# Patient Record
Sex: Female | Born: 1950 | Race: White | Hispanic: No | Marital: Married | Smoking: Never smoker
Health system: Southern US, Community
[De-identification: ages and names within clinical notes are randomized; demographics above are authoritative.]

## PROBLEM LIST (undated history)

## (undated) DIAGNOSIS — F419 Anxiety disorder, unspecified: Secondary | ICD-10-CM

## (undated) DIAGNOSIS — R112 Nausea with vomiting, unspecified: Secondary | ICD-10-CM

## (undated) DIAGNOSIS — Z9889 Other specified postprocedural states: Secondary | ICD-10-CM

## (undated) DIAGNOSIS — H269 Unspecified cataract: Secondary | ICD-10-CM

## (undated) DIAGNOSIS — N979 Female infertility, unspecified: Secondary | ICD-10-CM

## (undated) DIAGNOSIS — M170 Bilateral primary osteoarthritis of knee: Secondary | ICD-10-CM

## (undated) DIAGNOSIS — T7840XA Allergy, unspecified, initial encounter: Secondary | ICD-10-CM

## (undated) DIAGNOSIS — N950 Postmenopausal bleeding: Secondary | ICD-10-CM

## (undated) HISTORY — DX: Unspecified cataract: H26.9

## (undated) HISTORY — DX: Anxiety disorder, unspecified: F41.9

## (undated) HISTORY — DX: Female infertility, unspecified: N97.9

## (undated) HISTORY — DX: Nausea with vomiting, unspecified: R11.2

## (undated) HISTORY — DX: Nausea with vomiting, unspecified: Z98.890

## (undated) HISTORY — PX: ENDOMETRIAL BIOPSY: SHX622

## (undated) HISTORY — DX: Bilateral primary osteoarthritis of knee: M17.0

## (undated) HISTORY — DX: Postmenopausal bleeding: N95.0

## (undated) HISTORY — DX: Allergy, unspecified, initial encounter: T78.40XA

---

## 1996-09-05 HISTORY — PX: FOOT SURGERY: SHX648

## 1999-08-06 HISTORY — PX: REFRACTIVE SURGERY: SHX103

## 1999-10-06 ENCOUNTER — Other Ambulatory Visit: Admission: RE | Admit: 1999-10-06 | Discharge: 1999-10-06 | Payer: Self-pay | Admitting: *Deleted

## 2001-02-09 ENCOUNTER — Other Ambulatory Visit: Admission: RE | Admit: 2001-02-09 | Discharge: 2001-02-09 | Payer: Self-pay | Admitting: Family Medicine

## 2003-01-21 ENCOUNTER — Other Ambulatory Visit: Admission: RE | Admit: 2003-01-21 | Discharge: 2003-01-21 | Payer: Self-pay | Admitting: Family Medicine

## 2004-03-24 ENCOUNTER — Other Ambulatory Visit: Admission: RE | Admit: 2004-03-24 | Discharge: 2004-03-24 | Payer: Self-pay | Admitting: Obstetrics and Gynecology

## 2004-06-24 ENCOUNTER — Ambulatory Visit: Payer: Self-pay

## 2004-09-05 HISTORY — PX: FACIAL COSMETIC SURGERY: SHX629

## 2004-11-12 ENCOUNTER — Ambulatory Visit: Payer: Self-pay | Admitting: Family Medicine

## 2004-11-18 ENCOUNTER — Ambulatory Visit: Payer: Self-pay | Admitting: Otolaryngology

## 2005-03-15 ENCOUNTER — Ambulatory Visit: Payer: Self-pay | Admitting: Family Medicine

## 2005-04-05 ENCOUNTER — Ambulatory Visit: Payer: Self-pay | Admitting: Family Medicine

## 2005-07-29 ENCOUNTER — Ambulatory Visit: Payer: Self-pay | Admitting: Family Medicine

## 2006-05-01 ENCOUNTER — Ambulatory Visit: Payer: Self-pay | Admitting: Family Medicine

## 2006-05-10 ENCOUNTER — Encounter: Payer: Self-pay | Admitting: Family Medicine

## 2006-05-10 ENCOUNTER — Ambulatory Visit: Payer: Self-pay | Admitting: Family Medicine

## 2006-05-10 ENCOUNTER — Other Ambulatory Visit: Admission: RE | Admit: 2006-05-10 | Discharge: 2006-05-10 | Payer: Self-pay | Admitting: Family Medicine

## 2006-05-10 LAB — CONVERTED CEMR LAB: Pap Smear: NORMAL

## 2006-07-18 ENCOUNTER — Ambulatory Visit: Payer: Self-pay | Admitting: Family Medicine

## 2006-08-01 ENCOUNTER — Ambulatory Visit: Payer: Self-pay | Admitting: Family Medicine

## 2006-10-03 LAB — FECAL OCCULT BLOOD, GUAIAC: Fecal Occult Blood: NEGATIVE

## 2006-10-04 ENCOUNTER — Ambulatory Visit: Payer: Self-pay | Admitting: Family Medicine

## 2006-10-12 ENCOUNTER — Ambulatory Visit: Payer: Self-pay | Admitting: Family Medicine

## 2007-04-17 ENCOUNTER — Encounter: Payer: Self-pay | Admitting: Family Medicine

## 2007-04-17 DIAGNOSIS — F411 Generalized anxiety disorder: Secondary | ICD-10-CM | POA: Insufficient documentation

## 2007-04-17 DIAGNOSIS — J309 Allergic rhinitis, unspecified: Secondary | ICD-10-CM | POA: Insufficient documentation

## 2007-04-17 DIAGNOSIS — M7730 Calcaneal spur, unspecified foot: Secondary | ICD-10-CM | POA: Insufficient documentation

## 2007-05-17 ENCOUNTER — Other Ambulatory Visit: Admission: RE | Admit: 2007-05-17 | Discharge: 2007-05-17 | Payer: Self-pay | Admitting: Family Medicine

## 2007-05-17 ENCOUNTER — Encounter: Payer: Self-pay | Admitting: Family Medicine

## 2007-05-17 ENCOUNTER — Ambulatory Visit: Payer: Self-pay | Admitting: Family Medicine

## 2007-05-18 LAB — CONVERTED CEMR LAB
BUN: 10 mg/dL (ref 6–23)
Basophils Relative: 0.5 % (ref 0.0–1.0)
Bilirubin, Direct: 0.1 mg/dL (ref 0.0–0.3)
CO2: 28 meq/L (ref 19–32)
Eosinophils Absolute: 0.2 10*3/uL (ref 0.0–0.6)
Eosinophils Relative: 2.3 % (ref 0.0–5.0)
GFR calc Af Amer: 111 mL/min
Glucose, Bld: 103 mg/dL — ABNORMAL HIGH (ref 70–99)
HDL: 38 mg/dL — ABNORMAL LOW (ref >39.0)
Hemoglobin: 14.1 g/dL (ref 12.0–15.0)
Lymphocytes Relative: 27.3 % (ref 12.0–46.0)
MCV: 92.5 fL (ref 78.0–100.0)
Monocytes Absolute: 0.4 10*3/uL (ref 0.2–0.7)
Monocytes Relative: 6.4 % (ref 3.0–11.0)
Neutro Abs: 4.4 10*3/uL (ref 1.4–7.7)
Potassium: 4.3 meq/L (ref 3.5–5.1)
Total Protein: 6.9 g/dL (ref 6.0–8.3)
VLDL: 21 mg/dL (ref 0–40)

## 2007-05-23 ENCOUNTER — Encounter (INDEPENDENT_AMBULATORY_CARE_PROVIDER_SITE_OTHER): Payer: Self-pay | Admitting: *Deleted

## 2007-05-23 LAB — CONVERTED CEMR LAB: Pap Smear: NORMAL

## 2007-08-03 ENCOUNTER — Ambulatory Visit: Payer: Self-pay | Admitting: Family Medicine

## 2007-08-03 ENCOUNTER — Encounter: Payer: Self-pay | Admitting: Family Medicine

## 2007-08-06 ENCOUNTER — Encounter (INDEPENDENT_AMBULATORY_CARE_PROVIDER_SITE_OTHER): Payer: Self-pay | Admitting: *Deleted

## 2008-03-05 LAB — CONVERTED CEMR LAB: Pap Smear: NORMAL

## 2008-03-17 ENCOUNTER — Telehealth: Payer: Self-pay | Admitting: Family Medicine

## 2008-06-06 ENCOUNTER — Ambulatory Visit: Payer: Self-pay | Admitting: Family Medicine

## 2008-06-06 DIAGNOSIS — R739 Hyperglycemia, unspecified: Secondary | ICD-10-CM

## 2008-06-06 DIAGNOSIS — E669 Obesity, unspecified: Secondary | ICD-10-CM | POA: Insufficient documentation

## 2008-06-06 DIAGNOSIS — Z6836 Body mass index (BMI) 36.0-36.9, adult: Secondary | ICD-10-CM

## 2008-06-06 DIAGNOSIS — L708 Other acne: Secondary | ICD-10-CM | POA: Insufficient documentation

## 2008-06-06 DIAGNOSIS — R7303 Prediabetes: Secondary | ICD-10-CM | POA: Insufficient documentation

## 2008-06-06 DIAGNOSIS — E6609 Other obesity due to excess calories: Secondary | ICD-10-CM | POA: Insufficient documentation

## 2008-06-10 LAB — CONVERTED CEMR LAB
AST: 18 units/L (ref 0–37)
Albumin: 4.1 g/dL (ref 3.5–5.2)
BUN: 12 mg/dL (ref 6–23)
Basophils Relative: 0.7 % (ref 0.0–3.0)
Chloride: 101 meq/L (ref 96–112)
Creatinine, Ser: 0.6 mg/dL (ref 0.4–1.2)
Eosinophils Absolute: 0.1 10*3/uL (ref 0.0–0.7)
Eosinophils Relative: 2 % (ref 0.0–5.0)
GFR calc Af Amer: 132 mL/min
GFR calc non Af Amer: 109 mL/min
HCT: 41.2 % (ref 36.0–46.0)
Hemoglobin: 14.8 g/dL (ref 12.0–15.0)
Hgb A1c MFr Bld: 6 % (ref 4.6–6.0)
MCV: 92 fL (ref 78.0–100.0)
Monocytes Absolute: 0.5 10*3/uL (ref 0.1–1.0)
Neutro Abs: 4.5 10*3/uL (ref 1.4–7.7)
RBC: 4.48 M/uL (ref 3.87–5.11)
WBC: 6.9 10*3/uL (ref 4.5–10.5)

## 2008-08-06 ENCOUNTER — Ambulatory Visit: Payer: Self-pay | Admitting: Family Medicine

## 2008-08-06 ENCOUNTER — Encounter: Payer: Self-pay | Admitting: Family Medicine

## 2008-08-08 ENCOUNTER — Encounter: Payer: Self-pay | Admitting: Family Medicine

## 2008-08-12 ENCOUNTER — Encounter (INDEPENDENT_AMBULATORY_CARE_PROVIDER_SITE_OTHER): Payer: Self-pay | Admitting: *Deleted

## 2008-09-05 HISTORY — PX: COLONOSCOPY: SHX174

## 2008-10-06 ENCOUNTER — Ambulatory Visit: Payer: Self-pay | Admitting: Gastroenterology

## 2008-10-06 ENCOUNTER — Encounter: Payer: Self-pay | Admitting: Family Medicine

## 2009-05-06 ENCOUNTER — Ambulatory Visit: Payer: Self-pay | Admitting: Family Medicine

## 2009-05-07 LAB — CONVERTED CEMR LAB
Albumin: 4.3 g/dL (ref 3.5–5.2)
Basophils Relative: 0.7 % (ref 0.0–3.0)
CO2: 29 meq/L (ref 19–32)
Chloride: 106 meq/L (ref 96–112)
Creatinine, Ser: 0.7 mg/dL (ref 0.4–1.2)
Eosinophils Absolute: 0.3 10*3/uL (ref 0.0–0.7)
HCT: 41.7 % (ref 36.0–46.0)
Hemoglobin: 14.6 g/dL (ref 12.0–15.0)
Hgb A1c MFr Bld: 5.8 % (ref 4.6–6.5)
Lymphs Abs: 1.9 10*3/uL (ref 0.7–4.0)
MCHC: 34.9 g/dL (ref 30.0–36.0)
MCV: 93.3 fL (ref 78.0–100.0)
Monocytes Absolute: 0.4 10*3/uL (ref 0.1–1.0)
Neutro Abs: 4.5 10*3/uL (ref 1.4–7.7)
RBC: 4.47 M/uL (ref 3.87–5.11)
Sodium: 142 meq/L (ref 135–145)
Total CHOL/HDL Ratio: 5
Total Protein: 7.2 g/dL (ref 6.0–8.3)
Triglycerides: 130 mg/dL (ref 0.0–149.0)
Vit D, 25-Hydroxy: 30 ng/mL (ref 30–89)

## 2009-05-18 ENCOUNTER — Ambulatory Visit: Payer: Self-pay | Admitting: Family Medicine

## 2009-05-28 ENCOUNTER — Telehealth: Payer: Self-pay | Admitting: Family Medicine

## 2009-08-10 ENCOUNTER — Ambulatory Visit: Payer: Self-pay | Admitting: Family Medicine

## 2009-08-10 ENCOUNTER — Encounter: Payer: Self-pay | Admitting: Family Medicine

## 2009-08-14 ENCOUNTER — Encounter (INDEPENDENT_AMBULATORY_CARE_PROVIDER_SITE_OTHER): Payer: Self-pay | Admitting: *Deleted

## 2009-09-05 HISTORY — PX: OTHER SURGICAL HISTORY: SHX169

## 2010-06-23 ENCOUNTER — Ambulatory Visit: Payer: Self-pay | Admitting: Ophthalmology

## 2010-07-07 ENCOUNTER — Encounter: Payer: Self-pay | Admitting: Family Medicine

## 2010-07-07 ENCOUNTER — Telehealth (INDEPENDENT_AMBULATORY_CARE_PROVIDER_SITE_OTHER): Payer: Self-pay | Admitting: *Deleted

## 2010-07-07 DIAGNOSIS — R5381 Other malaise: Secondary | ICD-10-CM | POA: Insufficient documentation

## 2010-07-07 DIAGNOSIS — R5383 Other fatigue: Secondary | ICD-10-CM | POA: Insufficient documentation

## 2010-07-07 LAB — CONVERTED CEMR LAB
AST: 19 units/L (ref 0–37)
Albumin: 4.2 g/dL (ref 3.5–5.2)
Alkaline Phosphatase: 75 units/L (ref 39–117)
BUN: 13 mg/dL (ref 6–23)
Basophils Absolute: 0 10*3/uL (ref 0.0–0.1)
CO2: 29 meq/L (ref 19–32)
Calcium: 9.5 mg/dL (ref 8.4–10.5)
Creatinine, Ser: 0.7 mg/dL (ref 0.4–1.2)
Eosinophils Absolute: 0.2 10*3/uL (ref 0.0–0.7)
GFR calc non Af Amer: 88.71 mL/min (ref >60)
Glucose, Bld: 95 mg/dL (ref 70–99)
HDL: 43.1 mg/dL (ref >39.00)
Lymphocytes Relative: 29.3 % (ref 12.0–46.0)
MCHC: 34.1 g/dL (ref 30.0–36.0)
Monocytes Relative: 7.7 % (ref 3.0–12.0)
Neutro Abs: 3.9 10*3/uL (ref 1.4–7.7)
Neutrophils Relative %: 60.1 % (ref 43.0–77.0)
Platelets: 241 10*3/uL (ref 150.0–400.0)
RDW: 12.8 % (ref 11.5–14.6)
Total Bilirubin: 0.7 mg/dL (ref 0.3–1.2)
Triglycerides: 188 mg/dL — ABNORMAL HIGH (ref 0.0–149.0)
VLDL: 37.6 mg/dL (ref 0.0–40.0)
Vitamin B-12: 438 pg/mL (ref 211–911)

## 2010-07-08 LAB — CONVERTED CEMR LAB: Vit D, 25-Hydroxy: 40 ng/mL (ref 30–89)

## 2010-07-14 ENCOUNTER — Ambulatory Visit: Payer: Self-pay | Admitting: Family Medicine

## 2010-07-19 ENCOUNTER — Telehealth: Payer: Self-pay | Admitting: Family Medicine

## 2010-07-20 ENCOUNTER — Encounter: Payer: Self-pay | Admitting: Family Medicine

## 2010-08-20 ENCOUNTER — Ambulatory Visit: Payer: Self-pay | Admitting: Family Medicine

## 2010-08-20 ENCOUNTER — Encounter: Payer: Self-pay | Admitting: Family Medicine

## 2010-08-20 LAB — HM MAMMOGRAPHY: HM Mammogram: NORMAL

## 2010-08-23 ENCOUNTER — Encounter: Payer: Self-pay | Admitting: Family Medicine

## 2010-10-05 NOTE — Progress Notes (Signed)
Summary: insurance wont cover tretinoin  Phone Note From Pharmacy   [facility]  [address] [id number]  [employer] Summary of Call: Per [employer], pt's insurance will not cover tretinoin in pt's over 62 years old.  There is no option for a prior auth.  Do you want to change to something else? Initial call taken by: Lowella Petties CMA, AAMA,  July 19, 2010 12:27 PM  Follow-up for Phone Call        I just signed a paper on my desk -- giving phone number to call for prior auth form--I will put it in IN box  let me know  Follow-up by: Judith Part MD,  July 19, 2010 1:34 PM  Additional Follow-up for Phone Call Additional follow up Details #1::        I called [employer] [phone] and spoke with Olegario Messier. She gave me case # [id number] and will fax prior authorization to Dr Milinda Antis.Lewanda Rife LPN  July 19, 2010 3:15 PM   Prior Berkley Harvey form is on your shelf.          Lowella Petties CMA, AAMA  July 19, 2010 4:48 PM     Additional Follow-up for Phone Call Additional follow up Details #2::    form done and in nurse in box  Follow-up by: Judith Part MD,  July 19, 2010 4:51 PM  Additional Follow-up for Phone Call Additional follow up Details #3:: Details for Additional Follow-up Action Taken: Form faxed.      Lowella Petties CMA, AAMA  July 20, 2010 8:27 AM  Prior Berkley Harvey given for tretinoin, approval letter is on your shelf for signature and scanning.  Lowella Petties CMA, AAMA  July 21, 2010 8:24 AM

## 2010-10-05 NOTE — Medication Information (Signed)
Summary: PA and Approval for Tretinoin Cream  PA and Approval for Tretinoin Cream   Imported By: Maryln Gottron 07/30/2010 09:16:37  _____________________________________________________________________  External Attachment:    Type:   Image     Comment:   External Document

## 2010-10-05 NOTE — Progress Notes (Signed)
----   Converted from flag ---- ---- 07/06/2010 7:38 PM, Judith Part MD wrote: please check wellness and lipid and AIC for v70.0 and hyperglycemia - thanks  ---- 07/06/2010 3:42 PM, Mills Koller wrote: This patient is scheduled for CPX with you, I need lab orders with dx, please. Thanks, Terri  labs on Wednesday ------------------------------

## 2010-10-05 NOTE — Assessment & Plan Note (Signed)
Summary: [name]   Vital Signs:  Patient profile:   61 year old female Height:      64 inches Weight:      223.25 pounds BMI:     38.46 Temp:     98.3 degrees F oral Pulse rate:   88 / minute Pulse rhythm:   regular BP sitting:   110 / 70  (left arm) Cuff size:   large  Vitals Entered By: Lewanda Rife LPN (July 14, 2010 2:30 PM) CC: CPX GYN did pap and breast exam   History of Present Illness: here for wellness exam and to review chronic med problems   has been feeling pretty good except for some fatigue  ? if age related or other  labs are ok  does not exercise     wt is up 6 lb with bmi of 38   hx of neg endom bx pap- was this summer july -- was normal  still on the prometrium  mam 12/10  self exam - no lumps at all  Dr Genice Rouge checked breasts too  is on 50,000 vit D weekly   labs  lipids stable with LDL 114  AIC 6.1 (up from 5.8 B12 nl  vit D is 40 other labs nl   nl dexa 07-- that was normal  ca and D--  takes it most of the time   colonosc 2/10 nl  Td04 does not take flu shots   had shingles in the past (no vaccine) -- within 10 years      Allergies (verified): No Known Drug Allergies  Past History:  Past Medical History: Last updated: 10/11/2008 Allergic rhinitis Anxiety post menup bleeding - neg endo bx   GYN- Dr Genice Rouge  GI- Dr Ricki Rodriguez  Past Surgical History: Last updated: 10/11/2008 Caesarean section x 2 Heel spur removal right foot (1998) Laser surgery to eyes (08/1999) Dexa- normal (02/2001) Face lift (2006) Dexa- normal (07/2006) endo bx - neg for ca cells - gyn 2/10 colonoscopy- int hem- re check 10 years  Family History: Last updated: 06/06/2008 Father: Alz Mother: CVA, HTN. OP Siblings: 3 sisters, 2 brothers- one sister with depression and OA, 1 brother with OA and knee replacement sister with TIAs  brother COPD-- smoker  GM stomach ca sister parkinson's sister dementia, bipolar, cva  Social  History: Last updated: 06/06/2008 Marital Status: married Children: 2 Occupation: retired never smoked  occ alcohol   Risk Factors: Smoking Status: never (04/17/2007)  Review of Systems General:  Complains of fatigue; denies chills, fever, loss of appetite, and malaise. Eyes:  Denies blurring and eye irritation. CV:  Denies chest pain or discomfort, palpitations, shortness of breath with exertion, and swelling of feet. Resp:  Denies cough, shortness of breath, and wheezing. GI:  Denies abdominal pain, change in bowel habits, indigestion, and nausea. GU:  Denies dysuria and urinary frequency. MS:  Denies joint pain, joint redness, joint swelling, and cramps. Derm:  Denies itching, lesion(s), poor wound healing, and rash. Neuro:  Denies numbness and tingling. Psych:  Denies anxiety and depression. Endo:  Denies cold intolerance, excessive thirst, excessive urination, and heat intolerance. Heme:  Denies abnormal bruising and bleeding.  Physical Exam  General:  overwt but well appearing Head:  normocephalic, atraumatic, and no abnormalities observed.   Eyes:  vision grossly intact, pupils equal, pupils round, and pupils reactive to light.  no conjunctival pallor, injection or icterus  Ears:  R ear normal.   Nose:  no nasal discharge.  Mouth:  pharynx pink and moist.   Neck:  supple with full rom and no masses or thyromegally, no JVD or carotid bruit  Chest Wall:  No deformities, masses, or tenderness noted. Lungs:  Normal respiratory effort, chest expands symmetrically. Lungs are clear to auscultation, no crackles or wheezes. Heart:  Normal rate and regular rhythm. S1 and S2 normal without gallop, murmur, click, rub or other extra sounds. Abdomen:  Bowel sounds positive,abdomen soft and non-tender without masses, organomegaly or hernias noted.  no renal bruits  Msk:  No deformity or scoliosis noted of thoracic or lumbar spine.  no acute joint changes, full rom of all joints   Pulses:  R and L carotid,radial,femoral,dorsalis pedis and posterior tibial pulses are full and equal bilaterally Extremities:  No clubbing, cyanosis, edema, or deformity noted with normal full range of motion of all joints.   Neurologic:  sensation intact to light touch, gait normal, and DTRs symmetrical and normal.   Skin:  Intact without suspicious lesions or rashes Cervical Nodes:  No lymphadenopathy noted Inguinal Nodes:  No significant adenopathy Psych:  normal affect, talkative and pleasant    Impression & Recommendations:  Problem # 1:  HEALTH MAINTENANCE EXAM (ICD-V70.0) Assessment Comment Only  reviewed health habits including diet, exercise and skin cancer prevention reviewed health maintenance list and family history  disc fatigue in light of need to exercise   Orders: Prescription Created Electronically [phone])  Problem # 2:  ACNE, MILD (ICD-706.1) Assessment: Unchanged  refilled retin A Her updated medication list for this problem includes:    Retin-a 0.025 % Crea (Tretinoin) .Marland Kitchen... Apply to affected area once daily to dry skin - small amount  Orders: Prescription Created Electronically [phone])  Problem # 3:  HYPERGLYCEMIA (ICD-790.29) Assessment: Deteriorated  this is slt worse with obesity and lack of exercise  disc need for wt loss and low glycemic diet   Labs Reviewed: Creat: 0.7 (07/07/2010)     Orders: Prescription Created Electronically [phone])  Complete Medication List: 1)  Ventolin Hfa 108 (90 Base) Mcg/act Aers (Albuterol sulfate) .... 2 puffs up to every 4 hours as needed wheezing 2)  Zyrtec Allergy 10 Mg Tabs (Cetirizine hcl) .... One by mouth dialy prn 3)  Fluoxetine Hcl 40 Mg Caps (Fluoxetine hcl) .Marland Kitchen.. 1 by mouth once daily 4)  Flonase 50 Mcg/act Susp (Fluticasone propionate) .... Use as directed 2 sprays in each nostril once daily 5)  Prometrium 200 Mg Caps (Progesterone micronized) .... One tab every night for 10 days every 3 months (dr  Genice Rouge) 6)  Retin-a 0.025 % Crea (Tretinoin) .... Apply to affected area once daily to dry skin - small amount  Other Orders: Radiology Referral (Radiology)  Anticoagulation Management Assessment/Plan:             Patient Instructions: 1)  when you are done with your high dose weekly vitamin D-- then take 2000 international units daily of vitamin D3 over the counter 2)  if you are interested in shingles vaccine - call your insurance co first  3)  I will send your records to Dr Genice Rouge  4)  we will schedule mammogram at check out  5)  It is important that you exercise reguarly at least 20 minutes 5 times a week. If you develop chest pain, have severe difficulty breathing, or feel very tired, stop exercising immediately and seek medical attention.  6)  weight loss will help sugar down and reduce risk of diabetes  Prescriptions: RETIN-A 0.025 % CREA (  TRETINOIN) apply to affected area once daily to dry skin - small amount  #1 medium x 2   Entered and Authorized by:   Judith Part MD   Signed by:   Judith Part MD on 07/14/2010   Method used:   [facility]        [phone][facility] (retail)       [address] [place], [place]  [address]       Ph: [phone] or [phone]       Fax: [phone]   RxID:   [id number] FLUOXETINE HCL 40 MG CAPS (FLUOXETINE HCL) 1 by mouth once daily  #90 x 3   Entered and Authorized by:   Judith Part MD   Signed by:   Judith Part MD on 07/14/2010   Method used:   [facility]ronically to        [facility] [phone]* (retail)       [address] [place], [place]  [address]       Ph: [phone] or [phone]       Fax: [phone]   RxID:   [id number]    Orders Added: 1)  Radiology Referral [Radiology] 2)  Est. Patient 40-64 years [id number] 3)  Prescription Created Electronically [phone]    Current Allergies (reviewed today): No known allergies

## 2010-10-07 NOTE — Letter (Signed)
Summary: Results Follow up Letter  [place] [facility]  [address] [place], [place] [address]   Phone: [phone]  Fax: [phone]    08/23/2010 MRN: [id number]    REIANNA BATDORF [address] [place], [place]  [address]    Dear Ms. Pettus,  The following are the results of your recent test(s):  Test         Result    Pap Smear:        Normal _____  Not Normal _____ Comments: ______________________________________________________ Cholesterol: LDL(Bad cholesterol):         Your goal is less than:         HDL (Good cholesterol):       Your goal is more than: Comments:  ______________________________________________________ Mammogram:        Normal ___X__  Not Normal _____ Comments:Repeat in one year.   ___________________________________________________________________ Hemoccult:        Normal _____  Not normal _______ Comments:    _____________________________________________________________________ Other Tests:    We routinely do not discuss normal results over the telephone.  If you desire a copy of the results, or you have any questions about this information we can discuss them at your next office visit.   Sincerely,    Idamae Schuller Nyquan Selbe,MD  MT/ri

## 2011-06-22 ENCOUNTER — Other Ambulatory Visit: Payer: Self-pay | Admitting: *Deleted

## 2011-06-22 MED ORDER — FLUOXETINE HCL 40 MG PO CAPS: 40.0000 mg | ORAL_CAPSULE | Freq: Every day | ORAL | Status: DC

## 2011-06-22 NOTE — Telephone Encounter (Signed)
Could not find her pharmacy to plug in so Px written for call in  -thanks

## 2011-06-22 NOTE — Telephone Encounter (Signed)
Pt is asking for enough refills to last until her appt on 1/22.  [facility] [address]

## 2011-06-22 NOTE — Telephone Encounter (Signed)
Medication [facility] as instructed.Patient notified as instructed by telephone.

## 2011-07-26 ENCOUNTER — Other Ambulatory Visit: Payer: Self-pay

## 2011-07-26 MED ORDER — FLUTICASONE PROPIONATE 50 MCG/ACT NA SUSP: NASAL | Status: DC

## 2011-07-26 NOTE — Telephone Encounter (Signed)
[facility] [address] [facility] refill request Fluticasone prop 50 mcg #16 gm 0. Pt already scheduled appt 07/28/11.

## 2011-09-19 ENCOUNTER — Telehealth (INDEPENDENT_AMBULATORY_CARE_PROVIDER_SITE_OTHER): Payer: BC Managed Care – PPO | Admitting: Family Medicine

## 2011-09-19 DIAGNOSIS — Z0000 Encounter for general adult medical examination without abnormal findings: Secondary | ICD-10-CM

## 2011-09-19 DIAGNOSIS — R7309 Other abnormal glucose: Secondary | ICD-10-CM

## 2011-09-19 NOTE — Telephone Encounter (Signed)
Message copied by Judy Pimple on Mon Sep 19, 2011  8:48 PM ------      Message from: Alvina Chou      Created: Mon Sep 19, 2011  8:32 AM      Regarding: labs for 09-20-11       Patient is scheduled for CPX labs, please order future labs, Thanks , Camelia Eng

## 2011-09-20 ENCOUNTER — Other Ambulatory Visit: Payer: BC Managed Care – PPO

## 2011-09-20 LAB — CBC WITH DIFFERENTIAL/PLATELET
Eosinophils Relative: 2.5 % (ref 0.0–5.0)
HCT: 39.8 % (ref 36.0–46.0)
Hemoglobin: 14 g/dL (ref 12.0–15.0)
Lymphocytes Relative: 22.4 % (ref 12.0–46.0)
Lymphs Abs: 1.8 10*3/uL (ref 0.7–4.0)
Monocytes Relative: 6.6 % (ref 3.0–12.0)
Platelets: 258 10*3/uL (ref 150.0–400.0)
WBC: 8.1 10*3/uL (ref 4.5–10.5)

## 2011-09-20 LAB — COMPREHENSIVE METABOLIC PANEL
Albumin: 4.2 g/dL (ref 3.5–5.2)
CO2: 25 mEq/L (ref 19–32)
Calcium: 9.1 mg/dL (ref 8.4–10.5)
Chloride: 103 mEq/L (ref 96–112)
GFR: 103.32 mL/min (ref >60.00)
Glucose, Bld: 101 mg/dL — ABNORMAL HIGH (ref 70–99)
Sodium: 138 mEq/L (ref 135–145)
Total Bilirubin: 0.6 mg/dL (ref 0.3–1.2)
Total Protein: 7.1 g/dL (ref 6.0–8.3)

## 2011-09-20 LAB — TSH: TSH: 1.26 u[IU]/mL (ref 0.35–5.50)

## 2011-09-20 LAB — LIPID PANEL
LDL Cholesterol: 105 mg/dL — ABNORMAL HIGH (ref 0–99)
Total CHOL/HDL Ratio: 4
Triglycerides: 95 mg/dL (ref 0.0–149.0)

## 2011-09-26 ENCOUNTER — Encounter: Payer: Self-pay | Admitting: Family Medicine

## 2011-09-27 ENCOUNTER — Encounter: Payer: Self-pay | Admitting: Family Medicine

## 2011-09-27 ENCOUNTER — Ambulatory Visit (INDEPENDENT_AMBULATORY_CARE_PROVIDER_SITE_OTHER): Payer: BC Managed Care – PPO | Admitting: Family Medicine

## 2011-09-27 VITALS — BP 138/80 | HR 68 | Temp 98.2°F | Ht 64.0 in | Wt 216.2 lb

## 2011-09-27 DIAGNOSIS — Z1231 Encounter for screening mammogram for malignant neoplasm of breast: Secondary | ICD-10-CM | POA: Insufficient documentation

## 2011-09-27 DIAGNOSIS — Z23 Encounter for immunization: Secondary | ICD-10-CM

## 2011-09-27 DIAGNOSIS — E669 Obesity, unspecified: Secondary | ICD-10-CM

## 2011-09-27 DIAGNOSIS — Z0000 Encounter for general adult medical examination without abnormal findings: Secondary | ICD-10-CM

## 2011-09-27 DIAGNOSIS — R7309 Other abnormal glucose: Secondary | ICD-10-CM

## 2011-09-27 MED ORDER — FLUTICASONE PROPIONATE 50 MCG/ACT NA SUSP: NASAL | Status: DC

## 2011-09-27 MED ORDER — ALBUTEROL SULFATE HFA 108 (90 BASE) MCG/ACT IN AERS: 2.0000 | INHALATION_SPRAY | RESPIRATORY_TRACT | Status: DC | PRN

## 2011-09-27 MED ORDER — TRETINOIN 0.025 % EX CREA: TOPICAL_CREAM | Freq: Every day | CUTANEOUS | Status: DC

## 2011-09-27 MED ORDER — FLUOXETINE HCL 40 MG PO CAPS: 40.0000 mg | ORAL_CAPSULE | Freq: Every day | ORAL | Status: DC

## 2011-09-27 NOTE — Assessment & Plan Note (Signed)
a1c stable at 6.0- suspect wt loss and exercise will lower it further Commended

## 2011-09-27 NOTE — Assessment & Plan Note (Signed)
Doing weight watchers and using gym with trainer 10 lb so far Commended Enc to keep going  Disc health benefits of wt loss

## 2011-09-27 NOTE — Patient Instructions (Signed)
If you are interested in a shingles/zoster vaccine - call your insurance to check on coverage,( you should not get it within 1 month of other vaccines) , then call us for a prescription  for it to take to a pharmacy that gives the shot or call to schedule it here We will schedule mammogram at check out Flu shot today

## 2011-09-27 NOTE — Assessment & Plan Note (Signed)
Reviewed health habits including diet and exercise and skin cancer prevention Also reviewed health mt list, fam hx and immunizations  Flu shot today Enc to consider zostavax also

## 2011-09-27 NOTE — Assessment & Plan Note (Signed)
sched mammo Enc monthly self exams and yearly f/u with her gyn

## 2011-09-27 NOTE — Progress Notes (Signed)
Subjective:    Patient ID: Deborah Carey, female    DOB: 04-21-1951, 61 y.o.   MRN: [id number]  HPI Here for health maintenance exam and to review chronic medical problems   Is feeling ok in general  Has osteoarthritis in her knees  Not close to needing knee replacement  Naproxen prn  Sees orthopedics   Obesity - wt is down 7 lb with bmi of 37-- all total lost 10 lb with wt watchers  Diet  Wt watchers  Exercise- working with trainer at [employer]   bp 138/80- borderline  Sees gyn- last exam pap in July  No more bleeding - is off the prometrium   Mam 12/11-needs to get one  Goes to [facility]  No lumps on exam at home or with gyn   Zoster status-- had shingles -- 8 years ago    Flu shot - did not get  Not allergic  Needs to get one   colonosc 2/10- 10 year follow up   dexa 07- normal   Hyperglycemia is ok 6.0 a1c Is staying away from sweets    Lab Results  Component Value Date   CHOL 172 09/20/2011   HDL 48.10 09/20/2011   LDLCALC 105* 09/20/2011   TRIG 95.0 09/20/2011   CHOLHDL 4 09/20/2011   pretty good  Patient Active Problem List  Diagnoses  . OBESITY  . ANXIETY  . ALLERGIC RHINITIS  . ACNE, MILD  . HEEL SPUR  . FATIGUE  . HYPERGLYCEMIA  . Routine general medical examination at a health care facility  . Other screening mammogram   Past Medical History  Diagnosis Date  . Allergy     allergic rhinitis  . Anxiety   . Post-menopausal bleeding     neg endo bx.   Past Surgical History  Procedure Date  . Cesarean section   . Foot surgery 1998    rt heel spur removal  . Refractive surgery 08/1999  . Facial cosmetic surgery 2006  . Endometrial biopsy     neg for CA cells   History  Substance Use Topics  . Smoking status: Never Smoker   . Smokeless tobacco: Not on file  . Alcohol Use: Yes     occasional alcohol   Family History  Problem Relation Age of Onset  . Stroke Mother   . Hypertension Mother   . Alzheimer's disease Father   . Depression  Sister   . COPD Brother    No Known Allergies No current outpatient prescriptions on file prior to visit.      Review of Systems Review of Systems  Constitutional: Negative for fever, appetite change, fatigue and unexpected weight change.  Eyes: Negative for pain and visual disturbance.  Respiratory: Negative for cough and shortness of breath.   Cardiovascular: Negative for cp or palpitations    Gastrointestinal: Negative for nausea, diarrhea and constipation.  Genitourinary: Negative for urgency and frequency.  Skin: Negative for pallor or rash   Neurological: Negative for weakness, light-headedness, numbness and headaches.  Hematological: Negative for adenopathy. Does not bruise/bleed easily.  Psychiatric/Behavioral: Negative for dysphoric mood. The patient is not nervous/anxious.          Objective:   Physical Exam  Constitutional: She appears well-developed and well-nourished. No distress.       Obese and well appearing   HENT:  Head: Normocephalic and atraumatic.  Right Ear: External ear normal.  Left Ear: External ear normal.  Nose: Nose normal.  Mouth/Throat: Oropharynx is clear  and moist.  Eyes: Conjunctivae and EOM are normal. Pupils are equal, round, and reactive to light. No scleral icterus.  Neck: Normal range of motion. Neck supple. No JVD present. Carotid bruit is not present. No thyromegaly present.  Cardiovascular: Normal rate, regular rhythm, normal heart sounds and intact distal pulses.   Pulmonary/Chest: Effort normal and breath sounds normal. No respiratory distress. She has no wheezes.  Abdominal: Soft. Bowel sounds are normal. She exhibits no distension, no abdominal bruit and no mass. There is no tenderness.  Musculoskeletal: Normal range of motion. She exhibits no edema and no tenderness.  Lymphadenopathy:    She has no cervical adenopathy.  Neurological: She is alert. She has normal reflexes. No cranial nerve deficit. She exhibits normal muscle tone.  Coordination normal.  Skin: Skin is warm and dry. No rash noted. No erythema. No pallor.  Psychiatric: She has a normal mood and affect.          Assessment & Plan:

## 2011-11-01 ENCOUNTER — Ambulatory Visit: Payer: Self-pay | Admitting: Family Medicine

## 2011-11-01 LAB — HM MAMMOGRAPHY: HM Mammogram: NORMAL

## 2011-11-02 ENCOUNTER — Encounter: Payer: Self-pay | Admitting: Family Medicine

## 2011-11-03 ENCOUNTER — Encounter: Payer: Self-pay | Admitting: Family Medicine

## 2012-10-15 ENCOUNTER — Other Ambulatory Visit: Payer: Self-pay | Admitting: *Deleted

## 2012-10-15 MED ORDER — FLUOXETINE HCL 40 MG PO CAPS: 40.0000 mg | ORAL_CAPSULE | Freq: Every day | ORAL | Status: DC

## 2012-10-16 ENCOUNTER — Other Ambulatory Visit: Payer: Self-pay | Admitting: *Deleted

## 2012-10-31 ENCOUNTER — Other Ambulatory Visit (INDEPENDENT_AMBULATORY_CARE_PROVIDER_SITE_OTHER): Payer: BC Managed Care – PPO

## 2012-10-31 ENCOUNTER — Telehealth: Payer: Self-pay | Admitting: Family Medicine

## 2012-10-31 DIAGNOSIS — Z0000 Encounter for general adult medical examination without abnormal findings: Secondary | ICD-10-CM

## 2012-10-31 DIAGNOSIS — R7309 Other abnormal glucose: Secondary | ICD-10-CM

## 2012-10-31 LAB — COMPREHENSIVE METABOLIC PANEL
ALT: 22 U/L (ref 0–35)
Albumin: 4.1 g/dL (ref 3.5–5.2)
CO2: 28 mEq/L (ref 19–32)
GFR: 89.5 mL/min (ref >60.00)
Glucose, Bld: 104 mg/dL — ABNORMAL HIGH (ref 70–99)
Potassium: 4.5 mEq/L (ref 3.5–5.1)
Sodium: 138 mEq/L (ref 135–145)
Total Protein: 7.1 g/dL (ref 6.0–8.3)

## 2012-10-31 LAB — CBC WITH DIFFERENTIAL/PLATELET
Basophils Absolute: 0 10*3/uL (ref 0.0–0.1)
Eosinophils Relative: 2.5 % (ref 0.0–5.0)
HCT: 40.9 % (ref 36.0–46.0)
Lymphocytes Relative: 22.6 % (ref 12.0–46.0)
Monocytes Relative: 6.7 % (ref 3.0–12.0)
Neutrophils Relative %: 67.6 % (ref 43.0–77.0)
Platelets: 258 10*3/uL (ref 150.0–400.0)
WBC: 7.4 10*3/uL (ref 4.5–10.5)

## 2012-10-31 LAB — HEMOGLOBIN A1C: Hgb A1c MFr Bld: 5.8 % (ref 4.6–6.5)

## 2012-10-31 LAB — LIPID PANEL
HDL: 46.8 mg/dL (ref >39.00)
LDL Cholesterol: 116 mg/dL — ABNORMAL HIGH (ref 0–99)
Total CHOL/HDL Ratio: 4
VLDL: 16.8 mg/dL (ref 0.0–40.0)

## 2012-10-31 LAB — TSH: TSH: 1.22 u[IU]/mL (ref 0.35–5.50)

## 2012-10-31 NOTE — Telephone Encounter (Signed)
Lab order

## 2012-11-05 ENCOUNTER — Ambulatory Visit (INDEPENDENT_AMBULATORY_CARE_PROVIDER_SITE_OTHER): Payer: BC Managed Care – PPO | Admitting: Family Medicine

## 2012-11-05 ENCOUNTER — Encounter: Payer: Self-pay | Admitting: Family Medicine

## 2012-11-05 VITALS — BP 124/76 | HR 78 | Temp 98.8°F | Ht 64.25 in | Wt 213.8 lb

## 2012-11-05 DIAGNOSIS — M1710 Unilateral primary osteoarthritis, unspecified knee: Secondary | ICD-10-CM

## 2012-11-05 DIAGNOSIS — M170 Bilateral primary osteoarthritis of knee: Secondary | ICD-10-CM | POA: Insufficient documentation

## 2012-11-05 DIAGNOSIS — Z23 Encounter for immunization: Secondary | ICD-10-CM

## 2012-11-05 DIAGNOSIS — R7309 Other abnormal glucose: Secondary | ICD-10-CM

## 2012-11-05 DIAGNOSIS — Z0000 Encounter for general adult medical examination without abnormal findings: Secondary | ICD-10-CM

## 2012-11-05 MED ORDER — FLUTICASONE PROPIONATE 50 MCG/ACT NA SUSP: 2.0000 | NASAL | Status: DC | PRN

## 2012-11-05 MED ORDER — FLUOXETINE HCL 40 MG PO CAPS: 40.0000 mg | ORAL_CAPSULE | Freq: Every day | ORAL | Status: DC

## 2012-11-05 NOTE — Assessment & Plan Note (Signed)
Reviewed health habits including diet and exercise and skin cancer prevention Also reviewed health mt list, fam hx and immunizations  Rev wellness labs Pt will schedule own mammo Has gyn care in July Tdap today Will call ins about zostavax

## 2012-11-05 NOTE — Assessment & Plan Note (Signed)
a1c is 5.8-continue to watch Disc imp of low sugar diet / wt loss and low impact exercise

## 2012-11-05 NOTE — Assessment & Plan Note (Signed)
Pt seeing orthopedics Given handicapped form for sticker

## 2012-11-05 NOTE — Progress Notes (Signed)
Subjective:    Patient ID: Deborah Carey, female    DOB: 12-20-1950, 62 y.o.   MRN: [id number]  HPI Here for health maintenance exam and to review chronic medical problems    Overall ok except for her knees- has OA  Worse in cold weather  Has not had injections yet - takes nsaid at times    Wt is down 3 lb with bmi of 6 Has tried to loose weight - eating a healthier diet    Zoster status- is interested in one   Td 5/04-is due for her 10 year shot today   mammo 2/13-has not scheduled yet- she goes to [place]  Self exam-no lumps or changes   Pap 7/13 - sees gyn -no problems  colonosc 2/10- is up to date currently  Lipid Lab Results  Component Value Date   CHOL 180 10/31/2012   CHOL 172 09/20/2011   CHOL 195 07/07/2010   Lab Results  Component Value Date   HDL 46.80 10/31/2012   HDL 40.98 09/20/2011   HDL 11.91 07/07/2010   Lab Results  Component Value Date   LDLCALC 116* 10/31/2012   LDLCALC 105* 09/20/2011   LDLCALC 114* 07/07/2010   Lab Results  Component Value Date   TRIG 84.0 10/31/2012   TRIG 95.0 09/20/2011   TRIG 188.0* 07/07/2010   Lab Results  Component Value Date   CHOLHDL 4 10/31/2012   CHOLHDL 4 09/20/2011   CHOLHDL 5 07/07/2010   No results found for this basename: LDLDIRECT   is a fried food eater and likes red meat -but trying to cut out    Patient Active Problem List  Diagnosis  . OBESITY  . ANXIETY  . ALLERGIC RHINITIS  . ACNE, MILD  . HEEL SPUR  . FATIGUE  . HYPERGLYCEMIA  . Routine general medical examination at a health care facility  . Other screening mammogram   Past Medical History  Diagnosis Date  . Allergy     allergic rhinitis  . Anxiety   . Post-menopausal bleeding     neg endo bx.   Past Surgical History  Procedure Laterality Date  . Cesarean section    . Foot surgery  1998    rt heel spur removal  . Refractive surgery  08/1999  . Facial cosmetic surgery  2006  . Endometrial biopsy      neg for CA cells   History   Substance Use Topics  . Smoking status: Never Smoker   . Smokeless tobacco: Not on file  . Alcohol Use: Yes     Comment: occasional alcohol   Family History  Problem Relation Age of Onset  . Stroke Mother   . Hypertension Mother   . Alzheimer's disease Father   . Depression Sister   . COPD Brother    No Known Allergies Current Outpatient Prescriptions on File Prior to Visit  Medication Sig Dispense Refill  . albuterol (PROVENTIL HFA;VENTOLIN HFA) 108 (90 BASE) MCG/ACT inhaler Inhale 2 puffs into the lungs every 4 (four) hours as needed.  1 Inhaler  1  . Cholecalciferol (VITAMIN D PO) Take by mouth as directed.      Marland Kitchen FLUoxetine (PROZAC) 40 MG capsule Take 1 capsule (40 mg total) by mouth daily.  90 capsule  0   No current facility-administered medications on file prior to visit.      Review of Systems Review of Systems  Constitutional: Negative for fever, appetite change, fatigue and unexpected weight change.  Eyes: Negative  for pain and visual disturbance.  Respiratory: Negative for cough and shortness of breath.   Cardiovascular: Negative for cp or palpitations    Gastrointestinal: Negative for nausea, diarrhea and constipation.  Genitourinary: Negative for urgency and frequency.  Skin: Negative for pallor or rash   MSK pos for knee pain from arthritis  Neurological: Negative for weakness, light-headedness, numbness and headaches.  Hematological: Negative for adenopathy. Does not bruise/bleed easily.  Psychiatric/Behavioral: Negative for dysphoric mood. The patient is not nervous/anxious.         Objective:   Physical Exam  Constitutional: She appears well-developed and well-nourished.  obese and well appearing   HENT:  Head: Normocephalic and atraumatic.  Right Ear: External ear normal.  Left Ear: External ear normal.  Nose: Nose normal.  Mouth/Throat: Oropharynx is clear and moist.  Eyes: Conjunctivae and EOM are normal. Pupils are equal, round, and reactive  to light. Right eye exhibits no discharge. Left eye exhibits no discharge. No scleral icterus.  Neck: Normal range of motion. Neck supple. No JVD present. Carotid bruit is not present. No thyromegaly present.  Cardiovascular: Normal rate, regular rhythm, normal heart sounds and intact distal pulses.  Exam reveals no gallop.   Pulmonary/Chest: Effort normal and breath sounds normal. No respiratory distress. She has no wheezes. She exhibits no tenderness.  Abdominal: Soft. Bowel sounds are normal. She exhibits no distension, no abdominal bruit and no mass. There is no tenderness.  Musculoskeletal: She exhibits no edema and no tenderness.  Lymphadenopathy:    She has no cervical adenopathy.  Neurological: She is alert. She has normal reflexes. No cranial nerve deficit. She exhibits normal muscle tone. Coordination normal.  Skin: Skin is warm and dry. No rash noted. No erythema. No pallor.  Psychiatric: She has a normal mood and affect.          Assessment & Plan:

## 2012-11-05 NOTE — Patient Instructions (Addendum)
If you are interested in a shingles/zoster vaccine - call your insurance to check on coverage,( you should not get it within 1 month of other vaccines) , then call us for a prescription  for it to take to a pharmacy that gives the shot , or make a nurse visit to get it here depending on your coverave   Tdap vaccine today (tetanus shot) Think about water exercise Watch sugar in diet and keep working in weight loss

## 2012-11-13 ENCOUNTER — Ambulatory Visit: Payer: Self-pay | Admitting: Family Medicine

## 2012-11-13 IMAGING — MG MM CAD SCREENING MAMMO
1 series · 5 of 5 positions shown · non-contrast
Comparison: none

REASON FOR EXAM: SCR MAMMO NO ORDER
COMMENTS:

PROCEDURE:     MAM - MAM DGTL SCRN MAM NO ORDER W/CAD  - [DATE]  [DATE]
RESULT:     Bilateral digital screening mammogram with CAD dated [DATE]
Comparison study/studies dated:
[DATE], [DATE], [DATE], [DATE]

[R CC · right · 5 of 5 slices shown]
[im 1/5]
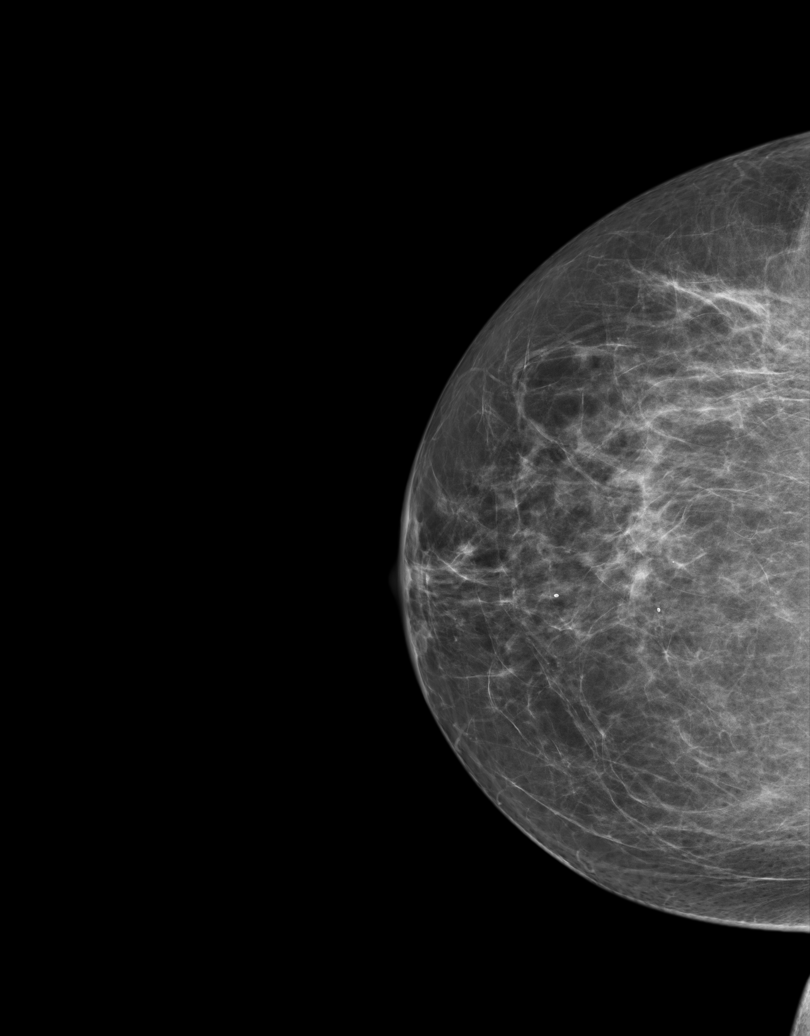
[im 2/5]
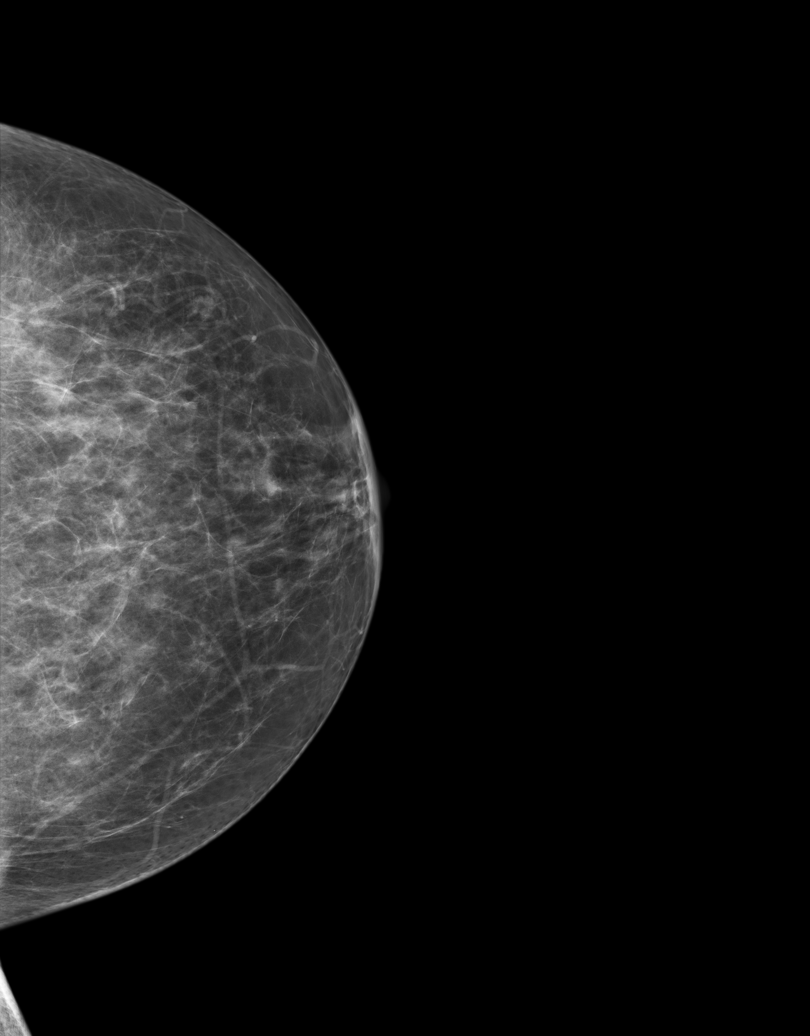
[im 3/5]
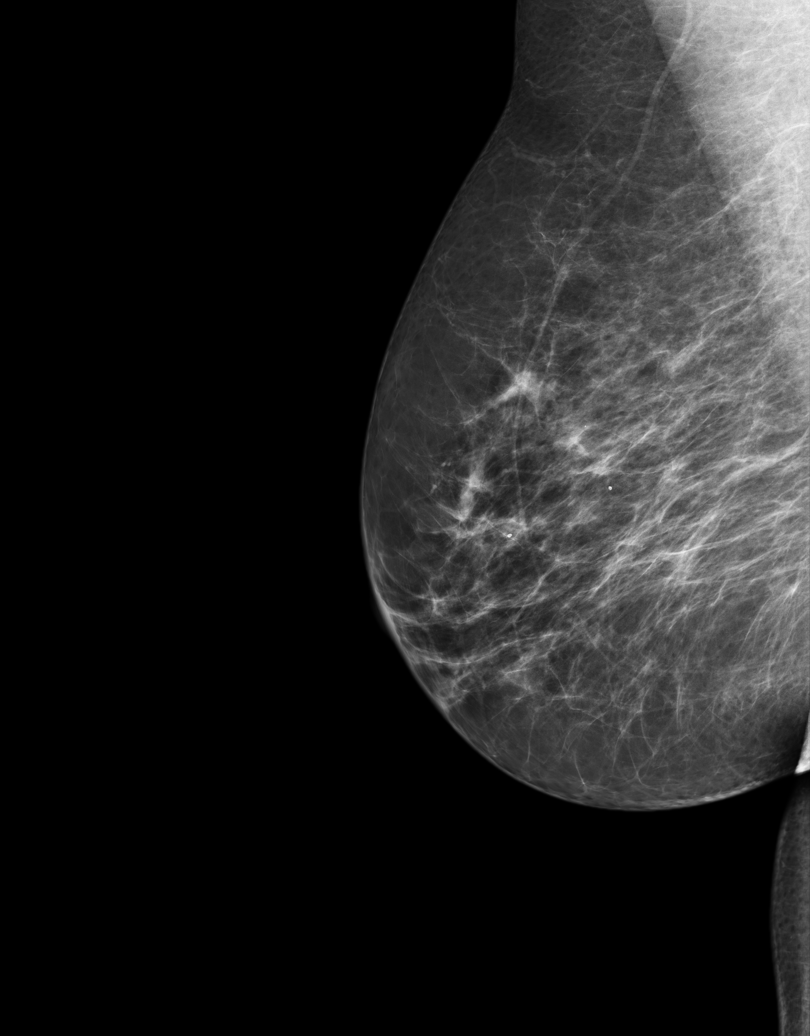
[im 4/5]
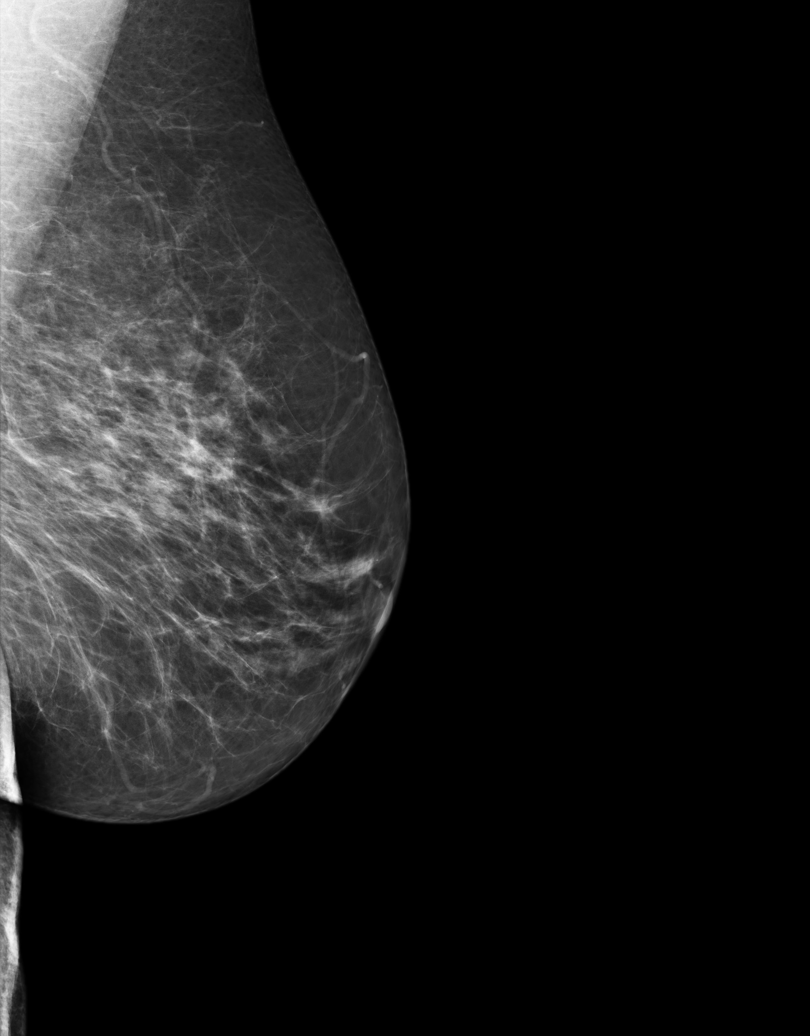
[im 5/5]
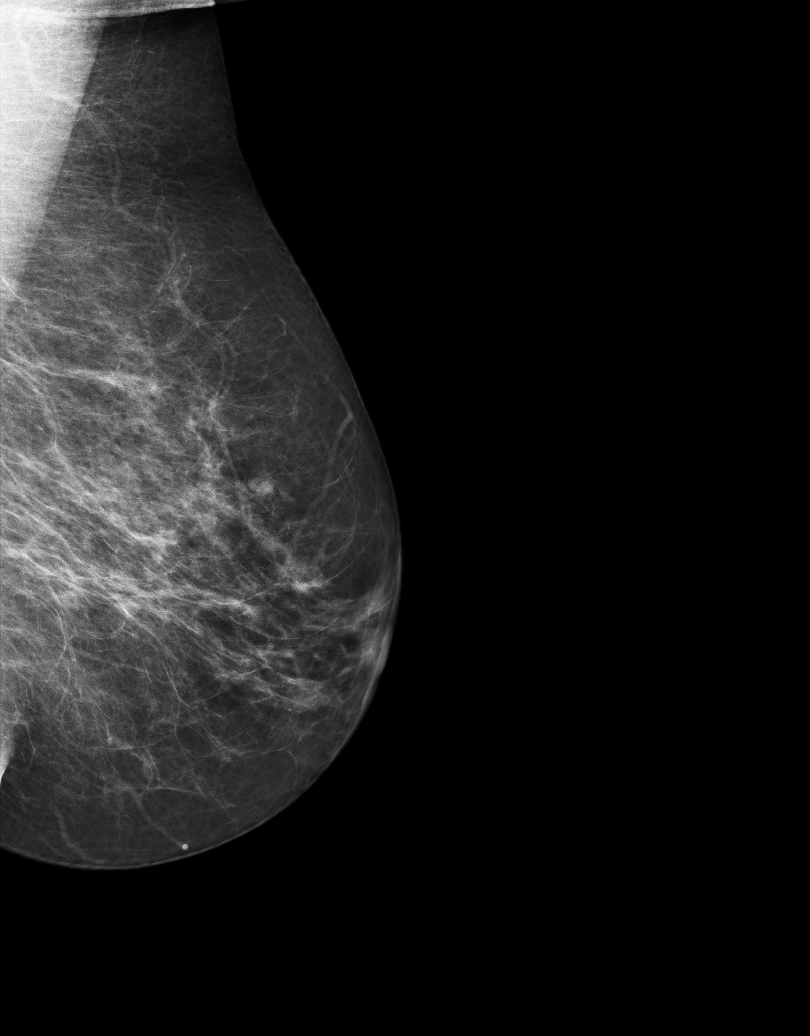

[5 of 5 positions shown; findings below may reference images not displayed]

FINDINGS: BREAST COMPOSITION: The breast composition is SCATTERED FIBROGLANDULAR
TISSUE (glandular tissue is 25-50%)

There is no radiographic evidence of malignant type calcifications ,
architectural distortion, spiculated masses or nodules.
IMPRESSION: BI-RADS: Category 2- Benign Finding

A NEGATIVE MAMMOGRAM REPORT DOES NOT PRECLUDE BIOPSY OR OTHER EVALUATION OF
A CLINICALLY PALPABLE OR OTHERWISE SUSPICIOUS MASS OR LESION. BREAST CANCER
MAY NOT BE DETECTED IN UP TO 10% OF CASES.

## 2012-11-15 ENCOUNTER — Encounter: Payer: Self-pay | Admitting: Family Medicine

## 2012-11-16 ENCOUNTER — Encounter: Payer: Self-pay | Admitting: *Deleted

## 2012-12-24 ENCOUNTER — Telehealth: Payer: Self-pay

## 2012-12-24 NOTE — Telephone Encounter (Signed)
I will write a note - cannot guarantee they will accept it - often health condition has to be much more severe or life threatening to get excused  I will leave note in IN box

## 2012-12-24 NOTE — Telephone Encounter (Signed)
Pt request a letter excusing pt from jury duty. Pt said her arthritis will not allow her to sit for long periods of time without being in a lot of pain.Pt request call back.

## 2012-12-25 NOTE — Telephone Encounter (Signed)
Pt notified letter ready for pick-up but they may not accept it

## 2013-03-18 ENCOUNTER — Encounter: Payer: Self-pay | Admitting: Certified Nurse Midwife

## 2013-03-21 ENCOUNTER — Ambulatory Visit (INDEPENDENT_AMBULATORY_CARE_PROVIDER_SITE_OTHER): Payer: BC Managed Care – PPO | Admitting: Certified Nurse Midwife

## 2013-03-21 ENCOUNTER — Encounter: Payer: Self-pay | Admitting: Certified Nurse Midwife

## 2013-03-21 VITALS — BP 120/82 | HR 60 | Resp 16 | Ht 64.25 in | Wt 223.0 lb

## 2013-03-21 DIAGNOSIS — Z0000 Encounter for general adult medical examination without abnormal findings: Secondary | ICD-10-CM

## 2013-03-21 DIAGNOSIS — Z01419 Encounter for gynecological examination (general) (routine) without abnormal findings: Secondary | ICD-10-CM

## 2013-03-21 LAB — POCT URINALYSIS DIPSTICK
Glucose, UA: NEGATIVE
Nitrite, UA: NEGATIVE
Protein, UA: NEGATIVE
Urobilinogen, UA: NEGATIVE

## 2013-03-21 NOTE — Progress Notes (Signed)
62 y.o. G24P2002 Married Caucasian Fe here for annual exam.  Menopausal no HRT, denies vaginal bleeding. Had recent loss of brother recently to heart attack and sister in hospice with CVD. Aware she has gained weight working on exercise to help with anxiety also.  Treated self with OTC monistat with all symptoms resolved. Using [facility] for vaginal dryness. Sees PCP yearly for medication management. No other health issues today.  Patient's last menstrual period was 09/05/2008.          Sexually active: yes  The current method of family planning is post menopausal status.    Exercising: no  exercise Smoker:  no  Health Maintenance: Pap: 03-19-12 neg HPV HR neg MMG:  3/14 Colonoscopy:  2/11 BMD:   2007 TDaP:  3/14 Labs: poct urine-neg Self breast exam:monthly   reports that she has never smoked. She does not have any smokeless tobacco history on file. She reports that she does not drink alcohol or use illicit drugs.  Past Medical History  Diagnosis Date  . Allergy     allergic rhinitis  . Anxiety   . Post-menopausal bleeding     neg endo bx.  . Infertility, female     Past Surgical History  Procedure Laterality Date  . Foot surgery  1998    rt heel spur removal  . Refractive surgery  08/1999  . Facial cosmetic surgery  2006    face lift  . Endometrial biopsy      neg for CA cells  . Cesarean section      times 2  . Cataract surgery  2011    Current Outpatient Prescriptions  Medication Sig Dispense Refill  . albuterol (PROVENTIL HFA;VENTOLIN HFA) 108 (90 BASE) MCG/ACT inhaler Inhale 2 puffs into the lungs every 4 (four) hours as needed.  1 Inhaler  1  . CALCIUM PO Take by mouth as needed.       . cetirizine (ZYRTEC) 10 MG chewable tablet Chew 10 mg by mouth as needed for allergies.      . Cholecalciferol (VITAMIN D PO) Take by mouth as needed.       Marland Kitchen FLUoxetine (PROZAC) 40 MG capsule Take 1 capsule (40 mg total) by mouth daily.  90 capsule  3  . fluticasone  (FLONASE) 50 MCG/ACT nasal spray Place 2 sprays into the nose as needed.      Marland Kitchen ibuprofen (ADVIL,MOTRIN) 200 MG tablet Take 200 mg by mouth as needed for pain.      . Multiple Vitamins-Minerals (MULTIVITAMIN PO) Take by mouth as needed.       . naproxen sodium (ANAPROX) 220 MG tablet Take 220 mg by mouth as needed.      . Omega-3 Fatty Acids (FISH OIL PO) Take by mouth as needed.        No current facility-administered medications for this visit.    Family History  Problem Relation Age of Onset  . Stroke Mother   . Hypertension Mother   . Osteoporosis Mother   . Alzheimer's disease Father   . Hypertension Father   . Osteoporosis Father   . Depression Sister   . Parkinson's disease Sister   . Hypertension Sister   . Osteoporosis Sister   . Thyroid disease Sister   . COPD Brother     ROS:  Pertinent items are noted in HPI.  Otherwise, a comprehensive ROS was negative.  Exam:   BP 120/82  Pulse 60  Resp 16  Ht 5' 4.25" (1.632  m)  Wt 223 lb (101.152 kg)  BMI 37.98 kg/m2  LMP 09/05/2008 Height: 5' 4.25" (163.2 cm)  Ht Readings from Last 3 Encounters:  03/21/13 5' 4.25" (1.632 m)  11/05/12 5' 4.25" (1.632 m)  09/27/11 5\' 4"  (1.626 m)    General appearance: alert, cooperative and appears stated age Head: Normocephalic, without obvious abnormality, atraumatic Neck: no adenopathy, supple, symmetrical, trachea midline and thyroid normal to inspection and palpation Lungs: clear to auscultation bilaterally Breasts: normal appearance, no masses or tenderness, No nipple retraction or dimpling, No nipple discharge or bleeding, No axillary or supraclavicular adenopathy Heart: regular rate and rhythm Abdomen: soft, non-tender; no masses,  no organomegaly Extremities: extremities normal, atraumatic, no cyanosis or edema Skin: Skin color, texture, turgor normal. No rashes or lesions Lymph nodes: Cervical, supraclavicular, and axillary nodes normal. No abnormal inguinal nodes  palpated Neurologic: Grossly normal   Pelvic: External genitalia:  no lesions              Urethra:  normal appearing urethra with no masses, tenderness or lesions              Bartholin's and Skene's: normal                 Vagina:atrophic appearing vagina with normal color and discharge, no lesions              Cervix: normal, non tender              Pap taken: no Bimanual Exam:  Uterus:  normal size, contour, position, consistency, mobility, non-tender and mid postion              Adnexa: normal adnexa and no mass, fullness, tenderness               Rectovaginal: Confirms               Anus:  normal sphincter tone, no lesions  A:  Well Woman with normal exam  Menopausal no HRT  Anxiety on medication with PCP management  P:   Reviewed health and wellness pertinent to exam  Aware of need for evaluation if vaginal bleeding  Continue follow up with PCp as indicated, encouraged to seek family, friends, and hospice counseling support.  Pap smear as per guidelines   Mammogram yearly pap smear not taken today  counseled on breast self exam, mammography screening, adequate intake of calcium and vitamin D, diet and exercise  return annually or prn  An After Visit Summary was printed and given to the patient.  Reviewed, TL

## 2013-03-21 NOTE — Patient Instructions (Addendum)

## 2013-08-05 ENCOUNTER — Other Ambulatory Visit: Payer: Self-pay | Admitting: *Deleted

## 2013-08-05 NOTE — Telephone Encounter (Signed)
Please refill for 6 mo 

## 2013-08-05 NOTE — Telephone Encounter (Signed)
Received faxed refill request from pharmacy. Last office visit 11/05/12. Is it okay to refill medication?

## 2013-08-06 MED ORDER — ALBUTEROL SULFATE HFA 108 (90 BASE) MCG/ACT IN AERS: 2.0000 | INHALATION_SPRAY | RESPIRATORY_TRACT | Status: DC | PRN

## 2013-08-06 NOTE — Telephone Encounter (Signed)
done

## 2013-09-07 ENCOUNTER — Encounter: Payer: Self-pay | Admitting: Physician Assistant

## 2013-09-07 ENCOUNTER — Ambulatory Visit (INDEPENDENT_AMBULATORY_CARE_PROVIDER_SITE_OTHER): Payer: 59 | Admitting: Physician Assistant

## 2013-09-07 VITALS — BP 128/88 | HR 70 | Temp 98.1°F | Resp 17 | Wt 224.0 lb

## 2013-09-07 DIAGNOSIS — J209 Acute bronchitis, unspecified: Secondary | ICD-10-CM | POA: Insufficient documentation

## 2013-09-07 MED ORDER — AZITHROMYCIN 250 MG PO TABS: ORAL_TABLET | ORAL | Status: DC

## 2013-09-07 MED ORDER — HYDROCOD POLST-CHLORPHEN POLST 10-8 MG/5ML PO LQCR: 5.0000 mL | Freq: Two times a day (BID) | ORAL | Status: DC | PRN

## 2013-09-07 NOTE — Progress Notes (Signed)
Pre visit review using our clinic review tool, if applicable. No additional management support is needed unless otherwise documented below in the visit note. 

## 2013-09-07 NOTE — Progress Notes (Signed)
Patient ID: Deborah Carey, female   DOB: 09/06/1950, 63 y.o.   MRN: [id number]  Patient presents to clinic today complaining of several weeks of cough that is productive of sputum. Patient also endorses fatigue and chest congestion. Denies shortness of breath and wheezing. Denies fever, chills, night sweats or weight loss. Patient does endorse one episode of blood-tinged sputum yesterday after a forceful coughing spell. Denies recurrence.  Patient denies recent travel or sick contact.  Past Medical History  Diagnosis Date  . Allergy     allergic rhinitis  . Anxiety   . Post-menopausal bleeding     neg endo bx.  . Infertility, female   . Osteoarthritis of both knees     Current Outpatient Prescriptions on File Prior to Visit  Medication Sig Dispense Refill  . albuterol (PROVENTIL HFA;VENTOLIN HFA) 108 (90 BASE) MCG/ACT inhaler Inhale 2 puffs into the lungs every 4 (four) hours as needed.  1 Inhaler  5  . CALCIUM PO Take by mouth as needed.       . cetirizine (ZYRTEC) 10 MG chewable tablet Chew 10 mg by mouth as needed for allergies.      . Cholecalciferol (VITAMIN D PO) Take by mouth as needed.       Marland Kitchen FLUoxetine (PROZAC) 40 MG capsule Take 1 capsule (40 mg total) by mouth daily.  90 capsule  3  . fluticasone (FLONASE) 50 MCG/ACT nasal spray Place 2 sprays into the nose as needed.      Marland Kitchen ibuprofen (ADVIL,MOTRIN) 200 MG tablet Take 200 mg by mouth as needed for pain.      . Multiple Vitamins-Minerals (MULTIVITAMIN PO) Take by mouth as needed.       . naproxen sodium (ANAPROX) 220 MG tablet Take 220 mg by mouth as needed.      . Omega-3 Fatty Acids (FISH OIL PO) Take by mouth as needed.        No current facility-administered medications on file prior to visit.    No Known Allergies  Family History  Problem Relation Age of Onset  . Stroke Mother   . Hypertension Mother   . Osteoporosis Mother   . Alzheimer's disease Father   . Hypertension Father   . Osteoporosis Father   .  Depression Sister   . Hypertension Sister   . Osteoporosis Sister   . Thyroid disease Sister   . COPD Brother   . Parkinson's disease Sister   . Osteoporosis Sister     History   Social History  . Marital Status: Married    Spouse Name: N/A    Number of Children: N/A  . Years of Education: N/A   Social History Main Topics  . Smoking status: Never Smoker   . Smokeless tobacco: None  . Alcohol Use: No     Comment: occasional alcohol  . Drug Use: No  . Sexual Activity: Yes    Partners: Male    Birth Control/ Protection: None   Other Topics Concern  . None   Social History Narrative  . None   Review of Systems - See HPI.  All other ROS are negative.  Filed Vitals:   09/07/13 1214  BP: 128/88  Pulse: 70  Temp: 98.1 F (36.7 C)  Resp: 17    Physical Exam  Vitals reviewed. Constitutional: She is oriented to person, place, and time and well-developed, well-nourished, and in no distress.  HENT:  Head: Normocephalic and atraumatic.  Right Ear: External ear normal.  Left Ear:  External ear normal.  Nose: Nose normal.  Mouth/Throat: Oropharynx is clear and moist. No oropharyngeal exudate.  Tympanic membranes within normal limits bilaterally. No tenderness to percussion of sinuses noted on examination.  Eyes: Conjunctivae are normal. Pupils are equal, round, and reactive to light.  Neck: Neck supple.  Cardiovascular: Normal rate, regular rhythm, normal heart sounds and intact distal pulses.   Pulmonary/Chest: Effort normal and breath sounds normal. No respiratory distress. She has no wheezes. She has no rales. She exhibits no tenderness.  Lymphadenopathy:    She has no cervical adenopathy.  Neurological: She is alert and oriented to person, place, and time. No cranial nerve deficit.  Skin: Skin is warm and dry. No rash noted.  Psychiatric: Affect normal.    Assessment/Plan: No problem-specific assessment & plan notes found for this encounter.

## 2013-09-07 NOTE — Patient Instructions (Signed)
Increase fluid intake.  Take antibiotic as prescribed.  Plain Mucinex.  Humidifier in bedroom.  Saline nasal spray.  Continue other medications as prescribed.  Please follow-up with Dr. Alba Cory for repeat examination of thyroid and labs.  If symptoms not improving, please follow-up with PCP for additional workup.

## 2013-09-07 NOTE — Assessment & Plan Note (Signed)
Rx azithromycin. Rx Tussionex for cough. Increase fluid intake. Saline nasal spray. Probiotic. Blood-tinged sputum most likely pseudohemoptysis resulting from dry, irritated upper airways. No increased risk of TB. No increased risk of esophageal varices. Place a humidifier in the bedroom to help with this. Plain Mucinex. Call or return to clinic if symptoms not improving

## 2013-09-13 ENCOUNTER — Other Ambulatory Visit: Payer: Self-pay | Admitting: Physician Assistant

## 2013-09-16 ENCOUNTER — Ambulatory Visit (INDEPENDENT_AMBULATORY_CARE_PROVIDER_SITE_OTHER): Payer: 59 | Admitting: Family Medicine

## 2013-09-16 ENCOUNTER — Encounter: Payer: Self-pay | Admitting: Family Medicine

## 2013-09-16 VITALS — BP 128/74 | HR 83 | Temp 97.9°F | Ht 64.25 in | Wt 220.5 lb

## 2013-09-16 DIAGNOSIS — E049 Nontoxic goiter, unspecified: Secondary | ICD-10-CM

## 2013-09-16 LAB — TSH: TSH: 1.82 u[IU]/mL (ref 0.35–5.50)

## 2013-09-16 LAB — T4, FREE: Free T4: 0.83 ng/dL (ref 0.60–1.60)

## 2013-09-16 NOTE — Assessment & Plan Note (Signed)
Poss R thyroid enlargement  Thyroid US ordered Lab-thyroid panel today

## 2013-09-16 NOTE — Progress Notes (Signed)
Pre-visit discussion using our clinic review tool. No additional management support is needed unless otherwise documented below in the visit note.  

## 2013-09-16 NOTE — Progress Notes (Signed)
Subjective:    Patient ID: Deborah Carey, female    DOB: 03/05/1951, 63 y.o.   MRN: [id number]  HPI Here for f/u of ? Enlarged thyroid   Saw Dr at Sat clinic- for uri  Noted she had thyroid enlargement   She notices some swelling/ fatty area on L bigger than the R over clavicle  She has had no thyroid hx  Sister is hypothyroid- ? Cause   Wt is down a few lb  Has felt keyed up lately - and problems falling asleep  Does not think she is jittery  And for about 6 months - tired  She has noted some trouble swallowing - painful at times   Lab Results  Component Value Date   TSH 1.22 10/31/2012     Patient Active Problem List   Diagnosis Date Noted  . Acute bronchitis 09/07/2013  . Primary osteoarthritis of both knees 11/05/2012  . Other screening mammogram 09/27/2011  . Routine general medical examination at a health care facility 09/19/2011  . OBESITY 06/06/2008  . ACNE, MILD 06/06/2008  . HYPERGLYCEMIA 06/06/2008  . ANXIETY 04/17/2007  . ALLERGIC RHINITIS 04/17/2007  . HEEL SPUR 04/17/2007   Past Medical History  Diagnosis Date  . Allergy     allergic rhinitis  . Anxiety   . Post-menopausal bleeding     neg endo bx.  . Infertility, female   . Osteoarthritis of both knees    Past Surgical History  Procedure Laterality Date  . Foot surgery  1998    rt heel spur removal  . Refractive surgery  08/1999  . Facial cosmetic surgery  2006    face lift  . Endometrial biopsy      neg for CA cells  . Cesarean section      times 2  . Cataract surgery  2011   History  Substance Use Topics  . Smoking status: Never Smoker   . Smokeless tobacco: Not on file  . Alcohol Use: No     Comment: occasional alcohol   Family History  Problem Relation Age of Onset  . Stroke Mother   . Hypertension Mother   . Osteoporosis Mother   . Alzheimer's disease Father   . Hypertension Father   . Osteoporosis Father   . Depression Sister   . Hypertension Sister   . Osteoporosis  Sister   . Thyroid disease Sister   . COPD Brother   . Parkinson's disease Sister   . Osteoporosis Sister    No Known Allergies Current Outpatient Prescriptions on File Prior to Visit  Medication Sig Dispense Refill  . albuterol (PROVENTIL HFA;VENTOLIN HFA) 108 (90 BASE) MCG/ACT inhaler Inhale 2 puffs into the lungs every 4 (four) hours as needed.  1 Inhaler  5  . azithromycin (ZITHROMAX) 250 MG tablet Take 2 tablets on Day 1.  Then take 1 tablet daily.  6 tablet  0  . CALCIUM PO Take by mouth as needed.       . cetirizine (ZYRTEC) 10 MG chewable tablet Chew 10 mg by mouth as needed for allergies.      . chlorpheniramine-HYDROcodone (TUSSIONEX PENNKINETIC ER) 10-8 MG/5ML LQCR Take 5 mLs by mouth every 12 (twelve) hours as needed.  115 mL  0  . Cholecalciferol (VITAMIN D PO) Take by mouth as needed.       Marland Kitchen FLUoxetine (PROZAC) 40 MG capsule Take 1 capsule (40 mg total) by mouth daily.  90 capsule  3  . fluticasone (FLONASE)  50 MCG/ACT nasal spray Place 2 sprays into the nose as needed.      Marland Kitchen ibuprofen (ADVIL,MOTRIN) 200 MG tablet Take 200 mg by mouth as needed for pain.      . Multiple Vitamins-Minerals (MULTIVITAMIN PO) Take by mouth as needed.       . naproxen sodium (ANAPROX) 220 MG tablet Take 220 mg by mouth as needed.      . Omega-3 Fatty Acids (FISH OIL PO) Take by mouth as needed.        No current facility-administered medications on file prior to visit.     Review of Systems Review of Systems  Constitutional: Negative for fever, appetite change, fatigue and unexpected weight change.  Eyes: Negative for pain and visual disturbance.  Respiratory: Negative for cough and shortness of breath.   Cardiovascular: Negative for cp or palpitations    Gastrointestinal: Negative for nausea, diarrhea and constipation. pos for dysphagia  Genitourinary: Negative for urgency and frequency.  Skin: Negative for pallor or rash   Neurological: Negative for weakness, light-headedness, numbness  and headaches. neg for tremor  Hematological: Negative for adenopathy. Does not bruise/bleed easily.  Psychiatric/Behavioral: Negative for dysphoric mood. The patient is not nervous/anxious.         Objective:   Physical Exam  Constitutional: She appears well-developed and well-nourished. No distress.  obese and well appearing    HENT:  Head: Normocephalic and atraumatic.  Mouth/Throat: Oropharynx is clear and moist.  Eyes: Conjunctivae are normal. Pupils are equal, round, and reactive to light. No scleral icterus.  Neck: Normal range of motion. Neck supple. No JVD present. No tracheal deviation present. Thyromegaly present.  Fullness noted over L thyroid area (no bruit or tenderness or discrete nodule palpated)  Cardiovascular: Normal rate and regular rhythm.   Pulmonary/Chest: Effort normal and breath sounds normal.  No crackles   Musculoskeletal: Normal range of motion.  Lymphadenopathy:    She has no cervical adenopathy.  Neurological: She is alert. She has normal reflexes. She displays no tremor.  Skin: Skin is warm and dry. No rash noted. No erythema. No pallor.  Psychiatric: She has a normal mood and affect.          Assessment & Plan:

## 2013-09-16 NOTE — Patient Instructions (Signed)
Labs today for thyroid function Stop up front for a referral for a thyroid ultrasound

## 2013-09-17 LAB — T3 UPTAKE: T3 UPTAKE: 34.7 % (ref 22.5–37.0)

## 2013-09-23 ENCOUNTER — Ambulatory Visit: Payer: Self-pay | Admitting: Family Medicine

## 2013-09-23 IMAGING — US THYROID ULTRASOUND
1 series · 14 of 25 positions shown · non-contrast
Comparison: None.

CLINICAL DATA: Enlarged thyroid

EXAM:
THYROID ULTRASOUND
TECHNIQUE: Ultrasound examination of the thyroid gland and adjacent soft
tissues was performed.

[Series 1: thyroid ultrasound · 0.08mm/px · 14 of 52 slices shown]
[im 1/52]
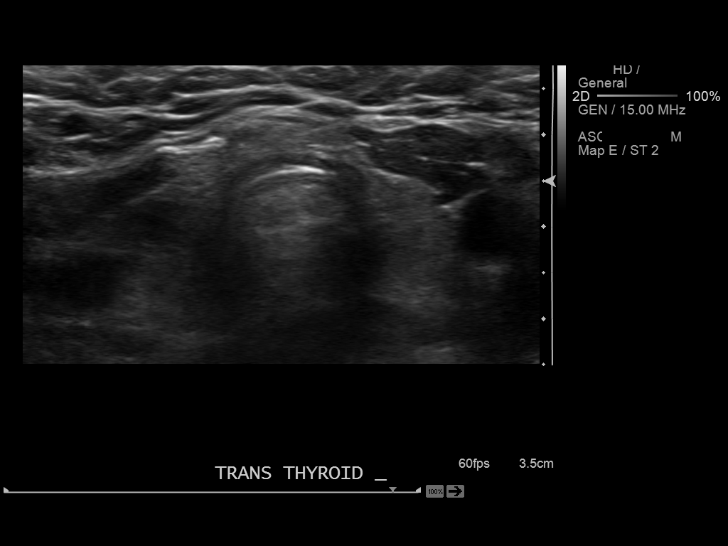
[im 5/52]
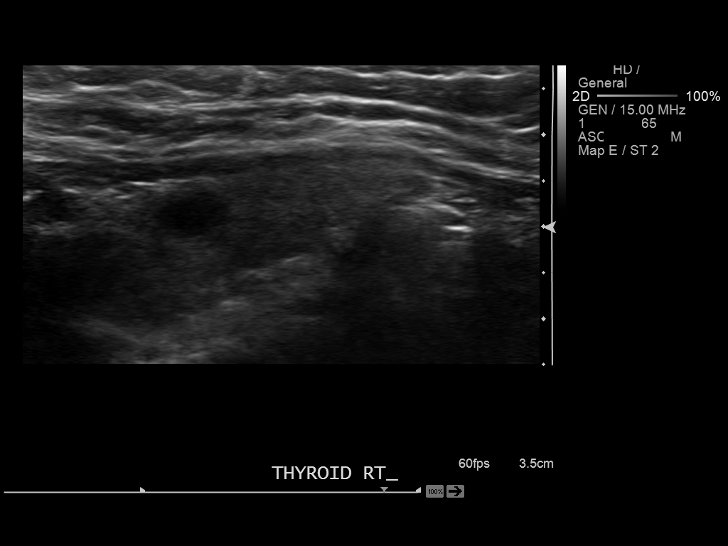
[im 9/52]
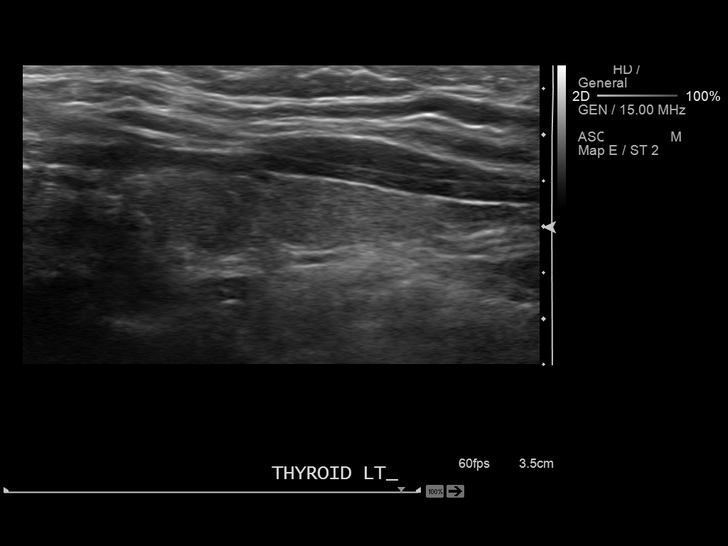
[im 13/52]
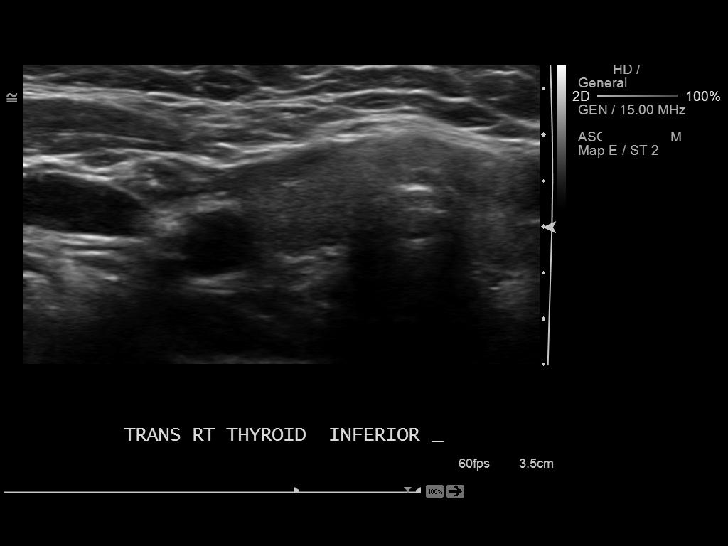
[im 18/52]
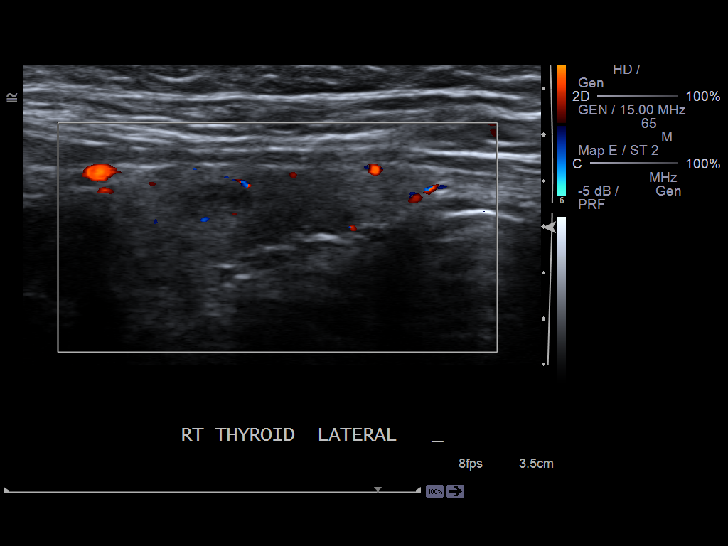
[im 20/52]
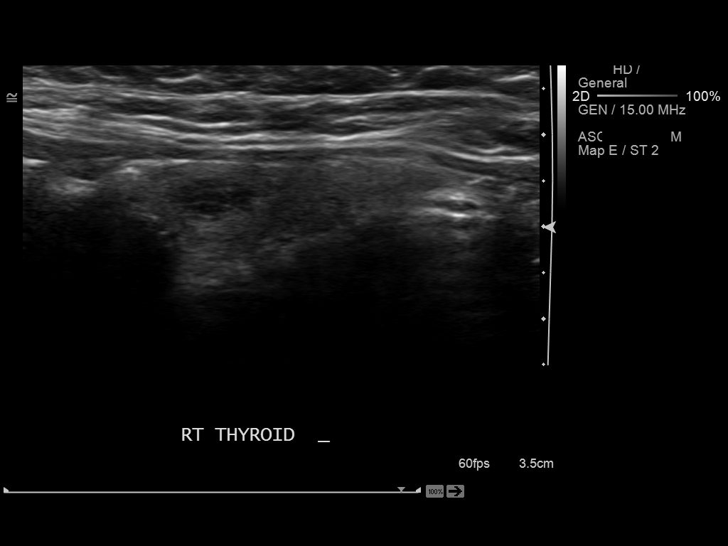
[im 24/52]
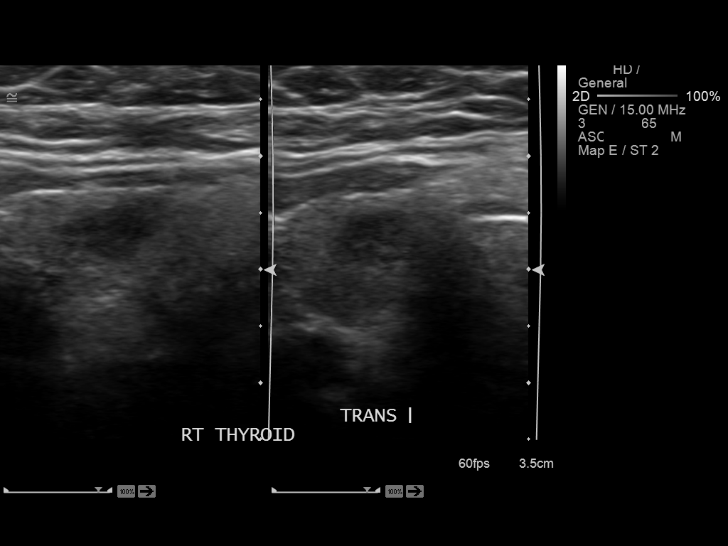
[im 28/52]
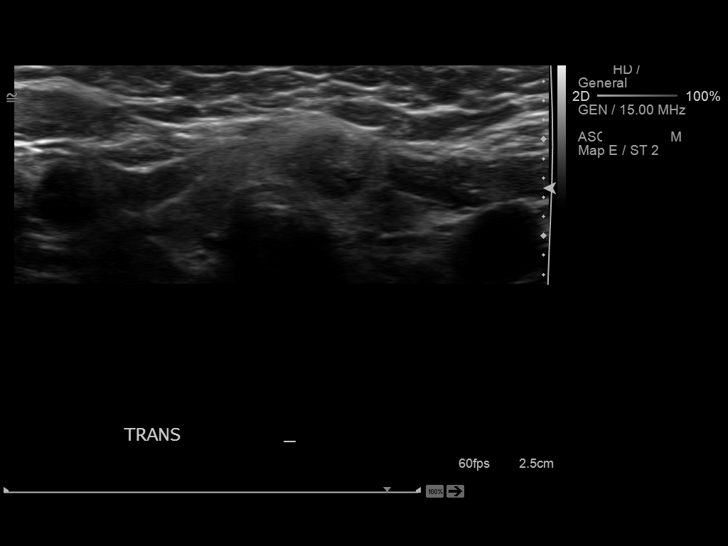
[im 32/52]
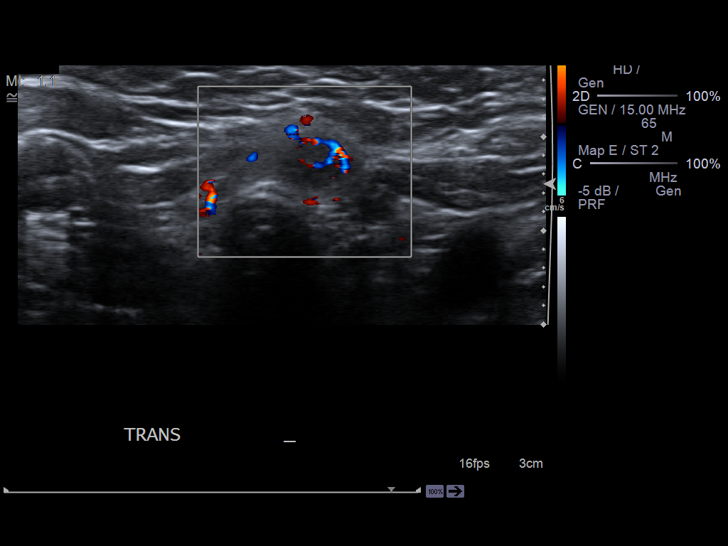
[im 35/52]
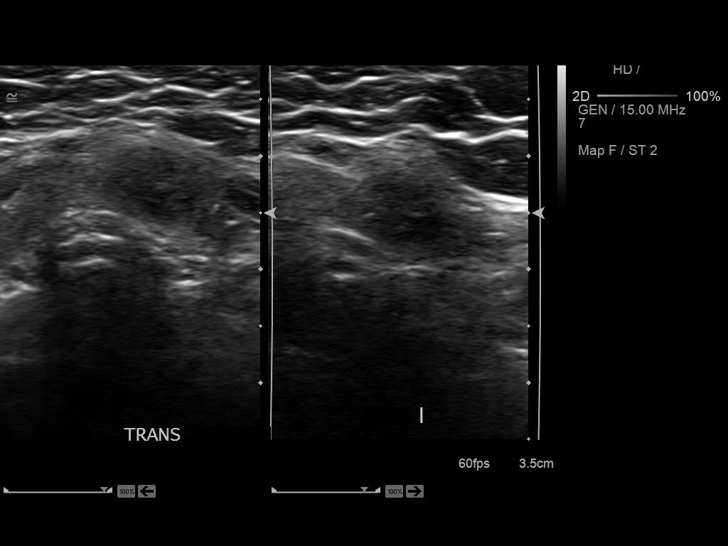
[im 39/52]
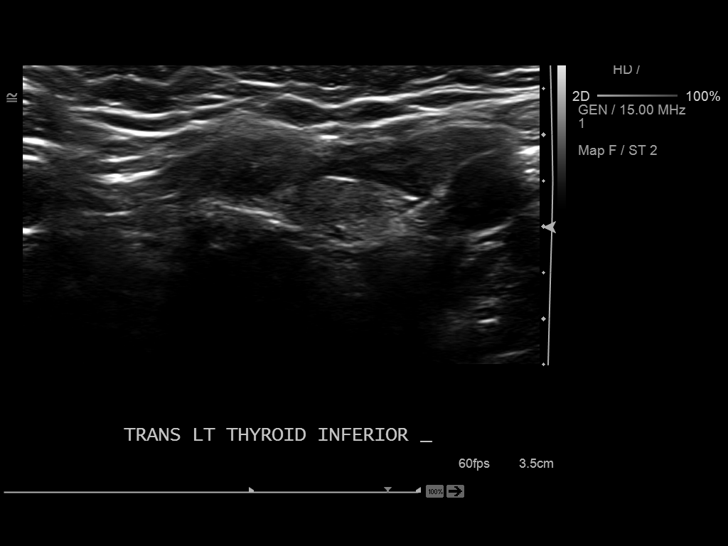
[im 43/52]
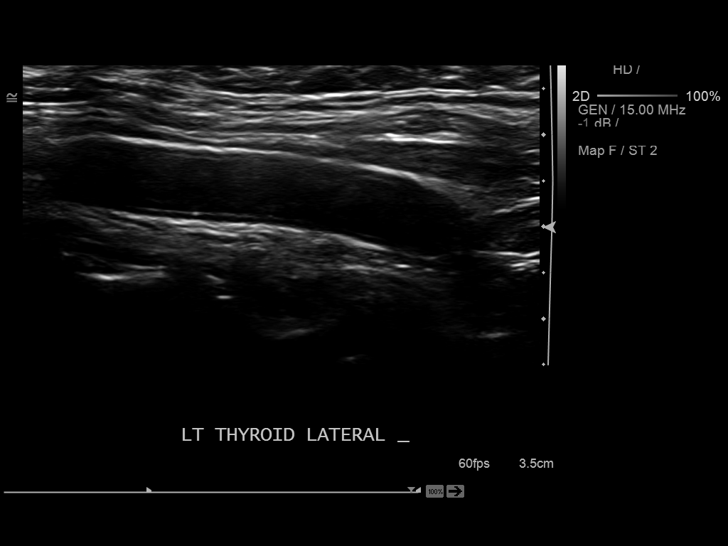
[im 47/52]
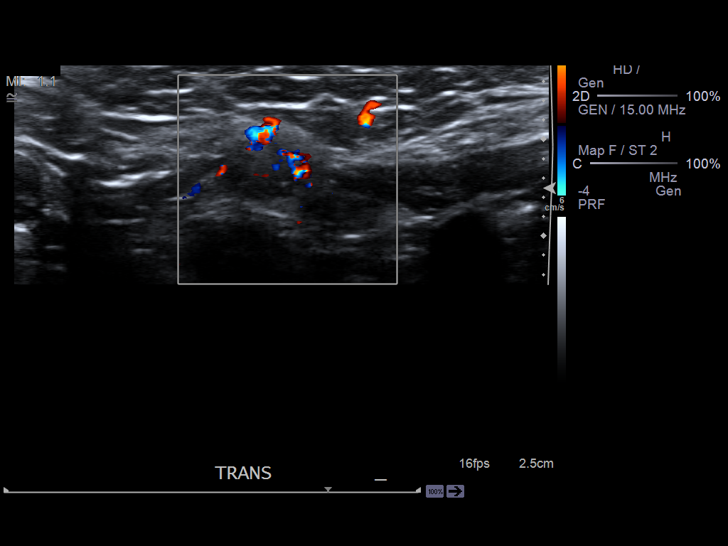
[im 52/52]
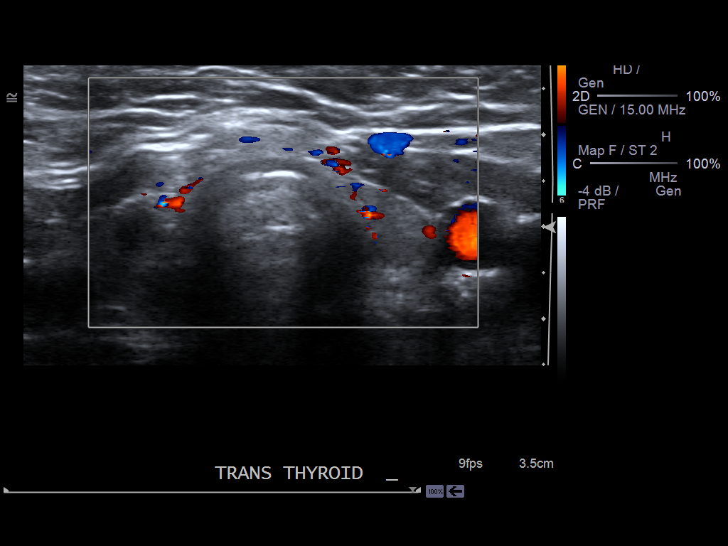

[14 of 25 positions shown; findings below may reference images not displayed]

FINDINGS: Right thyroid lobe

Measurements: 3.5 x 1.3 x 1.2 cm. 7 x 5 x 6 mm spongiform nodule in
the right mid gland.

Left thyroid lobe

Measurements: 3.5 x 1.2 x 1.0 cm..  No nodules visualized.

Isthmus

Thickness: 3 mm.  9 x 10 x 6 mm solid nodule in the left isthmus.

Lymphadenopathy

None visualized.
IMPRESSION: 10 mm solid nodule in the left isthmus.

Findings do not meet current SRU consensus criteria for biopsy.
Follow-up by clinical exam is recommended. If patient has known risk
factors for thyroid carcinoma, consider follow-up ultrasound in 12
months. If patient is clinically hyperthyroid, consider nuclear
medicine thyroid uptake and scan.

Reference: Management of Thyroid Nodules Detected at US: Society of
Radiologists in Ultrasound Consensus Conference Statement. Radiology

## 2013-09-24 ENCOUNTER — Encounter: Payer: Self-pay | Admitting: Family Medicine

## 2013-09-26 ENCOUNTER — Encounter: Payer: Self-pay | Admitting: *Deleted

## 2013-09-26 ENCOUNTER — Encounter: Payer: Self-pay | Admitting: Family Medicine

## 2013-09-26 DIAGNOSIS — E041 Nontoxic single thyroid nodule: Secondary | ICD-10-CM | POA: Insufficient documentation

## 2013-09-30 ENCOUNTER — Telehealth: Payer: Self-pay | Admitting: Family Medicine

## 2013-09-30 NOTE — Telephone Encounter (Signed)
We just mailed her a copy of the report and instructions -- she probably did not get it yet  There was a thyroid nodule that does not meet criteria for biopsy- but we should watch it - would consider another ultrasound in a year  Let us know if she does not get the letter

## 2013-09-30 NOTE — Telephone Encounter (Signed)
Pt called to get [place] results.  Scan is in from 09/23/13 but I do not see result notes for pt.

## 2013-10-01 NOTE — Telephone Encounter (Signed)
Patient advised.

## 2013-10-04 ENCOUNTER — Telehealth: Payer: Self-pay | Admitting: Family Medicine

## 2013-10-04 NOTE — Telephone Encounter (Signed)
Pt schedule appt for Monday 10/07/13 I advise pt if SOB or CP over weekend to go to ER, pt verbalized understanding

## 2013-10-04 NOTE — Telephone Encounter (Signed)
Please have her f/u with me next week  If sob/cp or any symptoms - alert me  thanks

## 2013-10-04 NOTE — Telephone Encounter (Signed)
Pt went today to [facility] preparing for cataract eye surgery on 10/23/13, and nurse found and irrregular heart beat and wanted pt to notify you.

## 2013-10-07 ENCOUNTER — Ambulatory Visit (INDEPENDENT_AMBULATORY_CARE_PROVIDER_SITE_OTHER): Payer: 59 | Admitting: Family Medicine

## 2013-10-07 ENCOUNTER — Encounter: Payer: Self-pay | Admitting: Family Medicine

## 2013-10-07 VITALS — BP 138/78 | HR 74 | Temp 98.1°F | Ht 64.25 in | Wt 224.0 lb

## 2013-10-07 DIAGNOSIS — I499 Cardiac arrhythmia, unspecified: Secondary | ICD-10-CM | POA: Insufficient documentation

## 2013-10-07 NOTE — Assessment & Plan Note (Signed)
Rev history- unsure what was heard/found on her exam at opthy- today reassuring/ nl exam  EKG is NSR with rate in 60s Pt denies any hx of cp/sob or other cardiac symptoms  I feel we can clear her for upcoming cataract surgery inst her to update [place] / follow up if she develops cardiac symptoms at any time

## 2013-10-07 NOTE — Patient Instructions (Signed)
Your heart exam and EKG are normal today  If you develop palpitations/ shortness of breath or chest pain / please let us know No restrictions for your cataract surgery  Stay away from caffeine as much as possible

## 2013-10-07 NOTE — Progress Notes (Signed)
Pre-visit discussion using our clinic review tool. No additional management support is needed unless otherwise documented below in the visit note.  

## 2013-10-07 NOTE — Progress Notes (Signed)
Subjective:    Patient ID: Deborah Carey, female    DOB: 1951-03-04, 63 y.o.   MRN: [id number]  HPI Here for an irregular heart beat noted at visit for her cataract surg   (2/18) Was told she had "a beat and a click)   EKG today NSR with rate of 69   No cp or sob or other symptoms   No hx of rheumatic fever  No hx of heart M   No cardiac history  Not really eating healthy or getting exercise - is thinking about it  Patient Active Problem List   Diagnosis Date Noted  . Left thyroid nodule 09/26/2013  . Enlarged thyroid 09/16/2013  . Acute bronchitis 09/07/2013  . Primary osteoarthritis of both knees 11/05/2012  . Other screening mammogram 09/27/2011  . Routine general medical examination at a health care facility 09/19/2011  . OBESITY 06/06/2008  . ACNE, MILD 06/06/2008  . HYPERGLYCEMIA 06/06/2008  . ANXIETY 04/17/2007  . ALLERGIC RHINITIS 04/17/2007  . HEEL SPUR 04/17/2007   Past Medical History  Diagnosis Date  . Allergy     allergic rhinitis  . Anxiety   . Post-menopausal bleeding     neg endo bx.  . Infertility, female   . Osteoarthritis of both knees    Past Surgical History  Procedure Laterality Date  . Foot surgery  1998    rt heel spur removal  . Refractive surgery  08/1999  . Facial cosmetic surgery  2006    face lift  . Endometrial biopsy      neg for CA cells  . Cesarean section      times 2  . Cataract surgery  2011   History  Substance Use Topics  . Smoking status: Never Smoker   . Smokeless tobacco: Not on file  . Alcohol Use: No     Comment: occasional alcohol   Family History  Problem Relation Age of Onset  . Stroke Mother   . Hypertension Mother   . Osteoporosis Mother   . Alzheimer's disease Father   . Hypertension Father   . Osteoporosis Father   . Depression Sister   . Hypertension Sister   . Osteoporosis Sister   . Thyroid disease Sister   . COPD Brother   . Parkinson's disease Sister   . Osteoporosis Sister    No  Known Allergies Current Outpatient Prescriptions on File Prior to Visit  Medication Sig Dispense Refill  . albuterol (PROVENTIL HFA;VENTOLIN HFA) 108 (90 BASE) MCG/ACT inhaler Inhale 2 puffs into the lungs every 4 (four) hours as needed.  1 Inhaler  5  . CALCIUM PO Take by mouth as needed.       . cetirizine (ZYRTEC) 10 MG chewable tablet Chew 10 mg by mouth as needed for allergies.      Marland Kitchen FLUoxetine (PROZAC) 40 MG capsule Take 1 capsule (40 mg total) by mouth daily.  90 capsule  3  . fluticasone (FLONASE) 50 MCG/ACT nasal spray Place 2 sprays into the nose as needed.      Marland Kitchen ibuprofen (ADVIL,MOTRIN) 200 MG tablet Take 200 mg by mouth as needed for pain.      . Multiple Vitamins-Minerals (MULTIVITAMIN PO) Take by mouth as needed.       . naproxen sodium (ANAPROX) 220 MG tablet Take 220 mg by mouth as needed.      . Omega-3 Fatty Acids (FISH OIL PO) Take by mouth as needed.        No  current facility-administered medications on file prior to visit.    Review of Systems Review of Systems  Constitutional: Negative for fever, appetite change, fatigue and unexpected weight change.  Eyes: Negative for pain and visual disturbance.  Respiratory: Negative for cough and shortness of breath.   Cardiovascular: Negative for cp or palpitations neg for sob/ pedal edema/ cp /pnd or orthopnea    Gastrointestinal: Negative for nausea, diarrhea and constipation.  Genitourinary: Negative for urgency and frequency.  Skin: Negative for pallor or rash   Neurological: Negative for weakness, light-headedness, numbness and headaches.  Hematological: Negative for adenopathy. Does not bruise/bleed easily.  Psychiatric/Behavioral: Negative for dysphoric mood. The patient is not nervous/anxious.         Objective:   Physical Exam  Constitutional: She appears well-developed and well-nourished. No distress.  HENT:  Head: Normocephalic and atraumatic.  Mouth/Throat: Oropharynx is clear and moist.  Eyes:  Conjunctivae and EOM are normal. Pupils are equal, round, and reactive to light. No scleral icterus.  Neck: Normal range of motion. Neck supple. No JVD present. Carotid bruit is not present. No thyromegaly present.  Cardiovascular: Normal rate, regular rhythm, normal heart sounds and intact distal pulses.  Exam reveals no gallop and no friction rub.   No murmur heard. Nl exam sitting/ lying and leaning forward   Pulmonary/Chest: Effort normal and breath sounds normal. No respiratory distress. She has no wheezes. She has no rales.  No crackles   Musculoskeletal: She exhibits no edema and no tenderness.  Lymphadenopathy:    She has no cervical adenopathy.  Neurological: She is alert. She has normal reflexes. She exhibits normal muscle tone. Coordination normal.  Skin: Skin is warm and dry. No rash noted. No erythema. No pallor.  Psychiatric: She has a normal mood and affect.          Assessment & Plan:

## 2013-10-29 ENCOUNTER — Telehealth: Payer: Self-pay | Admitting: Family Medicine

## 2013-10-29 DIAGNOSIS — R7309 Other abnormal glucose: Secondary | ICD-10-CM

## 2013-10-29 DIAGNOSIS — Z0000 Encounter for general adult medical examination without abnormal findings: Secondary | ICD-10-CM | POA: Insufficient documentation

## 2013-10-29 NOTE — Telephone Encounter (Signed)
Message copied by Abner Greenspan on Tue Oct 29, 2013  3:36 PM ------      Message from: Ellamae Sia      Created: Fri Oct 25, 2013 11:58 AM      Regarding: Lab orders for Wednesday, 2.25.15       Patient is scheduled for CPX labs, please order future labs, Thanks , Terri       ------

## 2013-10-30 ENCOUNTER — Other Ambulatory Visit (INDEPENDENT_AMBULATORY_CARE_PROVIDER_SITE_OTHER): Payer: Medicare Other

## 2013-10-30 DIAGNOSIS — E669 Obesity, unspecified: Secondary | ICD-10-CM

## 2013-10-30 DIAGNOSIS — F411 Generalized anxiety disorder: Secondary | ICD-10-CM

## 2013-10-30 DIAGNOSIS — Z0000 Encounter for general adult medical examination without abnormal findings: Secondary | ICD-10-CM

## 2013-10-30 DIAGNOSIS — R7309 Other abnormal glucose: Secondary | ICD-10-CM

## 2013-10-30 LAB — CBC WITH DIFFERENTIAL/PLATELET
BASOS ABS: 0.1 10*3/uL (ref 0.0–0.1)
Basophils Relative: 0.8 % (ref 0.0–3.0)
EOS ABS: 0.2 10*3/uL (ref 0.0–0.7)
Eosinophils Relative: 2.4 % (ref 0.0–5.0)
HCT: 43 % (ref 36.0–46.0)
Hemoglobin: 14.4 g/dL (ref 12.0–15.0)
LYMPHS PCT: 31.8 % (ref 12.0–46.0)
Lymphs Abs: 2.3 10*3/uL (ref 0.7–4.0)
MCHC: 33.5 g/dL (ref 30.0–36.0)
MCV: 94.5 fl (ref 78.0–100.0)
MONO ABS: 0.4 10*3/uL (ref 0.1–1.0)
Monocytes Relative: 6.1 % (ref 3.0–12.0)
NEUTROS PCT: 58.9 % (ref 43.0–77.0)
Neutro Abs: 4.2 10*3/uL (ref 1.4–7.7)
Platelets: 272 10*3/uL (ref 150.0–400.0)
RBC: 4.55 Mil/uL (ref 3.87–5.11)
RDW: 12.5 % (ref 11.5–14.6)
WBC: 7.1 10*3/uL (ref 4.5–10.5)

## 2013-10-30 LAB — HEMOGLOBIN A1C: Hgb A1c MFr Bld: 6.2 % (ref 4.6–6.5)

## 2013-10-30 LAB — LDL CHOLESTEROL, DIRECT: Direct LDL: 141.8 mg/dL

## 2013-10-30 LAB — LIPID PANEL
CHOL/HDL RATIO: 5
CHOLESTEROL: 206 mg/dL — AB (ref 0–200)
HDL: 44.9 mg/dL (ref >39.00)
TRIGLYCERIDES: 140 mg/dL (ref 0.0–149.0)
VLDL: 28 mg/dL (ref 0.0–40.0)

## 2013-10-30 LAB — COMPREHENSIVE METABOLIC PANEL
ALBUMIN: 4.1 g/dL (ref 3.5–5.2)
ALK PHOS: 73 U/L (ref 39–117)
ALT: 16 U/L (ref 0–35)
AST: 18 U/L (ref 0–37)
BUN: 14 mg/dL (ref 6–23)
CALCIUM: 9.4 mg/dL (ref 8.4–10.5)
CHLORIDE: 103 meq/L (ref 96–112)
CO2: 27 mEq/L (ref 19–32)
Creatinine, Ser: 0.6 mg/dL (ref 0.4–1.2)
GFR: 98.94 mL/min (ref >60.00)
Glucose, Bld: 96 mg/dL (ref 70–99)
POTASSIUM: 4.2 meq/L (ref 3.5–5.1)
Sodium: 138 mEq/L (ref 135–145)
Total Bilirubin: 0.7 mg/dL (ref 0.3–1.2)
Total Protein: 7.5 g/dL (ref 6.0–8.3)

## 2013-10-30 LAB — TSH: TSH: 0.87 u[IU]/mL (ref 0.35–5.50)

## 2013-11-06 ENCOUNTER — Encounter: Payer: BC Managed Care – PPO | Admitting: Family Medicine

## 2013-11-08 ENCOUNTER — Ambulatory Visit (INDEPENDENT_AMBULATORY_CARE_PROVIDER_SITE_OTHER): Payer: 59 | Admitting: Family Medicine

## 2013-11-08 ENCOUNTER — Encounter: Payer: Self-pay | Admitting: Family Medicine

## 2013-11-08 VITALS — BP 136/78 | HR 81 | Temp 98.6°F | Ht 64.0 in | Wt 224.0 lb

## 2013-11-08 DIAGNOSIS — J019 Acute sinusitis, unspecified: Secondary | ICD-10-CM

## 2013-11-08 MED ORDER — HYDROCOD POLST-CHLORPHEN POLST 10-8 MG/5ML PO LQCR
5.0000 mL | Freq: Two times a day (BID) | ORAL | Status: DC | PRN
Start: 2013-11-08 — End: 2013-11-29

## 2013-11-08 MED ORDER — AMOXICILLIN-POT CLAVULANATE 875-125 MG PO TABS: 1.0000 | ORAL_TABLET | Freq: Two times a day (BID) | ORAL | Status: DC

## 2013-11-08 NOTE — Progress Notes (Signed)
Subjective:    Patient ID: Deborah Carey, female    DOB: May 12, 1951, 63 y.o.   MRN: [id number]  HPI  She has been sick on and off since the fall  Started Thursday of last week  Head is congested  Teeth hurt  Headache in face under eyes Constant cough  Yesterday- when she coughed up mucous - it was tan -also today  Just feels terrible  Did have chills the first night / head ached but not body  Does not feel like doing anything   Taking vitamin C  advil  allarest -allergy med    Had bronchitis Jan 3 Given cough med - she still uses at night - tussionex (is almost out of that)   She has had at least 4-5 uri since the fall  Had to re schedule her eye surgery   Patient Active Problem List   Diagnosis Date Noted  . Encounter for Medicare annual wellness exam 10/29/2013  . Irregular heartbeat 10/07/2013  . Left thyroid nodule 09/26/2013  . Enlarged thyroid 09/16/2013  . Primary osteoarthritis of both knees 11/05/2012  . Other screening mammogram 09/27/2011  . Routine general medical examination at a health care facility 09/19/2011  . OBESITY 06/06/2008  . HYPERGLYCEMIA 06/06/2008  . ANXIETY 04/17/2007  . ALLERGIC RHINITIS 04/17/2007   Past Medical History  Diagnosis Date  . Allergy     allergic rhinitis  . Anxiety   . Post-menopausal bleeding     neg endo bx.  . Infertility, female   . Osteoarthritis of both knees    Past Surgical History  Procedure Laterality Date  . Foot surgery  1998    rt heel spur removal  . Refractive surgery  08/1999  . Facial cosmetic surgery  2006    face lift  . Endometrial biopsy      neg for CA cells  . Cesarean section      times 2  . Cataract surgery  2011   History  Substance Use Topics  . Smoking status: Never Smoker   . Smokeless tobacco: Not on file  . Alcohol Use: No     Comment: occasional alcohol   Family History  Problem Relation Age of Onset  . Stroke Mother   . Hypertension Mother   . Osteoporosis Mother   .  Alzheimer's disease Father   . Hypertension Father   . Osteoporosis Father   . Depression Sister   . Hypertension Sister   . Osteoporosis Sister   . Thyroid disease Sister   . COPD Brother   . Parkinson's disease Sister   . Osteoporosis Sister    No Known Allergies Current Outpatient Prescriptions on File Prior to Visit  Medication Sig Dispense Refill  . albuterol (PROVENTIL HFA;VENTOLIN HFA) 108 (90 BASE) MCG/ACT inhaler Inhale 2 puffs into the lungs every 4 (four) hours as needed.  1 Inhaler  5  . CALCIUM PO Take by mouth as needed.       . cetirizine (ZYRTEC) 10 MG chewable tablet Chew 10 mg by mouth as needed for allergies.      . fluticasone (FLONASE) 50 MCG/ACT nasal spray Place 2 sprays into the nose as needed.      Marland Kitchen ibuprofen (ADVIL,MOTRIN) 200 MG tablet Take 200 mg by mouth as needed for pain.      . Multiple Vitamins-Minerals (MULTIVITAMIN PO) Take by mouth as needed.       . naproxen sodium (ANAPROX) 220 MG tablet Take 220 mg by  mouth as needed.      . Omega-3 Fatty Acids (FISH OIL PO) Take by mouth as needed.       Marland Kitchen FLUoxetine (PROZAC) 40 MG capsule Take 1 capsule (40 mg total) by mouth daily.  90 capsule  3   No current facility-administered medications on file prior to visit.    Review of Systems Review of Systems  Constitutional: Negative for fever, appetite change, fatigue and unexpected weight change.  Eyes: Negative for pain and visual disturbance.  ENT pos for congestion and sinus pain and ST  Respiratory: Negative for wheeze  and shortness of breath.  pos for cough Cardiovascular: Negative for cp or palpitations    Gastrointestinal: Negative for nausea, diarrhea and constipation.  Genitourinary: Negative for urgency and frequency.  Skin: Negative for pallor or rash   Neurological: Negative for weakness, light-headedness, numbness and headaches.  Hematological: Negative for adenopathy. Does not bruise/bleed easily.  Psychiatric/Behavioral: Negative for  dysphoric mood. The patient is not nervous/anxious.         Objective:   Physical Exam  Constitutional: She appears well-developed and well-nourished. No distress.  obese and well appearing  Is fatigued today  HENT:  Head: Normocephalic and atraumatic.  Right Ear: External ear normal.  Left Ear: External ear normal.  Mouth/Throat: Oropharynx is clear and moist. No oropharyngeal exudate.  Nares are injected and congested  bilat maxillary sinus tenderness  Eyes: Conjunctivae and EOM are normal. Pupils are equal, round, and reactive to light. Right eye exhibits no discharge. Left eye exhibits no discharge.  Neck: Normal range of motion. Neck supple.  Cardiovascular: Normal rate and regular rhythm.   Pulmonary/Chest: Effort normal and breath sounds normal. No respiratory distress. She has no wheezes. She has no rales. She exhibits no tenderness.  Lymphadenopathy:    She has no cervical adenopathy.  Neurological: She is alert.  Skin: Skin is warm and dry. No rash noted.  Psychiatric: She has a normal mood and affect.          Assessment & Plan:

## 2013-11-08 NOTE — Progress Notes (Signed)
Pre visit review using our clinic review tool, if applicable. No additional management support is needed unless otherwise documented below in the visit note. 

## 2013-11-08 NOTE — Patient Instructions (Signed)
Drink lots of fluids and rest  Take the augmentin for sinus infection  mucinex over the counter may help congestion and nasal saline spray  Warm compress on face can help as well as breathing steam  Update if not starting to improve in a week or if worsening   Please re schedule your physical for about 2-3 weeks (30 min appt)

## 2013-11-10 DIAGNOSIS — J019 Acute sinusitis, unspecified: Secondary | ICD-10-CM | POA: Insufficient documentation

## 2013-11-10 NOTE — Assessment & Plan Note (Signed)
After uri Cover with aumgentin  Disc symptomatic care - see instructions on AVS  Update if not starting to improve in a week or if worsening

## 2013-11-25 ENCOUNTER — Encounter: Payer: 59 | Admitting: Family Medicine

## 2013-11-29 ENCOUNTER — Encounter: Payer: Self-pay | Admitting: Family Medicine

## 2013-11-29 ENCOUNTER — Ambulatory Visit (INDEPENDENT_AMBULATORY_CARE_PROVIDER_SITE_OTHER): Payer: Medicare Other | Admitting: Family Medicine

## 2013-11-29 VITALS — BP 130/86 | HR 71 | Temp 98.1°F | Ht 63.5 in | Wt 221.5 lb

## 2013-11-29 DIAGNOSIS — Z23 Encounter for immunization: Secondary | ICD-10-CM

## 2013-11-29 DIAGNOSIS — E2839 Other primary ovarian failure: Secondary | ICD-10-CM | POA: Insufficient documentation

## 2013-11-29 DIAGNOSIS — Z8262 Family history of osteoporosis: Secondary | ICD-10-CM | POA: Insufficient documentation

## 2013-11-29 DIAGNOSIS — E785 Hyperlipidemia, unspecified: Secondary | ICD-10-CM

## 2013-11-29 DIAGNOSIS — Z0000 Encounter for general adult medical examination without abnormal findings: Secondary | ICD-10-CM

## 2013-11-29 DIAGNOSIS — R7309 Other abnormal glucose: Secondary | ICD-10-CM

## 2013-11-29 DIAGNOSIS — E669 Obesity, unspecified: Secondary | ICD-10-CM

## 2013-11-29 MED ORDER — FLUOXETINE HCL 40 MG PO CAPS: 40.0000 mg | ORAL_CAPSULE | Freq: Every day | ORAL | Status: DC

## 2013-11-29 NOTE — Progress Notes (Signed)
Subjective:    Patient ID: Deborah Carey, female    DOB: 1951-04-17, 63 y.o.   MRN: [id number]  HPI I have personally reviewed the Medicare Annual Wellness questionnaire and have noted 1. The patient's medical and social history 2. Their use of alcohol, tobacco or illicit drugs 3. Their current medications and supplements 4. The patient's functional ability including ADL's, fall risks, home safety risks and hearing or visual             impairment. 5. Diet and physical activities 6. Evidence for depression or mood disorders  The patients weight, height, BMI have been recorded in the chart and visual acuity is per eye clinic.  I have made referrals, counseling and provided education to the patient based review of the above and I have provided the pt with a written personalized care plan for preventive services.  Nothing new going on  Taking fair care of herself  Wt is down 2-3 lb with bmi 38 - does not know why   Having cataract surgery on wed   See scanned forms.  Routine anticipatory guidance given to patient.  See health maintenance. Colon cancer screening 2/10 - 10year recall  Breast cancer screening mammogram 3/14 -nl -needs to schedule at [facility]  Self breast exam-no lumps or changes  Flu vaccine- usually does not get flu shots  Tetanus vaccine  3/14  Pneumovax - is interested in a pneumonia vaccine - will get this today Zoster vaccine -has not had   Advance directive- has a  Living will set up  Cognitive function addressed- see scanned forms- and if abnormal then additional documentation follows. No major memory problems   PMH and SH reviewed  Meds, vitals, and allergies reviewed.   ROS: See HPI.  Otherwise negative.      dexa 11/07 nl  fam hx of OP Would like to re check this   Pap nl 08 Goes every July to the gyn - with a breast exam  No more bx and no bleeding   Hyperglycemia  Lab Results  Component Value Date   HGBA1C 6.2 10/30/2013   this was 5.8 Knows  she needs to loose weight  Has not done any exercise this year -was sick for a while  [facility] - [facility]   Lab Results  Component Value Date   CHOL 206* 10/30/2013   CHOL 180 10/31/2012   CHOL 172 09/20/2011   Lab Results  Component Value Date   HDL 44.90 10/30/2013   HDL 46.80 10/31/2012   HDL 48.10 09/20/2011   Lab Results  Component Value Date   LDLCALC 116* 10/31/2012   LDLCALC 105* 09/20/2011   LDLCALC 114* 07/07/2010   Lab Results  Component Value Date   TRIG 140.0 10/30/2013   TRIG 84.0 10/31/2012   TRIG 95.0 09/20/2011   Lab Results  Component Value Date   CHOLHDL 5 10/30/2013   CHOLHDL 4 10/31/2012   CHOLHDL 4 09/20/2011   Lab Results  Component Value Date   LDLDIRECT 141.8 10/30/2013   up just a bit from last year  She does eat fatty foods   Patient Active Problem List   Diagnosis Date Noted  . Family history of osteoporosis 11/29/2013  . Estrogen deficiency 11/29/2013  . Hyperlipidemia 11/29/2013  . Acute sinusitis 11/10/2013  . Encounter for Medicare annual wellness exam 10/29/2013  . Irregular heartbeat 10/07/2013  . Left thyroid nodule 09/26/2013  .  Enlarged thyroid 09/16/2013  . Primary osteoarthritis of both knees 11/05/2012  . Other screening mammogram 09/27/2011  . Routine general medical examination at a health care facility 09/19/2011  . OBESITY 06/06/2008  . HYPERGLYCEMIA 06/06/2008  . ANXIETY 04/17/2007  . ALLERGIC RHINITIS 04/17/2007   Past Medical History  Diagnosis Date  . Allergy     allergic rhinitis  . Anxiety   . Post-menopausal bleeding     neg endo bx.  . Infertility, female   . Osteoarthritis of both knees    Past Surgical History  Procedure Laterality Date  . Foot surgery  1998    rt heel spur removal  . Refractive surgery  08/1999  . Facial cosmetic surgery  2006    face lift  . Endometrial biopsy      neg for CA cells  . Cesarean section      times 2  .  Cataract surgery  2011   History  Substance Use Topics  . Smoking status: Never Smoker   . Smokeless tobacco: Not on file  . Alcohol Use: No     Comment: occasional alcohol   Family History  Problem Relation Age of Onset  . Stroke Mother   . Hypertension Mother   . Osteoporosis Mother   . Alzheimer's disease Father   . Hypertension Father   . Osteoporosis Father   . Depression Sister   . Hypertension Sister   . Osteoporosis Sister   . Thyroid disease Sister   . COPD Brother   . Parkinson's disease Sister   . Osteoporosis Sister    No Known Allergies Current Outpatient Prescriptions on File Prior to Visit  Medication Sig Dispense Refill  . albuterol (PROVENTIL HFA;VENTOLIN HFA) 108 (90 BASE) MCG/ACT inhaler Inhale 2 puffs into the lungs every 4 (four) hours as needed.  1 Inhaler  5  . CALCIUM PO Take by mouth as needed.       . cetirizine (ZYRTEC) 10 MG chewable tablet Chew 10 mg by mouth as needed for allergies.      . fluticasone (FLONASE) 50 MCG/ACT nasal spray Place 2 sprays into the nose as needed.      Marland Kitchen ibuprofen (ADVIL,MOTRIN) 200 MG tablet Take 200 mg by mouth as needed for pain.      . Multiple Vitamins-Minerals (MULTIVITAMIN PO) Take by mouth as needed.       . naproxen sodium (ANAPROX) 220 MG tablet Take 220 mg by mouth as needed.      . Omega-3 Fatty Acids (FISH OIL PO) Take by mouth as needed.       . vitamin C (ASCORBIC ACID) 500 MG tablet Take 500 mg by mouth daily.       No current facility-administered medications on file prior to visit.    Review of Systems    Review of Systems  Constitutional: Negative for fever, appetite change, fatigue and unexpected weight change.  Eyes: Negative for pain and visual disturbance.  Respiratory: Negative for cough and shortness of breath.   Cardiovascular: Negative for cp or palpitations    Gastrointestinal: Negative for nausea, diarrhea and constipation.  Genitourinary: Negative for urgency and frequency.  Skin:  Negative for pallor or rash   Neurological: Negative for weakness, light-headedness, numbness and headaches.  Hematological: Negative for adenopathy. Does not bruise/bleed easily.  Psychiatric/Behavioral: Negative for dysphoric mood. The patient is not nervous/anxious.      Objective:   Physical Exam  Constitutional: She appears well-developed and well-nourished. No distress.  obese and well appearing   HENT:  Head: Normocephalic and atraumatic.  Right Ear: External ear normal.  Left Ear: External ear normal.  Nose: Nose normal.  Mouth/Throat: Oropharynx is clear and moist.  Eyes: Conjunctivae and EOM are normal. Pupils are equal, round, and reactive to light. Right eye exhibits no discharge. Left eye exhibits no discharge. No scleral icterus.  Neck: Normal range of motion. Neck supple. No JVD present. Carotid bruit is not present. Thyromegaly present.  Cardiovascular: Normal rate, regular rhythm, normal heart sounds and intact distal pulses.  Exam reveals no gallop.   Pulmonary/Chest: Effort normal and breath sounds normal. No respiratory distress. She has no wheezes. She has no rales.  Abdominal: Soft. Bowel sounds are normal. She exhibits no distension, no abdominal bruit and no mass. There is no tenderness.  Musculoskeletal: She exhibits no edema and no tenderness.  Lymphadenopathy:    She has no cervical adenopathy.  Neurological: She is alert. She has normal reflexes. No cranial nerve deficit. She exhibits normal muscle tone. Coordination normal.  Skin: Skin is warm and dry. No rash noted. No erythema. No pallor.  Psychiatric: She has a normal mood and affect.          Assessment & Plan:

## 2013-11-29 NOTE — Patient Instructions (Signed)
Don't forget to schedule your annual mammogram  If you are interested in a shingles/zoster vaccine - call your insurance to check on coverage,( you should not get it within 1 month of other vaccines) , then call us for a prescription  for it to take to a pharmacy that gives the shot , or make a nurse visit to get it here depending on your coverage   Pneumonia vaccine today  Stop up front for a referral for a bone density test Avoid red meat/ fried foods/ egg yolks/ fatty breakfast meats/ butter, cheese and high fat dairy/ and shellfish   Avoid sugar in diet as much as you can  Start regular exercise  Follow up with me in about 6 months with labs prior  You will be due for a thyroid ultrasound after 10/2014   Fat and Cholesterol Control Diet Fat and cholesterol levels in your blood and organs are influenced by your diet. High levels of fat and cholesterol may lead to diseases of the heart, small and large blood vessels, gallbladder, liver, and pancreas. CONTROLLING FAT AND CHOLESTEROL WITH DIET Although exercise and lifestyle factors are important, your diet is key. That is because certain foods are known to raise cholesterol and others to lower it. The goal is to balance foods for their effect on cholesterol and more importantly, to replace saturated and trans fat with other types of fat, such as monounsaturated fat, polyunsaturated fat, and omega-3 fatty acids. On average, a person should consume no more than 15 to 17 g of saturated fat daily. Saturated and trans fats are considered "bad" fats, and they will raise LDL cholesterol. Saturated fats are primarily found in animal products such as meats, butter, and cream. However, that does not mean you need to give up all your favorite foods. Today, there are good tasting, low-fat, low-cholesterol substitutes for most of the things you like to eat. Choose low-fat or nonfat alternatives. Choose round or loin cuts of red meat. These types of cuts are  lowest in fat and cholesterol. Chicken (without the skin), fish, veal, and ground [place] breast are great choices. Eliminate fatty meats, such as hot dogs and salami. Even shellfish have little or no saturated fat. Have a 3 oz (85 g) portion when you eat lean meat, poultry, or fish. Trans fats are also called "partially hydrogenated oils." They are oils that have been scientifically manipulated so that they are solid at room temperature resulting in a longer shelf life and improved taste and texture of foods in which they are added. Trans fats are found in stick margarine, some tub margarines, cookies, crackers, and baked goods.  When baking and cooking, oils are a great substitute for butter. The monounsaturated oils are especially beneficial since it is believed they lower LDL and raise HDL. The oils you should avoid entirely are saturated tropical oils, such as coconut and palm.  Remember to eat a lot from food groups that are naturally free of saturated and trans fat, including fish, fruit, vegetables, beans, grains (barley, rice, couscous, bulgur wheat), and pasta (without cream sauces).  IDENTIFYING FOODS THAT LOWER FAT AND CHOLESTEROL  Soluble fiber may lower your cholesterol. This type of fiber is found in fruits such as apples, vegetables such as broccoli, potatoes, and carrots, legumes such as beans, peas, and lentils, and grains such as barley. Foods fortified with plant sterols (phytosterol) may also lower cholesterol. You should eat at least 2 g per day of these foods for a cholesterol  lowering effect.  Read package labels to identify low-saturated fats, trans fat free, and low-fat foods at the supermarket. Select cheeses that have only 2 to 3 g saturated fat per ounce. Use a heart-healthy tub margarine that is free of trans fats or partially hydrogenated oil. When buying baked goods (cookies, crackers), avoid partially hydrogenated oils. Breads and muffins should be made from whole grains  (whole-wheat or whole oat flour, instead of "flour" or "enriched flour"). Buy non-creamy canned soups with reduced salt and no added fats.  FOOD PREPARATION TECHNIQUES  Never deep-fry. If you must fry, either stir-fry, which uses very little fat, or use non-stick cooking sprays. When possible, broil, bake, or roast meats, and steam vegetables. Instead of putting butter or margarine on vegetables, use lemon and herbs, applesauce, and cinnamon (for squash and sweet potatoes). Use nonfat yogurt, salsa, and low-fat dressings for salads.  LOW-SATURATED FAT / LOW-FAT FOOD SUBSTITUTES Meats / Saturated Fat (g)  Avoid: Steak, marbled (3 oz/85 g) / 11 g  Choose: Steak, lean (3 oz/85 g) / 4 g  Avoid: Hamburger (3 oz/85 g) / 7 g  Choose: Hamburger, lean (3 oz/85 g) / 5 g  Avoid: Ham (3 oz/85 g) / 6 g  Choose: Ham, lean cut (3 oz/85 g) / 2.4 g  Avoid: Chicken, with skin, dark meat (3 oz/85 g) / 4 g  Choose: Chicken, skin removed, dark meat (3 oz/85 g) / 2 g  Avoid: Chicken, with skin, light meat (3 oz/85 g) / 2.5 g  Choose: Chicken, skin removed, light meat (3 oz/85 g) / 1 g Dairy / Saturated Fat (g)  Avoid: Whole milk (1 cup) / 5 g  Choose: Low-fat milk, 2% (1 cup) / 3 g  Choose: Low-fat milk, 1% (1 cup) / 1.5 g  Choose: Skim milk (1 cup) / 0.3 g  Avoid: Hard cheese (1 oz/28 g) / 6 g  Choose: Skim milk cheese (1 oz/28 g) / 2 to 3 g  Avoid: Cottage cheese, 4% fat (1 cup) / 6.5 g  Choose: Low-fat cottage cheese, 1% fat (1 cup) / 1.5 g  Avoid: Ice cream (1 cup) / 9 g  Choose: Sherbet (1 cup) / 2.5 g  Choose: Nonfat frozen yogurt (1 cup) / 0.3 g  Choose: Frozen fruit bar / trace  Avoid: Whipped cream (1 tbs) / 3.5 g  Choose: Nondairy whipped topping (1 tbs) / 1 g Condiments / Saturated Fat (g)  Avoid: Mayonnaise (1 tbs) / 2 g  Choose: Low-fat mayonnaise (1 tbs) / 1 g  Avoid: Butter (1 tbs) / 7 g  Choose: Extra light margarine (1 tbs) / 1 g  Avoid: Coconut oil (1 tbs)  / 11.8 g  Choose: Olive oil (1 tbs) / 1.8 g  Choose: Corn oil (1 tbs) / 1.7 g  Choose: Safflower oil (1 tbs) / 1.2 g  Choose: Sunflower oil (1 tbs) / 1.4 g  Choose: Soybean oil (1 tbs) / 2.4 g  Choose: Canola oil (1 tbs) / 1 g Document Released: 08/22/2005 Document Revised: 12/17/2012 Document Reviewed: 02/10/2011 [employer] Patient Information 2014 [place], [place].

## 2013-11-29 NOTE — Progress Notes (Signed)
Pre visit review using our clinic review tool, if applicable. No additional management support is needed unless otherwise documented below in the visit note. 

## 2013-12-01 NOTE — Assessment & Plan Note (Signed)
Reviewed health habits including diet and exercise and skin cancer prevention Reviewed appropriate screening tests for age  Also reviewed health mt list, fam hx and immunization status , as well as social and family history   See HPI Lab rev Pneumovax today 

## 2013-12-01 NOTE — Assessment & Plan Note (Signed)
Disc need for calcium/ vitamin D/ wt bearing exercise and bone density test every 2 y to monitor Disc safety/ fracture risk in detail   sched dexa

## 2013-12-01 NOTE — Assessment & Plan Note (Signed)
Schedule dexa

## 2013-12-01 NOTE — Assessment & Plan Note (Signed)
Disc goals for lipids and reasons to control them Rev labs with pt Rev low sat fat diet in detail Up a bit Plan made to re check after better habits

## 2013-12-01 NOTE — Assessment & Plan Note (Signed)
Lab Results  Component Value Date   HGBA1C 6.2 10/30/2013    F/u planned Disc low glycemic diet/exercise to prevent DM

## 2013-12-01 NOTE — Assessment & Plan Note (Signed)
Discussed how this problem influences overall health and the risks it imposes  Reviewed plan for weight loss with lower calorie diet (via better food choices and also portion control or program like weight watchers) and exercise building up to or more than 30 minutes 5 days per week including some aerobic activity    

## 2013-12-04 ENCOUNTER — Ambulatory Visit: Payer: Self-pay | Admitting: Ophthalmology

## 2013-12-31 ENCOUNTER — Encounter: Payer: Self-pay | Admitting: Family Medicine

## 2013-12-31 ENCOUNTER — Ambulatory Visit: Payer: Self-pay | Admitting: Family Medicine

## 2013-12-31 LAB — HM DEXA SCAN

## 2014-01-02 ENCOUNTER — Ambulatory Visit: Payer: Self-pay | Admitting: Family Medicine

## 2014-01-02 IMAGING — MG MM DIGITAL SCREENING BILAT W/ CAD
1 series · 5 of 5 positions shown · non-contrast
Comparison: Previous exam(s).

CLINICAL DATA: Screening.

EXAM:
DIGITAL SCREENING BILATERAL MAMMOGRAM WITH CAD

[R CC · right · 5 of 5 slices shown]
[im 1/5]
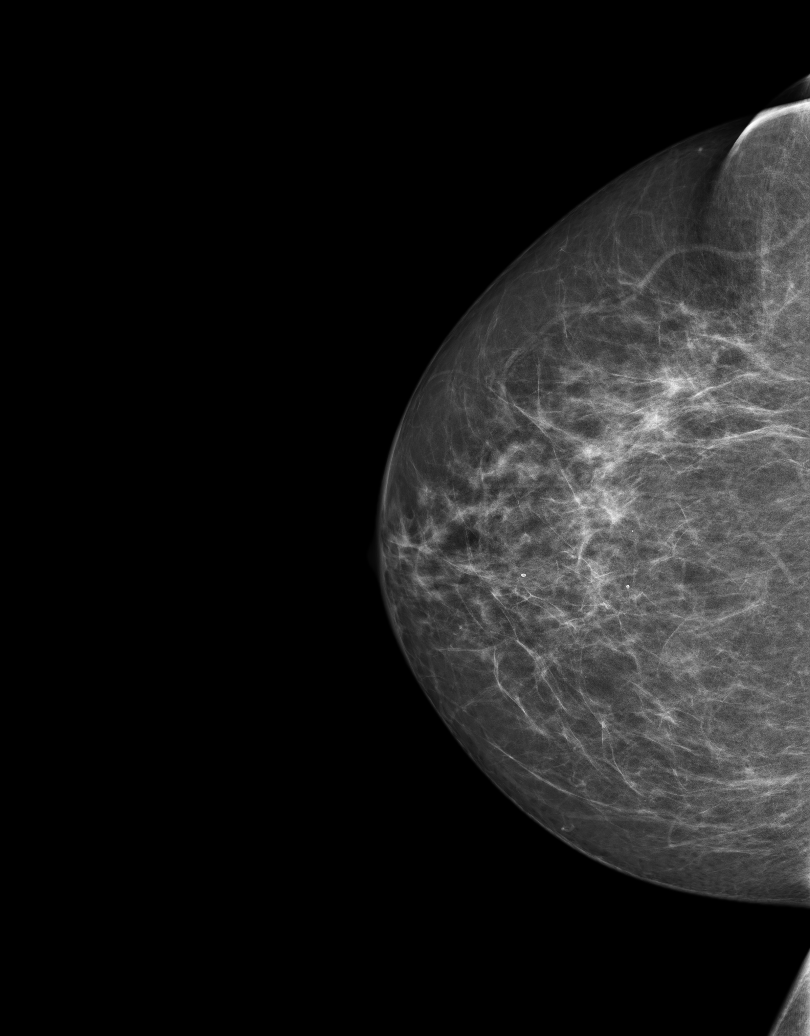
[im 2/5]
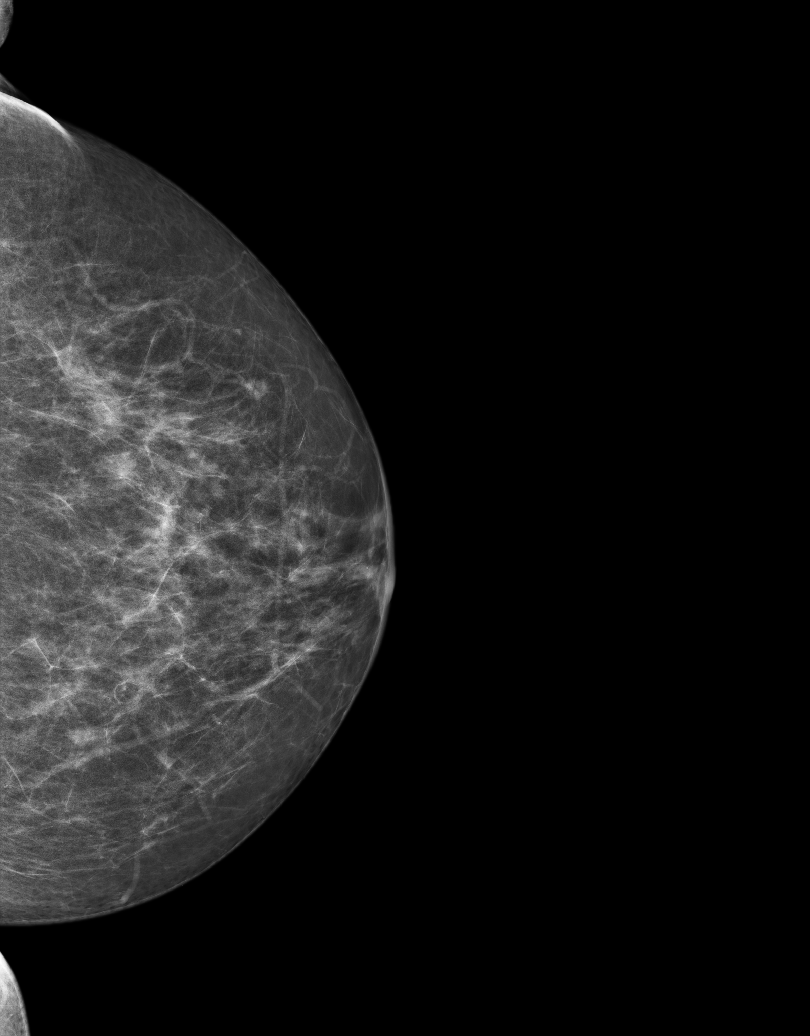
[im 3/5]
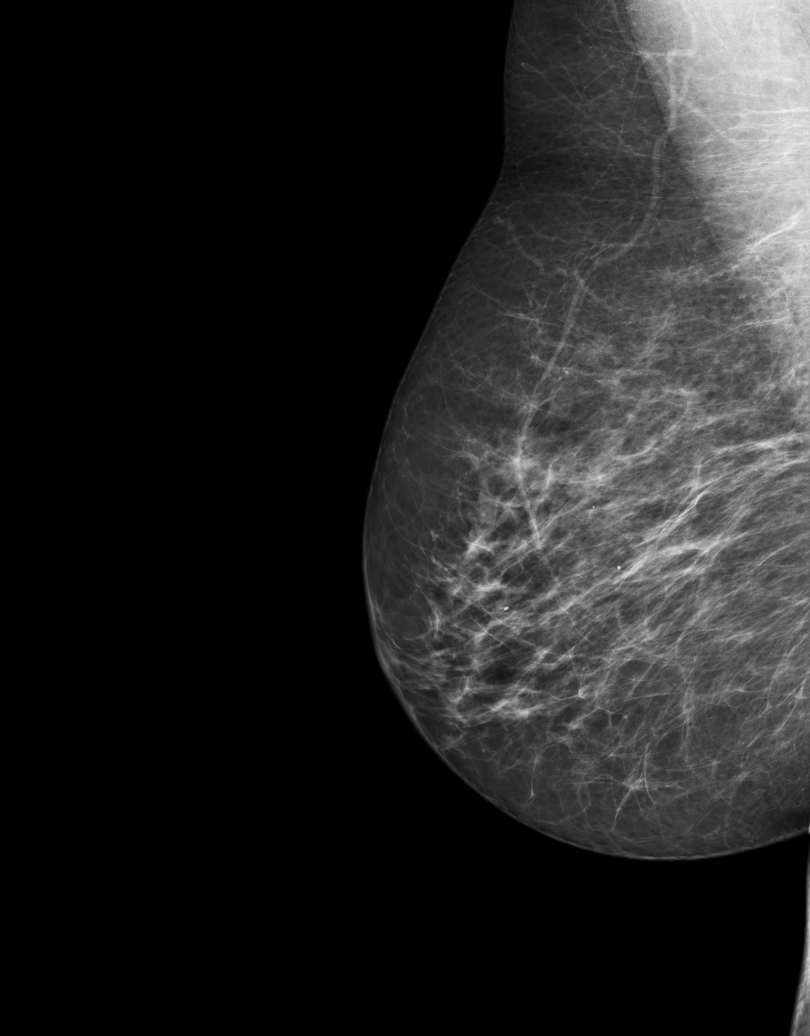
[im 4/5]
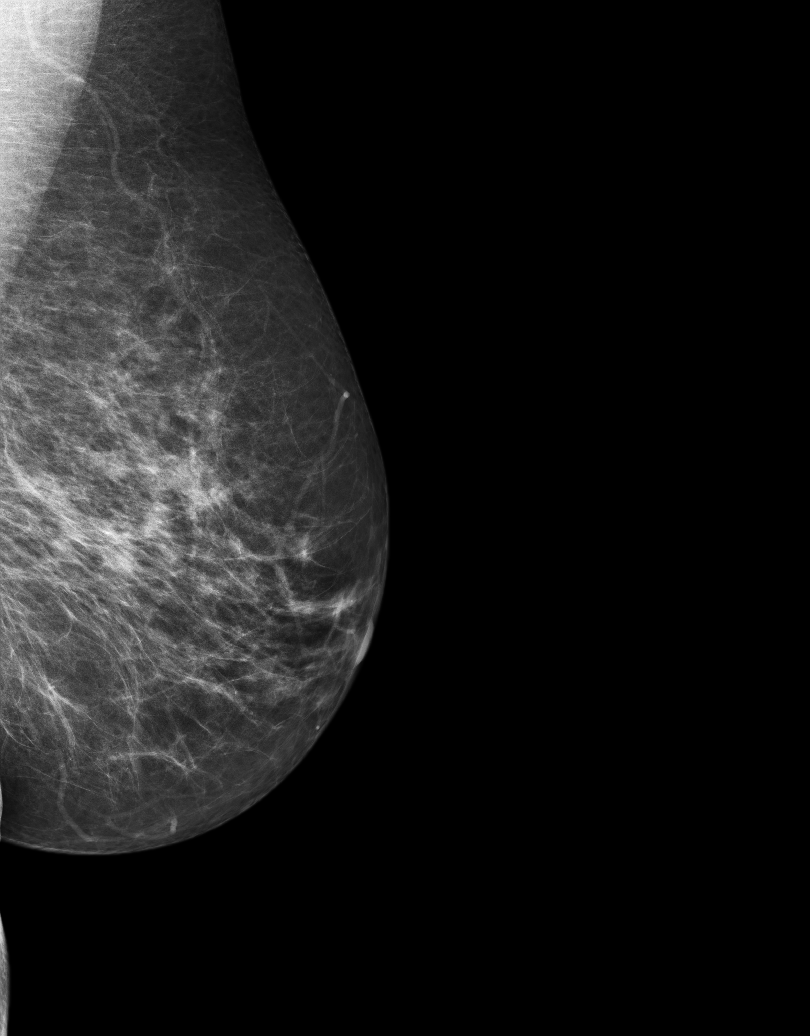
[im 5/5]
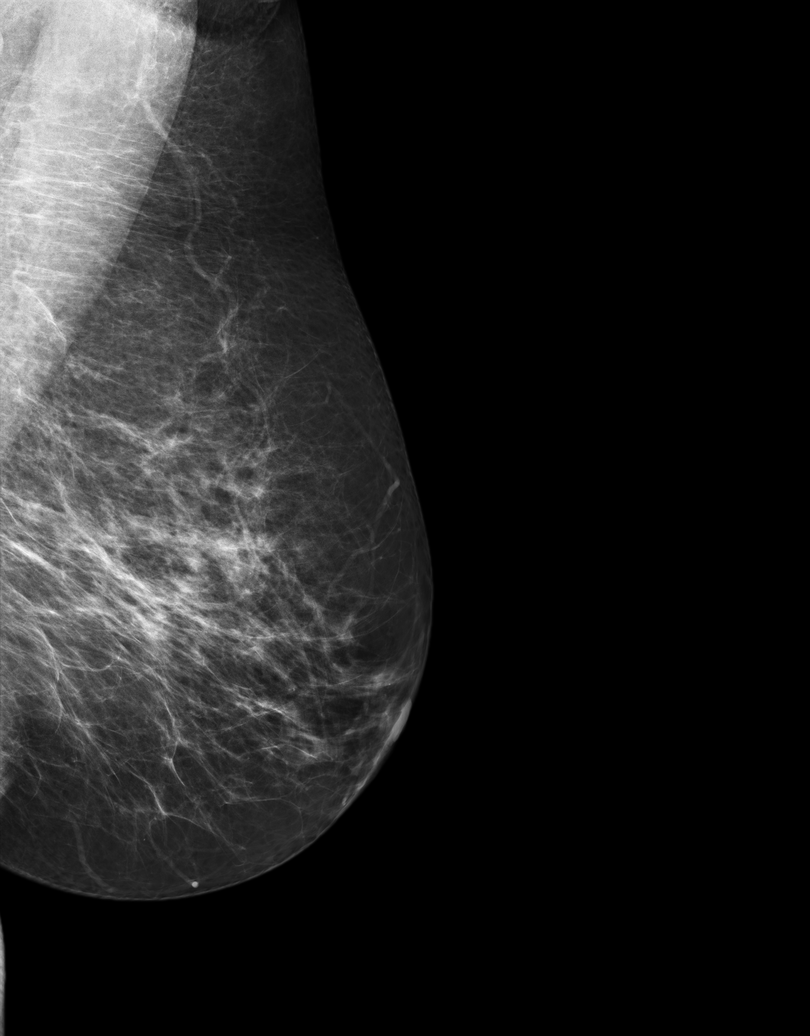

[5 of 5 positions shown; findings below may reference images not displayed]

ACR Breast Density Category b: There are scattered areas of
fibroglandular density.
FINDINGS: There are no findings suspicious for malignancy. Images were
processed with CAD.
IMPRESSION: No mammographic evidence of malignancy. A result letter of this
screening mammogram will be mailed directly to the patient.

RECOMMENDATION:
Screening mammogram in one year. (Code:[US])

BI-RADS CATEGORY  1: Negative.

## 2014-01-03 ENCOUNTER — Encounter: Payer: Self-pay | Admitting: Family Medicine

## 2014-01-07 ENCOUNTER — Encounter: Payer: Self-pay | Admitting: Family Medicine

## 2014-01-07 ENCOUNTER — Encounter: Payer: Self-pay | Admitting: *Deleted

## 2014-01-07 DIAGNOSIS — M8580 Other specified disorders of bone density and structure, unspecified site: Secondary | ICD-10-CM | POA: Insufficient documentation

## 2014-01-20 ENCOUNTER — Encounter: Payer: Self-pay | Admitting: Family Medicine

## 2014-01-20 ENCOUNTER — Ambulatory Visit (INDEPENDENT_AMBULATORY_CARE_PROVIDER_SITE_OTHER): Payer: Medicare Other | Admitting: Family Medicine

## 2014-01-20 ENCOUNTER — Ambulatory Visit (INDEPENDENT_AMBULATORY_CARE_PROVIDER_SITE_OTHER)
Admission: RE | Admit: 2014-01-20 | Discharge: 2014-01-20 | Disposition: A | Discharge status: Home / Self Care | Payer: 59 | Source: Ambulatory Visit | Attending: Family Medicine | Admitting: Family Medicine

## 2014-01-20 VITALS — BP 140/84 | HR 76 | Temp 98.1°F | Wt 216.2 lb

## 2014-01-20 DIAGNOSIS — M25579 Pain in unspecified ankle and joints of unspecified foot: Secondary | ICD-10-CM

## 2014-01-20 DIAGNOSIS — S82402A Unspecified fracture of shaft of left fibula, initial encounter for closed fracture: Secondary | ICD-10-CM

## 2014-01-20 DIAGNOSIS — M79606 Pain in leg, unspecified: Secondary | ICD-10-CM

## 2014-01-20 DIAGNOSIS — M79609 Pain in unspecified limb: Secondary | ICD-10-CM

## 2014-01-20 DIAGNOSIS — S82409A Unspecified fracture of shaft of unspecified fibula, initial encounter for closed fracture: Secondary | ICD-10-CM

## 2014-01-20 IMAGING — CR DG TIBIA/FIBULA 2V*L*
2 series · 2 of 2 positions shown · non-contrast
Comparison: None.

CLINICAL DATA: Fell last night.  Left leg pain.

EXAM:
LEFT TIBIA AND FIBULA - 2 VIEW

[view not recorded (1 of 2)]
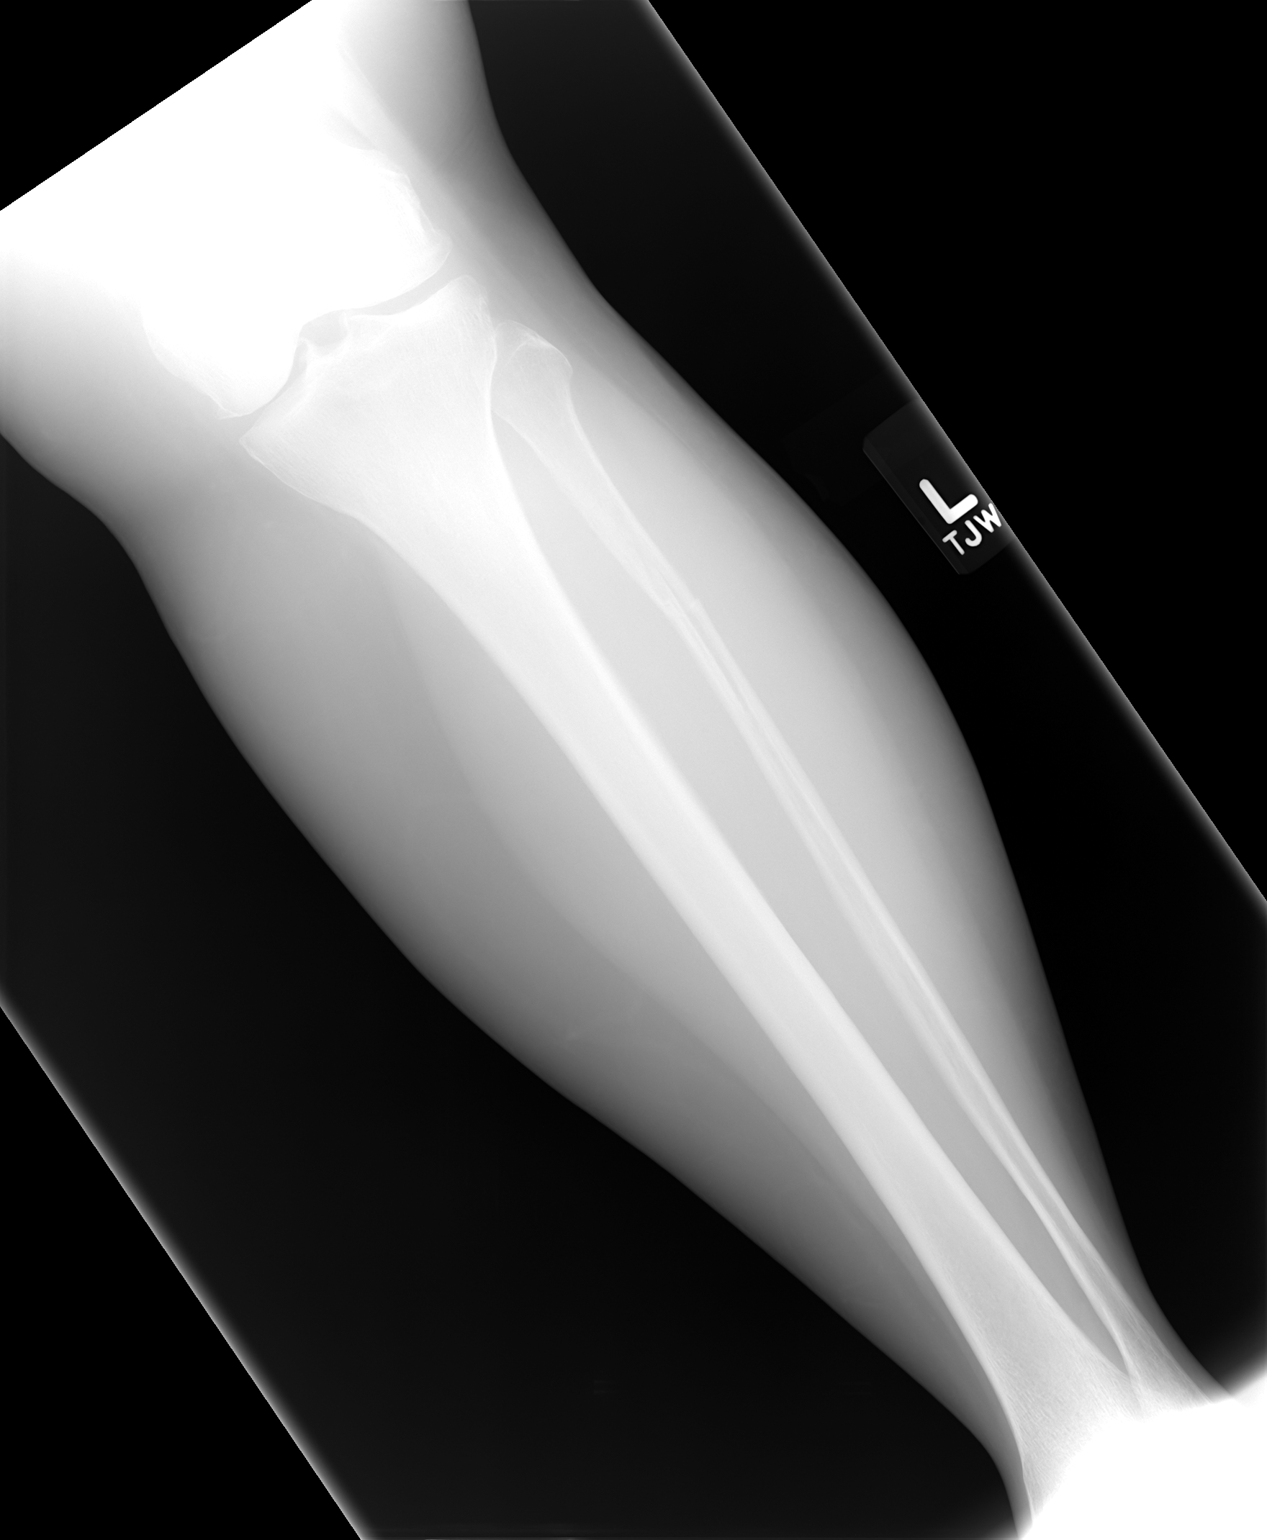

[view not recorded (2 of 2)]
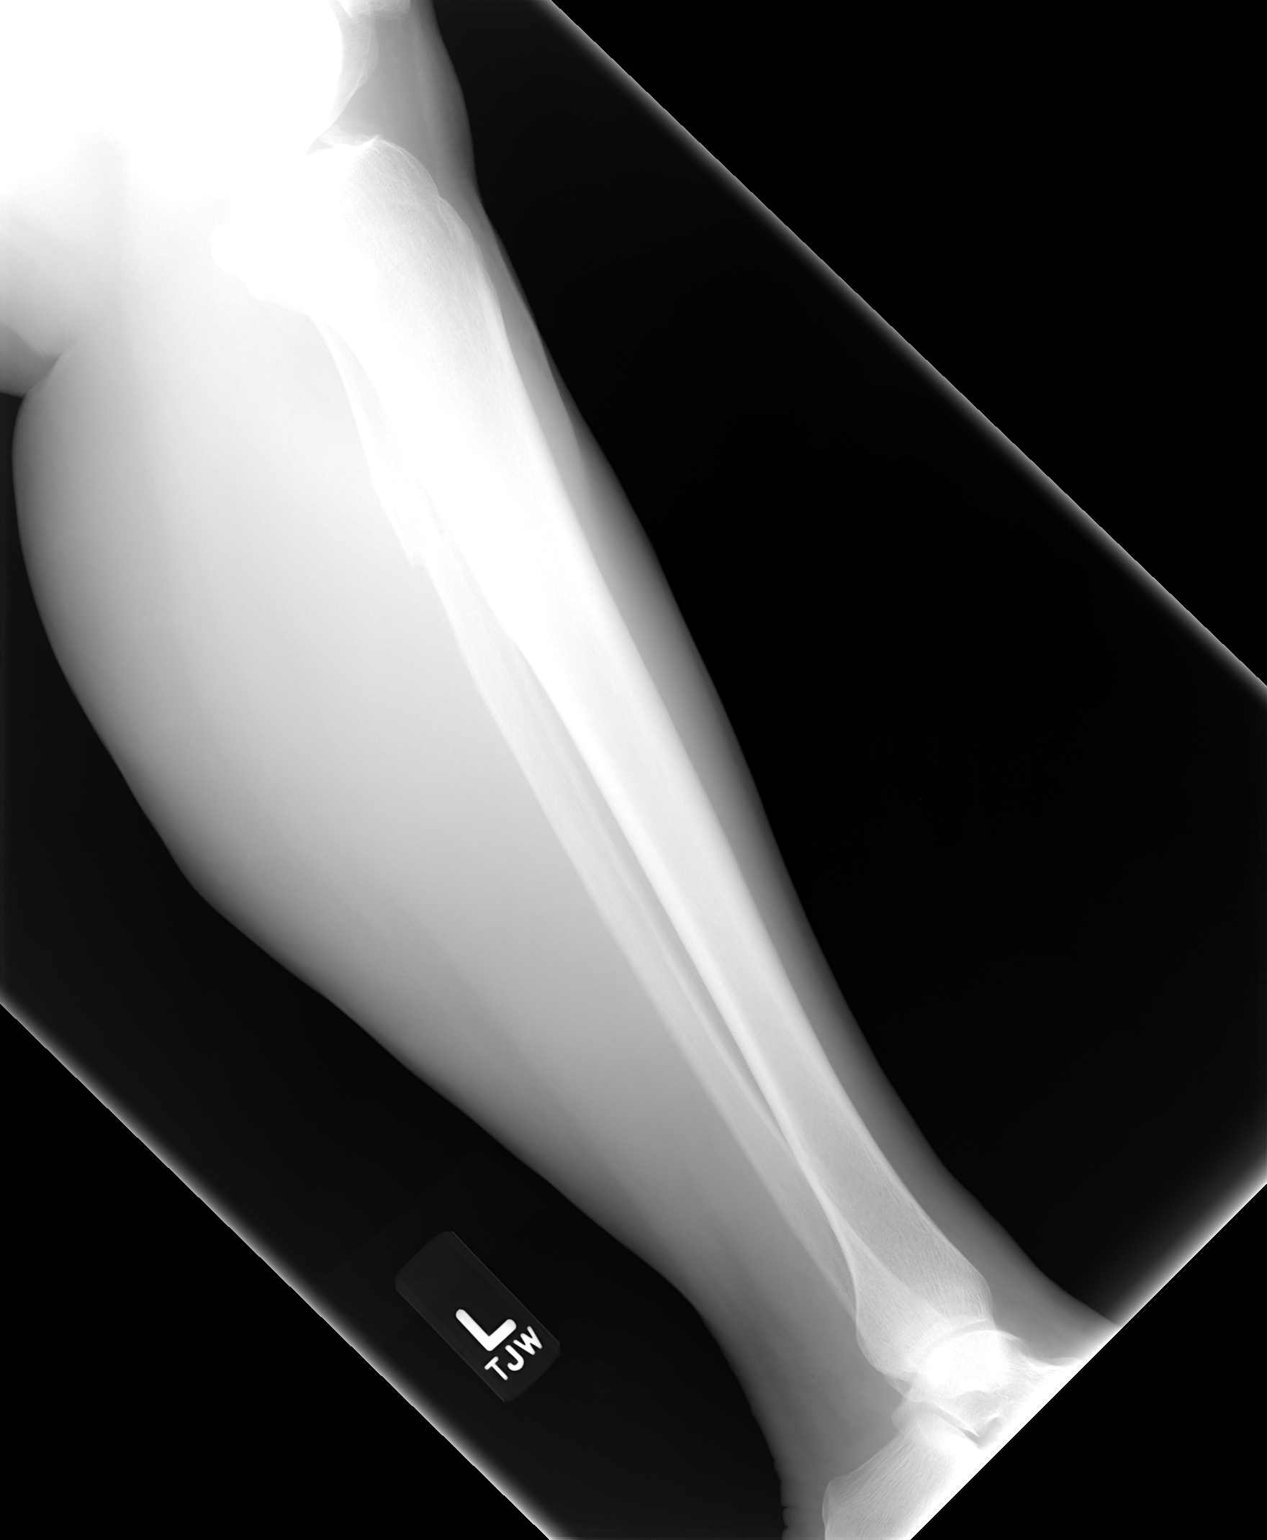

[2 of 2 positions shown; findings below may reference images not displayed]

FINDINGS: There is a fracture of the proximal shaft of the fibula. Fracture is
transverse and non-comminuted. There is slight displacement with the
distal fracture component displacing 4 mm anteriorly.

No other fractures. Knee and ankle joints are normally aligned. No
bone lesion. Soft tissues are unremarkable.
IMPRESSION: Proximal left fibular shaft fracture as described.

## 2014-01-20 IMAGING — CR DG ANKLE COMPLETE 3+V*L*
2 series · 2 of 2 positions shown · non-contrast
Comparison: None.

CLINICAL DATA: Fell last night.  Lateral leg and ankle pain

EXAM:
LEFT ANKLE COMPLETE - 3+ VIEW

[view not recorded (1 of 2)]
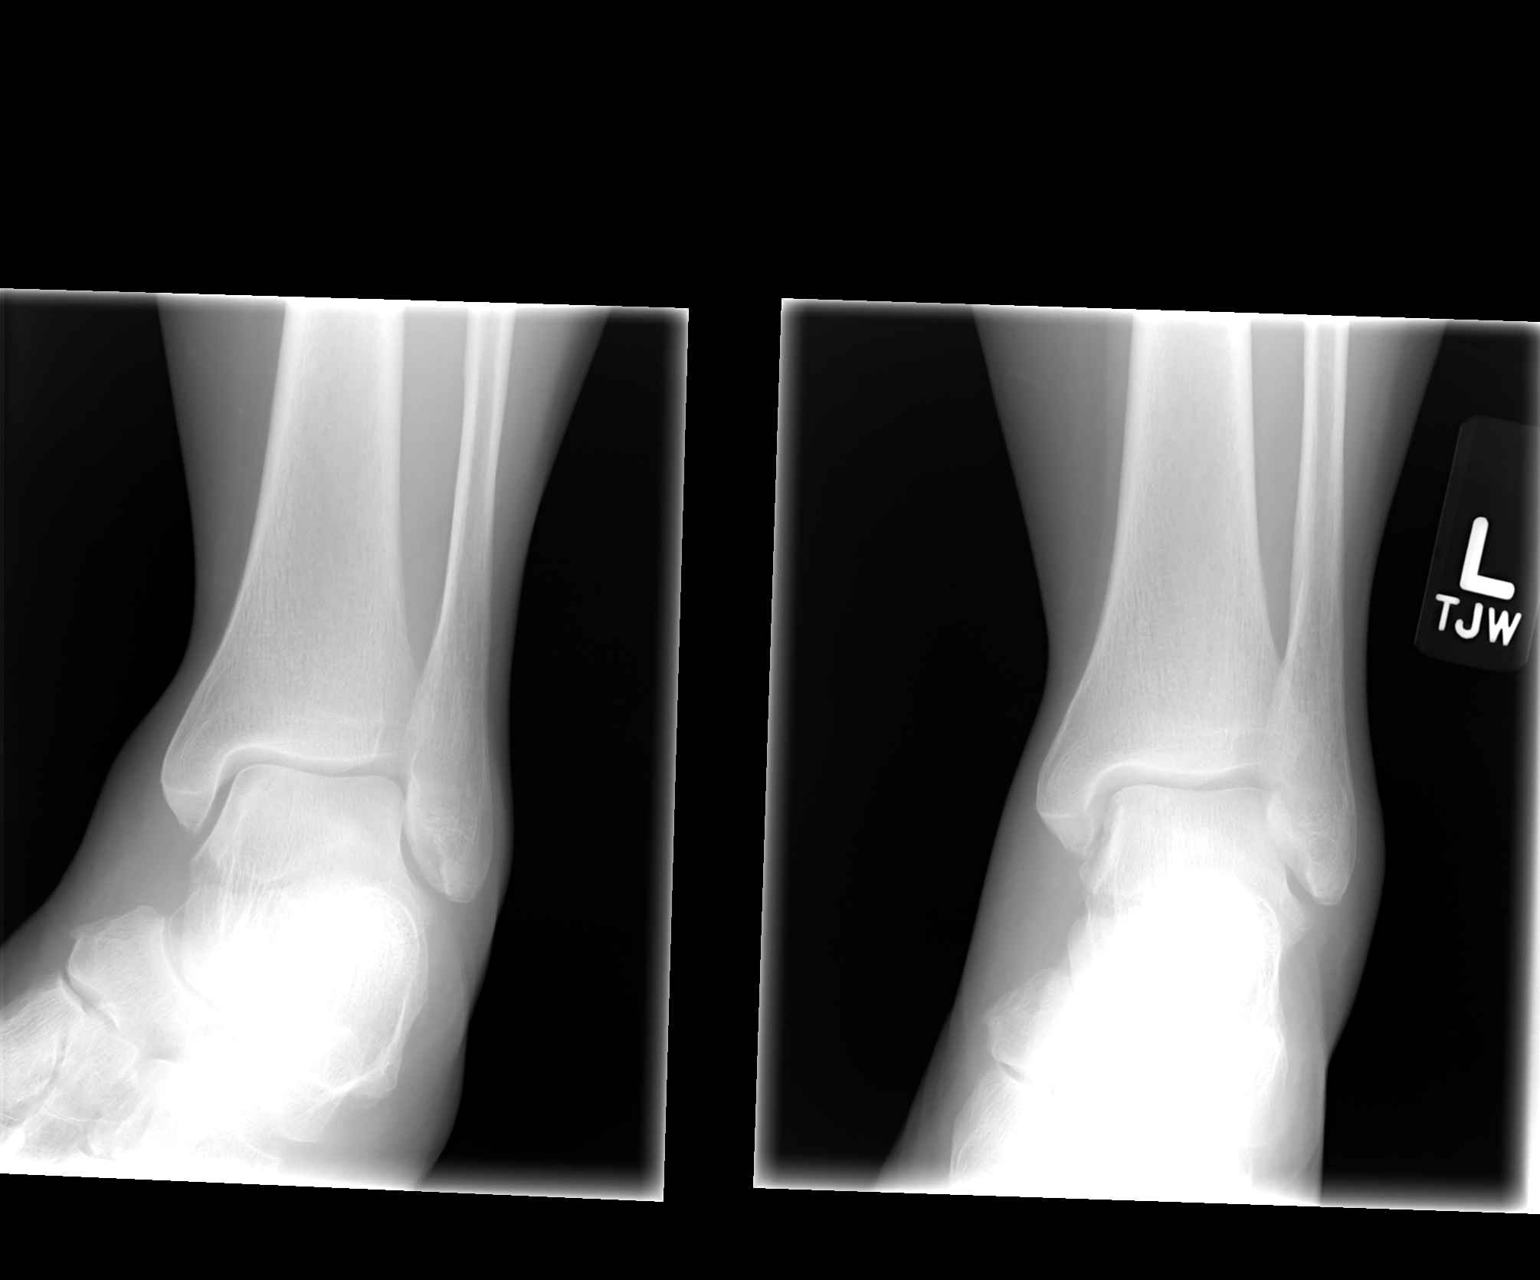

[view not recorded (2 of 2)]
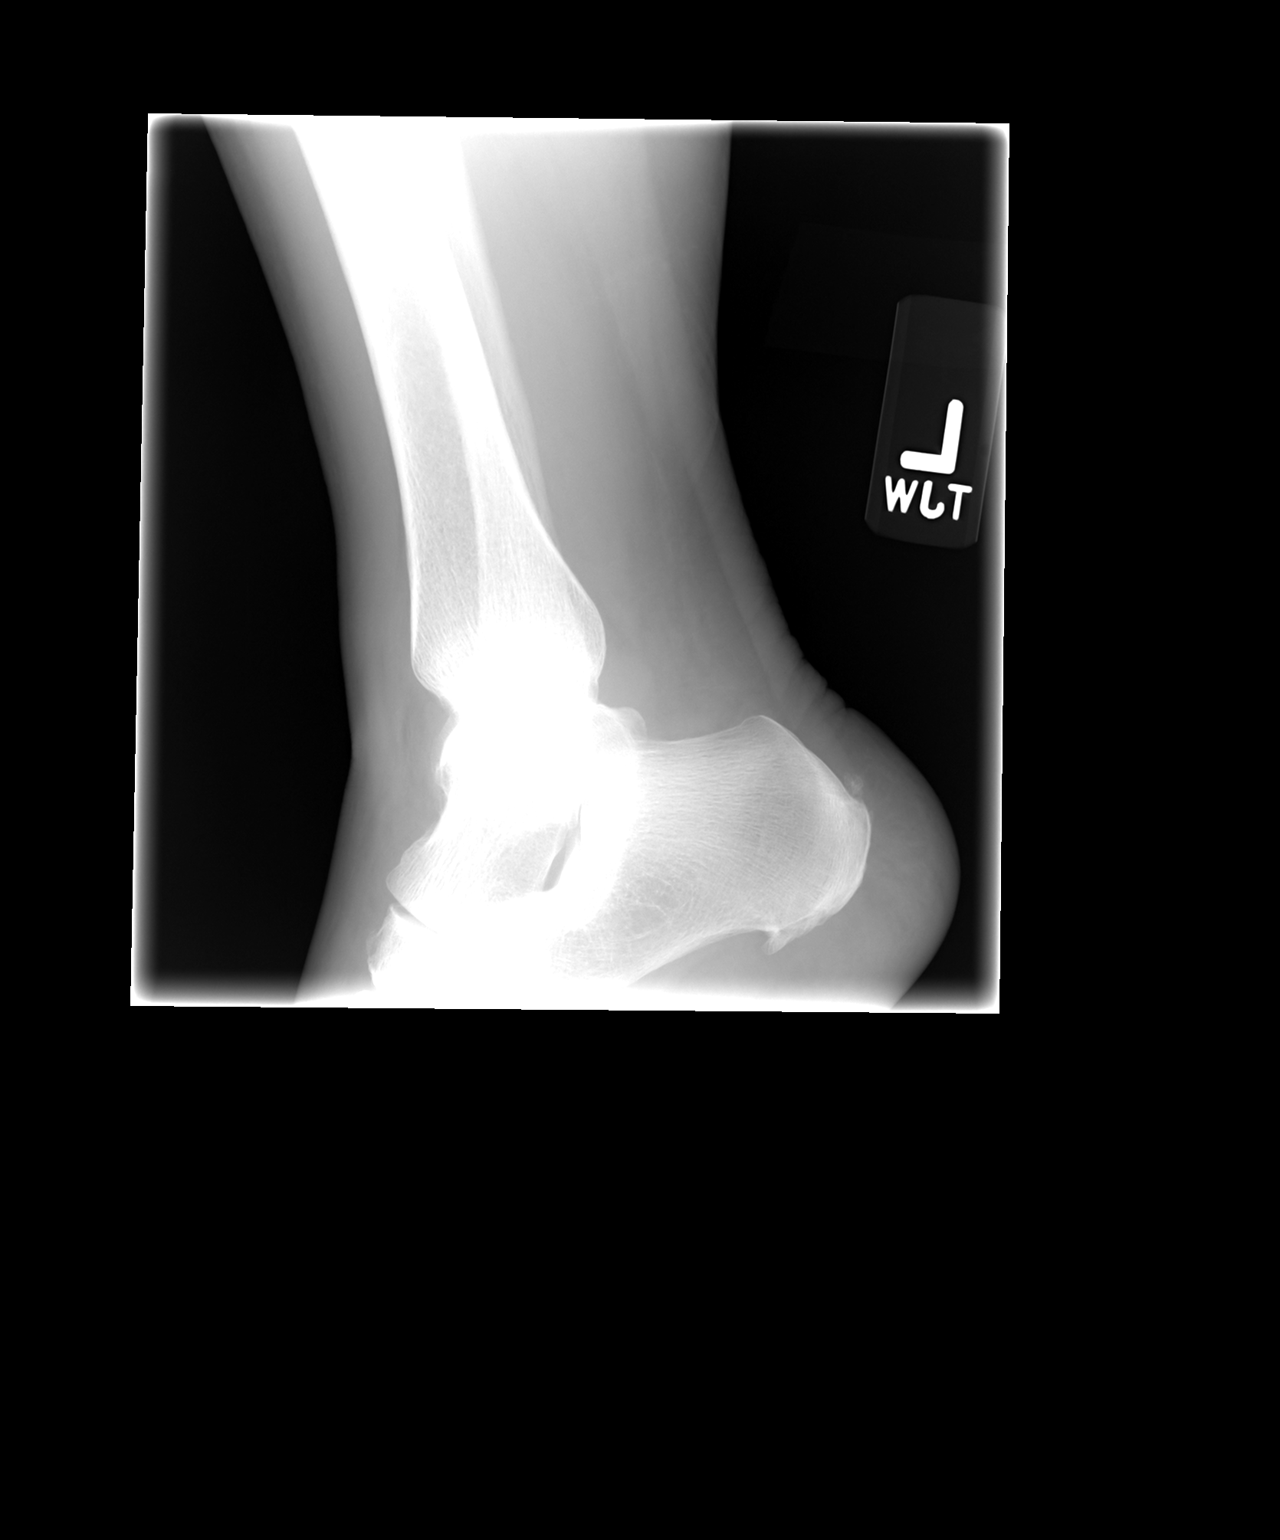

[2 of 2 positions shown; findings below may reference images not displayed]

FINDINGS: On the AP view only, there is irregularity along the medial aspect
of the talus adjacent to the inferior tip of the medial malleolus.
This suggests an avulsion fracture at the deltoid ligament origin.

No other evidence of a fracture. Ankle mortise is normally space and
aligned. There is mild diffuse soft tissue swelling. Small dorsal
and plantar calcaneal spurs are noted.
IMPRESSION: 1. Possible acute avulsion fracture from the medial margin of the
talus. No other evidence of a fracture. Ankle joint normally
aligned.

## 2014-01-20 MED ORDER — HYDROCODONE-ACETAMINOPHEN 5-325 MG PO TABS: 1.0000 | ORAL_TABLET | Freq: Four times a day (QID) | ORAL | Status: DC | PRN

## 2014-01-20 NOTE — Patient Instructions (Signed)
Keep the splint on for now. nonweight bearing for now, use crutches.  Deborah Carey will call about your referral. Use the pain medicine as needed in the meantime.  Take care.

## 2014-01-20 NOTE — Progress Notes (Signed)
Pre visit review using our clinic review tool, if applicable. No additional management support is needed unless otherwise documented below in the visit note.  Fell down the steps last night, it was dark and she just missed a step.  Pain on walking.  Trouble bearing weight.  No LOC.  No R leg pain.   L ankle and calf are ttp.    Meds, vitals, and allergies reviewed.   ROS: See HPI.  Otherwise, noncontributory.  nad L knee not ttp but lateral L calf ttp w/o bruising.  L lateral mal ttp but the dorsum of the ankle isn't ttp Distally nv intact MTs not ttp on L foot

## 2014-01-21 DIAGNOSIS — Z8781 Personal history of (healed) traumatic fracture: Secondary | ICD-10-CM | POA: Insufficient documentation

## 2014-01-21 NOTE — Assessment & Plan Note (Signed)
xrays reviewed, see notes on xrays.  Splinted anterior/posterior with ACE wraps.   Tolerated well, more comfortable in splint.  nonweight bearing, crutches, vicodin for pain with routine cautions.  Refer to ortho.  Unclear if avulsion fx is present at the talus.   >25 minutes spent in face to face time with patient, >50% spent in counselling or coordination of care.

## 2014-03-25 ENCOUNTER — Telehealth: Payer: Self-pay | Admitting: Certified Nurse Midwife

## 2014-03-25 ENCOUNTER — Ambulatory Visit: Payer: 59 | Admitting: Certified Nurse Midwife

## 2014-03-25 NOTE — Telephone Encounter (Signed)
This patient came in for her AEX but has a new Medicare replacement plan that she thinks will only cover her visit from her PCP for a physical. Patient cancelled for today and will call her insurance company to verify coverage then decide what to do. The patient will call if she has any problems or questions. Patient in recall as a reminder she is due. FYI only.

## 2014-03-25 NOTE — Telephone Encounter (Signed)
Agree  encounter closed

## 2014-04-15 ENCOUNTER — Encounter: Payer: 59 | Admitting: Family Medicine

## 2014-05-28 ENCOUNTER — Other Ambulatory Visit: Payer: 59

## 2014-06-04 ENCOUNTER — Ambulatory Visit: Payer: 59 | Admitting: Family Medicine

## 2014-07-07 ENCOUNTER — Encounter: Payer: Self-pay | Admitting: Family Medicine

## 2014-08-26 ENCOUNTER — Encounter: Payer: Self-pay | Admitting: Family Medicine

## 2014-08-26 ENCOUNTER — Ambulatory Visit (INDEPENDENT_AMBULATORY_CARE_PROVIDER_SITE_OTHER): Payer: Medicare Other | Admitting: Family Medicine

## 2014-08-26 VITALS — BP 126/80 | HR 64 | Temp 98.1°F | Ht 63.5 in | Wt 211.5 lb

## 2014-08-26 DIAGNOSIS — J0100 Acute maxillary sinusitis, unspecified: Secondary | ICD-10-CM

## 2014-08-26 MED ORDER — ALBUTEROL SULFATE HFA 108 (90 BASE) MCG/ACT IN AERS: 2.0000 | INHALATION_SPRAY | RESPIRATORY_TRACT | Status: DC | PRN

## 2014-08-26 MED ORDER — AMOXICILLIN-POT CLAVULANATE 875-125 MG PO TABS: 1.0000 | ORAL_TABLET | Freq: Two times a day (BID) | ORAL | Status: DC

## 2014-08-26 MED ORDER — HYDROCODONE-HOMATROPINE 5-1.5 MG/5ML PO SYRP: 5.0000 mL | ORAL_SOLUTION | Freq: Three times a day (TID) | ORAL | Status: DC | PRN

## 2014-08-26 NOTE — Progress Notes (Signed)
Subjective:    Patient ID: Deborah Carey, female    DOB: 10-16-1950, 63 y.o.   MRN: [id number]  HPI Here with uri symptoms  9 days  Has not been out of her house in a week  Cough - some phlegm/ not a lot / pale mucous   Congestion nasal/ sinus headaches  Fever - has been up to 100.5 -- nl now (had body aches and could not do anything) Exhausted  Last time she took it was Saturday   Did not get a flu shot   Patient Active Problem List   Diagnosis Date Noted  . Left fibular fracture 01/21/2014  . Osteopenia 01/07/2014  . Family history of osteoporosis 11/29/2013  . Estrogen deficiency 11/29/2013  . Hyperlipidemia 11/29/2013  . Acute sinusitis 11/10/2013  . Encounter for Medicare annual wellness exam 10/29/2013  . Irregular heartbeat 10/07/2013  . Left thyroid nodule 09/26/2013  . Enlarged thyroid 09/16/2013  . Primary osteoarthritis of both knees 11/05/2012  . Other screening mammogram 09/27/2011  . Routine general medical examination at a health care facility 09/19/2011  . OBESITY 06/06/2008  . HYPERGLYCEMIA 06/06/2008  . ANXIETY 04/17/2007  . ALLERGIC RHINITIS 04/17/2007   Past Medical History  Diagnosis Date  . Allergy     allergic rhinitis  . Anxiety   . Post-menopausal bleeding     neg endo bx.  . Infertility, female   . Osteoarthritis of both knees    Past Surgical History  Procedure Laterality Date  . Foot surgery  1998    rt heel spur removal  . Refractive surgery  08/1999  . Facial cosmetic surgery  2006    face lift  . Endometrial biopsy      neg for CA cells  . Cesarean section      times 2  . Cataract surgery  2011   History  Substance Use Topics  . Smoking status: Never Smoker   . Smokeless tobacco: Not on file  . Alcohol Use: 0.0 oz/week    0 Not specified per week     Comment: occasional alcohol   Family History  Problem Relation Age of Onset  . Stroke Mother   . Hypertension Mother   . Osteoporosis Mother   . Alzheimer's disease  Father   . Hypertension Father   . Osteoporosis Father   . Depression Sister   . Hypertension Sister   . Osteoporosis Sister   . Thyroid disease Sister   . COPD Brother   . Parkinson's disease Sister   . Osteoporosis Sister    No Known Allergies Current Outpatient Prescriptions on File Prior to Visit  Medication Sig Dispense Refill  . albuterol (PROVENTIL HFA;VENTOLIN HFA) 108 (90 BASE) MCG/ACT inhaler Inhale 2 puffs into the lungs every 4 (four) hours as needed. 1 Inhaler 5  . CALCIUM PO Take by mouth as needed.     . cetirizine (ZYRTEC) 10 MG chewable tablet Chew 10 mg by mouth as needed for allergies.    Marland Kitchen FLUoxetine (PROZAC) 40 MG capsule Take 1 capsule (40 mg total) by mouth daily. 90 capsule 3  . fluticasone (FLONASE) 50 MCG/ACT nasal spray Place 2 sprays into the nose as needed.    Marland Kitchen ibuprofen (ADVIL,MOTRIN) 200 MG tablet Take 200 mg by mouth as needed for pain.    . Multiple Vitamins-Minerals (MULTIVITAMIN PO) Take by mouth as needed.     . naproxen sodium (ANAPROX) 220 MG tablet Take 220 mg by mouth as needed.    Marland Kitchen  Omega-3 Fatty Acids (FISH OIL PO) Take by mouth as needed.     . vitamin C (ASCORBIC ACID) 500 MG tablet Take 500 mg by mouth daily.     No current facility-administered medications on file prior to visit.     Review of Systems Review of Systems  Constitutional: pos for malaise / neg for fever today ENt pos for congestion/rhinorrhea and L sided facial pain   Eyes: Negative for pain and visual disturbance.  Respiratory: Negative for shortness of breath.  pos for cough and mild wheezing  Cardiovascular: Negative for cp or palpitations    Gastrointestinal: Negative for nausea, diarrhea and constipation.  Genitourinary: Negative for urgency and frequency.  Skin: Negative for pallor or rash   Neurological: Negative for weakness, light-headedness, numbness and headaches.  Hematological: Negative for adenopathy. Does not bruise/bleed easily.    Psychiatric/Behavioral: Negative for dysphoric mood. The patient is not nervous/anxious.         Objective:   Physical Exam  Constitutional: She appears well-developed and well-nourished. No distress.  overwt and fatigued appearing   HENT:  Head: Normocephalic and atraumatic.  Right Ear: External ear normal.  Left Ear: External ear normal.  Mouth/Throat: Oropharynx is clear and moist.  Nares are injected and congested   L sided maxillary sinus tenderness  Throat clear with drainage  TMs dull  Eyes: Conjunctivae and EOM are normal. Pupils are equal, round, and reactive to light. Right eye exhibits no discharge. Left eye exhibits no discharge.  Neck: Normal range of motion. Neck supple.  Cardiovascular: Normal rate, regular rhythm and normal heart sounds.   Pulmonary/Chest: Effort normal. No respiratory distress. She has wheezes. She has no rales. She exhibits no tenderness.  Good air exch Few scattered rhonchi slt wheeze on forced exp only  Lymphadenopathy:    She has no cervical adenopathy.  Neurological: She is alert. No cranial nerve deficit.  Skin: Skin is warm and dry. No rash noted.  Psychiatric: She has a normal mood and affect.          Assessment & Plan:   Problem List Items Addressed This Visit      Respiratory   Acute sinusitis - Primary    S/p viral uri or possibly flu (9 days)  Cover with augmentin  Hycodan for cough Albuterol prn wheeze -update if worse  Disc symptomatic care - see instructions on AVS  Update if not starting to improve in a week or if worsening       Relevant Medications      amoxicillin-clavulanate (AUGMENTIN) tablet 875-125 mg      HYDROCODONE-HOMATROPINE 5-1.5 MG/5ML PO SYRP

## 2014-08-26 NOTE — Progress Notes (Signed)
Pre visit review using our clinic review tool, if applicable. No additional management support is needed unless otherwise documented below in the visit note. 

## 2014-08-26 NOTE — Patient Instructions (Signed)
Drink lots of fluids and rest  Hycodan for cough with caution of sedation  Albuterol inhaler as needed  Take the augmentin for sinus infection   Update if not starting to improve in a week or if worsening

## 2014-08-26 NOTE — Assessment & Plan Note (Signed)
S/p viral uri or possibly flu (9 days)  Cover with augmentin  Hycodan for cough Albuterol prn wheeze -update if worse  Disc symptomatic care - see instructions on AVS  Update if not starting to improve in a week or if worsening

## 2014-09-18 IMAGING — US US SOFT TISSUE HEAD/NECK
1 series · 13 of 25 positions shown · non-contrast
Comparison: None.

CLINICAL DATA: History of 1 cm left-sided thyroid isthmus nodule.

EXAM:
THYROID ULTRASOUND
TECHNIQUE: Ultrasound examination of the thyroid gland and adjacent soft
tissues was performed.

[Series 1: us soft tissue head/neck · 0.06mm/px · 13 of 35 slices shown]
[im 1/35]
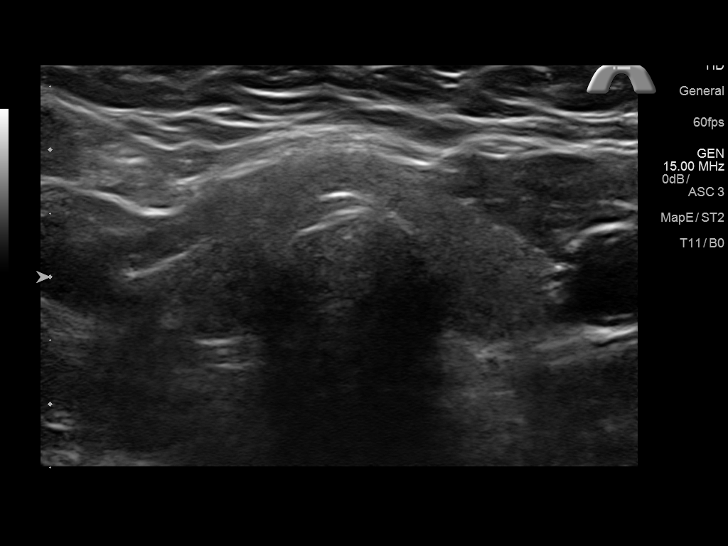
[im 3/35]
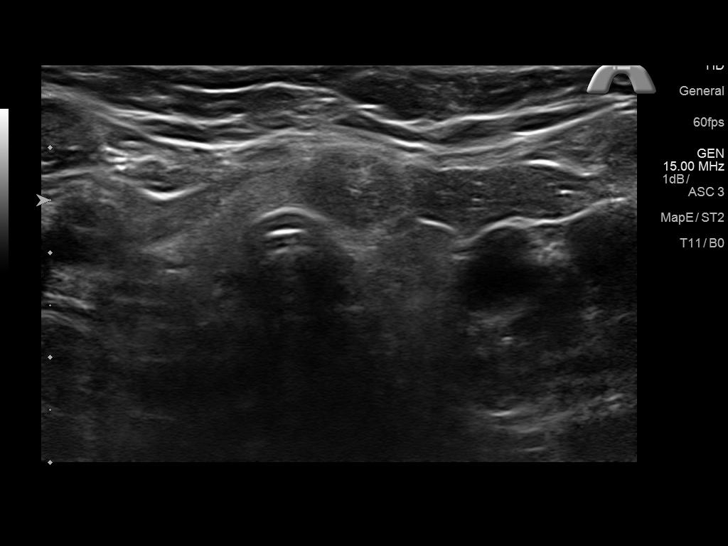
[im 6/35]
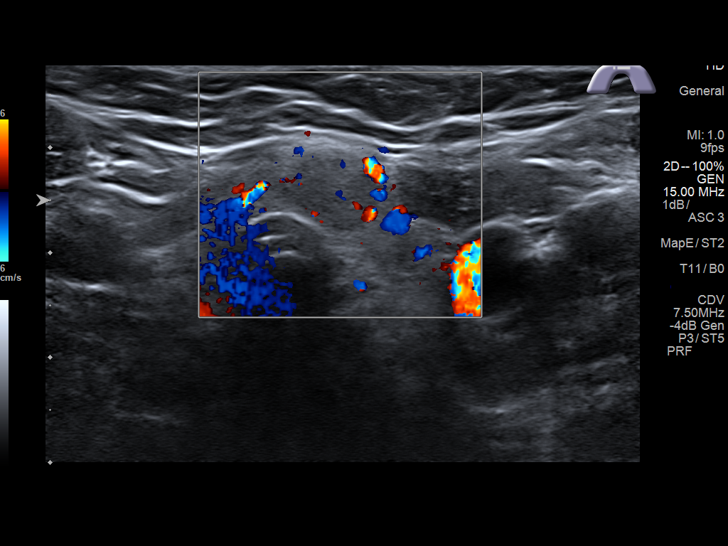
[im 9/35]
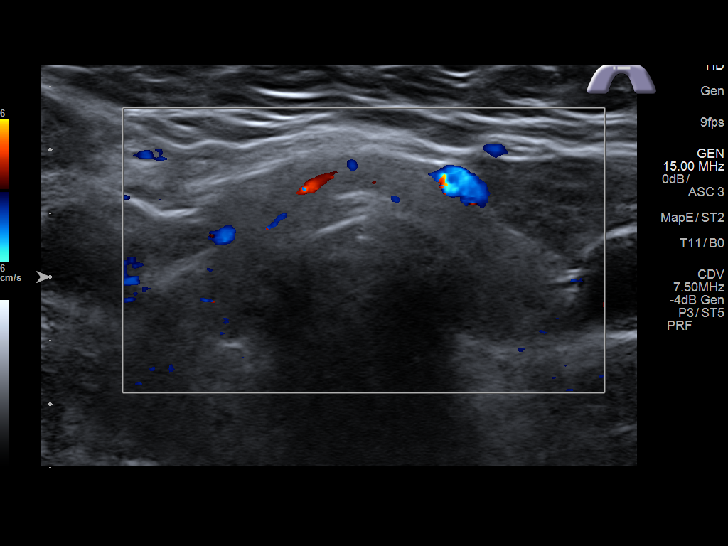
[im 12/35]
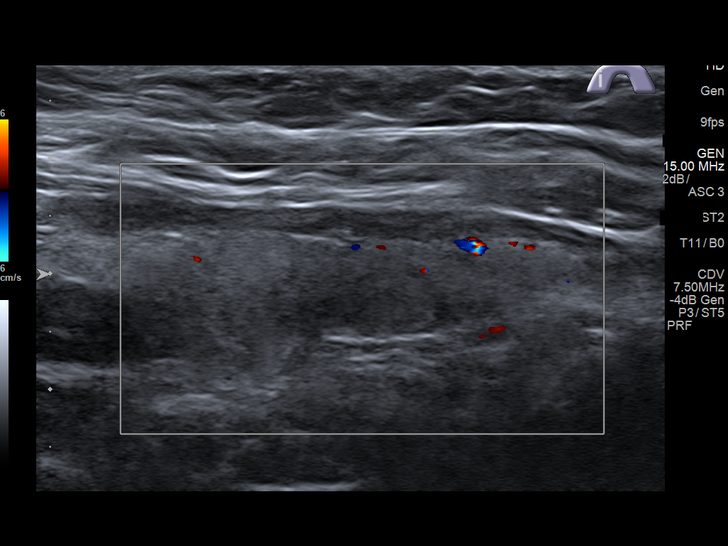
[im 15/35]
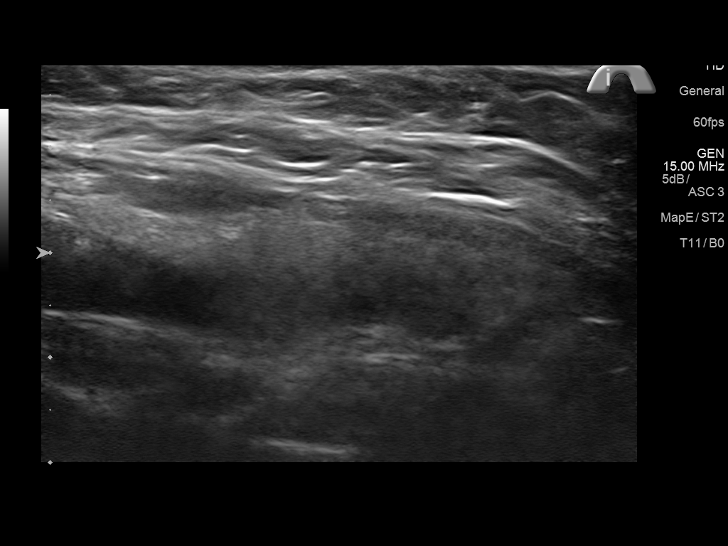
[im 18/35]
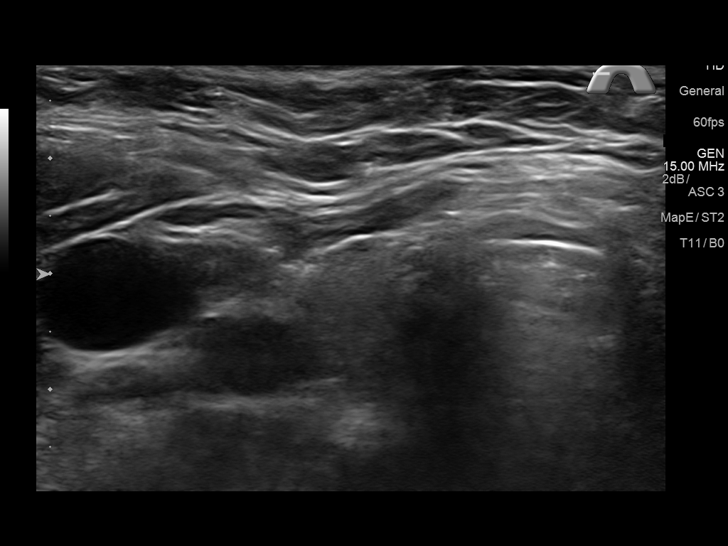
[im 20/35]
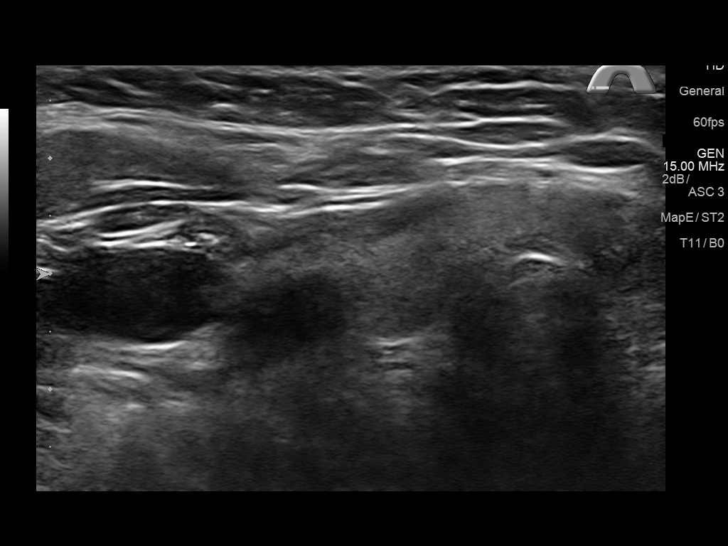
[im 23/35]
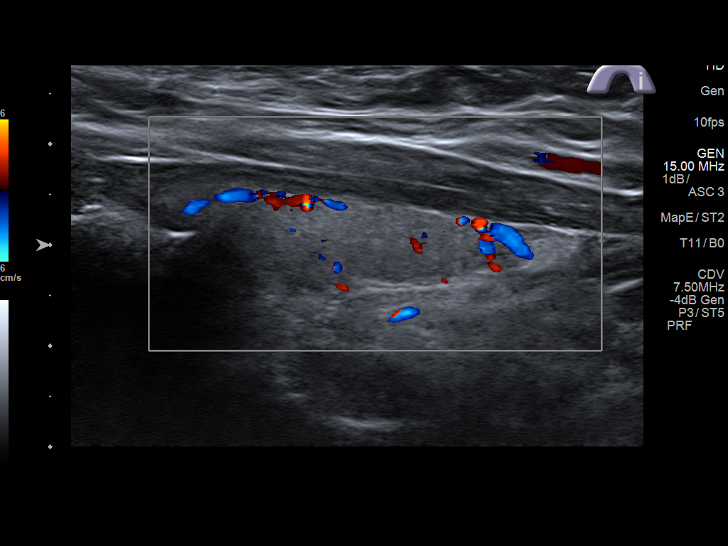
[im 26/35]
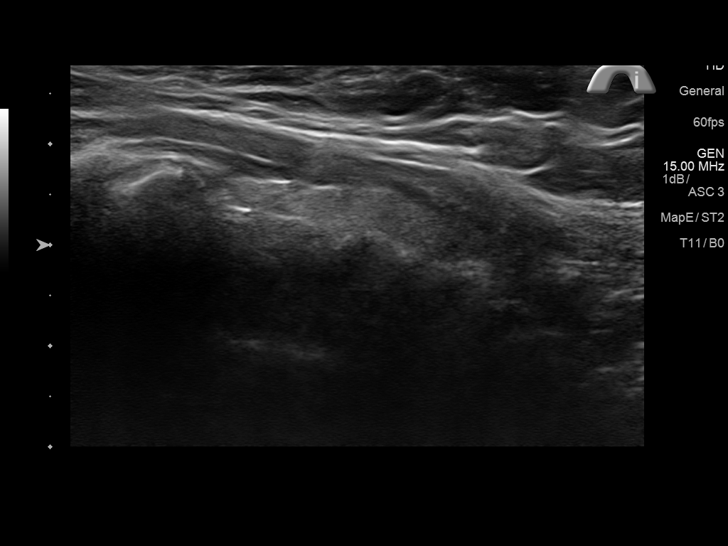
[im 29/35]
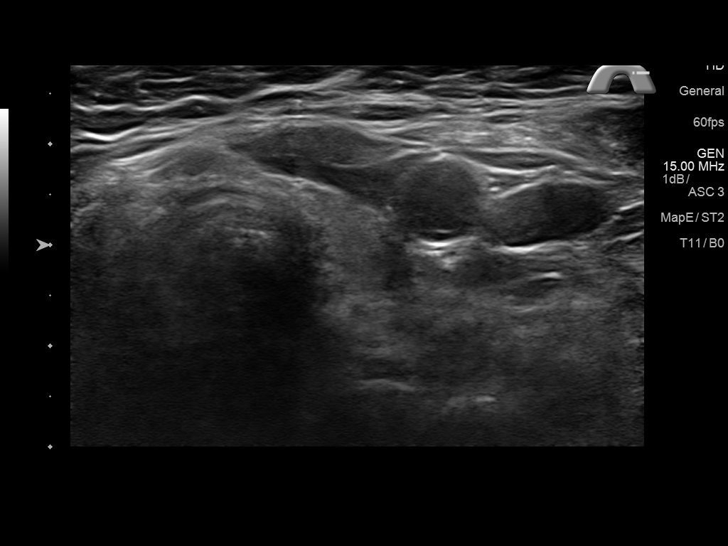
[im 32/35]
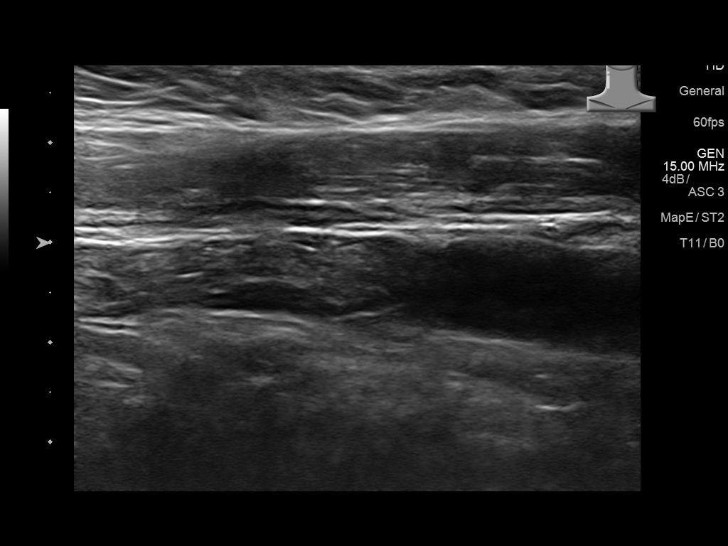
[im 35/35]
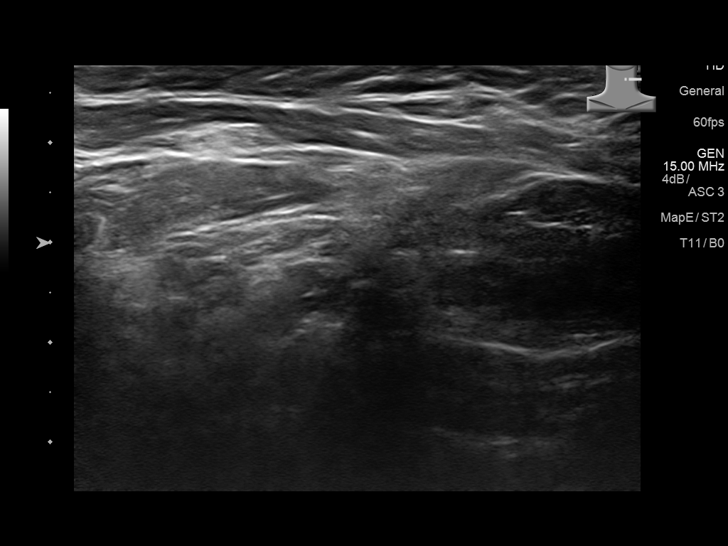

[13 of 25 positions shown; findings below may reference images not displayed]

FINDINGS: Parenchymal Echotexture: Normal

Estimated total number of nodules > 1 cm: <5

Number of spongiform nodules > 2 cm not described below (TR1): 0

Number of mixed cystic nodules > 1.5 cm not described below (TR2): 0

_________________________________________________________

Isthmus: 0.3 cm in thickness in the midline

Nodule # 1:

Prior biopsy: No

Location: Isthmus; left

Size: 1.1 x 0.8 x 1.0 cm

Composition: solid/almost completely solid (2)

Echogenicity: hypoechoic (2)

Shape: not taller-than-wide (0)

Margins: smooth (0)

Echogenic foci: none (0)

ACR TI-RADS total points: 4.

ACR TI-RADS risk category: TR4 (4-6 points).

Change in features: No

Change in ACR TI-RADS risk category: No

ACR TI-RADS recommendations:

*Given size (1 - 1.5 cm) and appearance, a follow-up ultrasound in 1
year is recommended based on TI-RADS criteria as clinically
indicated.

_________________________________________________________

Right lobe: 3.7 x 1.0 x 1.5 cm (stable size)

No discrete nodules are identified within the right lobe of the
thyroid.

_________________________________________________________

Left lobe: 3.0 x 0.9 x 0.9 cm (stable size)

No discrete nodules are identified within the left lobe of the
thyroid.
IMPRESSION: Stable left-sided isthmus nodule. Based on nodule characteristics
and size, ultrasound follow-up in 1 year is recommended, as above.

The above is in keeping with the ACR TI-RADS recommendations - [HOSPITAL] [4M];[DATE].

## 2015-01-19 ENCOUNTER — Other Ambulatory Visit: Payer: Self-pay | Admitting: Family Medicine

## 2015-01-19 NOTE — Telephone Encounter (Signed)
Please refill until her CPE

## 2015-01-19 NOTE — Telephone Encounter (Signed)
Electronic refill request, last refilled on 11/29/13 #90 with 3 additional refills, pt has CPE scheduled for 04/28/15, please advise

## 2015-01-20 NOTE — Telephone Encounter (Signed)
done

## 2015-04-10 ENCOUNTER — Other Ambulatory Visit: Payer: Self-pay | Admitting: Family Medicine

## 2015-04-10 DIAGNOSIS — Z1231 Encounter for screening mammogram for malignant neoplasm of breast: Secondary | ICD-10-CM

## 2015-04-14 ENCOUNTER — Other Ambulatory Visit: Payer: Self-pay | Admitting: Family Medicine

## 2015-04-14 ENCOUNTER — Ambulatory Visit
Admission: RE | Admit: 2015-04-14 | Discharge: 2015-04-14 | Disposition: A | Discharge status: Home / Self Care | Payer: Medicare Other | Source: Ambulatory Visit | Attending: Family Medicine | Admitting: Family Medicine

## 2015-04-14 DIAGNOSIS — Z1231 Encounter for screening mammogram for malignant neoplasm of breast: Secondary | ICD-10-CM

## 2015-04-14 LAB — HM MAMMOGRAPHY: HM MAMMO: NORMAL

## 2015-04-14 IMAGING — MG MM SCREENING BREAST TOMO BILATERAL
8 of 12 series · 8 of 28 positions shown · non-contrast
Comparison: Previous exam(s).

CLINICAL DATA: Screening.

EXAM:
DIGITAL SCREENING BILATERAL MAMMOGRAM WITH 3D TOMO WITH CAD

[L MLO synth-2D]
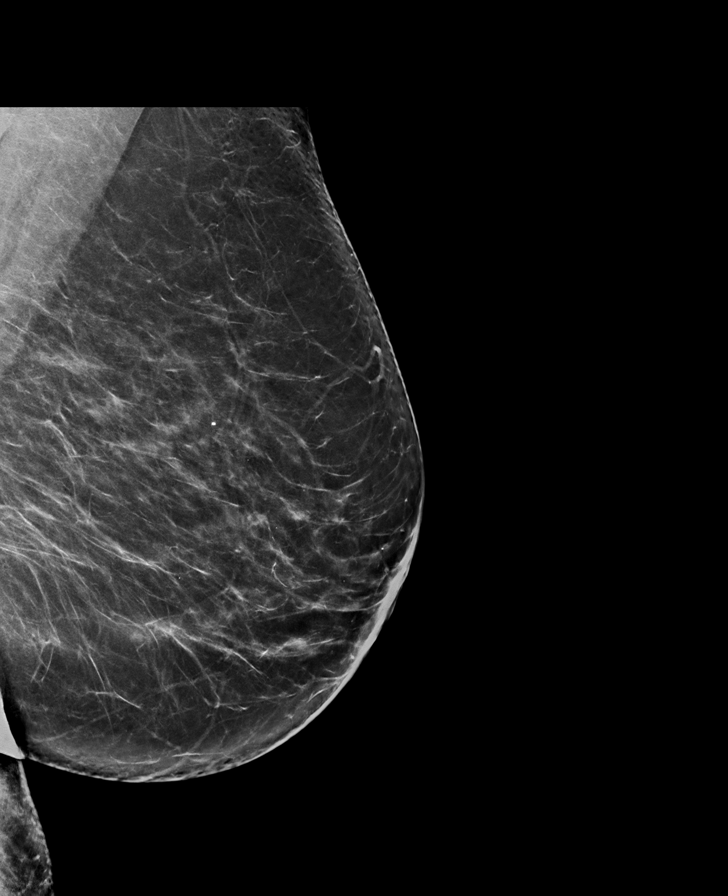

[R MLO]
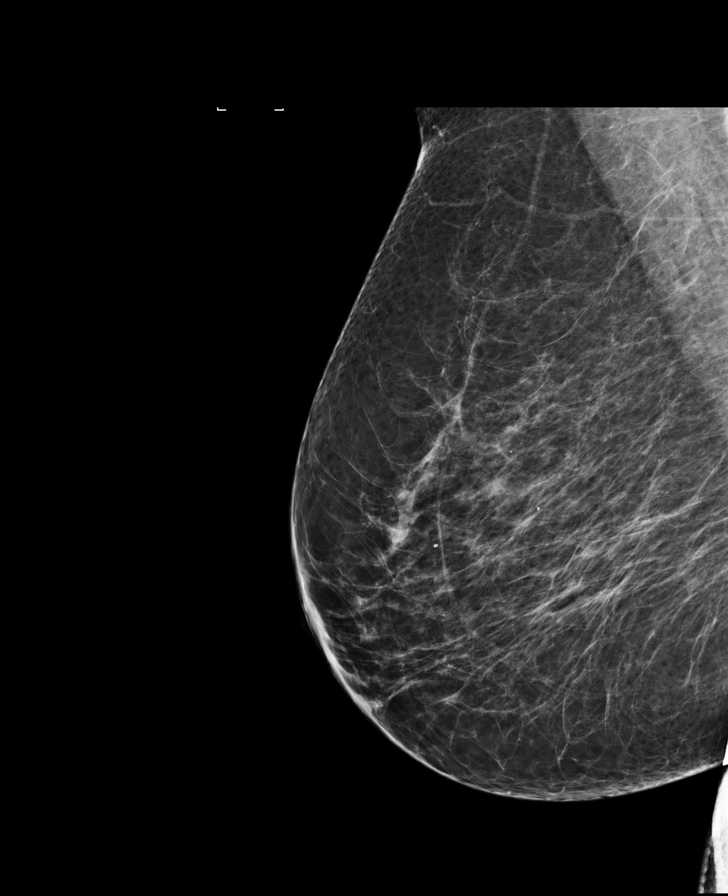

[L CC]
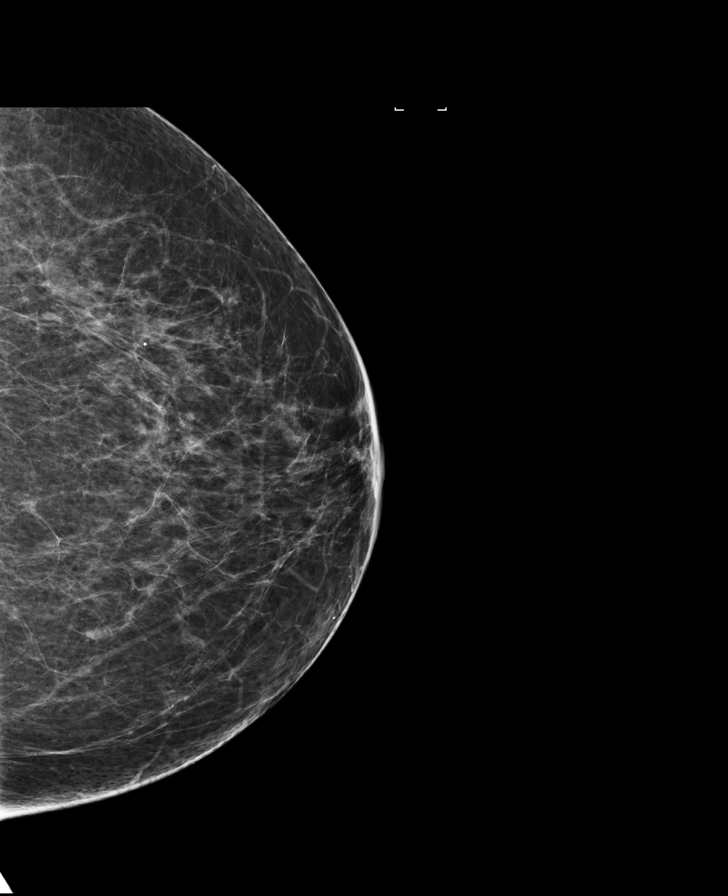

[R CC synth-2D]
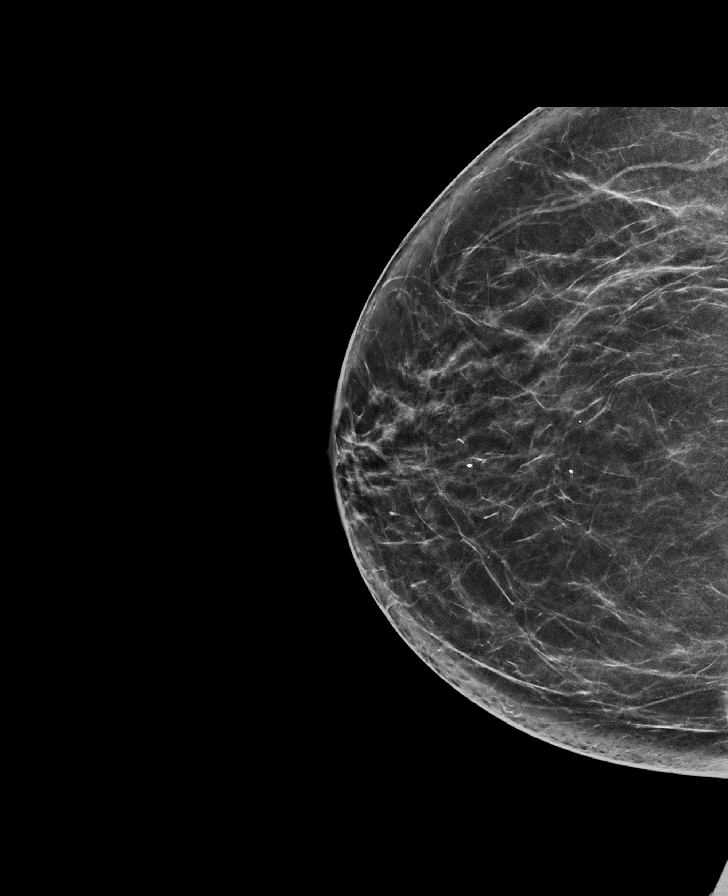

[L CC synth-2D]
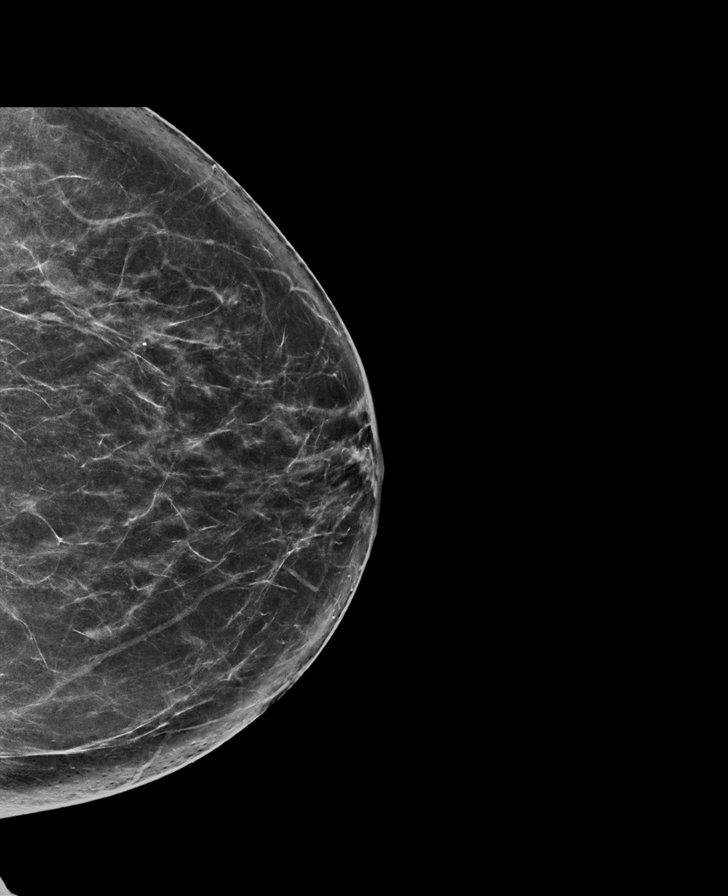

[R CC]
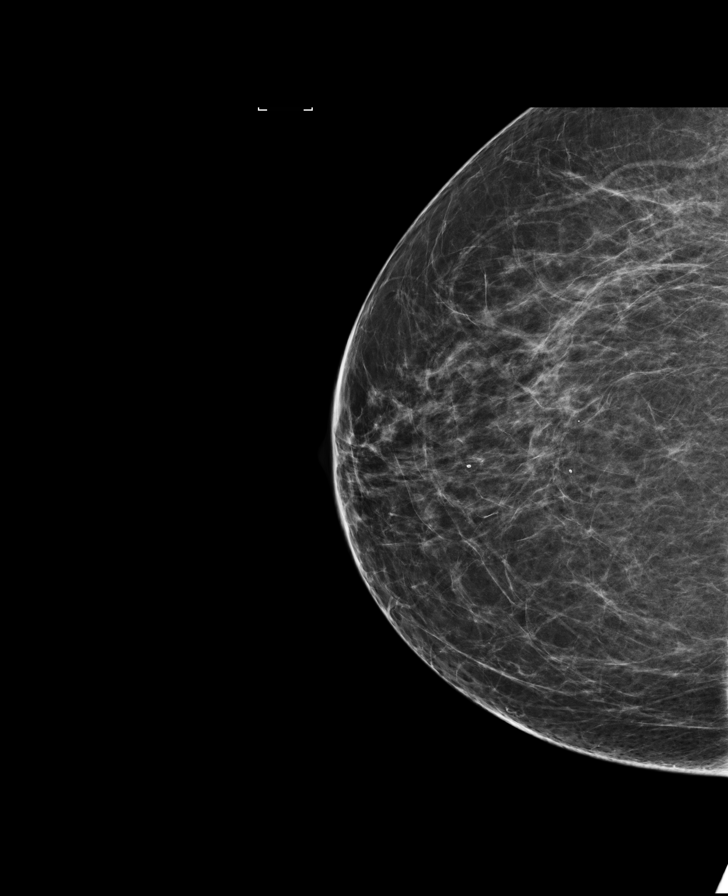

[R MLO synth-2D]
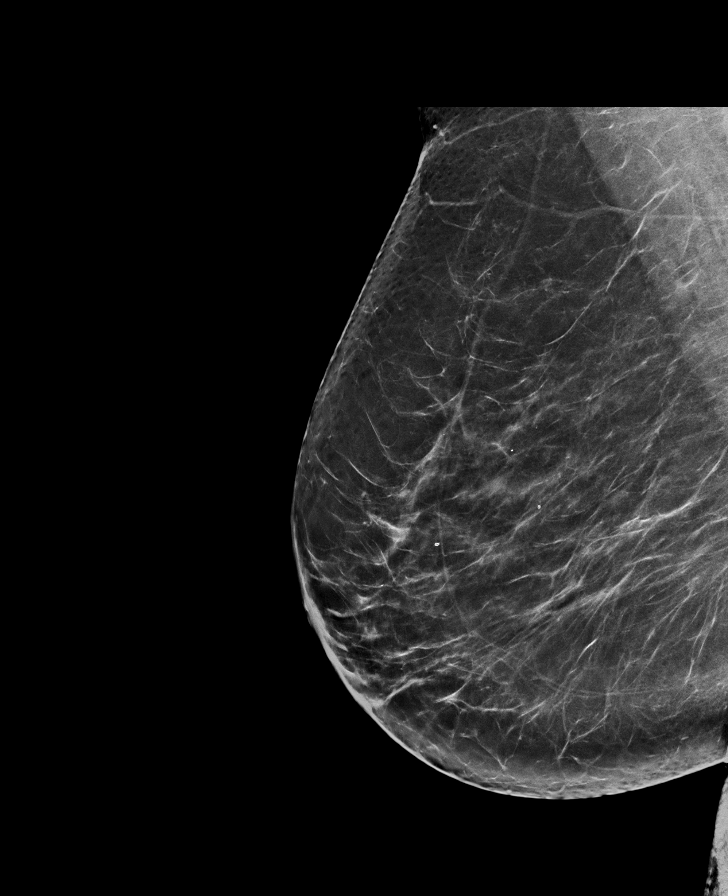

[L MLO]
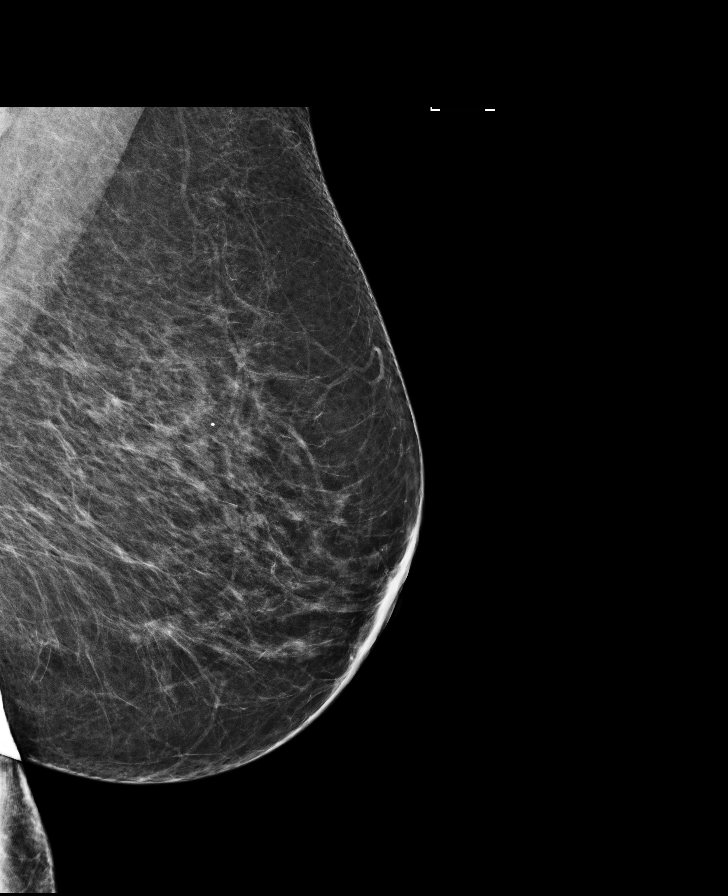

[8 of 28 positions shown; findings below may reference images not displayed]

ACR Breast Density Category c: The breast tissue is heterogeneously
dense, which may obscure small masses.
FINDINGS: There are no findings suspicious for malignancy. Images were
processed with CAD.
IMPRESSION: No mammographic evidence of malignancy. A result letter of this
screening mammogram will be mailed directly to the patient.

RECOMMENDATION:
Screening mammogram in one year. (Code:[SM])

BI-RADS CATEGORY  1: Negative.

## 2015-04-15 ENCOUNTER — Encounter: Payer: Self-pay | Admitting: *Deleted

## 2015-04-15 ENCOUNTER — Encounter: Payer: Self-pay | Admitting: Family Medicine

## 2015-04-16 ENCOUNTER — Telehealth: Payer: Self-pay | Admitting: Certified Nurse Midwife

## 2015-04-16 NOTE — Telephone Encounter (Signed)
Spoke with patient. Patient states that she feels she has been experiencing an ongoing yeast infection for a couple of months. Has used OTC Monistat twice with no relief. Last used Monistat 1 month ago. Denies any vaginal discharge. Is having vaginal itching. "I feel like I am almost raw." Is also having intermittent right sided tenderness. States she has had a right sided cyst in the past that has been monitored with ultrasounds. Denies any current discomfort. Advised she will need to be seen in the office for further evaluation. Patient is agreeable. Appointment scheduled for tomorrow 04/17/2015 at 8:30 am with Regina Eck CNM. Patient is agreeable to date and time.  Routing to provider for final review. Patient agreeable to disposition. Will close encounter.   Patient aware provider will review message and nurse will return call if any additional advice or change of disposition.

## 2015-04-16 NOTE — Telephone Encounter (Signed)
Left message to call Joeline Freer at [phone]. 

## 2015-04-16 NOTE — Telephone Encounter (Signed)
Patient called requesting an appointment and said, "I am swollen down there and very irritated and I have a lump that has been looked at before."

## 2015-04-17 ENCOUNTER — Encounter: Payer: Self-pay | Admitting: Certified Nurse Midwife

## 2015-04-17 ENCOUNTER — Ambulatory Visit (INDEPENDENT_AMBULATORY_CARE_PROVIDER_SITE_OTHER): Payer: Medicare Other | Admitting: Certified Nurse Midwife

## 2015-04-17 VITALS — BP 130/86 | HR 80 | Ht 63.5 in | Wt 224.0 lb

## 2015-04-17 DIAGNOSIS — N952 Postmenopausal atrophic vaginitis: Secondary | ICD-10-CM | POA: Diagnosis not present

## 2015-04-17 DIAGNOSIS — B9689 Other specified bacterial agents as the cause of diseases classified elsewhere: Secondary | ICD-10-CM

## 2015-04-17 DIAGNOSIS — N760 Acute vaginitis: Secondary | ICD-10-CM

## 2015-04-17 DIAGNOSIS — B3731 Acute candidiasis of vulva and vagina: Secondary | ICD-10-CM

## 2015-04-17 DIAGNOSIS — A499 Bacterial infection, unspecified: Secondary | ICD-10-CM | POA: Diagnosis not present

## 2015-04-17 DIAGNOSIS — B373 Candidiasis of vulva and vagina: Secondary | ICD-10-CM

## 2015-04-17 LAB — WET PREP BY MOLECULAR PROBE
CANDIDA SPECIES: NEGATIVE
Gardnerella vaginalis: NEGATIVE
TRICHOMONAS VAG: NEGATIVE

## 2015-04-17 MED ORDER — NYSTATIN-TRIAMCINOLONE 100000-0.1 UNIT/GM-% EX CREA: 1.0000 "application " | TOPICAL_CREAM | Freq: Two times a day (BID) | CUTANEOUS | Status: DC

## 2015-04-17 MED ORDER — HYLAFEM VA SUPP: 1.0000 | Freq: Every day | VAGINAL | Status: DC

## 2015-04-17 NOTE — Patient Instructions (Signed)
Candida Infection A Candida infection (also called yeast, fungus, and Monilia infection) is an overgrowth of yeast that can occur anywhere on the body. A yeast infection commonly occurs in warm, moist body areas. Usually, the infection remains localized but can spread to become a systemic infection. A yeast infection may be a sign of a more severe disease such as diabetes, leukemia, or AIDS. A yeast infection can occur in both men and women. In women, Candida vaginitis is a vaginal infection. It is one of the most common causes of vaginitis. Men usually do not have symptoms or know they have an infection until other problems develop. Men may find out they have a yeast infection because their sex partner has a yeast infection. Uncircumcised men are more likely to get a yeast infection than circumcised men. This is because the uncircumcised glans is not exposed to air and does not remain as dry as that of a circumcised glans. Older adults may develop yeast infections around dentures. CAUSES  Women  Antibiotics.  Steroid medication taken for a long time.  Being overweight (obese).  Diabetes.  Poor immune condition.  Certain serious medical conditions.  Immune suppressive medications for organ transplant patients.  Chemotherapy.  Pregnancy.  Menstruation.  Stress and fatigue.  Intravenous drug use.  Oral contraceptives.  Wearing tight-fitting clothes in the crotch area.  Catching it from a sex partner who has a yeast infection.  Spermicide.  Intravenous, urinary, or other catheters. Men  Catching it from a sex partner who has a yeast infection.  Having oral or anal sex with a person who has the infection.  Spermicide.  Diabetes.  Antibiotics.  Poor immune system.  Medications that suppress the immune system.  Intravenous drug use.  Intravenous, urinary, or other catheters. SYMPTOMS  Women  Thick, white vaginal discharge.  Vaginal itching.  Redness and  swelling in and around the vagina.  Irritation of the lips of the vagina and perineum.  Blisters on the vaginal lips and perineum.  Painful sexual intercourse.  Low blood sugar (hypoglycemia).  Painful urination.  Bladder infections.  Intestinal problems such as constipation, indigestion, bad breath, bloating, increase in gas, diarrhea, or loose stools. Men  Men may develop intestinal problems such as constipation, indigestion, bad breath, bloating, increase in gas, diarrhea, or loose stools.  Dry, cracked skin on the penis with itching or discomfort.  Jock itch.  Dry, flaky skin.  Athlete's foot.  Hypoglycemia. DIAGNOSIS  Women  A history and an exam are performed.  The discharge may be examined under a microscope.  A culture may be taken of the discharge. Men  A history and an exam are performed.  Any discharge from the penis or areas of cracked skin will be looked at under the microscope and cultured.  Stool samples may be cultured. TREATMENT  Women  Vaginal antifungal suppositories and creams.  Medicated creams to decrease irritation and itching on the outside of the vagina.  Warm compresses to the perineal area to decrease swelling and discomfort.  Oral antifungal medications.  Medicated vaginal suppositories or cream for repeated or recurrent infections.  Wash and dry the irritation areas before applying the cream.  Eating yogurt with Lactobacillus may help with prevention and treatment.  Sometimes painting the vagina with gentian violet solution may help if creams and suppositories do not work. Men  Antifungal creams and oral antifungal medications.  Sometimes treatment must continue for 30 days after the symptoms go away to prevent recurrence. HOME CARE INSTRUCTIONS    Women  Use cotton underwear and avoid tight-fitting clothing.  Avoid colored, scented toilet paper and deodorant tampons or pads.  Do not douche.  Keep your diabetes  under control.  Finish all the prescribed medications.  Keep your skin clean and dry.  Consume milk or yogurt with Lactobacillus-active culture regularly. If you get frequent yeast infections and think that is what the infection is, there are over-the-counter medications that you can get. If the infection does not show healing in 3 days, talk to your caregiver.  Tell your sex partner you have a yeast infection. Your partner may need treatment also, especially if your infection does not clear up or recurs. Men  Keep your skin clean and dry.  Keep your diabetes under control.  Finish all prescribed medications.  Tell your sex partner that you have a yeast infection so he or she can be treated if necessary. SEEK MEDICAL CARE IF:   Your symptoms do not clear up or worsen in one week after treatment.  You have an oral temperature above 102 F (38.9 C).  You have trouble swallowing or eating for a prolonged time.  You develop blisters on and around your vagina.  You develop vaginal bleeding and it is not your menstrual period.  You develop abdominal pain.  You develop intestinal problems as mentioned above.  You get weak or light-headed.  You have painful or increased urination.  You have pain during sexual intercourse. MAKE SURE YOU:   Understand these instructions.  Will watch your condition.  Will get help right away if you are not doing well or get worse. Document Released: 09/29/2004 Document Revised: 01/06/2014 Document Reviewed: 01/11/2010 [facility] Patient Information 2015 [place], [place]. This information is not intended to replace advice given to you by your health care provider. Make sure you discuss any questions you have with your health care provider. Atrophic Vaginitis Atrophic vaginitis is a problem of low levels of estrogen in women. This problem can happen at any age. It is most common in women who have gone through menopause ("the change").  HOW WILL I  KNOW IF I HAVE THIS PROBLEM? You may have:  Trouble with peeing (urinating), such as:  Going to the bathroom often.  A hard time holding your pee until you reach a bathroom.  Leaking pee.  Having pain when you pee.  Itching or a burning feeling.  Vaginal bleeding and spotting.  Pain during sex.  Dryness of the vagina.  A yellow, bad-smelling fluid (discharge) coming from the vagina. HOW WILL MY DOCTOR CHECK FOR THIS PROBLEM?  During your exam, your doctor will likely find the problem.  If there is a vaginal fluid, it may be checked for infection. HOW WILL THIS PROBLEM BE TREATED? Keep the vulvar skin as clean as possible. Moisturizers and lubricants can help with some of the symptoms. Estrogen replacement can help. There are 2 ways to take estrogen:  Systemic estrogen gets estrogen to your whole body. It takes many weeks or months before the symptoms get better.  You take an estrogen pill.  You use a skin patch. This is a patch that you put on your skin.  If you still have your uterus, your doctor may ask you to take a hormone. Talk to your doctor about the right medicine for you.  Estrogen cream.  This puts estrogen only at the part of your body where you apply it. The cream is put into the vagina or put on the vulvar skin. For  some women, estrogen cream works faster than pills or the patch. CAN ALL WOMEN WITH THIS PROBLEM USE ESTROGEN? No. Women with certain types of cancer, liver problems, or problems with blood clots should not take estrogen. Your doctor can help you decide the best treatment for your symptoms. Document Released: 02/08/2008 Document Revised: 08/27/2013 Document Reviewed: 02/08/2008 [facility] Patient Information 2015 [place], [place]. This information is not intended to replace advice given to you by your health care provider. Make sure you discuss any questions you have with your health care provider.  Obtain Aveeno Bath powdered [employer]

## 2015-04-17 NOTE — Progress Notes (Signed)
Reviewed personally.  M. Suzanne Timothy Trudell, MD.  

## 2015-04-17 NOTE — Progress Notes (Signed)
64 y.o.Married white female [id number] here with complaint of vaginal symptoms of itching, burning, and slight dryness.Marland Kitchen Describes discharge as  Very slight white, no odor.. Patient has been treating self with Monistat prn for itching, burning.Onset of symptoms over the past 3 months.. Denies new personal products. Patient has also been using Hydrocortisone and vaseline and  A&D ointment. Denies any vaginal bleeding. No STD concerns. Urinary symptoms none . Contraception is Menopausal.   O:Healthy female WDWN Affect: normal, orientation x 3  Exam: Abdomen: soft Lymph node: no enlargement or tenderness Pelvic exam: External genital: normal female, redness noted around vulva area and in groin area, slight scaling, slightly tender, no exudate wet prep taken BUS: negative Bladder and urethral meatus not tender Vagina: scant white slight odorous discharge noted. Ph:4.0   ,Wet prep taken, Affirm taken Cervix: normal, non tender, no CMT Uterus: normal, non tender, hard to palpate due to body habitus Adnexa:normal, non tender, no masses or fullness noted no large masses note, hard to palpate due to body habitus   Wet Prep results: positive for clue cells, few External positive for yeast   A:Normal limited pelvic exam due to body habitus No pap smear in 3 years, seeing PCP, declines pap today BV Yeast vulvitis   P:Discussed findings of yeast vulvitis and BV and etiology. Discussed Aveeno sitz bath for comfort. Avoid moist clothes or pads for extended period of time. If working out in gym clothes or swim suits for long periods of time change underwear or bottoms of swimsuit if possible. Olive Oil/Coconut Oil use for skin protection prior to activity can be used to external skin. Rx: Hylafem see order Rx Mycolog see order Discussed atrophic vaginitis and etiology and options of treatment. Patient prefers OTC. She will wait to treat until has used medications as directed and then start with Coconut  oil nightly in vulva and vaginal area. Will advise if problems. Plans to schedule aex for pap. Will treat per affirm if any changes  Rv prn

## 2015-04-18 ENCOUNTER — Telehealth: Payer: Self-pay | Admitting: Family Medicine

## 2015-04-18 DIAGNOSIS — R739 Hyperglycemia, unspecified: Secondary | ICD-10-CM

## 2015-04-18 DIAGNOSIS — E785 Hyperlipidemia, unspecified: Secondary | ICD-10-CM

## 2015-04-18 DIAGNOSIS — Z0000 Encounter for general adult medical examination without abnormal findings: Secondary | ICD-10-CM

## 2015-04-18 NOTE — Telephone Encounter (Signed)
-----   Message from Marchia Bond sent at 04/17/2015  3:42 PM EDT ----- Regarding: cpx labs 8/15, need orders please :-) Please order  future cpx labs for pt's upcoming lab appt. Thanks Aniceto Boss

## 2015-04-20 ENCOUNTER — Other Ambulatory Visit (INDEPENDENT_AMBULATORY_CARE_PROVIDER_SITE_OTHER): Payer: Medicare Other

## 2015-04-20 DIAGNOSIS — E785 Hyperlipidemia, unspecified: Secondary | ICD-10-CM | POA: Diagnosis not present

## 2015-04-20 DIAGNOSIS — R739 Hyperglycemia, unspecified: Secondary | ICD-10-CM

## 2015-04-20 DIAGNOSIS — Z0000 Encounter for general adult medical examination without abnormal findings: Secondary | ICD-10-CM

## 2015-04-20 LAB — LIPID PANEL
CHOL/HDL RATIO: 4
Cholesterol: 199 mg/dL (ref 0–200)
HDL: 48.3 mg/dL (ref >39.00)
LDL CALC: 130 mg/dL — AB (ref 0–99)
NONHDL: 150.75
Triglycerides: 105 mg/dL (ref 0.0–149.0)
VLDL: 21 mg/dL (ref 0.0–40.0)

## 2015-04-20 LAB — COMPREHENSIVE METABOLIC PANEL
ALT: 12 U/L (ref 0–35)
AST: 16 U/L (ref 0–37)
Albumin: 4.3 g/dL (ref 3.5–5.2)
Alkaline Phosphatase: 80 U/L (ref 39–117)
BILIRUBIN TOTAL: 0.5 mg/dL (ref 0.2–1.2)
BUN: 16 mg/dL (ref 6–23)
CALCIUM: 9.5 mg/dL (ref 8.4–10.5)
CO2: 29 meq/L (ref 19–32)
CREATININE: 0.78 mg/dL (ref 0.40–1.20)
Chloride: 102 mEq/L (ref 96–112)
GFR: 78.39 mL/min (ref >60.00)
GLUCOSE: 124 mg/dL — AB (ref 70–99)
Potassium: 4.5 mEq/L (ref 3.5–5.1)
SODIUM: 138 meq/L (ref 135–145)
Total Protein: 7.5 g/dL (ref 6.0–8.3)

## 2015-04-20 LAB — CBC WITH DIFFERENTIAL/PLATELET
BASOS ABS: 0 10*3/uL (ref 0.0–0.1)
Basophils Relative: 0.5 % (ref 0.0–3.0)
EOS ABS: 0.1 10*3/uL (ref 0.0–0.7)
Eosinophils Relative: 1.8 % (ref 0.0–5.0)
HEMATOCRIT: 41.8 % (ref 36.0–46.0)
Hemoglobin: 14 g/dL (ref 12.0–15.0)
LYMPHS PCT: 23.5 % (ref 12.0–46.0)
Lymphs Abs: 1.8 10*3/uL (ref 0.7–4.0)
MCHC: 33.5 g/dL (ref 30.0–36.0)
MCV: 94.1 fl (ref 78.0–100.0)
Monocytes Absolute: 0.6 10*3/uL (ref 0.1–1.0)
Monocytes Relative: 7.3 % (ref 3.0–12.0)
NEUTROS ABS: 5.1 10*3/uL (ref 1.4–7.7)
NEUTROS PCT: 66.9 % (ref 43.0–77.0)
PLATELETS: 271 10*3/uL (ref 150.0–400.0)
RBC: 4.45 Mil/uL (ref 3.87–5.11)
RDW: 12.7 % (ref 11.5–15.5)
WBC: 7.7 10*3/uL (ref 4.0–10.5)

## 2015-04-20 LAB — HEMOGLOBIN A1C: Hgb A1c MFr Bld: 6 % (ref 4.6–6.5)

## 2015-04-20 LAB — TSH: TSH: 1.45 u[IU]/mL (ref 0.35–4.50)

## 2015-04-28 ENCOUNTER — Ambulatory Visit (INDEPENDENT_AMBULATORY_CARE_PROVIDER_SITE_OTHER): Payer: Medicare Other | Admitting: Family Medicine

## 2015-04-28 ENCOUNTER — Encounter: Payer: Self-pay | Admitting: Family Medicine

## 2015-04-28 VITALS — BP 124/78 | HR 65 | Temp 98.4°F | Ht 64.0 in | Wt 225.2 lb

## 2015-04-28 DIAGNOSIS — Z0000 Encounter for general adult medical examination without abnormal findings: Secondary | ICD-10-CM

## 2015-04-28 DIAGNOSIS — M8580 Other specified disorders of bone density and structure, unspecified site: Secondary | ICD-10-CM | POA: Diagnosis not present

## 2015-04-28 DIAGNOSIS — R739 Hyperglycemia, unspecified: Secondary | ICD-10-CM | POA: Diagnosis not present

## 2015-04-28 DIAGNOSIS — Z23 Encounter for immunization: Secondary | ICD-10-CM | POA: Diagnosis not present

## 2015-04-28 DIAGNOSIS — E785 Hyperlipidemia, unspecified: Secondary | ICD-10-CM | POA: Diagnosis not present

## 2015-04-28 DIAGNOSIS — E669 Obesity, unspecified: Secondary | ICD-10-CM

## 2015-04-28 MED ORDER — FLUOXETINE HCL 40 MG PO CAPS: 40.0000 mg | ORAL_CAPSULE | Freq: Every day | ORAL | Status: DC

## 2015-04-28 NOTE — Assessment & Plan Note (Signed)
Disc goals for lipids and reasons to control them Rev labs with pt Rev low sat fat diet in detail LDL is up due to poor eating - counseled on this Handout given  Will continue to follow

## 2015-04-28 NOTE — Assessment & Plan Note (Signed)
Reviewed health habits including diet and exercise and skin cancer prevention Reviewed appropriate screening tests for age  Also reviewed health mt list, fam hx and immunization status , as well as social and family history   See HPI Labs reviewed  Enc to get a flu shot in the fall  prevnar vaccine today  Enc to check on coverage of zoster vaccine  Enc wt loss as well

## 2015-04-28 NOTE — Assessment & Plan Note (Signed)
Discussed how this problem influences overall health and the risks it imposes  Reviewed plan for weight loss with lower calorie diet (via better food choices and also portion control or program like weight watchers) and exercise building up to or more than 30 minutes 5 days per week including some aerobic activity   Pt is not very motivated currently

## 2015-04-28 NOTE — Patient Instructions (Signed)
prevnar vaccine today  Get a flu shot in the fall  If you are interested in a shingles/zoster vaccine - call your insurance to check on coverage,( you should not get it within 1 month of other vaccines) , then call us for a prescription  for it to take to a pharmacy that gives the shot , or make a nurse visit to get it here depending on your coverage Work on weight loss and low cholesterol and sugar diet   For cholesterol (Avoid red meat/ fried foods/ egg yolks/ fatty breakfast meats/ butter, cheese and high fat dairy/ and shellfish )

## 2015-04-28 NOTE — Progress Notes (Signed)
Pre visit review using our clinic review tool, if applicable. No additional management support is needed unless otherwise documented below in the visit note. 

## 2015-04-28 NOTE — Progress Notes (Signed)
Subjective:    Patient ID: Deborah Carey, female    DOB: 1951-05-04, 64 y.o.   MRN: [id number]  HPI Here for annual medicare wellness visit as well as chronic/acute medical problems as well as annual preventative examination  I have personally reviewed the Medicare Annual Wellness questionnaire and have noted 1. The patient's medical and social history 2. Their use of alcohol, tobacco or illicit drugs 3. Their current medications and supplements 4. The patient's functional ability including ADL's, fall risks, home safety risks and hearing or visual             impairment. 5. Diet and physical activities 6. Evidence for depression or mood disorders  The patients weight, height, BMI have been recorded in the chart and visual acuity is per eye clinic.  I have made referrals, counseling and provided education to the patient based review of the above and I have provided the pt with a written personalized care plan for preventive services. Reviewed and updated provider list, see scanned forms.  Feels ok overall   See scanned forms.  Routine anticipatory guidance given to patient.  See health maintenance. Colon cancer screening 2/10 - 10 year recall  Breast cancer screening mammogram 8/16 nl  Self breast exam- no lumps / will see gyn next month (dense breasts)  Flu vaccine -has had before , will get in the fall  Tetanus vaccine 3/14 Pneumovax 3/15 Pneumovax 23, will get prevnar today  Zoster vaccine- would consider if it was covered 4/15 - dexa - very mild osteopenia , is taking calcium and D , difficult for her to exercise  Had a fx leg 5/15  - a misstep and had a very hard fall (not a fragility fracture) Finally able to do more now  Advance directive -has a living will and POA already  Cognitive function addressed- see scanned forms- and if abnormal then additional documentation follows. -no concerns for her age (occ misplaces things)   PMH and SH reviewed  Meds, vitals, and  allergies reviewed.   ROS: See HPI.  Otherwise negative.    Wt is up 1 lb  Obese with bmi of 38 Knows she needs to loose weight - not motivated yet (difficult after she broke her leg)    Hyperglycemia Lab Results  Component Value Date   HGBA1C 6.0 04/20/2015   This is down from 6.2 Has watched sugar in diet a bit    Hyperlipidemia Lab Results  Component Value Date   CHOL 199 04/20/2015   CHOL 206* 10/30/2013   CHOL 180 10/31/2012   Lab Results  Component Value Date   HDL 48.30 04/20/2015   HDL 44.90 10/30/2013   HDL 46.80 10/31/2012   Lab Results  Component Value Date   LDLCALC 130* 04/20/2015   LDLCALC 116* 10/31/2012   LDLCALC 105* 09/20/2011   Lab Results  Component Value Date   TRIG 105.0 04/20/2015   TRIG 140.0 10/30/2013   TRIG 84.0 10/31/2012   Lab Results  Component Value Date   CHOLHDL 4 04/20/2015   CHOLHDL 5 10/30/2013   CHOLHDL 4 10/31/2012   Lab Results  Component Value Date   LDLDIRECT 141.8 10/30/2013   she does eat fatty food - red meat/ fried things  Thinks she can work on that   Results for orders placed or performed in visit on 04/20/15  CBC with Differential/Platelet  Result Value Ref Range   WBC 7.7 4.0 - 10.5 K/uL   RBC 4.45 3.87 -  5.11 Mil/uL   Hemoglobin 14.0 12.0 - 15.0 g/dL   HCT 41.8 36.0 - 46.0 %   MCV 94.1 78.0 - 100.0 fl   MCHC 33.5 30.0 - 36.0 g/dL   RDW 12.7 11.5 - 15.5 %   Platelets 271.0 150.0 - 400.0 K/uL   Neutrophils Relative % 66.9 43.0 - 77.0 %   Lymphocytes Relative 23.5 12.0 - 46.0 %   Monocytes Relative 7.3 3.0 - 12.0 %   Eosinophils Relative 1.8 0.0 - 5.0 %   Basophils Relative 0.5 0.0 - 3.0 %   Neutro Abs 5.1 1.4 - 7.7 K/uL   Lymphs Abs 1.8 0.7 - 4.0 K/uL   Monocytes Absolute 0.6 0.1 - 1.0 K/uL   Eosinophils Absolute 0.1 0.0 - 0.7 K/uL   Basophils Absolute 0.0 0.0 - 0.1 K/uL  Comprehensive metabolic panel  Result Value Ref Range   Sodium 138 135 - 145 mEq/L   Potassium 4.5 3.5 - 5.1 mEq/L    Chloride 102 96 - 112 mEq/L   CO2 29 19 - 32 mEq/L   Glucose, Bld 124 (H) 70 - 99 mg/dL   BUN 16 6 - 23 mg/dL   Creatinine, Ser 0.78 0.40 - 1.20 mg/dL   Total Bilirubin 0.5 0.2 - 1.2 mg/dL   Alkaline Phosphatase 80 39 - 117 U/L   AST 16 0 - 37 U/L   ALT 12 0 - 35 U/L   Total Protein 7.5 6.0 - 8.3 g/dL   Albumin 4.3 3.5 - 5.2 g/dL   Calcium 9.5 8.4 - 10.5 mg/dL   GFR 78.39 >60.00 mL/min  Hemoglobin A1c  Result Value Ref Range   Hgb A1c MFr Bld 6.0 4.6 - 6.5 %  Lipid panel  Result Value Ref Range   Cholesterol 199 0 - 200 mg/dL   Triglycerides 105.0 0.0 - 149.0 mg/dL   HDL 48.30 >39.00 mg/dL   VLDL 21.0 0.0 - 40.0 mg/dL   LDL Cholesterol 130 (H) 0 - 99 mg/dL   Total CHOL/HDL Ratio 4    NonHDL 150.75   TSH  Result Value Ref Range   TSH 1.45 0.35 - 4.50 uIU/mL     Patient Active Problem List   Diagnosis Date Noted  . Left fibular fracture 01/21/2014  . Osteopenia 01/07/2014  . Family history of osteoporosis 11/29/2013  . Estrogen deficiency 11/29/2013  . Hyperlipidemia 11/29/2013  . Encounter for Medicare annual wellness exam 10/29/2013  . Irregular heartbeat 10/07/2013  . Left thyroid nodule 09/26/2013  . Enlarged thyroid 09/16/2013  . Primary osteoarthritis of both knees 11/05/2012  . Other screening mammogram 09/27/2011  . Routine general medical examination at a health care facility 09/19/2011  . OBESITY 06/06/2008  . Hyperglycemia 06/06/2008  . ANXIETY 04/17/2007  . ALLERGIC RHINITIS 04/17/2007   Past Medical History  Diagnosis Date  . Allergy     allergic rhinitis  . Anxiety   . Post-menopausal bleeding     neg endo bx.  . Infertility, female   . Osteoarthritis of both knees    Past Surgical History  Procedure Laterality Date  . Foot surgery  1998    rt heel spur removal  . Refractive surgery  08/1999  . Facial cosmetic surgery  2006    face lift  . Endometrial biopsy      neg for CA cells  . Cesarean section      times 2  . Cataract surgery   2011   Social History  Substance Use Topics  .  Smoking status: Never Smoker   . Smokeless tobacco: Never Used  . Alcohol Use: 0.0 oz/week    0 Standard drinks or equivalent per week     Comment: occasional alcohol   Family History  Problem Relation Age of Onset  . Stroke Mother   . Hypertension Mother   . Osteoporosis Mother   . Alzheimer's disease Father   . Hypertension Father   . Osteoporosis Father   . Depression Sister   . Hypertension Sister   . Osteoporosis Sister   . Thyroid disease Sister   . COPD Brother   . Parkinson's disease Sister   . Osteoporosis Sister    No Known Allergies Current Outpatient Prescriptions on File Prior to Visit  Medication Sig Dispense Refill  . albuterol (PROVENTIL HFA;VENTOLIN HFA) 108 (90 BASE) MCG/ACT inhaler Inhale 2 puffs into the lungs every 4 (four) hours as needed. 1 Inhaler 5  . CALCIUM PO Take by mouth as needed.     . cetirizine (ZYRTEC) 10 MG chewable tablet Chew 10 mg by mouth as needed for allergies.    Marland Kitchen FLUoxetine (PROZAC) 40 MG capsule TAKE ONE CAPSULE BY MOUTH EVERY DAY 90 capsule 0  . fluticasone (FLONASE) 50 MCG/ACT nasal spray Place 2 sprays into the nose as needed.    . Homeopathic Products (HYLAFEM) SUPP Place 1 suppository vaginally at bedtime. 3 suppository 1  . Multiple Vitamins-Minerals (MULTIVITAMIN PO) Take by mouth as needed.     . naproxen sodium (ANAPROX) 220 MG tablet Take 220 mg by mouth as needed.    . nystatin-triamcinolone (MYCOLOG II) cream Apply 1 application topically 2 (two) times daily. Apply to affected area BID for up to 7 days. 60 g 0  . Omega-3 Fatty Acids (FISH OIL PO) Take by mouth as needed.     . vitamin C (ASCORBIC ACID) 500 MG tablet Take 500 mg by mouth daily.     No current facility-administered medications on file prior to visit.    Review of Systems Review of Systems  Constitutional: Negative for fever, appetite change, fatigue and unexpected weight change.  Eyes: Negative for pain  and visual disturbance.  Respiratory: Negative for cough and shortness of breath.   Cardiovascular: Negative for cp or palpitations    Gastrointestinal: Negative for nausea, diarrhea and constipation.  Genitourinary: Negative for urgency and frequency.  Skin: Negative for pallor or rash   MSK pos for some left over ankle pain from fibular fracture that is gradually improving  Neurological: Negative for weakness, light-headedness, numbness and headaches.  Hematological: Negative for adenopathy. Does not bruise/bleed easily.  Psychiatric/Behavioral: Negative for dysphoric mood. The patient is not nervous/anxious.         Objective:   Physical Exam  Constitutional: She appears well-developed and well-nourished. No distress.  obese and well appearing   HENT:  Head: Normocephalic and atraumatic.  Right Ear: External ear normal.  Left Ear: External ear normal.  Nose: Nose normal.  Mouth/Throat: Oropharynx is clear and moist.  Eyes: Conjunctivae and EOM are normal. Pupils are equal, round, and reactive to light. Right eye exhibits no discharge. Left eye exhibits no discharge. No scleral icterus.  Neck: Normal range of motion. Neck supple. No JVD present. Carotid bruit is not present. No thyromegaly present.  Cardiovascular: Normal rate, regular rhythm, normal heart sounds and intact distal pulses.  Exam reveals no gallop.   Pulmonary/Chest: Effort normal and breath sounds normal. No respiratory distress. She has no wheezes. She has no rales.  Abdominal: Soft. Bowel sounds are normal. She exhibits no distension and no mass. There is no tenderness.  Genitourinary:  Pt will have breast exam with gyn next month  Musculoskeletal: She exhibits no edema or tenderness.  Lymphadenopathy:    She has no cervical adenopathy.  Neurological: She is alert. She has normal reflexes. No cranial nerve deficit. She exhibits normal muscle tone. Coordination normal.  Skin: Skin is warm and dry. No rash noted.  No erythema. No pallor.  Few SKs  Psychiatric: She has a normal mood and affect.          Assessment & Plan:   Problem List Items Addressed This Visit    Encounter for Medicare annual wellness exam    Reviewed health habits including diet and exercise and skin cancer prevention Reviewed appropriate screening tests for age  Also reviewed health mt list, fam hx and immunization status , as well as social and family history   See HPI Labs reviewed  Enc to get a flu shot in the fall  prevnar vaccine today  Enc to check on coverage of zoster vaccine  Enc wt loss as well       Hyperglycemia    Lab Results  Component Value Date   HGBA1C 6.0 04/20/2015   Improved-in good control Enc low glycemic diet and wt loss to prevent DM2      Hyperlipidemia    Disc goals for lipids and reasons to control them Rev labs with pt Rev low sat fat diet in detail LDL is up due to poor eating - counseled on this Handout given  Will continue to follow        Obesity    Discussed how this problem influences overall health and the risks it imposes  Reviewed plan for weight loss with lower calorie diet (via better food choices and also portion control or program like weight watchers) and exercise building up to or more than 30 minutes 5 days per week including some aerobic activity   Pt is not very motivated currently        Osteopenia - Primary    Very mild on dexa 4/15 Hx of one non fragility fx and has a fam hx of OP  Will watch every 2 years  Disc need for calcium/ vitamin D/ wt bearing exercise and bone density test every 2 y to monitor Disc safety/ fracture risk in detail        Routine general medical examination at a health care facility    Reviewed health habits including diet and exercise and skin cancer prevention Reviewed appropriate screening tests for age  Also reviewed health mt list, fam hx and immunization status , as well as social and family history   See HPI Labs  reviewed  Enc to get a flu shot in the fall  prevnar vaccine today  Enc to check on coverage of zoster vaccine  Enc wt loss as well

## 2015-04-28 NOTE — Assessment & Plan Note (Signed)
Very mild on dexa 4/15 Hx of one non fragility fx and has a fam hx of OP  Will watch every 2 years  Disc need for calcium/ vitamin D/ wt bearing exercise and bone density test every 2 y to monitor Disc safety/ fracture risk in detail

## 2015-04-28 NOTE — Assessment & Plan Note (Signed)
Lab Results  Component Value Date   HGBA1C 6.0 04/20/2015   Improved-in good control Enc low glycemic diet and wt loss to prevent DM2

## 2015-06-02 ENCOUNTER — Encounter: Payer: Self-pay | Admitting: Certified Nurse Midwife

## 2015-06-02 ENCOUNTER — Ambulatory Visit (INDEPENDENT_AMBULATORY_CARE_PROVIDER_SITE_OTHER): Payer: Medicare Other | Admitting: Certified Nurse Midwife

## 2015-06-02 VITALS — BP 122/70 | HR 72 | Resp 20 | Ht 64.25 in | Wt 225.0 lb

## 2015-06-02 DIAGNOSIS — Z124 Encounter for screening for malignant neoplasm of cervix: Secondary | ICD-10-CM

## 2015-06-02 DIAGNOSIS — Z0000 Encounter for general adult medical examination without abnormal findings: Secondary | ICD-10-CM

## 2015-06-02 DIAGNOSIS — Z01419 Encounter for gynecological examination (general) (routine) without abnormal findings: Secondary | ICD-10-CM

## 2015-06-02 DIAGNOSIS — N952 Postmenopausal atrophic vaginitis: Secondary | ICD-10-CM | POA: Diagnosis not present

## 2015-06-02 LAB — POCT URINALYSIS DIPSTICK
BILIRUBIN UA: NEGATIVE
Blood, UA: NEGATIVE
GLUCOSE UA: NEGATIVE
Ketones, UA: NEGATIVE
LEUKOCYTES UA: NEGATIVE
NITRITE UA: NEGATIVE
Protein, UA: NEGATIVE
Urobilinogen, UA: NEGATIVE
pH, UA: 5

## 2015-06-02 NOTE — Progress Notes (Signed)
64 y.o. G52P2002 Married  Caucasian Fe here for annual exam.  Menopausal no vaginal bleeding or vaginal dryness. Has not had any more issues with itching or vaginal dryness. Using coconut oil for vaginal dryness, "Working well". Sees PCP for aex, labs and medication management for anxiety, allergies and cholesterol. Diagnosed with thyroid nodule on left,under follow up with PCP. No treatment at this time. No other health concerns today. Taking vacation with the girls to [place]!  Patient's last menstrual period was 09/05/2008.          Sexually active: Yes.    The current method of family planning is post menopausal status.    Exercising: No.  exercise Smoker:  no  Health Maintenance: Pap: 03-19-12 neg HPV HR neg MMG: 04-14-15 category c density,birads 1:neg 3 D Colonoscopy:  2011 negative 10 year plan BMD:   2015 mild osteopenia TDaP:  2014 Labs: poct urine-neg Self breast exam: done monthly   reports that she has never smoked. She has never used smokeless tobacco. She reports that she does not drink alcohol or use illicit drugs.  Past Medical History  Diagnosis Date  . Allergy     allergic rhinitis  . Anxiety   . Post-menopausal bleeding     neg endo bx.  . Infertility, female   . Osteoarthritis of both knees     Past Surgical History  Procedure Laterality Date  . Foot surgery  1998    rt heel spur removal  . Refractive surgery  08/1999  . Facial cosmetic surgery  2006    face lift  . Endometrial biopsy      neg for CA cells  . Cesarean section      times 2  . Cataract surgery  2011    Current Outpatient Prescriptions  Medication Sig Dispense Refill  . albuterol (PROVENTIL HFA;VENTOLIN HFA) 108 (90 BASE) MCG/ACT inhaler Inhale 2 puffs into the lungs every 4 (four) hours as needed. 1 Inhaler 5  . CALCIUM PO Take by mouth as needed.     . cetirizine (ZYRTEC) 10 MG tablet Take 10 mg by mouth daily.    . Coenzyme Q10 (COQ10 PO) Take by mouth daily.    Marland Kitchen FLUoxetine (PROZAC)  40 MG capsule Take 1 capsule (40 mg total) by mouth daily. 90 capsule 3  . fluticasone (FLONASE) 50 MCG/ACT nasal spray Place 2 sprays into the nose as needed.    . Multiple Vitamins-Minerals (MULTIVITAMIN PO) Take by mouth as needed.     . naproxen sodium (ANAPROX) 220 MG tablet Take 220 mg by mouth as needed.    . Omega-3 Fatty Acids (FISH OIL PO) Take by mouth as needed.     . vitamin C (ASCORBIC ACID) 500 MG tablet Take 500 mg by mouth daily.     No current facility-administered medications for this visit.    Family History  Problem Relation Age of Onset  . Stroke Mother   . Hypertension Mother   . Osteoporosis Mother   . Alzheimer's disease Father   . Hypertension Father   . Osteoporosis Father   . Depression Sister   . Hypertension Sister   . Osteoporosis Sister   . Thyroid disease Sister   . COPD Brother   . Parkinson's disease Sister   . Osteoporosis Sister     ROS:  Pertinent items are noted in HPI.  Otherwise, a comprehensive ROS was negative.  Exam:   BP 122/70 mmHg  Pulse 72  Resp 20  Ht  5' 4.25" (1.632 m)  Wt 225 lb (102.059 kg)  BMI 38.32 kg/m2  LMP 09/05/2008 Height: 5' 4.25" (163.2 cm) Ht Readings from Last 3 Encounters:  06/02/15 5' 4.25" (1.632 m)  04/28/15 5\' 4"  (1.626 m)  04/17/15 5' 3.5" (1.613 m)    General appearance: alert, cooperative and appears stated age Head: Normocephalic, without obvious abnormality, atraumatic Neck: no adenopathy, supple, symmetrical, trachea midline and thyroid normal to inspection and palpation and nodule noted on left Lungs: clear to auscultation bilaterally Breasts: normal appearance, no masses or tenderness, No nipple retraction or dimpling, No nipple discharge or bleeding, No axillary or supraclavicular adenopathy Heart: regular rate and rhythm Abdomen: soft, non-tender; no masses,  no organomegaly Extremities: extremities normal, atraumatic, no cyanosis or edema Skin: Skin color, texture, turgor normal. No  rashes or lesions Lymph nodes: Cervical, supraclavicular, and axillary nodes normal. No abnormal inguinal nodes palpated Neurologic: Grossly normal   Pelvic: External genitalia:  no lesions              Urethra:  normal appearing urethra with no masses, tenderness or lesions              Bartholin's and Skene's: normal                 Vagina: normal appearing vagina with normal color and discharge, no lesions              Cervix: normal,non tender,no lesions              Pap taken: Yes.   Bimanual Exam:  Uterus:  normal and mid position, limited by body habitus              Adnexa: no mass, fullness, tenderness , no large masses, adnexal not palpated due to body habitus               Rectovaginal: Confirms               Anus:  normal sphincter tone, no lesions  Chaperone present: yes  A:  Well Woman with normal exam  Menopausal no HRT  Atrophic Vaginitis, coconut oil working well  Anxiety/thyroid nodule under PCP management  P:   Reviewed health and wellness pertinent to exam  Aware of need to advise if vaginal bleeding  Continue coconut oil as discussed, advise if any problems  Continue MD follow up as indicated  Pap smear as above taken   counseled on breast self exam, mammography screening, adequate intake of calcium and vitamin D, diet and exercise  return annually or prn  An After Visit Summary was printed and given to the patient.

## 2015-06-02 NOTE — Patient Instructions (Signed)

## 2015-06-03 LAB — IPS PAP SMEAR ONLY

## 2015-06-04 NOTE — Progress Notes (Signed)
Encounter reviewed Jill Jertson, MD   

## 2015-06-29 ENCOUNTER — Ambulatory Visit (INDEPENDENT_AMBULATORY_CARE_PROVIDER_SITE_OTHER): Payer: Medicare Other | Admitting: Family Medicine

## 2015-06-29 ENCOUNTER — Encounter: Payer: Self-pay | Admitting: Family Medicine

## 2015-06-29 VITALS — BP 128/72 | HR 69 | Temp 98.0°F | Ht 64.25 in | Wt 227.5 lb

## 2015-06-29 DIAGNOSIS — R05 Cough: Secondary | ICD-10-CM | POA: Diagnosis not present

## 2015-06-29 DIAGNOSIS — R058 Other specified cough: Secondary | ICD-10-CM | POA: Insufficient documentation

## 2015-06-29 MED ORDER — GUAIFENESIN-CODEINE 100-10 MG/5ML PO SYRP: 5.0000 mL | ORAL_SOLUTION | Freq: Every evening | ORAL | Status: DC | PRN

## 2015-06-29 MED ORDER — BENZONATATE 200 MG PO CAPS: 200.0000 mg | ORAL_CAPSULE | Freq: Three times a day (TID) | ORAL | Status: DC | PRN

## 2015-06-29 NOTE — Progress Notes (Signed)
Pre visit review using our clinic review tool, if applicable. No additional management support is needed unless otherwise documented below in the visit note. 

## 2015-06-29 NOTE — Patient Instructions (Signed)
I think you are stuck in a cough cycle after a cold  Continue allergy med for post nasal drip  Try mucinex DM during the day tesaalon pills three times daily (swallow whole) Robitussin AC (codeine) at night   Goal is to stop cough cycle  Update if not starting to improve in a week or if worsening  -or if fever or wheezing or other symptoms

## 2015-06-29 NOTE — Assessment & Plan Note (Signed)
Ongoing  Trial of tessalon tid and mucinex dm during the day Px for robitussin AC at night -with warning of sedation  Fluids/rest  Update if not starting to improve in a week or if worsening

## 2015-06-29 NOTE — Progress Notes (Signed)
Subjective:    Patient ID: Deborah Carey, female    DOB: 1951-02-18, 64 y.o.   MRN: [id number]  HPI Here with a cough  3 weeks ago -had a ST and then a virus for the week (like a bad head cold and headache) Then coughing started  Cannot get rid of it  Phlegm is white in color   Sometimes she gets gagged by a cough   Takes allergy med Uses saline spray   No fever when she had the original cold   Tired too -taking a nap in the afternoon   Not a lot of cough at night -little bit   Not wheezing/has not needed an inhaler   Has taken robitussin over the counter or mucinex (? If DM)    Also mentions at the end of the visit her shoulder and back of neck sore on R side      Patient Active Problem List   Diagnosis Date Noted  . Left fibular fracture 01/21/2014  . Osteopenia 01/07/2014  . Family history of osteoporosis 11/29/2013  . Estrogen deficiency 11/29/2013  . Hyperlipidemia 11/29/2013  . Encounter for Medicare annual wellness exam 10/29/2013  . Irregular heartbeat 10/07/2013  . Left thyroid nodule 09/26/2013  . Enlarged thyroid 09/16/2013  . Primary osteoarthritis of both knees 11/05/2012  . Other screening mammogram 09/27/2011  . Routine general medical examination at a health care facility 09/19/2011  . Obesity 06/06/2008  . Hyperglycemia 06/06/2008  . ANXIETY 04/17/2007  . ALLERGIC RHINITIS 04/17/2007   Past Medical History  Diagnosis Date  . Allergy     allergic rhinitis  . Anxiety   . Post-menopausal bleeding     neg endo bx.  . Infertility, female   . Osteoarthritis of both knees    Past Surgical History  Procedure Laterality Date  . Foot surgery  1998    rt heel spur removal  . Refractive surgery  08/1999  . Facial cosmetic surgery  2006    face lift  . Endometrial biopsy      neg for CA cells  . Cesarean section      times 2  . Cataract surgery  2011   Social History  Substance Use Topics  . Smoking status: Never Smoker   . Smokeless  tobacco: Never Used  . Alcohol Use: No   Family History  Problem Relation Age of Onset  . Stroke Mother   . Hypertension Mother   . Osteoporosis Mother   . Alzheimer's disease Father   . Hypertension Father   . Osteoporosis Father   . Depression Sister   . Hypertension Sister   . Osteoporosis Sister   . Thyroid disease Sister   . COPD Brother   . Parkinson's disease Sister   . Osteoporosis Sister    No Known Allergies Current Outpatient Prescriptions on File Prior to Visit  Medication Sig Dispense Refill  . albuterol (PROVENTIL HFA;VENTOLIN HFA) 108 (90 BASE) MCG/ACT inhaler Inhale 2 puffs into the lungs every 4 (four) hours as needed. 1 Inhaler 5  . CALCIUM PO Take by mouth as needed.     . cetirizine (ZYRTEC) 10 MG tablet Take 10 mg by mouth daily.    . Coenzyme Q10 (COQ10 PO) Take by mouth daily.    Marland Kitchen FLUoxetine (PROZAC) 40 MG capsule Take 1 capsule (40 mg total) by mouth daily. 90 capsule 3  . fluticasone (FLONASE) 50 MCG/ACT nasal spray Place 2 sprays into the nose as needed.    Marland Kitchen  Multiple Vitamins-Minerals (MULTIVITAMIN PO) Take by mouth as needed.     . naproxen sodium (ANAPROX) 220 MG tablet Take 220 mg by mouth as needed.    . Omega-3 Fatty Acids (FISH OIL PO) Take by mouth as needed.     . vitamin C (ASCORBIC ACID) 500 MG tablet Take 500 mg by mouth daily.     No current facility-administered medications on file prior to visit.    Review of Systems Review of Systems  Constitutional: Negative for fever, appetite change, fatigue and unexpected weight change.  ENT pos for post nasal drip /neg for facial pain  Eyes: Negative for pain and visual disturbance.  Respiratory: Negative for  shortness of breath.  pos for cough Cardiovascular: Negative for cp or palpitations    Gastrointestinal: Negative for nausea, diarrhea and constipation.  Genitourinary: Negative for urgency and frequency.  Skin: Negative for pallor or rash   Neurological: Negative for weakness,  light-headedness, numbness and headaches.  Hematological: Negative for adenopathy. Does not bruise/bleed easily.  Psychiatric/Behavioral: Negative for dysphoric mood. The patient is not nervous/anxious.         Objective:   Physical Exam  Constitutional: She appears well-developed and well-nourished. No distress.  obese and well appearing   HENT:  Head: Normocephalic and atraumatic.  Right Ear: External ear normal.  Left Ear: External ear normal.  Mouth/Throat: Oropharynx is clear and moist.  Nares are injected and congested  No sinus tenderness Clear rhinorrhea and post nasal drip    Eyes: Conjunctivae and EOM are normal. Pupils are equal, round, and reactive to light. Right eye exhibits no discharge. Left eye exhibits no discharge.  Neck: Normal range of motion. Neck supple.  Cardiovascular: Normal rate and normal heart sounds.   Pulmonary/Chest: Effort normal and breath sounds normal. No respiratory distress. She has no wheezes. She has no rales. She exhibits no tenderness.  No wheeze even on forced expiration   Harsh cough  Lymphadenopathy:    She has no cervical adenopathy.  Neurological: She is alert.  Skin: Skin is warm and dry. No rash noted.  Psychiatric: She has a normal mood and affect.          Assessment & Plan:   Problem List Items Addressed This Visit      Other   Post-viral cough syndrome - Primary    Ongoing  Trial of tessalon tid and mucinex dm during the day Px for robitussin AC at night -with warning of sedation  Fluids/rest  Update if not starting to improve in a week or if worsening

## 2015-09-09 ENCOUNTER — Ambulatory Visit (INDEPENDENT_AMBULATORY_CARE_PROVIDER_SITE_OTHER): Payer: Medicare Other

## 2015-09-09 DIAGNOSIS — Z23 Encounter for immunization: Secondary | ICD-10-CM

## 2015-09-25 IMAGING — US US THYROID
1 series · 13 of 25 positions shown · non-contrast
Comparison: [DATE], [DATE]

CLINICAL DATA: Left thyroid nodule

EXAM:
THYROID ULTRASOUND
TECHNIQUE: Ultrasound examination of the thyroid gland and adjacent soft
tissues was performed.

[Series 1: us thyroid · 0.07mm/px · 13 of 47 slices shown]
[im 1/47]
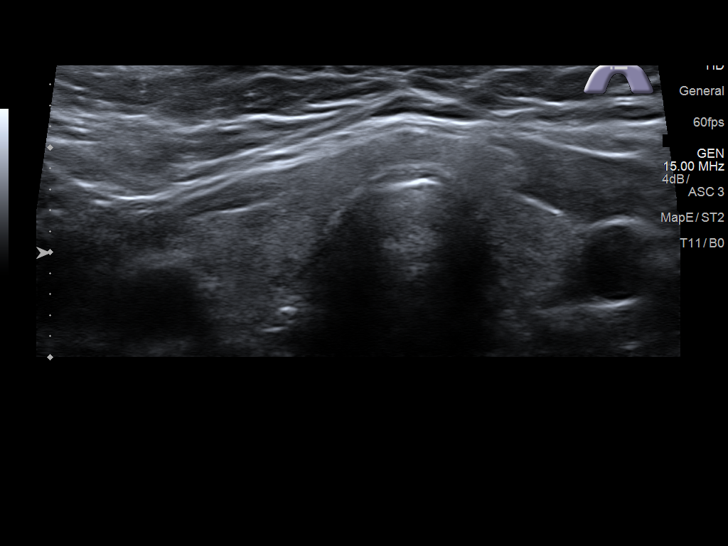
[im 4/47]
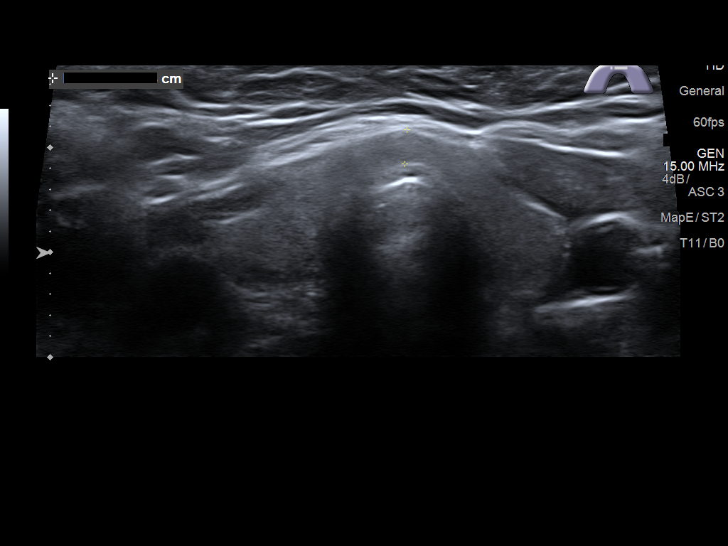
[im 8/47]
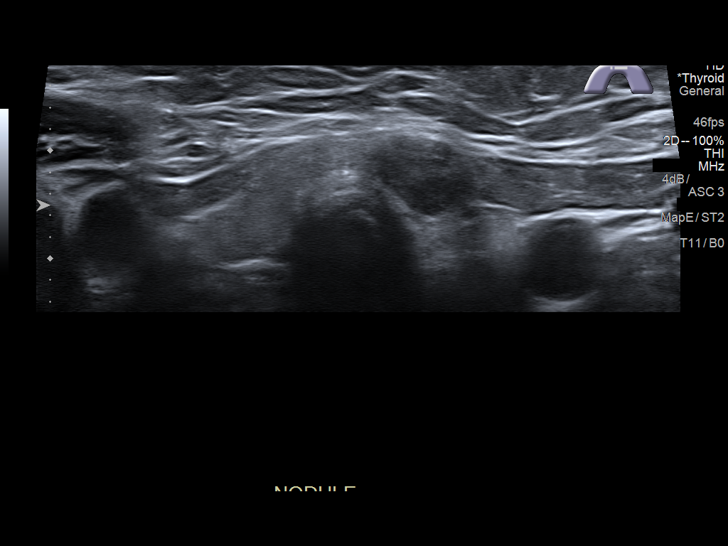
[im 12/47]
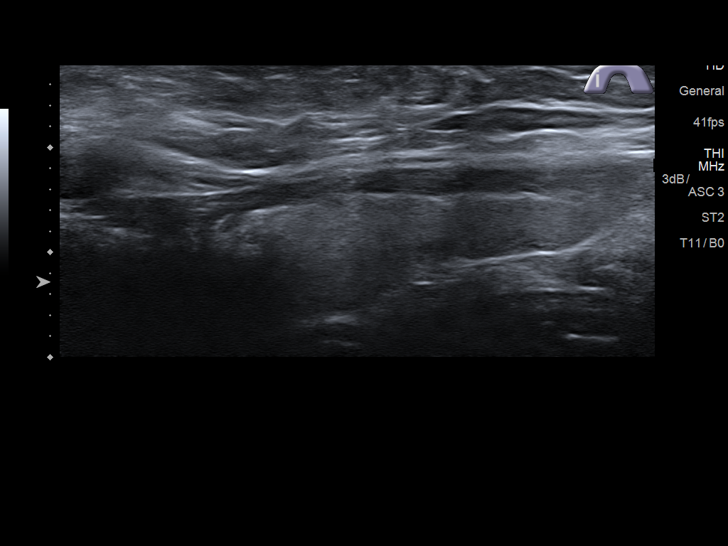
[im 16/47]
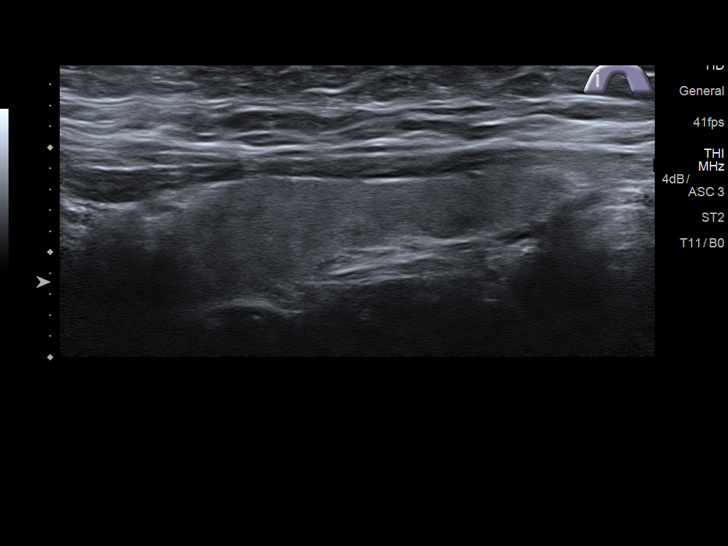
[im 20/47]
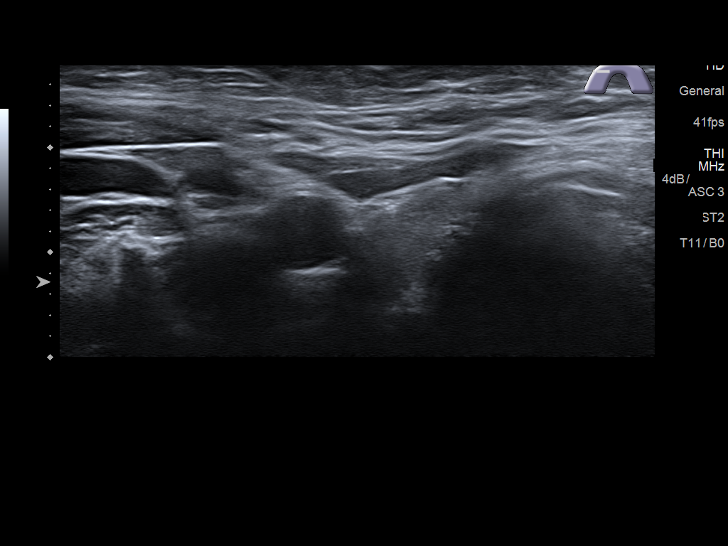
[im 24/47]
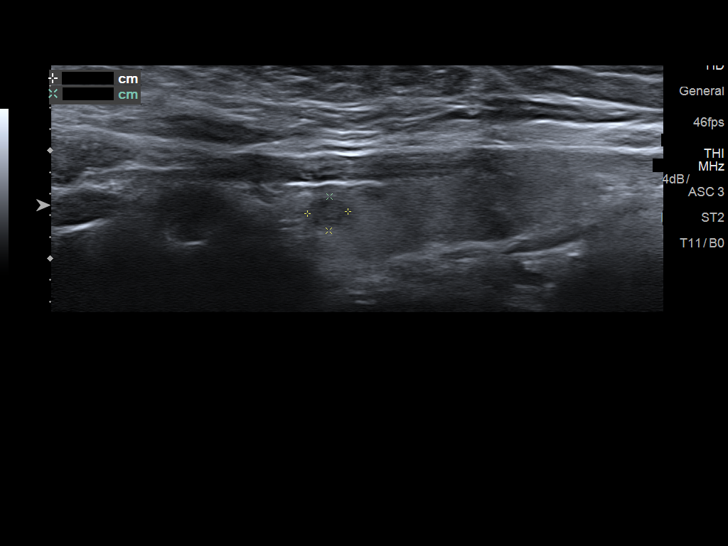
[im 27/47]
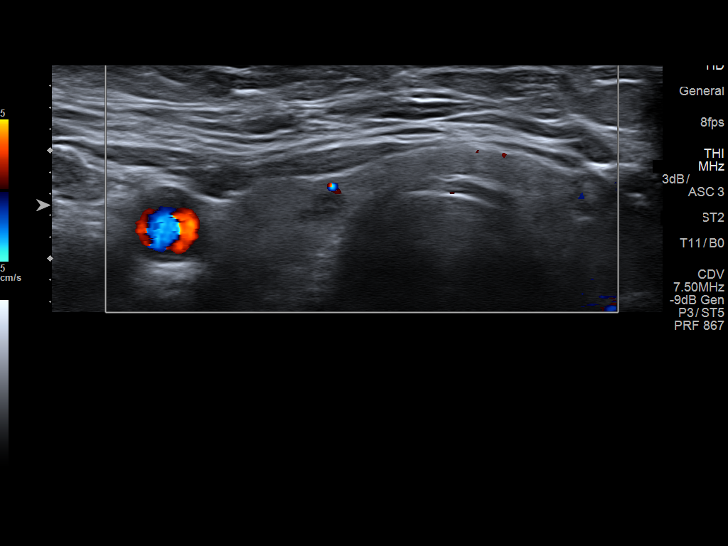
[im 31/47]
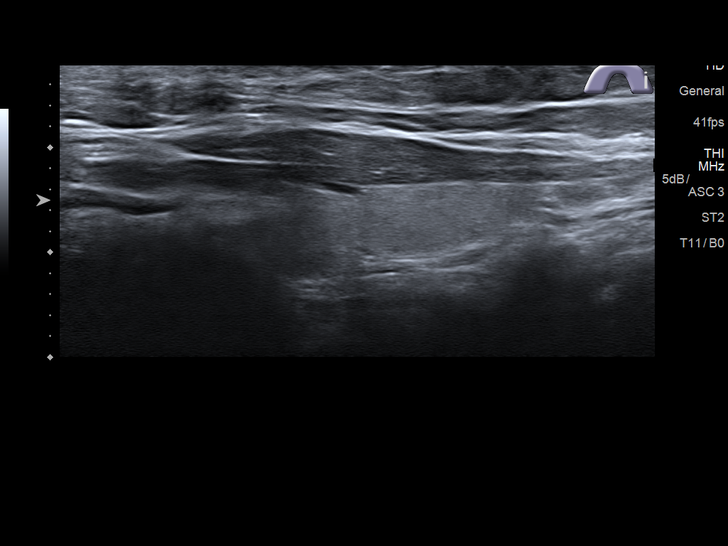
[im 35/47]
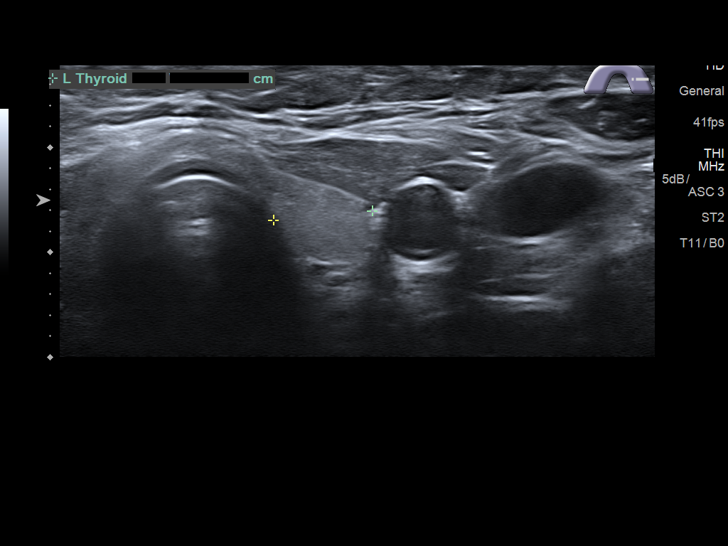
[im 39/47]
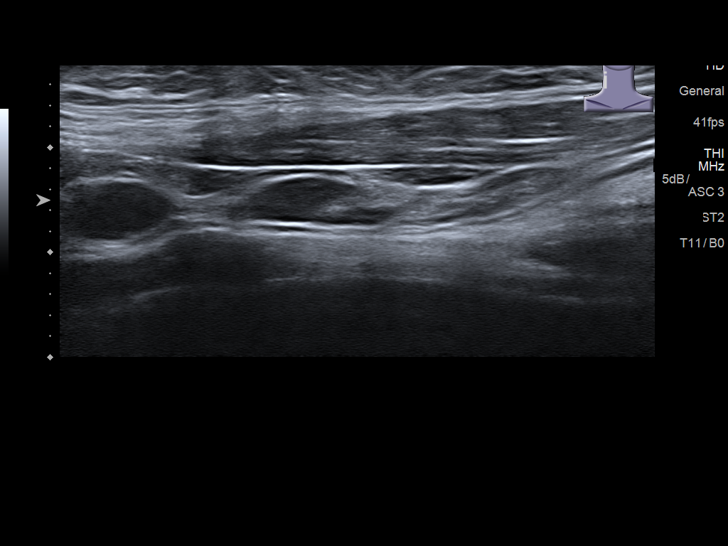
[im 43/47]
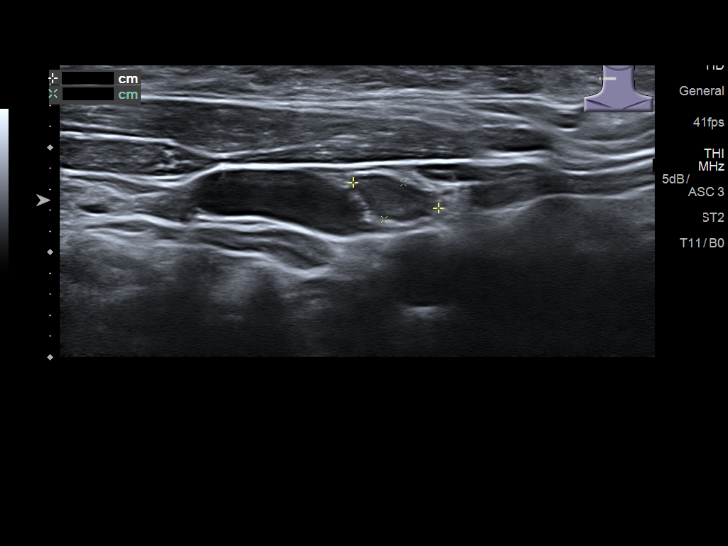
[im 47/47]
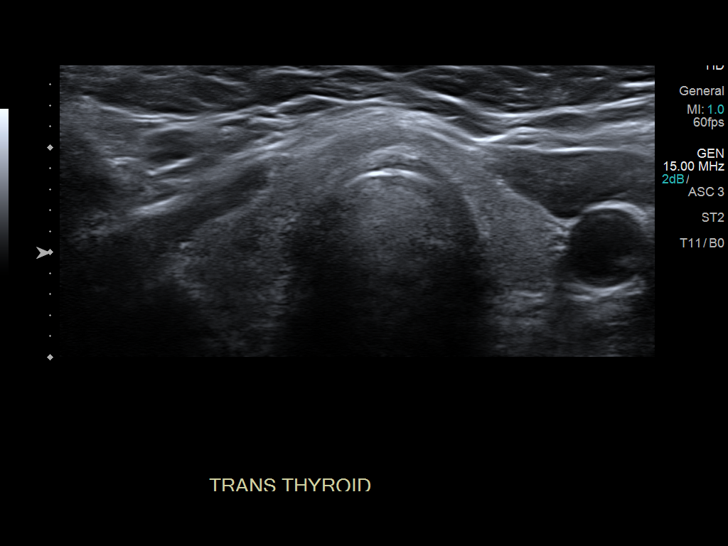

[13 of 25 positions shown; findings below may reference images not displayed]

FINDINGS: Parenchymal Echotexture: Mildly heterogenous

Isthmus: 0.3 cm thickness, stable

Right lobe: 3.5 x 1.1 x 1.2 cm, previously 3.7 x 1 x

Left lobe:  3.3 x 0.9 x 1 cm, previously 3 x 0.9 x

_________________________________________________________

Estimated total number of nodules >/= 1 cm: 1

Number of spongiform nodules >/=  2 cm not described below (TR1): 0

Number of mixed cystic and solid nodules >/= 1.5 cm not described
below (TR2): 0

_________________________________________________________

Nodule # 1:

Prior biopsy: No

Location: Isthmus; left of midline

Maximum size: 1.3 cm; Other 2 dimensions: 0.7 x 0.9 cm, previously,
1.1 x 0.8 x 1 cm

Composition: solid/almost completely solid (2)

Echogenicity: hypoechoic (2)

Shape: not taller-than-wide (0)

Margins: smooth (0)

Echogenic foci: punctate echogenic foci (3)

ACR TI-RADS total points: 7.

ACR TI-RADS risk category:  TR5 (>/= 7 points).

Significant change in size (>/= 20% in two dimensions and minimal
increase of 2 mm): No

Change in features: Yes

Change in ACR TI-RADS risk category: Yes

ACR TI-RADS recommendations:

**Given size (>/= 1.0 cm) and appearance, fine needle aspiration of
this highly suspicious nodule should be considered based on TI-RADS
criteria.

_________________________________________________________
IMPRESSION: 1. Solitary left isthmic lesion is slightly larger with new punctate
echogenic foci. Recommend FNA biopsy.

The above is in keeping with the ACR TI-RADS recommendations - [HOSPITAL] [90];[DATE].

## 2016-04-17 ENCOUNTER — Telehealth: Payer: Self-pay | Admitting: Family Medicine

## 2016-04-17 DIAGNOSIS — R739 Hyperglycemia, unspecified: Secondary | ICD-10-CM

## 2016-04-17 DIAGNOSIS — Z0000 Encounter for general adult medical examination without abnormal findings: Secondary | ICD-10-CM

## 2016-04-17 NOTE — Telephone Encounter (Signed)
-----   Message from Ellamae Sia sent at 04/15/2016 10:38 AM EDT ----- Regarding: Lab orders for Friday, 8.18.17 Patient is scheduled for CPX labs, please order future labs, Thanks , Karna Christmas

## 2016-04-20 ENCOUNTER — Other Ambulatory Visit (INDEPENDENT_AMBULATORY_CARE_PROVIDER_SITE_OTHER): Payer: Medicare Other

## 2016-04-20 DIAGNOSIS — R739 Hyperglycemia, unspecified: Secondary | ICD-10-CM | POA: Diagnosis not present

## 2016-04-20 DIAGNOSIS — Z0000 Encounter for general adult medical examination without abnormal findings: Secondary | ICD-10-CM | POA: Diagnosis not present

## 2016-04-20 LAB — CBC WITH DIFFERENTIAL/PLATELET
BASOS ABS: 0 10*3/uL (ref 0.0–0.1)
Basophils Relative: 0.5 % (ref 0.0–3.0)
EOS ABS: 0.2 10*3/uL (ref 0.0–0.7)
EOS PCT: 2.4 % (ref 0.0–5.0)
HCT: 40.4 % (ref 36.0–46.0)
Hemoglobin: 13.8 g/dL (ref 12.0–15.0)
LYMPHS ABS: 1.7 10*3/uL (ref 0.7–4.0)
Lymphocytes Relative: 23.3 % (ref 12.0–46.0)
MCHC: 34.1 g/dL (ref 30.0–36.0)
MCV: 92.9 fl (ref 78.0–100.0)
Monocytes Absolute: 0.5 10*3/uL (ref 0.1–1.0)
Monocytes Relative: 7.4 % (ref 3.0–12.0)
NEUTROS PCT: 66.4 % (ref 43.0–77.0)
Neutro Abs: 4.8 10*3/uL (ref 1.4–7.7)
PLATELETS: 262 10*3/uL (ref 150.0–400.0)
RBC: 4.35 Mil/uL (ref 3.87–5.11)
RDW: 12.6 % (ref 11.5–15.5)
WBC: 7.3 10*3/uL (ref 4.0–10.5)

## 2016-04-20 LAB — COMPREHENSIVE METABOLIC PANEL
ALBUMIN: 4.3 g/dL (ref 3.5–5.2)
ALK PHOS: 82 U/L (ref 39–117)
ALT: 14 U/L (ref 0–35)
AST: 16 U/L (ref 0–37)
BILIRUBIN TOTAL: 0.5 mg/dL (ref 0.2–1.2)
BUN: 12 mg/dL (ref 6–23)
CHLORIDE: 103 meq/L (ref 96–112)
CO2: 29 mEq/L (ref 19–32)
CREATININE: 0.76 mg/dL (ref 0.40–1.20)
Calcium: 9.5 mg/dL (ref 8.4–10.5)
GFR: 80.53 mL/min (ref >60.00)
Glucose, Bld: 115 mg/dL — ABNORMAL HIGH (ref 70–99)
Potassium: 4.5 mEq/L (ref 3.5–5.1)
SODIUM: 138 meq/L (ref 135–145)
TOTAL PROTEIN: 7.1 g/dL (ref 6.0–8.3)

## 2016-04-20 LAB — LIPID PANEL
CHOLESTEROL: 189 mg/dL (ref 0–200)
HDL: 47.3 mg/dL (ref >39.00)
LDL Cholesterol: 116 mg/dL — ABNORMAL HIGH (ref 0–99)
NonHDL: 141.94
TRIGLYCERIDES: 128 mg/dL (ref 0.0–149.0)
Total CHOL/HDL Ratio: 4
VLDL: 25.6 mg/dL (ref 0.0–40.0)

## 2016-04-20 LAB — TSH: TSH: 1.63 u[IU]/mL (ref 0.35–4.50)

## 2016-04-20 LAB — HEMOGLOBIN A1C: HEMOGLOBIN A1C: 6.1 % (ref 4.6–6.5)

## 2016-04-22 ENCOUNTER — Other Ambulatory Visit: Payer: Medicare Other

## 2016-04-29 ENCOUNTER — Ambulatory Visit (INDEPENDENT_AMBULATORY_CARE_PROVIDER_SITE_OTHER): Payer: Medicare Other | Admitting: Family Medicine

## 2016-04-29 ENCOUNTER — Ambulatory Visit (INDEPENDENT_AMBULATORY_CARE_PROVIDER_SITE_OTHER): Payer: Medicare Other

## 2016-04-29 ENCOUNTER — Encounter: Payer: Self-pay | Admitting: Family Medicine

## 2016-04-29 VITALS — BP 122/80 | HR 68 | Temp 97.8°F | Ht 63.5 in | Wt 219.8 lb

## 2016-04-29 DIAGNOSIS — Z1159 Encounter for screening for other viral diseases: Secondary | ICD-10-CM

## 2016-04-29 DIAGNOSIS — E785 Hyperlipidemia, unspecified: Secondary | ICD-10-CM

## 2016-04-29 DIAGNOSIS — Z23 Encounter for immunization: Secondary | ICD-10-CM

## 2016-04-29 DIAGNOSIS — R739 Hyperglycemia, unspecified: Secondary | ICD-10-CM | POA: Diagnosis not present

## 2016-04-29 DIAGNOSIS — M8580 Other specified disorders of bone density and structure, unspecified site: Secondary | ICD-10-CM | POA: Diagnosis not present

## 2016-04-29 DIAGNOSIS — Z0000 Encounter for general adult medical examination without abnormal findings: Secondary | ICD-10-CM

## 2016-04-29 DIAGNOSIS — E041 Nontoxic single thyroid nodule: Secondary | ICD-10-CM

## 2016-04-29 DIAGNOSIS — E669 Obesity, unspecified: Secondary | ICD-10-CM

## 2016-04-29 MED ORDER — FLUOXETINE HCL 40 MG PO CAPS: 40.0000 mg | ORAL_CAPSULE | Freq: Every day | ORAL | 3 refills | Status: DC

## 2016-04-29 NOTE — Progress Notes (Signed)
PCP notes:   Health maintenance:  Flu vaccine - administered Shingles - postponed/insurance Hep C screening - completed  Abnormal screenings:   Depression score: 1  Patient concerns:   None  Nurse concerns:  None  Next PCP appt:   04/29/2016 @ 0930  I reviewed health advisor's note, was available for consultation, and agree with documentation and plan. Loura Pardon MD

## 2016-04-29 NOTE — Progress Notes (Signed)
Subjective:    Patient ID: Deborah Carey, female    DOB: 03-May-1951, 65 y.o.   MRN: [id number]  HPI Here for health maintenance exam and to review chronic medical problems    Good summer- traveled to [place] tired and lethargic at times  Not enough exercise  Maybe a little down  Still gets joy out of things   Would be open to an exercise program - used to go to [employer]  She sweats a lot and face sweats -this really bothers her   Pt had AMW today  Had a flu shot today  Wt Readings from Last 3 Encounters:  04/29/16 219 lb 12 oz (99.7 kg)  04/29/16 219 lb 12 oz (99.7 kg)  06/29/15 227 lb 8 oz (103.2 kg)  bmi is 38.2 Diet is fair -eats too many sweets -she really craves them    Zoster vaccine = she is interested in a vaccine but ins does not cover it   Mammogram 8/16-normal- is due (got a letter)-she will schedule her own -[place]  Self breast exam- no lumps or changes   Colonoscopy 2/10-normal, 10 year recall   dexa -4/15- very mild osteopenia Hx of L fibular fracture in the past  Takes ca and D 50% of time  No exercise  Declines dexa at this time    Hep C screen negative   Pap 9/16-neg (gyn) No vaginal bleeding   Hx of hyperlipidemia Lab Results  Component Value Date   CHOL 189 04/20/2016   CHOL 199 04/20/2015   CHOL 206 (H) 10/30/2013   Lab Results  Component Value Date   HDL 47.30 04/20/2016   HDL 48.30 04/20/2015   HDL 44.90 10/30/2013   Lab Results  Component Value Date   LDLCALC 116 (H) 04/20/2016   LDLCALC 130 (H) 04/20/2015   LDLCALC 116 (H) 10/31/2012   Lab Results  Component Value Date   TRIG 128.0 04/20/2016   TRIG 105.0 04/20/2015   TRIG 140.0 10/30/2013   Lab Results  Component Value Date   CHOLHDL 4 04/20/2016   CHOLHDL 4 04/20/2015   CHOLHDL 5 10/30/2013   Lab Results  Component Value Date   LDLDIRECT 141.8 10/30/2013  is improved (LDL)  Is trying to eat less sat fats   Hx of hyperglycemia Lab Results    Component Value Date   HGBA1C 6.1 04/20/2016  last time was 6.0 Likes carbs  No diabetes in the family    Hx of thyroid nodule in the past  Lab Results  Component Value Date   TSH 1.63 04/20/2016   she has not felt a change in her neck  Due for a re check   Patient Active Problem List   Diagnosis Date Noted  . Left fibular fracture 01/21/2014  . Osteopenia 01/07/2014  . Family history of osteoporosis 11/29/2013  . Estrogen deficiency 11/29/2013  . Hyperlipidemia 11/29/2013  . Encounter for Medicare annual wellness exam 10/29/2013  . Irregular heartbeat 10/07/2013  . Left thyroid nodule 09/26/2013  . Enlarged thyroid 09/16/2013  . Primary osteoarthritis of both knees 11/05/2012  . Other screening mammogram 09/27/2011  . Routine general medical examination at a health care facility 09/19/2011  . Obesity 06/06/2008  . Hyperglycemia 06/06/2008  . ANXIETY 04/17/2007  . ALLERGIC RHINITIS 04/17/2007   Past Medical History:  Diagnosis Date  . Allergy    allergic rhinitis  . Anxiety   . Infertility, female   . Osteoarthritis of both knees   .  Post-menopausal bleeding    neg endo bx.   Past Surgical History:  Procedure Laterality Date  . cataract surgery  2011  . CESAREAN SECTION     times 2  . ENDOMETRIAL BIOPSY     neg for CA cells  . FACIAL COSMETIC SURGERY  2006   face lift  . FOOT SURGERY  1998   rt heel spur removal  . REFRACTIVE SURGERY  08/1999   Social History  Substance Use Topics  . Smoking status: Never Smoker  . Smokeless tobacco: Never Used  . Alcohol use No   Family History  Problem Relation Age of Onset  . COPD Brother   . Stroke Mother   . Hypertension Mother   . Osteoporosis Mother   . Alzheimer's disease Father   . Hypertension Father   . Osteoporosis Father   . Depression Sister   . Hypertension Sister   . Osteoporosis Sister   . Thyroid disease Sister   . Parkinson's disease Sister   . Osteoporosis Sister    No Known  Allergies Current Outpatient Prescriptions on File Prior to Visit  Medication Sig Dispense Refill  . albuterol (PROVENTIL HFA;VENTOLIN HFA) 108 (90 BASE) MCG/ACT inhaler Inhale 2 puffs into the lungs every 4 (four) hours as needed. 1 Inhaler 5  . CALCIUM PO Take by mouth as needed.     . cetirizine (ZYRTEC) 10 MG tablet Take 10 mg by mouth daily.    . fluticasone (FLONASE) 50 MCG/ACT nasal spray Place 2 sprays into the nose as needed.    . Multiple Vitamins-Minerals (MULTIVITAMIN PO) Take by mouth as needed.     . naproxen sodium (ANAPROX) 220 MG tablet Take 220 mg by mouth as needed.    . Omega-3 Fatty Acids (FISH OIL PO) Take by mouth as needed.      No current facility-administered medications on file prior to visit.     Review of Systems Review of Systems  Constitutional: Negative for fever, appetite change, and unexpected weight change. pos for lethargy Eyes: Negative for pain and visual disturbance.  Respiratory: Negative for cough and shortness of breath.   Cardiovascular: Negative for cp or palpitations    Gastrointestinal: Negative for nausea, diarrhea and constipation.  Genitourinary: Negative for urgency and frequency.  Skin: Negative for pallor or rash   Neurological: Negative for weakness, light-headedness, numbness and headaches.  Hematological: Negative for adenopathy. Does not bruise/bleed easily.  Psychiatric/Behavioral: Negative for dysphoric mood. The patient is not nervous/anxious.  pos for lack of motivation        Objective:   Physical Exam  Constitutional: She appears well-developed and well-nourished. No distress.  obese and well appearing   HENT:  Head: Normocephalic and atraumatic.  Right Ear: External ear normal.  Left Ear: External ear normal.  Mouth/Throat: Oropharynx is clear and moist.  Eyes: Conjunctivae and EOM are normal. Pupils are equal, round, and reactive to light. No scleral icterus.  Neck: Normal range of motion. Neck supple. No JVD  present. Carotid bruit is not present. No thyromegaly present.  Cardiovascular: Normal rate, regular rhythm, normal heart sounds and intact distal pulses.  Exam reveals no gallop.   Pulmonary/Chest: Effort normal and breath sounds normal. No respiratory distress. She has no wheezes. She exhibits no tenderness.  Abdominal: Soft. Bowel sounds are normal. She exhibits no distension, no abdominal bruit and no mass. There is no tenderness.  Genitourinary: No breast swelling, tenderness, discharge or bleeding.  Genitourinary Comments: Breast exam: No mass, nodules,  thickening, tenderness, bulging, retraction, inflamation, nipple discharge or skin changes noted.  No axillary or clavicular LA.       Musculoskeletal: Normal range of motion. She exhibits no edema or tenderness.  Lymphadenopathy:    She has no cervical adenopathy.  Neurological: She is alert. She has normal reflexes. No cranial nerve deficit. She exhibits normal muscle tone. Coordination normal.  Skin: Skin is warm and dry. No rash noted. No erythema. No pallor.  Fair complexion Few brown nevi and angiomas on trunk   Psychiatric: She has a normal mood and affect.          Assessment & Plan:   Problem List Items Addressed This Visit      Endocrine   Left thyroid nodule    Due for thyroid US re check - ordered No clinical changes       Relevant Orders   US Soft Tissue Head/Neck     Musculoskeletal and Integument   Osteopenia    Pt declines another dexa at this time  Disc need for calcium/ vitamin D/ wt bearing exercise and bone density test every 2 y to monitor Disc safety/ fracture risk in detail   Past hx of fibular fracture         Other   Routine general medical examination at a health care facility    Reviewed health habits including diet and exercise and skin cancer prevention Reviewed appropriate screening tests for age  Also reviewed health mt list, fam hx and immunization status , as well as social and  family history   See HPI Rev AMW from today Labs reviewed  Enc wt loss and exercise Declines dexa If you are interested in a shingles/zoster vaccine - call your insurance to check on coverage,( you should not get it within 1 month of other vaccines) , then call us for a prescription  for it to take to a pharmacy that gives the shot , or make a nurse visit to get it here depending on your cover Don't forget to schedule your mammogram  Take your calcium and D every day  Stop at check out to plan thyroid ultrasound       Obesity    Discussed how this problem influences overall health and the risks it imposes  Reviewed plan for weight loss with lower calorie diet (via better food choices and also portion control or program like weight watchers) and exercise building up to or more than 30 minutes 5 days per week including some aerobic activity   Pt is not motivated- strongly dislikes physical activity or sweating -doubts she will exercise       Hyperlipidemia    Disc goals for lipids and reasons to control them Rev labs with pt Rev low sat fat diet in detail Mildly improved LD       Hyperglycemia - Primary    Lab Results  Component Value Date   HGBA1C 6.1 04/20/2016   Disc borderline DM status  Enc low glycemic diet and exercise to prevent DM       Other Visit Diagnoses   None.

## 2016-04-29 NOTE — Progress Notes (Signed)
Pre visit review using our clinic review tool, if applicable. No additional management support is needed unless otherwise documented below in the visit note. 

## 2016-04-29 NOTE — Patient Instructions (Signed)
Deborah Carey , Thank you for taking time to come for your Medicare Wellness Visit. I appreciate your ongoing commitment to your health goals. Please review the following plan we discussed and let me know if I can assist you in the future.   These are the goals we discussed: Goals    . Increase physical activity          Starting 04/29/2016, I will continue to exercise with Silver Sneakers program at least 60 min every other week.       This is a list of the screening recommended for you and due dates:  Health Maintenance  Topic Date Due  . Shingles Vaccine  04/29/2017*  . Mammogram  04/13/2017  . Colon Cancer Screening  10/06/2018  . Tetanus Vaccine  11/06/2022  . Flu Shot  Completed  . DEXA scan (bone density measurement)  Completed  .  Hepatitis C: One time screening is recommended by Center for Disease Control  (CDC) for  adults born from [birth year] through 1967.   Completed  . Pneumonia vaccines  Completed  *Topic was postponed. The date shown is not the original due date.   Preventive Care for Adults  A healthy lifestyle and preventive care can promote health and wellness. Preventive health guidelines for adults include the following key practices.  . A routine yearly physical is a good way to check with your health care provider about your health and preventive screening. It is a chance to share any concerns and updates on your health and to receive a thorough exam.  . Visit your dentist for a routine exam and preventive care every 6 months. Brush your teeth twice a day and floss once a day. Good oral hygiene prevents tooth decay and gum disease.  . The frequency of eye exams is based on your age, health, family medical history, use  of contact lenses, and other factors. Follow your health care provider's ecommendations for frequency of eye exams.  . Eat a healthy diet. Foods like vegetables, fruits, whole grains, low-fat dairy products, and lean protein foods contain the nutrients  you need without too many calories. Decrease your intake of foods high in solid fats, added sugars, and salt. Eat the right amount of calories for you. Get information about a proper diet from your health care provider, if necessary.  . Regular physical exercise is one of the most important things you can do for your health. Most adults should get at least 150 minutes of moderate-intensity exercise (any activity that increases your heart rate and causes you to sweat) each week. In addition, most adults need muscle-strengthening exercises on 2 or more days a week.  Silver Sneakers may be a benefit available to you. To determine eligibility, you may visit the website: www.silversneakers.com or contact program at [phone] Mon-Fri between 8AM-8PM.   . Maintain a healthy weight. The body mass index (BMI) is a screening tool to identify possible weight problems. It provides an estimate of body fat based on height and weight. Your health care provider can find your BMI and can help you achieve or maintain a healthy weight.   For adults 20 years and older: ? A BMI below 18.5 is considered underweight. ? A BMI of 18.5 to 24.9 is normal. ? A BMI of 25 to 29.9 is considered overweight. ? A BMI of 30 and above is considered obese.   . Maintain normal blood lipids and cholesterol levels by exercising and minimizing your intake of saturated fat.  Eat a balanced diet with plenty of fruit and vegetables. Blood tests for lipids and cholesterol should begin at age 100 and be repeated every 5 years. If your lipid or cholesterol levels are high, you are over 50, or you are at high risk for heart disease, you may need your cholesterol levels checked more frequently. Ongoing high lipid and cholesterol levels should be treated with medicines if diet and exercise are not working.  . If you smoke, find out from your health care provider how to quit. If you do not use tobacco, please do not start.  . If you choose to  drink alcohol, please do not consume more than 2 drinks per day. One drink is considered to be 12 ounces (355 mL) of beer, 5 ounces (148 mL) of wine, or 1.5 ounces (44 mL) of liquor.  . If you are 60-67 years old, ask your health care provider if you should take aspirin to prevent strokes.  . Use sunscreen. Apply sunscreen liberally and repeatedly throughout the day. You should seek shade when your shadow is shorter than you. Protect yourself by wearing long sleeves, pants, a wide-brimmed hat, and sunglasses year round, whenever you are outdoors.  . Once a month, do a whole body skin exam, using a mirror to look at the skin on your back. Tell your health care provider of new moles, moles that have irregular borders, moles that are larger than a pencil eraser, or moles that have changed in shape or color.

## 2016-04-29 NOTE — Progress Notes (Signed)
Subjective:   Deborah Carey is a 65 y.o. female who presents for Medicare Annual (Subsequent) preventive examination.  Review of Systems:  N/A Cardiac Risk Factors include: sedentary lifestyle;advanced age (>40men, >52 women)     Objective:     Vitals: BP 122/80 (BP Location: Left Arm, Patient Position: Sitting, Cuff Size: Normal)   Pulse 68   Temp 97.8 F (36.6 C) (Oral)   Ht 5' 3.5" (1.613 m) Comment: no shoes  Wt 219 lb 12 oz (99.7 kg)   LMP 09/05/2008   SpO2 98%   BMI 38.32 kg/m   Body mass index is 38.32 kg/m.   Tobacco History  Smoking Status  . Never Smoker  Smokeless Tobacco  . Never Used     Counseling given: No   Past Medical History:  Diagnosis Date  . Allergy    allergic rhinitis  . Anxiety   . Infertility, female   . Osteoarthritis of both knees   . Post-menopausal bleeding    neg endo bx.   Past Surgical History:  Procedure Laterality Date  . cataract surgery  2011  . CESAREAN SECTION     times 2  . ENDOMETRIAL BIOPSY     neg for CA cells  . FACIAL COSMETIC SURGERY  2006   face lift  . FOOT SURGERY  1998   rt heel spur removal  . REFRACTIVE SURGERY  08/1999   Family History  Problem Relation Age of Onset  . COPD Brother   . Stroke Mother   . Hypertension Mother   . Osteoporosis Mother   . Alzheimer's disease Father   . Hypertension Father   . Osteoporosis Father   . Depression Sister   . Hypertension Sister   . Osteoporosis Sister   . Thyroid disease Sister   . Parkinson's disease Sister   . Osteoporosis Sister    History  Sexual Activity  . Sexual activity: Yes  . Partners: Male  . Birth control/ protection: Post-menopausal    Outpatient Encounter Prescriptions as of 04/29/2016  Medication Sig  . albuterol (PROVENTIL HFA;VENTOLIN HFA) 108 (90 BASE) MCG/ACT inhaler Inhale 2 puffs into the lungs every 4 (four) hours as needed.  Marland Kitchen CALCIUM PO Take by mouth as needed.   . cetirizine (ZYRTEC) 10 MG tablet Take 10 mg by  mouth daily.  . fluticasone (FLONASE) 50 MCG/ACT nasal spray Place 2 sprays into the nose as needed.  . Multiple Vitamins-Minerals (MULTIVITAMIN PO) Take by mouth as needed.   . naproxen sodium (ANAPROX) 220 MG tablet Take 220 mg by mouth as needed.  . Omega-3 Fatty Acids (FISH OIL PO) Take by mouth as needed.   . [DISCONTINUED] FLUoxetine (PROZAC) 40 MG capsule Take 1 capsule (40 mg total) by mouth daily.  . [DISCONTINUED] benzonatate (TESSALON) 200 MG capsule Take 1 capsule (200 mg total) by mouth 3 (three) times daily as needed for cough (swallow whole, do not chew pill).  . [DISCONTINUED] Coenzyme Q10 (COQ10 PO) Take by mouth daily.  . [DISCONTINUED] guaiFENesin-codeine (ROBITUSSIN AC) 100-10 MG/5ML syrup Take 5 mLs by mouth at bedtime as needed for cough (warning- may sedate).  . [DISCONTINUED] vitamin C (ASCORBIC ACID) 500 MG tablet Take 500 mg by mouth daily.   No facility-administered encounter medications on file as of 04/29/2016.     Activities of Daily Living In your present state of health, do you have any difficulty performing the following activities: 04/29/2016  Hearing? N  Vision? N  Difficulty concentrating or making  decisions? N  Walking or climbing stairs? N  Dressing or bathing? N  Doing errands, shopping? N  Preparing Food and eating ? N  Using the Toilet? N  In the past six months, have you accidently leaked urine? N  Do you have problems with loss of bowel control? N  Managing your Medications? N  Managing your Finances? N  Housekeeping or managing your Housekeeping? N  Some recent data might be hidden    Patient Care Team: Abner Greenspan, MD as PCP - [place], CNM as Referring Physician (Certified Nurse Midwife) Conception Chancy, DDS as Referring Physician (Dentistry)    Assessment:     Hearing Screening   125Hz  250Hz  500Hz  1000Hz  2000Hz  3000Hz  4000Hz  6000Hz  8000Hz   Right ear:   40 40 40  40    Left ear:   40 40 40  40      Visual Acuity  Screening   Right eye Left eye Both eyes  Without correction: 20/200 20/25 20/25  With correction:       Exercise Activities and Dietary recommendations Current Exercise Habits: Structured exercise class, Type of exercise: Other - see comments (Silver Sneakers), Time (Minutes): 60, Intensity: Moderate, Exercise limited by: None identified  Goals    . Increase physical activity          Starting 04/29/2016, I will continue to exercise with Silver Sneakers program at least 60 min every other week.      Fall Risk Fall Risk  04/29/2016 04/28/2015 11/29/2013  Falls in the past year? No Yes No  Number falls in past yr: - 1 -  Risk for fall due to : - Impaired balance/gait -   Depression Screen PHQ 2/9 Scores 04/29/2016 04/28/2015 11/29/2013  PHQ - 2 Score 1 0 0     Cognitive Testing MMSE - Mini Mental State Exam 04/29/2016  Orientation to time 5  Orientation to Place 5  Registration 3  Attention/ Calculation 0  Recall 3  Language- name 2 objects 0  Language- repeat 1  Language- follow 3 step command 3  Language- read & follow direction 0  Write a sentence 0  Copy design 0  Total score 20   PLEASE NOTE: A Mini-Cog screen was completed. Maximum score is 20. A value of 0 denotes this part of Folstein MMSE was not completed or the patient failed this part of the Mini-Cog screening.   Mini-Cog Screening Orientation to Time - Max 5 pts Orientation to Place - Max 5 pts Registration - Max 3 pts Recall - Max 3 pts Language Repeat - Max 1 pts Language Follow 3 Step Command - Max 3 pts  Immunization History  Administered Date(s) Administered  . Influenza Split 09/27/2011  . Influenza,inj,Quad PF,36+ Mos 09/09/2015, 04/29/2016  . Pneumococcal Conjugate-13 04/28/2015  . Pneumococcal Polysaccharide-23 11/29/2013  . Td 01/21/2003  . Tdap 11/05/2012   Screening Tests Health Maintenance  Topic Date Due  . ZOSTAVAX  04/29/2017 (Originally 09/07/2008)  . MAMMOGRAM  04/13/2017  .  COLONOSCOPY  10/06/2018  . TETANUS/TDAP  11/06/2022  . INFLUENZA VACCINE  Completed  . DEXA SCAN  Completed  . Hepatitis C Screening  Completed  . PNA vac Low Risk Adult  Completed      Plan:     I have personally reviewed and addressed the Medicare Annual Wellness questionnaire and have noted the following in the patient's chart:  A. Medical and social history B. Use of alcohol, tobacco or illicit  drugs  C. Current medications and supplements D. Functional ability and status E.  Nutritional status F.  Physical activity G. Advance directives H. List of other physicians I.  Hospitalizations, surgeries, and ER visits in previous 12 months J.  [place] to include hearing, vision, cognitive, depression L. Referrals and appointments - none  In addition, I have reviewed and discussed with patient certain preventive protocols, quality metrics, and best practice recommendations. A written personalized care plan for preventive services as well as general preventive health recommendations were provided to patient.  See attached scanned questionnaire for additional information.   Signed,   Lindell Noe, MHA, BS, LPN Health Advisor

## 2016-04-29 NOTE — Patient Instructions (Addendum)
If you are interested in a shingles/zoster vaccine - call your insurance to check on coverage,( you should not get it within 1 month of other vaccines) , then call us for a prescription  for it to take to a pharmacy that gives the shot , or make a nurse visit to get it here depending on your cover Don't forget to schedule your mammogram  Take your calcium and D every day  Stop at check out to plan thyroid ultrasound

## 2016-04-30 LAB — HEPATITIS C ANTIBODY: HCV Ab: NEGATIVE

## 2016-04-30 NOTE — Assessment & Plan Note (Signed)
Lab Results  Component Value Date   HGBA1C 6.1 04/20/2016   Disc borderline DM status  Enc low glycemic diet and exercise to prevent DM

## 2016-04-30 NOTE — Assessment & Plan Note (Addendum)
Reviewed health habits including diet and exercise and skin cancer prevention Reviewed appropriate screening tests for age  Also reviewed health mt list, fam hx and immunization status , as well as social and family history   See HPI Rev AMW from today Labs reviewed  Enc wt loss and exercise Declines dexa If you are interested in a shingles/zoster vaccine - call your insurance to check on coverage,( you should not get it within 1 month of other vaccines) , then call us for a prescription  for it to take to a pharmacy that gives the shot , or make a nurse visit to get it here depending on your cover Don't forget to schedule your mammogram  Take your calcium and D every day  Stop at check out to plan thyroid ultrasound

## 2016-04-30 NOTE — Assessment & Plan Note (Signed)
Discussed how this problem influences overall health and the risks it imposes  Reviewed plan for weight loss with lower calorie diet (via better food choices and also portion control or program like weight watchers) and exercise building up to or more than 30 minutes 5 days per week including some aerobic activity   Pt is not motivated- strongly dislikes physical activity or sweating -doubts she will exercise

## 2016-04-30 NOTE — Assessment & Plan Note (Signed)
Disc goals for lipids and reasons to control them Rev labs with pt Rev low sat fat diet in detail Mildly improved LD

## 2016-04-30 NOTE — Assessment & Plan Note (Signed)
Due for thyroid US re check - ordered No clinical changes

## 2016-04-30 NOTE — Assessment & Plan Note (Addendum)
Pt declines another dexa at this time  Disc need for calcium/ vitamin D/ wt bearing exercise and bone density test every 2 y to monitor Disc safety/ fracture risk in detail   Past hx of fibular fracture

## 2016-05-04 ENCOUNTER — Ambulatory Visit
Admission: RE | Admit: 2016-05-04 | Discharge: 2016-05-04 | Disposition: A | Discharge status: Home / Self Care | Payer: Medicare Other | Source: Ambulatory Visit | Attending: Family Medicine | Admitting: Family Medicine

## 2016-05-04 DIAGNOSIS — E041 Nontoxic single thyroid nodule: Secondary | ICD-10-CM | POA: Insufficient documentation

## 2016-05-05 ENCOUNTER — Other Ambulatory Visit: Payer: Self-pay | Admitting: Family Medicine

## 2016-06-02 ENCOUNTER — Ambulatory Visit: Payer: Medicare Other | Admitting: Certified Nurse Midwife

## 2016-06-23 ENCOUNTER — Other Ambulatory Visit: Payer: Self-pay | Admitting: Family Medicine

## 2016-06-23 DIAGNOSIS — Z1231 Encounter for screening mammogram for malignant neoplasm of breast: Secondary | ICD-10-CM

## 2016-07-25 ENCOUNTER — Ambulatory Visit
Admission: RE | Admit: 2016-07-25 | Discharge: 2016-07-25 | Disposition: A | Discharge status: Home / Self Care | Payer: Medicare Other | Source: Ambulatory Visit | Attending: Family Medicine | Admitting: Family Medicine

## 2016-07-25 DIAGNOSIS — Z1231 Encounter for screening mammogram for malignant neoplasm of breast: Secondary | ICD-10-CM | POA: Insufficient documentation

## 2016-07-25 LAB — HM MAMMOGRAPHY

## 2016-07-25 IMAGING — MG MM DIGITAL SCREENING BILAT W/ TOMO W/ CAD
8 of 12 series · 8 of 28 positions shown · non-contrast
Comparison: Previous exam(s).

CLINICAL DATA: Screening.

EXAM:
2D DIGITAL SCREENING BILATERAL MAMMOGRAM WITH CAD AND ADJUNCT TOMO

[L MLO]
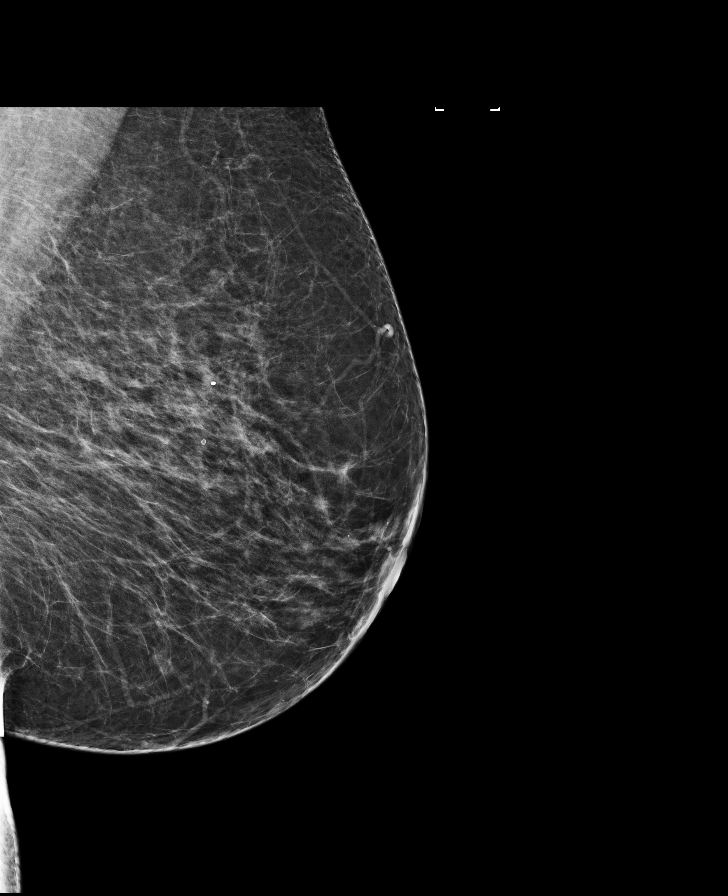

[R MLO synth-2D]
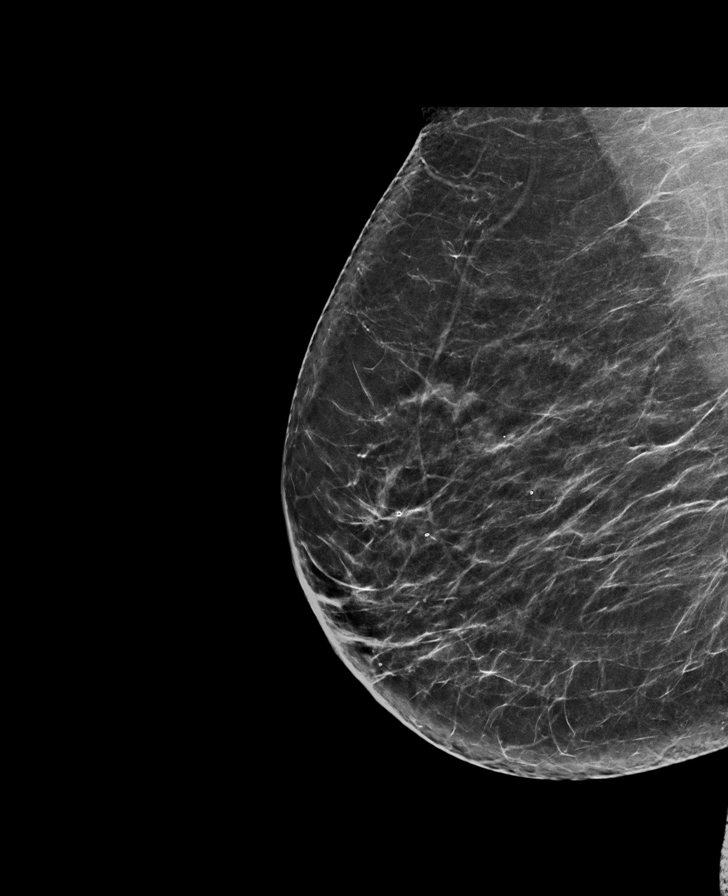

[L MLO synth-2D]
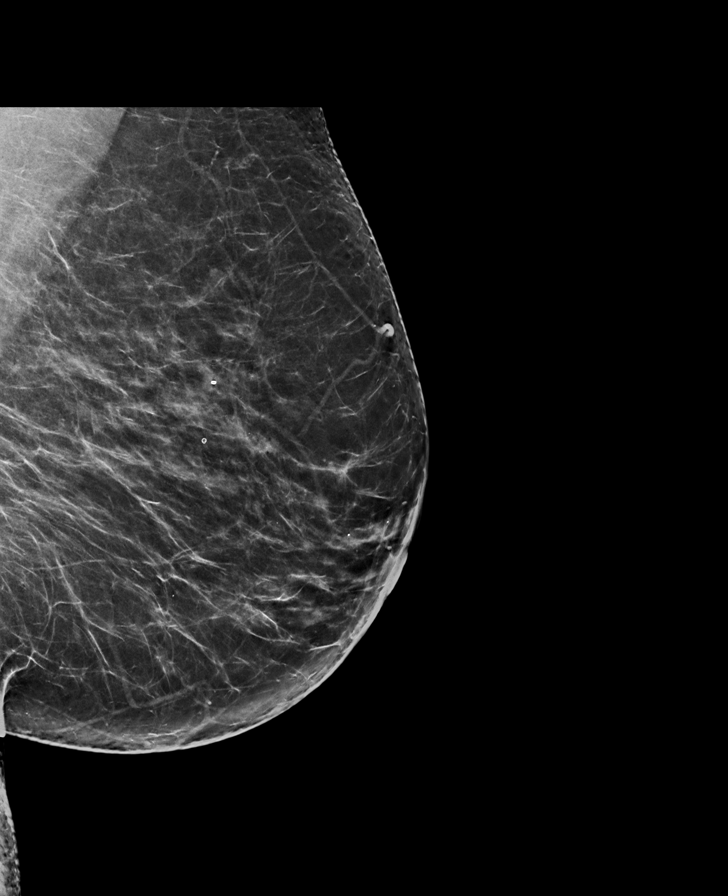

[L CC synth-2D]
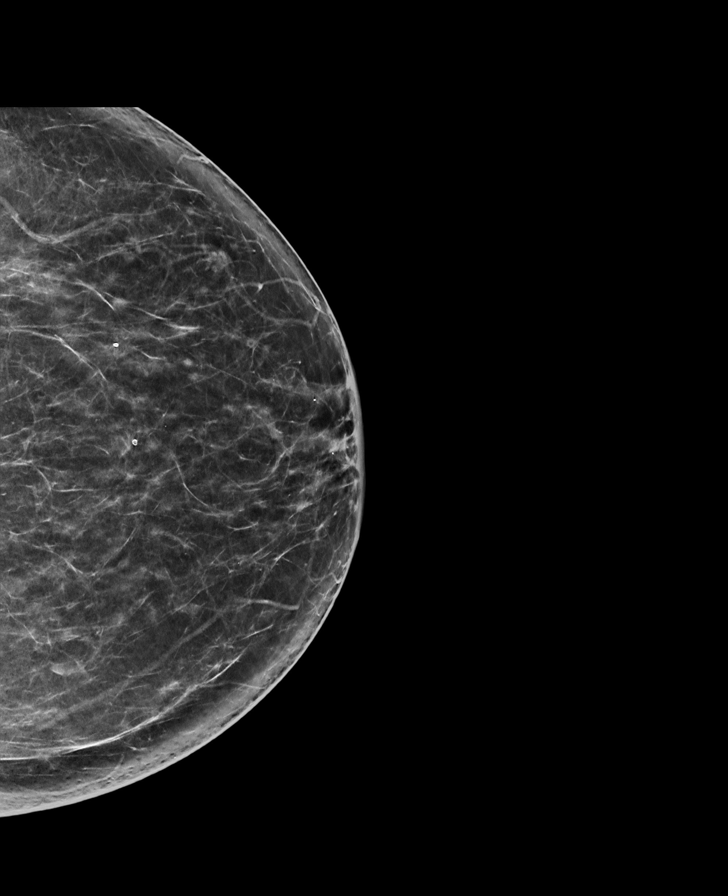

[L CC]
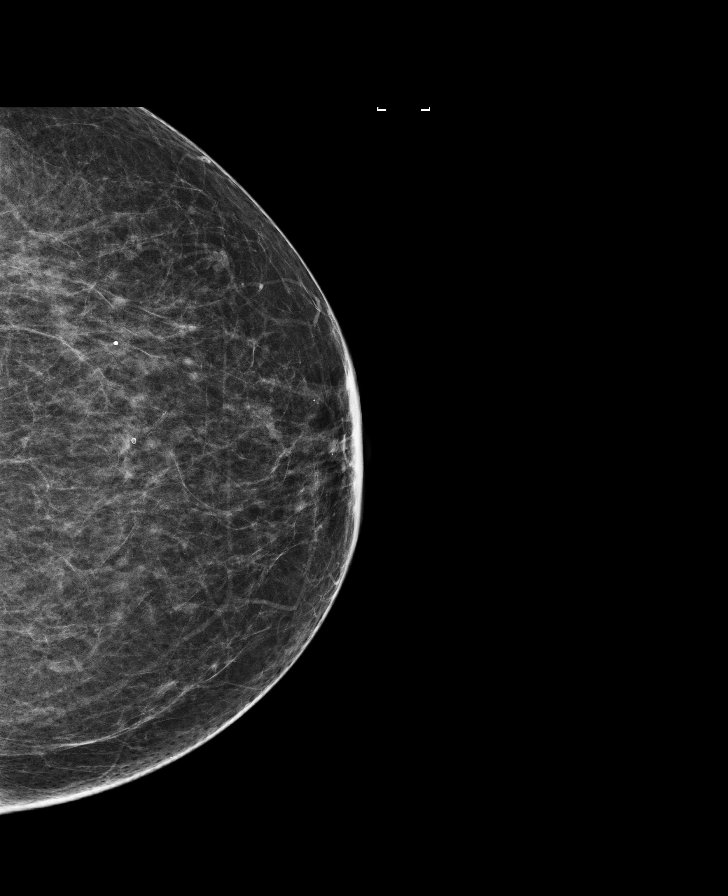

[R MLO]
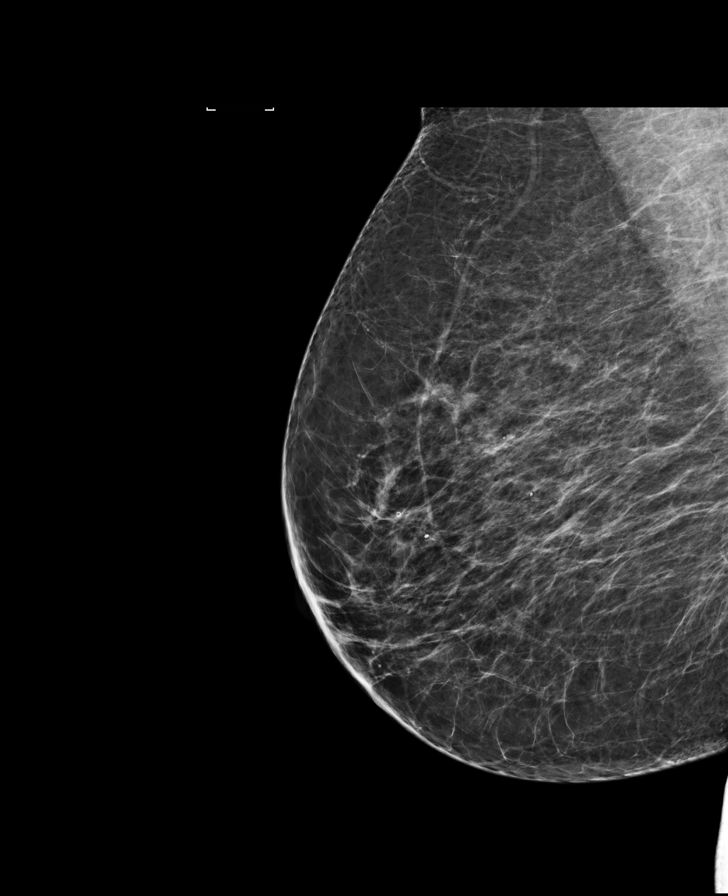

[R CC]
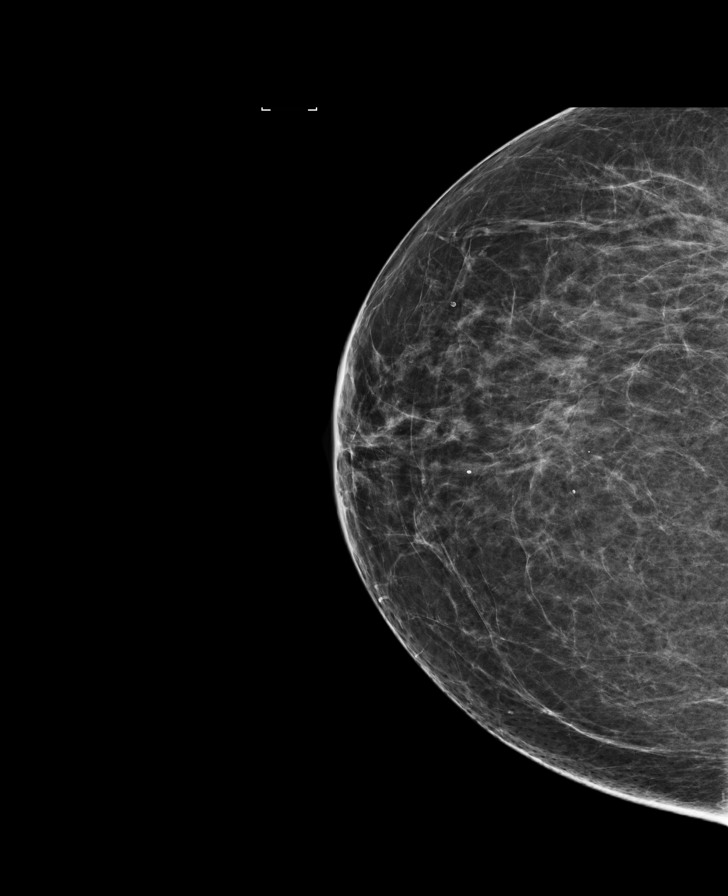

[R CC synth-2D]
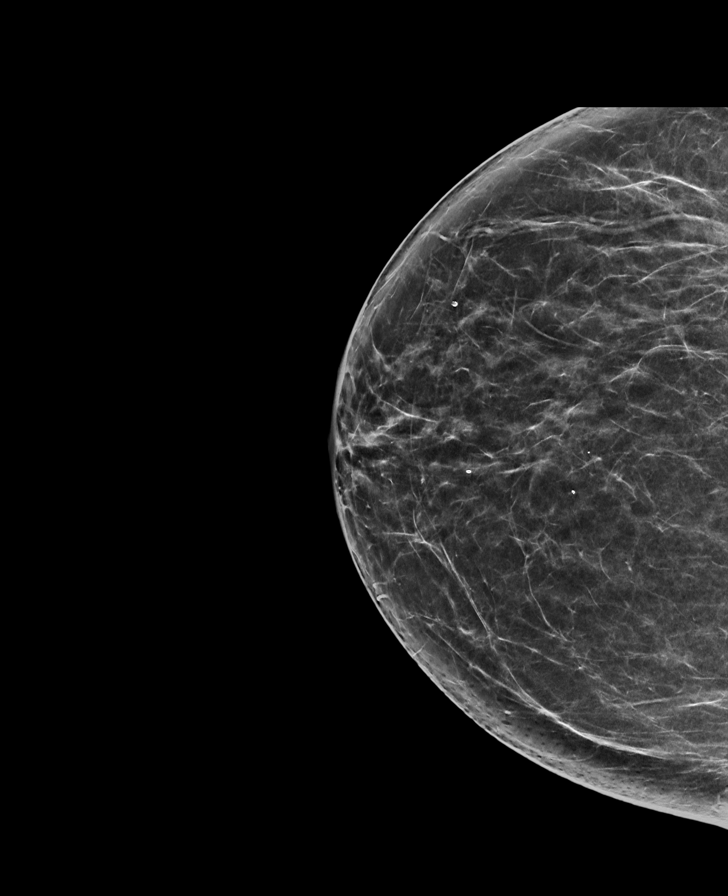

[8 of 28 positions shown; findings below may reference images not displayed]

ACR Breast Density Category c: The breast tissue is heterogeneously
dense, which may obscure small masses.
FINDINGS: There are no findings suspicious for malignancy. Images were
processed with CAD.
IMPRESSION: No mammographic evidence of malignancy. A result letter of this
screening mammogram will be mailed directly to the patient.

RECOMMENDATION:
Screening mammogram in one year. (Code:[TA])

BI-RADS CATEGORY  1: Negative.

## 2016-07-26 ENCOUNTER — Encounter: Payer: Self-pay | Admitting: *Deleted

## 2016-10-05 IMAGING — US US THYROID
1 series · 13 of 25 positions shown · non-contrast
Comparison: [DATE]; [DATE]; [DATE]

CLINICAL DATA: Prior ultrasound follow-up. Follow-up thyroid
nodules

EXAM:
THYROID ULTRASOUND
TECHNIQUE: Ultrasound examination of the thyroid gland and adjacent soft
tissues was performed.

[Series 1: us thyroid · 0.05mm/px · 13 of 39 slices shown]
[im 1/39]
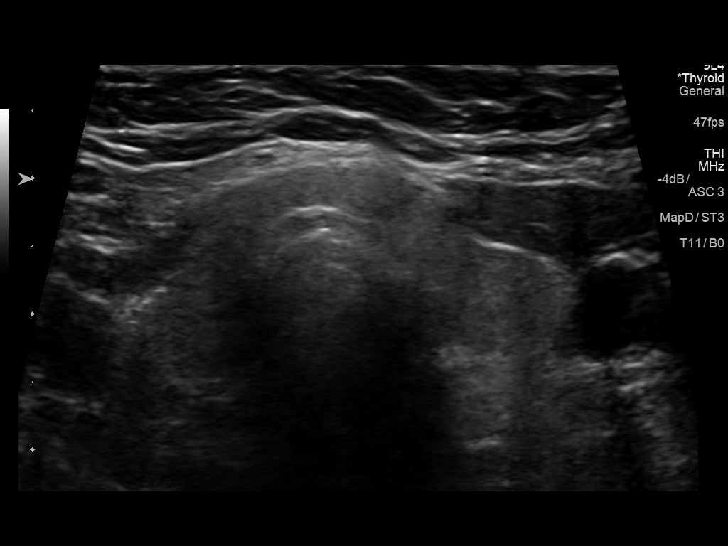
[im 4/39]
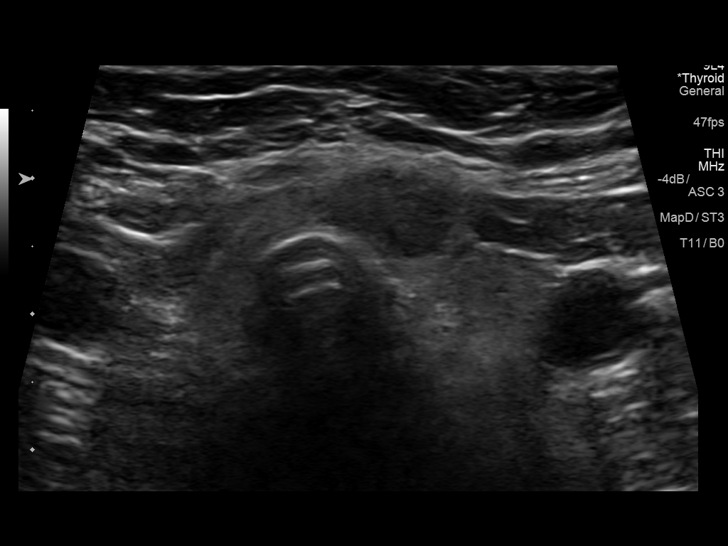
[im 7/39]
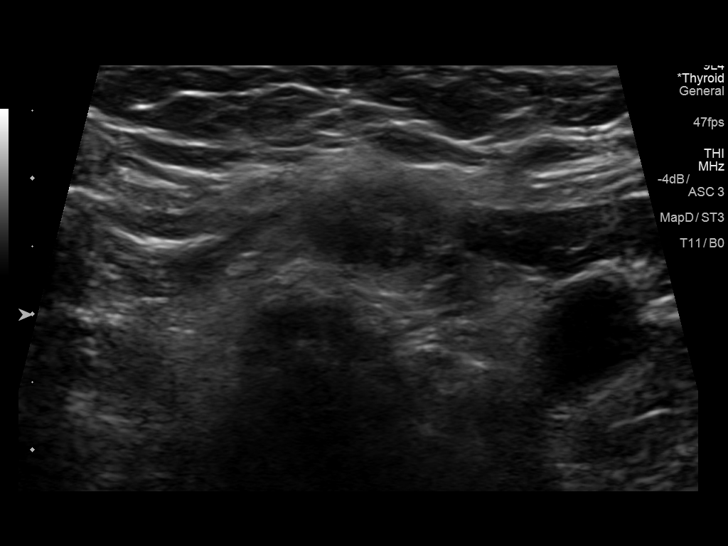
[im 10/39]
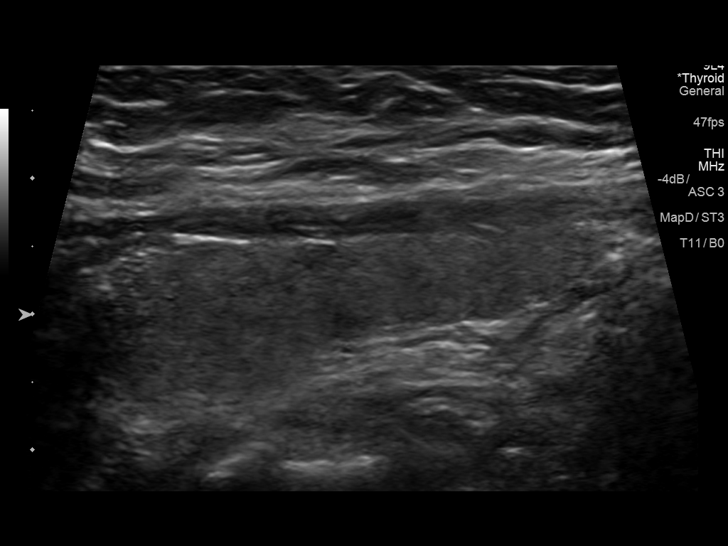
[im 13/39]
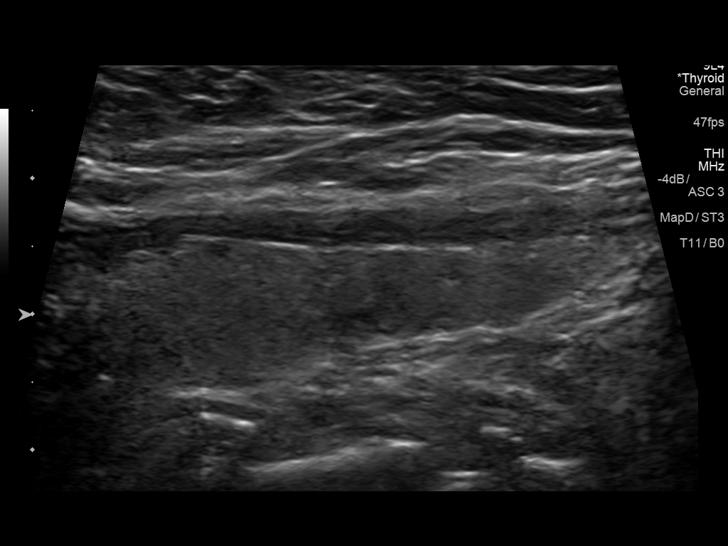
[im 16/39]
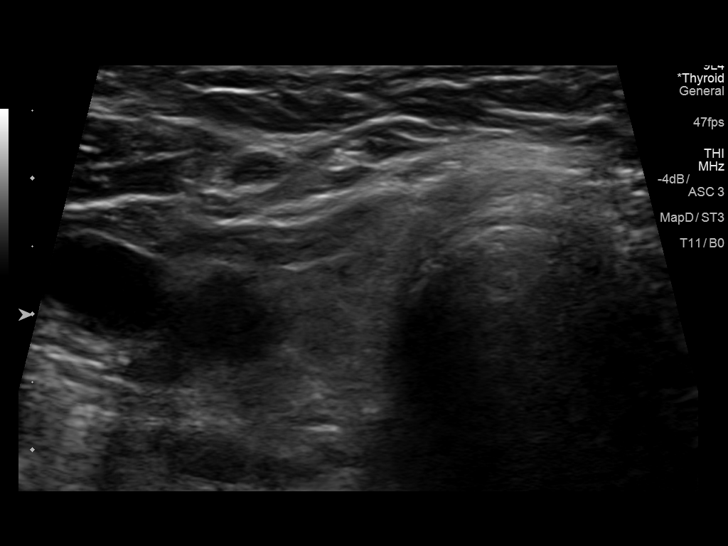
[im 20/39]
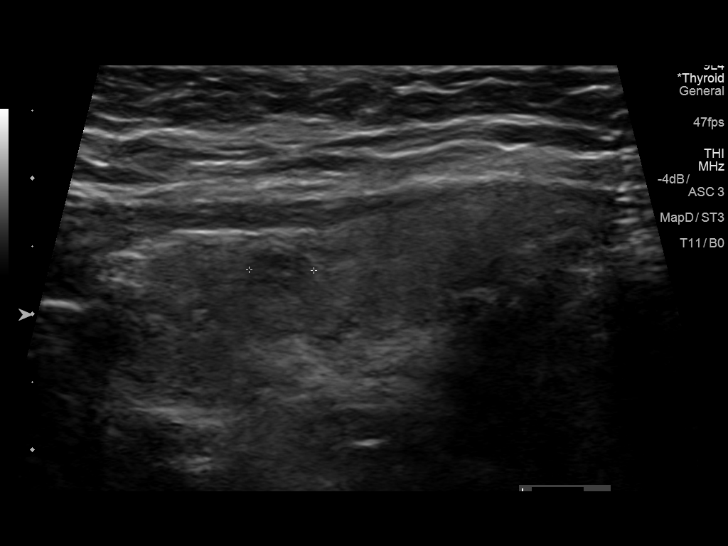
[im 23/39]
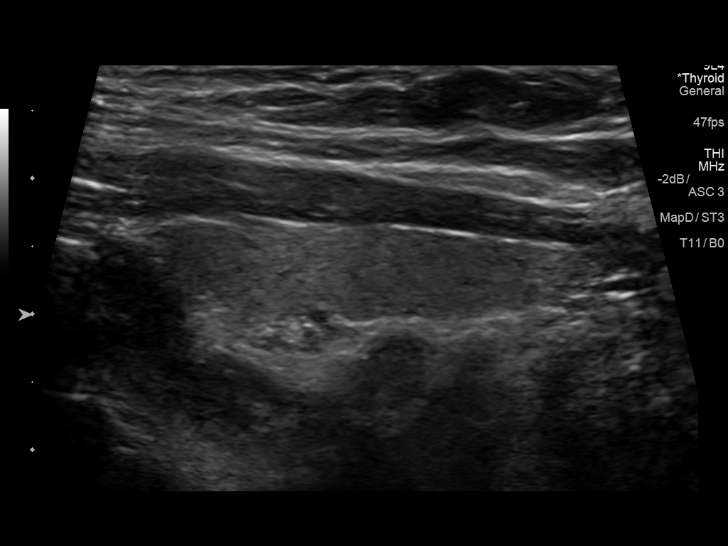
[im 26/39]
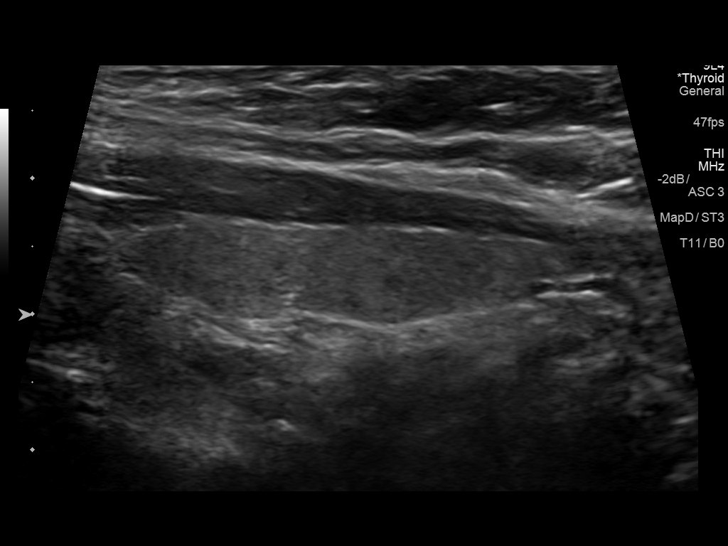
[im 29/39]
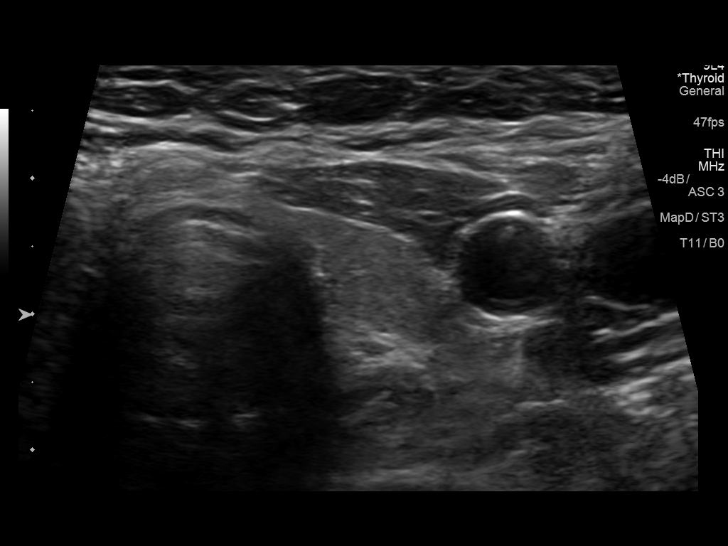
[im 32/39]
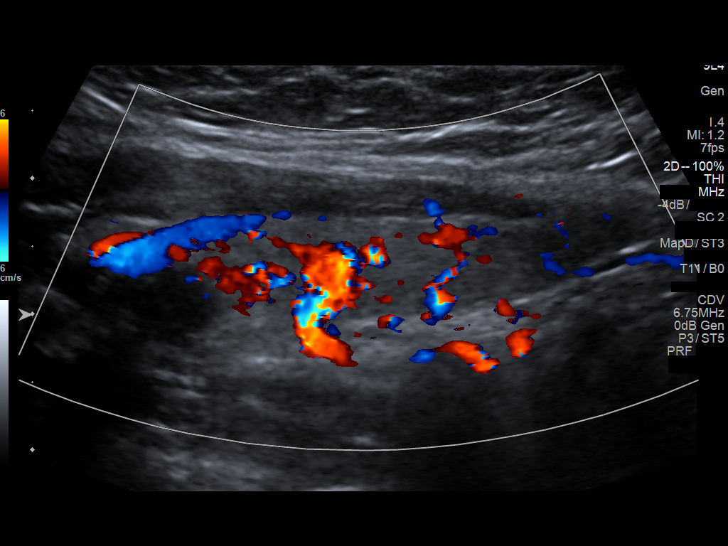
[im 35/39]
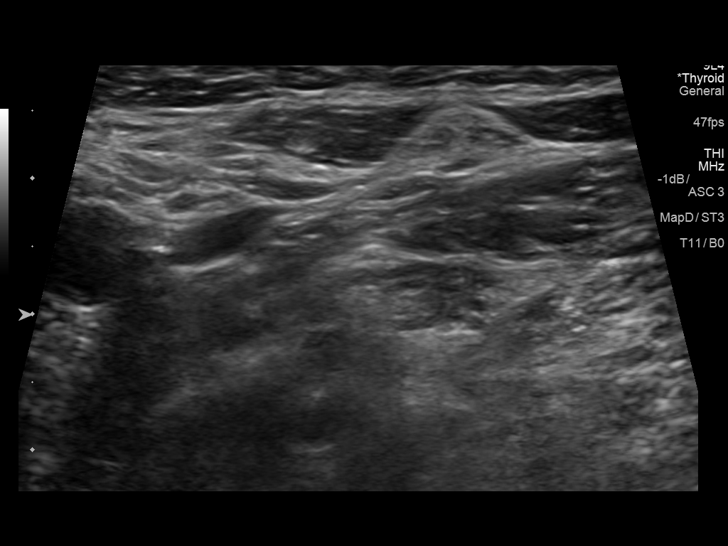
[im 39/39]
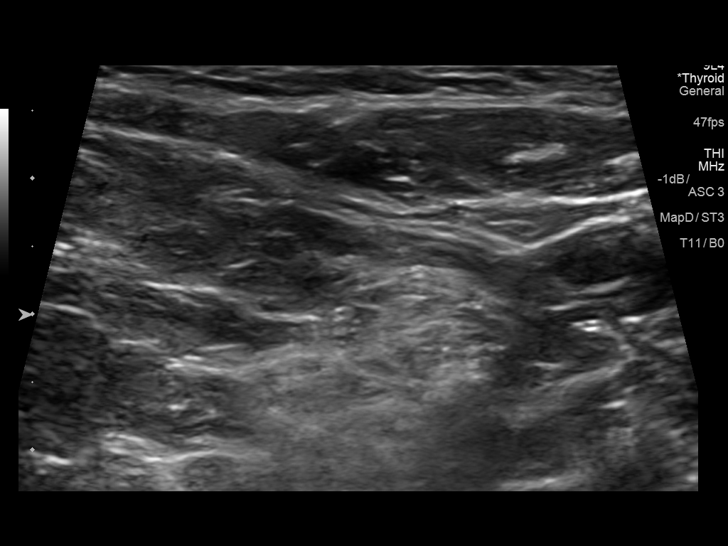

[13 of 25 positions shown; findings below may reference images not displayed]

FINDINGS: Parenchymal Echotexture: Normal

Isthmus: Normal in size measures 0.3 cm in diameter, unchanged

Right lobe: Slightly atrophic measuring 3.8 x 1.0 x 0.8 cm,
unchanged, previously, 3.5 x 1.1 x 1.2 cm

Left lobe: Slightly atrophic measuring 3.3 x 0.7 x 0.9 cm,
unchanged, previously, 3.3 x 0.9 x 1.0 cm

_________________________________________________________

Estimated total number of nodules >/= 1 cm: 1

Number of spongiform nodules >/=  2 cm not described below (TR1): 0

Number of mixed cystic and solid nodules >/= 1.5 cm not described
below (TR2): 0

_________________________________________________________

The approximately 1.2 x 1.1 x 0.6 cm hypoechoic nodule (labeled 1)
within the left side of the thyroid isthmus appears grossly
unchanged compared to the [DATE] examination, previously, 1.0 x
x 0.6 cm. Stability for nearly 5 years is suggestive of a benign
etiology.

_________________________________________________________

There is a punctate (approximately 0.5 cm) hypoechoic nodule within
the superior pole the right lobe of the thyroid which has decreased
in size compared to the [DATE] examination, previously, 0.8 x 0.6 x
0.5 cm. Stability for nearly 5 years is suggestive of a benign
etiology.
IMPRESSION: 1. No new or enlarging thyroid nodules.
2. Both thyroid nodules appear grossly unchanged compared to the
[DATE] examination. Stability for nearly 5 years is indicative of
benign etiology and as such, percutaneous sampling and/or continued
dedicated follow-up is not recommended.

The above is in keeping with the ACR TI-RADS recommendations - [HOSPITAL] [AM];[DATE].

## 2017-02-22 ENCOUNTER — Ambulatory Visit: Payer: Medicare Other | Admitting: Family Medicine

## 2017-02-23 ENCOUNTER — Encounter: Payer: Self-pay | Admitting: Family Medicine

## 2017-02-23 ENCOUNTER — Ambulatory Visit (INDEPENDENT_AMBULATORY_CARE_PROVIDER_SITE_OTHER): Payer: Medicare Other | Admitting: Family Medicine

## 2017-02-23 DIAGNOSIS — R05 Cough: Secondary | ICD-10-CM | POA: Diagnosis not present

## 2017-02-23 DIAGNOSIS — R059 Cough, unspecified: Secondary | ICD-10-CM

## 2017-02-23 MED ORDER — AZITHROMYCIN 250 MG PO TABS: ORAL_TABLET | ORAL | 0 refills | Status: DC

## 2017-02-23 MED ORDER — FLUTICASONE PROPIONATE 50 MCG/ACT NA SUSP: 2.0000 | NASAL | 1 refills | Status: DC | PRN

## 2017-02-23 MED ORDER — ALBUTEROL SULFATE HFA 108 (90 BASE) MCG/ACT IN AERS: 1.0000 | INHALATION_SPRAY | Freq: Four times a day (QID) | RESPIRATORY_TRACT | 1 refills | Status: DC | PRN

## 2017-02-23 NOTE — Patient Instructions (Signed)
Use the inhaler in the meantime.  Start flonase if stuffy.  Hold the antibiotics for now.  You'll likely not need it.  Take care.  Glad to see you.  Update [place] as needed, if worse.

## 2017-02-23 NOTE — Progress Notes (Signed)
Cough.  Likely/possibly  started with allergies, per patient report.  Sick for about 11 days overall.  Stared with eye irritation and sore throat.  No lesions seen in the throat.  Then had cough.  rare sputum.  Chest feels tight.  Some wheeze noted at night by her husband's report.  No FCNAVD.  No facial pain, no ear pain.  Cough comes in fits.    She didn't recall if she had SABA and flonase at home.  Hasn't used either recently.    Meds, vitals, and allergies reviewed.   ROS: Per HPI unless specifically indicated in ROS section   GEN: nad, alert and oriented HEENT: mucous membranes moist, tm w/o erythema, nasal exam w/o erythema, clear discharge noted,  OP with mild cobblestoning, sinuses not ttp NECK: supple w/o LA CV: rrr.   PULM: ctab, no inc wob EXT: no edema

## 2017-02-24 DIAGNOSIS — R059 Cough, unspecified: Secondary | ICD-10-CM | POA: Insufficient documentation

## 2017-02-24 DIAGNOSIS — R05 Cough: Secondary | ICD-10-CM | POA: Insufficient documentation

## 2017-02-24 NOTE — Assessment & Plan Note (Signed)
Likely viral. Discussed with patient. Nontoxic. No sign of pneumonia on exam. Restart albuterol inhaler as needed. Use Flonase if needed for nasal congestion. I gave her prescription for Zithromax to hold. I think she will likely not needed but given duration of cough, should she have significant escalation (more cough, significant increase in sputum, fever) in the next week and start the antibiotics and update [place]. She agrees.

## 2017-05-01 ENCOUNTER — Ambulatory Visit (INDEPENDENT_AMBULATORY_CARE_PROVIDER_SITE_OTHER): Payer: Medicare Other

## 2017-05-01 VITALS — BP 118/76 | HR 83 | Temp 98.0°F | Ht 64.0 in | Wt 209.5 lb

## 2017-05-01 DIAGNOSIS — E784 Other hyperlipidemia: Secondary | ICD-10-CM | POA: Diagnosis not present

## 2017-05-01 DIAGNOSIS — Z0000 Encounter for general adult medical examination without abnormal findings: Secondary | ICD-10-CM | POA: Diagnosis not present

## 2017-05-01 DIAGNOSIS — R739 Hyperglycemia, unspecified: Secondary | ICD-10-CM | POA: Diagnosis not present

## 2017-05-01 DIAGNOSIS — E041 Nontoxic single thyroid nodule: Secondary | ICD-10-CM

## 2017-05-01 DIAGNOSIS — E7849 Other hyperlipidemia: Secondary | ICD-10-CM

## 2017-05-01 LAB — CBC WITH DIFFERENTIAL/PLATELET
BASOS PCT: 0.8 % (ref 0.0–3.0)
Basophils Absolute: 0.1 10*3/uL (ref 0.0–0.1)
EOS ABS: 0.1 10*3/uL (ref 0.0–0.7)
EOS PCT: 1.8 % (ref 0.0–5.0)
HCT: 43.6 % (ref 36.0–46.0)
Hemoglobin: 14.6 g/dL (ref 12.0–15.0)
LYMPHS ABS: 1.9 10*3/uL (ref 0.7–4.0)
Lymphocytes Relative: 25.9 % (ref 12.0–46.0)
MCHC: 33.4 g/dL (ref 30.0–36.0)
MCV: 95.9 fl (ref 78.0–100.0)
MONO ABS: 0.4 10*3/uL (ref 0.1–1.0)
Monocytes Relative: 6.2 % (ref 3.0–12.0)
NEUTROS ABS: 4.7 10*3/uL (ref 1.4–7.7)
Neutrophils Relative %: 65.3 % (ref 43.0–77.0)
PLATELETS: 275 10*3/uL (ref 150.0–400.0)
RBC: 4.55 Mil/uL (ref 3.87–5.11)
RDW: 13.2 % (ref 11.5–15.5)
WBC: 7.2 10*3/uL (ref 4.0–10.5)

## 2017-05-01 LAB — LIPID PANEL
CHOL/HDL RATIO: 4
CHOLESTEROL: 190 mg/dL (ref 0–200)
HDL: 43.1 mg/dL (ref >39.00)
LDL CALC: 122 mg/dL — AB (ref 0–99)
NonHDL: 146.85
TRIGLYCERIDES: 124 mg/dL (ref 0.0–149.0)
VLDL: 24.8 mg/dL (ref 0.0–40.0)

## 2017-05-01 LAB — TSH: TSH: 1.97 u[IU]/mL (ref 0.35–4.50)

## 2017-05-01 LAB — COMPREHENSIVE METABOLIC PANEL
ALBUMIN: 4.3 g/dL (ref 3.5–5.2)
ALT: 13 U/L (ref 0–35)
AST: 14 U/L (ref 0–37)
Alkaline Phosphatase: 73 U/L (ref 39–117)
BUN: 14 mg/dL (ref 6–23)
CHLORIDE: 102 meq/L (ref 96–112)
CO2: 32 meq/L (ref 19–32)
CREATININE: 0.82 mg/dL (ref 0.40–1.20)
Calcium: 9.8 mg/dL (ref 8.4–10.5)
GFR: 73.54 mL/min (ref >60.00)
Glucose, Bld: 124 mg/dL — ABNORMAL HIGH (ref 70–99)
POTASSIUM: 4.7 meq/L (ref 3.5–5.1)
SODIUM: 138 meq/L (ref 135–145)
Total Bilirubin: 0.6 mg/dL (ref 0.2–1.2)
Total Protein: 7.3 g/dL (ref 6.0–8.3)

## 2017-05-01 LAB — HEMOGLOBIN A1C: HEMOGLOBIN A1C: 6.1 % (ref 4.6–6.5)

## 2017-05-01 NOTE — Progress Notes (Signed)
PCP notes:   Health maintenance:  Flu vaccine - addressed  Abnormal screenings:   Depression score: 4  Patient concerns:   None  Nurse concerns:  None  Next PCP appt:   05/05/17 @ 1130  I reviewed health advisor's note, was available for consultation, and agree with documentation and plan. Loura Pardon MD

## 2017-05-01 NOTE — Progress Notes (Signed)
Subjective:   Deborah Carey is a 66 y.o. female who presents for Medicare Annual (Subsequent) preventive examination.  Review of Systems:  N/A Cardiac Risk Factors include: advanced age (>1men, >95 women);dyslipidemia;obesity (BMI >30kg/m2)     Objective:     Vitals: BP 118/76 (BP Location: Right Arm, Patient Position: Sitting, Cuff Size: Normal)   Pulse 83   Temp 98 F (36.7 C) (Oral)   Ht 5\' 4"  (1.626 m) Comment: no shoes  Wt 209 lb 8 oz (95 kg)   LMP 09/05/2008   SpO2 98%   BMI 35.96 kg/m   Body mass index is 35.96 kg/m.   Tobacco History  Smoking Status  . Never Smoker  Smokeless Tobacco  . Never Used     Counseling given: No   Past Medical History:  Diagnosis Date  . Allergy    allergic rhinitis  . Anxiety   . Infertility, female   . Osteoarthritis of both knees   . Post-menopausal bleeding    neg endo bx.   Past Surgical History:  Procedure Laterality Date  . cataract surgery  2011  . CESAREAN SECTION     times 2  . ENDOMETRIAL BIOPSY     neg for CA cells  . FACIAL COSMETIC SURGERY  2006   face lift  . FOOT SURGERY  1998   rt heel spur removal  . REFRACTIVE SURGERY  08/1999   Family History  Problem Relation Age of Onset  . COPD Brother   . Stroke Mother   . Hypertension Mother   . Osteoporosis Mother   . Alzheimer's disease Father   . Hypertension Father   . Osteoporosis Father   . Depression Sister   . Hypertension Sister   . Osteoporosis Sister   . Thyroid disease Sister   . Parkinson's disease Sister   . Osteoporosis Sister   . Breast cancer Neg Hx    History  Sexual Activity  . Sexual activity: Yes  . Partners: Male  . Birth control/ protection: Post-menopausal    Outpatient Encounter Prescriptions as of 05/01/2017  Medication Sig  . albuterol (PROVENTIL HFA;VENTOLIN HFA) 108 (90 Base) MCG/ACT inhaler Inhale 1-2 puffs into the lungs every 6 (six) hours as needed for wheezing (or for cough).  . CALCIUM PO Take by mouth  as needed.   . cetirizine (ZYRTEC) 10 MG tablet Take 10 mg by mouth daily.  Marland Kitchen FLUoxetine (PROZAC) 40 MG capsule Take 1 capsule (40 mg total) by mouth daily.  . fluticasone (FLONASE) 50 MCG/ACT nasal spray Place 2 sprays into both nostrils as needed.  . Multiple Vitamins-Minerals (MULTIVITAMIN PO) Take by mouth as needed.   . naproxen sodium (ANAPROX) 220 MG tablet Take 220 mg by mouth as needed.  . Omega-3 Fatty Acids (FISH OIL PO) Take by mouth as needed.   . [DISCONTINUED] azithromycin (ZITHROMAX) 250 MG tablet 2 tabs a day for 1 day and then 1 a day for 4 days.   No facility-administered encounter medications on file as of 05/01/2017.     Activities of Daily Living In your present state of health, do you have any difficulty performing the following activities: 05/01/2017  Hearing? N  Vision? Y  Difficulty concentrating or making decisions? N  Walking or climbing stairs? Y  Comment knee pain  Dressing or bathing? N  Doing errands, shopping? N  Preparing Food and eating ? N  Using the Toilet? N  In the past six months, have you accidently leaked urine?  N  Do you have problems with loss of bowel control? N  Managing your Medications? N  Managing your Finances? N  Housekeeping or managing your Housekeeping? N  Some recent data might be hidden    Patient Care Team: Tower, Wynelle Fanny, MD as PCP - General Hollice Espy Phylis Bougie, CNM as Referring Physician (Certified Nurse Midwife) Conception Chancy, DDS as Referring Physician (Dentistry)    Assessment:     Hearing Screening   125Hz  250Hz  500Hz  1000Hz  2000Hz  3000Hz  4000Hz  6000Hz  8000Hz   Right ear:   40 40 40  40    Left ear:   40 40 40  40      Visual Acuity Screening   Right eye Left eye Both eyes  Without correction: 20/70-1 20/20 20/20-1  With correction:       Exercise Activities and Dietary recommendations Current Exercise Habits: The patient does not participate in regular exercise at present, Exercise limited by: None  identified  Goals    . Increase physical activity          In Fall 2018, I will resume Silver Sneakers program at least 60 min twice weekly.       Fall Risk Fall Risk  05/01/2017 04/29/2016 04/28/2015 11/29/2013  Falls in the past year? No No Yes No  Number falls in past yr: - - 1 -  Risk for fall due to : - - Impaired balance/gait -   Depression Screen PHQ 2/9 Scores 05/01/2017 04/29/2016 04/28/2015 11/29/2013  PHQ - 2 Score 0 1 0 0  PHQ- 9 Score 4 - - -     Cognitive Function MMSE - Mini Mental State Exam 05/01/2017 04/29/2016  Orientation to time 5 5  Orientation to Place 5 5  Registration 3 3  Attention/ Calculation 0 0  Recall 3 3  Language- name 2 objects 0 0  Language- repeat 1 1  Language- follow 3 step command 3 3  Language- read & follow direction 0 0  Write a sentence 0 0  Copy design 0 0  Total score 20 20       PLEASE NOTE: A Mini-Cog screen was completed. Maximum score is 20. A value of 0 denotes this part of Folstein MMSE was not completed or the patient failed this part of the Mini-Cog screening.   Mini-Cog Screening Orientation to Time - Max 5 pts Orientation to Place - Max 5 pts Registration - Max 3 pts Recall - Max 3 pts Language Repeat - Max 1 pts Language Follow 3 Step Command - Max 3 pts   Immunization History  Administered Date(s) Administered  . Influenza Split 09/27/2011  . Influenza,inj,Quad PF,6+ Mos 09/09/2015, 04/29/2016  . Pneumococcal Conjugate-13 04/28/2015  . Pneumococcal Polysaccharide-23 11/29/2013  . Td 01/21/2003  . Tdap 11/05/2012   Screening Tests Health Maintenance  Topic Date Due  . INFLUENZA VACCINE  12/03/2017 (Originally 04/05/2017)  . MAMMOGRAM  07/25/2017  . COLONOSCOPY  10/06/2018  . TETANUS/TDAP  11/06/2022  . DEXA SCAN  Completed  . Hepatitis C Screening  Completed  . PNA vac Low Risk Adult  Completed      Plan:     I have personally reviewed and addressed the Medicare Annual Wellness questionnaire and have  noted the following in the patient's chart:  A. Medical and social history B. Use of alcohol, tobacco or illicit drugs  C. Current medications and supplements D. Functional ability and status E.  Nutritional status F.  Physical activity G. Advance directives  H. List of other physicians I.  Hospitalizations, surgeries, and ER visits in previous 12 months J.  [place] to include hearing, vision, cognitive, depression L. Referrals and appointments - none  In addition, I have reviewed and discussed with patient certain preventive protocols, quality metrics, and best practice recommendations. A written personalized care plan for preventive services as well as general preventive health recommendations were provided to patient.  See attached scanned questionnaire for additional information.   Signed,   Lindell Noe, MHA, BS, LPN Health Coach

## 2017-05-01 NOTE — Progress Notes (Signed)
Pre visit review using our clinic review tool, if applicable. No additional management support is needed unless otherwise documented below in the visit note. 

## 2017-05-01 NOTE — Patient Instructions (Addendum)
Deborah Carey , Thank you for taking time to come for your Medicare Wellness Visit. I appreciate your ongoing commitment to your health goals. Please review the following plan we discussed and let me know if I can assist you in the future.   These are the goals we discussed: Goals    . Increase physical activity          In Fall 2018, I will resume Silver Sneakers program at least 60 min twice weekly.        This is a list of the screening recommended for you and due dates:  Health Maintenance  Topic Date Due  . Flu Shot  12/03/2017*  . Mammogram  07/25/2017  . Colon Cancer Screening  10/06/2018  . Tetanus Vaccine  11/06/2022  . DEXA scan (bone density measurement)  Completed  .  Hepatitis C: One time screening is recommended by Center for Disease Control  (CDC) for  adults born from [birth year] through 1967.   Completed  . Pneumonia vaccines  Completed  *Topic was postponed. The date shown is not the original due date.   Preventive Care for Adults  A healthy lifestyle and preventive care can promote health and wellness. Preventive health guidelines for adults include the following key practices.  . A routine yearly physical is a good way to check with your health care provider about your health and preventive screening. It is a chance to share any concerns and updates on your health and to receive a thorough exam.  . Visit your dentist for a routine exam and preventive care every 6 months. Brush your teeth twice a day and floss once a day. Good oral hygiene prevents tooth decay and gum disease.  . The frequency of eye exams is based on your age, health, family medical history, use  of contact lenses, and other factors. Follow your health care provider's ecommendations for frequency of eye exams.  . Eat a healthy diet. Foods like vegetables, fruits, whole grains, low-fat dairy products, and lean protein foods contain the nutrients you need without too many calories. Decrease your intake of  foods high in solid fats, added sugars, and salt. Eat the right amount of calories for you. Get information about a proper diet from your health care provider, if necessary.  . Regular physical exercise is one of the most important things you can do for your health. Most adults should get at least 150 minutes of moderate-intensity exercise (any activity that increases your heart rate and causes you to sweat) each week. In addition, most adults need muscle-strengthening exercises on 2 or more days a week.  Silver Sneakers may be a benefit available to you. To determine eligibility, you may visit the website: www.silversneakers.com or contact program at (919)450-2468 Mon-Fri between 8AM-8PM.   . Maintain a healthy weight. The body mass index (BMI) is a screening tool to identify possible weight problems. It provides an estimate of body fat based on height and weight. Your health care provider can find your BMI and can help you achieve or maintain a healthy weight.   For adults 20 years and older: ? A BMI below 18.5 is considered underweight. ? A BMI of 18.5 to 24.9 is normal. ? A BMI of 25 to 29.9 is considered overweight. ? A BMI of 30 and above is considered obese.   . Maintain normal blood lipids and cholesterol levels by exercising and minimizing your intake of saturated fat. Eat a balanced diet with plenty of fruit  and vegetables. Blood tests for lipids and cholesterol should begin at age 10 and be repeated every 5 years. If your lipid or cholesterol levels are high, you are over 50, or you are at high risk for heart disease, you may need your cholesterol levels checked more frequently. Ongoing high lipid and cholesterol levels should be treated with medicines if diet and exercise are not working.  . If you smoke, find out from your health care provider how to quit. If you do not use tobacco, please do not start.  . If you choose to drink alcohol, please do not consume more than 2 drinks per  day. One drink is considered to be 12 ounces (355 mL) of beer, 5 ounces (148 mL) of wine, or 1.5 ounces (44 mL) of liquor.  . If you are 77-36 years old, ask your health care provider if you should take aspirin to prevent strokes.  . Use sunscreen. Apply sunscreen liberally and repeatedly throughout the day. You should seek shade when your shadow is shorter than you. Protect yourself by wearing long sleeves, pants, a wide-brimmed hat, and sunglasses year round, whenever you are outdoors.  . Once a month, do a whole body skin exam, using a mirror to look at the skin on your back. Tell your health care provider of new moles, moles that have irregular borders, moles that are larger than a pencil eraser, or moles that have changed in shape or color.

## 2017-05-03 ENCOUNTER — Ambulatory Visit: Payer: Medicare Other

## 2017-05-05 ENCOUNTER — Ambulatory Visit (INDEPENDENT_AMBULATORY_CARE_PROVIDER_SITE_OTHER): Payer: Medicare Other | Admitting: Family Medicine

## 2017-05-05 ENCOUNTER — Encounter: Payer: Self-pay | Admitting: Family Medicine

## 2017-05-05 VITALS — BP 128/72 | HR 92 | Temp 98.2°F | Ht 64.0 in | Wt 214.0 lb

## 2017-05-05 DIAGNOSIS — E2839 Other primary ovarian failure: Secondary | ICD-10-CM

## 2017-05-05 DIAGNOSIS — Z0000 Encounter for general adult medical examination without abnormal findings: Secondary | ICD-10-CM | POA: Diagnosis not present

## 2017-05-05 DIAGNOSIS — E7800 Pure hypercholesterolemia, unspecified: Secondary | ICD-10-CM | POA: Diagnosis not present

## 2017-05-05 DIAGNOSIS — M8580 Other specified disorders of bone density and structure, unspecified site: Secondary | ICD-10-CM

## 2017-05-05 DIAGNOSIS — Z6836 Body mass index (BMI) 36.0-36.9, adult: Secondary | ICD-10-CM

## 2017-05-05 DIAGNOSIS — E041 Nontoxic single thyroid nodule: Secondary | ICD-10-CM | POA: Diagnosis not present

## 2017-05-05 DIAGNOSIS — R739 Hyperglycemia, unspecified: Secondary | ICD-10-CM

## 2017-05-05 MED ORDER — FLUOXETINE HCL 40 MG PO CAPS: 40.0000 mg | ORAL_CAPSULE | Freq: Every day | ORAL | 3 refills | Status: DC

## 2017-05-05 NOTE — Patient Instructions (Addendum)
In a perfect world you would exercise 30 or more minutes per day  Swimming is a good choice  Check into an aquatic center   Your mammogram is due in November- don't forget to schedule   Don't forget to get your flu shot in the fall   We will refer you for thyroid ultrasound and dexa   Your cholesterol is up -please restrict red meat to once monthly as well as fried foods   (if that is what is cooked-you should eat separately)  Avoid red meat/ fried foods/ egg yolks/ fatty breakfast meats/ butter, cheese and high fat dairy/ and shellfish

## 2017-05-05 NOTE — Progress Notes (Signed)
Subjective:    Patient ID: Deborah Carey, female    DOB: 06-01-1951, 66 y.o.   MRN: [id number]  HPI Here for health maintenance exam and to review chronic medical problems   Good summer  [place]   Taking fair care of herself  Swims a  for exercise in the summer   Had amw 8/27 No gaps  Depression score was 4 (PQ9) She has been / "a little down" but denies depression  Gained her weight / lost it with wt watchers and then gained some again    Wt Readings from Last 3 Encounters:  05/05/17 214 lb (97.1 kg)  05/01/17 209 lb 8 oz (95 kg)  02/23/17 214 lb 4 oz (97.2 kg)  not sure she will go back to weight watchers  Meetings help   36.73 kg/m   She dislikes exercise aside from swimming  Goes on and off to silver sneakers   Mammogram 11/17 -normal Self breast exam -no lumps   Gyn care -visit last summer /no problems    Flu shot - will get in the fall   Colonoscopy 2/10 normal (Dr Gustavo Lah)  dexa 4/15- very mild osteopenia  Fibular fracture in the past  Strong fam hx of OP  She takes ca and D  Exercise is swimming    Hx of thyroid nodule   US Soft Tissue Head/Neck (Accession [id number]) (Order [id number])  Imaging  Date: 05/04/2016 Department: [place] Released By: Jodene Nam D Authorizing: Nonie Lochner, Wynelle Fanny, MD  Exam Information   Status Exam Begun  Exam Ended   Final [99] 05/04/2016 3:52 PM 05/04/2016 4:25 PM  PACS Images   Show images for US Soft Tissue Head/Neck  Study Result   CLINICAL DATA:  History of 1 cm left-sided thyroid isthmus nodule.  EXAM: THYROID ULTRASOUND  TECHNIQUE: Ultrasound examination of the thyroid gland and adjacent soft tissues was performed.  COMPARISON:  None.  FINDINGS: Parenchymal Echotexture: Normal  Estimated total number of nodules > 1 cm: <5  Number of spongiform nodules > 2 cm not described below (TR1): 0  Number of mixed cystic nodules >  1.5 cm not described below (TR2): 0  _________________________________________________________  Isthmus: 0.3 cm in thickness in the midline  Nodule # 1:  Prior biopsy: No  Location: Isthmus; left  Size: 1.1 x 0.8 x 1.0 cm  Composition: solid/almost completely solid (2)  Echogenicity: hypoechoic (2)  Shape: not taller-than-wide (0)  Margins: smooth (0)  Echogenic foci: none (0)  ACR TI-RADS total points: 4.  ACR TI-RADS risk category: TR4 (4-6 points).  Change in features: No  Change in ACR TI-RADS risk category: No  ACR TI-RADS recommendations:  *Given size (1 - 1.5 cm) and appearance, a follow-up ultrasound in 1 year is recommended based on TI-RADS criteria as clinically indicated.  _________________________________________________________  Right lobe: 3.7 x 1.0 x 1.5 cm (stable size)  No discrete nodules are identified within the right lobe of the thyroid.  _________________________________________________________  Left lobe: 3.0 x 0.9 x 0.9 cm (stable size)  No discrete nodules are identified within the left lobe of the thyroid.  IMPRESSION: Stable left-sided isthmus nodule. Based on nodule characteristics and size, ultrasound follow-up in 1 year is recommended, as above.  The above is in keeping with the ACR TI-RADS recommendations - J Am Coll Radiol 2017;14:587-595.   Stable L sided isthmus nodule - recommended [place] f/u 1 y   Lab Results  Component Value Date   TSH 1.97 05/01/2017    Hyperlipidemia Lab Results  Component Value Date   CHOL 190 05/01/2017   CHOL 189 04/20/2016   CHOL 199 04/20/2015   Lab Results  Component Value Date   HDL 43.10 05/01/2017   HDL 47.30 04/20/2016   HDL 48.30 04/20/2015   Lab Results  Component Value Date   LDLCALC 122 (H) 05/01/2017   LDLCALC 116 (H) 04/20/2016   LDLCALC 130 (H) 04/20/2015   Lab Results  Component Value Date   TRIG 124.0 05/01/2017   TRIG 128.0 04/20/2016     TRIG 105.0 04/20/2015   Lab Results  Component Value Date   CHOLHDL 4 05/01/2017   CHOLHDL 4 04/20/2016   CHOLHDL 4 04/20/2015   Lab Results  Component Value Date   LDLDIRECT 141.8 10/30/2013  cholesterol - fair control  Some red meat because husband likes to cook it    Hyperglycemia Lab Results  Component Value Date   HGBA1C 6.1 05/01/2017   This is stable from a year ago  Watching carbs with weight watchers   Lab Results  Component Value Date   WBC 7.2 05/01/2017   HGB 14.6 05/01/2017   HCT 43.6 05/01/2017   MCV 95.9 05/01/2017   PLT 275.0 05/01/2017   Lab Results  Component Value Date   CREATININE 0.82 05/01/2017   BUN 14 05/01/2017   NA 138 05/01/2017   K 4.7 05/01/2017   CL 102 05/01/2017   CO2 32 05/01/2017   Lab Results  Component Value Date   ALT 13 05/01/2017   AST 14 05/01/2017   ALKPHOS 73 05/01/2017   BILITOT 0.6 05/01/2017    Patient Active Problem List   Diagnosis Date Noted  . Cough 02/24/2017  . H/O fracture of fibula 01/21/2014  . Osteopenia 01/07/2014  . Family history of osteoporosis 11/29/2013  . Estrogen deficiency 11/29/2013  . Hyperlipidemia 11/29/2013  . Encounter for Medicare annual wellness exam 10/29/2013  . Irregular heartbeat 10/07/2013  . Left thyroid nodule 09/26/2013  . Enlarged thyroid 09/16/2013  . Primary osteoarthritis of both knees 11/05/2012  . Other screening mammogram 09/27/2011  . Routine general medical examination at a health care facility 09/19/2011  . Obesity 06/06/2008  . Hyperglycemia 06/06/2008  . ANXIETY 04/17/2007  . ALLERGIC RHINITIS 04/17/2007   Past Medical History:  Diagnosis Date  . Allergy    allergic rhinitis  . Anxiety   . Infertility, female   . Osteoarthritis of both knees   . Post-menopausal bleeding    neg endo bx.   Past Surgical History:  Procedure Laterality Date  . cataract surgery  2011  . CESAREAN SECTION     times 2  . ENDOMETRIAL BIOPSY     neg for CA cells  .  FACIAL COSMETIC SURGERY  2006   face lift  . FOOT SURGERY  1998   rt heel spur removal  . REFRACTIVE SURGERY  08/1999   Social History  Substance Use Topics  . Smoking status: Never Smoker  . Smokeless tobacco: Never Used  . Alcohol use No   Family History  Problem Relation Age of Onset  . COPD Brother   . Stroke Mother   . Hypertension Mother   . Osteoporosis Mother   . Alzheimer's disease Father   . Hypertension Father   . Osteoporosis Father   . Depression Sister   . Hypertension Sister   . Osteoporosis Sister   . Thyroid disease Sister   .  Parkinson's disease Sister   . Osteoporosis Sister   . Breast cancer Neg Hx    No Known Allergies Current Outpatient Prescriptions on File Prior to Visit  Medication Sig Dispense Refill  . albuterol (PROVENTIL HFA;VENTOLIN HFA) 108 (90 Base) MCG/ACT inhaler Inhale 1-2 puffs into the lungs every 6 (six) hours as needed for wheezing (or for cough). 1 Inhaler 1  . CALCIUM PO Take by mouth as needed.     . cetirizine (ZYRTEC) 10 MG tablet Take 10 mg by mouth daily.    . fluticasone (FLONASE) 50 MCG/ACT nasal spray Place 2 sprays into both nostrils as needed. 16 g 1  . Multiple Vitamins-Minerals (MULTIVITAMIN PO) Take by mouth as needed.     . naproxen sodium (ANAPROX) 220 MG tablet Take 220 mg by mouth as needed.    . Omega-3 Fatty Acids (FISH OIL PO) Take by mouth as needed.      No current facility-administered medications on file prior to visit.     Review of Systems    Review of Systems  Constitutional: Negative for fever, appetite change, fatigue and unexpected weight change.  Eyes: Negative for pain and visual disturbance.  Respiratory: Negative for cough and shortness of breath.   Cardiovascular: Negative for cp or palpitations    Gastrointestinal: Negative for nausea, diarrhea and constipation.  Genitourinary: Negative for urgency and frequency.  Skin: Negative for pallor or rash   Neurological: Negative for weakness,  light-headedness, numbness and headaches.  Hematological: Negative for adenopathy. Does not bruise/bleed easily.  Psychiatric/Behavioral: Negative for dysphoric mood. The patient is not nervous/anxious.      Objective:   Physical Exam  Constitutional: She appears well-developed and well-nourished. No distress.  obese and well appearing   HENT:  Head: Normocephalic and atraumatic.  Right Ear: External ear normal.  Left Ear: External ear normal.  Mouth/Throat: Oropharynx is clear and moist.  Eyes: Pupils are equal, round, and reactive to light. Conjunctivae and EOM are normal. No scleral icterus.  Neck: Normal range of motion. Neck supple. No JVD present. Carotid bruit is not present. Thyromegaly present.  Cardiovascular: Normal rate, regular rhythm, normal heart sounds and intact distal pulses.  Exam reveals no gallop.   Pulmonary/Chest: Effort normal and breath sounds normal. No respiratory distress. She has no wheezes. She exhibits no tenderness.  Abdominal: Soft. Bowel sounds are normal. She exhibits no distension, no abdominal bruit and no mass. There is no tenderness.  Genitourinary: No breast swelling, tenderness, discharge or bleeding.  Genitourinary Comments: Breast exam: No mass, nodules, thickening, tenderness, bulging, retraction, inflamation, nipple discharge or skin changes noted.  No axillary or clavicular LA.       Musculoskeletal: Normal range of motion. She exhibits no edema or tenderness.  Lymphadenopathy:    She has no cervical adenopathy.  Neurological: She is alert. She has normal reflexes. No cranial nerve deficit. She exhibits normal muscle tone. Coordination normal.  Skin: Skin is warm and dry. No rash noted. No erythema. No pallor.  Psychiatric: She has a normal mood and affect.          Assessment & Plan:   Problem List Items Addressed This Visit      Endocrine   Left thyroid nodule    Due for 1 y f/u for L isthmus nodule  Ref made for [place] of thyroid         Relevant Orders   US THYROID     Musculoskeletal and Integument   Osteopenia  Ref for dexa to follow osteopenia in post menopausal female Hx of fibular fx  No recent falls/fx On ca and D         Other   Estrogen deficiency   Relevant Orders   DG Bone Density   Hyperglycemia    Lab Results  Component Value Date   HGBA1C 6.1 05/01/2017   Stable  Continue low refined carb diet and wt loss effort       Hyperlipidemia    Disc goals for lipids and reasons to control them Rev labs with pt Rev low sat fat diet in detail LDL is up  Will cut back on red meat      Obesity    Discussed how this problem influences overall health and the risks it imposes  Reviewed plan for weight loss with lower calorie diet (via better food choices and also portion control or program like weight watchers) and exercise building up to or more than 30 minutes 5 days per week including some aerobic activity         Routine general medical examination at a health care facility - Primary    Reviewed health habits including diet and exercise and skin cancer prevention Reviewed appropriate screening tests for age  Also reviewed health mt list, fam hx and immunization status , as well as social and family history   See HPI Labs reviewed  Will get flu shot in the fall  She will schedule her mammogram in November  Disc low cholesterol diet  Ref for dexa Disc exercise plan

## 2017-05-06 NOTE — Assessment & Plan Note (Signed)
Lab Results  Component Value Date   HGBA1C 6.1 05/01/2017   Stable  Continue low refined carb diet and wt loss effort

## 2017-05-06 NOTE — Assessment & Plan Note (Signed)
Reviewed health habits including diet and exercise and skin cancer prevention Reviewed appropriate screening tests for age  Also reviewed health mt list, fam hx and immunization status , as well as social and family history   See HPI Labs reviewed  Will get flu shot in the fall  She will schedule her mammogram in November  Disc low cholesterol diet  Ref for dexa Disc exercise plan

## 2017-05-06 NOTE — Assessment & Plan Note (Signed)
Due for 1 y f/u for L isthmus nodule  Ref made for [place] of thyroid

## 2017-05-06 NOTE — Assessment & Plan Note (Signed)
Disc goals for lipids and reasons to control them Rev labs with pt Rev low sat fat diet in detail LDL is up  Will cut back on red meat

## 2017-05-06 NOTE — Assessment & Plan Note (Signed)
Discussed how this problem influences overall health and the risks it imposes  Reviewed plan for weight loss with lower calorie diet (via better food choices and also portion control or program like weight watchers) and exercise building up to or more than 30 minutes 5 days per week including some aerobic activity    

## 2017-05-06 NOTE — Assessment & Plan Note (Signed)
Ref for dexa to follow osteopenia in post menopausal female Hx of fibular fx  No recent falls/fx On ca and D

## 2017-05-08 ENCOUNTER — Other Ambulatory Visit: Payer: Self-pay | Admitting: Family Medicine

## 2017-05-11 ENCOUNTER — Ambulatory Visit
Admission: RE | Admit: 2017-05-11 | Discharge: 2017-05-11 | Disposition: A | Discharge status: Home / Self Care | Payer: Medicare Other | Source: Ambulatory Visit | Attending: Family Medicine | Admitting: Family Medicine

## 2017-05-11 DIAGNOSIS — E041 Nontoxic single thyroid nodule: Secondary | ICD-10-CM | POA: Diagnosis present

## 2017-05-12 ENCOUNTER — Telehealth: Payer: Self-pay | Admitting: Family Medicine

## 2017-05-12 ENCOUNTER — Ambulatory Visit: Payer: Medicare Other

## 2017-05-12 DIAGNOSIS — E041 Nontoxic single thyroid nodule: Secondary | ICD-10-CM

## 2017-05-12 NOTE — Telephone Encounter (Signed)
Ref done Will route to pcc  

## 2017-05-12 NOTE — Telephone Encounter (Signed)
-----   Message from Tammi Sou, [place] sent at 05/12/2017 10:18 AM EDT ----- Pt notified of [place] results and Dr. Marliss Coots recommendations. Pt agrees with referral, please put referral in and I advised pt our [facility] would call to scheduled appt

## 2017-05-12 NOTE — Telephone Encounter (Signed)
Pt returned your call and is requesting a cb. She had a msg but doesn't know what it was about.

## 2017-05-12 NOTE — Telephone Encounter (Signed)
Addressed through result notes  

## 2017-05-16 NOTE — Telephone Encounter (Signed)
Left message asking pt to call the office regarding referral °

## 2017-06-20 ENCOUNTER — Ambulatory Visit (INDEPENDENT_AMBULATORY_CARE_PROVIDER_SITE_OTHER): Payer: Medicare Other | Admitting: Endocrinology

## 2017-06-20 ENCOUNTER — Encounter: Payer: Self-pay | Admitting: Endocrinology

## 2017-06-20 ENCOUNTER — Ambulatory Visit
Admission: RE | Admit: 2017-06-20 | Discharge: 2017-06-20 | Disposition: A | Discharge status: Home / Self Care | Payer: Medicare Other | Source: Ambulatory Visit | Attending: Family Medicine | Admitting: Family Medicine

## 2017-06-20 VITALS — BP 118/80 | HR 104 | Wt 216.6 lb

## 2017-06-20 DIAGNOSIS — E2839 Other primary ovarian failure: Secondary | ICD-10-CM | POA: Diagnosis present

## 2017-06-20 DIAGNOSIS — R232 Flushing: Secondary | ICD-10-CM

## 2017-06-20 DIAGNOSIS — M85852 Other specified disorders of bone density and structure, left thigh: Secondary | ICD-10-CM | POA: Insufficient documentation

## 2017-06-20 DIAGNOSIS — E041 Nontoxic single thyroid nodule: Secondary | ICD-10-CM | POA: Diagnosis not present

## 2017-06-20 NOTE — Progress Notes (Signed)
Subjective:    Patient ID: Deborah Carey, female    DOB: Jan 06, 1951, 66 y.o.   MRN: [id number]  HPI Pt is referred by Dr Glori Bickers, for nodular thyroid.  Pt was noted to have a nodule in the thyroid in 2015.  she has no h/o XRT or surgery to the neck.  She has intermitt diaphoresis of the face, and assoc flushing.   Past Medical History:  Diagnosis Date  . Allergy    allergic rhinitis  . Anxiety   . Infertility, female   . Osteoarthritis of both knees   . Post-menopausal bleeding    neg endo bx.    Past Surgical History:  Procedure Laterality Date  . cataract surgery  2011  . CESAREAN SECTION     times 2  . ENDOMETRIAL BIOPSY     neg for CA cells  . FACIAL COSMETIC SURGERY  2006   face lift  . FOOT SURGERY  1998   rt heel spur removal  . REFRACTIVE SURGERY  08/1999    Social History   Social History  . Marital status: Married    Spouse name: N/A  . Number of children: N/A  . Years of education: N/A   Occupational History  . Not on file.   Social History Main Topics  . Smoking status: Never Smoker  . Smokeless tobacco: Never Used  . Alcohol use No  . Drug use: No  . Sexual activity: Yes    Partners: Male    Birth control/ protection: Post-menopausal   Other Topics Concern  . Not on file   Social History Narrative  . No narrative on file    Current Outpatient Prescriptions on File Prior to Visit  Medication Sig Dispense Refill  . albuterol (PROVENTIL HFA;VENTOLIN HFA) 108 (90 Base) MCG/ACT inhaler Inhale 1-2 puffs into the lungs every 6 (six) hours as needed for wheezing (or for cough). 1 Inhaler 1  . CALCIUM PO Take by mouth as needed.     . cetirizine (ZYRTEC) 10 MG tablet Take 10 mg by mouth daily.    Marland Kitchen FLUoxetine (PROZAC) 40 MG capsule Take 1 capsule (40 mg total) by mouth daily. 90 capsule 3  . fluticasone (FLONASE) 50 MCG/ACT nasal spray Place 2 sprays into both nostrils as needed. 16 g 1  . Multiple Vitamins-Minerals (MULTIVITAMIN PO) Take by mouth  as needed.     . naproxen sodium (ANAPROX) 220 MG tablet Take 220 mg by mouth as needed.    . Omega-3 Fatty Acids (FISH OIL PO) Take by mouth as needed.      No current facility-administered medications on file prior to visit.     No Known Allergies  Family History  Problem Relation Age of Onset  . COPD Brother   . Stroke Mother   . Hypertension Mother   . Osteoporosis Mother   . Alzheimer's disease Father   . Hypertension Father   . Osteoporosis Father   . Depression Sister   . Hypertension Sister   . Osteoporosis Sister   . Thyroid disease Sister   . Hypothyroidism Sister   . Parkinson's disease Sister   . Osteoporosis Sister   . Hypothyroidism Sister   . Breast cancer Neg Hx     BP 118/80   Pulse (!) 104   Wt 216 lb 9.6 oz (98.2 kg)   LMP 09/05/2008   SpO2 98%   BMI 37.18 kg/m   Review of Systems Denies hoarseness, visual loss, neck pain, chest  pain, sob, dysphagia, diarrhea, itching, cold intolerance, headache, numbness, and rhinorrhea.  Depression is well-controlled.  She has easy bruising, fatigue, weight gain, and mild cough.      Objective:   Physical Exam VS: see vs page GEN: no distress HEAD: head: no deformity eyes: no periorbital swelling, no proptosis external nose and ears are normal mouth: no lesion seen NECK: supple, thyroid is not enlarged.  The nodule is non-palpable.   CHEST WALL: no deformity LUNGS: clear to auscultation CV: reg rate and rhythm, no murmur ABD: abdomen is soft, nontender.  no hepatosplenomegaly.  not distended.  no hernia MUSCULOSKELETAL: muscle bulk and strength are grossly normal.  no obvious joint swelling.  gait is normal and steady EXTEMITIES: no deformity.  no ulcer on the feet.  feet are of normal color and temp.  no edema PULSES: dorsalis pedis intact bilat.  no carotid bruit NEURO:  cn 2-12 grossly intact.   readily moves all 4's.  sensation is intact to touch on the feet SKIN:  Normal texture and temperature.  No  rash or suspicious lesion is visible.   NODES:  None palpable at the neck PSYCH: alert, well-oriented.  Does not appear anxious nor depressed.     Lab Results  Component Value Date   TSH 1.97 05/01/2017    [place]: Solitary left isthmic lesion is slightly larger with new punctate echogenic foci.  I have reviewed outside records, and summarized: Pt was noted to have thyroid nodule, and referred here.  She had thyr nod and dyslipidemia.  Other probs were stable.       Assessment & Plan:  Thyroid nodule, new.  bx needed.  Flushing, new.  Check calcitonin.    Patient Instructions  blood tests are requested for you today.  We'll let you know about the results. Let's check the biopsy, guided by the ultrasound.  you will receive a phone call, about a day and time for an appointment. If as expected,no cancer is found, Please come back for a follow-up appointment in 1-2 years.

## 2017-06-20 NOTE — Patient Instructions (Signed)
blood tests are requested for you today.  We'll let you know about the results. Let's check the biopsy, guided by the ultrasound.  you will receive a phone call, about a day and time for an appointment. If as expected,no cancer is found, Please come back for a follow-up appointment in 1-2 years.

## 2017-06-23 LAB — CALCITONIN: Calcitonin: 3 pg/mL (ref <=5)

## 2017-06-26 ENCOUNTER — Telehealth: Payer: Self-pay | Admitting: Family Medicine

## 2017-06-26 NOTE — Telephone Encounter (Signed)
Pt called returning your call. Please advise, thank you!  Call pt @ [phone]

## 2017-06-27 NOTE — Telephone Encounter (Signed)
Returned patients call see labs for documentation

## 2017-06-28 ENCOUNTER — Encounter: Payer: Self-pay | Admitting: *Deleted

## 2017-12-14 ENCOUNTER — Telehealth: Payer: Self-pay | Admitting: Family Medicine

## 2017-12-14 NOTE — Telephone Encounter (Signed)
Note from Dr Damita Dunnings: Tonia Ghent, MD  Taela Charbonneau, Wynelle Fanny, MD        Pt's husband saw me today. Your the PCP on [place]. She needed her parking sticker redone.     Done and in IN box

## 2017-12-14 NOTE — Telephone Encounter (Signed)
Left VM letting pt know form ready for pick up 

## 2018-01-03 ENCOUNTER — Other Ambulatory Visit: Payer: Self-pay | Admitting: Family Medicine

## 2018-01-03 DIAGNOSIS — Z1231 Encounter for screening mammogram for malignant neoplasm of breast: Secondary | ICD-10-CM

## 2018-01-23 ENCOUNTER — Ambulatory Visit
Admission: RE | Admit: 2018-01-23 | Discharge: 2018-01-23 | Disposition: A | Discharge status: Home / Self Care | Payer: Medicare Other | Source: Ambulatory Visit | Attending: Family Medicine | Admitting: Family Medicine

## 2018-01-23 DIAGNOSIS — Z1231 Encounter for screening mammogram for malignant neoplasm of breast: Secondary | ICD-10-CM | POA: Diagnosis present

## 2018-01-23 IMAGING — MG MM DIGITAL SCREENING BILAT W/ TOMO W/ CAD
8 series · 8 of 24 positions shown · non-contrast
Comparison: Previous exam(s).

CLINICAL DATA: Screening.

EXAM:
DIGITAL SCREENING BILATERAL MAMMOGRAM WITH TOMO AND CAD

[L CC synth-2D]
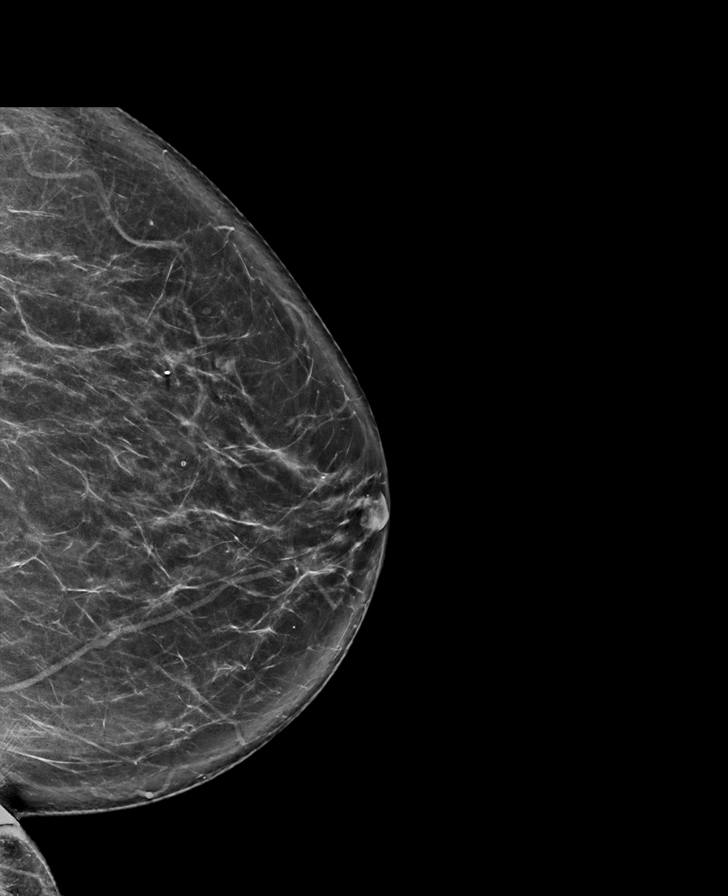

[R MLO synth-2D]
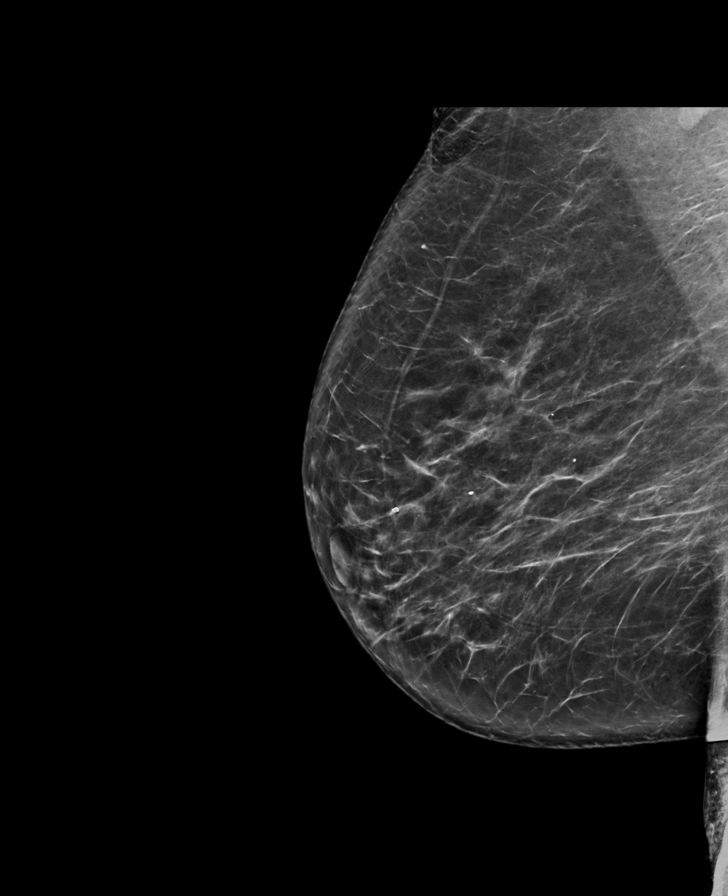

[R CC synth-2D]
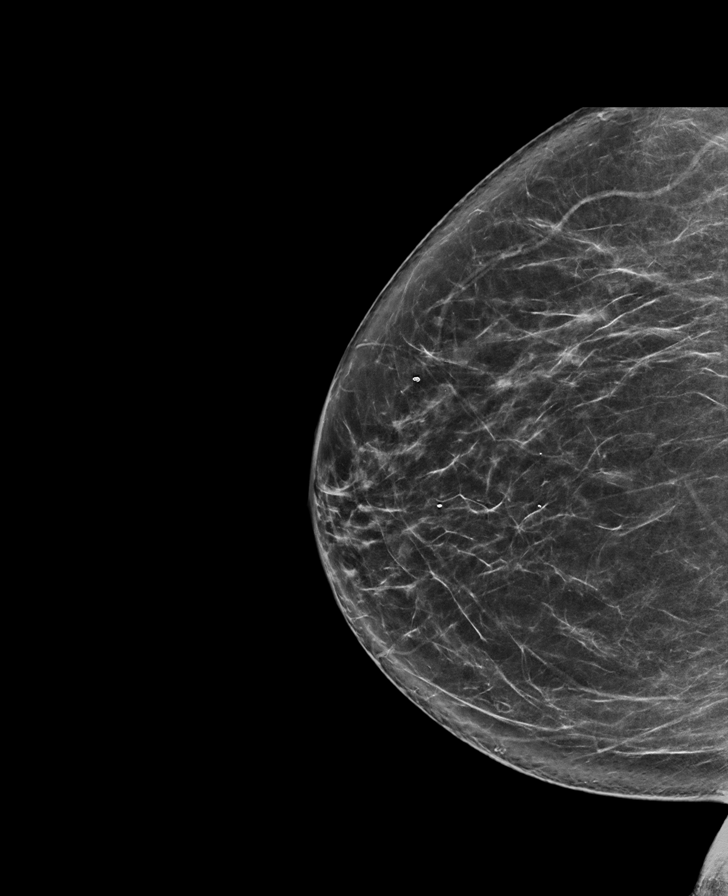

[L MLO synth-2D]
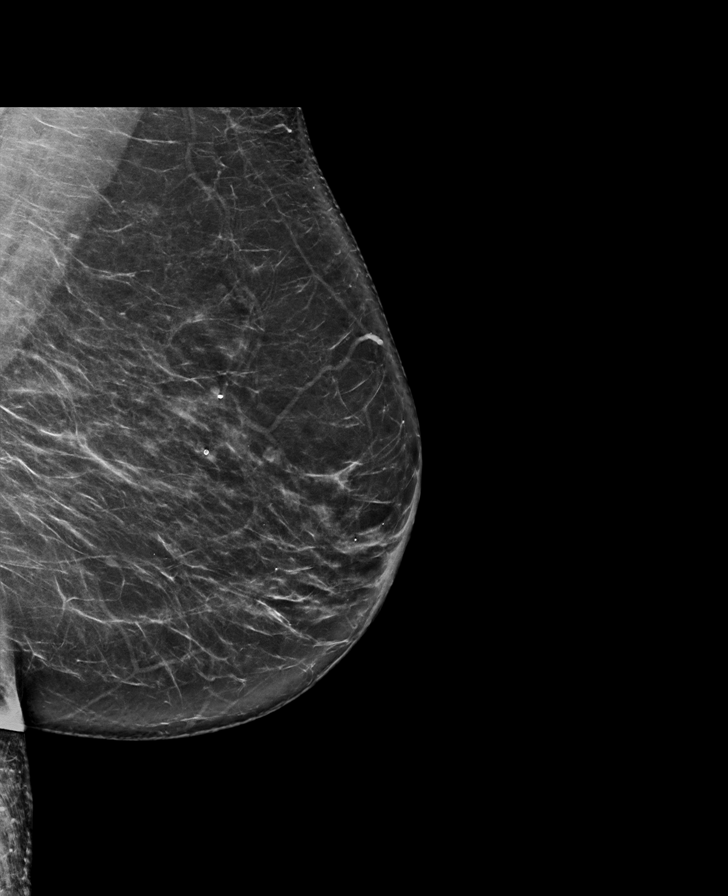

[L CC tomo · tomo slice 40/79.0]
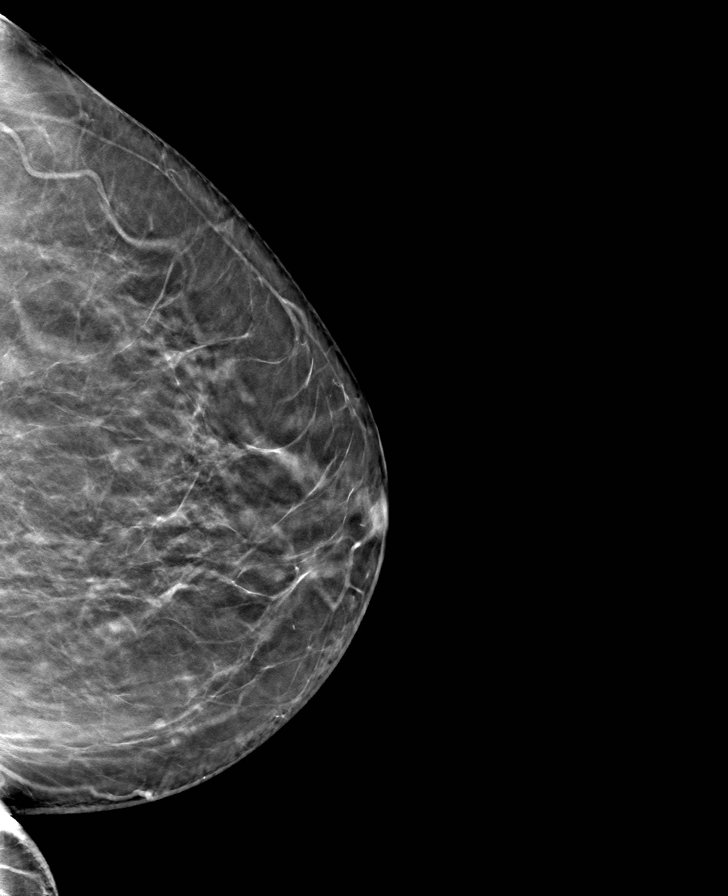

[R CC tomo · tomo slice 40/79.0]
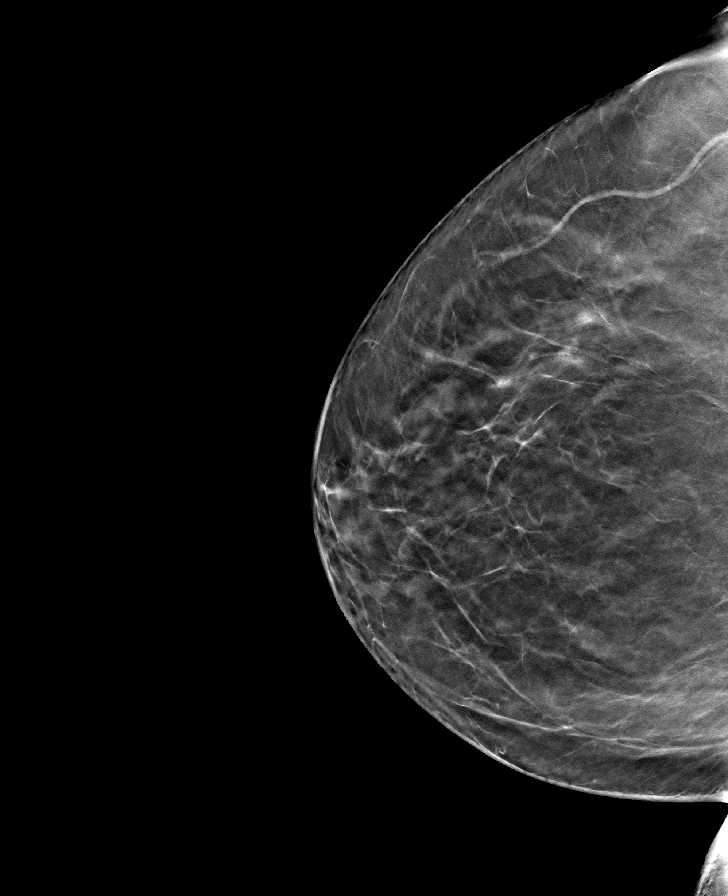

[R MLO tomo · tomo slice 45/89.0]
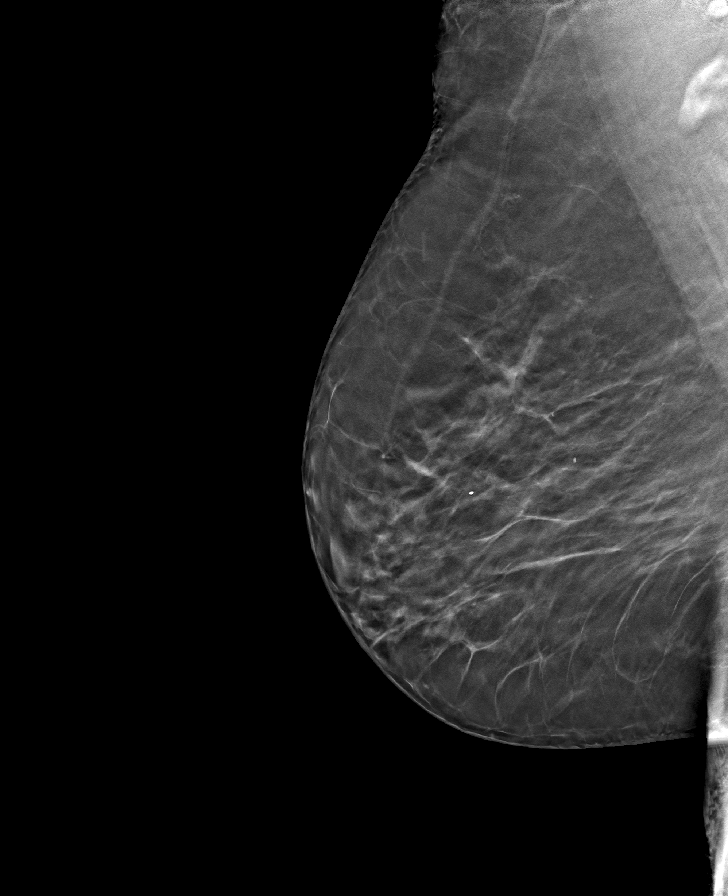

[L MLO tomo · tomo slice 43/85.0]
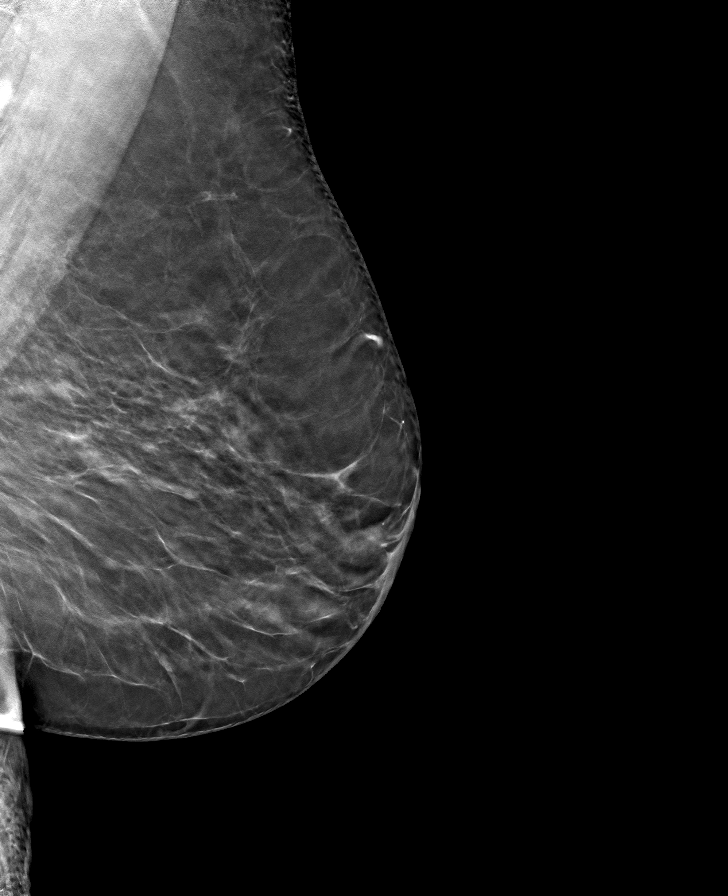

[8 of 24 positions shown; findings below may reference images not displayed]

ACR Breast Density Category b: There are scattered areas of
fibroglandular density.
FINDINGS: There are no findings suspicious for malignancy. Images were
processed with CAD.
IMPRESSION: No mammographic evidence of malignancy. A result letter of this
screening mammogram will be mailed directly to the patient.

RECOMMENDATION:
Screening mammogram in one year. (Code:[TQ])

BI-RADS CATEGORY  1: Negative.

## 2018-01-24 ENCOUNTER — Encounter: Payer: Self-pay | Admitting: *Deleted

## 2018-05-02 ENCOUNTER — Telehealth: Payer: Self-pay | Admitting: Family Medicine

## 2018-05-02 DIAGNOSIS — E7800 Pure hypercholesterolemia, unspecified: Secondary | ICD-10-CM

## 2018-05-02 DIAGNOSIS — Z0000 Encounter for general adult medical examination without abnormal findings: Secondary | ICD-10-CM

## 2018-05-02 DIAGNOSIS — R739 Hyperglycemia, unspecified: Secondary | ICD-10-CM

## 2018-05-02 NOTE — Telephone Encounter (Signed)
-----   Message from Eustace Pen, LPN sent at 5/67/2091 12:44 PM EDT ----- Regarding: Labs 8/29 Lab orders needed. Thank you.  Insurance:  [facility] Medicare

## 2018-05-03 ENCOUNTER — Ambulatory Visit (INDEPENDENT_AMBULATORY_CARE_PROVIDER_SITE_OTHER): Payer: Medicare Other

## 2018-05-03 VITALS — BP 118/76 | HR 83 | Temp 97.9°F | Ht 64.0 in | Wt 212.5 lb

## 2018-05-03 DIAGNOSIS — M8580 Other specified disorders of bone density and structure, unspecified site: Secondary | ICD-10-CM | POA: Diagnosis not present

## 2018-05-03 DIAGNOSIS — Z0000 Encounter for general adult medical examination without abnormal findings: Secondary | ICD-10-CM | POA: Diagnosis not present

## 2018-05-03 DIAGNOSIS — E7800 Pure hypercholesterolemia, unspecified: Secondary | ICD-10-CM

## 2018-05-03 DIAGNOSIS — R739 Hyperglycemia, unspecified: Secondary | ICD-10-CM

## 2018-05-03 LAB — CBC WITH DIFFERENTIAL/PLATELET
BASOS ABS: 0 10*3/uL (ref 0.0–0.1)
Basophils Relative: 0.7 % (ref 0.0–3.0)
Eosinophils Absolute: 0.1 10*3/uL (ref 0.0–0.7)
Eosinophils Relative: 1.8 % (ref 0.0–5.0)
HCT: 43.2 % (ref 36.0–46.0)
HEMOGLOBIN: 14.8 g/dL (ref 12.0–15.0)
LYMPHS PCT: 26.9 % (ref 12.0–46.0)
Lymphs Abs: 1.9 10*3/uL (ref 0.7–4.0)
MCHC: 34.2 g/dL (ref 30.0–36.0)
MCV: 94.1 fl (ref 78.0–100.0)
MONOS PCT: 7.6 % (ref 3.0–12.0)
Monocytes Absolute: 0.5 10*3/uL (ref 0.1–1.0)
NEUTROS PCT: 63 % (ref 43.0–77.0)
Neutro Abs: 4.5 10*3/uL (ref 1.4–7.7)
Platelets: 277 10*3/uL (ref 150.0–400.0)
RBC: 4.59 Mil/uL (ref 3.87–5.11)
RDW: 12.8 % (ref 11.5–15.5)
WBC: 7.1 10*3/uL (ref 4.0–10.5)

## 2018-05-03 LAB — COMPREHENSIVE METABOLIC PANEL
ALT: 12 U/L (ref 0–35)
AST: 14 U/L (ref 0–37)
Albumin: 4.4 g/dL (ref 3.5–5.2)
Alkaline Phosphatase: 73 U/L (ref 39–117)
BILIRUBIN TOTAL: 0.7 mg/dL (ref 0.2–1.2)
BUN: 15 mg/dL (ref 6–23)
CALCIUM: 9.7 mg/dL (ref 8.4–10.5)
CO2: 31 meq/L (ref 19–32)
Chloride: 102 mEq/L (ref 96–112)
Creatinine, Ser: 0.85 mg/dL (ref 0.40–1.20)
GFR: 70.35 mL/min (ref >60.00)
Glucose, Bld: 118 mg/dL — ABNORMAL HIGH (ref 70–99)
Potassium: 4.5 mEq/L (ref 3.5–5.1)
Sodium: 139 mEq/L (ref 135–145)
Total Protein: 7.5 g/dL (ref 6.0–8.3)

## 2018-05-03 LAB — LIPID PANEL
CHOL/HDL RATIO: 4
Cholesterol: 182 mg/dL (ref 0–200)
HDL: 49.2 mg/dL (ref >39.00)
LDL Cholesterol: 113 mg/dL — ABNORMAL HIGH (ref 0–99)
NONHDL: 133.06
Triglycerides: 102 mg/dL (ref 0.0–149.0)
VLDL: 20.4 mg/dL (ref 0.0–40.0)

## 2018-05-03 LAB — TSH: TSH: 1.56 u[IU]/mL (ref 0.35–4.50)

## 2018-05-03 LAB — VITAMIN D 25 HYDROXY (VIT D DEFICIENCY, FRACTURES): VITD: 23.88 ng/mL — ABNORMAL LOW (ref 30.00–100.00)

## 2018-05-03 LAB — HEMOGLOBIN A1C: Hgb A1c MFr Bld: 6.3 % (ref 4.6–6.5)

## 2018-05-03 NOTE — Patient Instructions (Signed)
[name] , Thank you for taking time to come for your Medicare Wellness Visit. I appreciate your ongoing commitment to your health goals. Please review the following plan we discussed and let me know if I can assist you in the future.   These are the goals we discussed: Goals    . DIET - INCREASE WATER INTAKE     Starting 05/03/2018, I will continue to drink at least 6-8 glasses of water daily.        This is a list of the screening recommended for you and due dates:  Health Maintenance  Topic Date Due  . Flu Shot  12/05/2018*  . Colon Cancer Screening  10/06/2018  . Mammogram  01/24/2019  . Tetanus Vaccine  11/06/2022  . DEXA scan (bone density measurement)  Completed  .  Hepatitis C: One time screening is recommended by Center for Disease Control  (CDC) for  adults born from 1 through 1965.   Completed  . Pneumonia vaccines  Completed  *Topic was postponed. The date shown is not the original due date.   Preventive Care for Adults  A healthy lifestyle and preventive care can promote health and wellness. Preventive health guidelines for adults include the following key practices.  . A routine yearly physical is a good way to check with your health care provider about your health and preventive screening. It is a chance to share any concerns and updates on your health and to receive a thorough exam.  . Visit your dentist for a routine exam and preventive care every 6 months. Brush your teeth twice a day and floss once a day. Good oral hygiene prevents tooth decay and gum disease.  . The frequency of eye exams is based on your age, health, family medical history, use  of contact lenses, and other factors. Follow your health care provider's recommendations for frequency of eye exams.  . Eat a healthy diet. Foods like vegetables, fruits, whole grains, low-fat dairy products, and lean protein foods contain the nutrients you need without too many calories. Decrease your intake of  foods high in solid fats, added sugars, and salt. Eat the right amount of calories for you. Get information about a proper diet from your health care provider, if necessary.  . Regular physical exercise is one of the most important things you can do for your health. Most adults should get at least 150 minutes of moderate-intensity exercise (any activity that increases your heart rate and causes you to sweat) each week. In addition, most adults need muscle-strengthening exercises on 2 or more days a week.  Silver Sneakers may be a benefit available to you. To determine eligibility, you may visit the website: www.silversneakers.com or contact program at 9282110638 Mon-Fri between 8AM-8PM.   . Maintain a healthy weight. The body mass index (BMI) is a screening tool to identify possible weight problems. It provides an estimate of body fat based on height and weight. Your health care provider can find your BMI and can help you achieve or maintain a healthy weight.   For adults 20 years and older: ? A BMI below 18.5 is considered underweight. ? A BMI of 18.5 to 24.9 is normal. ? A BMI of 25 to 29.9 is considered overweight. ? A BMI of 30 and above is considered obese.   . Maintain normal blood lipids and cholesterol levels by exercising and minimizing your intake of saturated fat. Eat a balanced diet with plenty of fruit and vegetables. Blood tests  for lipids and cholesterol should begin at age 75 and be repeated every 5 years. If your lipid or cholesterol levels are high, you are over 50, or you are at high risk for heart disease, you may need your cholesterol levels checked more frequently. Ongoing high lipid and cholesterol levels should be treated with medicines if diet and exercise are not working.  . If you smoke, find out from your health care provider how to quit. If you do not use tobacco, please do not start.  . If you choose to drink alcohol, please do not consume more than 2 drinks per  day. One drink is considered to be 12 ounces (355 mL) of beer, 5 ounces (148 mL) of wine, or 1.5 ounces (44 mL) of liquor.  . If you are 27-81 years old, ask your health care provider if you should take aspirin to prevent strokes.  . Use sunscreen. Apply sunscreen liberally and repeatedly throughout the day. You should seek shade when your shadow is shorter than you. Protect yourself by wearing long sleeves, pants, a wide-brimmed hat, and sunglasses year round, whenever you are outdoors.  . Once a month, do a whole body skin exam, using a mirror to look at the skin on your back. Tell your health care provider of new moles, moles that have irregular borders, moles that are larger than a pencil eraser, or moles that have changed in shape or color.

## 2018-05-03 NOTE — Progress Notes (Signed)
PCP notes:   Health maintenance:  Flu vaccine- addressed  Abnormal screenings:   None  Patient concerns:   None  Nurse concerns:  None  Next PCP appt:   05/08/18 @ 0930  I reviewed health advisor's note, was available for consultation, and agree with documentation and plan. Loura Pardon MD

## 2018-05-03 NOTE — Progress Notes (Signed)
Subjective:   Deborah Carey is a 67 y.o. female who presents for Medicare Annual (Subsequent) preventive examination.  Review of Systems:  N/A Cardiac Risk Factors include: advanced age (>11men, >91 women);dyslipidemia;obesity (BMI >30kg/m2)     Objective:     Vitals: BP 118/76 (BP Location: Right Arm, Patient Position: Sitting, Cuff Size: Normal)   Pulse 83   Temp 97.9 F (36.6 C) (Oral)   Ht 5\' 4"  (1.626 m) Comment: no shoes  Wt 212 lb 8 oz (96.4 kg)   LMP 09/05/2008   SpO2 98%   BMI 36.48 kg/m   Body mass index is 36.48 kg/m.  Advanced Directives 05/03/2018 05/01/2017 04/29/2016  Does Patient Have a Medical Advance Directive? Yes Yes Yes  Type of Paramedic of [place];Living will [place];Living will  Does patient want to make changes to medical advance directive? - - No - Patient declined  Copy of [place] in Chart? No - copy requested No - copy requested -    Tobacco Social History   Tobacco Use  Smoking Status Never Smoker  Smokeless Tobacco Never Used     Counseling given: No   Clinical Intake:  Pre-visit preparation completed: Yes  Pain : No/denies pain Pain Score: 0-No pain     Nutritional Status: BMI > 30  Obese Nutritional Risks: None Diabetes: No  How often do you need to have someone help you when you read instructions, pamphlets, or other written materials from your doctor or pharmacy?: 1 - Never What is the last grade level you completed in school?: Bachelors degree  Interpreter Needed?: No  Comments: pt lives with spouse Information entered by :: LPinson, LPN  Past Medical History:  Diagnosis Date  . Allergy    allergic rhinitis  . Anxiety   . Infertility, female   . Osteoarthritis of both knees   . Post-menopausal bleeding    neg endo bx.   Past Surgical History:  Procedure Laterality Date  . cataract surgery  2011  . CESAREAN  SECTION     times 2  . ENDOMETRIAL BIOPSY     neg for CA cells  . FACIAL COSMETIC SURGERY  2006   face lift  . FOOT SURGERY  1998   rt heel spur removal  . REFRACTIVE SURGERY  08/1999   Family History  Problem Relation Age of Onset  . COPD Brother   . Stroke Mother   . Hypertension Mother   . Osteoporosis Mother   . Alzheimer's disease Father   . Hypertension Father   . Osteoporosis Father   . Depression Sister   . Hypertension Sister   . Osteoporosis Sister   . Thyroid disease Sister   . Hypothyroidism Sister   . Parkinson's disease Sister   . Osteoporosis Sister   . Hypothyroidism Sister   . Breast cancer Neg Hx    Social History   Socioeconomic History  . Marital status: Married    Spouse name: Not on file  . Number of children: Not on file  . Years of education: Not on file  . Highest education level: Not on file  Occupational History  . Not on file  Social Needs  . Financial resource strain: Not on file  . Food insecurity:    Worry: Not on file    Inability: Not on file  . Transportation needs:    Medical: Not on file    Non-medical: Not on  file  Tobacco Use  . Smoking status: Never Smoker  . Smokeless tobacco: Never Used  Substance and Sexual Activity  . Alcohol use: No    Alcohol/week: 0.0 standard drinks  . Drug use: No  . Sexual activity: Yes    Partners: Male    Birth control/protection: Post-menopausal  Lifestyle  . Physical activity:    Days per week: Not on file    Minutes per session: Not on file  . Stress: Not on file  Relationships  . Social connections:    Talks on phone: Not on file    Gets together: Not on file    Attends religious service: Not on file    Active member of club or organization: Not on file    Attends meetings of clubs or organizations: Not on file    Relationship status: Not on file  Other Topics Concern  . Not on file  Social History Narrative  . Not on file    Outpatient Encounter Medications as of  05/03/2018  Medication Sig  . albuterol (PROVENTIL HFA;VENTOLIN HFA) 108 (90 Base) MCG/ACT inhaler Inhale 1-2 puffs into the lungs every 6 (six) hours as needed for wheezing (or for cough).  . CALCIUM PO Take by mouth as needed.   . cetirizine (ZYRTEC) 10 MG tablet Take 10 mg by mouth daily.  . fluticasone (FLONASE) 50 MCG/ACT nasal spray Place 2 sprays into both nostrils as needed.  . Multiple Vitamins-Minerals (MULTIVITAMIN PO) Take by mouth as needed.   . naproxen sodium (ANAPROX) 220 MG tablet Take 220 mg by mouth as needed.  . Omega-3 Fatty Acids (FISH OIL PO) Take by mouth as needed.   Marland Kitchen FLUoxetine (PROZAC) 40 MG capsule Take 1 capsule (40 mg total) by mouth daily. (Patient not taking: Reported on 05/03/2018)   No facility-administered encounter medications on file as of 05/03/2018.     Activities of Daily Living In your present state of health, do you have any difficulty performing the following activities: 05/03/2018  Hearing? N  Vision? N  Difficulty concentrating or making decisions? N  Walking or climbing stairs? N  Dressing or bathing? N  Doing errands, shopping? N  Preparing Food and eating ? N  Using the Toilet? N  In the past six months, have you accidently leaked urine? N  Do you have problems with loss of bowel control? N  Managing your Medications? N  Managing your Finances? N  Housekeeping or managing your Housekeeping? N  Some recent data might be hidden    Patient Care Team: Tower, Wynelle Fanny, MD as PCP - General Hollice Espy Phylis Bougie, CNM as Referring Physician (Certified Nurse Midwife) Conception Chancy, DDS as Referring Physician (Dentistry)    Assessment:   This is a routine wellness examination for Deborah Carey  Exercise Activities and Dietary recommendations Current Exercise Habits: Home exercise routine, Type of exercise: walking(5000 steps daily), Frequency (Times/Week): 7, Intensity: Mild, Exercise limited by: orthopedic condition(s)  Goals    . DIET - INCREASE  WATER INTAKE     Starting 05/03/2018, I will continue to drink at least 6-8 glasses of water daily.        Fall Risk Fall Risk  05/03/2018 05/01/2017 04/29/2016 04/28/2015 11/29/2013  Falls in the past year? No No No Yes No  Number falls in past yr: - - - 1 -  Risk for fall due to : - - - Impaired balance/gait -   Depression Screen [facility] 2/9 Scores 05/03/2018 05/01/2017 04/29/2016 04/28/2015  PHQ - 2 Score 0 0 1 0  PHQ- 9 Score 0 4 - -     Cognitive Function MMSE - Mini Mental State Exam 05/03/2018 05/01/2017 04/29/2016  Orientation to time 5 5 5   Orientation to Place 5 5 5   Registration 3 3 3   Attention/ Calculation 0 0 0  Recall 3 3 3   Language- name 2 objects 0 0 0  Language- repeat 1 1 1   Language- follow 3 step command 3 3 3   Language- read & follow direction 0 0 0  Write a sentence 0 0 0  Copy design 0 0 0  Total score 20 20 20      PLEASE NOTE: A Mini-Cog screen was completed. Maximum score is 20. A value of 0 denotes this part of [place] MMSE was not completed or the patient failed this part of the Mini-Cog screening.   Mini-Cog Screening Orientation to Time - Max 5 pts Orientation to Place - Max 5 pts Registration - Max 3 pts Recall - Max 3 pts Language Repeat - Max 1 pts Language Follow 3 Step Command - Max 3 pts     Immunization History  Administered Date(s) Administered  . Influenza Split 09/27/2011  . Influenza, High Dose Seasonal PF 07/18/2017  . Influenza,inj,Quad PF,6+ Mos 09/09/2015, 04/29/2016  . Pneumococcal Conjugate-13 04/28/2015  . Pneumococcal Polysaccharide-23 11/29/2013  . Td 01/21/2003  . Tdap 11/05/2012    Screening Tests Health Maintenance  Topic Date Due  . INFLUENZA VACCINE  12/05/2018 (Originally 04/05/2018)  . COLONOSCOPY  10/06/2018  . MAMMOGRAM  01/24/2019  . TETANUS/TDAP  11/06/2022  . DEXA SCAN  Completed  . Hepatitis C Screening  Completed  . PNA vac Low Risk Adult  Completed       Plan:   I have personally reviewed,  addressed, and noted the following in the patient's chart:  A. Medical and social history B. Use of alcohol, tobacco or illicit drugs  C. Current medications and supplements D. Functional ability and status E.  Nutritional status F.  Physical activity G. Advance directives H. List of other physicians I.  Hospitalizations, surgeries, and ER visits in previous 12 months J.  [place] to include hearing, vision, cognitive, depression L. Referrals and appointments - none  In addition, I have reviewed and discussed with patient certain preventive protocols, quality metrics, and best practice recommendations. A written personalized care plan for preventive services as well as general preventive health recommendations were provided to patient.  See attached scanned questionnaire for additional information.   Signed,   Lindell Noe, MHA, BS, LPN Health Coach

## 2018-05-08 ENCOUNTER — Telehealth: Payer: Self-pay | Admitting: Family Medicine

## 2018-05-08 ENCOUNTER — Encounter: Payer: Self-pay | Admitting: Family Medicine

## 2018-05-08 ENCOUNTER — Ambulatory Visit (INDEPENDENT_AMBULATORY_CARE_PROVIDER_SITE_OTHER): Payer: Medicare Other | Admitting: Family Medicine

## 2018-05-08 VITALS — BP 142/80 | HR 83 | Temp 98.0°F | Ht 64.0 in | Wt 216.5 lb

## 2018-05-08 DIAGNOSIS — Z6836 Body mass index (BMI) 36.0-36.9, adult: Secondary | ICD-10-CM

## 2018-05-08 DIAGNOSIS — E7800 Pure hypercholesterolemia, unspecified: Secondary | ICD-10-CM

## 2018-05-08 DIAGNOSIS — E041 Nontoxic single thyroid nodule: Secondary | ICD-10-CM

## 2018-05-08 DIAGNOSIS — Z0000 Encounter for general adult medical examination without abnormal findings: Secondary | ICD-10-CM

## 2018-05-08 DIAGNOSIS — Z23 Encounter for immunization: Secondary | ICD-10-CM | POA: Diagnosis not present

## 2018-05-08 DIAGNOSIS — M8589 Other specified disorders of bone density and structure, multiple sites: Secondary | ICD-10-CM

## 2018-05-08 DIAGNOSIS — R739 Hyperglycemia, unspecified: Secondary | ICD-10-CM | POA: Diagnosis not present

## 2018-05-08 DIAGNOSIS — E559 Vitamin D deficiency, unspecified: Secondary | ICD-10-CM | POA: Insufficient documentation

## 2018-05-08 DIAGNOSIS — I499 Cardiac arrhythmia, unspecified: Secondary | ICD-10-CM | POA: Diagnosis not present

## 2018-05-08 NOTE — Assessment & Plan Note (Signed)
Pt never had the FNA that was recommended/referred last year  Will re order this and schedule it

## 2018-05-08 NOTE — Telephone Encounter (Signed)
Thyroid US scheduled and Thyroid Biopsy also scheduled.

## 2018-05-08 NOTE — Assessment & Plan Note (Signed)
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

## 2018-05-08 NOTE — Assessment & Plan Note (Signed)
Lab Results  Component Value Date   HGBA1C 6.3 05/03/2018   This is up  disc imp of low glycemic diet and wt loss to prevent DM2

## 2018-05-08 NOTE — Assessment & Plan Note (Signed)
Disc goals for lipids and reasons to control them Rev last labs with pt Rev low sat fat diet in detail LDL is down and HDL up

## 2018-05-08 NOTE — Assessment & Plan Note (Addendum)
Discussed how this problem influences overall health and the risks it imposes  Reviewed plan for weight loss with lower calorie diet (via better food choices and also portion control or program like weight watchers) and exercise building up to or more than 30 minutes 5 days per week including some aerobic activity    

## 2018-05-08 NOTE — Assessment & Plan Note (Signed)
D level low at 23.8 in setting of osteopenia  Disc imp to bone and overall health  Recommend D3 2000 iu daily (in addn to ca plus D)

## 2018-05-08 NOTE — Telephone Encounter (Signed)
Ref for thyroid US

## 2018-05-08 NOTE — Assessment & Plan Note (Signed)
Reviewed health habits including diet and exercise and skin cancer prevention Reviewed appropriate screening tests for age  Also reviewed health mt list, fam hx and immunization status , as well as social and family history   See HPI Labs reviewed amw reviewed  Flu shot today  Ref for FNA thyroid nodule  Disc low glycemic diet and exercise for wt loss and DM prevention  Disc need for inc vit D intake for bone health

## 2018-05-08 NOTE — Progress Notes (Signed)
Subjective:    Patient ID: Deborah Carey, female    DOB: 1951-06-01, 67 y.o.   MRN: [id number]  HPI  Here for health maintenance exam and to review chronic medical problems    She feels pretty good  Stopped her fluoxetine - is a bit crabbier - can live with that   Taking fair care of herself    Wt Readings from Last 3 Encounters:  05/08/18 216 lb 8 oz (98.2 kg)  05/03/18 212 lb 8 oz (96.4 kg)  06/20/17 216 lb 9.6 oz (98.2 kg)  has been swimming a lot for exercise- lake[place]beach/ and a lot of yard work  Monitors her steps also with apple watch -working on that - working up to 6000  She is not eating healthy - pigging out (esp this weekend)  Surveyor, minerals to wt watchers- lost 15 lb and then gained it back  (sweets) Is going to go back  37.16 kg/m   BP Readings from Last 3 Encounters:  05/08/18 (!) 142/80  05/03/18 118/76  06/20/17 118/80    Had amw 8/29 No gaps or concerns  occ she has irregular heartbeat -all the time   Flu vaccine -will get today   Gyn -has not needed to go   Colonoscopy 2/10 with 10 y recall  No fam hx of colon cancer  10 y in feb   Mammogram 5/19 Self breast exam - no lumps or changes   dexa 10/18  Hx of osteopenia and past fibular fx  fam hx of OP No falls or fx this year  D level is low at 23.8   H/o thyroid nodule  Went to Dr Loanne Drilling- he could not feel the nodule due to small size  Was supposed to go to [place] radiology for a biopsy  Needs ref for US guided thyroid nodule - at [facility] imaging (she never heard back)    Elevated blood glucose Lab Results  Component Value Date   HGBA1C 6.3 05/03/2018  up from 6.1    Hyperlipidemia Lab Results  Component Value Date   CHOL 182 05/03/2018   CHOL 190 05/01/2017   CHOL 189 04/20/2016   Lab Results  Component Value Date   HDL 49.20 05/03/2018   HDL 43.10 05/01/2017   HDL 47.30 04/20/2016   Lab Results  Component Value Date   LDLCALC 113 (H) 05/03/2018   LDLCALC 122 (H) 05/01/2017   LDLCALC 116 (H) 04/20/2016   Lab Results  Component Value Date   TRIG 102.0 05/03/2018   TRIG 124.0 05/01/2017   TRIG 128.0 04/20/2016   Lab Results  Component Value Date   CHOLHDL 4 05/03/2018   CHOLHDL 4 05/01/2017   CHOLHDL 4 04/20/2016   Lab Results  Component Value Date   LDLDIRECT 141.8 10/30/2013   This improved with decreased LDL  Grills instead of fries foods   Lab Results  Component Value Date   WBC 7.1 05/03/2018   HGB 14.8 05/03/2018   HCT 43.2 05/03/2018   MCV 94.1 05/03/2018   PLT 277.0 05/03/2018    Lab Results  Component Value Date   CREATININE 0.85 05/03/2018   BUN 15 05/03/2018   NA 139 05/03/2018   K 4.5 05/03/2018   CL 102 05/03/2018   CO2 31 05/03/2018   Lab Results  Component Value Date   ALT 12 05/03/2018   AST 14 05/03/2018   ALKPHOS 73 05/03/2018   BILITOT 0.7 05/03/2018    Lab Results  Component Value Date  TSH 1.56 05/03/2018    Glucose 118 fasting   Patient Active Problem List   Diagnosis Date Noted  . Vitamin D deficiency 05/08/2018  . [place] 06/20/2017  . H/O fracture of fibula 01/21/2014  . Osteopenia 01/07/2014  . Family history of osteoporosis 11/29/2013  . Estrogen deficiency 11/29/2013  . Hyperlipidemia 11/29/2013  . Encounter for Medicare annual wellness exam 10/29/2013  . Irregular heartbeat 10/07/2013  . Left thyroid nodule 09/26/2013  . Enlarged thyroid 09/16/2013  . Primary osteoarthritis of both knees 11/05/2012  . Other screening mammogram 09/27/2011  . Routine general medical examination at a health care facility 09/19/2011  . Class 2 severe obesity due to excess calories with serious comorbidity and body mass index (BMI) of 36.0 to 36.9 in adult ([place]) 06/06/2008  . Hyperglycemia 06/06/2008  . ANXIETY 04/17/2007  . ALLERGIC RHINITIS 04/17/2007   Past Medical History:  Diagnosis Date  . Allergy    allergic rhinitis  . Anxiety   . Infertility, female   . Osteoarthritis of both knees   .  Post-menopausal bleeding    neg endo bx.   Past Surgical History:  Procedure Laterality Date  . cataract surgery  2011  . CESAREAN SECTION     times 2  . ENDOMETRIAL BIOPSY     neg for CA cells  . FACIAL COSMETIC SURGERY  2006   face lift  . FOOT SURGERY  1998   rt heel spur removal  . REFRACTIVE SURGERY  08/1999   Social History   Tobacco Use  . Smoking status: Never Smoker  . Smokeless tobacco: Never Used  Substance Use Topics  . Alcohol use: No    Alcohol/week: 0.0 standard drinks  . Drug use: No   Family History  Problem Relation Age of Onset  . COPD Brother   . Stroke Mother   . Hypertension Mother   . Osteoporosis Mother   . Alzheimer's disease Father   . Hypertension Father   . Osteoporosis Father   . Depression Sister   . Hypertension Sister   . Osteoporosis Sister   . Thyroid disease Sister   . Hypothyroidism Sister   . Parkinson's disease Sister   . Osteoporosis Sister   . Hypothyroidism Sister   . Breast cancer Neg Hx    No Known Allergies Current Outpatient Medications on File Prior to Visit  Medication Sig Dispense Refill  . albuterol (PROVENTIL HFA;VENTOLIN HFA) 108 (90 Base) MCG/ACT inhaler Inhale 1-2 puffs into the lungs every 6 (six) hours as needed for wheezing (or for cough). 1 Inhaler 1  . CALCIUM PO Take by mouth as needed.     . cetirizine (ZYRTEC) 10 MG tablet Take 10 mg by mouth daily.    . fluticasone (FLONASE) 50 MCG/ACT nasal spray Place 2 sprays into both nostrils as needed. 16 g 1  . Multiple Vitamins-Minerals (MULTIVITAMIN PO) Take by mouth as needed.     . naproxen sodium (ANAPROX) 220 MG tablet Take 220 mg by mouth as needed.    . Omega-3 Fatty Acids (FISH OIL PO) Take by mouth as needed.      No current facility-administered medications on file prior to visit.     Review of Systems  Constitutional: Negative for activity change, appetite change, fatigue, fever and unexpected weight change.  HENT: Negative for congestion, ear  pain, rhinorrhea, sinus pressure and sore throat.   Eyes: Negative for pain, redness and visual disturbance.  Respiratory: Negative for cough, shortness of breath and wheezing.  Cardiovascular: Negative for chest pain and palpitations.  Gastrointestinal: Negative for abdominal pain, blood in stool, constipation and diarrhea.       Occ food feels stuck in esophagus if she eats too fast  Endocrine: Negative for polydipsia and polyuria.  Genitourinary: Negative for dysuria, frequency and urgency.  Musculoskeletal: Negative for arthralgias, back pain and myalgias.  Skin: Negative for pallor and rash.  Allergic/Immunologic: Negative for environmental allergies.  Neurological: Negative for dizziness, syncope and headaches.  Hematological: Negative for adenopathy. Does not bruise/bleed easily.  Psychiatric/Behavioral: Negative for decreased concentration and dysphoric mood. The patient is not nervous/anxious.        Objective:   Physical Exam  Constitutional: She appears well-developed and well-nourished. No distress.  obese and well appearing   HENT:  Head: Normocephalic and atraumatic.  Right Ear: External ear normal.  Left Ear: External ear normal.  Mouth/Throat: Oropharynx is clear and moist.  Eyes: Pupils are equal, round, and reactive to light. Conjunctivae and EOM are normal. No scleral icterus.  Neck: Normal range of motion. Neck supple. No JVD present. Carotid bruit is not present. No thyromegaly present.  Cardiovascular: Normal rate, regular rhythm, normal heart sounds and intact distal pulses. Exam reveals no gallop.  Pulmonary/Chest: Effort normal and breath sounds normal. No respiratory distress. She has no wheezes. She exhibits no tenderness. No breast tenderness, discharge or bleeding.  Abdominal: Soft. Bowel sounds are normal. She exhibits no distension, no abdominal bruit and no mass. There is no tenderness.  Genitourinary: No breast tenderness, discharge or bleeding.    Genitourinary Comments: Breast exam: No mass, nodules, thickening, tenderness, bulging, retraction, inflamation, nipple discharge or skin changes noted.  No axillary or clavicular LA.      Musculoskeletal: Normal range of motion. She exhibits no edema or tenderness.  Lymphadenopathy:    She has no cervical adenopathy.  Neurological: She is alert. She has normal reflexes. She displays normal reflexes. No cranial nerve deficit. She exhibits normal muscle tone. Coordination normal.  Skin: Skin is warm and dry. No rash noted. No erythema. No pallor.  Solar lentigines diffusely   Psychiatric: She has a normal mood and affect.  Good mood          Assessment & Plan:   Problem List Items Addressed This Visit      Endocrine   Left thyroid nodule    Pt never had the FNA that was recommended/referred last year  Will re order this and schedule it       Relevant Orders   US Thyroid Biopsy     Musculoskeletal and Integument   Osteopenia    Mild w/o recent falls or fx  dexa 10/18 Disc low D level - will start D3 2000 iu in addition to ca plus D Continue to follow Strong fam hx of OP        Other   Class 2 severe obesity due to excess calories with serious comorbidity and body mass index (BMI) of 36.0 to 36.9 in adult [facility])    Discussed how this problem influences overall health and the risks it imposes  Reviewed plan for weight loss with lower calorie diet (via better food choices and also portion control or program like weight watchers) and exercise building up to or more than 30 minutes 5 days per week including some aerobic activity         Hyperglycemia    Lab Results  Component Value Date   HGBA1C 6.3 05/03/2018   This is  up  disc imp of low glycemic diet and wt loss to prevent DM2       Hyperlipidemia    Disc goals for lipids and reasons to control them Rev last labs with pt Rev low sat fat diet in detail LDL is down and HDL up        Irregular heartbeat     Pt's fitness watch tells her this but she has no symptoms and does not feel like her HR is fast  EKG today - 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  No signs of a fib / rate was 92  No ST-T wave deformities Will keep an eye on this- watching out for palpitations  BP of 142/80 today - lifestyle change recommended        Relevant Orders   EKG 12-Lead (Completed)   Routine general medical examination at a health care facility - Primary    Reviewed health habits including diet and exercise and skin cancer prevention Reviewed appropriate screening tests for age  Also reviewed health mt list, fam hx and immunization status , as well as social and family history   See HPI Labs reviewed amw reviewed  Flu shot today  Ref for FNA thyroid nodule  Disc low glycemic diet and exercise for wt loss and DM prevention  Disc need for inc vit D intake for bone health      Vitamin D deficiency    D level low at 23.8 in setting of osteopenia  Disc imp to bone and overall health  Recommend D3 2000 iu daily (in addn to ca plus D)        Other Visit Diagnoses    Need for influenza vaccination       Relevant Orders   Flu Vaccine QUAD 6+ mos PF IM (Fluarix Quad PF) (Completed)

## 2018-05-08 NOTE — Assessment & Plan Note (Signed)
Mild w/o recent falls or fx  dexa 10/18 Disc low D level - will start D3 2000 iu in addition to ca plus D Continue to follow Strong fam hx of OP

## 2018-05-08 NOTE — Patient Instructions (Addendum)
Try to get most of your carbohydrates from produce (with the exception of white potatoes)  Eat less bread/pasta/rice/snack foods/cereals/sweets and other items from the middle of the grocery store (processed carbs)   A1C is up to 6.3 -- closer to diabetes Weight loss and less sugar / processed carbs  Also daily exercise   Take vitamin D3 -= over the counter and take 2000 iu per day / every day   Flu shot today  EKG today   We will refer you for thyroid biopsy

## 2018-05-22 ENCOUNTER — Ambulatory Visit
Admission: RE | Admit: 2018-05-22 | Discharge: 2018-05-22 | Disposition: A | Discharge status: Home / Self Care | Payer: Medicare Other | Source: Ambulatory Visit | Attending: Family Medicine | Admitting: Family Medicine

## 2018-05-22 DIAGNOSIS — E041 Nontoxic single thyroid nodule: Secondary | ICD-10-CM

## 2018-05-23 ENCOUNTER — Other Ambulatory Visit: Payer: Self-pay | Admitting: Family Medicine

## 2019-03-11 ENCOUNTER — Other Ambulatory Visit: Payer: Self-pay | Admitting: Family Medicine

## 2019-03-11 DIAGNOSIS — Z1231 Encounter for screening mammogram for malignant neoplasm of breast: Secondary | ICD-10-CM

## 2019-04-16 ENCOUNTER — Ambulatory Visit
Admission: RE | Admit: 2019-04-16 | Discharge: 2019-04-16 | Disposition: A | Discharge status: Home / Self Care | Payer: Medicare Other | Source: Ambulatory Visit | Attending: Family Medicine | Admitting: Family Medicine

## 2019-04-16 ENCOUNTER — Other Ambulatory Visit: Payer: Self-pay

## 2019-04-16 DIAGNOSIS — Z1231 Encounter for screening mammogram for malignant neoplasm of breast: Secondary | ICD-10-CM | POA: Diagnosis present

## 2019-04-16 IMAGING — MG DIGITAL SCREENING BILATERAL MAMMOGRAM WITH TOMO AND CAD
8 series · 8 of 24 positions shown · non-contrast
Comparison: Previous exam(s).

CLINICAL DATA: Screening.

EXAM:
DIGITAL SCREENING BILATERAL MAMMOGRAM WITH TOMO AND CAD

[R CC synth-2D]
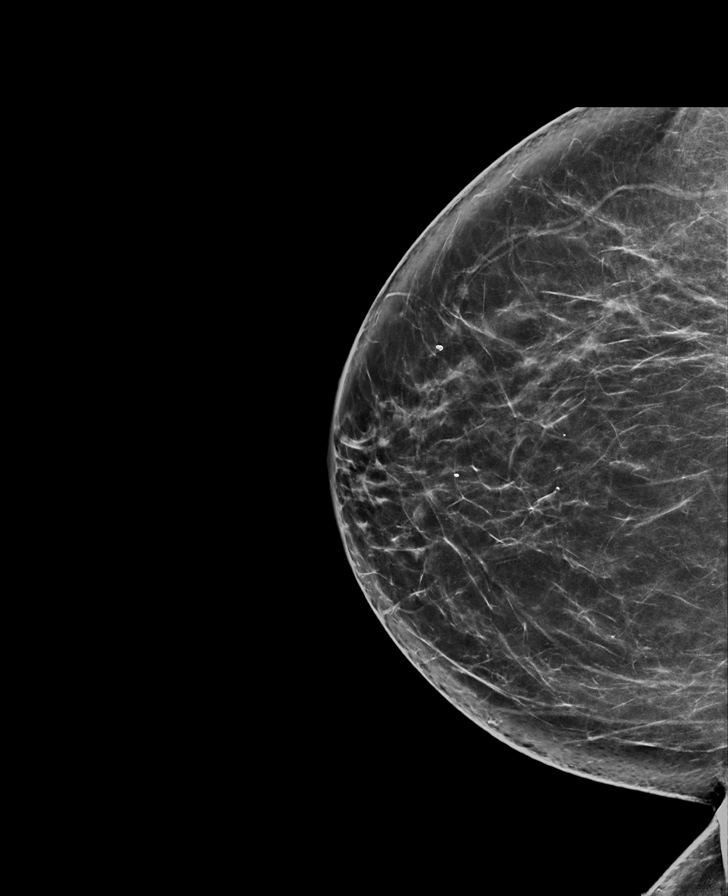

[R MLO synth-2D]
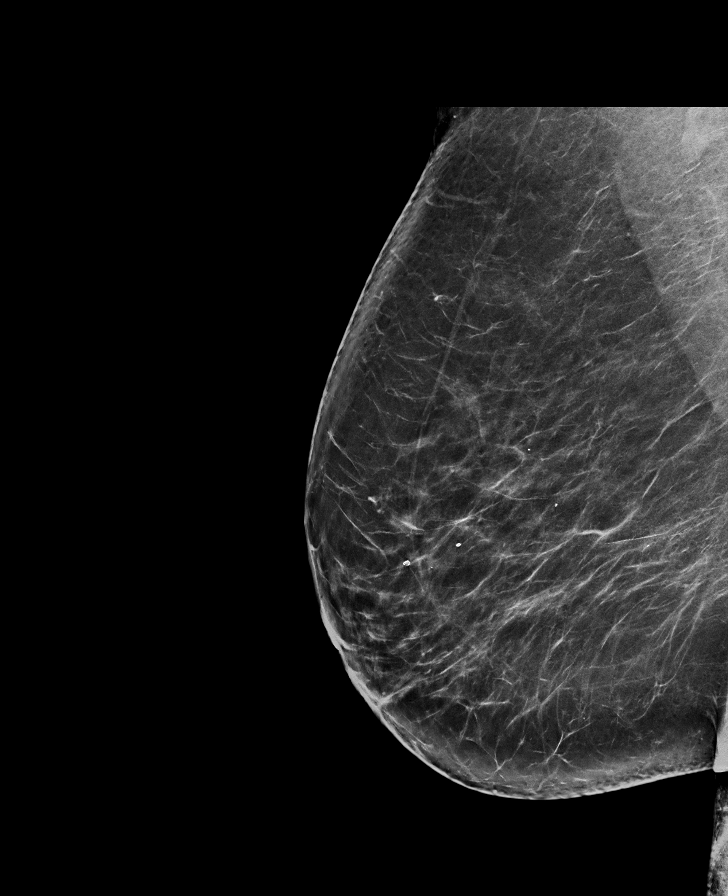

[L CC synth-2D]
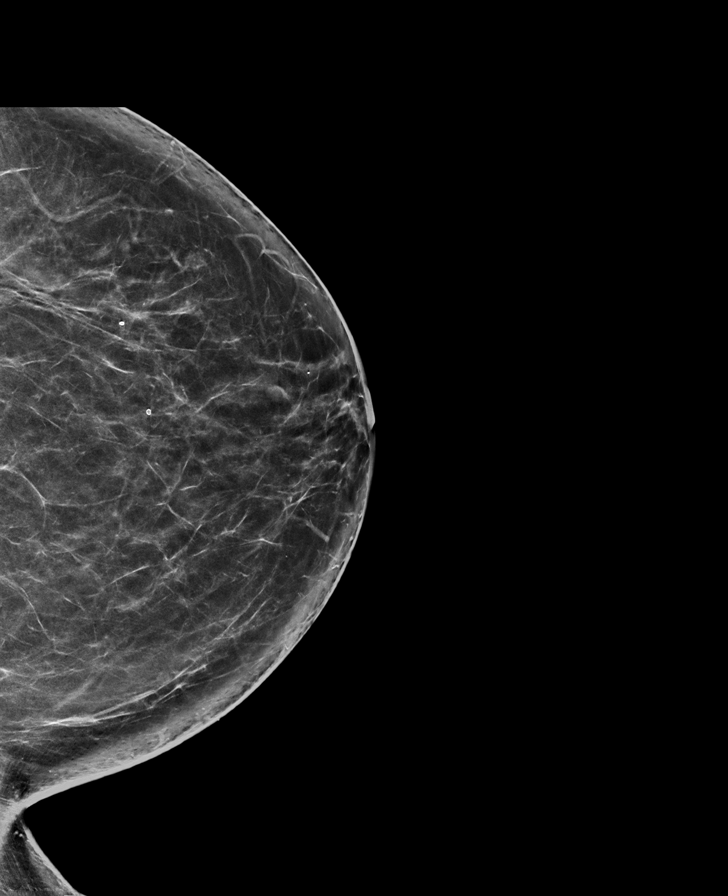

[L MLO synth-2D]
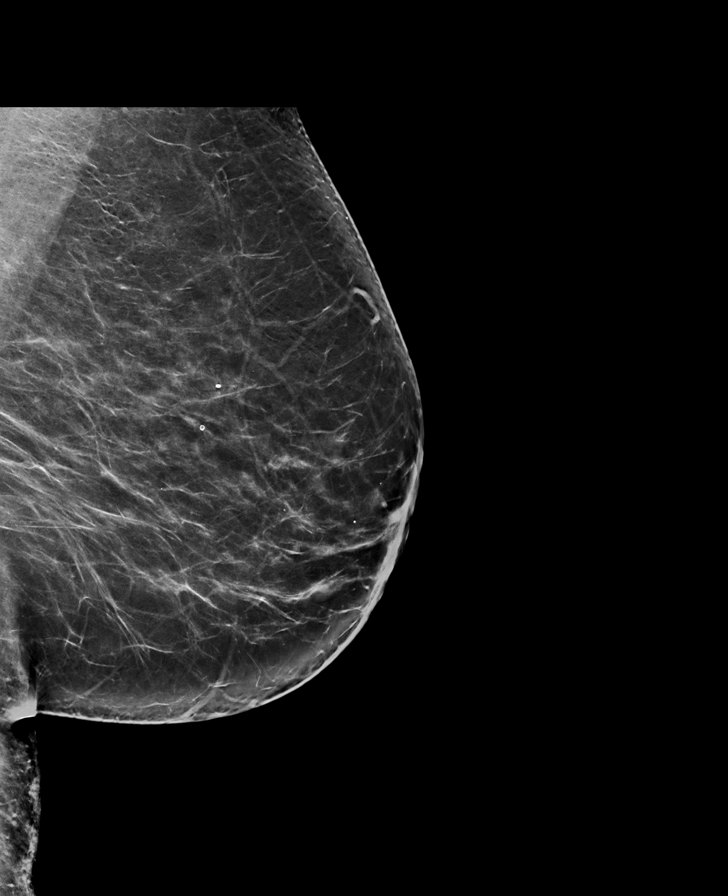

[R CC tomo · tomo slice 42/83.0]
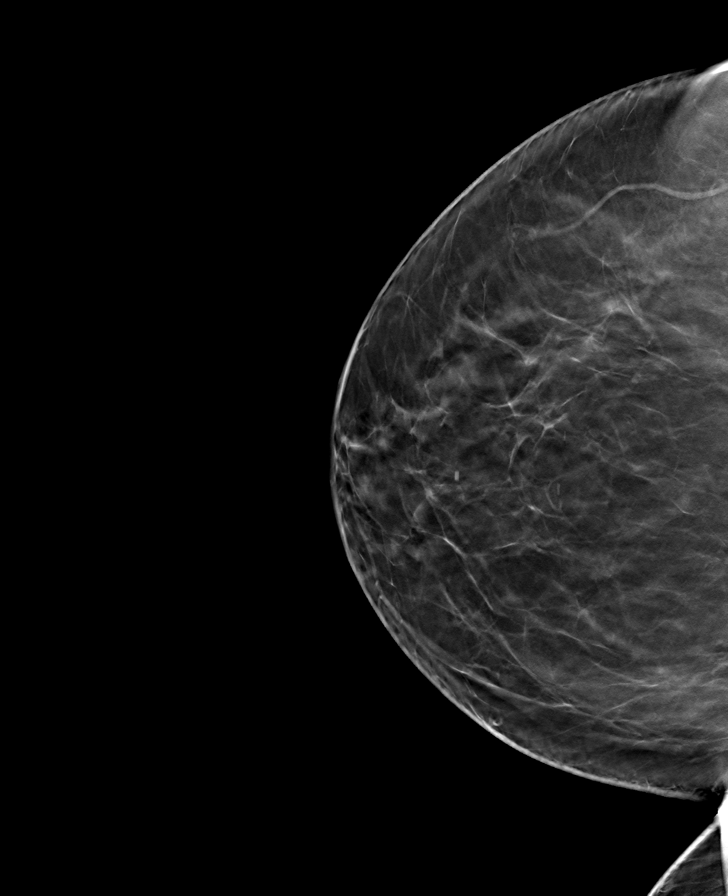

[L MLO tomo · tomo slice 45/89.0]
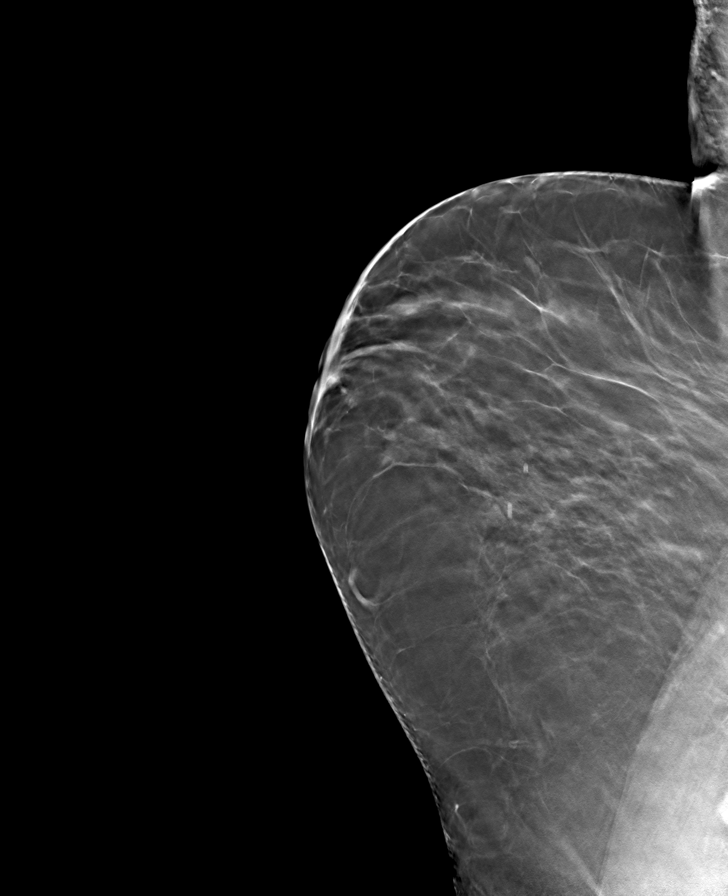

[R MLO tomo · tomo slice 47/93.0]
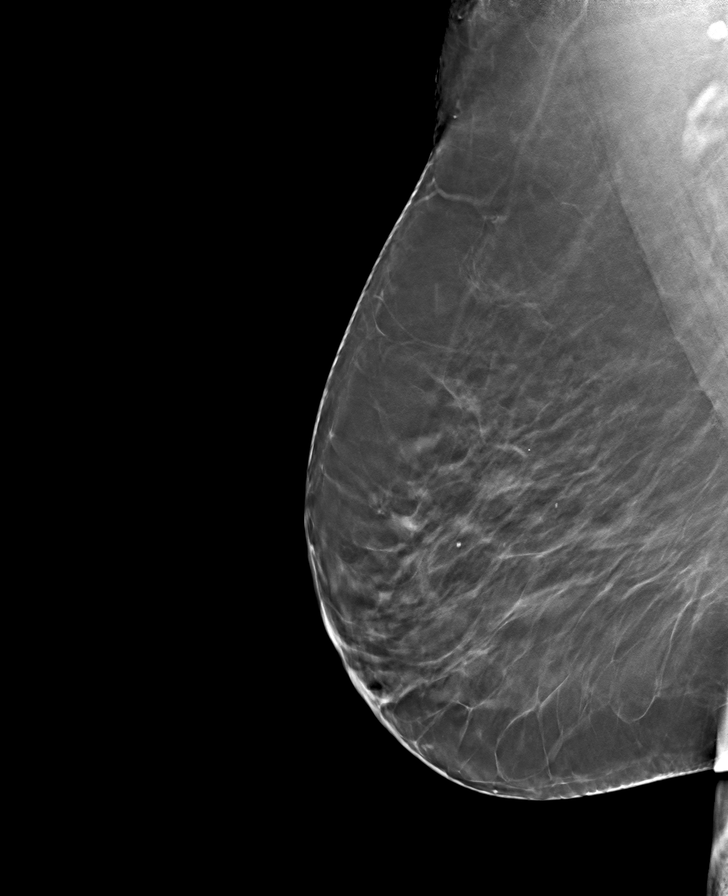

[L CC tomo · tomo slice 41/82.0]
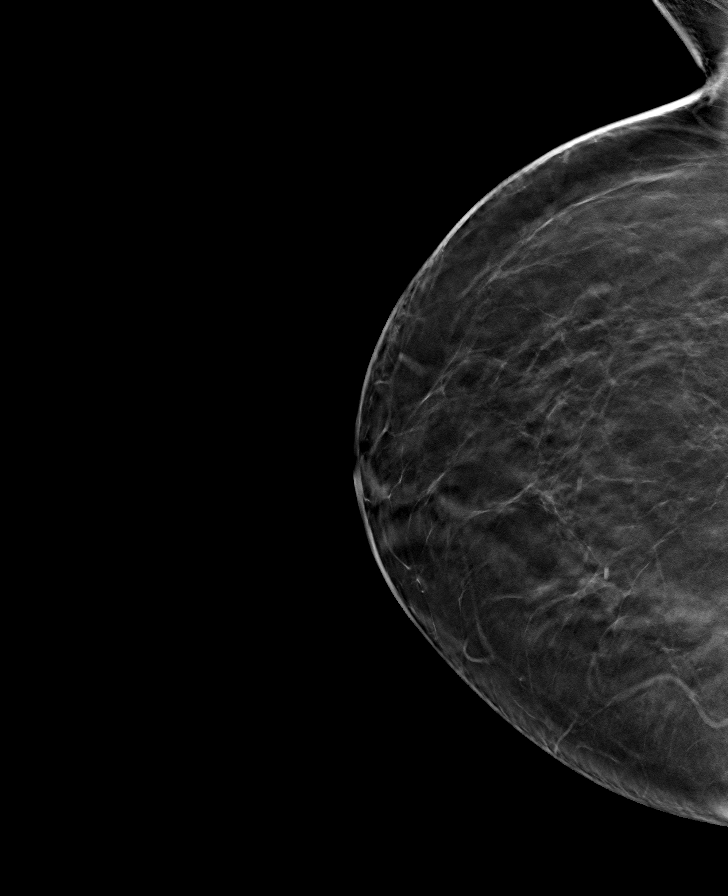

[8 of 24 positions shown; findings below may reference images not displayed]

ACR Breast Density Category b: There are scattered areas of
fibroglandular density.
FINDINGS: There are no findings suspicious for malignancy. Images were
processed with CAD.
IMPRESSION: No mammographic evidence of malignancy. A result letter of this
screening mammogram will be mailed directly to the patient.

RECOMMENDATION:
Screening mammogram in one year. (Code:[TQ])

BI-RADS CATEGORY  1: Negative.

## 2019-05-08 ENCOUNTER — Telehealth: Payer: Self-pay | Admitting: Family Medicine

## 2019-05-08 DIAGNOSIS — E049 Nontoxic goiter, unspecified: Secondary | ICD-10-CM

## 2019-05-08 DIAGNOSIS — R7303 Prediabetes: Secondary | ICD-10-CM

## 2019-05-08 DIAGNOSIS — E559 Vitamin D deficiency, unspecified: Secondary | ICD-10-CM

## 2019-05-08 DIAGNOSIS — Z0000 Encounter for general adult medical examination without abnormal findings: Secondary | ICD-10-CM

## 2019-05-08 NOTE — Telephone Encounter (Signed)
-----   Message from Cloyd Stagers, RT sent at 05/02/2019  2:25 PM EDT ----- Regarding: Lab Orders for Thursday 9.3.2020 Please place lab orders for Thursday 9.3.2020, office visit for physical on Tuesday 9.8.2020 Thank you, Dyke Maes RT(R)

## 2019-05-09 ENCOUNTER — Ambulatory Visit: Payer: Medicare Other

## 2019-05-09 ENCOUNTER — Other Ambulatory Visit: Payer: Self-pay

## 2019-05-09 ENCOUNTER — Other Ambulatory Visit (INDEPENDENT_AMBULATORY_CARE_PROVIDER_SITE_OTHER): Payer: Medicare Other

## 2019-05-09 DIAGNOSIS — E049 Nontoxic goiter, unspecified: Secondary | ICD-10-CM | POA: Diagnosis not present

## 2019-05-09 DIAGNOSIS — E559 Vitamin D deficiency, unspecified: Secondary | ICD-10-CM | POA: Diagnosis not present

## 2019-05-09 DIAGNOSIS — R7303 Prediabetes: Secondary | ICD-10-CM

## 2019-05-09 DIAGNOSIS — Z0000 Encounter for general adult medical examination without abnormal findings: Secondary | ICD-10-CM

## 2019-05-09 LAB — COMPREHENSIVE METABOLIC PANEL
ALT: 14 U/L (ref 0–35)
AST: 17 U/L (ref 0–37)
Albumin: 4.2 g/dL (ref 3.5–5.2)
Alkaline Phosphatase: 69 U/L (ref 39–117)
BUN: 19 mg/dL (ref 6–23)
CO2: 29 mEq/L (ref 19–32)
Calcium: 9.6 mg/dL (ref 8.4–10.5)
Chloride: 103 mEq/L (ref 96–112)
Creatinine, Ser: 0.78 mg/dL (ref 0.40–1.20)
GFR: 72.87 mL/min (ref >60.00)
Glucose, Bld: 119 mg/dL — ABNORMAL HIGH (ref 70–99)
Potassium: 4.7 mEq/L (ref 3.5–5.1)
Sodium: 139 mEq/L (ref 135–145)
Total Bilirubin: 0.5 mg/dL (ref 0.2–1.2)
Total Protein: 7.2 g/dL (ref 6.0–8.3)

## 2019-05-09 LAB — HEMOGLOBIN A1C: Hgb A1c MFr Bld: 6.4 % (ref 4.6–6.5)

## 2019-05-09 LAB — CBC WITH DIFFERENTIAL/PLATELET
Basophils Absolute: 0.1 10*3/uL (ref 0.0–0.1)
Basophils Relative: 0.6 % (ref 0.0–3.0)
Eosinophils Absolute: 0.2 10*3/uL (ref 0.0–0.7)
Eosinophils Relative: 2.8 % (ref 0.0–5.0)
HCT: 43.4 % (ref 36.0–46.0)
Hemoglobin: 14.7 g/dL (ref 12.0–15.0)
Lymphocytes Relative: 27.8 % (ref 12.0–46.0)
Lymphs Abs: 2.4 10*3/uL (ref 0.7–4.0)
MCHC: 33.9 g/dL (ref 30.0–36.0)
MCV: 94.7 fl (ref 78.0–100.0)
Monocytes Absolute: 0.7 10*3/uL (ref 0.1–1.0)
Monocytes Relative: 7.7 % (ref 3.0–12.0)
Neutro Abs: 5.2 10*3/uL (ref 1.4–7.7)
Neutrophils Relative %: 61.1 % (ref 43.0–77.0)
Platelets: 249 10*3/uL (ref 150.0–400.0)
RBC: 4.58 Mil/uL (ref 3.87–5.11)
RDW: 12.9 % (ref 11.5–15.5)
WBC: 8.6 10*3/uL (ref 4.0–10.5)

## 2019-05-09 LAB — VITAMIN D 25 HYDROXY (VIT D DEFICIENCY, FRACTURES): VITD: 30.1 ng/mL (ref 30.00–100.00)

## 2019-05-09 LAB — LIPID PANEL
Cholesterol: 179 mg/dL (ref 0–200)
HDL: 48.8 mg/dL (ref >39.00)
LDL Cholesterol: 105 mg/dL — ABNORMAL HIGH (ref 0–99)
NonHDL: 130.53
Total CHOL/HDL Ratio: 4
Triglycerides: 129 mg/dL (ref 0.0–149.0)
VLDL: 25.8 mg/dL (ref 0.0–40.0)

## 2019-05-09 LAB — TSH: TSH: 2.12 u[IU]/mL (ref 0.35–4.50)

## 2019-05-10 ENCOUNTER — Ambulatory Visit (INDEPENDENT_AMBULATORY_CARE_PROVIDER_SITE_OTHER): Payer: Medicare Other

## 2019-05-10 VITALS — Ht 64.0 in | Wt 220.0 lb

## 2019-05-10 DIAGNOSIS — Z0000 Encounter for general adult medical examination without abnormal findings: Secondary | ICD-10-CM | POA: Diagnosis not present

## 2019-05-10 NOTE — Progress Notes (Signed)
PCP notes:  Health Maintenance:  Colonoscopy and flu vaccine are due  Abnormal Screenings:  Depression screen: 7  Patient concerns:  Patient is having pains in feet, knees and hips that are becoming unmanageable. She is needing to take more aleve than usual. She has been having difficulty sleeping, but does not know why.  Nurse concerns:  None  Next PCP appt.: 05/14/2019 at 11:30

## 2019-05-10 NOTE — Patient Instructions (Signed)
[name] , Thank you for taking time to come for your Medicare Wellness Visit. I appreciate your ongoing commitment to your health goals. Please review the following plan we discussed and let me know if I can assist you in the future.   Screening recommendations/referrals: Colonoscopy: due Mammogram: 04/2019 Bone Density: 06/2017 Recommended yearly ophthalmology/optometry visit for glaucoma screening and checkup Recommended yearly dental visit for hygiene and checkup  Vaccinations: Influenza vaccine: 05/2018 Pneumococcal vaccine: 04/2015 Tdap vaccine: 11/2012 Shingles vaccine: discussed    Advanced directives: Please bring a copy of your POA (Power of [place]) and/or Living Will to your next appointment.    Conditions/risks identified: obesity  Next appointment: 05/14/2019 at 11:30   Preventive Care 70 Years and Older, Female Preventive care refers to lifestyle choices and visits with your health care provider that can promote health and wellness. What does preventive care include?  A yearly physical exam. This is also called an annual well check.  Dental exams once or twice a year.  Routine eye exams. Ask your health care provider how often you should have your eyes checked.  Personal lifestyle choices, including:  Daily care of your teeth and gums.  Regular physical activity.  Eating a healthy diet.  Avoiding tobacco and drug use.  Limiting alcohol use.  Practicing safe sex.  Taking low-dose aspirin every day.  Taking vitamin and mineral supplements as recommended by your health care provider. What happens during an annual well check? The services and screenings done by your health care provider during your annual well check will depend on your age, overall health, lifestyle risk factors, and family history of disease. Counseling  Your health care provider may ask you questions about your:  Alcohol use.  Tobacco use.  Drug use.  Emotional well-being.   Home and relationship well-being.  Sexual activity.  Eating habits.  History of falls.  Memory and ability to understand (cognition).  Work and work Statistician.  Reproductive health. Screening  You may have the following tests or measurements:  Height, weight, and BMI.  Blood pressure.  Lipid and cholesterol levels. These may be checked every 5 years, or more frequently if you are over 61 years old.  Skin check.  Lung cancer screening. You may have this screening every year starting at age 18 if you have a 30-pack-year history of smoking and currently smoke or have quit within the past 15 years.  Fecal occult blood test (FOBT) of the stool. You may have this test every year starting at age 74.  Flexible sigmoidoscopy or colonoscopy. You may have a sigmoidoscopy every 5 years or a colonoscopy every 10 years starting at age 70.  Hepatitis C blood test.  Hepatitis B blood test.  Sexually transmitted disease (STD) testing.  Diabetes screening. This is done by checking your blood sugar (glucose) after you have not eaten for a while (fasting). You may have this done every 1-3 years.  Bone density scan. This is done to screen for osteoporosis. You may have this done starting at age 32.  Mammogram. This may be done every 1-2 years. Talk to your health care provider about how often you should have regular mammograms. Talk with your health care provider about your test results, treatment options, and if necessary, the need for more tests. Vaccines  Your health care provider may recommend certain vaccines, such as:  Influenza vaccine. This is recommended every year.  Tetanus, diphtheria, and acellular pertussis (Tdap, Td) vaccine. You may need a Td booster  every 10 years.  Zoster vaccine. You may need this after age 18.  Pneumococcal 13-valent conjugate (PCV13) vaccine. One dose is recommended after age 67.  Pneumococcal polysaccharide (PPSV23) vaccine. One dose is  recommended after age 15. Talk to your health care provider about which screenings and vaccines you need and how often you need them. This information is not intended to replace advice given to you by your health care provider. Make sure you discuss any questions you have with your health care provider. Document Released: 09/18/2015 Document Revised: 05/11/2016 Document Reviewed: 06/23/2015 Elsevier Interactive Patient Education  2017 [place] Prevention in the Home Falls can cause injuries. They can happen to people of all ages. There are many things you can do to make your home safe and to help prevent falls. What can I do on the outside of my home?  Regularly fix the edges of walkways and driveways and fix any cracks.  Remove anything that might make you trip as you walk through a door, such as a raised step or threshold.  Trim any bushes or trees on the path to your home.  Use bright outdoor lighting.  Clear any walking paths of anything that might make someone trip, such as rocks or tools.  Regularly check to see if handrails are loose or broken. Make sure that both sides of any steps have handrails.  Any raised decks and porches should have guardrails on the edges.  Have any leaves, snow, or ice cleared regularly.  Use sand or salt on walking paths during winter.  Clean up any spills in your garage right away. This includes oil or grease spills. What can I do in the bathroom?  Use night lights.  Install grab bars by the toilet and in the tub and shower. Do not use towel bars as grab bars.  Use non-skid mats or decals in the tub or shower.  If you need to sit down in the shower, use a plastic, non-slip stool.  Keep the floor dry. Clean up any water that spills on the floor as soon as it happens.  Remove soap buildup in the tub or shower regularly.  Attach bath mats securely with double-sided non-slip rug tape.  Do not have throw rugs and other things on  the floor that can make you trip. What can I do in the bedroom?  Use night lights.  Make sure that you have a light by your bed that is easy to reach.  Do not use any sheets or blankets that are too big for your bed. They should not hang down onto the floor.  Have a firm chair that has side arms. You can use this for support while you get dressed.  Do not have throw rugs and other things on the floor that can make you trip. What can I do in the kitchen?  Clean up any spills right away.  Avoid walking on wet floors.  Keep items that you use a lot in easy-to-reach places.  If you need to reach something above you, use a strong step stool that has a grab bar.  Keep electrical cords out of the way.  Do not use floor polish or wax that makes floors slippery. If you must use wax, use non-skid floor wax.  Do not have throw rugs and other things on the floor that can make you trip. What can I do with my stairs?  Do not leave any items on the stairs.  Make  sure that there are handrails on both sides of the stairs and use them. Fix handrails that are broken or loose. Make sure that handrails are as long as the stairways.  Check any carpeting to make sure that it is firmly attached to the stairs. Fix any carpet that is loose or worn.  Avoid having throw rugs at the top or bottom of the stairs. If you do have throw rugs, attach them to the floor with carpet tape.  Make sure that you have a light switch at the top of the stairs and the bottom of the stairs. If you do not have them, ask someone to add them for you. What else can I do to help prevent falls?  Wear shoes that:  Do not have high heels.  Have rubber bottoms.  Are comfortable and fit you well.  Are closed at the toe. Do not wear sandals.  If you use a stepladder:  Make sure that it is fully opened. Do not climb a closed stepladder.  Make sure that both sides of the stepladder are locked into place.  Ask someone to  hold it for you, if possible.  Clearly mark and make sure that you can see:  Any grab bars or handrails.  First and last steps.  Where the edge of each step is.  Use tools that help you move around (mobility aids) if they are needed. These include:  Canes.  Walkers.  Scooters.  Crutches.  Turn on the lights when you go into a dark area. Replace any light bulbs as soon as they burn out.  Set up your furniture so you have a clear path. Avoid moving your furniture around.  If any of your floors are uneven, fix them.  If there are any pets around you, be aware of where they are.  Review your medicines with your doctor. Some medicines can make you feel dizzy. This can increase your chance of falling. Ask your doctor what other things that you can do to help prevent falls. This information is not intended to replace advice given to you by your health care provider. Make sure you discuss any questions you have with your health care provider. Document Released: 06/18/2009 Document Revised: 01/28/2016 Document Reviewed: 09/26/2014 Elsevier Interactive Patient Education  2017 [employer].

## 2019-05-10 NOTE — Progress Notes (Signed)
Subjective:   Deborah Carey is a 68 y.o. female who presents for Medicare Annual (Subsequent) preventive examination.  This visit type was conducted due to national recommendations for restrictions regarding the COVID-19 Pandemic (e.g. social distancing). This format is felt to be most appropriate for this patient at this time. All issues noted in this document were discussed and addressed. No physical exam was performed (except for noted visual exam findings with Video Visits). This patient, Deborah Carey, has given permission to perform this visit via telephone. Vital signs may be absent or patient reported.  Patient location:  At home  Nurse location:  At home     Review of Systems:  n/a Cardiac Risk Factors include: advanced age (>3men, >43 women);sedentary lifestyle;obesity (BMI >30kg/m2)     Objective:     Vitals: Ht 5\' 4"  (1.626 m) Comment: per patient  Wt 220 lb (99.8 kg) Comment: per patient  LMP 09/05/2008   BMI 37.76 kg/m   Body mass index is 37.76 kg/m.  Advanced Directives 05/10/2019 05/03/2018 05/01/2017 04/29/2016  Does Patient Have a Medical Advance Directive? Yes Yes Yes Yes  Type of Paramedic of [place];Living will Healthcare Power of [place];Living will [place];Living will  Does patient want to make changes to medical advance directive? - - - No - Patient declined  Copy of [place] in Chart? No - copy requested No - copy requested No - copy requested -    Tobacco Social History   Tobacco Use  Smoking Status Never Smoker  Smokeless Tobacco Never Used     Counseling given: Not Answered   Clinical Intake:  Pre-visit preparation completed: Yes  Pain : 0-10 Pain Score: (varies) Pain Type: Chronic pain Pain Location: Generalized(knees, feet and hips) Pain Descriptors / Indicators: Aching, Sharp Pain Onset: More than a month ago Pain Frequency: Constant  Pain Relieving Factors: aleve helps some Effect of Pain on Daily Activities: painful going up stairs  Pain Relieving Factors: aleve helps some  Nutritional Status: BMI > 30  Obese Nutritional Risks: None Diabetes: No  How often do you need to have someone help you when you read instructions, pamphlets, or other written materials from your doctor or pharmacy?: 1 - Never What is the last grade level you completed in school?: bachelor'e degree  Interpreter Needed?: No  Information entered by :: NAllen LPN  Past Medical History:  Diagnosis Date  . Allergy    allergic rhinitis  . Anxiety   . Infertility, female   . Osteoarthritis of both knees   . Post-menopausal bleeding    neg endo bx.   Past Surgical History:  Procedure Laterality Date  . cataract surgery  2011  . CESAREAN SECTION     times 2  . ENDOMETRIAL BIOPSY     neg for CA cells  . FACIAL COSMETIC SURGERY  2006   face lift  . FOOT SURGERY  1998   rt heel spur removal  . REFRACTIVE SURGERY  08/1999   Family History  Problem Relation Age of Onset  . COPD Brother   . Stroke Mother   . Hypertension Mother   . Osteoporosis Mother   . Alzheimer's disease Father   . Hypertension Father   . Osteoporosis Father   . Depression Sister   . Hypertension Sister   . Osteoporosis Sister   . Thyroid disease Sister   . Hypothyroidism Sister   . Parkinson's disease Sister   .  Osteoporosis Sister   . Hypothyroidism Sister   . Breast cancer Neg Hx    Social History   Socioeconomic History  . Marital status: Married    Spouse name: Not on file  . Number of children: Not on file  . Years of education: Not on file  . Highest education level: Not on file  Occupational History  . Occupation: retired  Scientific laboratory technician  . Financial resource strain: Not hard at all  . Food insecurity    Worry: Never true    Inability: Never true  . Transportation needs    Medical: No    Non-medical: No  Tobacco Use  . Smoking status:  Never Smoker  . Smokeless tobacco: Never Used  Substance and Sexual Activity  . Alcohol use: No    Alcohol/week: 0.0 standard drinks  . Drug use: No  . Sexual activity: Yes    Partners: Male    Birth control/protection: Post-menopausal  Lifestyle  . Physical activity    Days per week: 0 days    Minutes per session: 0 min  . Stress: Very much  Relationships  . Social Herbalist on phone: Not on file    Gets together: Not on file    Attends religious service: Not on file    Active member of club or organization: Not on file    Attends meetings of clubs or organizations: Not on file    Relationship status: Not on file  Other Topics Concern  . Not on file  Social History Narrative  . Not on file    Outpatient Encounter Medications as of 05/10/2019  Medication Sig  . albuterol (PROVENTIL HFA;VENTOLIN HFA) 108 (90 Base) MCG/ACT inhaler Inhale 1-2 puffs into the lungs every 6 (six) hours as needed for wheezing (or for cough).  . CALCIUM PO Take by mouth as needed.   . cetirizine (ZYRTEC) 10 MG tablet Take 10 mg by mouth daily. Taking as needed  . Cholecalciferol (VITAMIN D3) 125 MCG (5000 UT) CAPS Take by mouth.  . fluticasone (FLONASE) 50 MCG/ACT nasal spray Place 2 sprays into both nostrils as needed.  . Multiple Vitamins-Minerals (MULTIVITAMIN PO) Take by mouth as needed.   . naproxen sodium (ANAPROX) 220 MG tablet Take 220 mg by mouth as needed.  . Omega-3 Fatty Acids (FISH OIL PO) Take by mouth as needed.    No facility-administered encounter medications on file as of 05/10/2019.     Activities of Daily Living In your present state of health, do you have any difficulty performing the following activities: 05/10/2019  Hearing? N  Vision? N  Difficulty concentrating or making decisions? N  Walking or climbing stairs? Y  Comment due to knees,hips and feet  Dressing or bathing? N  Doing errands, shopping? N  Preparing Food and eating ? N  Using the Toilet? N  In the  past six months, have you accidently leaked urine? N  Do you have problems with loss of bowel control? N  Managing your Medications? N  Managing your Finances? N  Housekeeping or managing your Housekeeping? N  Some recent data might be hidden    Patient Care Team: Tower, Wynelle Fanny, MD as PCP - General Hollice Espy Phylis Bougie, CNM as Referring Physician (Certified Nurse Midwife) Conception Chancy, DDS as Referring Physician (Dentistry)    Assessment:   This is a routine wellness examination for Ker[place].  Exercise Activities and Dietary recommendations Current Exercise Habits: The patient does not participate in  regular exercise at present  Goals    . DIET - INCREASE WATER INTAKE     Starting 05/03/2018, I will continue to drink at least 6-8 glasses of water daily.     . Patient Stated     05/10/2019, no goals       Fall Risk Fall Risk  05/10/2019 05/03/2018 05/01/2017 04/29/2016 04/28/2015  Falls in the past year? 0 No No No Yes  Number falls in past yr: - - - - 1  Risk for fall due to : - - - - Impaired balance/gait  Follow up Falls evaluation completed;Falls prevention discussed - - - -   Is the patient's home free of loose throw rugs in walkways, pet beds, electrical cords, etc?   yes      Grab bars in the bathroom? no      Handrails on the stairs?   yes      Adequate lighting?   yes  Timed Get Up and Go performed: n/a  Depression Screen PHQ 2/9 Scores 05/10/2019 05/03/2018 05/01/2017 04/29/2016  PHQ - 2 Score 4 0 0 1  PHQ- 9 Score 7 0 4 -     Cognitive Function MMSE - Mini Mental State Exam 05/10/2019 05/03/2018 05/01/2017 04/29/2016  Orientation to time 4 5 5 5   Orientation to Place 5 5 5 5   Registration 3 3 3 3   Attention/ Calculation 5 0 0 0  Recall 3 3 3 3   Language- name 2 objects 0 0 0 0  Language- repeat 1 1 1 1   Language- follow 3 step command 0 3 3 3   Language- read & follow direction 0 0 0 0  Write a sentence 0 0 0 0  Copy design 0 0 0 0  Total score 21 20 20 20    Mini Cog   Mini-Cog screen was completed. Maximum score is 22. A value of 0 denotes this part of the MMSE was not completed or the patient failed this part of the Mini-Cog screening.       Immunization History  Administered Date(s) Administered  . Influenza Split 09/27/2011  . Influenza, High Dose Seasonal PF 07/18/2017  . Influenza,inj,Quad PF,6+ Mos 09/09/2015, 04/29/2016, 05/08/2018  . Pneumococcal Conjugate-13 04/28/2015  . Pneumococcal Polysaccharide-23 11/29/2013  . Td 01/21/2003  . Tdap 11/05/2012    Qualifies for Shingles Vaccine? yes  Screening Tests Health Maintenance  Topic Date Due  . COLONOSCOPY  10/06/2018  . INFLUENZA VACCINE  04/06/2019  . MAMMOGRAM  04/15/2020  . TETANUS/TDAP  11/06/2022  . DEXA SCAN  Completed  . Hepatitis C Screening  Completed  . PNA vac Low Risk Adult  Completed    Cancer Screenings: Lung: Low Dose CT Chest recommended if Age 67-80 years, 30 pack-year currently smoking OR have quit w/in 15years. Patient does not qualify. Breast:  Up to date on Mammogram? Yes   Up to date of Bone Density/Dexa? Yes Colorectal: due  Additional Screenings: : Hepatitis C Screening: 04/2016     Plan:    Patient has no goals set.   I have personally reviewed and noted the following in the patient's chart:   . Medical and social history . Use of alcohol, tobacco or illicit drugs  . Current medications and supplements . Functional ability and status . Nutritional status . Physical activity . Advanced directives . List of other physicians . Hospitalizations, surgeries, and ER visits in previous 12 months . Vitals . Screenings to include cognitive, depression, and falls . Referrals  and appointments  In addition, I have reviewed and discussed with patient certain preventive protocols, quality metrics, and best practice recommendations. A written personalized care plan for preventive services as well as general preventive health recommendations were provided  to patient.     Kellie Simmering, LPN  [id number]

## 2019-05-14 ENCOUNTER — Other Ambulatory Visit: Payer: Self-pay

## 2019-05-14 ENCOUNTER — Ambulatory Visit (INDEPENDENT_AMBULATORY_CARE_PROVIDER_SITE_OTHER): Payer: Medicare Other | Admitting: Family Medicine

## 2019-05-14 ENCOUNTER — Encounter: Payer: Self-pay | Admitting: Family Medicine

## 2019-05-14 VITALS — BP 135/82 | HR 98 | Temp 98.0°F | Ht 63.75 in | Wt 222.6 lb

## 2019-05-14 DIAGNOSIS — E7800 Pure hypercholesterolemia, unspecified: Secondary | ICD-10-CM

## 2019-05-14 DIAGNOSIS — Z23 Encounter for immunization: Secondary | ICD-10-CM | POA: Diagnosis not present

## 2019-05-14 DIAGNOSIS — M170 Bilateral primary osteoarthritis of knee: Secondary | ICD-10-CM

## 2019-05-14 DIAGNOSIS — I499 Cardiac arrhythmia, unspecified: Secondary | ICD-10-CM

## 2019-05-14 DIAGNOSIS — M8589 Other specified disorders of bone density and structure, multiple sites: Secondary | ICD-10-CM

## 2019-05-14 DIAGNOSIS — Z0000 Encounter for general adult medical examination without abnormal findings: Secondary | ICD-10-CM | POA: Diagnosis not present

## 2019-05-14 DIAGNOSIS — E041 Nontoxic single thyroid nodule: Secondary | ICD-10-CM

## 2019-05-14 DIAGNOSIS — Z1211 Encounter for screening for malignant neoplasm of colon: Secondary | ICD-10-CM

## 2019-05-14 DIAGNOSIS — R7303 Prediabetes: Secondary | ICD-10-CM

## 2019-05-14 DIAGNOSIS — E669 Obesity, unspecified: Secondary | ICD-10-CM | POA: Diagnosis not present

## 2019-05-14 DIAGNOSIS — E559 Vitamin D deficiency, unspecified: Secondary | ICD-10-CM

## 2019-05-14 NOTE — Progress Notes (Signed)
Subjective:    Patient ID: Deborah Carey, female    DOB: 04/07/1951, 68 y.o.   MRN: [id number]  HPI Here for health maintenance exam and to review chronic medical problems    Had amw on 9/4 Noted colonoscopy and flu vaccine due  PHQ score 7 (depression screen) She notes a lot of pain in feet/knees/hips (right side) -also sciatica   She wants to see ortho (not [facility])  She "is doing as well as anyone else" - unable to see family due to the pandemic  Brother is not doing well  She worries Husband moving his office to home- very hectic and he has been grumpy  A lot of life changes  Doing better now - does not want medication / needs to move more  Also other stressors not related   Colonoscopy 2/10  Ref done for next one   Flu vaccine -high dose/will do  Zoster status - shingrix is not covered  Has had shingles and not the vaccine   Mammogram 8/20 Self breast exam - no lumps or changes   dexa 10/18 -mild osteopenia Wants to wait on next dexa  Falls -none Fractures -none  Supplements taking her vit D  Exercise -working on  H/o low D, level of 30.1 now    Wt Readings from Last 3 Encounters:  05/14/19 222 lb 9 oz (101 kg)  05/10/19 220 lb (99.8 kg)  05/08/18 216 lb 8 oz (98.2 kg)  she had gone to wt watchers and lost 12 lb -then gained that back plus  Enjoys cooking -ate too much  [place] and swims every weekend - also jet ski  She used to go to [place] and walk in the shade - will get back to it   38.50 kg/m   BP Readings from Last 3 Encounters:  05/14/19 (!) 146/90  05/08/18 (!) 142/80  05/03/18 118/76   Pulse Readings from Last 3 Encounters:  05/14/19 98  05/08/18 83  05/03/18 83     H/o thyroid nodule  Stable on [place] a year ago  No problems  Lab Results  Component Value Date   TSH 2.12 05/09/2019    Prediabetes Lab Results  Component Value Date   HGBA1C 6.4 05/09/2019  up from 6.3 She craves sugar   Lab Results  Component  Value Date   CREATININE 0.78 05/09/2019   BUN 19 05/09/2019   NA 139 05/09/2019   K 4.7 05/09/2019   CL 103 05/09/2019   CO2 29 05/09/2019   Lab Results  Component Value Date   ALT 14 05/09/2019   AST 17 05/09/2019   ALKPHOS 69 05/09/2019   BILITOT 0.5 05/09/2019    Lab Results  Component Value Date   WBC 8.6 05/09/2019   HGB 14.7 05/09/2019   HCT 43.4 05/09/2019   MCV 94.7 05/09/2019   PLT 249.0 05/09/2019   Cholesterol Lab Results  Component Value Date   CHOL 179 05/09/2019   CHOL 182 05/03/2018   CHOL 190 05/01/2017   Lab Results  Component Value Date   HDL 48.80 05/09/2019   HDL 49.20 05/03/2018   HDL 43.10 05/01/2017   Lab Results  Component Value Date   LDLCALC 105 (H) 05/09/2019   LDLCALC 113 (H) 05/03/2018   LDLCALC 122 (H) 05/01/2017   Lab Results  Component Value Date   TRIG 129.0 05/09/2019   TRIG 102.0 05/03/2018   TRIG 124.0 05/01/2017   Lab Results  Component  Value Date   CHOLHDL 4 05/09/2019   CHOLHDL 4 05/03/2018   CHOLHDL 4 05/01/2017   Lab Results  Component Value Date   LDLDIRECT 141.8 10/30/2013  improved LDL    Patient Active Problem List   Diagnosis Date Noted  . Colon cancer screening 05/14/2019  . Vitamin D deficiency 05/08/2018  . [place] 06/20/2017  . H/O fracture of fibula 01/21/2014  . Osteopenia 01/07/2014  . Family history of osteoporosis 11/29/2013  . Estrogen deficiency 11/29/2013  . Hyperlipidemia 11/29/2013  . Encounter for Medicare annual wellness exam 10/29/2013  . Irregular heartbeat 10/07/2013  . Left thyroid nodule 09/26/2013  . Enlarged thyroid 09/16/2013  . Primary osteoarthritis of both knees 11/05/2012  . Other screening mammogram 09/27/2011  . Routine general medical examination at a health care facility 09/19/2011  . Obesity (BMI 35.0-39.9 without comorbidity) 06/06/2008  . Prediabetes 06/06/2008  . ANXIETY 04/17/2007  . ALLERGIC RHINITIS 04/17/2007   Past Medical History:  Diagnosis Date   . Allergy    allergic rhinitis  . Anxiety   . Infertility, female   . Osteoarthritis of both knees   . Post-menopausal bleeding    neg endo bx.   Past Surgical History:  Procedure Laterality Date  . cataract surgery  2011  . CESAREAN SECTION     times 2  . ENDOMETRIAL BIOPSY     neg for CA cells  . FACIAL COSMETIC SURGERY  2006   face lift  . FOOT SURGERY  1998   rt heel spur removal  . REFRACTIVE SURGERY  08/1999   Social History   Tobacco Use  . Smoking status: Never Smoker  . Smokeless tobacco: Never Used  Substance Use Topics  . Alcohol use: No    Alcohol/week: 0.0 standard drinks  . Drug use: No   Family History  Problem Relation Age of Onset  . COPD Brother   . Stroke Mother   . Hypertension Mother   . Osteoporosis Mother   . Alzheimer's disease Father   . Hypertension Father   . Osteoporosis Father   . Depression Sister   . Hypertension Sister   . Osteoporosis Sister   . Thyroid disease Sister   . Hypothyroidism Sister   . Parkinson's disease Sister   . Osteoporosis Sister   . Hypothyroidism Sister   . Breast cancer Neg Hx    No Known Allergies Current Outpatient Medications on File Prior to Visit  Medication Sig Dispense Refill  . albuterol (PROVENTIL HFA;VENTOLIN HFA) 108 (90 Base) MCG/ACT inhaler Inhale 1-2 puffs into the lungs every 6 (six) hours as needed for wheezing (or for cough). 1 Inhaler 1  . CALCIUM PO Take by mouth as needed.     . cetirizine (ZYRTEC) 10 MG tablet Take 10 mg by mouth daily. Taking as needed    . Cholecalciferol (VITAMIN D3) 125 MCG (5000 UT) CAPS Take by mouth.    . fluticasone (FLONASE) 50 MCG/ACT nasal spray Place 2 sprays into both nostrils as needed. 16 g 1  . Multiple Vitamins-Minerals (MULTIVITAMIN PO) Take by mouth as needed.     . naproxen sodium (ANAPROX) 220 MG tablet Take 220 mg by mouth as needed.    . Omega-3 Fatty Acids (FISH OIL PO) Take by mouth as needed.      No current facility-administered  medications on file prior to visit.      Review of Systems  Constitutional: Negative for activity change, appetite change, fatigue, fever and unexpected weight change.  HENT: Negative for congestion, ear pain, rhinorrhea, sinus pressure and sore throat.   Eyes: Negative for pain, redness and visual disturbance.  Respiratory: Negative for cough, shortness of breath and wheezing.   Cardiovascular: Negative for chest pain and palpitations.  Gastrointestinal: Negative for abdominal pain, blood in stool, constipation and diarrhea.  Endocrine: Negative for polydipsia and polyuria.  Genitourinary: Negative for dysuria, frequency and urgency.  Musculoskeletal: Positive for arthralgias. Negative for back pain and myalgias.  Skin: Negative for pallor and rash.  Allergic/Immunologic: Negative for environmental allergies.  Neurological: Negative for dizziness, syncope and headaches.  Hematological: Negative for adenopathy. Does not bruise/bleed easily.  Psychiatric/Behavioral: Negative for decreased concentration and dysphoric mood. The patient is not nervous/anxious.        Objective:   Physical Exam Constitutional:      General: She is not in acute distress.    Appearance: Normal appearance. She is well-developed. She is obese. She is not ill-appearing or diaphoretic.  HENT:     Head: Normocephalic and atraumatic.     Right Ear: Tympanic membrane, ear canal and external ear normal.     Left Ear: Tympanic membrane, ear canal and external ear normal.     Nose: Nose normal. No congestion.     Mouth/Throat:     Mouth: Mucous membranes are moist.     Pharynx: Oropharynx is clear. No posterior oropharyngeal erythema.  Eyes:     General: No scleral icterus.    Extraocular Movements: Extraocular movements intact.     Conjunctiva/sclera: Conjunctivae normal.     Pupils: Pupils are equal, round, and reactive to light.  Neck:     Musculoskeletal: Normal range of motion and neck supple. No neck  rigidity or muscular tenderness.     Thyroid: No thyromegaly.     Vascular: No carotid bruit or JVD.  Cardiovascular:     Rate and Rhythm: Normal rate and regular rhythm.     Pulses: Normal pulses.     Heart sounds: Normal heart sounds. No gallop.      Comments: Tachycardic at arrival Pulse in high 80s after sitting  Pulmonary:     Effort: Pulmonary effort is normal. No respiratory distress.     Breath sounds: Normal breath sounds. No wheezing.     Comments: Good air exch Chest:     Chest wall: No tenderness.  Abdominal:     General: Bowel sounds are normal. There is no distension or abdominal bruit.     Palpations: Abdomen is soft. There is no mass.     Tenderness: There is no abdominal tenderness.     Hernia: No hernia is present.  Genitourinary:    Comments: Breast exam: No mass, nodules, thickening, tenderness, bulging, retraction, inflamation, nipple discharge or skin changes noted.  No axillary or clavicular LA.     Musculoskeletal: Normal range of motion.        General: No tenderness.     Right lower leg: No edema.     Left lower leg: No edema.     Comments: Limited rom of knees  Foot pain with gait  Assist getting off the table No acute joint swelling No kyphosis   Lymphadenopathy:     Cervical: No cervical adenopathy.  Skin:    General: Skin is warm and dry.     Coloration: Skin is not pale.     Findings: No erythema or rash.     Comments: Solar lentigines diffusely Fair complexion   Neurological:  Mental Status: She is alert. Mental status is at baseline.     Cranial Nerves: No cranial nerve deficit.     Motor: No abnormal muscle tone.     Coordination: Coordination normal.     Gait: Gait normal.     Deep Tendon Reflexes: Reflexes are normal and symmetric. Reflexes normal.  Psychiatric:        Mood and Affect: Mood normal.           Assessment & Plan:   Problem List Items Addressed This Visit      Endocrine   Left thyroid nodule    Reviewed  [place] from a year ago- documented 23 y stability  No FNA recommended No symptoms  TSH is in the normal range         Musculoskeletal and Integument   Osteopenia    dexa 10/18  Will hold off on next one since she has other more pressing issues No falls or fx D is improved  Enc exercise         Other   Obesity (BMI 35.0-39.9 without comorbidity)    Discussed how this problem influences overall health and the risks it imposes  Reviewed plan for weight loss with lower calorie diet (via better food choices and also portion control or program like weight watchers) and exercise building up to or more than 30 minutes 5 days per week including some aerobic activity   Gained during pandemic Disc "addiction" to sweets - 2 weeks w/o sugar may help cravings  She is pre diabetic Disc opt for exercise-swimming is best      Prediabetes    Lab Results  Component Value Date   HGBA1C 6.4 05/09/2019   disc imp of low glycemic diet and wt loss to prevent DM2        Routine general medical examination at a health care facility - Primary    Reviewed health habits including diet and exercise and skin cancer prevention Reviewed appropriate screening tests for age  Also reviewed health mt list, fam hx and immunization status , as well as social and family history   See HPI Labs reviewed  Flu vaccine today  Colonoscopy ref done for screening  Orthopedic ref done to address knee oa and other joint pain  Disc imp of wt loss and low impact exercise  Enc her to get carbs from produce Pt wants to hold off a year for next dexa        Primary osteoarthritis of both knees    Now having pain in R foot and hip along with knee Also h/o plantar fasciitis in the past with surgery Ref made to orthopedic to address issues  Wt loss may help knee pain  Low impact exercise is best (she enjoys water)      Relevant Orders   Ambulatory referral to Orthopedic Surgery   Irregular heartbeat    Pulse of 98  upon arrival comes down to high 80s sitting Nl exam  No symptoms  BP: 135/82        Hyperlipidemia    Disc goals for lipids and reasons to control them Rev last labs with pt Rev low sat fat diet in detail LDL is improved at 105 Eating less fat in diet        Vitamin D deficiency    Vitamin D level is therapeutic with current supplementation Disc importance of this to bone and overall health Level is now up to 30- she will continue  supplementing       Colon cancer screening    Ref for 10 y screen colonoscopy  Wants to do this in [place] Ref done -told office will call her      Relevant Orders   Ambulatory referral to Gastroenterology    Other Visit Diagnoses    Need for influenza vaccination       Relevant Orders   Flu Vaccine QUAD High Dose(Fluad) (Completed)

## 2019-05-14 NOTE — Patient Instructions (Addendum)
The office will call you to set up a colonoscopy  Also for orthopedic visit   If you are interested in the new shingles vaccine (Shingrix) - call your local pharmacy to check on coverage and availability  If affordable, get on a wait list at your pharmacy to get the vaccine.  Try and get 30 minutes of exercise daily -in our out of the house   You are close to diabetes Weight loss helps  Less sugar as well  Try to get most of your carbohydrates from produce (with the exception of white potatoes)  Eat less bread/pasta/rice/snack foods/cereals/sweets and other items from the middle of the grocery store (processed carbs)

## 2019-05-14 NOTE — Assessment & Plan Note (Signed)
Reviewed [place] from a year ago- documented 5 y stability  No FNA recommended No symptoms  TSH is in the normal range

## 2019-05-14 NOTE — Assessment & Plan Note (Addendum)
Now having pain in R foot and hip along with knee Also h/o plantar fasciitis in the past with surgery Ref made to orthopedic to address issues  Wt loss may help knee pain  Low impact exercise is best (she enjoys water)

## 2019-05-14 NOTE — Assessment & Plan Note (Signed)
Discussed how this problem influences overall health and the risks it imposes  Reviewed plan for weight loss with lower calorie diet (via better food choices and also portion control or program like weight watchers) and exercise building up to or more than 30 minutes 5 days per week including some aerobic activity   Gained during pandemic Disc "addiction" to sweets - 2 weeks w/o sugar may help cravings  She is pre diabetic Disc opt for exercise-swimming is best

## 2019-05-14 NOTE — Assessment & Plan Note (Signed)
Ref for 10 y screen colonoscopy  Wants to do this in [place] Ref done -told office will call her

## 2019-05-14 NOTE — Assessment & Plan Note (Signed)
Vitamin D level is therapeutic with current supplementation Disc importance of this to bone and overall health Level is now up to 30- she will continue supplementing

## 2019-05-14 NOTE — Assessment & Plan Note (Signed)
Reviewed health habits including diet and exercise and skin cancer prevention Reviewed appropriate screening tests for age  Also reviewed health mt list, fam hx and immunization status , as well as social and family history   See HPI Labs reviewed  Flu vaccine today  Colonoscopy ref done for screening  Orthopedic ref done to address knee oa and other joint pain  Disc imp of wt loss and low impact exercise  Enc her to get carbs from produce Pt wants to hold off a year for next dexa

## 2019-05-14 NOTE — Assessment & Plan Note (Signed)
Lab Results  Component Value Date   HGBA1C 6.4 05/09/2019   disc imp of low glycemic diet and wt loss to prevent DM2

## 2019-05-14 NOTE — Assessment & Plan Note (Signed)
Pulse of 98 upon arrival comes down to high 80s sitting Nl exam  No symptoms  BP: 135/82

## 2019-05-14 NOTE — Assessment & Plan Note (Signed)
dexa 10/18  Will hold off on next one since she has other more pressing issues No falls or fx D is improved  Enc exercise

## 2019-05-14 NOTE — Assessment & Plan Note (Signed)
Disc goals for lipids and reasons to control them Rev last labs with pt Rev low sat fat diet in detail LDL is improved at 105 Eating less fat in diet

## 2019-05-27 ENCOUNTER — Encounter: Payer: Self-pay | Admitting: Gastroenterology

## 2019-06-13 ENCOUNTER — Other Ambulatory Visit: Payer: Self-pay

## 2019-06-13 ENCOUNTER — Ambulatory Visit (AMBULATORY_SURGERY_CENTER): Payer: Self-pay | Admitting: *Deleted

## 2019-06-13 VITALS — Temp 96.9°F | Ht 63.75 in | Wt 222.0 lb

## 2019-06-13 DIAGNOSIS — Z1211 Encounter for screening for malignant neoplasm of colon: Secondary | ICD-10-CM

## 2019-06-13 MED ORDER — SUPREP BOWEL PREP KIT 17.5-3.13-1.6 GM/177ML PO SOLN: 1.0000 | Freq: Once | ORAL | 0 refills | Status: AC

## 2019-06-13 NOTE — Progress Notes (Signed)
No egg or soy allergy known to patient   issues with past sedation with any surgeries  or procedures of PONV , no intubation problems  No diet pills per patient No home 02 use per patient  No blood thinners per patient  Pt denies issues with constipation  No A fib or A flutter  EMMI video sent to pt's e mail   Due to the COVID-19 pandemic we are asking patients to follow these guidelines. Please only bring one care partner. Please be aware that your care partner may wait in the car in the parking lot or if they feel like they will be too hot to wait in the car, they may wait in the lobby on the 4th floor. All care partners are required to wear a mask the entire time (we do not have any that we can provide them), they need to practice social distancing, and we will do a Covid check for all patient's and care partners when you arrive. Also we will check their temperature and your temperature. If the care partner waits in their car they need to stay in the parking lot the entire time and we will call them on their cell phone when the patient is ready for discharge so they can bring the car to the front of the building. Also all patient's will need to wear a mask into building.

## 2019-06-14 ENCOUNTER — Encounter: Payer: Self-pay | Admitting: Gastroenterology

## 2019-06-27 ENCOUNTER — Telehealth: Payer: Self-pay

## 2019-06-27 NOTE — Telephone Encounter (Signed)
Covid-19 screening questions   Do you now or have you had a fever in the last 14 days? NO   Do you have any respiratory symptoms of shortness of breath or cough now or in the last 14 days? NO  Do you have any family members or close contacts with diagnosed or suspected Covid-19 in the past 14 days? NO  Have you been tested for Covid-19 and found to be positive? NO        

## 2019-06-28 ENCOUNTER — Encounter: Payer: Self-pay | Admitting: Gastroenterology

## 2019-06-28 ENCOUNTER — Other Ambulatory Visit: Payer: Self-pay

## 2019-06-28 ENCOUNTER — Ambulatory Visit (AMBULATORY_SURGERY_CENTER): Payer: Medicare Other | Admitting: Gastroenterology

## 2019-06-28 VITALS — BP 127/66 | HR 67 | Temp 98.8°F | Resp 12 | Ht 63.0 in | Wt 222.0 lb

## 2019-06-28 DIAGNOSIS — Z1211 Encounter for screening for malignant neoplasm of colon: Secondary | ICD-10-CM | POA: Diagnosis not present

## 2019-06-28 DIAGNOSIS — D121 Benign neoplasm of appendix: Secondary | ICD-10-CM

## 2019-06-28 DIAGNOSIS — K5140 Inflammatory polyps of colon without complications: Secondary | ICD-10-CM

## 2019-06-28 DIAGNOSIS — D120 Benign neoplasm of cecum: Secondary | ICD-10-CM | POA: Diagnosis not present

## 2019-06-28 DIAGNOSIS — K388 Other specified diseases of appendix: Secondary | ICD-10-CM | POA: Diagnosis not present

## 2019-06-28 MED ORDER — SODIUM CHLORIDE 0.9 % IV SOLN: 500.0000 mL | Freq: Once | INTRAVENOUS | Status: DC

## 2019-06-28 NOTE — Op Note (Signed)
[facility] Patient Name: Deborah Carey Procedure Date: 06/28/2019 8:56 AM MRN: [id number] Endoscopist: Remo Lipps P. Havery Moros , MD Age: 68 Referring MD:  Date of Birth: 1951/02/23 Gender: Female Account #: [id number] Procedure:                Colonoscopy Indications:              Screening for colorectal malignant neoplasm Medicines:                Monitored Anesthesia Care Procedure:                Pre-Anesthesia Assessment:                           - Prior to the procedure, a History and Physical                            was performed, and patient medications and                            allergies were reviewed. The patient's tolerance of                            previous anesthesia was also reviewed. The risks                            and benefits of the procedure and the sedation                            options and risks were discussed with the patient.                            All questions were answered, and informed consent                            was obtained. Prior Anticoagulants: The patient has                            taken no previous anticoagulant or antiplatelet                            agents. ASA Grade Assessment: II - A patient with                            mild systemic disease. After reviewing the risks                            and benefits, the patient was deemed in                            satisfactory condition to undergo the procedure.                           After obtaining informed consent, the colonoscope  was passed under direct vision. Throughout the                            procedure, the patient's blood pressure, pulse, and                            oxygen saturations were monitored continuously. The                            Colonoscope was introduced through the anus and                            advanced to the the cecum, identified by                            appendiceal orifice and  ileocecal valve. The                            colonoscopy was performed without difficulty. The                            patient tolerated the procedure well. The quality                            of the bowel preparation was adequate. The                            ileocecal valve, appendiceal orifice, and rectum                            were photographed. Scope In: 9:02:02 AM Scope Out: 9:21:00 AM Scope Withdrawal Time: 0 hours 14 minutes 22 seconds  Total Procedure Duration: 0 hours 18 minutes 58 seconds  Findings:                 The perianal and digital rectal examinations were                            normal.                           A few small-mouthed diverticula were found in the                            sigmoid colon.                           A 3 mm polyp was found in the cecum. The polyp was                            sessile. The polyp was removed with a cold snare.                            Resection and retrieval were complete.  A possible flat polypoid lesion was found at the                            appendiceal orifice. The area was slightly                            protuberant and not large but clear margin not                            seen, unclear if this is a polyp if invading the                            AO. Suspect this may likely be normal fold /                            variant, but biopsies were taken with a cold                            forceps for histology to ensure no sessile serrated                            lesion.                           Internal hemorrhoids were found during                            retroflexion. The hemorrhoids were small.                           The exam was otherwise without abnormality. Complications:            No immediate complications. Estimated blood loss:                            Minimal. Estimated Blood Loss:     Estimated blood loss was minimal. Impression:                - Diverticulosis in the sigmoid colon.                           - One 3 mm polyp in the cecum, removed with a cold                            snare. Resected and retrieved.                           - Likely normal variant vs. possible flat polypoid                            lesion at the appendiceal orifice. Biopsied.                           - Internal hemorrhoids.                           -  The examination was otherwise normal. Recommendation:           - Patient has a contact number available for                            emergencies. The signs and symptoms of potential                            delayed complications were discussed with the                            patient. Return to normal activities tomorrow.                            Written discharge instructions were provided to the                            patient.                           - Resume previous diet.                           - Continue present medications.                           - Await pathology results. Remo Lipps P. Armbruster, MD 06/28/2019 9:26:51 AM This report has been signed electronically.

## 2019-06-28 NOTE — Progress Notes (Signed)
Called to room to assist during endoscopic procedure.  Patient ID and intended procedure confirmed with present staff. Received instructions for my participation in the procedure from the performing physician.  

## 2019-06-28 NOTE — Patient Instructions (Signed)
   Handouts on polyps ,diverticulosis ,& hemorrhoids given to you today   Await pathology results on polyps removed     YOU HAD AN ENDOSCOPIC PROCEDURE TODAY AT [facility]:   Refer to the procedure report that was given to you for any specific questions about what was found during the examination.  If the procedure report does not answer your questions, please call your gastroenterologist to clarify.  If you requested that your care partner not be given the details of your procedure findings, then the procedure report has been included in a sealed envelope for you to review at your convenience later.  YOU SHOULD EXPECT: Some feelings of bloating in the abdomen. Passage of more gas than usual.  Walking can help get rid of the air that was put into your GI tract during the procedure and reduce the bloating. If you had a lower endoscopy (such as a colonoscopy or flexible sigmoidoscopy) you may notice spotting of blood in your stool or on the toilet paper. If you underwent a bowel prep for your procedure, you may not have a normal bowel movement for a few days.  Please Note:  You might notice some irritation and congestion in your nose or some drainage.  This is from the oxygen used during your procedure.  There is no need for concern and it should clear up in a day or so.  SYMPTOMS TO REPORT IMMEDIATELY:   Following lower endoscopy (colonoscopy or flexible sigmoidoscopy):  Excessive amounts of blood in the stool  Significant tenderness or worsening of abdominal pains  Swelling of the abdomen that is new, acute  Fever of 100F or higher    For urgent or emergent issues, a gastroenterologist can be reached at any hour by calling [phone].   DIET:  We do recommend a small meal at first, but then you may proceed to your regular diet.  Drink plenty of fluids but you should avoid alcoholic beverages for 24 hours.  ACTIVITY:  You should plan to take it easy for the rest of  today and you should NOT DRIVE or use heavy machinery until tomorrow (because of the sedation medicines used during the test).    FOLLOW UP: Our staff will call the number listed on your records 48-72 hours following your procedure to check on you and address any questions or concerns that you may have regarding the information given to you following your procedure. If we do not reach you, we will leave a message.  We will attempt to reach you two times.  During this call, we will ask if you have developed any symptoms of COVID 19. If you develop any symptoms (ie: fever, flu-like symptoms, shortness of breath, cough etc.) before then, please call [phone].  If you test positive for Covid 19 in the 2 weeks post procedure, please call and report this information to [place].    If any biopsies were taken you will be contacted by phone or by letter within the next 1-3 weeks.  Please call us at [phone] if you have not heard about the biopsies in 3 weeks.    SIGNATURES/CONFIDENTIALITY: You and/or your care partner have signed paperwork which will be entered into your electronic medical record.  These signatures attest to the fact that that the information above on your After Visit Summary has been reviewed and is understood.  Full responsibility of the confidentiality of this discharge information lies with you and/or your care-partner.

## 2019-06-28 NOTE — Progress Notes (Signed)
A and O x3. Report to RN. Tolerated MAC anesthesia well.

## 2019-07-02 ENCOUNTER — Telehealth: Payer: Self-pay

## 2019-07-02 NOTE — Telephone Encounter (Signed)
  Follow up Call-  Call back number 06/28/2019  Post procedure Call Back phone  # [phone]  Permission to leave phone message Yes  Some recent data might be hidden     Patient questions:  Do you have a fever, pain , or abdominal swelling? No. Pain Score  0 *  Have you tolerated food without any problems? Yes.    Have you been able to return to your normal activities? Yes.    Do you have any questions about your discharge instructions: Diet   No. Medications  No. Follow up visit  No.  Do you have questions or concerns about your Care? No.  Actions: * If pain score is 4 or above: No action needed, pain <4.  1. Have you developed a fever since your procedure? No  2.   Have you had an respiratory symptoms (SOB or cough) since your procedure? No  3.   Have you tested positive for COVID 19 since your procedure No  4.   Have you had any family members/close contacts diagnosed with the COVID 19 since your procedure?  No   If yes to any of these questions please route to Joylene John, RN and Alphonsa Gin, RN.

## 2019-07-04 ENCOUNTER — Encounter: Payer: Self-pay | Admitting: Gastroenterology

## 2019-07-17 ENCOUNTER — Encounter: Payer: Self-pay | Admitting: Family Medicine

## 2019-11-22 DIAGNOSIS — H04123 Dry eye syndrome of bilateral lacrimal glands: Secondary | ICD-10-CM | POA: Diagnosis not present

## 2020-05-20 ENCOUNTER — Other Ambulatory Visit: Payer: Self-pay

## 2020-05-20 ENCOUNTER — Ambulatory Visit (INDEPENDENT_AMBULATORY_CARE_PROVIDER_SITE_OTHER): Payer: Medicare PPO

## 2020-05-20 DIAGNOSIS — Z0000 Encounter for general adult medical examination without abnormal findings: Secondary | ICD-10-CM | POA: Diagnosis not present

## 2020-05-20 NOTE — Patient Instructions (Signed)
Deborah Carey , Thank you for taking time to come for your Medicare Wellness Visit. I appreciate your ongoing commitment to your health goals. Please review the following plan we discussed and let me know if I can assist you in the future.   Screening recommendations/referrals: Colonoscopy: Up to date, completed 06/28/2019, due 06/2026 Mammogram: due, will call and set this up soon  Bone Density: due, will call and set this up soon  Recommended yearly ophthalmology/optometry visit for glaucoma screening and checkup Recommended yearly dental visit for hygiene and checkup  Vaccinations: Influenza vaccine: due, will discuss when to get this with provider Pneumococcal vaccine: Completed series Tdap vaccine: Up to date, completed 11/05/2012, due 11/2022 Shingles vaccine: due, check with your insurance regarding coverage   Covid-19:Completed series  Advanced directives: Please bring a copy of your POA (Power of Liberal) and/or Living Will to your next appointment.   Conditions/risks identified: hyperlipidemia  Next appointment: Follow up in one year for your annual wellness visit    Preventive Care 65 Years and Older, Female Preventive care refers to lifestyle choices and visits with your health care provider that can promote health and wellness. What does preventive care include?  A yearly physical exam. This is also called an annual well check.  Dental exams once or twice a year.  Routine eye exams. Ask your health care provider how often you should have your eyes checked.  Personal lifestyle choices, including:  Daily care of your teeth and gums.  Regular physical activity.  Eating a healthy diet.  Avoiding tobacco and drug use.  Limiting alcohol use.  Practicing safe sex.  Taking low-dose aspirin every day.  Taking vitamin and mineral supplements as recommended by your health care provider. What happens during an annual well check? The services and screenings done by your  health care provider during your annual well check will depend on your age, overall health, lifestyle risk factors, and family history of disease. Counseling  Your health care provider may ask you questions about your:  Alcohol use.  Tobacco use.  Drug use.  Emotional well-being.  Home and relationship well-being.  Sexual activity.  Eating habits.  History of falls.  Memory and ability to understand (cognition).  Work and work Statistician.  Reproductive health. Screening  You may have the following tests or measurements:  Height, weight, and BMI.  Blood pressure.  Lipid and cholesterol levels. These may be checked every 5 years, or more frequently if you are over 48 years old.  Skin check.  Lung cancer screening. You may have this screening every year starting at age 48 if you have a 30-pack-year history of smoking and currently smoke or have quit within the past 15 years.  Fecal occult blood test (FOBT) of the stool. You may have this test every year starting at age 29.  Flexible sigmoidoscopy or colonoscopy. You may have a sigmoidoscopy every 5 years or a colonoscopy every 10 years starting at age 25.  Hepatitis C blood test.  Hepatitis B blood test.  Sexually transmitted disease (STD) testing.  Diabetes screening. This is done by checking your blood sugar (glucose) after you have not eaten for a while (fasting). You may have this done every 1-3 years.  Bone density scan. This is done to screen for osteoporosis. You may have this done starting at age 4.  Mammogram. This may be done every 1-2 years. Talk to your health care provider about how often you should have regular mammograms. Talk with your  health care provider about your test results, treatment options, and if necessary, the need for more tests. Vaccines  Your health care provider may recommend certain vaccines, such as:  Influenza vaccine. This is recommended every year.  Tetanus, diphtheria, and  acellular pertussis (Tdap, Td) vaccine. You may need a Td booster every 10 years.  Zoster vaccine. You may need this after age 44.  Pneumococcal 13-valent conjugate (PCV13) vaccine. One dose is recommended after age 66.  Pneumococcal polysaccharide (PPSV23) vaccine. One dose is recommended after age 65. Talk to your health care provider about which screenings and vaccines you need and how often you need them. This information is not intended to replace advice given to you by your health care provider. Make sure you discuss any questions you have with your health care provider. Document Released: 09/18/2015 Document Revised: 05/11/2016 Document Reviewed: 06/23/2015 Elsevier Interactive Patient Education  2017 [place] Prevention in the Home Falls can cause injuries. They can happen to people of all ages. There are many things you can do to make your home safe and to help prevent falls. What can I do on the outside of my home?  Regularly fix the edges of walkways and driveways and fix any cracks.  Remove anything that might make you trip as you walk through a door, such as a raised step or threshold.  Trim any bushes or trees on the path to your home.  Use bright outdoor lighting.  Clear any walking paths of anything that might make someone trip, such as rocks or tools.  Regularly check to see if handrails are loose or broken. Make sure that both sides of any steps have handrails.  Any raised decks and porches should have guardrails on the edges.  Have any leaves, snow, or ice cleared regularly.  Use sand or salt on walking paths during winter.  Clean up any spills in your garage right away. This includes oil or grease spills. What can I do in the bathroom?  Use night lights.  Install grab bars by the toilet and in the tub and shower. Do not use towel bars as grab bars.  Use non-skid mats or decals in the tub or shower.  If you need to sit down in the shower, use  a plastic, non-slip stool.  Keep the floor dry. Clean up any water that spills on the floor as soon as it happens.  Remove soap buildup in the tub or shower regularly.  Attach bath mats securely with double-sided non-slip rug tape.  Do not have throw rugs and other things on the floor that can make you trip. What can I do in the bedroom?  Use night lights.  Make sure that you have a light by your bed that is easy to reach.  Do not use any sheets or blankets that are too big for your bed. They should not hang down onto the floor.  Have a firm chair that has side arms. You can use this for support while you get dressed.  Do not have throw rugs and other things on the floor that can make you trip. What can I do in the kitchen?  Clean up any spills right away.  Avoid walking on wet floors.  Keep items that you use a lot in easy-to-reach places.  If you need to reach something above you, use a strong step stool that has a grab bar.  Keep electrical cords out of the way.  Do not use  floor polish or wax that makes floors slippery. If you must use wax, use non-skid floor wax.  Do not have throw rugs and other things on the floor that can make you trip. What can I do with my stairs?  Do not leave any items on the stairs.  Make sure that there are handrails on both sides of the stairs and use them. Fix handrails that are broken or loose. Make sure that handrails are as long as the stairways.  Check any carpeting to make sure that it is firmly attached to the stairs. Fix any carpet that is loose or worn.  Avoid having throw rugs at the top or bottom of the stairs. If you do have throw rugs, attach them to the floor with carpet tape.  Make sure that you have a light switch at the top of the stairs and the bottom of the stairs. If you do not have them, ask someone to add them for you. What else can I do to help prevent falls?  Wear shoes that:  Do not have high heels.  Have  rubber bottoms.  Are comfortable and fit you well.  Are closed at the toe. Do not wear sandals.  If you use a stepladder:  Make sure that it is fully opened. Do not climb a closed stepladder.  Make sure that both sides of the stepladder are locked into place.  Ask someone to hold it for you, if possible.  Clearly mark and make sure that you can see:  Any grab bars or handrails.  First and last steps.  Where the edge of each step is.  Use tools that help you move around (mobility aids) if they are needed. These include:  Canes.  Walkers.  Scooters.  Crutches.  Turn on the lights when you go into a dark area. Replace any light bulbs as soon as they burn out.  Set up your furniture so you have a clear path. Avoid moving your furniture around.  If any of your floors are uneven, fix them.  If there are any pets around you, be aware of where they are.  Review your medicines with your doctor. Some medicines can make you feel dizzy. This can increase your chance of falling. Ask your doctor what other things that you can do to help prevent falls. This information is not intended to replace advice given to you by your health care provider. Make sure you discuss any questions you have with your health care provider. Document Released: 06/18/2009 Document Revised: 01/28/2016 Document Reviewed: 09/26/2014 Elsevier Interactive Patient Education  2017 [employer].

## 2020-05-20 NOTE — Progress Notes (Signed)
Subjective:   Deborah Carey is a 69 y.o. female who presents for Medicare Annual (Subsequent) preventive examination.  Review of Systems: N/A     I connected with the patient today by telephone and verified that I am speaking with the correct person using two identifiers. Location patient: home Location nurse: work Persons participating in the telephone visit: patient, nurse.   I discussed the limitations, risks, security and privacy concerns of performing an evaluation and management service by telephone and the availability of in person appointments. I also discussed with the patient that there may be a patient responsible charge related to this service. The patient expressed understanding and verbally consented to this telephonic visit.        Cardiac Risk Factors include: advanced age (>27men, >37 women);Other (see comment), Risk factor comments: hyperlipidemia     Objective:    Today's Vitals   There is no height or weight on file to calculate BMI.  Advanced Directives 05/20/2020 05/10/2019 05/03/2018 05/01/2017 04/29/2016  Does Patient Have a Medical Advance Directive? Yes Yes Yes Yes Yes  Type of Paramedic of [place];Living will [place];Living will Healthcare Power of [place];Living will [place];Living will  Does patient want to make changes to medical advance directive? - - - - No - Patient declined  Copy of [place] in Chart? No - copy requested No - copy requested No - copy requested No - copy requested -    Current Medications (verified) Outpatient Encounter Medications as of 05/20/2020  Medication Sig  . CALCIUM PO Take by mouth as needed.   . cetirizine (ZYRTEC) 10 MG tablet Take 10 mg by mouth daily. Taking as needed  . Cholecalciferol (VITAMIN D3) 125 MCG (5000 UT) CAPS Take by mouth.  . Multiple Vitamins-Minerals (MULTIVITAMIN PO) Take by mouth as  needed.   . naproxen sodium (ANAPROX) 220 MG tablet Take 220 mg by mouth as needed.  . Omega-3 Fatty Acids (FISH OIL PO) Take by mouth as needed.   Marland Kitchen albuterol (PROVENTIL HFA;VENTOLIN HFA) 108 (90 Base) MCG/ACT inhaler Inhale 1-2 puffs into the lungs every 6 (six) hours as needed for wheezing (or for cough). (Patient not taking: Reported on 06/13/2019)  . fluticasone (FLONASE) 50 MCG/ACT nasal spray Place 2 sprays into both nostrils as needed. (Patient not taking: Reported on 06/13/2019)   No facility-administered encounter medications on file as of 05/20/2020.    Allergies (verified) Patient has no known allergies.   History: Past Medical History:  Diagnosis Date  . Allergy    allergic rhinitis  . Anxiety   . Cataract    removed both eyes   . Infertility, female   . Osteoarthritis of both knees    OA   . PONV (postoperative nausea and vomiting)   . Post-menopausal bleeding    neg endo bx.   Past Surgical History:  Procedure Laterality Date  . cataract surgery  2011  . CESAREAN SECTION     times 2  . COLONOSCOPY  2010   hems   . ENDOMETRIAL BIOPSY     neg for CA cells  . FACIAL COSMETIC SURGERY  2006   face lift  . FOOT SURGERY  1998   rt heel spur removal  . REFRACTIVE SURGERY  08/1999   Family History  Problem Relation Age of Onset  . COPD Brother   . Stroke Mother   . Hypertension Mother   .  Osteoporosis Mother   . Alzheimer's disease Father   . Hypertension Father   . Osteoporosis Father   . Depression Sister   . Hypertension Sister   . Osteoporosis Sister   . Thyroid disease Sister   . Hypothyroidism Sister   . Parkinson's disease Sister   . Osteoporosis Sister   . Hypothyroidism Sister   . Breast cancer Neg Hx   . Colon polyps Neg Hx   . Colon cancer Neg Hx   . Esophageal cancer Neg Hx   . Rectal cancer Neg Hx   . Stomach cancer Neg Hx    Social History   Socioeconomic History  . Marital status: Married    Spouse name: Not on file  . Number of  children: Not on file  . Years of education: Not on file  . Highest education level: Not on file  Occupational History  . Occupation: retired  Tobacco Use  . Smoking status: Never Smoker  . Smokeless tobacco: Never Used  Vaping Use  . Vaping Use: Never used  Substance and Sexual Activity  . Alcohol use: No    Alcohol/week: 0.0 standard drinks  . Drug use: No  . Sexual activity: Yes    Partners: Male    Birth control/protection: Post-menopausal  Other Topics Concern  . Not on file  Social History Narrative  . Not on file   Social Determinants of Health   Financial Resource Strain: Low Risk   . Difficulty of Paying Living Expenses: Not hard at all  Food Insecurity: No Food Insecurity  . Worried About Charity fundraiser in the Last Year: Never true  . Ran Out of Food in the Last Year: Never true  Transportation Needs: No Transportation Needs  . Lack of Transportation (Medical): No  . Lack of Transportation (Non-Medical): No  Physical Activity: Inactive  . Days of Exercise per Week: 0 days  . Minutes of Exercise per Session: 0 min  Stress: No Stress Concern Present  . Feeling of Stress : Not at all  Social Connections:   . Frequency of Communication with Friends and Family: Not on file  . Frequency of Social Gatherings with Friends and Family: Not on file  . Attends Religious Services: Not on file  . Active Member of Clubs or Organizations: Not on file  . Attends Archivist Meetings: Not on file  . Marital Status: Not on file    Tobacco Counseling Counseling given: Not Answered   Clinical Intake:  Pre-visit preparation completed: Yes  Pain : No/denies pain     Nutritional Risks: None Diabetes: No  How often do you need to have someone help you when you read instructions, pamphlets, or other written materials from your doctor or pharmacy?: 1 - Never What is the last grade level you completed in school?: college  Diabetic: no Nutrition Risk  Assessment:  Has the patient had any N/V/D within the last 2 months?  No  Does the patient have any non-healing wounds?  No  Has the patient had any unintentional weight loss or weight gain?  No   Diabetes:  Is the patient diabetic?  No  If diabetic, was a CBG obtained today?  N/A Did the patient bring in their glucometer from home?  N/A How often do you monitor your CBG's? N/A.   Financial Strains and Diabetes Management:  Are you having any financial strains with the device, your supplies or your medication? N/A.  Does the patient want to be  seen by Chronic Care Management for management of their diabetes?  N/A Would the patient like to be referred to a Nutritionist or for Diabetic Management?  N/A     Interpreter Needed?: No  Information entered by :: CJohnson, LPN   Activities of Daily Living In your present state of health, do you have any difficulty performing the following activities: 05/20/2020  Hearing? N  Vision? N  Difficulty concentrating or making decisions? N  Walking or climbing stairs? N  Dressing or bathing? N  Doing errands, shopping? N  Preparing Food and eating ? N  Using the Toilet? N  In the past six months, have you accidently leaked urine? N  Do you have problems with loss of bowel control? N  Managing your Medications? N  Managing your Finances? N  Housekeeping or managing your Housekeeping? N  Some recent data might be hidden    Patient Care Team: Tower, Wynelle Fanny, MD as PCP - General Hollice Espy Phylis Bougie, CNM as Referring Physician (Certified Nurse Midwife) Conception Chancy, DDS as Referring Physician (Dentistry)  Indicate any recent Medical Services you may have received from other than [facility] providers in the past year (date may be approximate).     Assessment:   This is a routine wellness examination for Ellyson.  Hearing/Vision screen  Hearing Screening   125Hz  250Hz  500Hz  1000Hz  2000Hz  3000Hz  4000Hz  6000Hz  8000Hz   Right ear:             Left ear:           Vision Screening Comments: Patient gets annual eye exams   Dietary issues and exercise activities discussed: Current Exercise Habits: The patient does not participate in regular exercise at present, Exercise limited by: None identified  Goals    . DIET - INCREASE WATER INTAKE     Starting 05/03/2018, I will continue to drink at least 6-8 glasses of water daily.     . Patient Stated     05/10/2019, no goals    . Patient Stated     05/20/2020, I will continue to walk and swim periodically when I get the urge too.       Depression Screen PHQ 2/9 Scores 05/20/2020 05/10/2019 05/03/2018 05/01/2017 04/29/2016 04/28/2015 11/29/2013  PHQ - 2 Score 1 4 0 0 1 0 0  PHQ- 9 Score 3 7 0 4 - - -    Fall Risk Fall Risk  05/20/2020 05/10/2019 05/03/2018 05/01/2017 04/29/2016  Falls in the past year? 0 0 No No No  Number falls in past yr: 0 - - - -  Injury with Fall? 0 - - - -  Risk for fall due to : No Fall Risks - - - -  Follow up Falls evaluation completed;Falls prevention discussed Falls evaluation completed;Falls prevention discussed - - -    Any stairs in or around the home? Yes  If so, are there any without handrails? No  Home free of loose throw rugs in walkways, pet beds, electrical cords, etc? Yes  Adequate lighting in your home to reduce risk of falls? Yes   ASSISTIVE DEVICES UTILIZED TO PREVENT FALLS:  Life alert? No  Use of a cane, walker or w/c? No  Grab bars in the bathroom? No  Shower chair or bench in shower? No  Elevated toilet seat or a handicapped toilet? No   TIMED UP AND GO:  Was the test performed? N/A, telephonic visit .    Cognitive Function: MMSE - Mini Mental State  Exam 05/20/2020 05/10/2019 05/03/2018 05/01/2017 04/29/2016  Orientation to time 5 4 5 5 5   Orientation to Place 5 5 5 5 5   Registration 3 3 3 3 3   Attention/ Calculation 5 5 0 0 0  Recall 3 3 3 3 3   Language- name 2 objects - 0 0 0 0  Language- repeat 1 1 1 1 1   Language- follow 3 step  command - 0 3 3 3   Language- read & follow direction - 0 0 0 0  Write a sentence - 0 0 0 0  Copy design - 0 0 0 0  Total score - 21 20 20 20   Mini Cog  Mini-Cog screen was completed. Maximum score is 22. A value of 0 denotes this part of the MMSE was not completed or the patient failed this part of the Mini-Cog screening.       Immunizations Immunization History  Administered Date(s) Administered  . Fluad Quad(high Dose 65+) 05/14/2019  . Influenza Split 09/27/2011  . Influenza, High Dose Seasonal PF 07/18/2017  . Influenza,inj,Quad PF,6+ Mos 09/09/2015, 04/29/2016, 05/08/2018  . PFIZER SARS-COV-2 Vaccination 10/10/2019, 10/31/2019  . Pneumococcal Conjugate-13 04/28/2015  . Pneumococcal Polysaccharide-23 11/29/2013  . Td 01/21/2003  . Tdap 11/05/2012    TDAP status: Up to date Flu Vaccine status: due, will discuss when to get this with provider at office visit  Pneumococcal vaccine status: Up to date Covid-19 vaccine status: Completed vaccines  Qualifies for Shingles Vaccine? Yes   Zostavax completed No   Shingrix Completed?: No.    Education has been provided regarding the importance of this vaccine. Patient has been advised to call insurance company to determine out of pocket expense if they have not yet received this vaccine. Advised may also receive vaccine at local pharmacy or Health Dept. Verbalized acceptance and understanding.  Screening Tests Health Maintenance  Topic Date Due  . INFLUENZA VACCINE  04/05/2020  . MAMMOGRAM  04/15/2020  . TETANUS/TDAP  11/06/2022  . COLONOSCOPY  06/27/2026  . DEXA SCAN  Completed  . COVID-19 Vaccine  Completed  . Hepatitis C Screening  Completed  . PNA vac Low Risk Adult  Completed    Health Maintenance  Health Maintenance Due  Topic Date Due  . INFLUENZA VACCINE  04/05/2020  . MAMMOGRAM  04/15/2020    Colorectal cancer screening: Completed 06/28/2019. Repeat every 7 years Mammogram status: due, Patient will call and  schedule appointment soon Bone Density status: due, Patient will calll and schedule appointment soon   Lung Cancer Screening: (Low Dose CT Chest recommended if Age 66-80 years, 30 pack-year currently smoking OR have quit w/in 15 years.) does not qualify.    Additional Screening:  Hepatitis C Screening: does qualify; Completed 04/29/2016  Vision Screening: Recommended annual ophthalmology exams for early detection of glaucoma and other disorders of the eye. Is the patient up to date with their annual eye exam?  Yes  Who is the provider or what is the name of the office in which the patient attends annual eye exams? Dr. Wallace Going  If pt is not established with a provider, would they like to be referred to a provider to establish care? No .   Dental Screening: Recommended annual dental exams for proper oral hygiene  Community Resource Referral / Chronic Care Management: CRR required this visit?  No   CCM required this visit?  No      Plan:     I have personally reviewed and noted the following in  the patient's chart:   . Medical and social history . Use of alcohol, tobacco or illicit drugs  . Current medications and supplements . Functional ability and status . Nutritional status . Physical activity . Advanced directives . List of other physicians . Hospitalizations, surgeries, and ER visits in previous 12 months . Vitals . Screenings to include cognitive, depression, and falls . Referrals and appointments  In addition, I have reviewed and discussed with patient certain preventive protocols, quality metrics, and best practice recommendations. A written personalized care plan for preventive services as well as general preventive health recommendations were provided to patient.   Due to this being a telephonic visit, the after visit summary with patients personalized plan was offered to patient via mail or my-chart.  Patient preferred to pick up at office at next  visit.   Andrez Grime, LPN   [id number]

## 2020-05-20 NOTE — Progress Notes (Signed)
PCP notes:  Health Maintenance: Flu- due Mammogram- due Dexa- due   Abnormal Screenings: none   Patient concerns: Acid reflux onset 6 months ago Difficulty swallowing onset 6 months ago    Nurse concerns: none   Next PCP appt.: 05/29/2020 @ 10:30 am

## 2020-05-24 ENCOUNTER — Telehealth: Payer: Self-pay | Admitting: Family Medicine

## 2020-05-24 DIAGNOSIS — E7800 Pure hypercholesterolemia, unspecified: Secondary | ICD-10-CM

## 2020-05-24 DIAGNOSIS — Z0000 Encounter for general adult medical examination without abnormal findings: Secondary | ICD-10-CM

## 2020-05-24 DIAGNOSIS — E559 Vitamin D deficiency, unspecified: Secondary | ICD-10-CM

## 2020-05-24 DIAGNOSIS — E049 Nontoxic goiter, unspecified: Secondary | ICD-10-CM

## 2020-05-24 DIAGNOSIS — R7303 Prediabetes: Secondary | ICD-10-CM

## 2020-05-24 NOTE — Telephone Encounter (Signed)
-----   Message from Ellamae Sia sent at 05/13/2020  2:43 PM EDT ----- Regarding: Lab orders for Monday, 9.20.21 Patient is scheduled for CPX labs, please order future labs, Thanks , Karna Christmas

## 2020-05-25 ENCOUNTER — Other Ambulatory Visit (INDEPENDENT_AMBULATORY_CARE_PROVIDER_SITE_OTHER): Payer: Medicare PPO

## 2020-05-25 ENCOUNTER — Other Ambulatory Visit: Payer: Self-pay

## 2020-05-25 ENCOUNTER — Ambulatory Visit: Payer: Medicare Other

## 2020-05-25 DIAGNOSIS — Z0000 Encounter for general adult medical examination without abnormal findings: Secondary | ICD-10-CM

## 2020-05-25 DIAGNOSIS — E7800 Pure hypercholesterolemia, unspecified: Secondary | ICD-10-CM

## 2020-05-25 DIAGNOSIS — R7303 Prediabetes: Secondary | ICD-10-CM | POA: Diagnosis not present

## 2020-05-25 DIAGNOSIS — E559 Vitamin D deficiency, unspecified: Secondary | ICD-10-CM | POA: Diagnosis not present

## 2020-05-25 DIAGNOSIS — E049 Nontoxic goiter, unspecified: Secondary | ICD-10-CM | POA: Diagnosis not present

## 2020-05-25 LAB — CBC WITH DIFFERENTIAL/PLATELET
Basophils Absolute: 0.1 10*3/uL (ref 0.0–0.1)
Basophils Relative: 0.6 % (ref 0.0–3.0)
Eosinophils Absolute: 0.2 10*3/uL (ref 0.0–0.7)
Eosinophils Relative: 2.3 % (ref 0.0–5.0)
HCT: 41.9 % (ref 36.0–46.0)
Hemoglobin: 14.4 g/dL (ref 12.0–15.0)
Lymphocytes Relative: 25.3 % (ref 12.0–46.0)
Lymphs Abs: 2.1 10*3/uL (ref 0.7–4.0)
MCHC: 34.3 g/dL (ref 30.0–36.0)
MCV: 93.9 fl (ref 78.0–100.0)
Monocytes Absolute: 0.7 10*3/uL (ref 0.1–1.0)
Monocytes Relative: 8.3 % (ref 3.0–12.0)
Neutro Abs: 5.2 10*3/uL (ref 1.4–7.7)
Neutrophils Relative %: 63.5 % (ref 43.0–77.0)
Platelets: 217 10*3/uL (ref 150.0–400.0)
RBC: 4.46 Mil/uL (ref 3.87–5.11)
RDW: 12.7 % (ref 11.5–15.5)
WBC: 8.2 10*3/uL (ref 4.0–10.5)

## 2020-05-25 LAB — COMPREHENSIVE METABOLIC PANEL
ALT: 13 U/L (ref 0–35)
AST: 14 U/L (ref 0–37)
Albumin: 4.4 g/dL (ref 3.5–5.2)
Alkaline Phosphatase: 78 U/L (ref 39–117)
BUN: 21 mg/dL (ref 6–23)
CO2: 29 mEq/L (ref 19–32)
Calcium: 9.7 mg/dL (ref 8.4–10.5)
Chloride: 103 mEq/L (ref 96–112)
Creatinine, Ser: 0.71 mg/dL (ref 0.40–1.20)
GFR: 80.99 mL/min (ref >60.00)
Glucose, Bld: 122 mg/dL — ABNORMAL HIGH (ref 70–99)
Potassium: 4.6 mEq/L (ref 3.5–5.1)
Sodium: 140 mEq/L (ref 135–145)
Total Bilirubin: 0.6 mg/dL (ref 0.2–1.2)
Total Protein: 6.8 g/dL (ref 6.0–8.3)

## 2020-05-25 LAB — LIPID PANEL
Cholesterol: 182 mg/dL (ref 0–200)
HDL: 48.6 mg/dL (ref >39.00)
LDL Cholesterol: 116 mg/dL — ABNORMAL HIGH (ref 0–99)
NonHDL: 133.63
Total CHOL/HDL Ratio: 4
Triglycerides: 90 mg/dL (ref 0.0–149.0)
VLDL: 18 mg/dL (ref 0.0–40.0)

## 2020-05-25 LAB — HEMOGLOBIN A1C: Hgb A1c MFr Bld: 6.4 % (ref 4.6–6.5)

## 2020-05-25 LAB — TSH: TSH: 1.7 u[IU]/mL (ref 0.35–4.50)

## 2020-05-25 LAB — VITAMIN D 25 HYDROXY (VIT D DEFICIENCY, FRACTURES): VITD: 25.85 ng/mL — ABNORMAL LOW (ref 30.00–100.00)

## 2020-05-29 ENCOUNTER — Ambulatory Visit (INDEPENDENT_AMBULATORY_CARE_PROVIDER_SITE_OTHER): Payer: Medicare PPO | Admitting: Family Medicine

## 2020-05-29 ENCOUNTER — Other Ambulatory Visit: Payer: Self-pay

## 2020-05-29 ENCOUNTER — Encounter: Payer: Self-pay | Admitting: Family Medicine

## 2020-05-29 VITALS — BP 130/80 | HR 82 | Temp 97.2°F | Ht 64.0 in | Wt 214.4 lb

## 2020-05-29 DIAGNOSIS — E559 Vitamin D deficiency, unspecified: Secondary | ICD-10-CM

## 2020-05-29 DIAGNOSIS — Z0000 Encounter for general adult medical examination without abnormal findings: Secondary | ICD-10-CM | POA: Diagnosis not present

## 2020-05-29 DIAGNOSIS — Z23 Encounter for immunization: Secondary | ICD-10-CM | POA: Diagnosis not present

## 2020-05-29 DIAGNOSIS — E2839 Other primary ovarian failure: Secondary | ICD-10-CM | POA: Diagnosis not present

## 2020-05-29 DIAGNOSIS — E669 Obesity, unspecified: Secondary | ICD-10-CM

## 2020-05-29 DIAGNOSIS — E7800 Pure hypercholesterolemia, unspecified: Secondary | ICD-10-CM | POA: Diagnosis not present

## 2020-05-29 DIAGNOSIS — R7303 Prediabetes: Secondary | ICD-10-CM

## 2020-05-29 DIAGNOSIS — M8589 Other specified disorders of bone density and structure, multiple sites: Secondary | ICD-10-CM

## 2020-05-29 DIAGNOSIS — E041 Nontoxic single thyroid nodule: Secondary | ICD-10-CM

## 2020-05-29 DIAGNOSIS — F411 Generalized anxiety disorder: Secondary | ICD-10-CM

## 2020-05-29 NOTE — Patient Instructions (Addendum)
Don't forget to schedule your mammogram and dexa (bone density test)   Vitamin D is most important for bone health  Take it consistently  Your level is low -this can also make you feel bad   Avoid red meat/ fried foods/ egg yolks/ fatty breakfast meats/ butter, cheese and high fat dairy/ and shellfish   You are prediabetic  Try to get most of your carbohydrates from produce (with the exception of white potatoes)  Eat less bread/pasta/rice/snack foods/cereals/sweets and other items from the middle of the grocery store (processed carbs)     COVID-19 Vaccine Information can be found at: ShippingScam.co.uk For questions related to vaccine distribution or appointments, please email [email or web address] .[email or web address] or call [phone].

## 2020-05-29 NOTE — Progress Notes (Signed)
Subjective:    Patient ID: Deborah Carey, female    DOB: 08/07/1951, 69 y.o.   MRN: [id number]  This visit occurred during the SARS-CoV-2 public health emergency.  Safety protocols were in place, including screening questions prior to the visit, additional usage of staff PPE, and extensive cleaning of exam room while observing appropriate contact time as indicated for disinfecting solutions.    HPI Here for health maintenance exam and to review chronic medical problems  Wt Readings from Last 3 Encounters:  05/29/20 214 lb 6 oz (97.2 kg)  06/28/19 222 lb (100.7 kg)  06/13/19 222 lb (100.7 kg)  down 8 lb  Has been watching diet and loosing wt slowly  36.80 kg/m   Does water exercise - when access to water  Walks some - trouble with leg   Needs to get motivated    Had amw on 9/15 Disc care gaps  Flu shot-given today  covid status -immunized pfizer  Mammogram 8/20-has reminder letter to set up  Self breast exam -no lumps   Colonoscopy 10/20 with 7 y recall   dexa 10/18 -osteopenia  fam hx of OP Falls- none fx-none Supplements- vit D-not consistent  D level is 25.8 Exercise -not a lot   BP Readings from Last 3 Encounters:  05/29/20 (!) 142/76  06/28/19 127/66  05/14/19 135/82   re check  BP: 130/80   Pulse Readings from Last 3 Encounters:  05/29/20 82  06/28/19 67  05/14/19 98   Wants to discuss adv directive-has materials   Thyroid-h/o nodule  [place] in 2019 -5 year stability   Lab Results  Component Value Date   TSH 1.70 05/25/2020   Hyperlipidemia Lab Results  Component Value Date   CHOL 182 05/25/2020   CHOL 179 05/09/2019   CHOL 182 05/03/2018   Lab Results  Component Value Date   HDL 48.60 05/25/2020   HDL 48.80 05/09/2019   HDL 49.20 05/03/2018   Lab Results  Component Value Date   LDLCALC 116 (H) 05/25/2020   LDLCALC 105 (H) 05/09/2019   LDLCALC 113 (H) 05/03/2018   Lab Results  Component Value Date   TRIG 90.0 05/25/2020   TRIG  129.0 05/09/2019   TRIG 102.0 05/03/2018   Lab Results  Component Value Date   CHOLHDL 4 05/25/2020   CHOLHDL 4 05/09/2019   CHOLHDL 4 05/03/2018   Lab Results  Component Value Date   LDLDIRECT 141.8 10/30/2013   LDL is up slightly  Eating more beef    Prediabetes Lab Results  Component Value Date   HGBA1C 6.4 05/25/2020    Lab Results  Component Value Date   WBC 8.2 05/25/2020   HGB 14.4 05/25/2020   HCT 41.9 05/25/2020   MCV 93.9 05/25/2020   PLT 217.0 05/25/2020   Lab Results  Component Value Date   CREATININE 0.71 05/25/2020   BUN 21 05/25/2020   NA 140 05/25/2020   K 4.6 05/25/2020   CL 103 05/25/2020   CO2 29 05/25/2020   Lab Results  Component Value Date   ALT 13 05/25/2020   AST 14 05/25/2020   ALKPHOS 78 05/25/2020   BILITOT 0.6 05/25/2020     Stress level is very high  The world events especially  Has to avoid the news  A lot of loss - lost 3 family members in 4 months  Tearful/emotional /grumpy   No caffeine    Patient Active Problem List   Diagnosis Date Noted  Colon cancer screening 05/14/2019   Vitamin D deficiency 05/08/2018   Flushing 06/20/2017   H/O fracture of fibula 01/21/2014   Osteopenia 01/07/2014   Family history of osteoporosis 11/29/2013   Estrogen deficiency 11/29/2013   Hyperlipidemia 11/29/2013   Encounter for Medicare annual wellness exam 10/29/2013   Irregular heartbeat 10/07/2013   Left thyroid nodule 09/26/2013   Enlarged thyroid 09/16/2013   Primary osteoarthritis of both knees 11/05/2012   Other screening mammogram 09/27/2011   Routine general medical examination at a health care facility 09/19/2011   Obesity (BMI 35.0-39.9 without comorbidity) 06/06/2008   Prediabetes 06/06/2008   Anxiety state 04/17/2007   ALLERGIC RHINITIS 04/17/2007   Past Medical History:  Diagnosis Date   Allergy    allergic rhinitis   Anxiety    Cataract    removed both eyes    Infertility, female      Osteoarthritis of both knees    OA    PONV (postoperative nausea and vomiting)    Post-menopausal bleeding    neg endo bx.   Past Surgical History:  Procedure Laterality Date   cataract surgery  2011   CESAREAN SECTION     times 2   COLONOSCOPY  2010   hems    ENDOMETRIAL BIOPSY     neg for CA cells   FACIAL COSMETIC SURGERY  2006   face lift   FOOT SURGERY  1998   rt heel spur removal   REFRACTIVE SURGERY  08/1999   Social History   Tobacco Use   Smoking status: Never Smoker   Smokeless tobacco: Never Used  Vaping Use   Vaping Use: Never used  Substance Use Topics   Alcohol use: No    Alcohol/week: 0.0 standard drinks   Drug use: No   Family History  Problem Relation Age of Onset   COPD Brother    Stroke Mother    Hypertension Mother    Osteoporosis Mother    Alzheimer's disease Father    Hypertension Father    Osteoporosis Father    Depression Sister    Hypertension Sister    Osteoporosis Sister    Thyroid disease Sister    Hypothyroidism Sister    Parkinson's disease Sister    Osteoporosis Sister    Hypothyroidism Sister    Breast cancer Neg Hx    Colon polyps Neg Hx    Colon cancer Neg Hx    Esophageal cancer Neg Hx    Rectal cancer Neg Hx    Stomach cancer Neg Hx    No Known Allergies Current Outpatient Medications on File Prior to Visit  Medication Sig Dispense Refill   albuterol (PROVENTIL HFA;VENTOLIN HFA) 108 (90 Base) MCG/ACT inhaler Inhale 1-2 puffs into the lungs every 6 (six) hours as needed for wheezing (or for cough). 1 Inhaler 1   CALCIUM PO Take by mouth as needed.      cetirizine (ZYRTEC) 10 MG tablet Take 10 mg by mouth daily. Taking as needed     Cholecalciferol (VITAMIN D3) 125 MCG (5000 UT) CAPS Take by mouth.     fluticasone (FLONASE) 50 MCG/ACT nasal spray Place 2 sprays into both nostrils as needed. 16 g 1   Multiple Vitamins-Minerals (MULTIVITAMIN PO) Take by mouth as needed.       naproxen sodium (ANAPROX) 220 MG tablet Take 220 mg by mouth as needed.     Omega-3 Fatty Acids (FISH OIL PO) Take by mouth as needed.      No current  facility-administered medications on file prior to visit.    Review of Systems  Constitutional: Positive for fatigue. Negative for activity change, appetite change, fever and unexpected weight change.  HENT: Negative for congestion, ear pain, rhinorrhea, sinus pressure and sore throat.   Eyes: Negative for pain, redness and visual disturbance.  Respiratory: Negative for cough, shortness of breath and wheezing.   Cardiovascular: Negative for chest pain and palpitations.  Gastrointestinal: Negative for abdominal pain, blood in stool, constipation and diarrhea.  Endocrine: Negative for polydipsia and polyuria.  Genitourinary: Negative for dysuria, frequency and urgency.  Musculoskeletal: Positive for arthralgias. Negative for back pain and myalgias.  Skin: Negative for pallor and rash.  Allergic/Immunologic: Negative for environmental allergies.  Neurological: Negative for dizziness, syncope and headaches.  Hematological: Negative for adenopathy. Does not bruise/bleed easily.  Psychiatric/Behavioral: Positive for sleep disturbance. Negative for decreased concentration and dysphoric mood. The patient is nervous/anxious.        Objective:   Physical Exam Constitutional:      General: She is not in acute distress.    Appearance: Normal appearance. She is well-developed. She is obese. She is not ill-appearing or diaphoretic.  HENT:     Head: Normocephalic and atraumatic.     Right Ear: Tympanic membrane, ear canal and external ear normal.     Left Ear: Tympanic membrane, ear canal and external ear normal.     Nose: Nose normal. No congestion.     Mouth/Throat:     Mouth: Mucous membranes are moist.     Pharynx: Oropharynx is clear. No posterior oropharyngeal erythema.  Eyes:     General: No scleral icterus.    Extraocular Movements:  Extraocular movements intact.     Conjunctiva/sclera: Conjunctivae normal.     Pupils: Pupils are equal, round, and reactive to light.  Neck:     Thyroid: No thyromegaly.     Vascular: No carotid bruit or JVD.  Cardiovascular:     Rate and Rhythm: Normal rate and regular rhythm.     Pulses: Normal pulses.     Heart sounds: Normal heart sounds. No gallop.   Pulmonary:     Effort: Pulmonary effort is normal. No respiratory distress.     Breath sounds: Normal breath sounds. No wheezing.     Comments: Good air exch Chest:     Chest wall: No tenderness.  Abdominal:     General: Bowel sounds are normal. There is no distension or abdominal bruit.     Palpations: Abdomen is soft. There is no mass.     Tenderness: There is no abdominal tenderness.     Hernia: No hernia is present.  Genitourinary:    Comments: Breast exam: No mass, nodules, thickening, tenderness, bulging, retraction, inflamation, nipple discharge or skin changes noted.  No axillary or clavicular LA.     Musculoskeletal:        General: No tenderness. Normal range of motion.     Cervical back: Normal range of motion and neck supple. No rigidity. No muscular tenderness.     Right lower leg: No edema.     Left lower leg: No edema.     Comments: No kyphosis   Lymphadenopathy:     Cervical: No cervical adenopathy.  Skin:    General: Skin is warm and dry.     Coloration: Skin is not pale.     Findings: No erythema or rash.     Comments: Fair complexion   Neurological:     Mental Status:  She is alert. Mental status is at baseline.     Cranial Nerves: No cranial nerve deficit.     Motor: No abnormal muscle tone.     Coordination: Coordination normal.     Gait: Gait normal.     Deep Tendon Reflexes: Reflexes are normal and symmetric. Reflexes normal.  Psychiatric:        Mood and Affect: Mood is anxious.        Cognition and Memory: Cognition and memory normal.     Comments: Pt talks candidly about stressors and symptoms             Assessment & Plan:   Problem List Items Addressed This Visit      Endocrine   Left thyroid nodule    [place] 2019 showed stability and recommended 5 y re check Lab Results  Component Value Date   TSH 1.70 05/25/2020           Musculoskeletal and Integument   Osteopenia    dexa rev from 10/18 and order made for the next one  fam hx of OP No falls or fx Enc to take her vit D more consistently (level is low) Not a lot of exercise          Other   Obesity (BMI 35.0-39.9 without comorbidity)    Discussed how this problem influences overall health and the risks it imposes  Reviewed plan for weight loss with lower calorie diet (via better food choices and also portion control or program like weight watchers) and exercise building up to or more than 30 minutes 5 days per week including some aerobic activity         Anxiety state    Pt has noted inc in anx symptoms with stressors/pandemic Nadyne Coombes Offered counseling ref  Not enough time to address fully today-may benefit from medication  Enc f/u to address further       Prediabetes    Lab Results  Component Value Date   HGBA1C 6.4 05/25/2020   disc imp of low glycemic diet and wt loss to prevent DM2  Pt would like to work on wt loss       Routine general medical examination at a health care facility - Primary    Reviewed health habits including diet and exercise and skin cancer prevention Reviewed appropriate screening tests for age  Also reviewed health mt list, fam hx and immunization status , as well as social and family history   See HPI Labs reviewed  amw reviewed  Flu shot given covid immunized  Plans to schedule mammogram and dexa for next month (ref done for dexa) Disc imp of vit D and enc better supplementation  Offered counseling for stress/anxiety  Disc imp of self care      Estrogen deficiency   Relevant Orders   DG Bone Density   Hyperlipidemia    Disc goals for lipids and reasons  to control them Rev last labs with pt Rev low sat fat diet in detail LDL is up -eating more beef        Vitamin D deficiency    Not taking her D  Level 25.8 Disc imp to bone and overall health  Enc strongly to get back on at least 2000 iu D3 daily        Other Visit Diagnoses    Need for influenza vaccination       Relevant Orders   Flu Vaccine QUAD High Dose(Fluad) (Completed)

## 2020-05-31 NOTE — Assessment & Plan Note (Signed)
[place] 2019 showed stability and recommended 5 y re check Lab Results  Component Value Date   TSH 1.70 05/25/2020

## 2020-05-31 NOTE — Assessment & Plan Note (Signed)
Disc goals for lipids and reasons to control them Rev last labs with pt Rev low sat fat diet in detail LDL is up -eating more beef

## 2020-05-31 NOTE — Assessment & Plan Note (Signed)
Lab Results  Component Value Date   HGBA1C 6.4 05/25/2020   disc imp of low glycemic diet and wt loss to prevent DM2  Pt would like to work on wt loss

## 2020-05-31 NOTE — Assessment & Plan Note (Addendum)
dexa rev from 10/18 and order made for the next one  fam hx of OP No falls or fx Enc to take her vit D more consistently (level is low) Not a lot of exercise

## 2020-05-31 NOTE — Assessment & Plan Note (Signed)
Reviewed health habits including diet and exercise and skin cancer prevention Reviewed appropriate screening tests for age  Also reviewed health mt list, fam hx and immunization status , as well as social and family history   See HPI Labs reviewed  amw reviewed  Flu shot given covid immunized  Plans to schedule mammogram and dexa for next month (ref done for dexa) Disc imp of vit D and enc better supplementation  Offered counseling for stress/anxiety  Disc imp of self care

## 2020-05-31 NOTE — Assessment & Plan Note (Signed)
Pt has noted inc in anx symptoms with stressors/pandemic Nadyne Coombes Offered counseling ref  Not enough time to address fully today-may benefit from medication  Enc f/u to address further

## 2020-05-31 NOTE — Assessment & Plan Note (Signed)
Discussed how this problem influences overall health and the risks it imposes  Reviewed plan for weight loss with lower calorie diet (via better food choices and also portion control or program like weight watchers) and exercise building up to or more than 30 minutes 5 days per week including some aerobic activity    

## 2020-05-31 NOTE — Assessment & Plan Note (Signed)
Not taking her D  Level 25.8 Disc imp to bone and overall health  Enc strongly to get back on at least 2000 iu D3 daily

## 2020-06-18 ENCOUNTER — Other Ambulatory Visit: Payer: Self-pay | Admitting: Family Medicine

## 2020-06-18 DIAGNOSIS — Z1231 Encounter for screening mammogram for malignant neoplasm of breast: Secondary | ICD-10-CM

## 2020-07-27 ENCOUNTER — Ambulatory Visit: Payer: Medicare PPO | Attending: Internal Medicine

## 2020-07-27 DIAGNOSIS — Z23 Encounter for immunization: Secondary | ICD-10-CM

## 2020-07-27 NOTE — Progress Notes (Signed)
   Covid-19 Vaccination Clinic  Name:  Deborah Carey    MRN: [id number] DOB: [birth year]  07/27/2020  Deborah Carey was observed post Covid-19 immunization for 15 minutes without incident. She was provided with Vaccine Information Sheet and instruction to access the V-Safe system.   Deborah Carey was instructed to call 911 with any severe reactions post vaccine: Marland Kitchen Difficulty breathing  . Swelling of face and throat  . A fast heartbeat  . A bad rash all over body  . Dizziness and weakness   Immunizations Administered    Name Date Dose VIS Date Route   Pfizer COVID-19 Vaccine 07/27/2020  3:48 PM 0.3 mL 06/24/2020 Intramuscular   Manufacturer: [employer]   Lot: [id number]   [place]: [id number]

## 2020-07-28 ENCOUNTER — Other Ambulatory Visit: Payer: Medicare PPO

## 2020-09-17 ENCOUNTER — Other Ambulatory Visit: Payer: Self-pay

## 2020-09-17 ENCOUNTER — Ambulatory Visit
Admission: RE | Admit: 2020-09-17 | Discharge: 2020-09-17 | Disposition: A | Discharge status: Home / Self Care | Payer: Medicare PPO | Source: Ambulatory Visit | Attending: Family Medicine | Admitting: Family Medicine

## 2020-09-17 DIAGNOSIS — Z1231 Encounter for screening mammogram for malignant neoplasm of breast: Secondary | ICD-10-CM | POA: Diagnosis not present

## 2020-09-17 DIAGNOSIS — E2839 Other primary ovarian failure: Secondary | ICD-10-CM | POA: Insufficient documentation

## 2020-09-17 DIAGNOSIS — M85851 Other specified disorders of bone density and structure, right thigh: Secondary | ICD-10-CM | POA: Diagnosis not present

## 2020-09-17 DIAGNOSIS — Z87828 Personal history of other (healed) physical injury and trauma: Secondary | ICD-10-CM | POA: Diagnosis not present

## 2020-09-17 DIAGNOSIS — Z780 Asymptomatic menopausal state: Secondary | ICD-10-CM | POA: Diagnosis not present

## 2020-09-17 IMAGING — MG DIGITAL SCREENING BILAT W/ TOMO W/ CAD
8 series · 8 of 24 positions shown · non-contrast
Comparison: Previous exam(s).

CLINICAL DATA: Screening.

EXAM:
DIGITAL SCREENING BILATERAL MAMMOGRAM WITH TOMO AND CAD

[R MLO synth-2D]
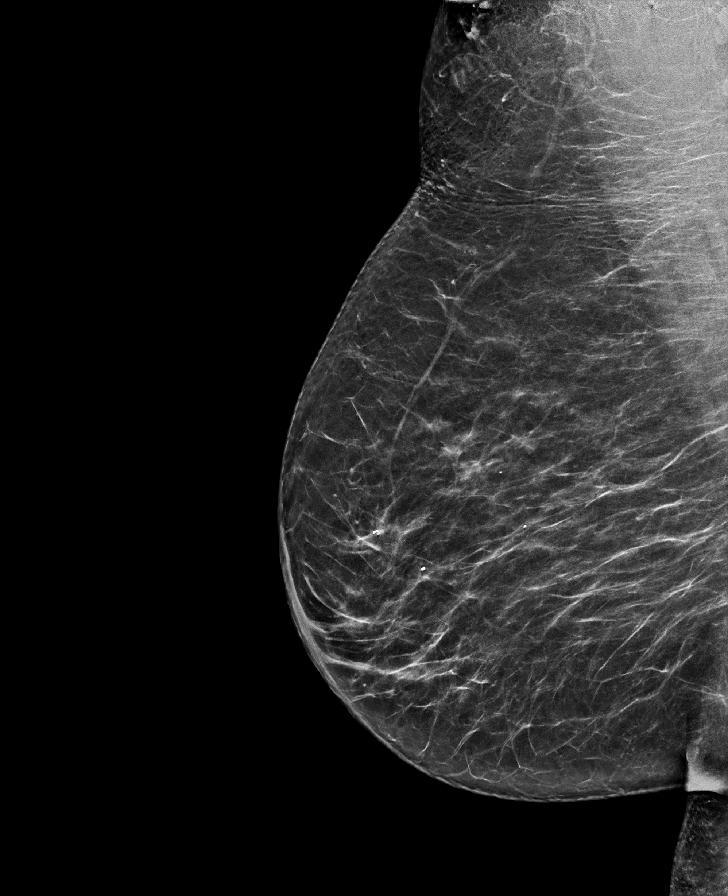

[L MLO synth-2D]
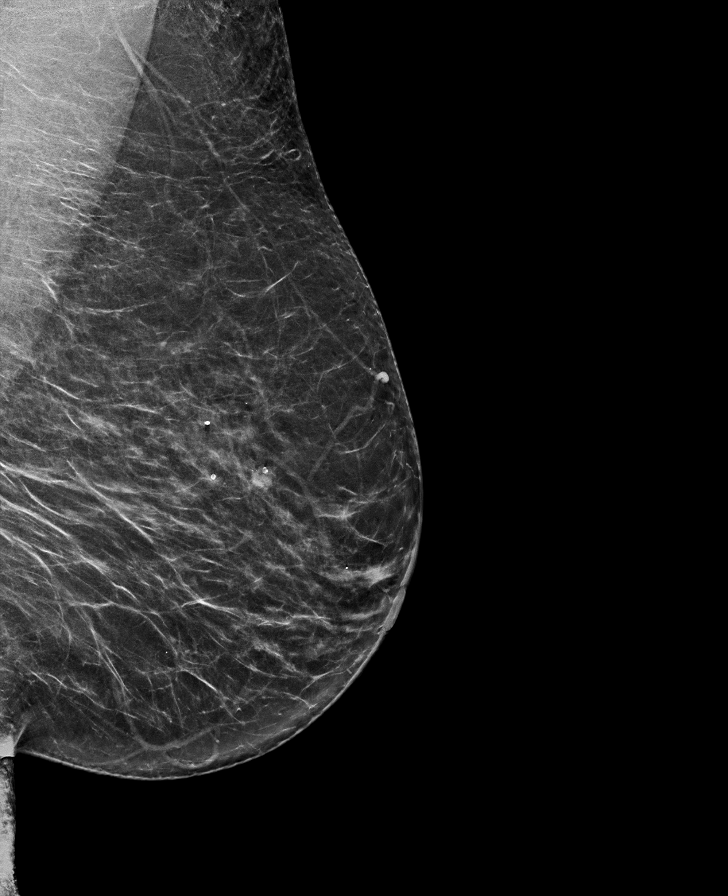

[R CC synth-2D]
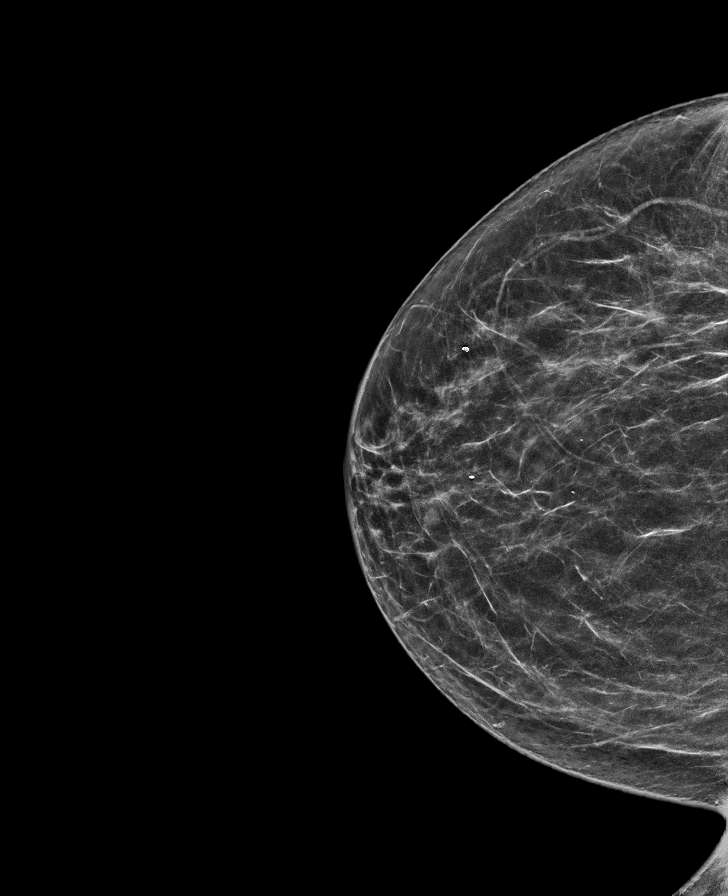

[L CC synth-2D]
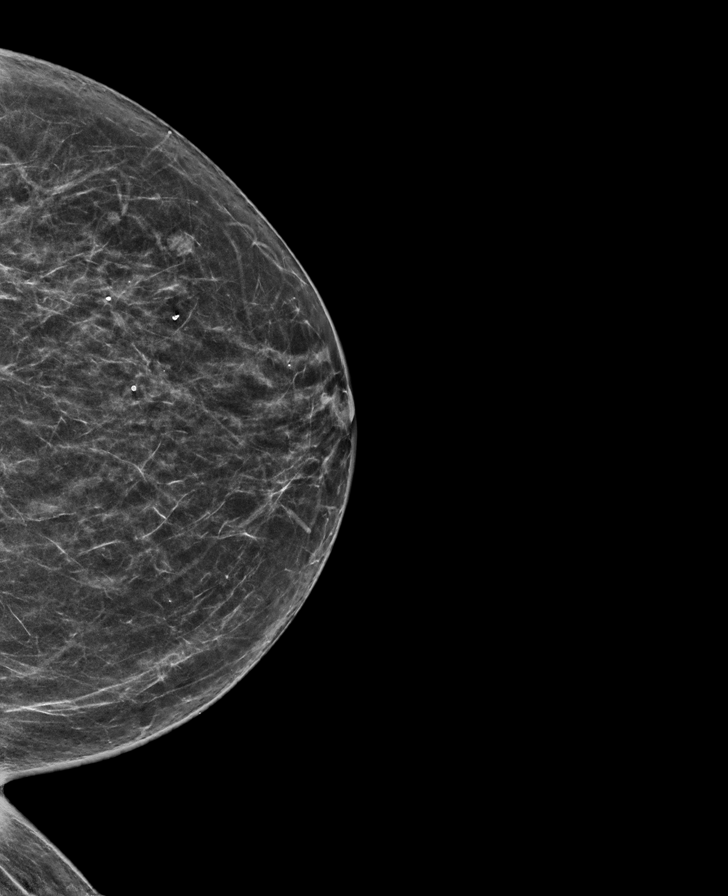

[R MLO tomo · tomo slice 37/72.0]
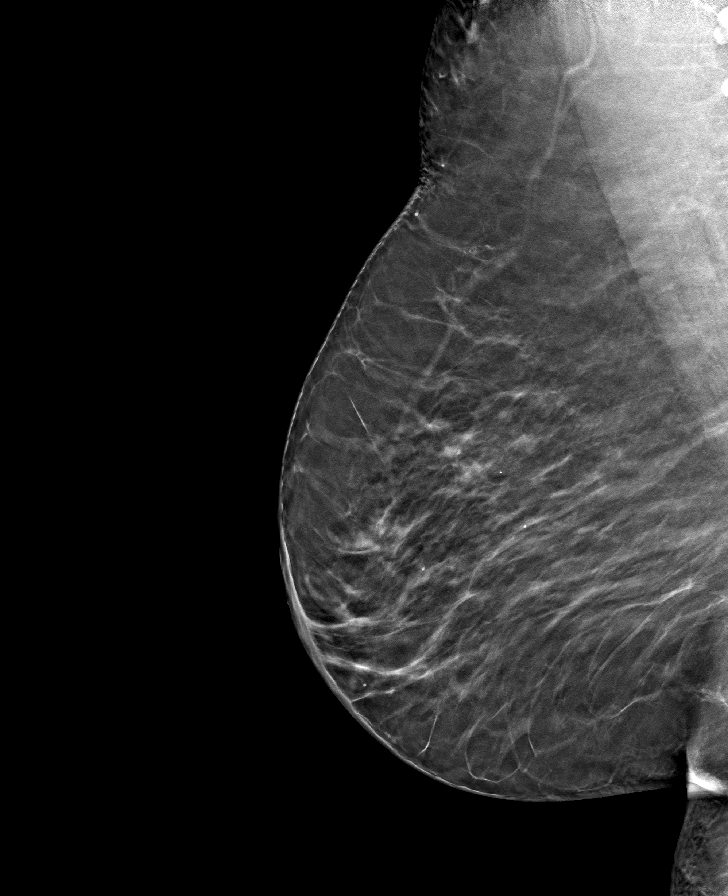

[L CC tomo · tomo slice 31/62.0]
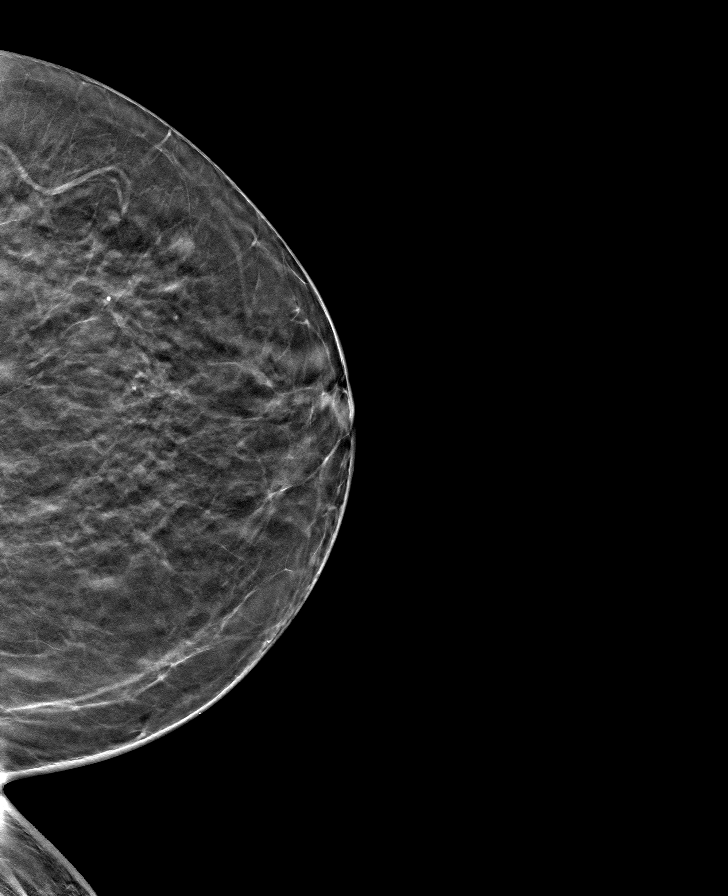

[R CC tomo · tomo slice 33/65.0]
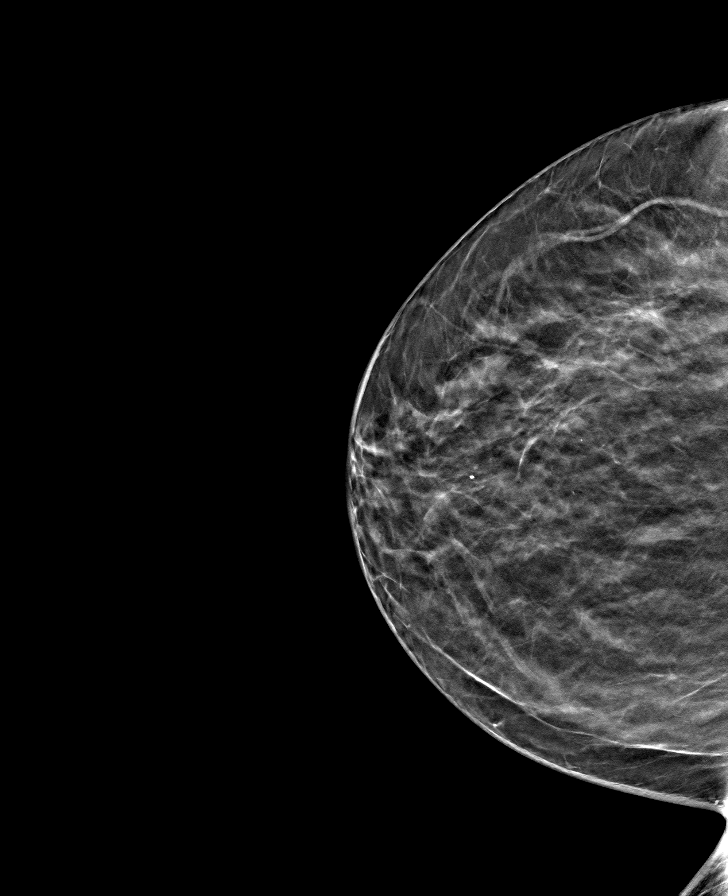

[L MLO tomo · tomo slice 35/70.0]
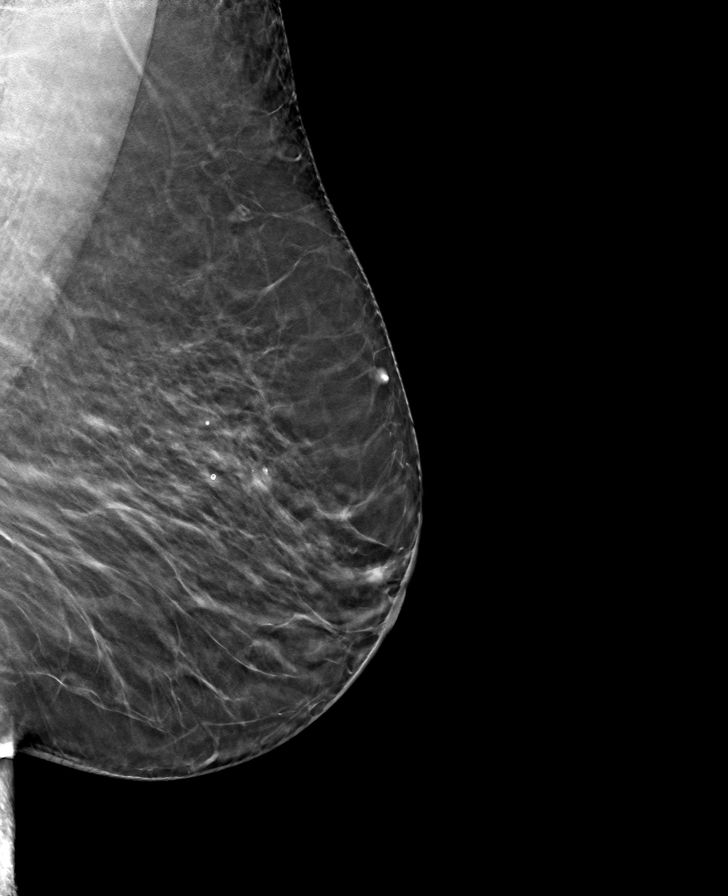

[8 of 24 positions shown; findings below may reference images not displayed]

ACR Breast Density Category b: There are scattered areas of
fibroglandular density.
FINDINGS: In the left breast, 2 possible masses warrant further evaluation. In
the right breast, no findings suspicious for malignancy.

Images were processed with CAD.
IMPRESSION: Further evaluation is suggested for 2 possible masses in the left
breast.

RECOMMENDATION:
Diagnostic mammogram and possibly ultrasound of the left breast.
(Code:[KG])

The patient will be contacted regarding the findings, and additional
imaging will be scheduled.

BI-RADS CATEGORY  0: Incomplete. Need additional imaging evaluation
and/or prior mammograms for comparison.

## 2020-09-22 ENCOUNTER — Other Ambulatory Visit: Payer: Self-pay | Admitting: Family Medicine

## 2020-09-22 DIAGNOSIS — N6320 Unspecified lump in the left breast, unspecified quadrant: Secondary | ICD-10-CM

## 2020-09-22 DIAGNOSIS — R928 Other abnormal and inconclusive findings on diagnostic imaging of breast: Secondary | ICD-10-CM

## 2020-09-25 ENCOUNTER — Ambulatory Visit
Admission: RE | Admit: 2020-09-25 | Discharge: 2020-09-25 | Disposition: A | Discharge status: Home / Self Care | Payer: Medicare PPO | Source: Ambulatory Visit | Attending: Family Medicine | Admitting: Family Medicine

## 2020-09-25 ENCOUNTER — Other Ambulatory Visit: Payer: Self-pay

## 2020-09-25 DIAGNOSIS — N6320 Unspecified lump in the left breast, unspecified quadrant: Secondary | ICD-10-CM | POA: Diagnosis not present

## 2020-09-25 DIAGNOSIS — N6325 Unspecified lump in the left breast, overlapping quadrants: Secondary | ICD-10-CM | POA: Diagnosis not present

## 2020-09-25 DIAGNOSIS — R928 Other abnormal and inconclusive findings on diagnostic imaging of breast: Secondary | ICD-10-CM | POA: Diagnosis not present

## 2020-09-25 IMAGING — MG MM DIGITAL DIAGNOSTIC UNILAT*L* W/ TOMO W/ CAD
6 series · 6 of 18 positions shown · non-contrast
Comparison: Previous exam(s).

CLINICAL DATA: Screening recall for possible left breast masses.
The patient has had her COVID booster sometime in early to mid
[place]. No inflammation or infection of the left breast her upper
extremity.

EXAM:
DIGITAL DIAGNOSTIC UNILATERAL LEFT MAMMOGRAM WITH TOMO AND CAD;
ULTRASOUND LEFT BREAST LIMITED
TECHNIQUE: Left digital diagnostic mammography and breast tomosynthesis was
performed. Digital images of the breasts were evaluated with
computer-aided detection.

[L MLO synth-2D]
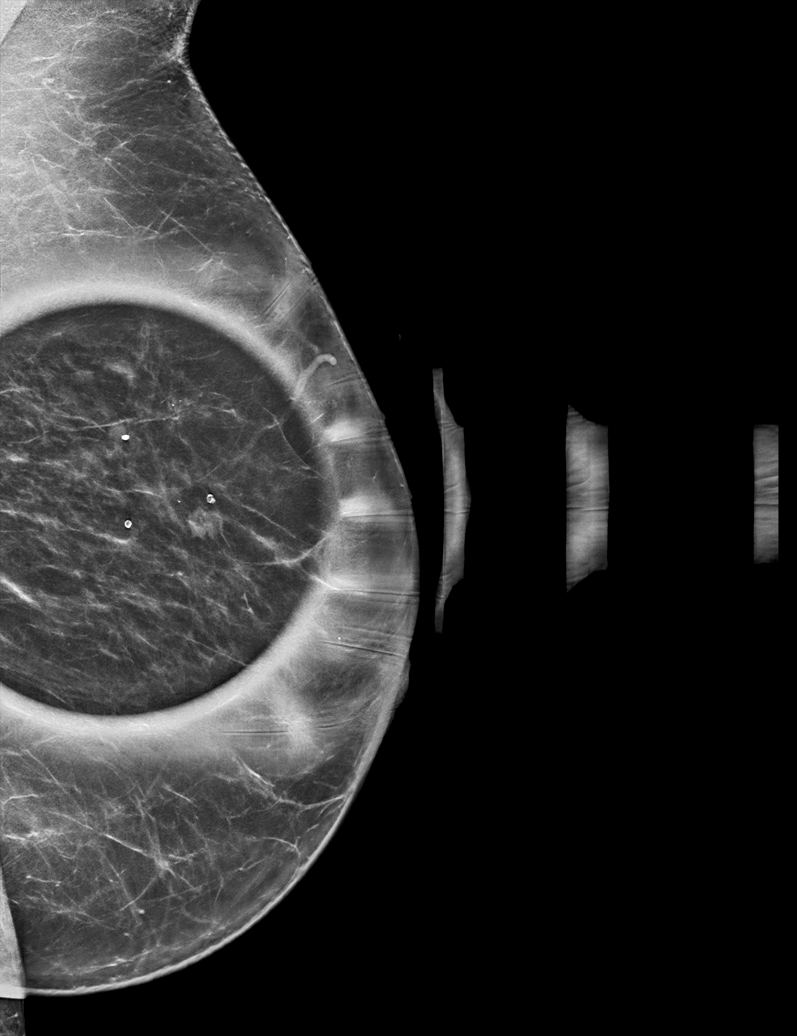

[L ML synth-2D]
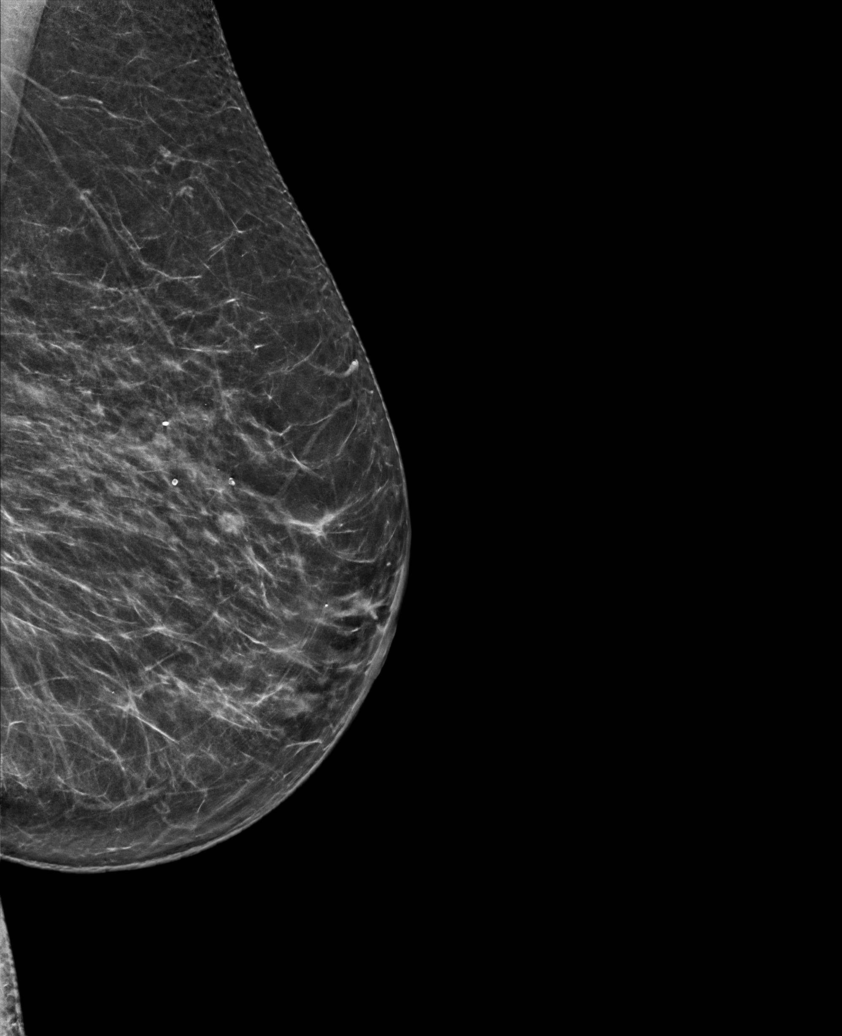

[L CC synth-2D]
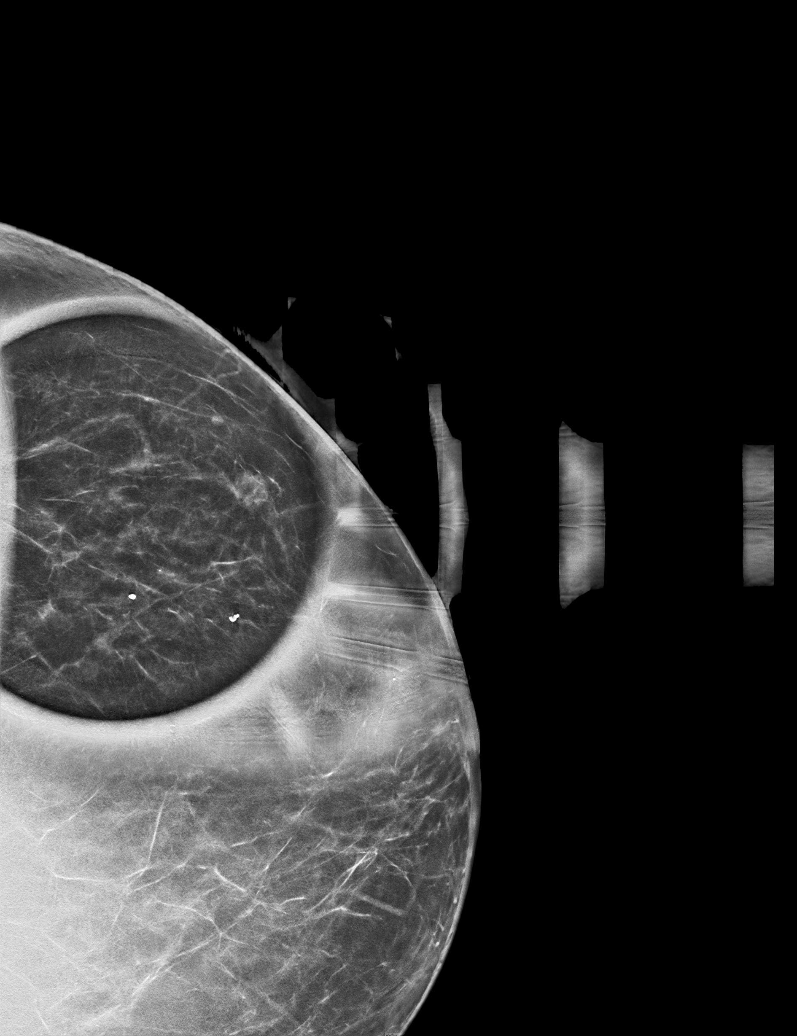

[L MLO tomo · tomo slice 33/66.0]
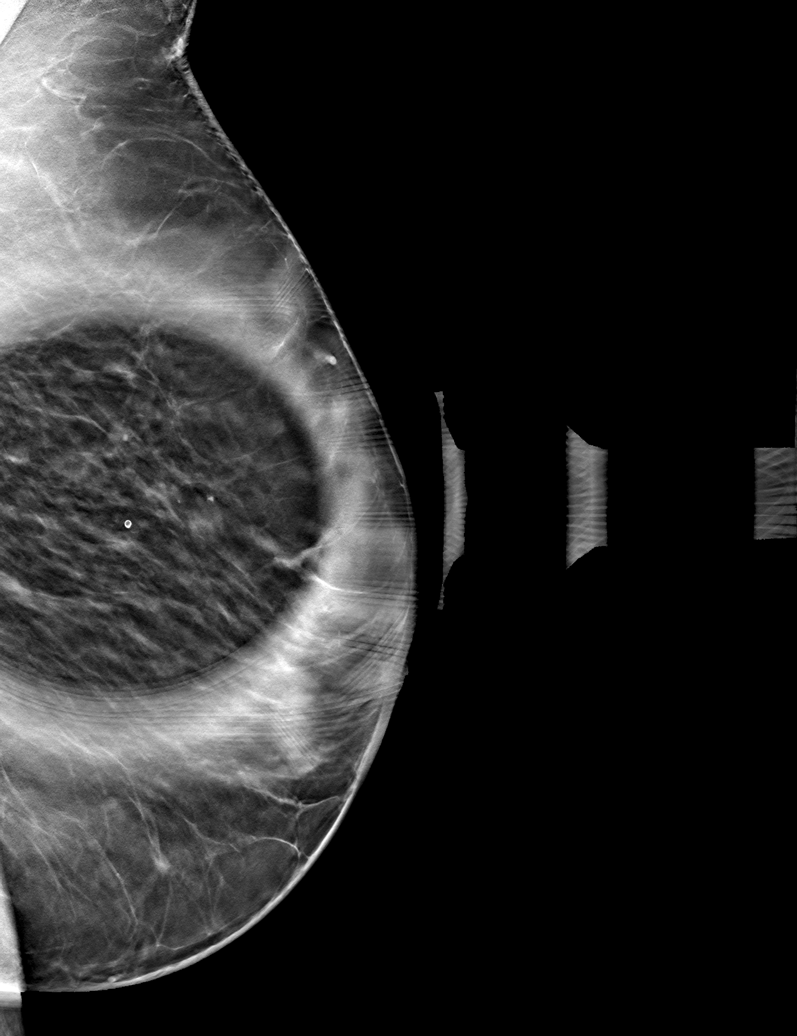

[L CC tomo · tomo slice 30/59.0]
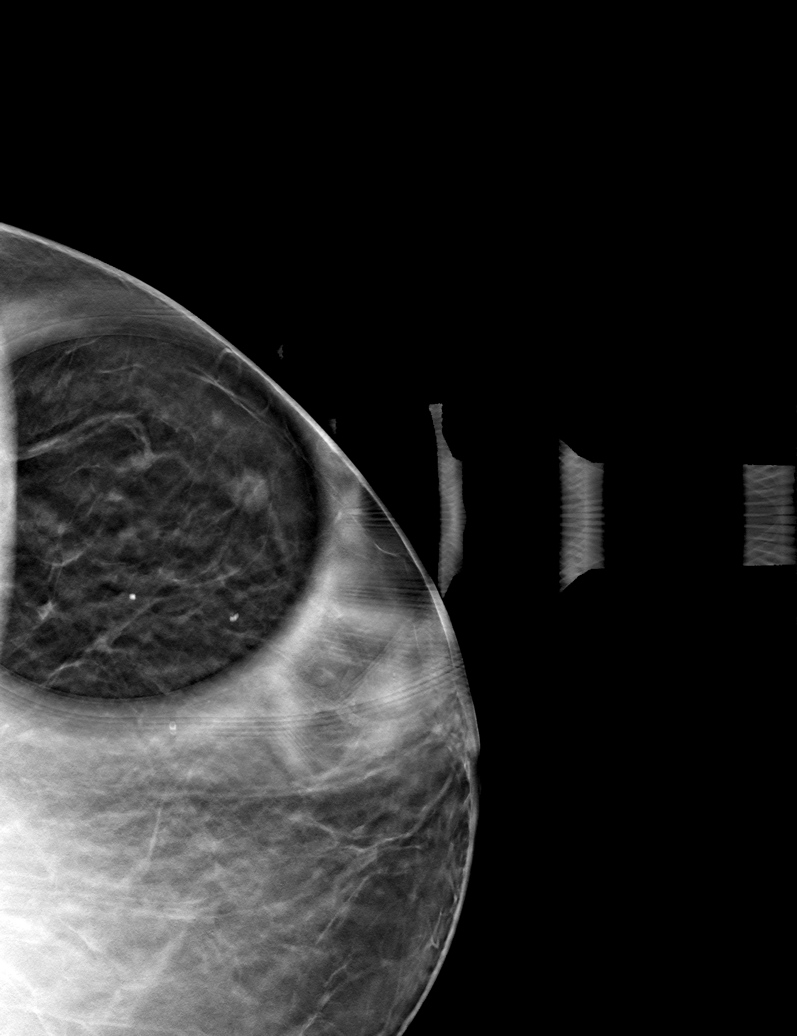

[L ML tomo · tomo slice 36/71.0]
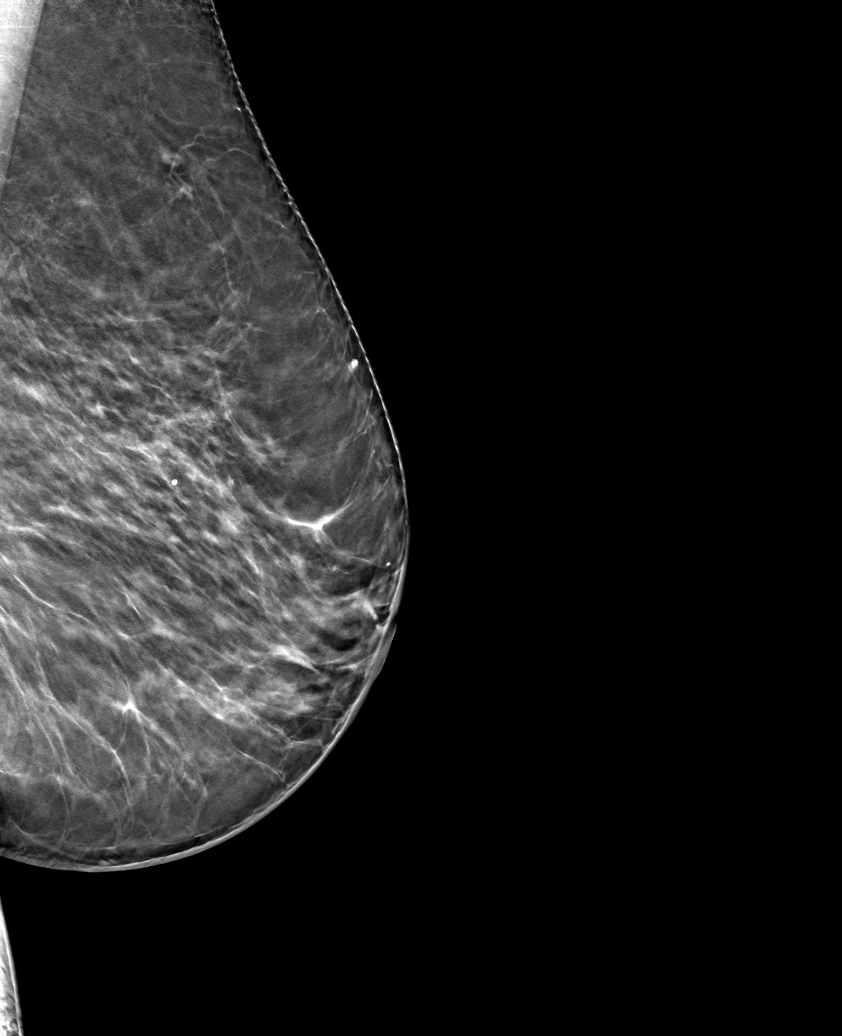

[6 of 18 positions shown; findings below may reference images not displayed]

ACR Breast Density Category b: There are scattered areas of
fibroglandular density.
FINDINGS: Spot compression tomosynthesis images through the lateral left
breast demonstrates 2 adjacent masses. The more anterior mass has a
reniform shape and measures approximately 8 mm. On the patient's
[P7] mammogram, the mass measured 5 mm. Posterior to this on the
current mammogram is an oval circumscribed mass measuring 4 mm.

Ultrasound of the left breast at 3 o'clock, 4 cm from the nipple
demonstrates a benign appearing lymph node with a 2 mm cortex. There
is a preserved fatty hilum. At 3 o'clock, 6 cm from the nipple,
there is a circumscribed oval hypoechoic mass measuring 4 x 3 x 4
mm. There is a hint of an echogenic hilum, and so this may also
represent a lymph node.
IMPRESSION: 1. There is a benign appearing lymph node in the left breast at 3
o'clock, 4 cm from the nipple, which appears slightly larger than on
comparison exams.

2. There is a likely benign 4 mm mass in the left breast at 3
o'clock, 6 cm from the nipple, which may also represent a lymph
node.

RECOMMENDATION:
Three-month follow-up left breast ultrasound.

I have discussed the findings and recommendations with the patient.
If applicable, a reminder letter will be sent to the patient
regarding the next appointment.

BI-RADS CATEGORY  3: Probably benign.

## 2020-09-25 IMAGING — US US BREAST*L* LIMITED INC AXILLA
1 series · 13 of 13 positions shown · non-contrast
Comparison: Previous exam(s).

CLINICAL DATA: Screening recall for possible left breast masses.
The patient has had her COVID booster sometime in early to mid
[place]. No inflammation or infection of the left breast her upper
extremity.

EXAM:
DIGITAL DIAGNOSTIC UNILATERAL LEFT MAMMOGRAM WITH TOMO AND CAD;
ULTRASOUND LEFT BREAST LIMITED
TECHNIQUE: Left digital diagnostic mammography and breast tomosynthesis was
performed. Digital images of the breasts were evaluated with
computer-aided detection.

[Series 1: us breast*left* limited inc axilla · 0.05mm/px · 13 of 13 slices shown]
[im 1/13]
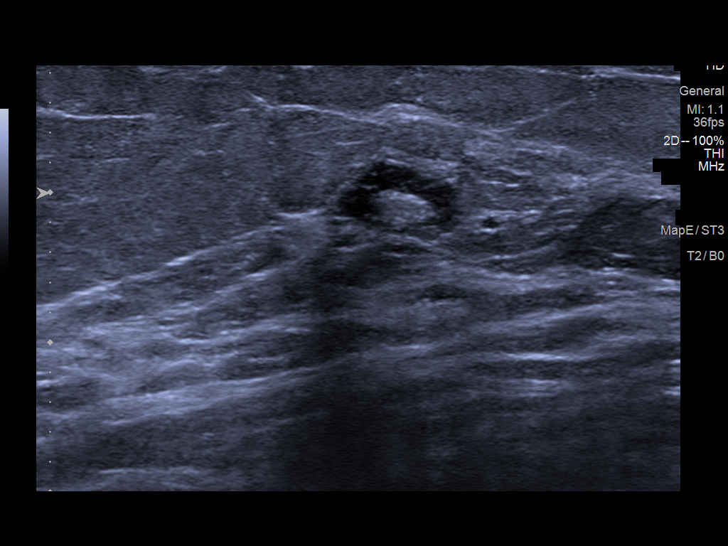
[im 2/13]
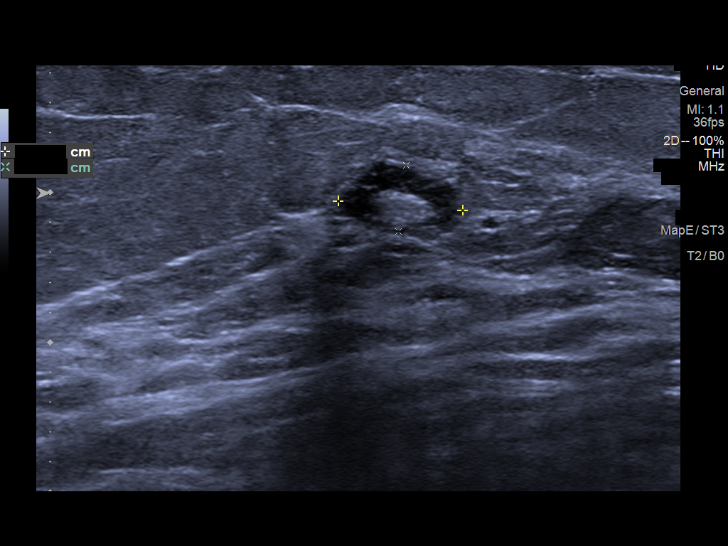
[im 3/13]
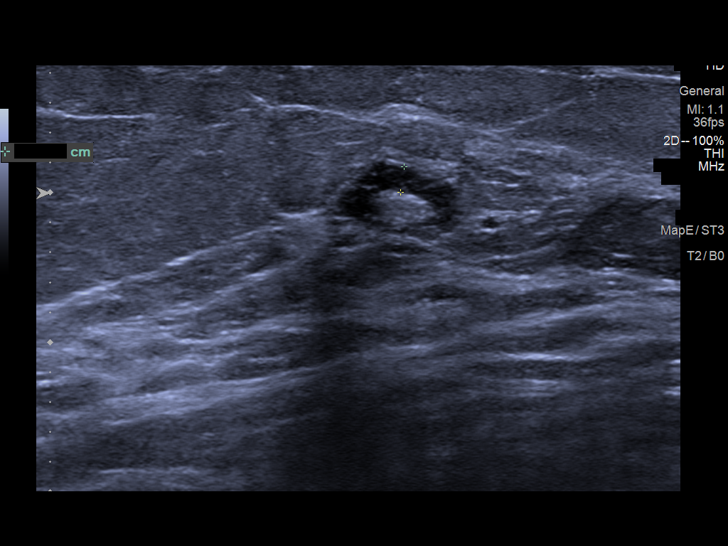
[im 4/13]
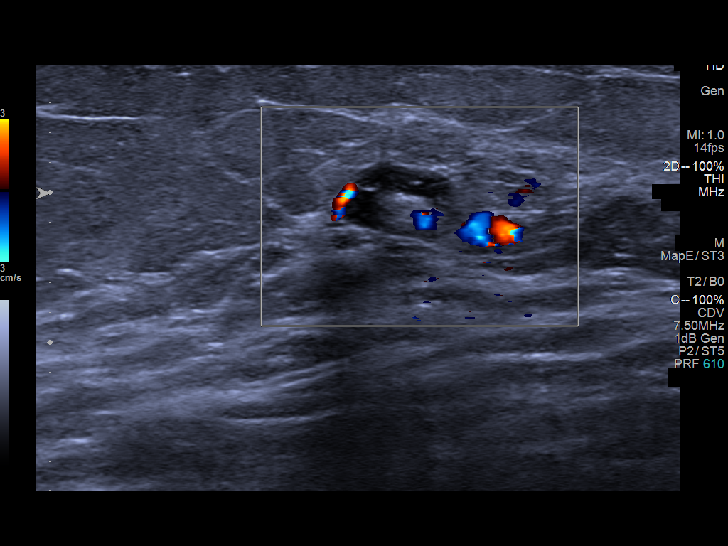
[im 5/13]
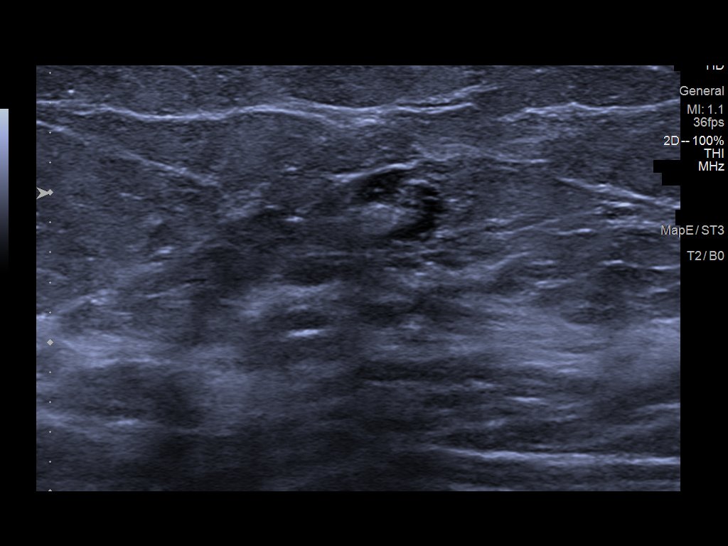
[im 6/13]
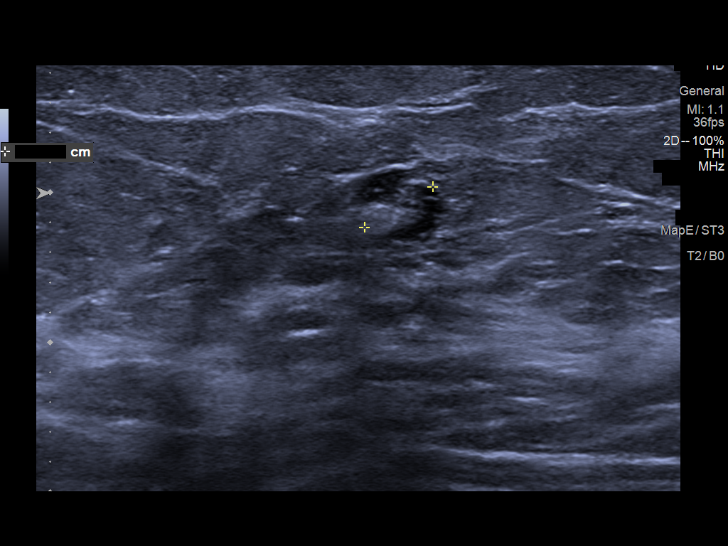
[im 7/13]
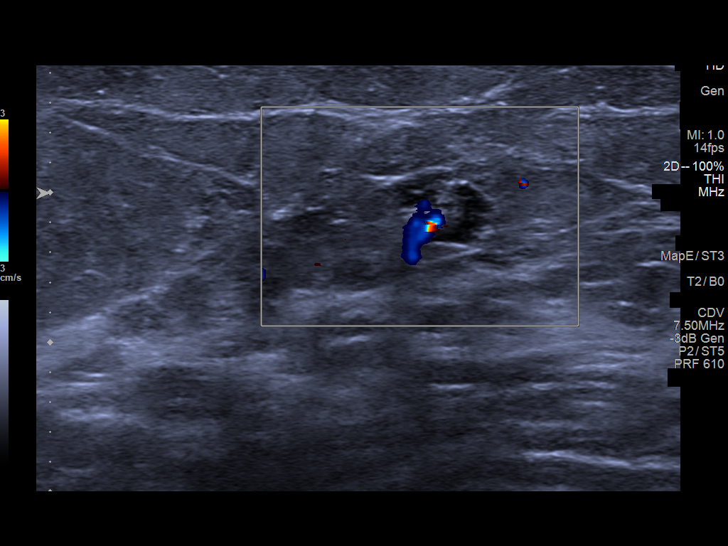
[im 8/13]
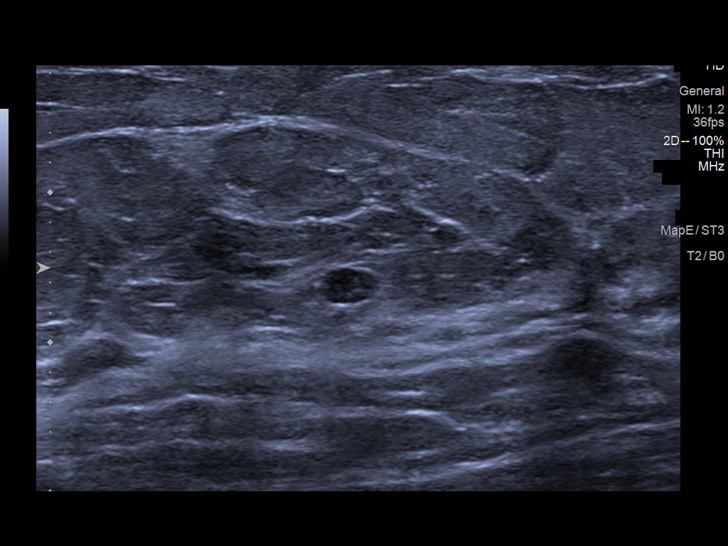
[im 9/13]
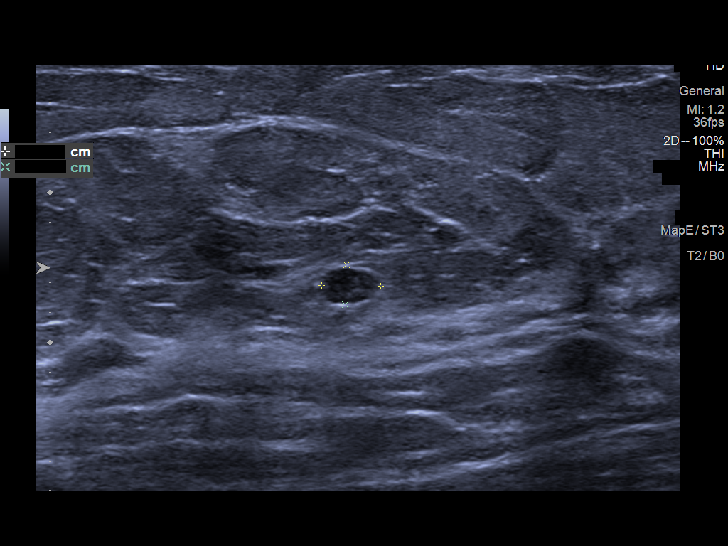
[im 10/13]
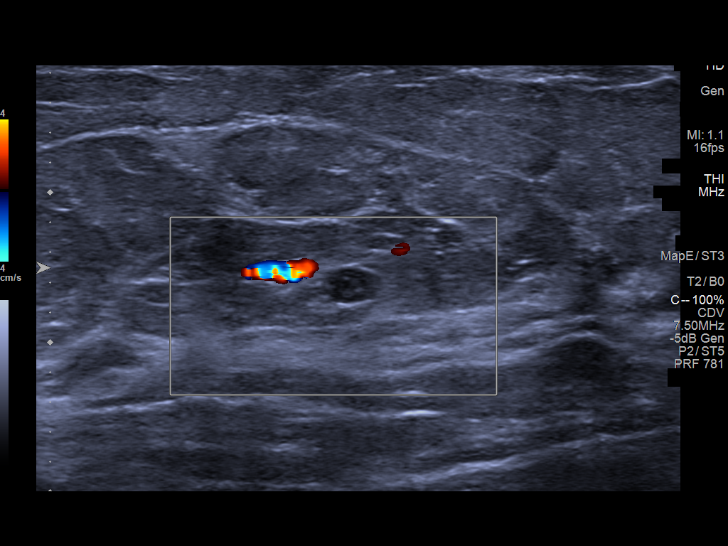
[im 11/13]
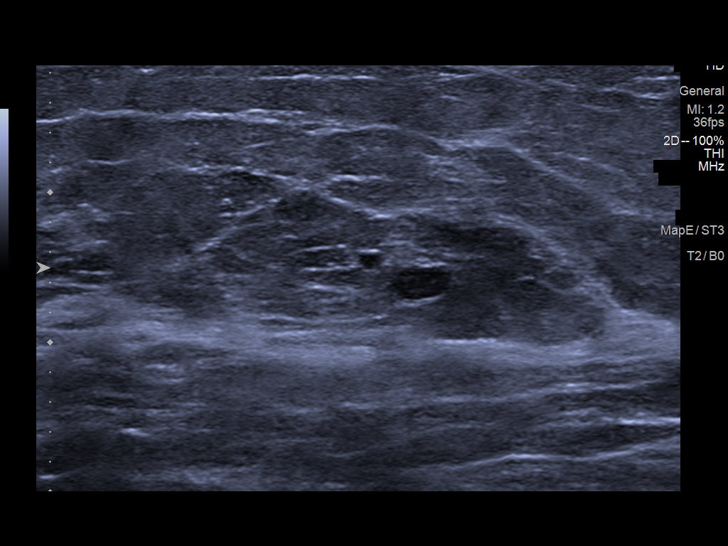
[im 12/13]
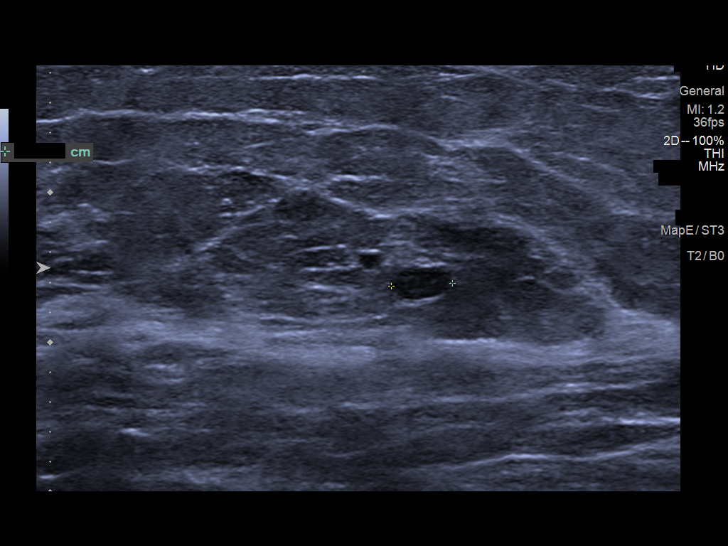
[im 13/13]
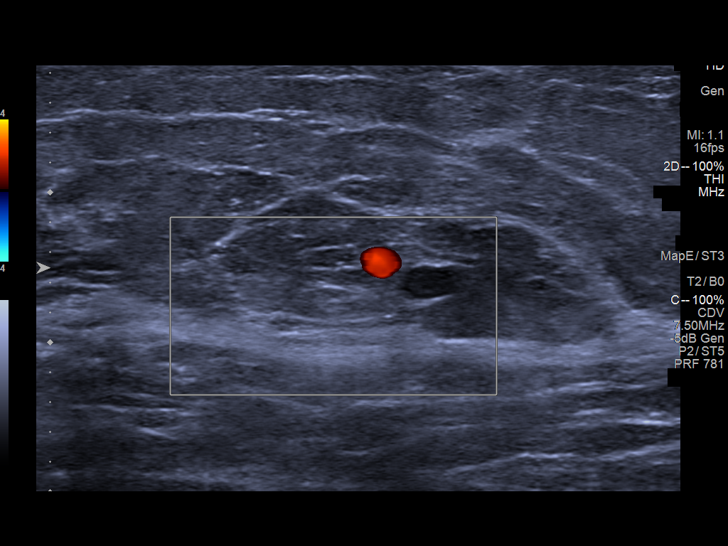

[13 of 13 positions shown; findings below may reference images not displayed]

ACR Breast Density Category b: There are scattered areas of
fibroglandular density.
FINDINGS: Spot compression tomosynthesis images through the lateral left
breast demonstrates 2 adjacent masses. The more anterior mass has a
reniform shape and measures approximately 8 mm. On the patient's
[P7] mammogram, the mass measured 5 mm. Posterior to this on the
current mammogram is an oval circumscribed mass measuring 4 mm.

Ultrasound of the left breast at 3 o'clock, 4 cm from the nipple
demonstrates a benign appearing lymph node with a 2 mm cortex. There
is a preserved fatty hilum. At 3 o'clock, 6 cm from the nipple,
there is a circumscribed oval hypoechoic mass measuring 4 x 3 x 4
mm. There is a hint of an echogenic hilum, and so this may also
represent a lymph node.
IMPRESSION: 1. There is a benign appearing lymph node in the left breast at 3
o'clock, 4 cm from the nipple, which appears slightly larger than on
comparison exams.

2. There is a likely benign 4 mm mass in the left breast at 3
o'clock, 6 cm from the nipple, which may also represent a lymph
node.

RECOMMENDATION:
Three-month follow-up left breast ultrasound.

I have discussed the findings and recommendations with the patient.
If applicable, a reminder letter will be sent to the patient
regarding the next appointment.

BI-RADS CATEGORY  3: Probably benign.

## 2020-11-13 ENCOUNTER — Telehealth: Payer: Self-pay | Admitting: Family Medicine

## 2020-11-13 DIAGNOSIS — N6320 Unspecified lump in the left breast, unspecified quadrant: Secondary | ICD-10-CM | POA: Insufficient documentation

## 2020-11-13 NOTE — Telephone Encounter (Signed)
Patient called in stating she is needing to have imaging at [facility] center redone. Stated when she was there they told her to follow up again in three months around 4/22. Patient is unaware if she needs to repeat Mammogram and [place] or just one of the tests. Please advise and reorder. Call when done as pt unable to schedule till placed.

## 2020-12-29 ENCOUNTER — Ambulatory Visit
Admission: RE | Admit: 2020-12-29 | Discharge: 2020-12-29 | Disposition: A | Discharge status: Home / Self Care | Payer: Medicare PPO | Source: Ambulatory Visit | Attending: Family Medicine | Admitting: Family Medicine

## 2020-12-29 ENCOUNTER — Other Ambulatory Visit: Payer: Self-pay

## 2020-12-29 DIAGNOSIS — N6320 Unspecified lump in the left breast, unspecified quadrant: Secondary | ICD-10-CM | POA: Diagnosis not present

## 2020-12-29 DIAGNOSIS — N6325 Unspecified lump in the left breast, overlapping quadrants: Secondary | ICD-10-CM | POA: Diagnosis not present

## 2020-12-29 IMAGING — US US BREAST*L* LIMITED INC AXILLA
1 series · 10 of 10 positions shown · non-contrast
Comparison: Previous exam(s).

CLINICAL DATA: 70-year-old female for 3 month follow-up of likely
benign LEFT breast lymph nodes/masses. Patient had a COVID vaccine
on the LEFT in [DATE].

EXAM:
ULTRASOUND OF THE LEFT BREAST

[Series 1: us breast*left* limited inc axilla · 0.05mm/px · 10 of 10 slices shown]
[im 1/10]
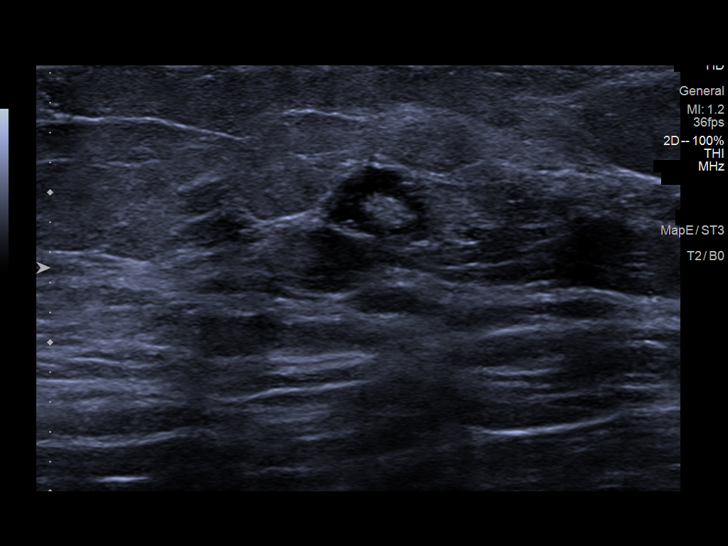
[im 2/10]
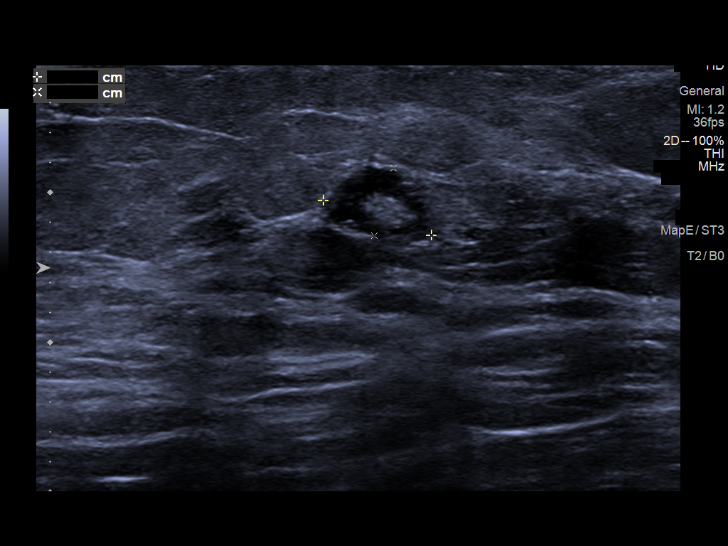
[im 3/10]
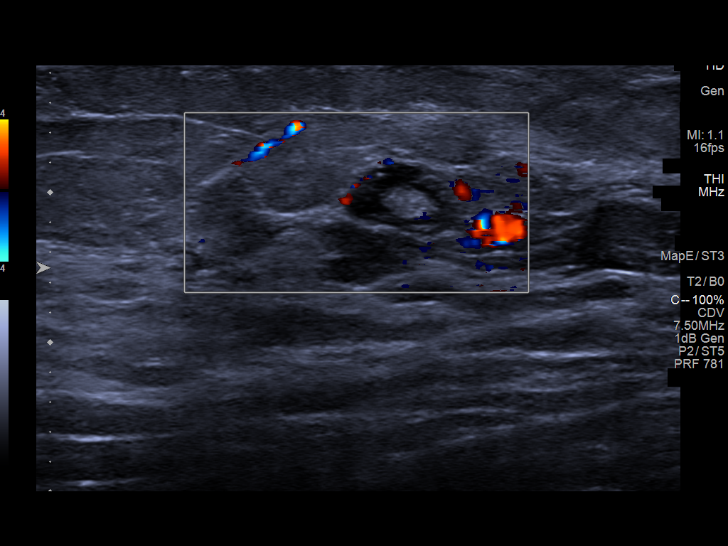
[im 4/10]
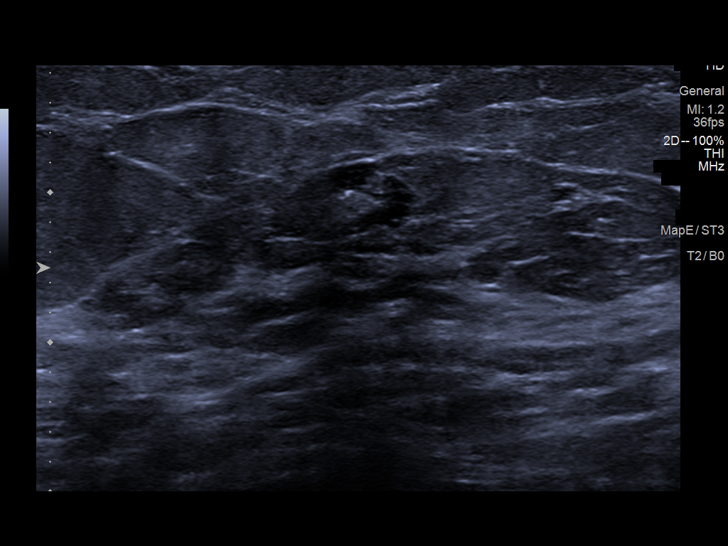
[im 5/10]
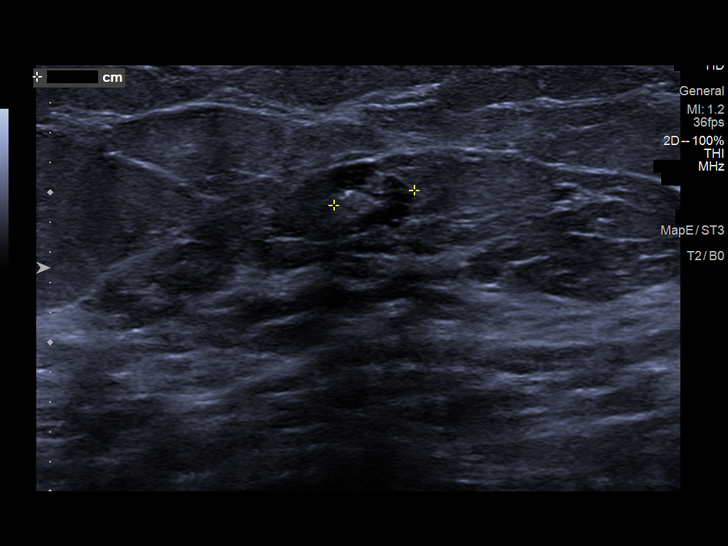
[im 6/10]
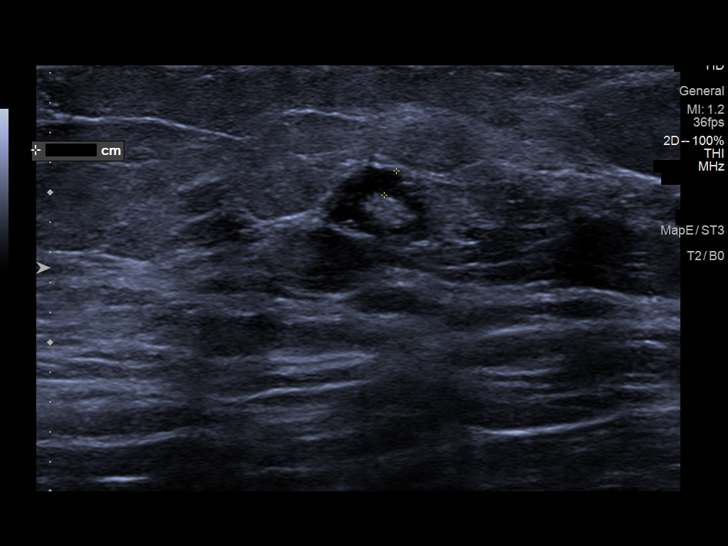
[im 7/10]
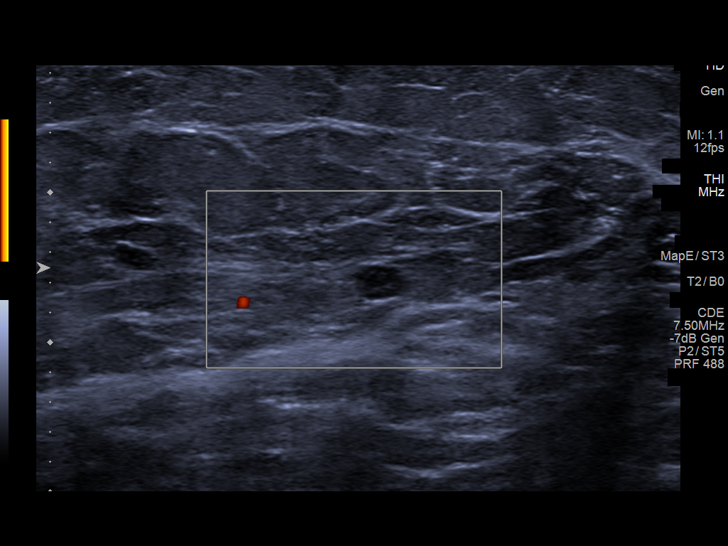
[im 8/10]
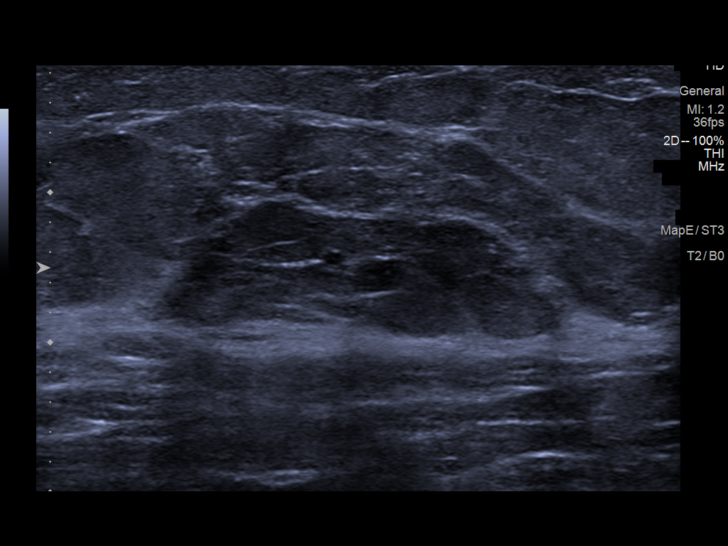
[im 9/10]
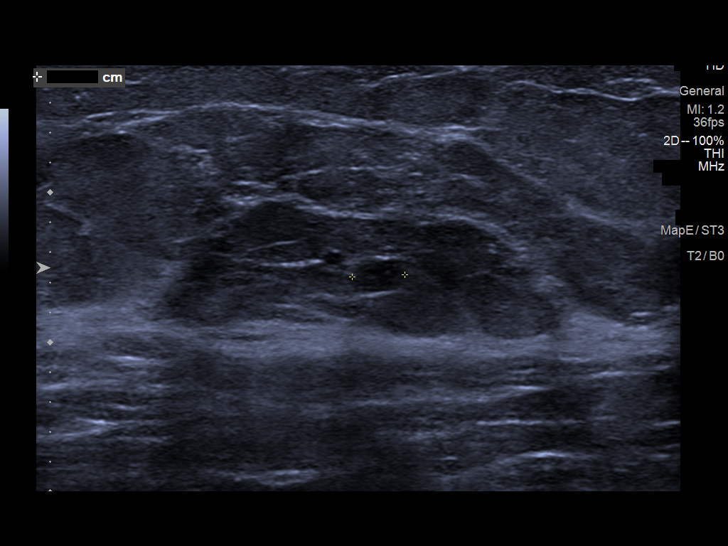
[im 10/10]
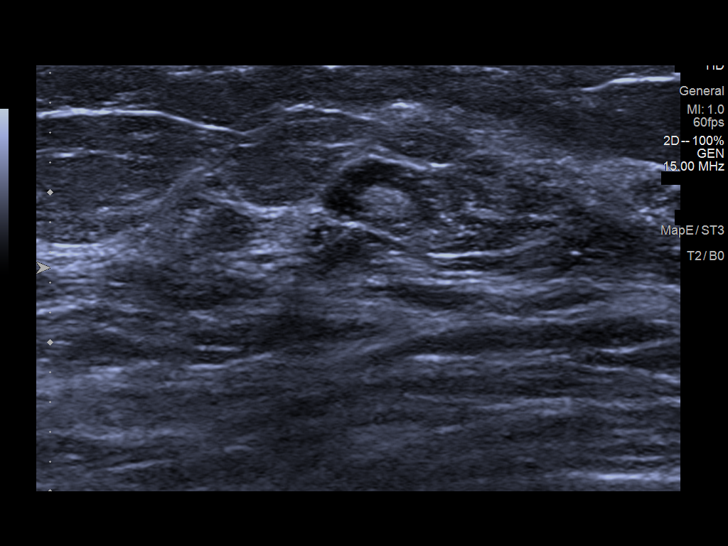

[10 of 10 positions shown; findings below may reference images not displayed]

FINDINGS: Targeted ultrasound is performed, showing an unchanged
intraparenchymal lymph node at the 3 o'clock position of the LEFT
breast 4 cm from the nipple with cortical thickening of 1.8 mm.

A 0.3 x 0.3 x 0.4 cm circumscribed oval very hypoechoic mass at the
3 o'clock position of the LEFT breast 6 cm from the nipple is also
unchanged.
IMPRESSION: 1. Stable intraparenchymal lymph node within the OUTER LEFT breast,
almost certainly reactive given appearance and patient history.
2. Stable 0.4 cm OUTER LEFT breast mass/possible intraparenchymal
lymph node.

Three month follow-up is recommended to ensure
stability/improvement.

RECOMMENDATION:
LEFT breast ultrasound in 3 months.

I have discussed the findings and recommendations with the patient.
If applicable, a reminder letter will be sent to the patient
regarding the next appointment.

BI-RADS CATEGORY  3: Probably benign.

## 2021-03-24 ENCOUNTER — Telehealth: Payer: Self-pay | Admitting: Family Medicine

## 2021-03-24 DIAGNOSIS — N6320 Unspecified lump in the left breast, unspecified quadrant: Secondary | ICD-10-CM

## 2021-03-24 NOTE — Telephone Encounter (Signed)
Left VM letting pt know Dr. Glori Bickers placed order and she can call and make an appt for f/u US

## 2021-03-24 NOTE — Telephone Encounter (Signed)
Deborah Carey called in wanted to know about getting an order to sent to [facility] for 3 breast ultrasound after the 7/26 its going to [email or web address] 

## 2021-03-24 NOTE — Telephone Encounter (Signed)
I placed the order.

## 2021-03-24 NOTE — Telephone Encounter (Signed)
3 month

## 2021-03-24 NOTE — Addendum Note (Signed)
Addended by: Loura Pardon A on: 03/24/2021 03:31 PM   Modules accepted: Orders

## 2021-03-24 NOTE — Telephone Encounter (Signed)
See breast US results from 12/29/20

## 2021-03-31 ENCOUNTER — Other Ambulatory Visit: Payer: Self-pay

## 2021-03-31 ENCOUNTER — Ambulatory Visit
Admission: RE | Admit: 2021-03-31 | Discharge: 2021-03-31 | Disposition: A | Discharge status: Home / Self Care | Payer: Medicare PPO | Source: Ambulatory Visit | Attending: Family Medicine | Admitting: Family Medicine

## 2021-03-31 DIAGNOSIS — N6323 Unspecified lump in the left breast, lower outer quadrant: Secondary | ICD-10-CM | POA: Diagnosis not present

## 2021-03-31 DIAGNOSIS — N6321 Unspecified lump in the left breast, upper outer quadrant: Secondary | ICD-10-CM | POA: Diagnosis not present

## 2021-03-31 DIAGNOSIS — N6320 Unspecified lump in the left breast, unspecified quadrant: Secondary | ICD-10-CM | POA: Insufficient documentation

## 2021-03-31 IMAGING — US US BREAST*L* LIMITED INC AXILLA
1 series · 13 of 13 positions shown · non-contrast
Comparison: Previous exam(s).

CLINICAL DATA: Here for follow-up of an intraparenchymal lymph node
in the left breast and a mass in the left breast.

EXAM:
ULTRASOUND OF THE LEFT BREAST

[Series 1: us breast*left* limited inc axilla · 0.05mm/px · 13 of 13 slices shown]
[im 1/13]
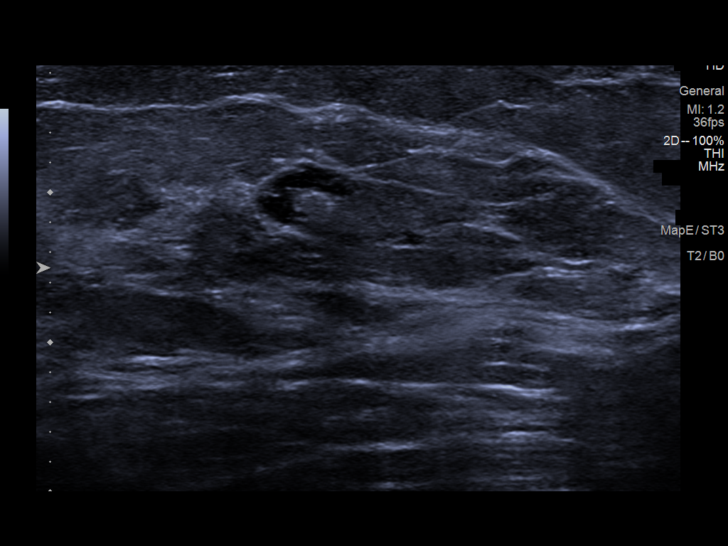
[im 2/13]
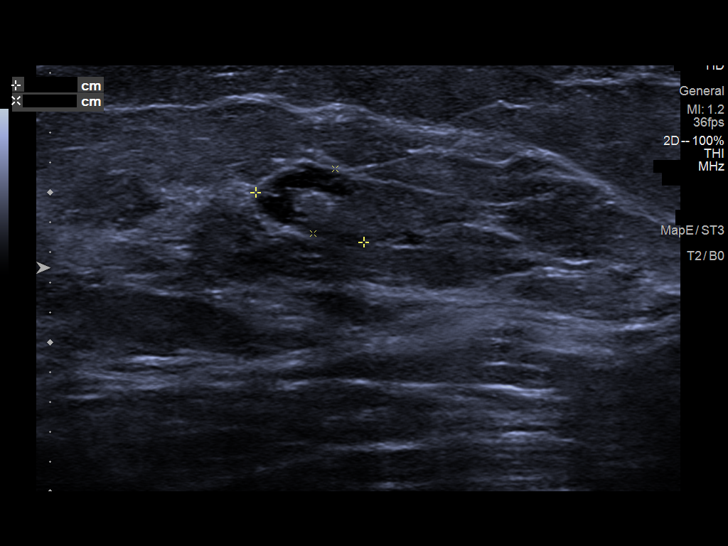
[im 3/13]
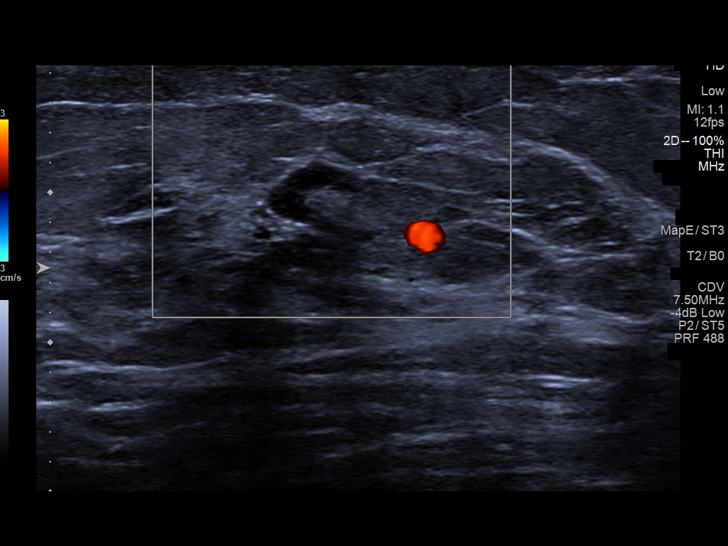
[im 4/13]
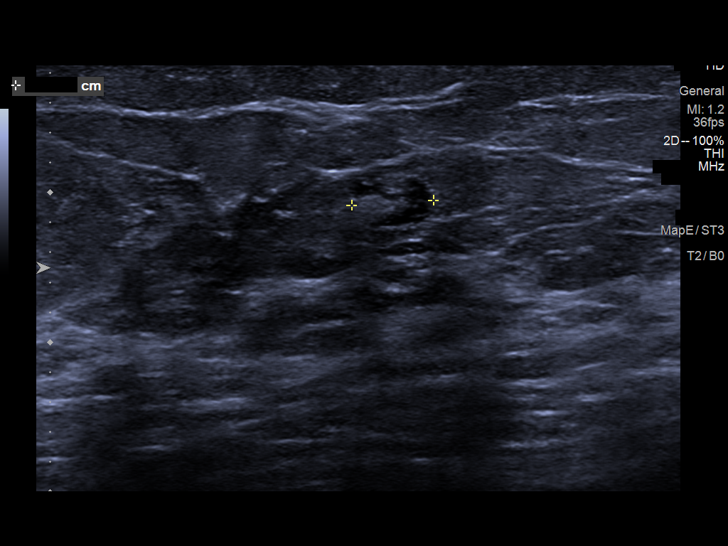
[im 5/13]
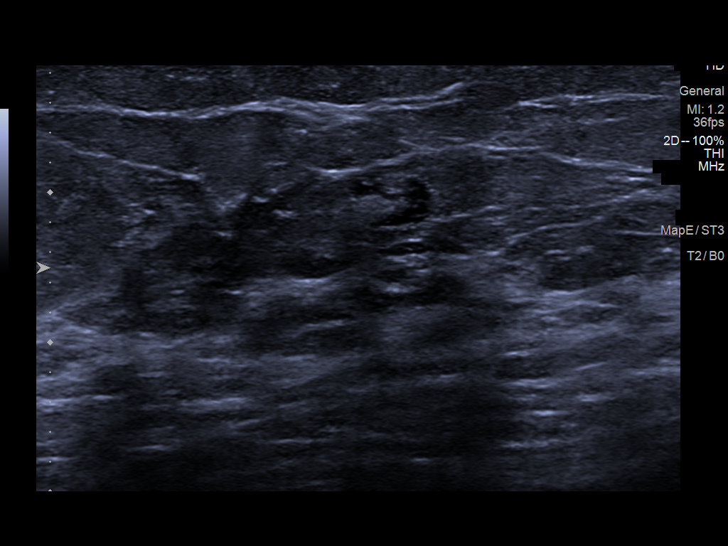
[im 6/13]
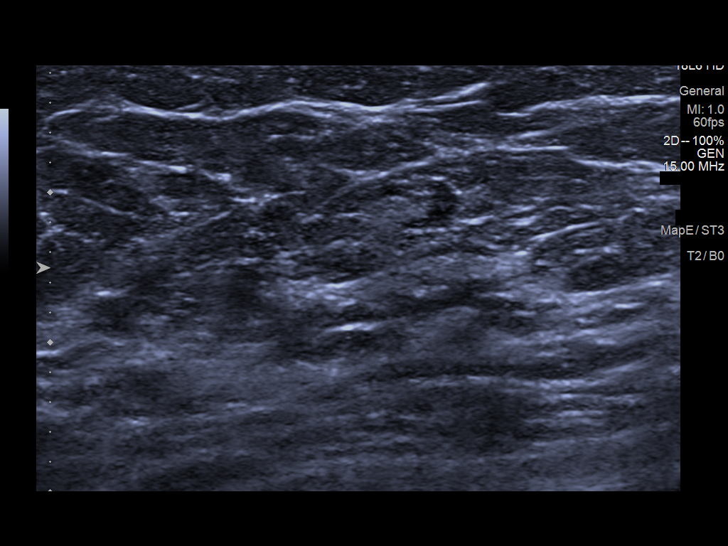
[im 7/13]
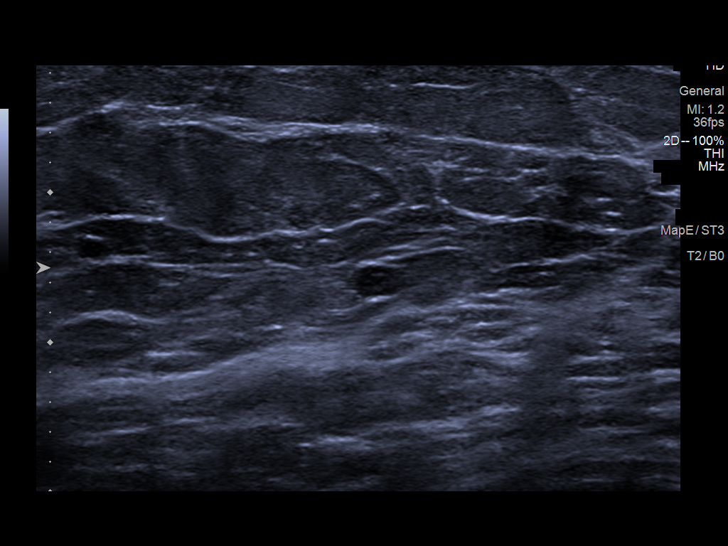
[im 8/13]
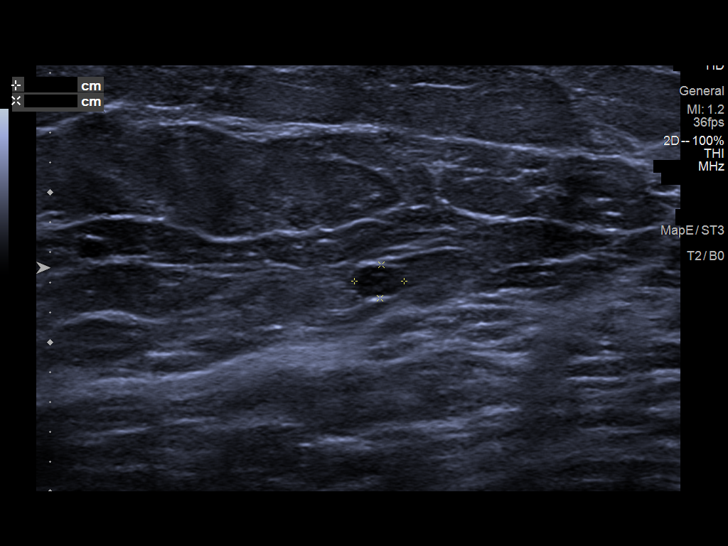
[im 9/13]
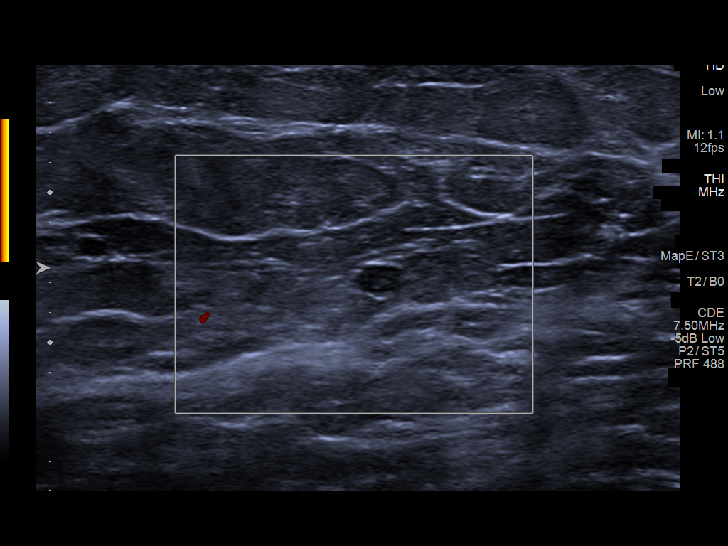
[im 10/13]
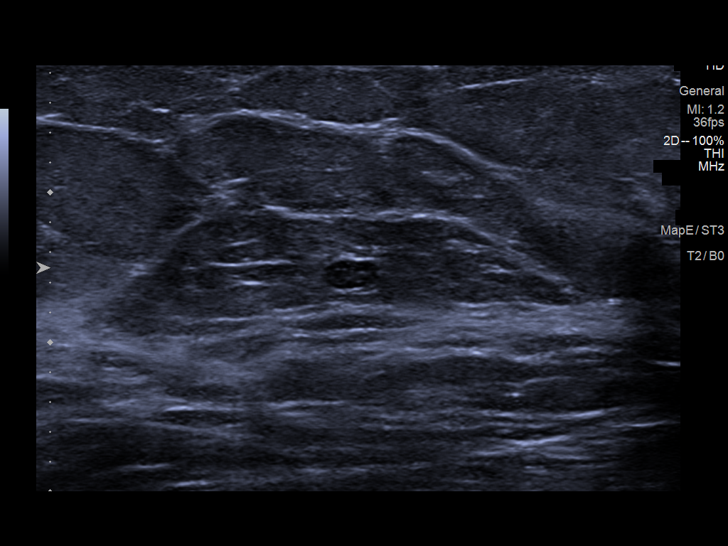
[im 11/13]
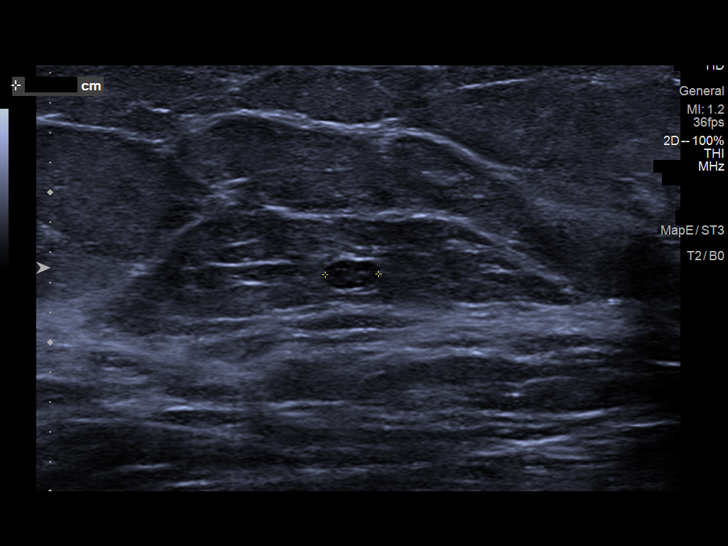
[im 12/13]
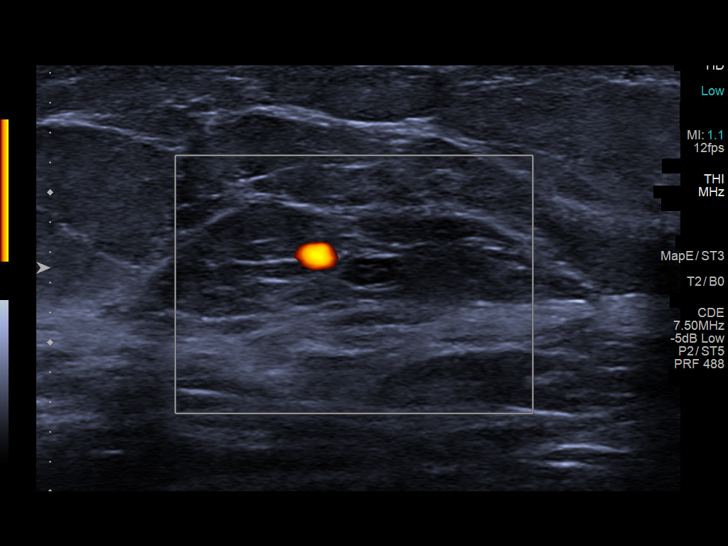
[im 13/13]
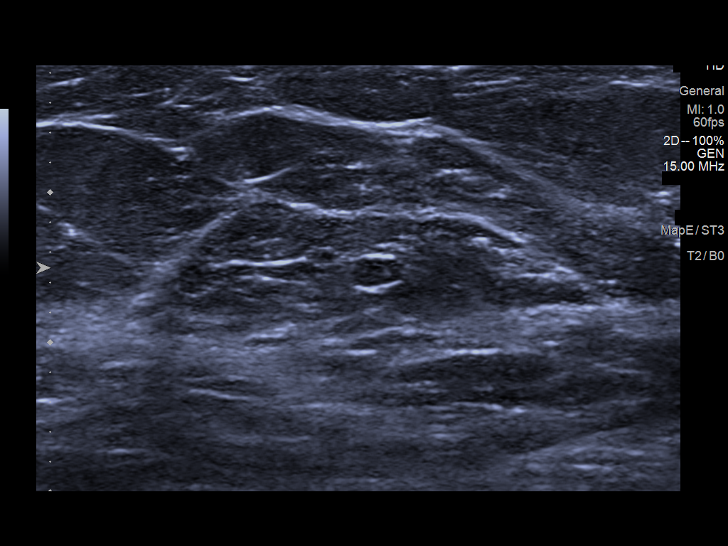

[13 of 13 positions shown; findings below may reference images not displayed]

FINDINGS: Targeted left breast ultrasound was performed.

At 3 o'clock 4 cm from the nipple an intramammary lymph node with
cortex measuring 2 mm and a fatty hilum is redemonstrated. This has
not increased in size since [DATE] and is consistent with a
benign intramammary lymph node.

At 3 o'clock 6 cm from the nipple an oval circumscribed hypoechoic
mass measures 4 x 3 x 4 mm. This is not significantly changed since
[DATE].
IMPRESSION: Circumscribed mass in the left breast at 3 o'clock 6 cm from the
nipple is not significantly changed since [DATE] and remains
probably benign.

RECOMMENDATION:
Recommend follow-up left breast ultrasound in 6 months to document 1
year stability. At that time, the patient will be due for annual
bilateral mammogram.

I have discussed the findings and recommendations with the patient.
If applicable, a reminder letter will be sent to the patient
regarding the next appointment.

BI-RADS CATEGORY  3: Probably benign.

## 2021-04-06 ENCOUNTER — Other Ambulatory Visit: Payer: Medicare PPO

## 2021-05-25 ENCOUNTER — Other Ambulatory Visit (HOSPITAL_COMMUNITY): Payer: Self-pay | Admitting: Neuroradiology

## 2021-05-25 ENCOUNTER — Inpatient Hospital Stay: Admission: RE | Admit: 2021-05-25 | Payer: Medicare PPO | Source: Ambulatory Visit | Admitting: Interventional Radiology

## 2021-05-25 ENCOUNTER — Emergency Department: Payer: Medicare PPO

## 2021-05-25 ENCOUNTER — Encounter (HOSPITAL_COMMUNITY)
Admission: EM | Disposition: A | Discharge status: Rehabilitation Facility | Payer: Self-pay | Source: Other Acute Inpatient Hospital | Attending: Neurology

## 2021-05-25 ENCOUNTER — Emergency Department (HOSPITAL_COMMUNITY): Payer: Medicare PPO | Admitting: Certified Registered Nurse Anesthetist

## 2021-05-25 ENCOUNTER — Inpatient Hospital Stay (HOSPITAL_COMMUNITY)
Admission: EM | Admit: 2021-05-25 | Discharge: 2021-06-01 | DRG: 023 | Disposition: A | Discharge status: Rehabilitation Facility | Payer: Medicare PPO | Source: Other Acute Inpatient Hospital | Attending: Neurology | Admitting: Neurology

## 2021-05-25 ENCOUNTER — Encounter (HOSPITAL_COMMUNITY): Payer: Self-pay | Admitting: Interventional Radiology

## 2021-05-25 ENCOUNTER — Other Ambulatory Visit: Payer: Self-pay

## 2021-05-25 ENCOUNTER — Emergency Department
Admission: EM | Admit: 2021-05-25 | Discharge: 2021-05-25 | Disposition: A | Discharge status: Other Acute Inpatient Hospital | Payer: Medicare PPO | Attending: Emergency Medicine | Admitting: Emergency Medicine

## 2021-05-25 ENCOUNTER — Ambulatory Visit (HOSPITAL_COMMUNITY)
Admission: RE | Admit: 2021-05-25 | Discharge: 2021-05-25 | Disposition: A | Discharge status: Home / Self Care | Payer: Medicare PPO | Source: Ambulatory Visit | Attending: Neuroradiology | Admitting: Neuroradiology

## 2021-05-25 DIAGNOSIS — Z9289 Personal history of other medical treatment: Secondary | ICD-10-CM

## 2021-05-25 DIAGNOSIS — Z6836 Body mass index (BMI) 36.0-36.9, adult: Secondary | ICD-10-CM

## 2021-05-25 DIAGNOSIS — R7303 Prediabetes: Secondary | ICD-10-CM | POA: Diagnosis present

## 2021-05-25 DIAGNOSIS — Z978 Presence of other specified devices: Secondary | ICD-10-CM

## 2021-05-25 DIAGNOSIS — R0602 Shortness of breath: Secondary | ICD-10-CM | POA: Diagnosis not present

## 2021-05-25 DIAGNOSIS — R29810 Facial weakness: Secondary | ICD-10-CM | POA: Diagnosis present

## 2021-05-25 DIAGNOSIS — J9601 Acute respiratory failure with hypoxia: Secondary | ICD-10-CM | POA: Diagnosis present

## 2021-05-25 DIAGNOSIS — I639 Cerebral infarction, unspecified: Secondary | ICD-10-CM | POA: Insufficient documentation

## 2021-05-25 DIAGNOSIS — I69351 Hemiplegia and hemiparesis following cerebral infarction affecting right dominant side: Secondary | ICD-10-CM | POA: Diagnosis not present

## 2021-05-25 DIAGNOSIS — I4891 Unspecified atrial fibrillation: Secondary | ICD-10-CM | POA: Diagnosis not present

## 2021-05-25 DIAGNOSIS — Z23 Encounter for immunization: Secondary | ICD-10-CM | POA: Diagnosis not present

## 2021-05-25 DIAGNOSIS — G928 Other toxic encephalopathy: Secondary | ICD-10-CM | POA: Diagnosis present

## 2021-05-25 DIAGNOSIS — I9589 Other hypotension: Secondary | ICD-10-CM | POA: Diagnosis not present

## 2021-05-25 DIAGNOSIS — Z9119 Patient's noncompliance with other medical treatment and regimen: Secondary | ICD-10-CM | POA: Diagnosis not present

## 2021-05-25 DIAGNOSIS — E669 Obesity, unspecified: Secondary | ICD-10-CM | POA: Diagnosis present

## 2021-05-25 DIAGNOSIS — J309 Allergic rhinitis, unspecified: Secondary | ICD-10-CM | POA: Diagnosis not present

## 2021-05-25 DIAGNOSIS — Z7901 Long term (current) use of anticoagulants: Secondary | ICD-10-CM

## 2021-05-25 DIAGNOSIS — R339 Retention of urine, unspecified: Secondary | ICD-10-CM | POA: Diagnosis present

## 2021-05-25 DIAGNOSIS — R1313 Dysphagia, pharyngeal phase: Secondary | ICD-10-CM | POA: Diagnosis present

## 2021-05-25 DIAGNOSIS — I6389 Other cerebral infarction: Secondary | ICD-10-CM | POA: Diagnosis not present

## 2021-05-25 DIAGNOSIS — Z8249 Family history of ischemic heart disease and other diseases of the circulatory system: Secondary | ICD-10-CM

## 2021-05-25 DIAGNOSIS — G8191 Hemiplegia, unspecified affecting right dominant side: Secondary | ICD-10-CM | POA: Diagnosis not present

## 2021-05-25 DIAGNOSIS — G4700 Insomnia, unspecified: Secondary | ICD-10-CM | POA: Diagnosis present

## 2021-05-25 DIAGNOSIS — I4892 Unspecified atrial flutter: Secondary | ICD-10-CM | POA: Diagnosis present

## 2021-05-25 DIAGNOSIS — I63412 Cerebral infarction due to embolism of left middle cerebral artery: Secondary | ICD-10-CM | POA: Diagnosis not present

## 2021-05-25 DIAGNOSIS — R0902 Hypoxemia: Secondary | ICD-10-CM | POA: Diagnosis not present

## 2021-05-25 DIAGNOSIS — Z4682 Encounter for fitting and adjustment of non-vascular catheter: Secondary | ICD-10-CM | POA: Diagnosis not present

## 2021-05-25 DIAGNOSIS — Z823 Family history of stroke: Secondary | ICD-10-CM | POA: Diagnosis not present

## 2021-05-25 DIAGNOSIS — I63513 Cerebral infarction due to unspecified occlusion or stenosis of bilateral middle cerebral arteries: Secondary | ICD-10-CM | POA: Diagnosis not present

## 2021-05-25 DIAGNOSIS — Z20822 Contact with and (suspected) exposure to covid-19: Secondary | ICD-10-CM | POA: Diagnosis not present

## 2021-05-25 DIAGNOSIS — R1312 Dysphagia, oropharyngeal phase: Secondary | ICD-10-CM | POA: Diagnosis not present

## 2021-05-25 DIAGNOSIS — R4182 Altered mental status, unspecified: Secondary | ICD-10-CM | POA: Diagnosis present

## 2021-05-25 DIAGNOSIS — I69391 Dysphagia following cerebral infarction: Secondary | ICD-10-CM | POA: Diagnosis not present

## 2021-05-25 DIAGNOSIS — I4819 Other persistent atrial fibrillation: Secondary | ICD-10-CM | POA: Diagnosis not present

## 2021-05-25 DIAGNOSIS — I6602 Occlusion and stenosis of left middle cerebral artery: Secondary | ICD-10-CM | POA: Diagnosis not present

## 2021-05-25 DIAGNOSIS — R0689 Other abnormalities of breathing: Secondary | ICD-10-CM

## 2021-05-25 DIAGNOSIS — I69320 Aphasia following cerebral infarction: Secondary | ICD-10-CM | POA: Diagnosis not present

## 2021-05-25 DIAGNOSIS — R791 Abnormal coagulation profile: Secondary | ICD-10-CM | POA: Diagnosis present

## 2021-05-25 DIAGNOSIS — E7800 Pure hypercholesterolemia, unspecified: Secondary | ICD-10-CM | POA: Diagnosis not present

## 2021-05-25 DIAGNOSIS — I10 Essential (primary) hypertension: Secondary | ICD-10-CM | POA: Diagnosis present

## 2021-05-25 DIAGNOSIS — G936 Cerebral edema: Secondary | ICD-10-CM | POA: Diagnosis not present

## 2021-05-25 DIAGNOSIS — R1110 Vomiting, unspecified: Secondary | ICD-10-CM

## 2021-05-25 DIAGNOSIS — E785 Hyperlipidemia, unspecified: Secondary | ICD-10-CM | POA: Diagnosis present

## 2021-05-25 DIAGNOSIS — K8020 Calculus of gallbladder without cholecystitis without obstruction: Secondary | ICD-10-CM | POA: Diagnosis not present

## 2021-05-25 DIAGNOSIS — Z9889 Other specified postprocedural states: Secondary | ICD-10-CM | POA: Diagnosis not present

## 2021-05-25 DIAGNOSIS — R4701 Aphasia: Secondary | ICD-10-CM | POA: Diagnosis not present

## 2021-05-25 DIAGNOSIS — I63413 Cerebral infarction due to embolism of bilateral middle cerebral arteries: Secondary | ICD-10-CM | POA: Diagnosis not present

## 2021-05-25 DIAGNOSIS — Z9282 Status post administration of tPA (rtPA) in a different facility within the last 24 hours prior to admission to current facility: Secondary | ICD-10-CM | POA: Diagnosis not present

## 2021-05-25 DIAGNOSIS — I6522 Occlusion and stenosis of left carotid artery: Secondary | ICD-10-CM | POA: Diagnosis not present

## 2021-05-25 DIAGNOSIS — K429 Umbilical hernia without obstruction or gangrene: Secondary | ICD-10-CM | POA: Diagnosis not present

## 2021-05-25 DIAGNOSIS — I63512 Cerebral infarction due to unspecified occlusion or stenosis of left middle cerebral artery: Secondary | ICD-10-CM | POA: Diagnosis not present

## 2021-05-25 DIAGNOSIS — I6359 Cerebral infarction due to unspecified occlusion or stenosis of other cerebral artery: Secondary | ICD-10-CM

## 2021-05-25 DIAGNOSIS — I480 Paroxysmal atrial fibrillation: Secondary | ICD-10-CM | POA: Diagnosis not present

## 2021-05-25 DIAGNOSIS — R29719 NIHSS score 19: Secondary | ICD-10-CM | POA: Diagnosis present

## 2021-05-25 DIAGNOSIS — Z8673 Personal history of transient ischemic attack (TIA), and cerebral infarction without residual deficits: Secondary | ICD-10-CM

## 2021-05-25 DIAGNOSIS — I161 Hypertensive emergency: Secondary | ICD-10-CM | POA: Diagnosis not present

## 2021-05-25 DIAGNOSIS — K5730 Diverticulosis of large intestine without perforation or abscess without bleeding: Secondary | ICD-10-CM | POA: Diagnosis not present

## 2021-05-25 DIAGNOSIS — R9431 Abnormal electrocardiogram [ECG] [EKG]: Secondary | ICD-10-CM | POA: Diagnosis not present

## 2021-05-25 DIAGNOSIS — K439 Ventral hernia without obstruction or gangrene: Secondary | ICD-10-CM | POA: Diagnosis not present

## 2021-05-25 DIAGNOSIS — I63312 Cerebral infarction due to thrombosis of left middle cerebral artery: Secondary | ICD-10-CM

## 2021-05-25 DIAGNOSIS — I69392 Facial weakness following cerebral infarction: Secondary | ICD-10-CM | POA: Diagnosis not present

## 2021-05-25 HISTORY — PX: IR CT HEAD LTD: IMG2386

## 2021-05-25 HISTORY — PX: IR PERCUTANEOUS ART THROMBECTOMY/INFUSION INTRACRANIAL INC DIAG ANGIO: IMG6087

## 2021-05-25 HISTORY — PX: IR US GUIDE VASC ACCESS RIGHT: IMG2390

## 2021-05-25 HISTORY — PX: RADIOLOGY WITH ANESTHESIA: SHX6223

## 2021-05-25 LAB — DIFFERENTIAL
Abs Immature Granulocytes: 0.03 10*3/uL (ref 0.00–0.07)
Basophils Absolute: 0.1 10*3/uL (ref 0.0–0.1)
Basophils Relative: 1 %
Eosinophils Absolute: 0.1 10*3/uL (ref 0.0–0.5)
Eosinophils Relative: 1 %
Immature Granulocytes: 0 %
Lymphocytes Relative: 28 %
Lymphs Abs: 2.8 10*3/uL (ref 0.7–4.0)
Monocytes Absolute: 1 10*3/uL (ref 0.1–1.0)
Monocytes Relative: 10 %
Neutro Abs: 5.9 10*3/uL (ref 1.7–7.7)
Neutrophils Relative %: 60 %

## 2021-05-25 LAB — GLUCOSE, CAPILLARY
Glucose-Capillary: 112 mg/dL — ABNORMAL HIGH (ref 70–99)
Glucose-Capillary: 142 mg/dL — ABNORMAL HIGH (ref 70–99)

## 2021-05-25 LAB — CBC
HCT: 40.3 % (ref 36.0–46.0)
Hemoglobin: 14.8 g/dL (ref 12.0–15.0)
MCH: 33.9 pg (ref 26.0–34.0)
MCHC: 36.7 g/dL — ABNORMAL HIGH (ref 30.0–36.0)
MCV: 92.2 fL (ref 80.0–100.0)
Platelets: 267 10*3/uL (ref 150–400)
RBC: 4.37 MIL/uL (ref 3.87–5.11)
RDW: 12.3 % (ref 11.5–15.5)
WBC: 10 10*3/uL (ref 4.0–10.5)
nRBC: 0 % (ref 0.0–0.2)

## 2021-05-25 LAB — COMPREHENSIVE METABOLIC PANEL
ALT: 16 U/L (ref 0–44)
AST: 20 U/L (ref 15–41)
Albumin: 4.2 g/dL (ref 3.5–5.0)
Alkaline Phosphatase: 68 U/L (ref 38–126)
Anion gap: 10 (ref 5–15)
BUN: 18 mg/dL (ref 8–23)
CO2: 26 mmol/L (ref 22–32)
Calcium: 9.4 mg/dL (ref 8.9–10.3)
Chloride: 102 mmol/L (ref 98–111)
Creatinine, Ser: 0.83 mg/dL (ref 0.44–1.00)
GFR, Estimated: >60 mL/min (ref >60)
Glucose, Bld: 128 mg/dL — ABNORMAL HIGH (ref 70–99)
Potassium: 4 mmol/L (ref 3.5–5.1)
Sodium: 138 mmol/L (ref 135–145)
Total Bilirubin: 0.8 mg/dL (ref 0.3–1.2)
Total Protein: 7.3 g/dL (ref 6.5–8.1)

## 2021-05-25 LAB — POCT I-STAT 7, (LYTES, BLD GAS, ICA,H+H)
Acid-Base Excess: 5 mmol/L — ABNORMAL HIGH (ref 0.0–2.0)
Bicarbonate: 31.5 mmol/L — ABNORMAL HIGH (ref 20.0–28.0)
Calcium, Ion: 1.24 mmol/L (ref 1.15–1.40)
HCT: 38 % (ref 36.0–46.0)
Hemoglobin: 12.9 g/dL (ref 12.0–15.0)
O2 Saturation: 100 %
Patient temperature: 97.5
Potassium: 4.1 mmol/L (ref 3.5–5.1)
Sodium: 138 mmol/L (ref 135–145)
TCO2: 33 mmol/L — ABNORMAL HIGH (ref 22–32)
pCO2 arterial: 52.1 mmHg — ABNORMAL HIGH (ref 32.0–48.0)
pH, Arterial: 7.386 (ref 7.350–7.450)
pO2, Arterial: 483 mmHg — ABNORMAL HIGH (ref 83.0–108.0)

## 2021-05-25 LAB — ETHANOL: Alcohol, Ethyl (B): <10 mg/dL (ref <10)

## 2021-05-25 LAB — BLOOD GAS, VENOUS
Acid-Base Excess: 2 mmol/L (ref 0.0–2.0)
Bicarbonate: 26.5 mmol/L (ref 20.0–28.0)
O2 Saturation: 88.6 %
Patient temperature: 37
pCO2, Ven: 40 mmHg — ABNORMAL LOW (ref 44.0–60.0)
pH, Ven: 7.43 (ref 7.250–7.430)
pO2, Ven: 54 mmHg — ABNORMAL HIGH (ref 32.0–45.0)

## 2021-05-25 LAB — TROPONIN I (HIGH SENSITIVITY): Troponin I (High Sensitivity): 7 ng/L (ref <18)

## 2021-05-25 LAB — RESP PANEL BY RT-PCR (FLU A&B, COVID) ARPGX2
Influenza A by PCR: NEGATIVE
Influenza B by PCR: NEGATIVE
SARS Coronavirus 2 by RT PCR: NEGATIVE

## 2021-05-25 LAB — PROTIME-INR
INR: 0.9 (ref 0.8–1.2)
Prothrombin Time: 12.6 seconds (ref 11.4–15.2)

## 2021-05-25 LAB — PHOSPHORUS: Phosphorus: 3.6 mg/dL (ref 2.5–4.6)

## 2021-05-25 LAB — APTT: aPTT: 22 seconds — ABNORMAL LOW (ref 24–36)

## 2021-05-25 LAB — MAGNESIUM: Magnesium: 2.1 mg/dL (ref 1.7–2.4)

## 2021-05-25 IMAGING — CT CT HEAD CODE STROKE
3 series · 14 of 47 positions shown, 16 images · IV contrast (omnipaque)
Comparison: None.

CLINICAL DATA: Left-sided weakness

EXAM:
CT ANGIOGRAPHY HEAD AND NECK
TECHNIQUE: Multidetector CT imaging of the head and neck was performed using
the standard protocol during bolus administration of intravenous
contrast. Multiplanar CT image reconstructions and MIPs were
obtained to evaluate the vascular anatomy. Carotid stenosis
measurements (when applicable) are obtained utilizing NASCET
criteria, using the distal internal carotid diameter as the
denominator.
CONTRAST:  75mL OMNIPAQUE IOHEXOL 350 MG/ML SOLN

[Series 4: head wo · axial · 0.41mm/px · z∈[+1012,+1147]mm · 8 of 33 slices shown, 10 images]
[im 3/33  brain]
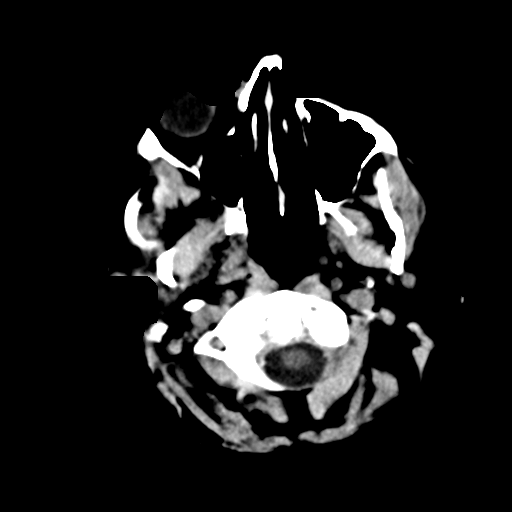
[im 3/33  bone]
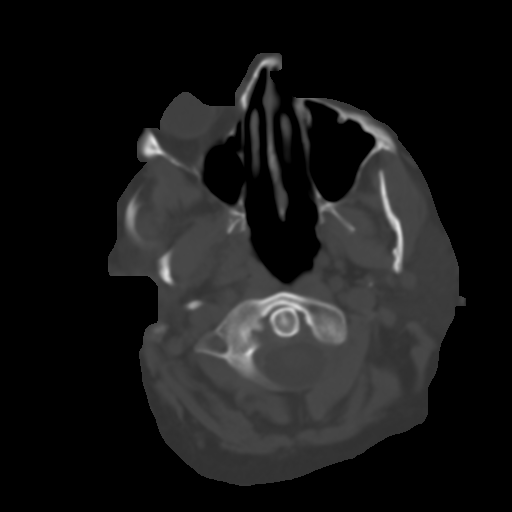
[im 7/33  brain]
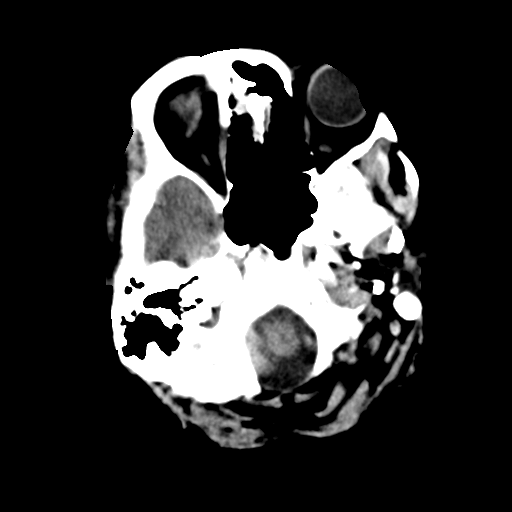
[im 10/33  brain]
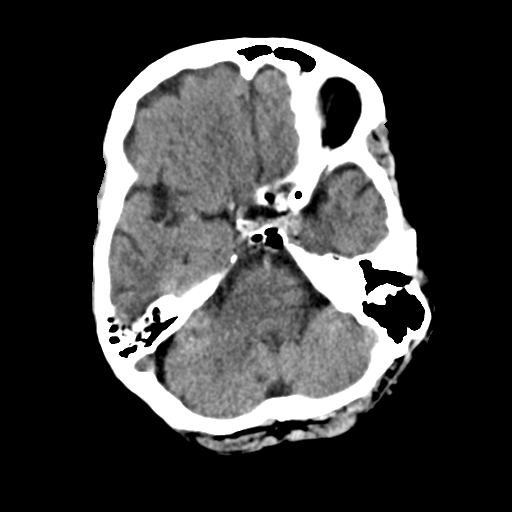
[im 15/33  brain]
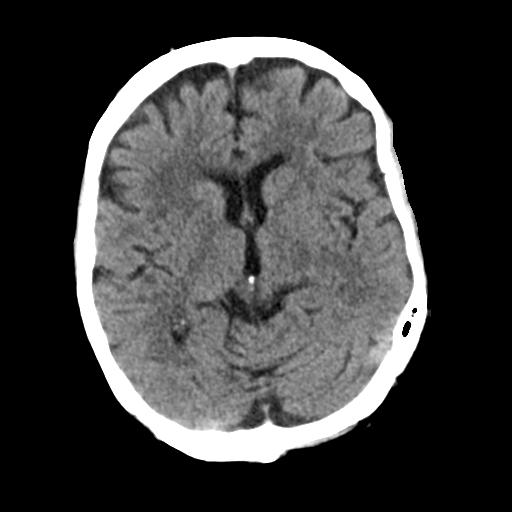
[im 18/33  brain]
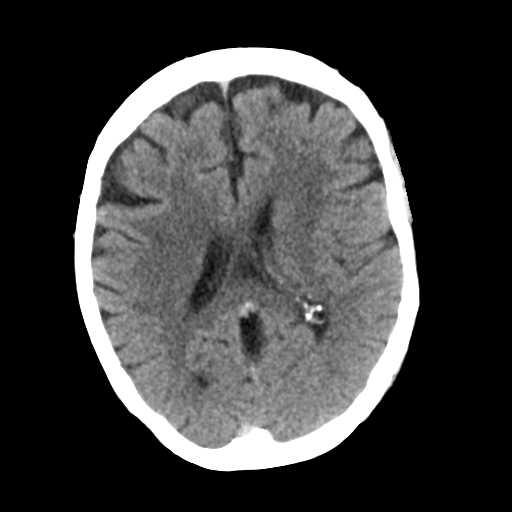
[im 18/33  bone]
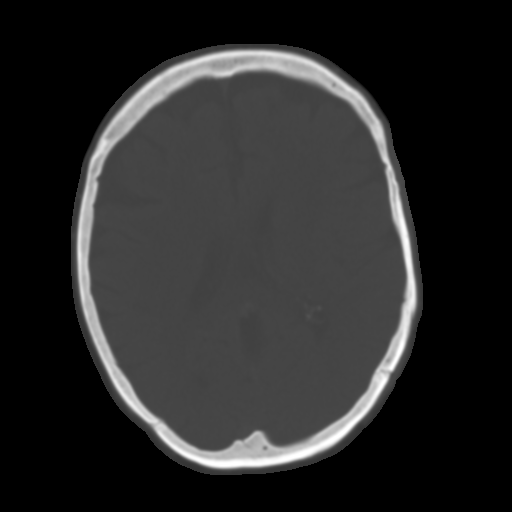
[im 23/33  brain]
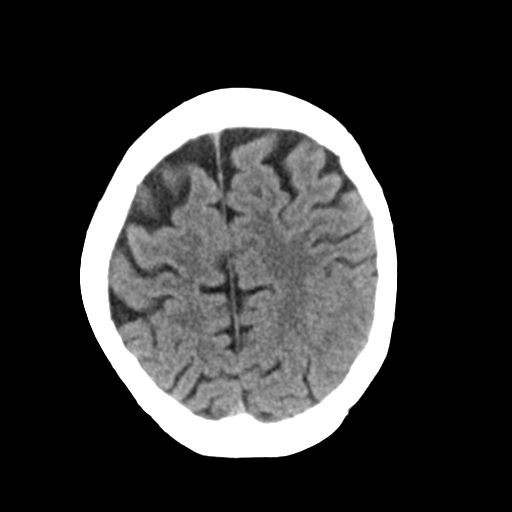
[im 26/33  brain]
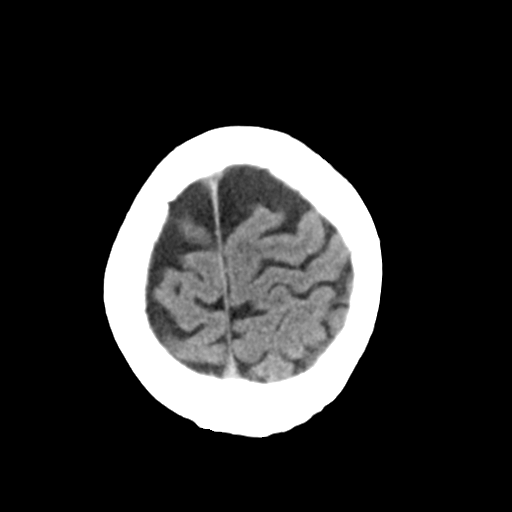
[im 30/33  brain]
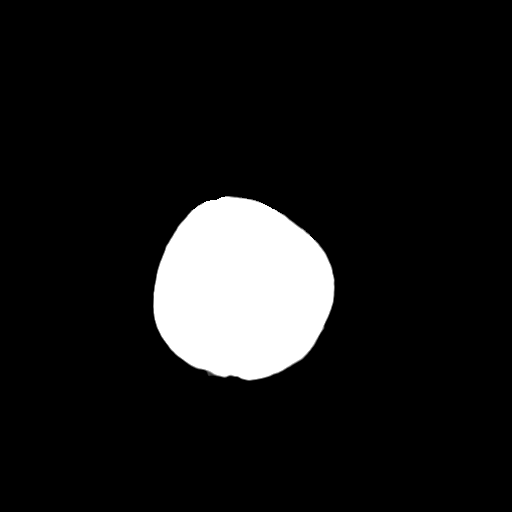

[Series 5: coronal soft tissue · coronal · 0.33mm/px · 3 of 67 slices shown]
[im 23/67  brain]
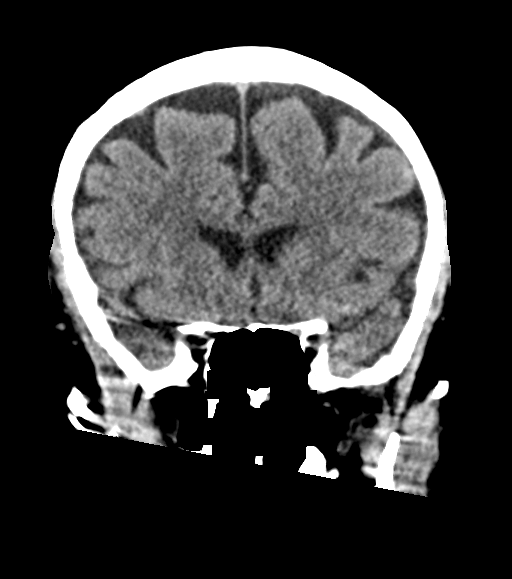
[im 30/67  brain]
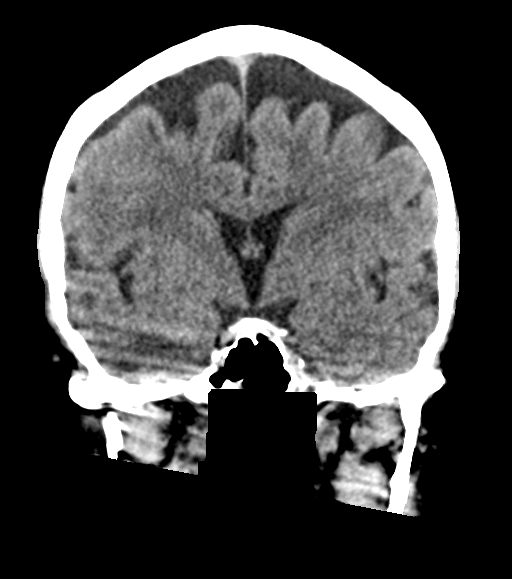
[im 37/67  brain]
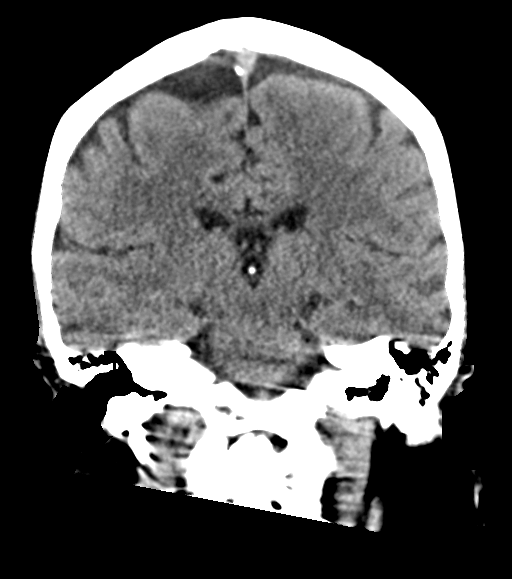

[Series 6: sagittal soft tissue · sagittal · 0.38mm/px · 3 of 56 slices shown]
[im 22/56  brain]
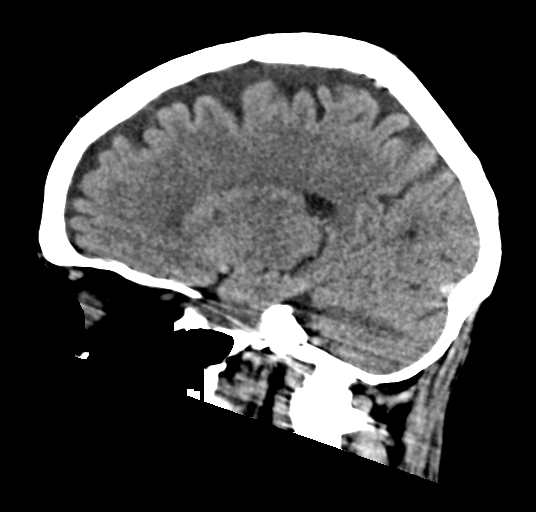
[im 28/56  brain]
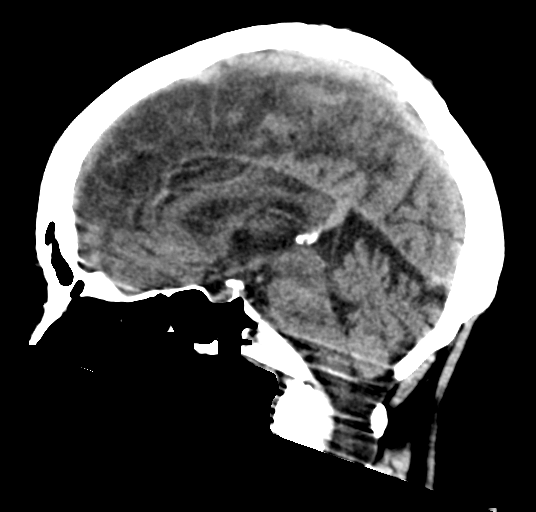
[im 34/56  brain]
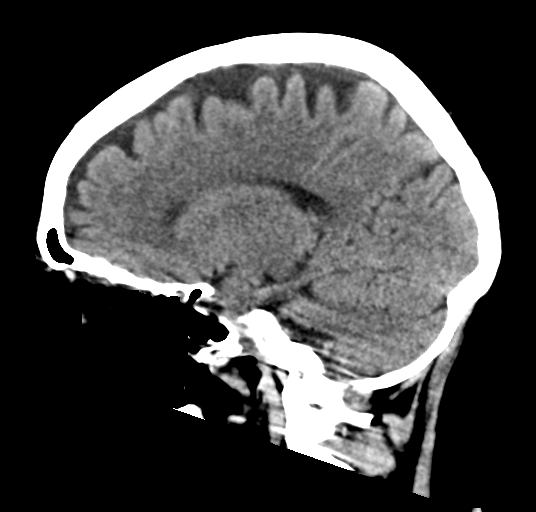

[14 of 47 positions shown; findings below may reference images not displayed]

FINDINGS: CT HEAD FINDINGS

Brain: There is ill-defined hypodensity in the left centrum
semiovale. The left basal ganglia and insular cortex are preserved.
The cortex throughout the left MCA distribution is preserved.

There is focal hypodensity in the right parietal lobe likely
reflecting remote infarct. Additional foci of hypodensity in the
bilateral cerebral hemispheres are nonspecific but may reflect
sequela of chronic white matter microangiopathy.

There is no evidence of acute intracranial hemorrhage or extra-axial
fluid collection. The ventricles are not enlarged. There is no mass
lesion. There is no midline shift.

Vascular: There is a dense MCA on the left. See below.

Skull: Normal. Negative for fracture or focal lesion.

Sinuses/orbits: The imaged paranasal sinuses are clear. The mastoid
air cells are clear. Bilateral lens implants are in place. The
globes and orbits are otherwise unremarkable.

Review of the MIP images confirms the above findings

CTA NECK FINDINGS

The CTA images are moderately degraded by motion artifact.

Aortic arch: Standard branching. Imaged portion shows no evidence of
aneurysm or dissection. No significant stenosis of the major arch
vessel origins.

Right carotid system: The right carotid system is suboptimally
evaluated particularly in the region of the carotid bulb due to
motion artifact. The right internal carotid artery has a medialized
course. Otherwise, there is no definite focal stenosis, occlusion,
dissection, or aneurysm.

Left carotid system: The left carotid system is suboptimally
evaluated due to motion artifact particularly in the region of the
carotid bulb. There is calcified atherosclerotic plaque of the left
carotid bulb without definite hemodynamically significant stenosis
or occlusion. There is no definite dissection or aneurysm.

Vertebral arteries: The vertebral arteries appear patent, without
definite focal stenosis, dissection, occlusion, or aneurysm.

Skeleton: There is mild multilevel degenerative change of the
cervical spine. There is no acute osseous abnormality or aggressive
osseous lesion.

Other neck: The soft tissues are unremarkable.

Upper chest: There is a 1.1 cm x 1.0 cm soft tissue nodule in the
medial right apex.

Review of the MIP images confirms the above findings

CTA HEAD FINDINGS

Anterior circulation: There is calcification of the bilateral
cavernous ICAs without focal stenosis or occlusion.

There is occlusion of the proximal left M1 segment with
reconstitution of flow at the M2 segments. There appears to be good
collateral flow throughout the peripheral MCA distribution.

The right MCA is patent.

The bilateral ACAs are patent. The right A1 segment is diminutive.
There is no aneurysm.

Posterior circulation: The posterior circulation is suboptimally
evaluated due to motion artifact. The V4 segments of the vertebral
arteries are patent. The basilar artery is patent. There is
multifocal moderate stenosis of the left P2 segment (9-256, 10-116,

The right PCA is patent.  There is no aneurysm.

Venous sinuses: As permitted by contrast timing, patent.

Anatomic variants: None.

Review of the MIP images confirms the above findings
IMPRESSION: 1. Occlusion of the left M1 segment with reconstitution of flow at
the M2 segments with good collateral flow throughout the remainder
of the MCA distribution.
2. No evidence of left MCA territory infarct at the time of imaging.
Aspects is 10.
3. The CTA images are moderately motion degraded. There is no
definite high-grade stenosis in the neck. There is multifocal
moderate stenosis of the left P2 segment as above.
4. Small remote infarct in the right parietal lobe. Other foci of
hypodensity in the supratentorial white matter likely reflects
sequela of chronic white matter microangiopathy.
5. 1.1 cm soft tissue nodule in the medial right apex. Consider one
of the following in 3 months for both low-risk and high-risk
individuals: (a) repeat chest CT, (b) follow-up PET-CT, or (c)
tissue sampling. This recommendation follows the consensus
statement: Guidelines for Management of Incidental Pulmonary Nodules
Detected on CT Images: From the [HOSPITAL] [G5]; Radiology
[G5]; [DATE].

The acute results were called by telephone at the time of
interpretation on [DATE] at [DATE] to provider Dr JANETT , who
verbally acknowledged these results.

## 2021-05-25 IMAGING — XA IR PERCUTANEOUS ART THORMBECTOMY/INFUSION INTRACRANIAL INCLUDE D
6 of 9 series · 12 of 24 positions shown · non-contrast
Comparison: CT/CTA [DATE].

INDICATION: SAGULIN is an 70 year-old female with a past medical history
significant for osteoarthritis and cataracts who presented
emergently to the ED at [HOSPITAL] due to right side weakness, with eyes
drifting to left, and unable to talk with decreased level of
consciousness. NIHSS at presentation was 19; baseline modified
SAGULIN 0. Head CT showed no evidence of a large acute infarct
or hemorrhage (ASPECTS 10). CTA showed a proximal left M1/MCA
occlusion with good collaterals. She receive IV SAGULIN and was then
transferred to our service for a diagnostic cerebral angiogram and
mechanical thrombectomy.

EXAM:
ULTRASOUND GUIDED VASCULAR [REDACTED] CEREBRAL ANGIOGRAM
MECHANICAL THROMBECTOMY
FLAT PANEL HEAD CT
TECHNIQUE: Informed written consent was obtained from the patient's husband
after a thorough discussion of the procedural risks, benefits and
alternatives. All questions were addressed.

[Series 1: ir (id) (id)/(id) · 1 of 2 slices shown]
[im 1/2]
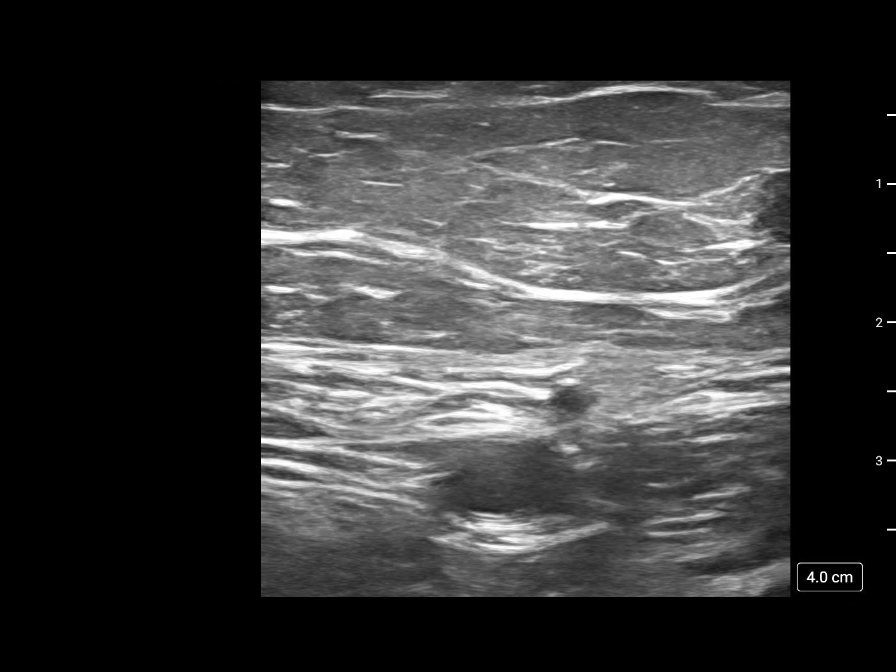

[Series 4: cerebral · 1 of 7 slices shown (1 of 2)]
[im 1/7]
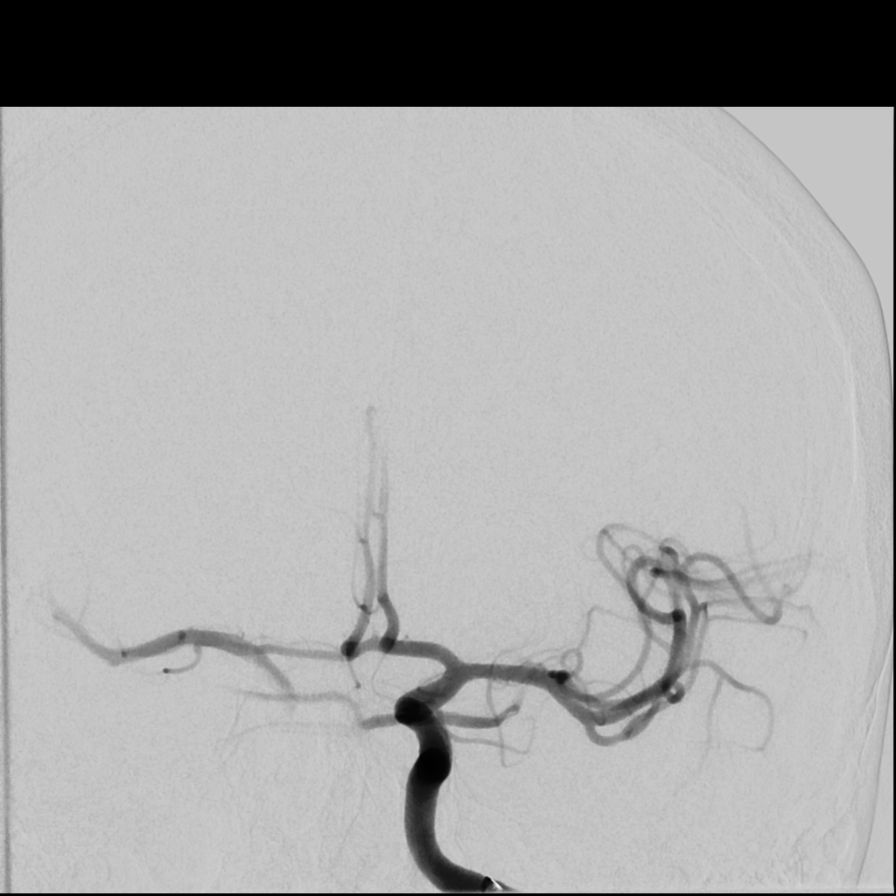

[Series 6: post coronal · axial · 5.0mm · 0.36mm/px · z∈[-168,-23]mm · 3 of 38 slices shown (1 of 2)]
[im 1/38  full-range]
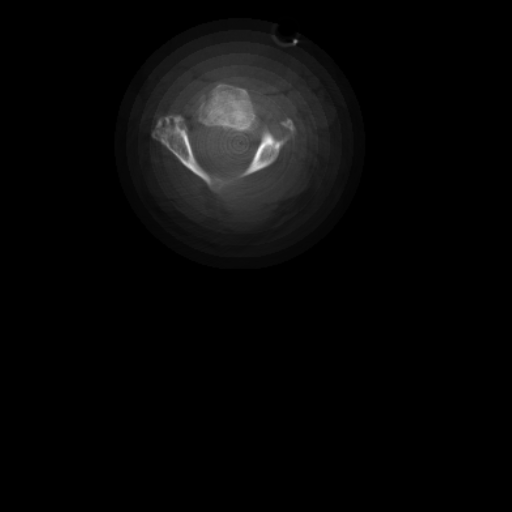
[im 15/38]
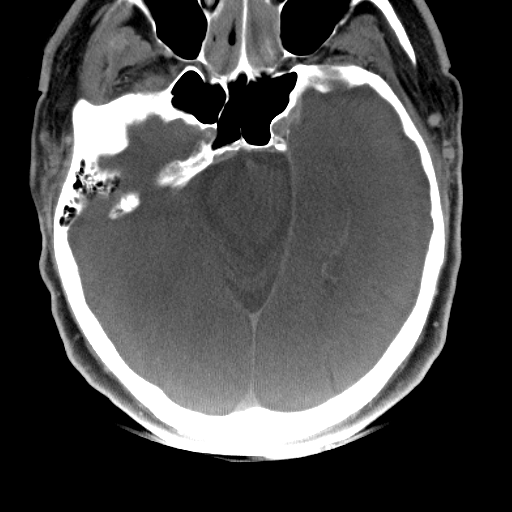
[im 30/38]
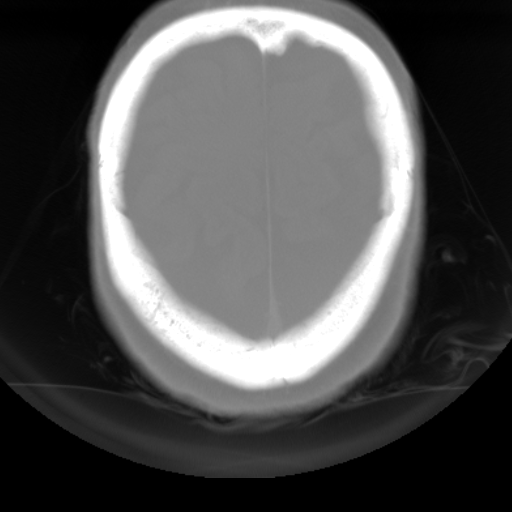

[Series 6: post coronal · axial · 5.0mm · 0.36mm/px · z∈[-168,+42]mm · 4 of 38 slices shown (2 of 2)]
[im 1/38  full-range]
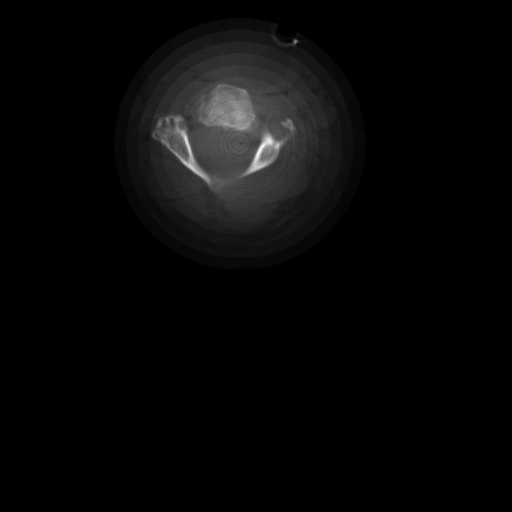
[im 13/38]
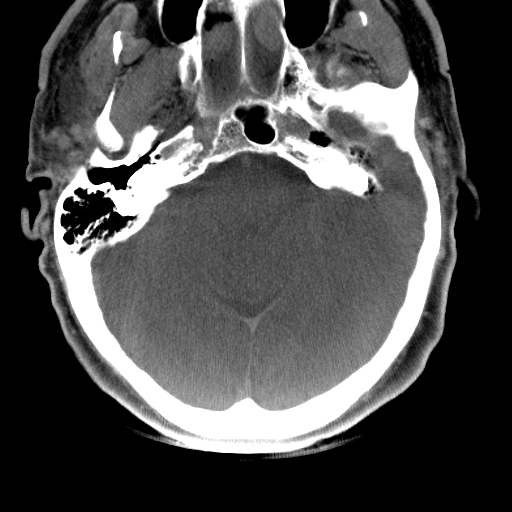
[im 25/38]
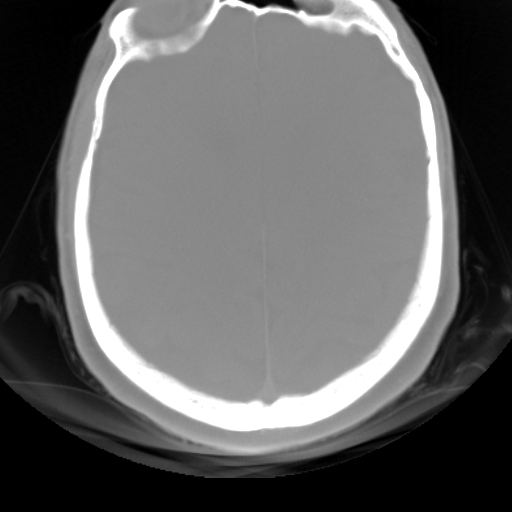
[im 38/38]
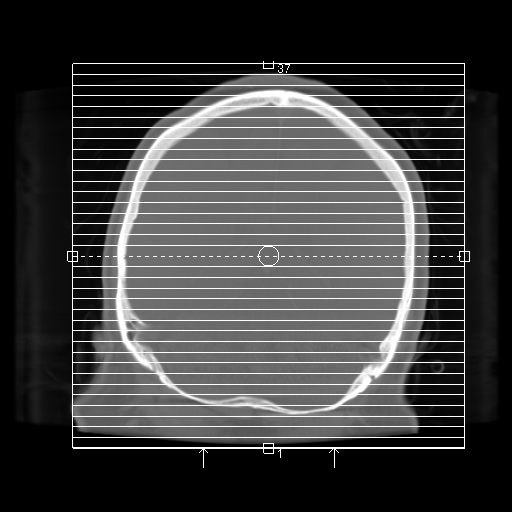

[Series 6: post sag · axial · 5.0mm · 0.42mm/px · z∈[-124,-34]mm · 2 of 37 slices shown]
[im 10/37]
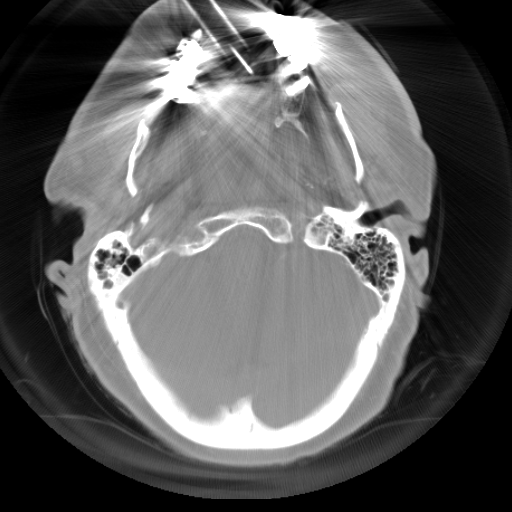
[im 28/37]
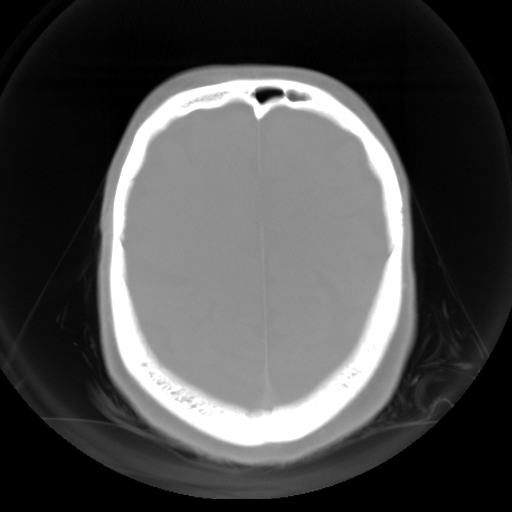

[Series 7: cerebral · 1 of 2 slices shown (2 of 2)]
[im 1/2]
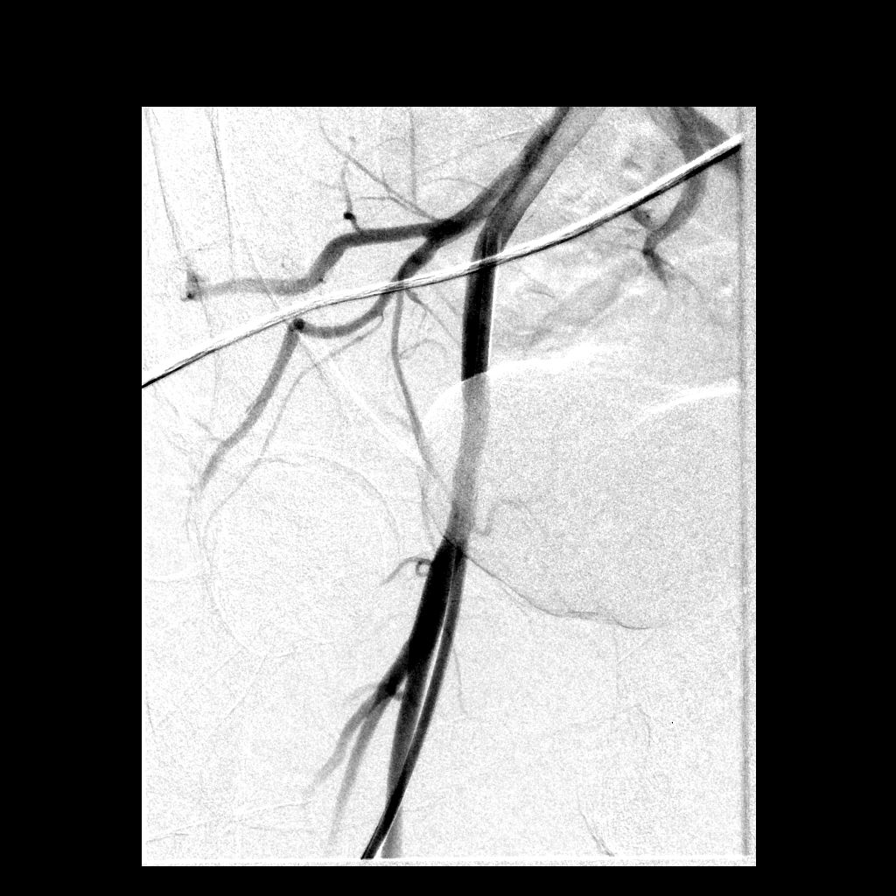

[12 of 24 positions shown; findings below may reference images not displayed]

MEDICATIONS:
No antibiotic administered.

ANESTHESIA/SEDATION:
The procedure was performed under general anesthesia.

CONTRAST:  25 mL of Omnipaque 240 mg/mL

FLUOROSCOPY TIME:  Fluoroscopy Time: 4 minutes 36 seconds (420 mGy).

COMPLICATIONS:
None immediate.
Maximal Sterile Barrier Technique was utilized including caps, mask,
sterile gowns, sterile gloves, sterile drape, hand hygiene and skin
antiseptic. A timeout was performed prior to the initiation of the
procedure.

The right groin was prepped and draped in the usual sterile fashion.
Using a micropuncture kit and the modified Seldinger technique,
access was gained to the right common femoral artery and an 8 French
sheath was placed. Real-time ultrasound guidance was utilized for
vascular access including the acquisition of a permanent ultrasound
image documenting patency of the accessed vessel.

Under fluoroscopy, a Zoom 88 guide catheter was navigated over a 6
SAGULIN 2 catheter and a 0.035" Terumo Glidewire into the
aortic arch. The catheter was placed into the left common carotid
artery and then advanced into the left internal carotid artery. The
SAGULIN 2 catheter was removed. Frontal and lateral angiograms of
the head were obtained.
FINDINGS: 1. Normal caliber of the right common femoral artery.
2. Occlusion of the left MCA at the proximal M1 segment.

PROCEDURE:
Under biplane roadmap, a zoom 71 aspiration catheter was navigated
over an Aristotle 24 microguidewire into the cavernous segment of
the left ICA. The aspiration catheter was then advanced to the level
of occlusion and connected to an aspiration pump. Continuous
aspiration was performed for 2 minutes. The guide catheter was
connected to a VacLok syringe and advanced into the M1 segment while
the aspiration catheter wasremoved under constant aspiration. The
Zoom 88 guide catheter was continuously aspirated until clear blood
return was obtained.

Left ICA angiograms with frontal and lateral views showed complete
left MCA vascular tree recanalization without evidence of
thromboembolic complication.

Flat panel CT of the head was obtained and post processed in a
separate workstation with concurrent attending physician
supervision. Selected images were sent to PACS. No evidence of
hemorrhagic complication.

A right common femoral artery angiogram was then obtained in right
anterior oblique view via sheath side port. The puncture is at the
level of the common femoral artery which has normal caliber and
contour, adequate for closure device. The femoral sheath was
exchanged over the wire for a Perclose ProStyle which was utilized
for access closure. Immediate hemostasis was achieved.
IMPRESSION: Successful and uncomplicated mechanical thrombectomy performed with
direct contact aspiration for treatment of a proximal left M1/MCA
occlusion with complete recanalization ([RS]).

PLAN:
1. Transfer to ICA for continued care.
2. SBP 120-140 mmHg for 24 hours post procedure.
3. Bed rest for 6 hours.

## 2021-05-25 IMAGING — CT CT ANGIO HEAD-NECK (W OR W/O PERF)
3 of 7 series · 10 of 35 positions shown · IV contrast (APPLIED)
Comparison: None.

CLINICAL DATA: Left-sided weakness

EXAM:
CT ANGIOGRAPHY HEAD AND NECK
TECHNIQUE: Multidetector CT imaging of the head and neck was performed using
the standard protocol during bolus administration of intravenous
contrast. Multiplanar CT image reconstructions and MIPs were
obtained to evaluate the vascular anatomy. Carotid stenosis
measurements (when applicable) are obtained utilizing NASCET
criteria, using the distal internal carotid diameter as the
denominator.
CONTRAST:  75mL OMNIPAQUE IOHEXOL 350 MG/ML SOLN

[Series 7: cta head neck · axial · 0.46mm/px · z∈[+926,+1030]mm · 2 of 158 slices shown]
[im 53/158  soft-tissue]
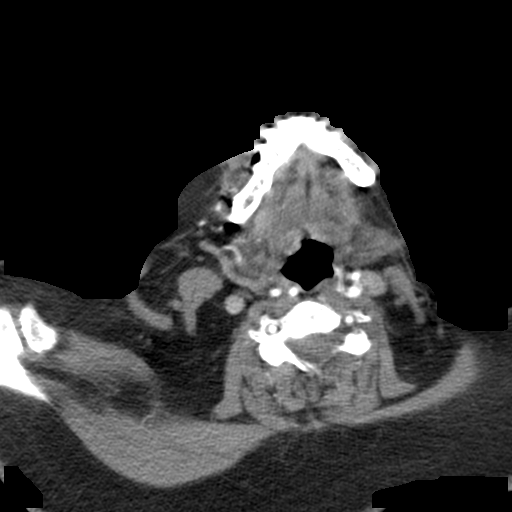
[im 105/158  soft-tissue]
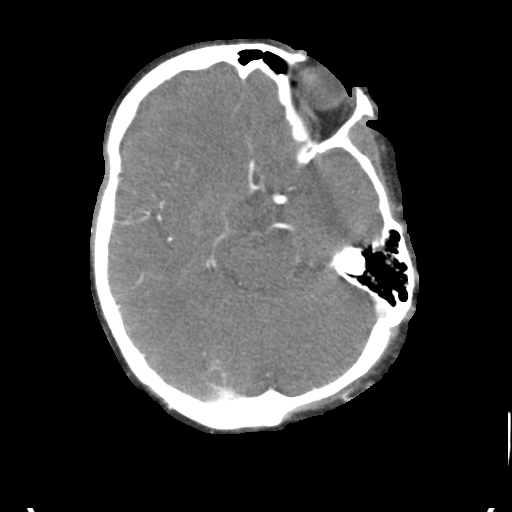

[Series 9: ax thin · axial · 0.40mm/px · z∈[+805,+1047]mm · 6 of 353 slices shown]
[im 51/353  soft-tissue]
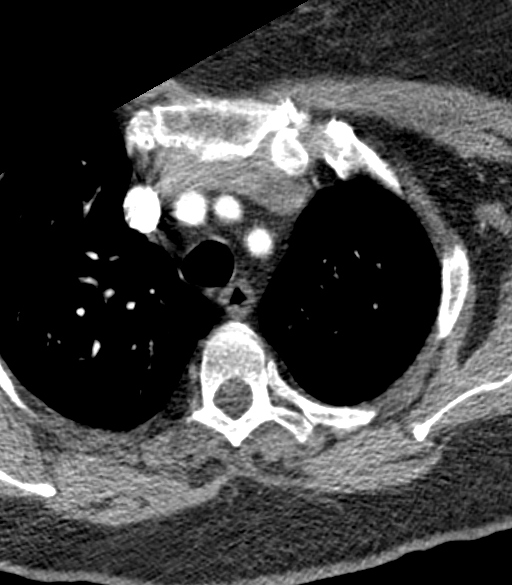
[im 101/353  bone]
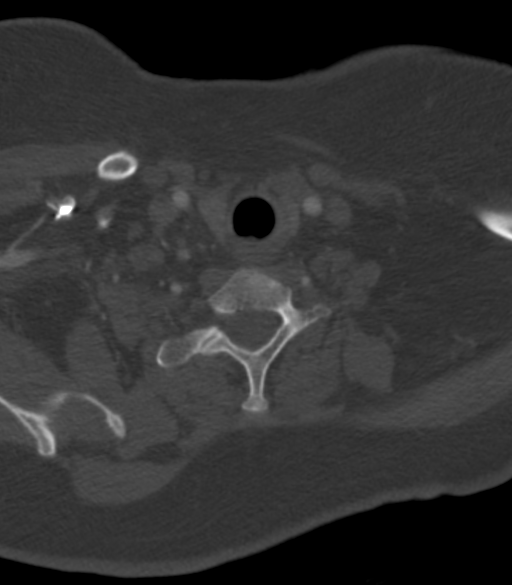
[im 151/353  soft-tissue]
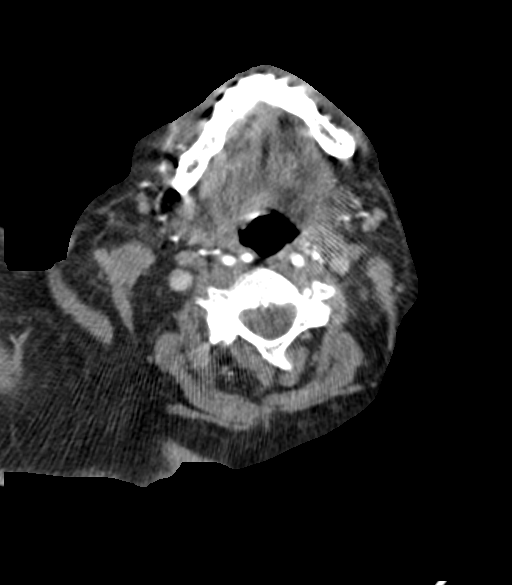
[im 202/353  bone]
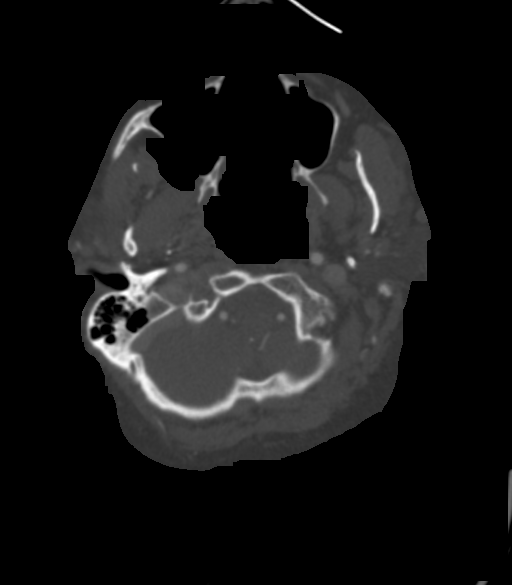
[im 252/353  soft-tissue]
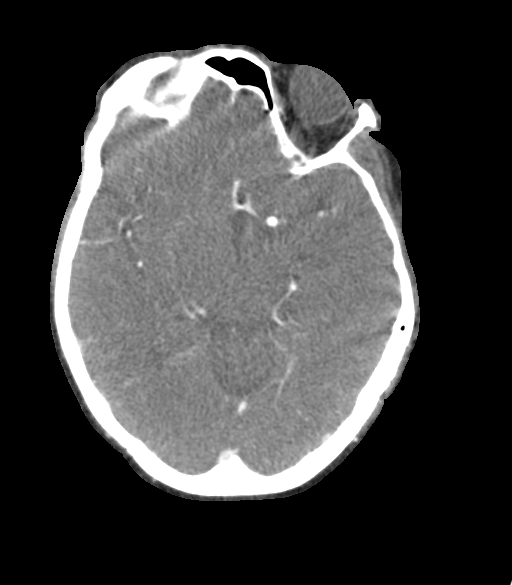
[im 302/353  bone]
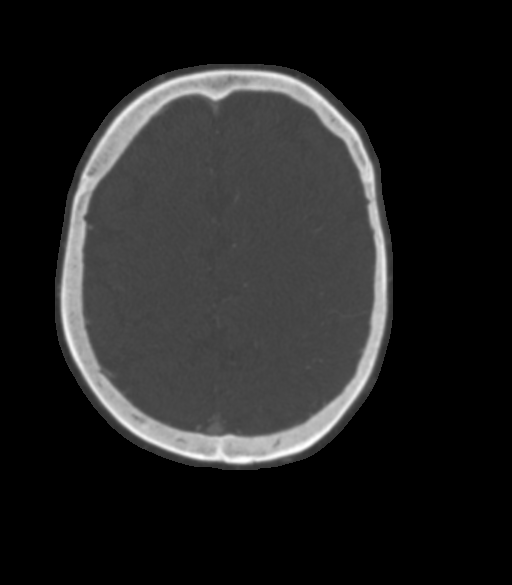

[Series 11: sagittal thin · sagittal · 0.48mm/px · 2 of 188 slices shown]
[im 57/188  soft-tissue]
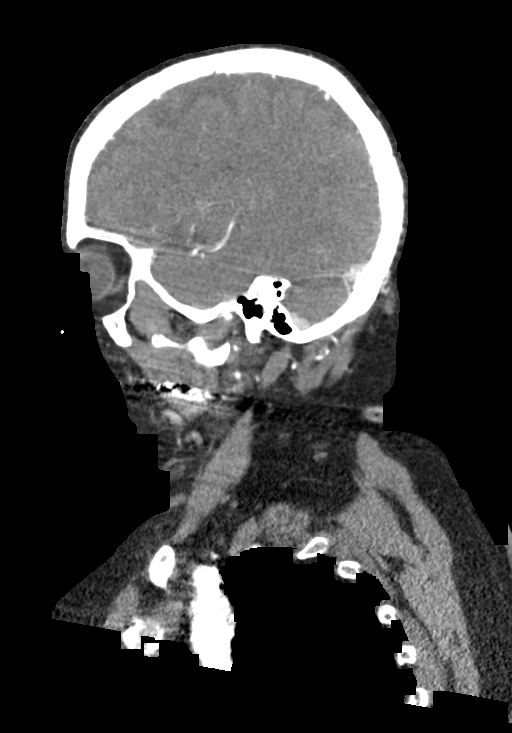
[im 132/188  soft-tissue]
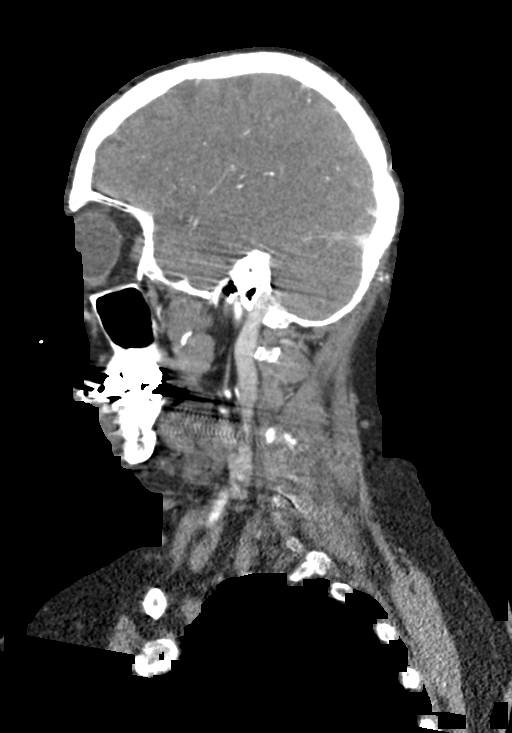

[10 of 35 positions shown; findings below may reference images not displayed]

FINDINGS: CT HEAD FINDINGS

Brain: There is ill-defined hypodensity in the left centrum
semiovale. The left basal ganglia and insular cortex are preserved.
The cortex throughout the left MCA distribution is preserved.

There is focal hypodensity in the right parietal lobe likely
reflecting remote infarct. Additional foci of hypodensity in the
bilateral cerebral hemispheres are nonspecific but may reflect
sequela of chronic white matter microangiopathy.

There is no evidence of acute intracranial hemorrhage or extra-axial
fluid collection. The ventricles are not enlarged. There is no mass
lesion. There is no midline shift.

Vascular: There is a dense MCA on the left. See below.

Skull: Normal. Negative for fracture or focal lesion.

Sinuses/orbits: The imaged paranasal sinuses are clear. The mastoid
air cells are clear. Bilateral lens implants are in place. The
globes and orbits are otherwise unremarkable.

Review of the MIP images confirms the above findings

CTA NECK FINDINGS

The CTA images are moderately degraded by motion artifact.

Aortic arch: Standard branching. Imaged portion shows no evidence of
aneurysm or dissection. No significant stenosis of the major arch
vessel origins.

Right carotid system: The right carotid system is suboptimally
evaluated particularly in the region of the carotid bulb due to
motion artifact. The right internal carotid artery has a medialized
course. Otherwise, there is no definite focal stenosis, occlusion,
dissection, or aneurysm.

Left carotid system: The left carotid system is suboptimally
evaluated due to motion artifact particularly in the region of the
carotid bulb. There is calcified atherosclerotic plaque of the left
carotid bulb without definite hemodynamically significant stenosis
or occlusion. There is no definite dissection or aneurysm.

Vertebral arteries: The vertebral arteries appear patent, without
definite focal stenosis, dissection, occlusion, or aneurysm.

Skeleton: There is mild multilevel degenerative change of the
cervical spine. There is no acute osseous abnormality or aggressive
osseous lesion.

Other neck: The soft tissues are unremarkable.

Upper chest: There is a 1.1 cm x 1.0 cm soft tissue nodule in the
medial right apex.

Review of the MIP images confirms the above findings

CTA HEAD FINDINGS

Anterior circulation: There is calcification of the bilateral
cavernous ICAs without focal stenosis or occlusion.

There is occlusion of the proximal left M1 segment with
reconstitution of flow at the M2 segments. There appears to be good
collateral flow throughout the peripheral MCA distribution.

The right MCA is patent.

The bilateral ACAs are patent. The right A1 segment is diminutive.
There is no aneurysm.

Posterior circulation: The posterior circulation is suboptimally
evaluated due to motion artifact. The V4 segments of the vertebral
arteries are patent. The basilar artery is patent. There is
multifocal moderate stenosis of the left P2 segment (9-256, 10-116,

The right PCA is patent.  There is no aneurysm.

Venous sinuses: As permitted by contrast timing, patent.

Anatomic variants: None.

Review of the MIP images confirms the above findings
IMPRESSION: 1. Occlusion of the left M1 segment with reconstitution of flow at
the M2 segments with good collateral flow throughout the remainder
of the MCA distribution.
2. No evidence of left MCA territory infarct at the time of imaging.
Aspects is 10.
3. The CTA images are moderately motion degraded. There is no
definite high-grade stenosis in the neck. There is multifocal
moderate stenosis of the left P2 segment as above.
4. Small remote infarct in the right parietal lobe. Other foci of
hypodensity in the supratentorial white matter likely reflects
sequela of chronic white matter microangiopathy.
5. 1.1 cm soft tissue nodule in the medial right apex. Consider one
of the following in 3 months for both low-risk and high-risk
individuals: (a) repeat chest CT, (b) follow-up PET-CT, or (c)
tissue sampling. This recommendation follows the consensus
statement: Guidelines for Management of Incidental Pulmonary Nodules
Detected on CT Images: From the [HOSPITAL] [G5]; Radiology
[G5]; [DATE].

The acute results were called by telephone at the time of
interpretation on [DATE] at [DATE] to provider Dr JANETT , who
verbally acknowledged these results.

## 2021-05-25 IMAGING — DX DG CHEST 1V PORT
1 series · 1 of 1 positions shown · non-contrast
Comparison: None.

CLINICAL DATA: Shortness of breath

EXAM:
PORTABLE CHEST 1 VIEW

[chest ap]
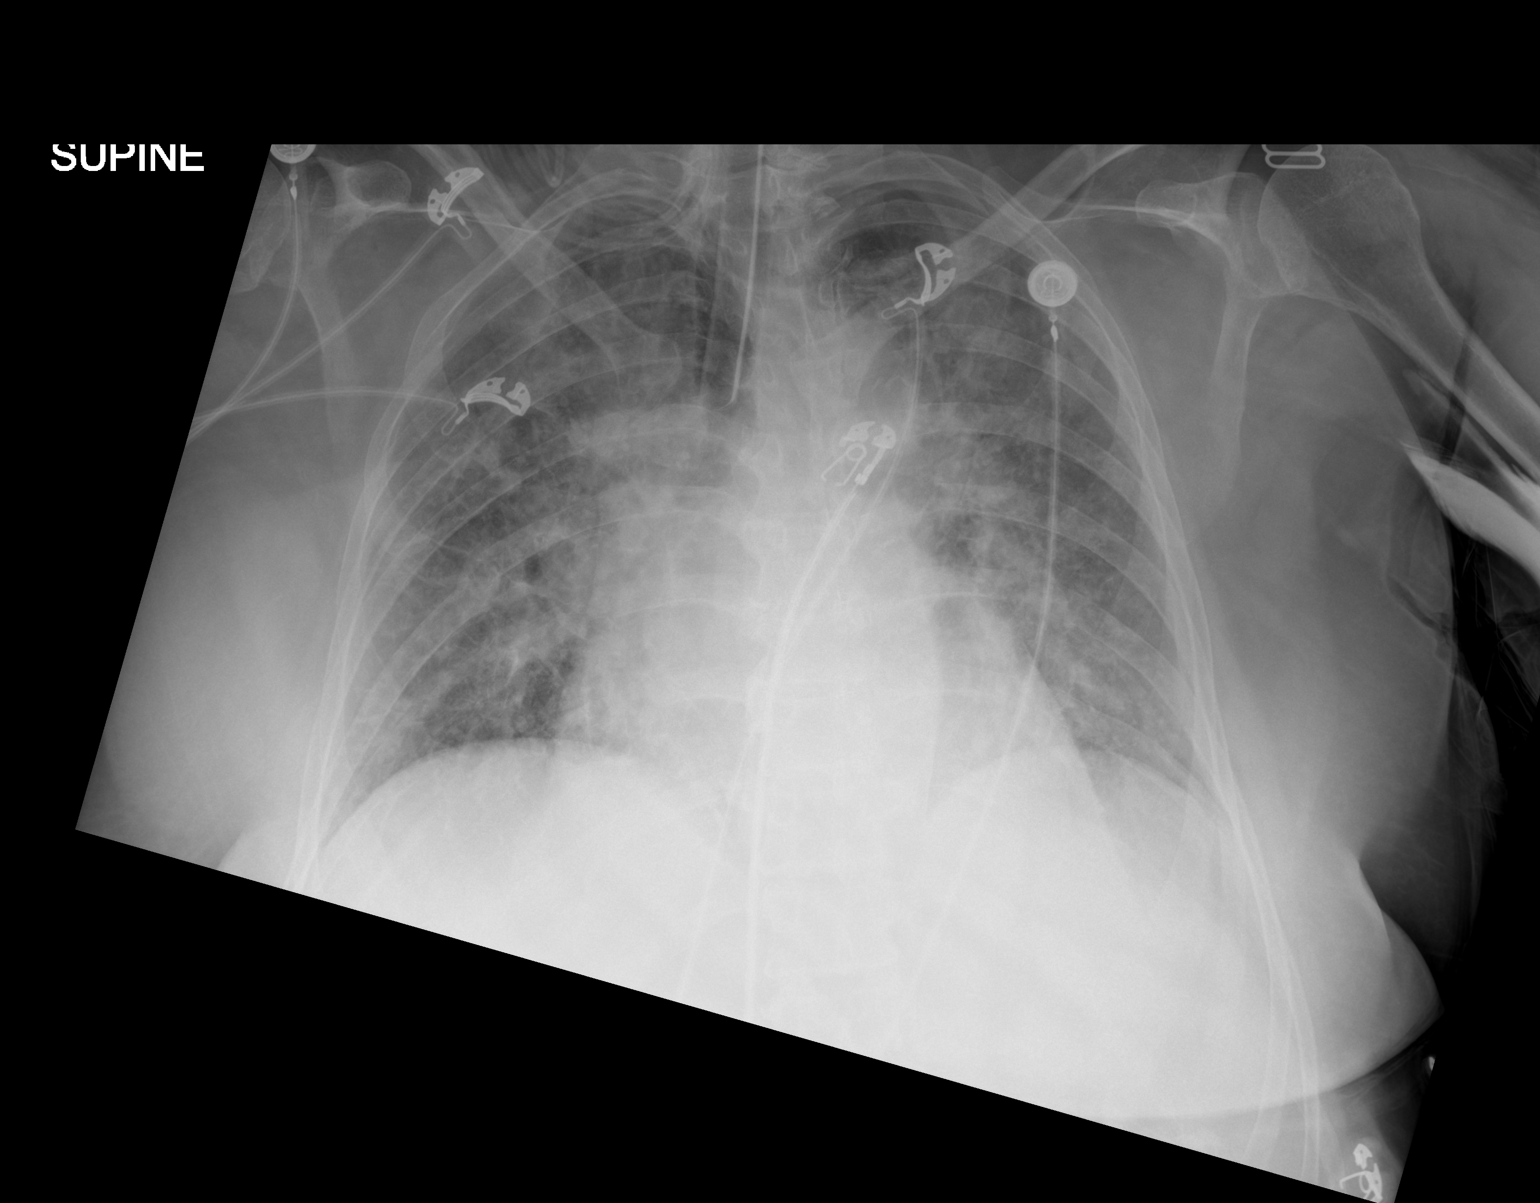

[1 of 1 positions shown; findings below may reference images not displayed]

FINDINGS: Endotracheal tube terminates proximally 1.5 cm above the carina.
Heart size appears mildly enlarged. Diffuse bilateral interstitial
prominence. No pleural effusion or pneumothorax. Age indeterminate
fractures of the posterior right seventh and likely eighth ribs.
IMPRESSION: 1. Endotracheal tube terminates 1.5 cm above the carina.
2. Diffuse bilateral interstitial prominence may reflect
atelectasis, edema, versus atypical/viral infection.
3. Age indeterminate fractures of the posterior right seventh and
likely eighth ribs.

## 2021-05-25 SURGERY — IR WITH ANESTHESIA
Anesthesia: General

## 2021-05-25 MED ORDER — CANGRELOR TETRASODIUM 50 MG IV SOLR
INTRAVENOUS | Status: AC
  Filled 2021-05-25: qty 50

## 2021-05-25 MED ORDER — ETOMIDATE 2 MG/ML IV SOLN: 20.0000 mg | Freq: Once | INTRAVENOUS | Status: AC

## 2021-05-25 MED ORDER — CHLORHEXIDINE GLUCONATE 0.12 % MT SOLN
15.0000 mL | Freq: Two times a day (BID) | OROMUCOSAL | Status: DC
  Administered 2021-05-25 – 2021-05-26 (×2): 15 mL via OROMUCOSAL
  Filled 2021-05-25: qty 15

## 2021-05-25 MED ORDER — PHENYLEPHRINE 40 MCG/ML (10ML) SYRINGE FOR IV PUSH (FOR BLOOD PRESSURE SUPPORT)
PREFILLED_SYRINGE | INTRAVENOUS | Status: DC | PRN
  Administered 2021-05-25 (×2): 120 ug via INTRAVENOUS
  Administered 2021-05-25: 160 ug via INTRAVENOUS

## 2021-05-25 MED ORDER — FENTANYL BOLUS VIA INFUSION
50.0000 ug | INTRAVENOUS | Status: DC | PRN
  Filled 2021-05-25: qty 50

## 2021-05-25 MED ORDER — ROCURONIUM BROMIDE 10 MG/ML (PF) SYRINGE
PREFILLED_SYRINGE | INTRAVENOUS | Status: AC
  Administered 2021-05-25: 100 mg via INTRAVENOUS
  Filled 2021-05-25: qty 10

## 2021-05-25 MED ORDER — PHENYLEPHRINE HCL-NACL 20-0.9 MG/250ML-% IV SOLN: 25.0000 ug/min | INTRAVENOUS | Status: DC

## 2021-05-25 MED ORDER — MIDAZOLAM HCL 2 MG/2ML IJ SOLN
2.0000 mg | Freq: Once | INTRAMUSCULAR | Status: AC
  Administered 2021-05-25: 2 mg via INTRAVENOUS

## 2021-05-25 MED ORDER — STROKE: EARLY STAGES OF RECOVERY BOOK: Freq: Once | Status: DC

## 2021-05-25 MED ORDER — TENECTEPLASE FOR STROKE
0.2500 mg/kg | PACK | Freq: Once | INTRAVENOUS | Status: AC
  Administered 2021-05-25: 24 mg via INTRAVENOUS

## 2021-05-25 MED ORDER — IOHEXOL 240 MG/ML SOLN
50.0000 mL | Freq: Once | INTRAMUSCULAR | Status: AC | PRN
  Administered 2021-05-25: 12 mL via INTRA_ARTERIAL

## 2021-05-25 MED ORDER — LABETALOL HCL 5 MG/ML IV SOLN
10.0000 mg | Freq: Once | INTRAVENOUS | Status: AC
  Administered 2021-05-25: 10 mg via INTRAVENOUS

## 2021-05-25 MED ORDER — CLEVIDIPINE BUTYRATE 0.5 MG/ML IV EMUL
0.0000 mg/h | INTRAVENOUS | Status: DC
  Administered 2021-05-25: 1 mg/h via INTRAVENOUS

## 2021-05-25 MED ORDER — PROPOFOL 500 MG/50ML IV EMUL
INTRAVENOUS | Status: DC | PRN
  Administered 2021-05-25: 50 ug/kg/min via INTRAVENOUS

## 2021-05-25 MED ORDER — IOHEXOL 350 MG/ML SOLN
75.0000 mL | Freq: Once | INTRAVENOUS | Status: AC | PRN
  Administered 2021-05-25: 75 mL via INTRAVENOUS

## 2021-05-25 MED ORDER — PROPOFOL 1000 MG/100ML IV EMUL: 5.0000 ug/kg/min | INTRAVENOUS | Status: DC

## 2021-05-25 MED ORDER — FENTANYL 2500MCG IN NS 250ML (10MCG/ML) PREMIX INFUSION
0.0000 ug/h | INTRAVENOUS | Status: DC
  Administered 2021-05-25: 25 ug/h via INTRAVENOUS
  Filled 2021-05-25: qty 250

## 2021-05-25 MED ORDER — ACETAMINOPHEN 160 MG/5ML PO SOLN: 650.0000 mg | ORAL | Status: DC | PRN

## 2021-05-25 MED ORDER — PROPOFOL 1000 MG/100ML IV EMUL
INTRAVENOUS | Status: AC
  Administered 2021-05-25: 10 ug/kg/min via INTRAVENOUS
  Filled 2021-05-25: qty 100

## 2021-05-25 MED ORDER — ROCURONIUM BROMIDE 10 MG/ML (PF) SYRINGE: 100.0000 mg | PREFILLED_SYRINGE | Freq: Once | INTRAVENOUS | Status: AC

## 2021-05-25 MED ORDER — CLEVIDIPINE BUTYRATE 0.5 MG/ML IV EMUL
0.0000 mg/h | INTRAVENOUS | Status: DC
Start: 2021-05-25 — End: 2021-05-25

## 2021-05-25 MED ORDER — ETOMIDATE 2 MG/ML IV SOLN
INTRAVENOUS | Status: AC
  Administered 2021-05-25: 20 mg via INTRAVENOUS
  Filled 2021-05-25: qty 10

## 2021-05-25 MED ORDER — FAMOTIDINE 20 MG PO TABS: 20.0000 mg | ORAL_TABLET | Freq: Every day | ORAL | Status: DC

## 2021-05-25 MED ORDER — ROCURONIUM BROMIDE 10 MG/ML (PF) SYRINGE
PREFILLED_SYRINGE | INTRAVENOUS | Status: DC | PRN
  Administered 2021-05-25: 70 mg via INTRAVENOUS

## 2021-05-25 MED ORDER — SODIUM CHLORIDE 0.9% FLUSH: 3.0000 mL | Freq: Once | INTRAVENOUS | Status: DC

## 2021-05-25 MED ORDER — VERAPAMIL HCL 2.5 MG/ML IV SOLN
INTRAVENOUS | Status: AC
  Filled 2021-05-25: qty 4

## 2021-05-25 MED ORDER — ETOMIDATE 2 MG/ML IV SOLN
INTRAVENOUS | Status: AC
  Filled 2021-05-25: qty 10

## 2021-05-25 MED ORDER — SENNOSIDES-DOCUSATE SODIUM 8.6-50 MG PO TABS: 1.0000 | ORAL_TABLET | Freq: Every evening | ORAL | Status: DC | PRN

## 2021-05-25 MED ORDER — ACETAMINOPHEN 650 MG RE SUPP: 650.0000 mg | RECTAL | Status: DC | PRN

## 2021-05-25 MED ORDER — INSULIN ASPART 100 UNIT/ML IJ SOLN
0.0000 [IU] | INTRAMUSCULAR | Status: DC
  Administered 2021-05-25 – 2021-05-26 (×2): 2 [IU] via SUBCUTANEOUS
  Administered 2021-05-26 – 2021-05-28 (×3): 3 [IU] via SUBCUTANEOUS
  Administered 2021-05-29: 5 [IU] via SUBCUTANEOUS
  Administered 2021-05-29: 2 [IU] via SUBCUTANEOUS
  Administered 2021-05-29 – 2021-05-30 (×2): 3 [IU] via SUBCUTANEOUS
  Administered 2021-05-30 (×2): 2 [IU] via SUBCUTANEOUS
  Administered 2021-05-31: 3 [IU] via SUBCUTANEOUS
  Administered 2021-05-31 – 2021-06-01 (×2): 2 [IU] via SUBCUTANEOUS

## 2021-05-25 MED ORDER — LACTATED RINGERS IV SOLN
INTRAVENOUS | Status: DC | PRN
Start: 2021-05-25 — End: 2021-05-25

## 2021-05-25 MED ORDER — SODIUM CHLORIDE 0.9 % IV SOLN: INTRAVENOUS | Status: DC

## 2021-05-25 MED ORDER — SODIUM CHLORIDE 0.9 % IV SOLN: 250.0000 mL | INTRAVENOUS | Status: DC

## 2021-05-25 MED ORDER — IOHEXOL 240 MG/ML SOLN
50.0000 mL | Freq: Once | INTRAMUSCULAR | Status: AC | PRN
  Administered 2021-05-25: 13 mL via INTRA_ARTERIAL

## 2021-05-25 MED ORDER — MIDAZOLAM HCL 2 MG/2ML IJ SOLN
INTRAMUSCULAR | Status: AC
  Filled 2021-05-25: qty 2

## 2021-05-25 MED ORDER — FAMOTIDINE 20 MG IN NS 100 ML IVPB
20.0000 mg | INTRAVENOUS | Status: DC
  Administered 2021-05-25: 20 mg via INTRAVENOUS
  Filled 2021-05-25: qty 100

## 2021-05-25 MED ORDER — ACETAMINOPHEN 325 MG PO TABS: 650.0000 mg | ORAL_TABLET | ORAL | Status: DC | PRN

## 2021-05-25 MED ORDER — PHENYLEPHRINE HCL-NACL 20-0.9 MG/250ML-% IV SOLN
INTRAVENOUS | Status: DC | PRN
  Administered 2021-05-25: 60 ug/min via INTRAVENOUS

## 2021-05-25 MED ORDER — PROPOFOL 1000 MG/100ML IV EMUL
0.0000 ug/kg/min | INTRAVENOUS | Status: DC
  Administered 2021-05-25: 5 ug/kg/min via INTRAVENOUS
  Filled 2021-05-25: qty 100

## 2021-05-25 MED ORDER — PANTOPRAZOLE SODIUM 40 MG IV SOLR: 40.0000 mg | Freq: Every day | INTRAVENOUS | Status: DC

## 2021-05-25 NOTE — ED Notes (Signed)
[facility] called for emergent IR for pt spoke with Marcello Moores.  Sending truck  now

## 2021-05-25 NOTE — ED Provider Notes (Signed)
[facility] Emergency Department Provider Note   ____________________________________________   Event Date/Time   First MD Initiated Contact with Patient 05/25/21 1444     (approximate)  I have reviewed the triage vital signs and the nursing notes.   HISTORY  Chief Complaint Code Stroke    HPI Deborah Carey is a 70 y.o. female with past medical history of hyperlipidemia and anxiety who presents to the ED for altered mental status.  History is limited as patient is nonverbal at the time of my assessment.  Per husband, she brought a friend to the cancer center for treatment and was subsequently sitting in the waiting area when she was found slumped over.  CODE BLUE was called overhead and when ED team responded, patient found to be unable to speak with difficulty moving her right side.  She was immediately brought to the ED and code stroke was called.  Patient was last seen well around 2 PM when she was visualized by staff in the cancer center.        Past Medical History:  Diagnosis Date   Allergy    allergic rhinitis   Anxiety    Cataract    removed both eyes    Infertility, female    Osteoarthritis of both knees    OA    PONV (postoperative nausea and vomiting)    Post-menopausal bleeding    neg endo bx.    Patient Active Problem List   Diagnosis Date Noted   Mass of breast, left 11/13/2020   Colon cancer screening 05/14/2019   Vitamin D deficiency 05/08/2018   Flushing 06/20/2017   H/O fracture of fibula 01/21/2014   Osteopenia 01/07/2014   Family history of osteoporosis 11/29/2013   Estrogen deficiency 11/29/2013   Hyperlipidemia 11/29/2013   Encounter for Medicare annual wellness exam 10/29/2013   Irregular heartbeat 10/07/2013   Left thyroid nodule 09/26/2013   Enlarged thyroid 09/16/2013   Primary osteoarthritis of both knees 11/05/2012   Other screening mammogram 09/27/2011   Routine general medical examination at a health care  facility 09/19/2011   Obesity (BMI 35.0-39.9 without comorbidity) 06/06/2008   Prediabetes 06/06/2008   Anxiety state 04/17/2007   ALLERGIC RHINITIS 04/17/2007    Past Surgical History:  Procedure Laterality Date   cataract surgery  2011   CESAREAN SECTION     times 2   COLONOSCOPY  2010   hems    ENDOMETRIAL BIOPSY     neg for CA cells   FACIAL COSMETIC SURGERY  2006   face lift   FOOT SURGERY  1998   rt heel spur removal   REFRACTIVE SURGERY  08/1999    Prior to Admission medications   Medication Sig Start Date End Date Taking? Authorizing Provider  albuterol (PROVENTIL HFA;VENTOLIN HFA) 108 (90 Base) MCG/ACT inhaler Inhale 1-2 puffs into the lungs every 6 (six) hours as needed for wheezing (or for cough). 02/23/17   Tonia Ghent, MD  CALCIUM PO Take by mouth as needed.     [provider]  cetirizine (ZYRTEC) 10 MG tablet Take 10 mg by mouth daily. Taking as needed    [provider]  Cholecalciferol (VITAMIN D3) 125 MCG (5000 UT) CAPS Take by mouth.    [provider]  fluticasone (FLONASE) 50 MCG/ACT nasal spray Place 2 sprays into both nostrils as needed. 02/23/17   Tonia Ghent, MD  Multiple Vitamins-Minerals (MULTIVITAMIN PO) Take by mouth as needed.  [provider]  naproxen sodium (ANAPROX) 220 MG tablet Take 220 mg by mouth as needed.    [provider]  Omega-3 Fatty Acids (FISH OIL PO) Take by mouth as needed.     [provider]    Allergies Patient has no known allergies.  Family History  Problem Relation Age of Onset   COPD Brother    Stroke Mother    Hypertension Mother    Osteoporosis Mother    Alzheimer's disease Father    Hypertension Father    Osteoporosis Father    Depression Sister    Hypertension Sister    Osteoporosis Sister    Thyroid disease Sister    Hypothyroidism Sister    Parkinson's disease Sister    Osteoporosis Sister    Hypothyroidism Sister    Breast cancer Neg Hx     Colon polyps Neg Hx    Colon cancer Neg Hx    Esophageal cancer Neg Hx    Rectal cancer Neg Hx    Stomach cancer Neg Hx     Social History Social History   Tobacco Use   Smoking status: Never   Smokeless tobacco: Never  Vaping Use   Vaping Use: Never used  Substance Use Topics   Alcohol use: No    Alcohol/week: 0.0 standard drinks   Drug use: No    Review of Systems Unable to obtain secondary to altered mental status  ____________________________________________   PHYSICAL EXAM:  VITAL SIGNS: ED Triage Vitals  Enc Vitals Group     BP      Pulse      Resp      Temp      Temp src      SpO2      Weight      Height      Head Circumference      Peak Flow      Pain Score      Pain Loc      Pain Edu?      Excl. in [place]?     Constitutional: Awake and alert, no verbal response noted. Eyes: Conjunctivae are normal.  Pupils equal, round, and reactive to light bilaterally. Head: Atraumatic. Nose: No congestion/rhinnorhea. Mouth/Throat: Mucous membranes are moist. Neck: Normal ROM Cardiovascular: Tachycardic, irregularly irregular rhythm. Grossly normal heart sounds.  2+ radial pulses bilaterally. Respiratory: Tachypneic with increased respiratory effort.  No retractions. Lungs CTAB. Gastrointestinal: Soft and nontender. No distention. Genitourinary: deferred Musculoskeletal: No lower extremity tenderness nor edema. Neurologic: Nonverbal.  Right facial droop noted with no spontaneous movements in right upper extremity.  Patient withdraws from pain in right lower extremity but does not withdraw in right upper extremity.  Purposeful movements noted in left upper and lower extremities.  Patient unable to follow commands. Skin:  Skin is warm, dry and intact. No rash noted. Psychiatric: Unable to assess.  ____________________________________________   LABS (all labs ordered are listed, but only abnormal results are displayed)  Labs Reviewed  APTT - Abnormal;  Notable for the following components:      Result Value   aPTT 22 (*)    All other components within normal limits  CBC - Abnormal; Notable for the following components:   MCHC 36.7 (*)    All other components within normal limits  COMPREHENSIVE METABOLIC PANEL - Abnormal; Notable for the following components:   Glucose, Bld 128 (*)    All other components within normal limits  BLOOD GAS, VENOUS - Abnormal; Notable  for the following components:   pCO2, Ven 40 (*)    pO2, Ven 54.0 (*)    All other components within normal limits  RESP PANEL BY RT-PCR (FLU A&B, COVID) ARPGX2  PROTIME-INR  DIFFERENTIAL  CBG MONITORING, ED  TROPONIN I (HIGH SENSITIVITY)  TROPONIN I (HIGH SENSITIVITY)   ____________________________________________  EKG  ED ECG REPORT I, Blake Divine, the attending physician, personally viewed and interpreted this ECG.   Date: 05/25/2021  EKG Time: 15:12  Rate: 162  Rhythm: atrial fibrillation  Axis: Normal  Intervals:none  ST&T Change: None   PROCEDURES  Procedure(s) performed (including Critical Care):  .Critical Care Performed by: Blake Divine, MD Authorized by: Blake Divine, MD   Critical care provider statement:    Critical care time (minutes):  45   Critical care time was exclusive of:  Separately billable procedures and treating other patients and teaching time   Critical care was necessary to treat or prevent imminent or life-threatening deterioration of the following conditions:  CNS failure or compromise and respiratory failure   Critical care was time spent personally by me on the following activities:  Discussions with consultants, evaluation of patient's response to treatment, examination of patient, ordering and performing treatments and interventions, ordering and review of laboratory studies, ordering and review of radiographic studies, pulse oximetry, re-evaluation of patient's condition, obtaining history from patient or surrogate  and review of old charts   I assumed direction of critical care for this patient from another provider in my specialty: no     Care discussed with: accepting provider at another facility   Procedure Name: Intubation Date/Time: 05/25/2021 4:24 PM Performed by: Blake Divine, MD Pre-anesthesia Checklist: Patient identified, Patient being monitored, Emergency Drugs available, Timeout performed and Suction available Oxygen Delivery Method: Non-rebreather mask Preoxygenation: Pre-oxygenation with 100% oxygen Induction Type: Rapid sequence Ventilation: Mask ventilation without difficulty Laryngoscope Size: Mac and 4 Grade View: Grade I Tube size: 8.0 mm Number of attempts: 1 Airway Equipment and Method: Video-laryngoscopy Placement Confirmation: ETT inserted through vocal cords under direct vision, CO2 detector and Breath sounds checked- equal and bilateral Secured at: 23 cm Tube secured with: ETT holder Dental Injury: Teeth and Oropharynx as per pre-operative assessment       ____________________________________________   INITIAL IMPRESSION / ASSESSMENT AND PLAN / ED COURSE      71 year old female with past medical history of hyperlipidemia and anxiety presents to the ED for altered mental status after she was found slumped over in the cancer center waiting room.  I assessed patient after CODE BLUE was called overhead and she has obvious right-sided deficits with aphasia.  She was immediately brought to the ED for further evaluation and code stroke was called.  Blood glucose within normal limits.  Patient brought to CT which was reviewed by me with no obvious hemorrhage, CTA is positive for M1 occlusion.  Patient evaluated by neurology who recommended proceeding with tPA and arrangements made for transfer to [facility] for endovascular intervention.  Unfortunately, patient's respiratory status rapidly declined with increasing work of breathing and crackles concerning for pulmonary edema.   Decision was made to proceed with intubation as patient became hypoxic and had difficulty protecting her airway.  Chest x-ray reviewed by me and shows appropriate positioning of ET tube with associated pulmonary edema.  Patient transferred to [facility] via [facility] for endovascular intervention.      ____________________________________________   FINAL CLINICAL IMPRESSION(S) / ED DIAGNOSES  Final diagnoses:  Cerebrovascular accident (CVA),  unspecified mechanism ([place])  Acute respiratory failure with hypoxia [facility])     ED Discharge Orders     None        Note:  This document was prepared using Dragon voice recognition software and may include unintentional dictation errors.    Blake Divine, MD 05/25/21 [phone]

## 2021-05-25 NOTE — ED Notes (Signed)
Preparing for intubation.  RT at bedside

## 2021-05-25 NOTE — Procedures (Signed)
INTERVENTIONAL NEURORADIOLOGY BRIEF POSTPROCEDURE NOTE  DIAGNOSTIC CEREBRAL ANGIOGRAM AND MECHANICAL THROMBECTOMY  Attending: Dr. Erven Colla de Sindy Messing  Assistant: None.  Diagnosis: Left M1/MCA occlusion.  Access site: RCFA, 37F  Access closure: Perclose Prostyle  Anesthesia: GETA  Medication used: Refer to anesthesia documentation.  Complications: None.  Estimated blood loss: 50 mL.  Specimen: None.  Findings: Proximal left M1/MCA occlusion. Mechanical thrombectomy performed with direct contact aspiration. Total 1 pass with complete recanalization (TICI3). No thromboembolic or hemorrhagic complications.  The patient tolerated the procedure well without incident or complication and is in stable condition.   PLAN: - Bed rest x 6 hours post femoral artery puncture. - SBP: 120 - 140 mmHg.

## 2021-05-25 NOTE — Progress Notes (Signed)
Received report from PACU, Rennie Natter. Patient intubated with VS stable upon arrival to unit.  BP (!) 133/97   Pulse 89   Temp (!) 97.3 F (36.3 C) (Axillary)   Resp 16   Ht 5\' 4"  (1.626 m)   Wt 96 kg   LMP 09/05/2008   SpO2 100%   BMI 36.33 kg/m  Family to bedside and informed ICU policy, and informed of equipment. Please see flowsheet for full assessment.

## 2021-05-25 NOTE — ED Triage Notes (Signed)
Pt to ED from cancer center where sitting with husband (pt was not patient in cancer center), started having right side weakness, eyes drifting to left, pt unable to talk. Pt brought to rm 3. [place] Md and [employer] and [place] rn at bedside

## 2021-05-25 NOTE — Progress Notes (Addendum)
CODE STROKE- PHARMACY COMMUNICATION   Time CODE STROKE called/page received:1451  Time response to CODE STROKE was made (in person or via phone): 1451  Time Stroke Kit retrieved from [place] (only if needed):1455 *Labetalol 10 mg IVP x1 given*  *TNK indicated, no contraindications noted. Dose given*   Past Medical History:  Diagnosis Date   Allergy    allergic rhinitis   Anxiety    Cataract    removed both eyes    Infertility, female    Osteoarthritis of both knees    OA    PONV (postoperative nausea and vomiting)    Post-menopausal bleeding    neg endo bx.   Prior to Admission medications   Medication Sig Start Date End Date Taking? Authorizing Provider  albuterol (PROVENTIL HFA;VENTOLIN HFA) 108 (90 Base) MCG/ACT inhaler Inhale 1-2 puffs into the lungs every 6 (six) hours as needed for wheezing (or for cough). 02/23/17   Tonia Ghent, MD  CALCIUM PO Take by mouth as needed.     [provider]  cetirizine (ZYRTEC) 10 MG tablet Take 10 mg by mouth daily. Taking as needed    [provider]  Cholecalciferol (VITAMIN D3) 125 MCG (5000 UT) CAPS Take by mouth.    [provider]  fluticasone (FLONASE) 50 MCG/ACT nasal spray Place 2 sprays into both nostrils as needed. 02/23/17   Tonia Ghent, MD  Multiple Vitamins-Minerals (MULTIVITAMIN PO) Take by mouth as needed.     [provider]  naproxen sodium (ANAPROX) 220 MG tablet Take 220 mg by mouth as needed.    [provider]  Omega-3 Fatty Acids (FISH OIL PO) Take by mouth as needed.     [provider]   Harrington Challenger PharmD, BCPS 05/25/2021 4:56 PM

## 2021-05-25 NOTE — Progress Notes (Signed)
RT NOTES: Pt transported from PACU to 4N30 on vent without incident. Report given to 4N RT

## 2021-05-25 NOTE — Sedation Documentation (Signed)
Right femoral sheath removed. Perclose closure device deployed to groin site by Dr. Debbrah Alar.

## 2021-05-25 NOTE — ED Notes (Signed)
[facility] primed and prepared for [facility]. Current BOP 167/107.

## 2021-05-25 NOTE — ED Notes (Signed)
CBG 140  

## 2021-05-25 NOTE — ED Notes (Signed)
Code  stroke  called  to  doug  at  [place]

## 2021-05-25 NOTE — ED Notes (Signed)
CBG was checked prior to CT and was 140.

## 2021-05-25 NOTE — H&P (Signed)
Neurology H&P  Reason for Consult: Right MCA occlusion, transfer from [facility] Referring Physician: Dr. Cheral Marker  CC: Acute onset of right hemiplegia and aphasia  History is obtained from: Chart review, unable to obtain from patient due to intubated and sedated, no family was present on arrival to [facility]  HPI: Deborah Carey is a 70 y.o. female with a medical history significant for osteoarthritis, anxiety, and cataracts who was taken to [facility] ED from the cancer center where she was a visitor in the waiting area when she was noticed to have right-sided weakness, leftward gaze, and acutely became nonverbal. On arrival at [facility] her symptoms had persisted and she had an altered level of consciousness. CT imaging was obtained revealing an occlusion of the left M1 MCA segment with a large territory left MCA ischemic infarction. TNK was administered while at [facility] and she was transferred to [facility] for IR intervention. Prior to transfer, she developed acute respiratory failure requiring intubation in the ED. On arrival to [facility], patient was intubated and sedated. She had identified atrial fibrillation with a heart rate in the 160's on cardiac monitor on arrival without known or reported history of atrial fibrillation.  TNK given?: yes, at [facility] prior to transport to [facility] IR Thrombectomy? Yes, patient was transferred from [facility] to [facility] for IR.  Modified Rankin Scale: 0-Completely asymptomatic and back to baseline post- stroke  ROS: Unable to obtain due to altered mental status.   Past Medical History:  Diagnosis Date   Allergy    allergic rhinitis   Anxiety    Cataract    removed both eyes    Infertility, female    Osteoarthritis of both knees    OA    PONV (postoperative nausea and vomiting)    Post-menopausal bleeding    neg endo bx.   Past Surgical History:  Procedure Laterality Date   cataract surgery  2011   CESAREAN SECTION     times 2   COLONOSCOPY  2010   hems    ENDOMETRIAL  BIOPSY     neg for CA cells   FACIAL COSMETIC SURGERY  2006   face lift   FOOT SURGERY  1998   rt heel spur removal   REFRACTIVE SURGERY  08/1999   Family History  Problem Relation Age of Onset   COPD Brother    Stroke Mother    Hypertension Mother    Osteoporosis Mother    Alzheimer's disease Father    Hypertension Father    Osteoporosis Father    Depression Sister    Hypertension Sister    Osteoporosis Sister    Thyroid disease Sister    Hypothyroidism Sister    Parkinson's disease Sister    Osteoporosis Sister    Hypothyroidism Sister    Breast cancer Neg Hx    Colon polyps Neg Hx    Colon cancer Neg Hx    Esophageal cancer Neg Hx    Rectal cancer Neg Hx    Stomach cancer Neg Hx    Social History:   reports that she has never smoked. She has never used smokeless tobacco. She reports that she does not drink alcohol and does not use drugs.  Medications  Current Facility-Administered Medications:     stroke: mapping our early stages of recovery book, , Does not apply, Once, Toberman, Stevi W, NP   0.9 %  sodium chloride infusion, , Intravenous, Continuous, Toberman, Stevi W, NP   acetaminophen (TYLENOL) tablet 650 mg,  650 mg, Oral, Q4H PRN **OR** acetaminophen (TYLENOL) 160 MG/5ML solution 650 mg, 650 mg, Per Tube, Q4H PRN **OR** acetaminophen (TYLENOL) suppository 650 mg, 650 mg, Rectal, Q4H PRN, Rikki Spearing, NP   clevidipine (CLEVIPREX) infusion 0.5 mg/mL, 0-21 mg/hr, Intravenous, Continuous, de Sindy Messing, [place], MD   pantoprazole (PROTONIX) injection 40 mg, 40 mg, Intravenous, QHS, Toberman, Stevi W, NP   senna-docusate (Senokot-S) tablet 1 tablet, 1 tablet, Oral, QHS PRN, Rikki Spearing, NP  Current Outpatient Medications:    albuterol (PROVENTIL HFA;VENTOLIN HFA) 108 (90 Base) MCG/ACT inhaler, Inhale 1-2 puffs into the lungs every 6 (six) hours as needed for wheezing (or for cough)., Disp: 1 Inhaler, Rfl: 1   CALCIUM PO, Take by mouth as needed.  , Disp: , Rfl:    cetirizine (ZYRTEC) 10 MG tablet, Take 10 mg by mouth daily. Taking as needed, Disp: , Rfl:    Cholecalciferol (VITAMIN D3) 125 MCG (5000 UT) CAPS, Take by mouth., Disp: , Rfl:    fluticasone (FLONASE) 50 MCG/ACT nasal spray, Place 2 sprays into both nostrils as needed., Disp: 16 g, Rfl: 1   Multiple Vitamins-Minerals (MULTIVITAMIN PO), Take by mouth as needed. , Disp: , Rfl:    naproxen sodium (ANAPROX) 220 MG tablet, Take 220 mg by mouth as needed., Disp: , Rfl:    Omega-3 Fatty Acids (FISH OIL PO), Take by mouth as needed. , Disp: , Rfl:   Facility-Administered Medications Ordered in Other Encounters:    lactated ringers infusion, , Intravenous, Continuous PRN, Janace Litten, CRNA, New Bag at 05/25/21 1612   phenylephrine (NEO-SYNEPHRINE) 20mg /NS 278mL premix infusion, , Intravenous, Continuous PRN, Hessie Dibble T, CRNA, Stopped at 05/25/21 1645   PHENYLephrine 40 mcg/ml in normal saline Adult IV Push Syringe (For Blood Pressure Support), , Intravenous, Anesthesia Intra-op, Hessie Dibble T, CRNA, 120 mcg at 05/25/21 1637   propofol (DIPRIVAN) 500 MG/50ML infusion, , Intravenous, Continuous PRN, Hessie Dibble T, CRNA, Last Rate: 29.13 mL/hr at 05/25/21 1650, 50 mcg/kg/min at 05/25/21 1650   rocuronium bromide 10 mg/mL (PF) syringe, , Intravenous, Anesthesia Intra-op, Hessie Dibble T, CRNA, 70 mg at 05/25/21 1615  Exam: Current vital signs: Ht 5\' 4"  (1.626 m)   LMP 09/05/2008   BMI 36.73 kg/m  Vital signs in last 24 hours: Pulse Rate:  [89-126] 116 (09/20 1535) Resp:  [21-40] 21 (09/20 1535) BP: (128-167)/(65-136) 155/65 (09/20 1543) SpO2:  [82 %-99 %] 99 % (09/20 1535) Weight:  [97.1 kg] 97.1 kg (09/20 1508)  GENERAL: Intubated and sedated on EMS stretcher Psych: Unable to assess due to patient's condition, intubated and sedated Head: Normocephalic and atraumatic, without obvious abnormality EENT: Normal conjunctivae, normal appearing external ear LUNGS:  Oral ETT in place, secured, ventilation assisted via mechanical ventilator. SpO2 98% on cardiac monitor.  CV: Irregular rate and rhythm on cardiac monitor ABDOMEN: Soft, non-distended Extremities: warm, well perfused, without obvious deformity  NEURO: Neurologic assessment on arrival to [facility] is severely limited due to recent paralytic administration for intubation and sedation.  Mental Status: Intubated and sedated with recent paralytic.  She does not open eyes, does not follow commands, does not vocalize.  Does not withdraw to noxious stimuli.  Cranial Nerves:  II: PERRL 3 mm/brisk. visual fields full.  III, IV, VI: Patient does not open eyes due to recent RSI medications and sedation V: Unable to assess due to patient's condition VII: Unable to assess, patient is without facial movement, ETT in place.  VIII: Unable to assess due  to patient's condition IX, X: Cough and gag reflexes not tested due to recent RSI and continuous sedation, sedation not paused for assessment. XI: Head is grossly midline XII: Unable to assess due to patient's condition Motor: Unable to formally assess due to recent RSI and continuous sedation. Does not withdraw to noxious stimuli on arrival. Tone is flaccid. Bulk is normal.  Sensation: Unable to assess due to patient's condition Coordination: Unable to assess due to patient's condition  Plantars: Mute bilaterally Gait: Deferred  NIHSS prior to transfer per Dr. Yvetta Coder consultation note: NIHSS: 19  Labs I have reviewed labs in epic and the results pertinent to this consultation are: CBC    Component Value Date/Time   WBC 10.0 05/25/2021 1447   RBC 4.37 05/25/2021 1447   HGB 14.8 05/25/2021 1447   HCT 40.3 05/25/2021 1447   PLT 267 05/25/2021 1447   MCV 92.2 05/25/2021 1447   MCH 33.9 05/25/2021 1447   MCHC 36.7 (H) 05/25/2021 1447   RDW 12.3 05/25/2021 1447   LYMPHSABS 2.8 05/25/2021 1447   MONOABS 1.0 05/25/2021 1447   EOSABS 0.1 05/25/2021 1447    BASOSABS 0.1 05/25/2021 1447   CMP     Component Value Date/Time   NA 138 05/25/2021 1520   K 4.0 05/25/2021 1520   CL 102 05/25/2021 1520   CO2 26 05/25/2021 1520   GLUCOSE 128 (H) 05/25/2021 1520   BUN 18 05/25/2021 1520   CREATININE 0.83 05/25/2021 1520   CALCIUM 9.4 05/25/2021 1520   PROT 7.3 05/25/2021 1520   ALBUMIN 4.2 05/25/2021 1520   AST 20 05/25/2021 1520   ALT 16 05/25/2021 1520   ALKPHOS 68 05/25/2021 1520   BILITOT 0.8 05/25/2021 1520   GFRNONAA >60 05/25/2021 1520   GFRAA 132 06/06/2008 1118   Lipid Panel     Component Value Date/Time   CHOL 182 05/25/2020 1015   TRIG 90.0 05/25/2020 1015   HDL 48.60 05/25/2020 1015   CHOLHDL 4 05/25/2020 1015   VLDL 18.0 05/25/2020 1015   LDLCALC 116 (H) 05/25/2020 1015   LDLDIRECT 141.8 10/30/2013 0951   Lab Results  Component Value Date   HGBA1C 6.4 05/25/2020   Imaging I have reviewed the images obtained: DG Chest Portable 1 View CLINICAL DATA:  Shortness of breath  EXAM: PORTABLE CHEST 1 VIEW  COMPARISON:  None.  FINDINGS: Endotracheal tube terminates proximally 1.5 cm above the carina. Heart size appears mildly enlarged. Diffuse bilateral interstitial prominence. No pleural effusion or pneumothorax. Age indeterminate fractures of the posterior right seventh and likely eighth ribs.  IMPRESSION: 1. Endotracheal tube terminates 1.5 cm above the carina. 2. Diffuse bilateral interstitial prominence may reflect atelectasis, edema, versus atypical/viral infection. 3. Age indeterminate fractures of the posterior right seventh and likely eighth ribs.  Electronically Signed   By: Davina Poke D.O.   On: 05/25/2021 16:03 CT ANGIO HEAD NECK W WO CM (CODE STROKE) CLINICAL DATA:  Left-sided weakness  EXAM: CT ANGIOGRAPHY HEAD AND NECK  TECHNIQUE: Multidetector CT imaging of the head and neck was performed using the standard protocol during bolus administration of intravenous contrast. Multiplanar  CT image reconstructions and MIPs were obtained to evaluate the vascular anatomy. Carotid stenosis measurements (when applicable) are obtained utilizing NASCET criteria, using the distal internal carotid diameter as the denominator.  CONTRAST:  98mL OMNIPAQUE IOHEXOL 350 MG/ML SOLN  COMPARISON:  None.  FINDINGS: CT HEAD FINDINGS  Brain: There is ill-defined hypodensity in the left centrum semiovale. The left basal  ganglia and insular cortex are preserved. The cortex throughout the left MCA distribution is preserved.  There is focal hypodensity in the right parietal lobe likely reflecting remote infarct. Additional foci of hypodensity in the bilateral cerebral hemispheres are nonspecific but may reflect sequela of chronic white matter microangiopathy.  There is no evidence of acute intracranial hemorrhage or extra-axial fluid collection. The ventricles are not enlarged. There is no mass lesion. There is no midline shift.  Vascular: There is a dense MCA on the left. See below.  Skull: Normal. Negative for fracture or focal lesion.  Sinuses/orbits: The imaged paranasal sinuses are clear. The mastoid air cells are clear. Bilateral lens implants are in place. The globes and orbits are otherwise unremarkable.  Review of the MIP images confirms the above findings  CTA NECK FINDINGS  The CTA images are moderately degraded by motion artifact.  Aortic arch: Standard branching. Imaged portion shows no evidence of aneurysm or dissection. No significant stenosis of the major arch vessel origins.  Right carotid system: The right carotid system is suboptimally evaluated particularly in the region of the carotid bulb due to motion artifact. The right internal carotid artery has a medialized course. Otherwise, there is no definite focal stenosis, occlusion, dissection, or aneurysm.  Left carotid system: The left carotid system is suboptimally evaluated due to motion artifact  particularly in the region of the carotid bulb. There is calcified atherosclerotic plaque of the left carotid bulb without definite hemodynamically significant stenosis or occlusion. There is no definite dissection or aneurysm.  Vertebral arteries: The vertebral arteries appear patent, without definite focal stenosis, dissection, occlusion, or aneurysm.  Skeleton: There is mild multilevel degenerative change of the cervical spine. There is no acute osseous abnormality or aggressive osseous lesion.  Other neck: The soft tissues are unremarkable.  Upper chest: There is a 1.1 cm x 1.0 cm soft tissue nodule in the medial right apex.  Review of the MIP images confirms the above findings  CTA HEAD FINDINGS  Anterior circulation: There is calcification of the bilateral cavernous ICAs without focal stenosis or occlusion.  There is occlusion of the proximal left M1 segment with reconstitution of flow at the M2 segments. There appears to be good collateral flow throughout the peripheral MCA distribution.  The right MCA is patent.  The bilateral ACAs are patent. The right A1 segment is diminutive. There is no aneurysm.  Posterior circulation: The posterior circulation is suboptimally evaluated due to motion artifact. The V4 segments of the vertebral arteries are patent. The basilar artery is patent. There is multifocal moderate stenosis of the left P2 segment (9-256, 10-116, 10-123).  The right PCA is patent.  There is no aneurysm.  Venous sinuses: As permitted by contrast timing, patent.  Anatomic variants: None.  Review of the MIP images confirms the above findings  IMPRESSION: 1. Occlusion of the left M1 segment with reconstitution of flow at the M2 segments with good collateral flow throughout the remainder of the MCA distribution. 2. No evidence of left MCA territory infarct at the time of imaging. Aspects is 10. 3. The CTA images are moderately motion degraded. There  is no definite high-grade stenosis in the neck. 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 4. 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 Other foci of hypodensity in the supratentorial white matter likely reflects sequela of chronic white matter microangiopathy. 5. 1.1 cm soft tissue nodule in the medial right apex. Consider one of the following  in 3 months for both low-risk and high-risk individuals: (a) repeat chest CT, (b) follow-up PET-CT, or (c) tissue sampling. This recommendation follows the consensus statement: Guidelines for Management of Incidental Pulmonary Nodules Detected on CT Images: From the Fleischner Society 2017; Radiology 2017; 284:228-243.  The acute results were called by telephone at the time of interpretation on 05/25/2021 at 3:05 pm to provider Dr Cheral Marker , who verbally acknowledged these results.  Electronically Signed   By: Valetta Mole M.D.   On: 05/25/2021 15:22 CT HEAD CODE STROKE WO CONTRAST CLINICAL DATA:  Left-sided weakness  EXAM: CT ANGIOGRAPHY HEAD AND NECK  TECHNIQUE: Multidetector CT imaging of the head and neck was performed using the standard protocol during bolus administration of intravenous contrast. Multiplanar CT image reconstructions and MIPs were obtained to evaluate the vascular anatomy. Carotid stenosis measurements (when applicable) are obtained utilizing NASCET criteria, using the distal internal carotid diameter as the denominator.  CONTRAST:  52mL OMNIPAQUE IOHEXOL 350 MG/ML SOLN  COMPARISON:  None.  FINDINGS: CT HEAD FINDINGS  Brain: There is ill-defined hypodensity in the left centrum semiovale. The left basal ganglia and insular cortex are preserved. The cortex throughout the left MCA distribution is preserved.  There is focal hypodensity in the right parietal lobe likely reflecting remote infarct. Additional foci of hypodensity in the bilateral cerebral hemispheres are  nonspecific but may reflect sequela of chronic white matter microangiopathy.  There is no evidence of acute intracranial hemorrhage or extra-axial fluid collection. The ventricles are not enlarged. There is no mass lesion. There is no midline shift.  Vascular: There is a dense MCA on the left. See below.  Skull: Normal. Negative for fracture or focal lesion.  Sinuses/orbits: The imaged paranasal sinuses are clear. The mastoid air cells are clear. Bilateral lens implants are in place. The globes and orbits are otherwise unremarkable.  Review of the MIP images confirms the above findings  CTA NECK FINDINGS  The CTA images are moderately degraded by motion artifact.  Aortic arch: Standard branching. Imaged portion shows no evidence of aneurysm or dissection. No significant stenosis of the major arch vessel origins.  Right carotid system: The right carotid system is suboptimally evaluated particularly in the region of the carotid bulb due to motion artifact. The right internal carotid artery has a medialized course. Otherwise, there is no definite focal stenosis, occlusion, dissection, or aneurysm.  Left carotid system: The left carotid system is suboptimally evaluated due to motion artifact particularly in the region of the carotid bulb. There is calcified atherosclerotic plaque of the left carotid bulb without definite hemodynamically significant stenosis or occlusion. There is no definite dissection or aneurysm.  Vertebral arteries: The vertebral arteries appear patent, without definite focal stenosis, dissection, occlusion, or aneurysm.  Skeleton: There is mild multilevel degenerative change of the cervical spine. There is no acute osseous abnormality or aggressive osseous lesion.  Other neck: The soft tissues are unremarkable.  Upper chest: There is a 1.1 cm x 1.0 cm soft tissue nodule in the medial right apex.  Review of the MIP images confirms the above  findings  CTA HEAD FINDINGS  Anterior circulation: There is calcification of the bilateral cavernous ICAs without focal stenosis or occlusion.  There is occlusion of the proximal left M1 segment with reconstitution of flow at the M2 segments. There appears to be good collateral flow throughout the peripheral MCA distribution.  The right MCA is patent.  The bilateral ACAs are patent. The right A1 segment is diminutive. There  is no aneurysm.  Posterior circulation: The posterior circulation is suboptimally evaluated due to motion artifact. The V4 segments of the vertebral arteries are patent. The basilar artery is patent. There is multifocal moderate stenosis of the left P2 segment (9-256, 10-116, 10-123).  The right PCA is patent.  There is no aneurysm.  Venous sinuses: As permitted by contrast timing, patent.  Anatomic variants: None.  Review of the MIP images confirms the above findings  IMPRESSION: 1. Occlusion of the left M1 segment with reconstitution of flow at the M2 segments with good collateral flow throughout the remainder of the MCA distribution. 2. No evidence of left MCA territory infarct at the time of imaging. Aspects is 10. 3. The CTA images are moderately motion degraded. There is no definite high-grade stenosis in the neck. 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 4. 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 Other foci of hypodensity in the supratentorial white matter likely reflects sequela of chronic white matter microangiopathy. 5. 1.1 cm soft tissue nodule in the medial right apex. Consider one of the following in 3 months for both low-risk and high-risk individuals: (a) repeat chest CT, (b) follow-up PET-CT, or (c) tissue sampling. This recommendation follows the consensus statement: Guidelines for Management of Incidental Pulmonary Nodules Detected on CT Images: From the Fleischner Society 2017;  Radiology 2017; 284:228-243.  The acute results were called by telephone at the time of interpretation on 05/25/2021 at 3:05 pm to provider Dr Cheral Marker , who verbally acknowledged these results.  Electronically Signed   By: Valetta Mole M.D.   On: 05/25/2021 15:22  Assessment: 70 year old female presenting as a transfer from [facility] for IR procedure for acute left M1 ischemic infarction with occlusion.  1. Exam findings are most consistent with an acute, large territory left MCA ischemic infarction.  2. CT head negative for acute hemorrhage. No evidence of left MCA territory infarct at the time of imaging. Aspects is 10. 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 Other foci of hypodensity in the supratentorial white matter likely reflects sequela of chronic white matter microangiopathy.  3. CTA of head: Occlusion of the left M1 segment with reconstitution of flow at the M2 segments with good collateral flow throughout the remainder of the MCA distribution. There is multifocal moderate stenosis of the left P2 segmen. 4. CTA of neck: There is no definite high-grade stenosis in the neck.  5. TNK administered at [facility] prior to initiation of transfer for IR thrombectomy.  6. The patient is a VIR candidate. Risks/benefits of the procedure were discussed extensively with patient's husband, including approximately 50% chance of significant improvement relative to an approximate 10% chance of subarachnoid hemorrhage with possibility of significant worsening including death. The patient's husband expressed understanding and provided informed consent to proceed with VIR. All questions answered. Consent form signed. Patient transferred to [facility] for IR procedure. 7. Acute respiratory failure. Intubated in the [facility] ED prior to transfer.  8. Cardiac monitor with evidence of atrial fibrillation with RVR with heart rate in the 160's on arrival. There is no reported history of AF. Etiology of stroke is likely  cardioembolic due to the presence of newly identified atrial fibrillation without anticoagulation.   Plan: Acute Ischemic Stroke Cerebral infarction due to embolic occlusion of right middle cerebral artery  Acuity: Acute Current Suspected Etiology: Large vessel M1 occlusion; likely cardioembolic with newly identified atrial fibrillation on cardiac monitor Continue Evaluation:  -Admit  to: ICU -Hold Aspirin until 24 hour post tPA neuroimaging is stable and without evidence of bleeding -Blood pressure control, goal per IR -MRI/ECHO/A1C/Lipid panel. -Hyperglycemia management per SSI to maintain glucose 140-180mg /dL. -PT/OT/ST therapies and recommendations when able  CNS Cerebral edema -Close neuro monitoring  Concern for dysarthria or dysphagia following cerebral infarction  -NPO until cleared by speech -ST when able   Hemiplegia and hemiparesis following cerebral infarction affecting right dominant side -PT/OT -PM&R consult  RESP Acute Respiratory Failure  -vent management per ICU -wean when able -maintain SpO2 > 94%  CV Blood pressure goals per IR  Fasting lipid panel pending - Statin as needed for goal LDL < 70  Newly identified atrial fibrillation -Rate control -Repeat CT in 2 weeks for consideration of starting anticoagulation if CT is stable.   HEME AM CBC  -Monitor -Transfuse for hgb < 7  ENDO Hemoglobin A1c pending -goal HgbA1c < 7%  GI/GU -Gentle hydration  Fluid/Electrolyte Disorders AM CMP  -Replete -Repeat labs -Trend  ID Evaluate for infection -Trend WBC and fever curve -UA -CXR  Prophylaxis DVT:  SCDs GI: PPI Bowel: Docusate / Senna  Diet: NPO until cleared by speech  Code Status: Full Code    THE FOLLOWING WERE PRESENT ON ADMISSION: CNS -  Acute Ischemic Stroke, Hemiparesis, Hemiplegia Respiratory - Respiratory failure requiring intubation Cardiovascular - Cardiac Arrhythmia Infectious - N/A GI - N/A Renal -  N/A Heme-   Coagulopathy secondary to tPA at outside hospital [place], AGAC-NP [facility] Pager: [phone]   Attending attestation:  Patient seen, examined, labs,vitals and notes reviewed. Discussed plan with Anibal Henderson, NP and agree with assessment and plan as documented above. I have independently reviewed the chart, obtained history, review of systems and examined the patient.  Electronically signed by:  Lynnae Sandhoff, MD Page: [phone] 05/25/2021, 5:52 PM

## 2021-05-25 NOTE — Transfer of Care (Addendum)
Immediate Anesthesia Transfer of Care Note  Patient: Debara Kamphuis Cina  Procedure(s) Performed: IR WITH ANESTHESIA  Patient Location: PACU  Anesthesia Type:General  Level of Consciousness: Patient remains intubated per anesthesia plan  Airway & Oxygen Therapy: Patient remains intubated per anesthesia plan and Patient placed on Ventilator (see vital sign flow sheet for setting)  Post-op Assessment: Report given to RN and Post -op Vital signs reviewed and stable  Post vital signs: Reviewed and stable  Last Vitals:  Vitals Value Taken Time  BP 101/86 05/25/21 1731  Temp    Pulse 115 05/25/21 1731  Resp 16 05/25/21 1731  SpO2 99 % 05/25/21 1731  Vitals shown include unvalidated device data.  Last Pain: There were no vitals filed for this visit.       Complications: No notable events documented.

## 2021-05-25 NOTE — ED Notes (Signed)
Pt back in room from CT. Neurologist was in CT as well.

## 2021-05-25 NOTE — ED Notes (Signed)
Nerurologist and EDP at bedside. Talking with husband.

## 2021-05-25 NOTE — ED Notes (Signed)
Called RT to come for intubation.

## 2021-05-25 NOTE — ED Notes (Signed)
Pt to CT with Nira Conn rn and Minette Brine rn

## 2021-05-25 NOTE — Progress Notes (Signed)
[facility] Progress Note Patient Name: Deborah Carey DOB: 1951-07-04 MRN: [id number]   Date of Service  05/25/2021  HPI/Events of Note  Pt on prop gtt; although order not active  eICU Interventions  Ordered prop.     Intervention Category Minor Interventions: Other:  [place] Dome 05/25/2021, 8:20 PM

## 2021-05-25 NOTE — Code Documentation (Signed)
Stroke Response Nurse Documentation Code Documentation  Deborah Carey is a 70 y.o. female arriving to [facility] ED via [place] on 9/20.Code stroke was activated by cancer center nurse when they noted her slumped over in the chair. Pt was with her friend waiting on her in the appointment when nursing staff noted her slumped over. They had initially seen the patient well upon arrival.  Stroke team at the bedside on patient arrival. Labs drawn and patient cleared for CT by Dr. Charna Archer. Patient to CT with team. The following imaging was completed:  CT, CTA head.  Care/Plan: Pt to be given tnk and to be transferred to [facility] for IR.   Velta Addison  Stroke Response RN

## 2021-05-25 NOTE — Consult Note (Signed)
NAME:  Deborah Carey, MRN:  [id number], DOB:  1950/10/26, LOS: 0 ADMISSION DATE:  05/25/2021, CONSULTATION DATE:  05/25/21 REFERRING MD: Theda Sers , CHIEF COMPLAINT:  R sided weakness, stroke   History of Present Illness:  Deborah Carey is a 70 y.o. F with PMH of cataracts, anxiety, osteoarthritis who was at the cancer center with her husband when she started having right-sided weakness with left eye deviation and aphasia.  She was brought to the ED as a code stroke.  She is not on a blood thinner and no history of stroke risk factors.  Pt presented to [facility] ED and received TNK intubated for worsening mental status, transferred emergently to [facility].  She was taken emergently to CT and started on Cleviprex for hypertension.  CTA with left M1/MCA occlusion and she was taken emergently to IR for mechanical thrombectomy which resulted in complete recanalization.  She was intubated prior to the procedure and PCCM consulted for vent management  Pertinent  Medical History   has a past medical history of Allergy, Anxiety, Cataract, Infertility, female, Osteoarthritis of both knees, PONV (postoperative nausea and vomiting), and Post-menopausal bleeding.  Significant Hospital Events: Including procedures, antibiotic start and stop dates in addition to other pertinent events   9/20 Acute R sided weakness, code stroke, TNK and to IR for emergent embolization of L  M1/MCA occlusion.  ETT and PCCM consult  Interim History / Subjective:  Transferred to ICU post-op, hemodynamically stable TICI3  Objective   Height 5\' 4"  (1.626 m), last menstrual period 09/05/2008, SpO2 100 %.    Vent Mode: PRVC FiO2 (%):  [60 %] 60 % Set Rate:  [16 bmp] 16 bmp Vt Set:  [430 mL] 430 mL PEEP:  [5 cmH20] 5 cmH20 Plateau Pressure:  [17 cmH20] 17 cmH20  No intake or output data in the 24 hours ending 05/25/21 1726 There were no vitals filed for this visit.  General:  well-nourished F, intubated and sedated HEENT: MM  pink/moist, ETT in place, pupils equal Neuro: examined on Propofol 60mg , withdraws to pain L extremities and RLE, no movement RUE CV: s1s2 rrr, no m/r/g PULM: clear bilaterally on full vent support without  GI: soft, bsx4 active  Extremities: warm/dry, no edema  Skin: no rashes or lesions   Resolved Hospital Problem list     Assessment & Plan:   Acute respiratory insufficiency secondary to acute encephalopathy in the setting of CVA - Full MV support, 8cc/kg IBW,  - CXR and ABG now - VAP prevention protocol/ PPI - PAD protocol for sedation> propofol and prn fentanyl for RASS goal 0/-1 with bowel regimen - wean FiO2 as able for SpO2 >92%  - daily SAT & SBT - NPO still SLP once extubated      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 with complete revascularization with TICI3 - likely secondary to new onset Afib  - per stroke team and NIR - best rest x 6 hrs  - SBP goal 120-140 - further imaging/ MRI per Neurology - serial neuro exams  - pending further stroke workup: TTE, lipid panel, A1c - will need PT/ OT/ SLP when able - trend CBC/ renal panel - strict I/Os/ daily weights      Hypertensive emergency  - cleviprex for SBP goal 120-140 - not on home antihypertensives      Atrial fibrillation - new onset - currently rate controlled - no AC for now s/p TNK  -echo pending  -check TSH  Best Practice (right click and "Reselect all SmartList Selections" daily)   Diet/type: NPO DVT prophylaxis: SCD GI prophylaxis: PPI Lines: N/A Foley:  Yes, and it is still needed Code Status:  full code Last date of multidisciplinary goals of care discussion [per primary]  Labs   CBC: Recent Labs  Lab 05/25/21 1447  WBC 10.0  NEUTROABS 5.9  HGB 14.8  HCT 40.3  MCV 92.2  PLT 409    Basic Metabolic Panel: Recent Labs  Lab 05/25/21 1520  NA 138  K 4.0  CL 102  CO2 26  GLUCOSE 128*  BUN 18  CREATININE 0.83  CALCIUM 9.4   GFR: Estimated  Creatinine Clearance: 69.3 mL/min (by C-G formula based on SCr of 0.83 mg/dL). Recent Labs  Lab 05/25/21 1447  WBC 10.0    Liver Function Tests: Recent Labs  Lab 05/25/21 1520  AST 20  ALT 16  ALKPHOS 68  BILITOT 0.8  PROT 7.3  ALBUMIN 4.2   No results for input(s): LIPASE, AMYLASE in the last 168 hours. No results for input(s): AMMONIA in the last 168 hours.  ABG    Component Value Date/Time   HCO3 26.5 05/25/2021 1523   O2SAT 88.6 05/25/2021 1523     Coagulation Profile: Recent Labs  Lab 05/25/21 1447  INR 0.9    Cardiac Enzymes: No results for input(s): CKTOTAL, CKMB, CKMBINDEX, TROPONINI in the last 168 hours.  HbA1C: Hgb A1c MFr Bld  Date/Time Value Ref Range Status  05/25/2020 10:15 AM 6.4 4.6 - 6.5 % Final    Comment:    Glycemic Control Guidelines for People with Diabetes:Non Diabetic:  <6%Goal of Therapy: <7%Additional Action Suggested:  >8%   05/09/2019 10:05 AM 6.4 4.6 - 6.5 % Final    Comment:    Glycemic Control Guidelines for People with Diabetes:Non Diabetic:  <6%Goal of Therapy: <7%Additional Action Suggested:  >8%     CBG: No results for input(s): GLUCAP in the last 168 hours.  Review of Systems:   Unable to obtain secondary to mental status  Past Medical History:  She,  has a past medical history of Allergy, Anxiety, Cataract, Infertility, female, Osteoarthritis of both knees, PONV (postoperative nausea and vomiting), and Post-menopausal bleeding.   Surgical History:   Past Surgical History:  Procedure Laterality Date   cataract surgery  2011   CESAREAN SECTION     times 2   COLONOSCOPY  2010   hems    ENDOMETRIAL BIOPSY     neg for CA cells   FACIAL COSMETIC SURGERY  2006   face lift   FOOT SURGERY  1998   rt heel spur removal   REFRACTIVE SURGERY  08/1999     Social History:   reports that she has never smoked. She has never used smokeless tobacco. She reports that she does not drink alcohol and does not use drugs.    Family History:  Her family history includes Alzheimer's disease in her father; COPD in her brother; Depression in her sister; Hypertension in her father, mother, and sister; Hypothyroidism in her sister and sister; Osteoporosis in her father, mother, sister, and sister; Parkinson's disease in her sister; Stroke in her mother; Thyroid disease in her sister. There is no history of Breast cancer, Colon polyps, Colon cancer, Esophageal cancer, Rectal cancer, or Stomach cancer.   Allergies No Known Allergies   Home Medications  Prior to Admission medications   Medication Sig Start Date End Date Taking? Authorizing Provider  albuterol (PROVENTIL HFA;VENTOLIN  HFA) 108 (90 Base) MCG/ACT inhaler Inhale 1-2 puffs into the lungs every 6 (six) hours as needed for wheezing (or for cough). 02/23/17   Tonia Ghent, MD  CALCIUM PO Take by mouth as needed.     [provider]  cetirizine (ZYRTEC) 10 MG tablet Take 10 mg by mouth daily. Taking as needed    [provider]  Cholecalciferol (VITAMIN D3) 125 MCG (5000 UT) CAPS Take by mouth.    [provider]  fluticasone (FLONASE) 50 MCG/ACT nasal spray Place 2 sprays into both nostrils as needed. 02/23/17   Tonia Ghent, MD  Multiple Vitamins-Minerals (MULTIVITAMIN PO) Take by mouth as needed.     [provider]  naproxen sodium (ANAPROX) 220 MG tablet Take 220 mg by mouth as needed.    [provider]  Omega-3 Fatty Acids (FISH OIL PO) Take by mouth as needed.     [provider]     Critical care time: 40 minutes     CRITICAL CARE Performed by: Otilio Carpen Antavius Sperbeck   Total critical care time: 40 minutes  Critical care time was exclusive of separately billable procedures and treating other patients.  Critical care was necessary to treat or prevent imminent or life-threatening deterioration.  Critical care was time spent personally by me on the following activities: development of treatment  plan with patient and/or surrogate as well as nursing, discussions with consultants, evaluation of patient's response to treatment, examination of patient, obtaining history from patient or surrogate, ordering and performing treatments and interventions, ordering and review of laboratory studies, ordering and review of radiographic studies, pulse oximetry and re-evaluation of patient's condition.   Otilio Carpen Kamarri Lovvorn, PA-C [facility] See Amion for pager If no response to pager , please call [phone] until 7pm After 7:00 pm call Elink  [phone]?[place]

## 2021-05-25 NOTE — ED Notes (Signed)
Minette Brine, RN called IR at [facility] and gave report to [place], Therapist, sports.

## 2021-05-25 NOTE — Consult Note (Addendum)
NEURO HOSPITALIST CONSULT NOTE   Requestig physician: Dr. Charna Archer  Reason for Consult: Acute onset of aphasia and right hemiplegia  History obtained from:  Husband and Chart     HPI:                                                                                                                                          Deborah Carey is an 70 y.o. female with a PMHx of osteoarthritis and cataracts who presented emergently to the ED from the cancer center where, while sitting with husband, she started having right side weakness, with eyes drifting to left, and unable to talk. Code Stroke was called. Initially on stroke nurse assessment she had all of the above evident as well as altered level of consciousness. She was emergently taken to CT.   Per husband, she has never had a stroke before. She has no history of MI. She is not on a blood thinner. No recent bleeding.   NIHSS: 19 mRS: 0 TNK administered: Yes  Past Medical History:  Diagnosis Date   Allergy    allergic rhinitis   Anxiety    Cataract    removed both eyes    Infertility, female    Osteoarthritis of both knees    OA    PONV (postoperative nausea and vomiting)    Post-menopausal bleeding    neg endo bx.    Past Surgical History:  Procedure Laterality Date   cataract surgery  2011   CESAREAN SECTION     times 2   COLONOSCOPY  2010   hems    ENDOMETRIAL BIOPSY     neg for CA cells   FACIAL COSMETIC SURGERY  2006   face lift   FOOT SURGERY  1998   rt heel spur removal   REFRACTIVE SURGERY  08/1999    Family History  Problem Relation Age of Onset   COPD Brother    Stroke Mother    Hypertension Mother    Osteoporosis Mother    Alzheimer's disease Father    Hypertension Father    Osteoporosis Father    Depression Sister    Hypertension Sister    Osteoporosis Sister    Thyroid disease Sister    Hypothyroidism Sister    Parkinson's disease Sister    Osteoporosis Sister     Hypothyroidism Sister    Breast cancer Neg Hx    Colon polyps Neg Hx    Colon cancer Neg Hx    Esophageal cancer Neg Hx    Rectal cancer Neg Hx    Stomach cancer Neg Hx              Social History:  reports that she has never smoked. She has never used smokeless tobacco. She  reports that she does not drink alcohol and does not use drugs.  No Known Allergies  MEDICATIONS:                                                                                                                     No current facility-administered medications on file prior to encounter.   Current Outpatient Medications on File Prior to Encounter  Medication Sig Dispense Refill   albuterol (PROVENTIL HFA;VENTOLIN HFA) 108 (90 Base) MCG/ACT inhaler Inhale 1-2 puffs into the lungs every 6 (six) hours as needed for wheezing (or for cough). 1 Inhaler 1   CALCIUM PO Take by mouth as needed.      cetirizine (ZYRTEC) 10 MG tablet Take 10 mg by mouth daily. Taking as needed     Cholecalciferol (VITAMIN D3) 125 MCG (5000 UT) CAPS Take by mouth.     fluticasone (FLONASE) 50 MCG/ACT nasal spray Place 2 sprays into both nostrils as needed. 16 g 1   Multiple Vitamins-Minerals (MULTIVITAMIN PO) Take by mouth as needed.      naproxen sodium (ANAPROX) 220 MG tablet Take 220 mg by mouth as needed.     Omega-3 Fatty Acids (FISH OIL PO) Take by mouth as needed.       ROS:                                                                                                                                       Unable to obtain due to aphasia.    Blood pressure 128/87, pulse (!) 126, resp. rate (!) 26, weight 97.1 kg, last menstrual period 09/05/2008, SpO2 98 %.   General Examination:                                                                                                       Physical Exam  General: Agitated and diaphoretic HEENT-  Needles/AT    Lungs- Tachypneic Extremities- Warm and well perfused   Neurological  Examination Mental Status:  Awake with altered sensorium. Agitated and periodically thrashing on the bed. Dense expressive aphasia. Language comprehension severely impaired; follows about 10% of simple motor commands.  Cranial Nerves: II: No blink to threat on the right. PERRL.  III,IV, VI: Left gaze preference. Can cross to the right with some difficulty.  V: Decreased responses to right sided sensory stimulation VII: Right facial droop.  VIII: Hearing intact for some commands IX,X: Unable to assess XI: Unable to assess shoulder shrug XII: Unable to assess Motor: LUE and LLE 5/5 RUE 0/5 LLE 2-3/5 Sensory: No responses to right upper extremity noxious. Attenuated response to noxious applied to RLE. LUE and LLE with brisk responses to noxious.  Deep Tendon Reflexes: 2+ and symmetric throughout. Toes upgoing bilaterally  Cerebellar: Unable to assess Gait: Unable to assess   Lab Results: Basic Metabolic Panel: No results for input(s): NA, K, CL, CO2, GLUCOSE, BUN, CREATININE, CALCIUM, MG, PHOS in the last 168 hours.  CBC: Recent Labs  Lab 05/25/21 1447  WBC 10.0  NEUTROABS 5.9  HGB 14.8  HCT 40.3  MCV 92.2  PLT 267    Cardiac Enzymes: No results for input(s): CKTOTAL, CKMB, CKMBINDEX, TROPONINI in the last 168 hours.  Lipid Panel: No results for input(s): CHOL, TRIG, HDL, CHOLHDL, VLDL, LDLCALC in the last 168 hours.  Imaging: CT HEAD CODE STROKE WO CONTRAST  Result Date: 05/25/2021 CLINICAL DATA:  Left-sided weakness EXAM: CT ANGIOGRAPHY HEAD AND NECK TECHNIQUE: Multidetector CT imaging of the head and neck was performed using the standard protocol during bolus administration of intravenous contrast. Multiplanar CT image reconstructions and MIPs were obtained to evaluate the vascular anatomy. Carotid stenosis measurements (when applicable) are obtained utilizing NASCET criteria, using the distal internal carotid diameter as the denominator. CONTRAST:  40mL OMNIPAQUE IOHEXOL  350 MG/ML SOLN COMPARISON:  None. FINDINGS: CT HEAD FINDINGS Brain: There is ill-defined hypodensity in the left centrum semiovale. The left basal ganglia and insular cortex are preserved. The cortex throughout the left MCA distribution is preserved. There is focal hypodensity in the right parietal lobe likely reflecting remote infarct. Additional foci of hypodensity in the bilateral cerebral hemispheres are nonspecific but may reflect sequela of chronic white matter microangiopathy. There is no evidence of acute intracranial hemorrhage or extra-axial fluid collection. The ventricles are not enlarged. There is no mass lesion. There is no midline shift. Vascular: There is a dense MCA on the left. See below. Skull: Normal. Negative for fracture or focal lesion. Sinuses/orbits: The imaged paranasal sinuses are clear. The mastoid air cells are clear. Bilateral lens implants are in place. The globes and orbits are otherwise unremarkable. Review of the MIP images confirms the above findings CTA NECK FINDINGS The CTA images are moderately degraded by motion artifact. Aortic arch: Standard branching. Imaged portion shows no evidence of aneurysm or dissection. No significant stenosis of the major arch vessel origins. Right carotid system: The right carotid system is suboptimally evaluated particularly in the region of the carotid bulb due to motion artifact. The right internal carotid artery has a medialized course. Otherwise, there is no definite focal stenosis, occlusion, dissection, or aneurysm. Left carotid system: The left carotid system is suboptimally evaluated due to motion artifact particularly in the region of the carotid bulb. There is calcified atherosclerotic plaque of the left carotid bulb without definite hemodynamically significant stenosis or occlusion. There is no definite dissection or aneurysm. Vertebral arteries: The vertebral arteries appear patent, without definite focal stenosis, dissection,  occlusion, or aneurysm. Skeleton: There is  mild multilevel degenerative change of the cervical spine. There is no acute osseous abnormality or aggressive osseous lesion. Other neck: The soft tissues are unremarkable. Upper chest: There is a 1.1 cm x 1.0 cm soft tissue nodule in the medial right apex. Review of the MIP images confirms the above findings CTA HEAD FINDINGS Anterior circulation: There is calcification of the bilateral cavernous ICAs without focal stenosis or occlusion. There is occlusion of the proximal left M1 segment with reconstitution of flow at the M2 segments. There appears to be good collateral flow throughout the peripheral MCA distribution. The right MCA is patent. The bilateral ACAs are patent. The right A1 segment is diminutive. There is no aneurysm. Posterior circulation: The posterior circulation is suboptimally evaluated due to motion artifact. The V4 segments of the vertebral arteries are patent. The basilar artery is patent. There is multifocal moderate stenosis of the left P2 segment (9-256, 10-116, 10-123). The right PCA is patent.  There is no aneurysm. Venous sinuses: As permitted by contrast timing, patent. Anatomic variants: None. Review of the MIP images confirms the above findings IMPRESSION: 1. Occlusion of the left M1 segment with reconstitution of flow at the M2 segments with good collateral flow throughout the remainder of the MCA distribution. 2. No evidence of left MCA territory infarct at the time of imaging. Aspects is 10. 3. The CTA images are moderately motion degraded. There is no definite high-grade stenosis in the neck. 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 4. 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 Other foci of hypodensity in the supratentorial white matter likely reflects sequela of chronic white matter microangiopathy. 5. 1.1 cm soft tissue nodule in the medial right apex. Consider one of the following in 3 months for  both low-risk and high-risk individuals: (a) repeat chest CT, (b) follow-up PET-CT, or (c) tissue sampling. This recommendation follows the consensus statement: Guidelines for Management of Incidental Pulmonary Nodules Detected on CT Images: From the Fleischner Society 2017; Radiology 2017; 284:228-243. The acute results were called by telephone at the time of interpretation on 05/25/2021 at 3:05 pm to provider Dr Cheral Marker , who verbally acknowledged these results. Electronically Signed   By: Valetta Mole M.D.   On: 05/25/2021 15:22   CT ANGIO HEAD NECK W WO CM (CODE STROKE)  Result Date: 05/25/2021 CLINICAL DATA:  Left-sided weakness EXAM: CT ANGIOGRAPHY HEAD AND NECK TECHNIQUE: Multidetector CT imaging of the head and neck was performed using the standard protocol during bolus administration of intravenous contrast. Multiplanar CT image reconstructions and MIPs were obtained to evaluate the vascular anatomy. Carotid stenosis measurements (when applicable) are obtained utilizing NASCET criteria, using the distal internal carotid diameter as the denominator. CONTRAST:  20mL OMNIPAQUE IOHEXOL 350 MG/ML SOLN COMPARISON:  None. FINDINGS: CT HEAD FINDINGS Brain: There is ill-defined hypodensity in the left centrum semiovale. The left basal ganglia and insular cortex are preserved. The cortex throughout the left MCA distribution is preserved. There is focal hypodensity in the right parietal lobe likely reflecting remote infarct. Additional foci of hypodensity in the bilateral cerebral hemispheres are nonspecific but may reflect sequela of chronic white matter microangiopathy. There is no evidence of acute intracranial hemorrhage or extra-axial fluid collection. The ventricles are not enlarged. There is no mass lesion. There is no midline shift. Vascular: There is a dense MCA on the left. See below. Skull: Normal. Negative for fracture or focal lesion. Sinuses/orbits: The imaged paranasal sinuses are clear. The mastoid  air cells are clear. Bilateral lens implants are in place. The globes and orbits are otherwise unremarkable. Review of the MIP images confirms the above findings CTA NECK FINDINGS The CTA images are moderately degraded by motion artifact. Aortic arch: Standard branching. Imaged portion shows no evidence of aneurysm or dissection. No significant stenosis of the major arch vessel origins. Right carotid system: The right carotid system is suboptimally evaluated particularly in the region of the carotid bulb due to motion artifact. The right internal carotid artery has a medialized course. Otherwise, there is no definite focal stenosis, occlusion, dissection, or aneurysm. Left carotid system: The left carotid system is suboptimally evaluated due to motion artifact particularly in the region of the carotid bulb. There is calcified atherosclerotic plaque of the left carotid bulb without definite hemodynamically significant stenosis or occlusion. There is no definite dissection or aneurysm. Vertebral arteries: The vertebral arteries appear patent, without definite focal stenosis, dissection, occlusion, or aneurysm. Skeleton: There is mild multilevel degenerative change of the cervical spine. There is no acute osseous abnormality or aggressive osseous lesion. Other neck: The soft tissues are unremarkable. Upper chest: There is a 1.1 cm x 1.0 cm soft tissue nodule in the medial right apex. Review of the MIP images confirms the above findings CTA HEAD FINDINGS Anterior circulation: There is calcification of the bilateral cavernous ICAs without focal stenosis or occlusion. There is occlusion of the proximal left M1 segment with reconstitution of flow at the M2 segments. There appears to be good collateral flow throughout the peripheral MCA distribution. The right MCA is patent. The bilateral ACAs are patent. The right A1 segment is diminutive. There is no aneurysm. Posterior circulation: The posterior circulation is  suboptimally evaluated due to motion artifact. The V4 segments of the vertebral arteries are patent. The basilar artery is patent. There is multifocal moderate stenosis of the left P2 segment (9-256, 10-116, 10-123). The right PCA is patent.  There is no aneurysm. Venous sinuses: As permitted by contrast timing, patent. Anatomic variants: None. Review of the MIP images confirms the above findings IMPRESSION: 1. Occlusion of the left M1 segment with reconstitution of flow at the M2 segments with good collateral flow throughout the remainder of the MCA distribution. 2. No evidence of left MCA territory infarct at the time of imaging. Aspects is 10. 3. The CTA images are moderately motion degraded. There is no definite high-grade stenosis in the neck. 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 4. 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 Other foci of hypodensity in the supratentorial white matter likely reflects sequela of chronic white matter microangiopathy. 5. 1.1 cm soft tissue nodule in the medial right apex. Consider one of the following in 3 months for both low-risk and high-risk individuals: (a) repeat chest CT, (b) follow-up PET-CT, or (c) tissue sampling. This recommendation follows the consensus statement: Guidelines for Management of Incidental Pulmonary Nodules Detected on CT Images: From the Fleischner Society 2017; Radiology 2017; 284:228-243. The acute results were called by telephone at the time of interpretation on 05/25/2021 at 3:05 pm to provider Dr Cheral Marker , who verbally acknowledged these results. Electronically Signed   By: Valetta Mole M.D.   On: 05/25/2021 15:22    Assessment: 70 year old female presenting with acute left M1 ischemic infarction.  1. Exam findings are most consistent with an acute, large territory left MCA ischemic infarction.  2. CT head negative for acute hemorrhage. No evidence of left MCA territory  infarct at the time of imaging.  Aspects is 10. 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 Other foci of hypodensity in the supratentorial white matter likely reflects sequela of chronic white matter microangiopathy.  3. CTA of head: Occlusion of the left M1 segment with reconstitution of flow at the M2 segments with good collateral flow throughout the remainder of the MCA distribution. There is multifocal moderate stenosis of the left P2 segmen. 4. CTA of neck: There is no definite high-grade stenosis in the neck.  5. After comprehensive review of possible contraindications, she has no absolute contraindications to TNK administration. Patient is an IV thrombolysis candidate. Discussed extensively the risks/benefits of IV thrombolysis treatment vs. no treatment with the patient's husband, including risks of hemorrhage and death with IV thrombolysis administration versus worse overall outcomes on average in patients within thrombolysis time window who are not administered TNK. The patient's aphasia precludes meaningful medical decision making on her part at this time. Overall benefits of TNK regarding long-term prognosis are felt to outweigh risks. The patient's husband expressed understanding and wish to proceed with TNK.  6. The patient is a VIR candidate. Risks/benefits of the procedure were discussed extensively with patient's husband, including approximately 50% chance of significant improvement relative to an approximate 10% chance of subarachnoid hemorrhage with possibility of significant worsening including death. The patient's husband expressed understanding and provided informed consent to proceed with VIR. All questions answered. Consent form signed.  7. Acute respiratory failure. Being intubated in the [facility] ED prior to transfer.    Recommendations: 1. Being transferred emergently to [facility] for thrombectomy. Discussed with Dr. Margarita Sermons 2. I have also discussed the patient with Dr. Theda Sers, who will be admitting the  patient to the neuro ICU after thrombectomy.  3. Post TNK and thrombectomy orders to be entered by admitting team at [facility] after her arrival.  4. Full stroke work up at [facility].    50 minutes spent in the emergent neurological evaluation and management of this critically ill patient.   Electronically signed: Dr. Kerney Elbe 05/25/2021, 3:35 PM

## 2021-05-25 NOTE — Code Documentation (Signed)
Pt arrived [facility] via [facility]. She was taken directly to IR suite. Pt unresponsive on propofol. Pt was intubated at [facility] after becoming unresponsive while visiting [facility]. CT/CTA showed L MCA M1 occlusion. TNK given at 1517. Pt has been Covid swabbed at [facility].  Written consent handed to IR staff. Handoff with Harrington Challenger RN.

## 2021-05-26 ENCOUNTER — Encounter (HOSPITAL_COMMUNITY): Payer: Medicare PPO

## 2021-05-26 ENCOUNTER — Inpatient Hospital Stay (HOSPITAL_COMMUNITY): Payer: Medicare PPO

## 2021-05-26 ENCOUNTER — Encounter (HOSPITAL_COMMUNITY): Payer: Self-pay | Admitting: Interventional Radiology

## 2021-05-26 DIAGNOSIS — I4891 Unspecified atrial fibrillation: Secondary | ICD-10-CM | POA: Diagnosis not present

## 2021-05-26 DIAGNOSIS — I4819 Other persistent atrial fibrillation: Secondary | ICD-10-CM | POA: Diagnosis not present

## 2021-05-26 DIAGNOSIS — I63312 Cerebral infarction due to thrombosis of left middle cerebral artery: Secondary | ICD-10-CM | POA: Diagnosis not present

## 2021-05-26 DIAGNOSIS — R0689 Other abnormalities of breathing: Secondary | ICD-10-CM | POA: Diagnosis not present

## 2021-05-26 DIAGNOSIS — I6389 Other cerebral infarction: Secondary | ICD-10-CM | POA: Diagnosis not present

## 2021-05-26 LAB — ECHOCARDIOGRAM COMPLETE
Area-P 1/2: 3.34 cm2
Calc EF: 76.3 %
Height: 64 in
S' Lateral: 3.5 cm
Single Plane A2C EF: 73.8 %
Single Plane A4C EF: 77.8 %
Weight: 3386.27 oz

## 2021-05-26 LAB — COMPREHENSIVE METABOLIC PANEL
ALT: 15 U/L (ref 0–44)
AST: 18 U/L (ref 15–41)
Albumin: 3.4 g/dL — ABNORMAL LOW (ref 3.5–5.0)
Alkaline Phosphatase: 47 U/L (ref 38–126)
Anion gap: 11 (ref 5–15)
BUN: 14 mg/dL (ref 8–23)
CO2: 23 mmol/L (ref 22–32)
Calcium: 8.9 mg/dL (ref 8.9–10.3)
Chloride: 103 mmol/L (ref 98–111)
Creatinine, Ser: 0.78 mg/dL (ref 0.44–1.00)
GFR, Estimated: >60 mL/min (ref >60)
Glucose, Bld: 146 mg/dL — ABNORMAL HIGH (ref 70–99)
Potassium: 3.9 mmol/L (ref 3.5–5.1)
Sodium: 137 mmol/L (ref 135–145)
Total Bilirubin: 0.6 mg/dL (ref 0.3–1.2)
Total Protein: 6.1 g/dL — ABNORMAL LOW (ref 6.5–8.1)

## 2021-05-26 LAB — CBC
HCT: 38.6 % (ref 36.0–46.0)
Hemoglobin: 13.1 g/dL (ref 12.0–15.0)
MCH: 31.7 pg (ref 26.0–34.0)
MCHC: 33.9 g/dL (ref 30.0–36.0)
MCV: 93.5 fL (ref 80.0–100.0)
Platelets: 206 10*3/uL (ref 150–400)
RBC: 4.13 MIL/uL (ref 3.87–5.11)
RDW: 12.4 % (ref 11.5–15.5)
WBC: 11 10*3/uL — ABNORMAL HIGH (ref 4.0–10.5)
nRBC: 0 % (ref 0.0–0.2)

## 2021-05-26 LAB — HEMOGLOBIN A1C
Hgb A1c MFr Bld: 5.8 % — ABNORMAL HIGH (ref 4.8–5.6)
Mean Plasma Glucose: 119.76 mg/dL

## 2021-05-26 LAB — LIPID PANEL
Cholesterol: 171 mg/dL (ref 0–200)
HDL: 49 mg/dL (ref >40)
LDL Cholesterol: 109 mg/dL — ABNORMAL HIGH (ref 0–99)
Total CHOL/HDL Ratio: 3.5 RATIO
Triglycerides: 64 mg/dL (ref <150)
VLDL: 13 mg/dL (ref 0–40)

## 2021-05-26 LAB — GLUCOSE, CAPILLARY
Glucose-Capillary: 135 mg/dL — ABNORMAL HIGH (ref 70–99)
Glucose-Capillary: 155 mg/dL — ABNORMAL HIGH (ref 70–99)

## 2021-05-26 LAB — TRIGLYCERIDES: Triglycerides: 66 mg/dL (ref <150)

## 2021-05-26 LAB — TSH: TSH: 4.101 u[IU]/mL (ref 0.350–4.500)

## 2021-05-26 IMAGING — MR MR HEAD W/O CM
6 of 11 series · 24 of 48 positions shown · non-contrast
Comparison: MR angiography same day. CT studies and intervention
done yesterday.

CLINICAL DATA: Follow-up stroke.

EXAM:
MRI HEAD WITHOUT CONTRAST
TECHNIQUE: Multiplanar, multiecho pulse sequences of the brain and surrounding
structures were obtained without intravenous contrast.

[Series 2: DWI · axial · 3.0mm · 0.94mm/px · z∈[-53,+103]mm · 7 of 105 slices shown (1 of 2)]
[im 1/105]
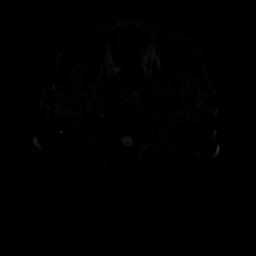
[im 18/105]
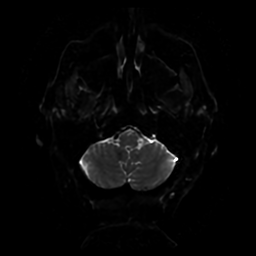
[im 35/105]
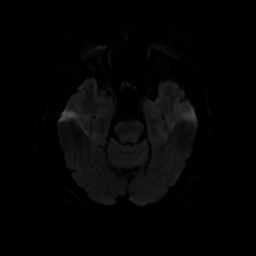
[im 53/105]
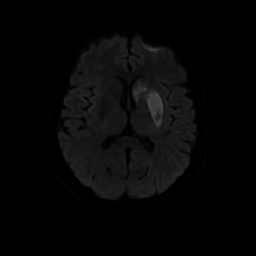
[im 70/105]
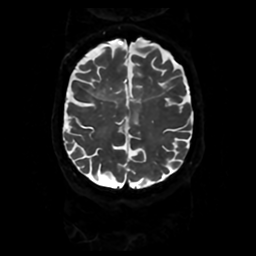
[im 87/105]
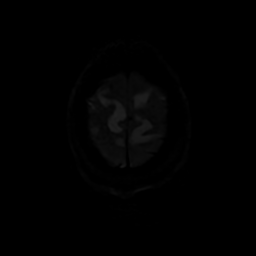
[im 105/105]
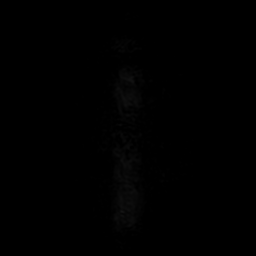

[Series 3: DWI · coronal · 4.0mm · 0.94mm/px · 5 of 74 slices shown (2 of 2)]
[im 1/74]
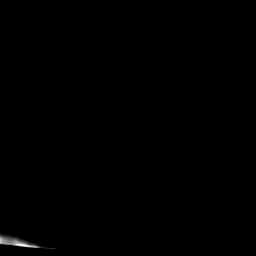
[im 19/74]
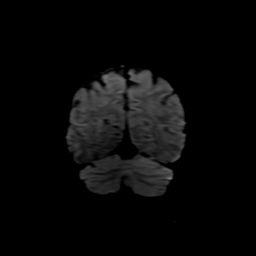
[im 37/74]
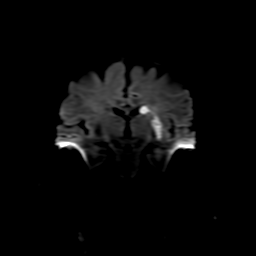
[im 55/74]
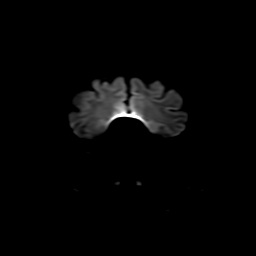
[im 74/74]
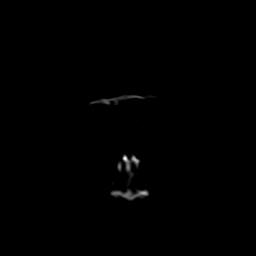

[Series 4: FLAIR · sagittal · 5.0mm · 0.23mm/px · 2 of 27 slices shown (1 of 2)]
[im 1/27]
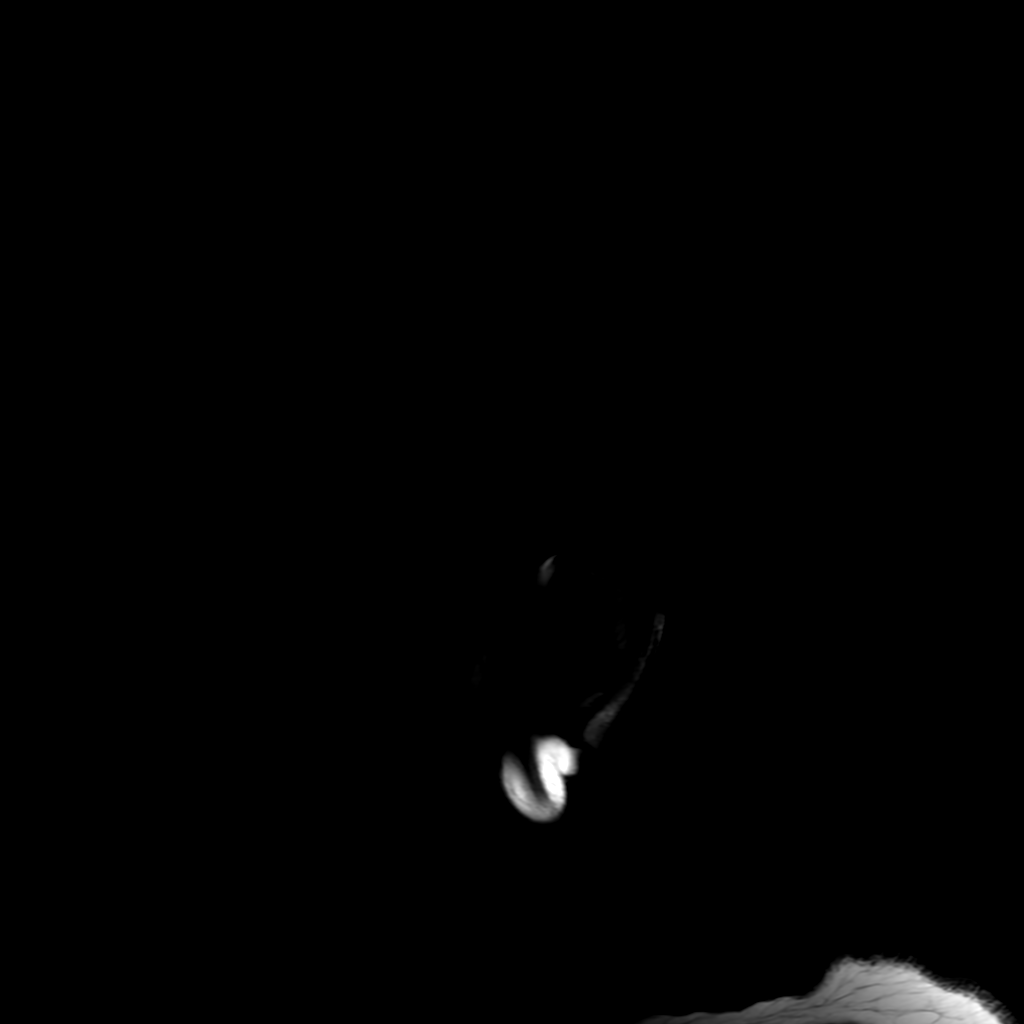
[im 27/27]
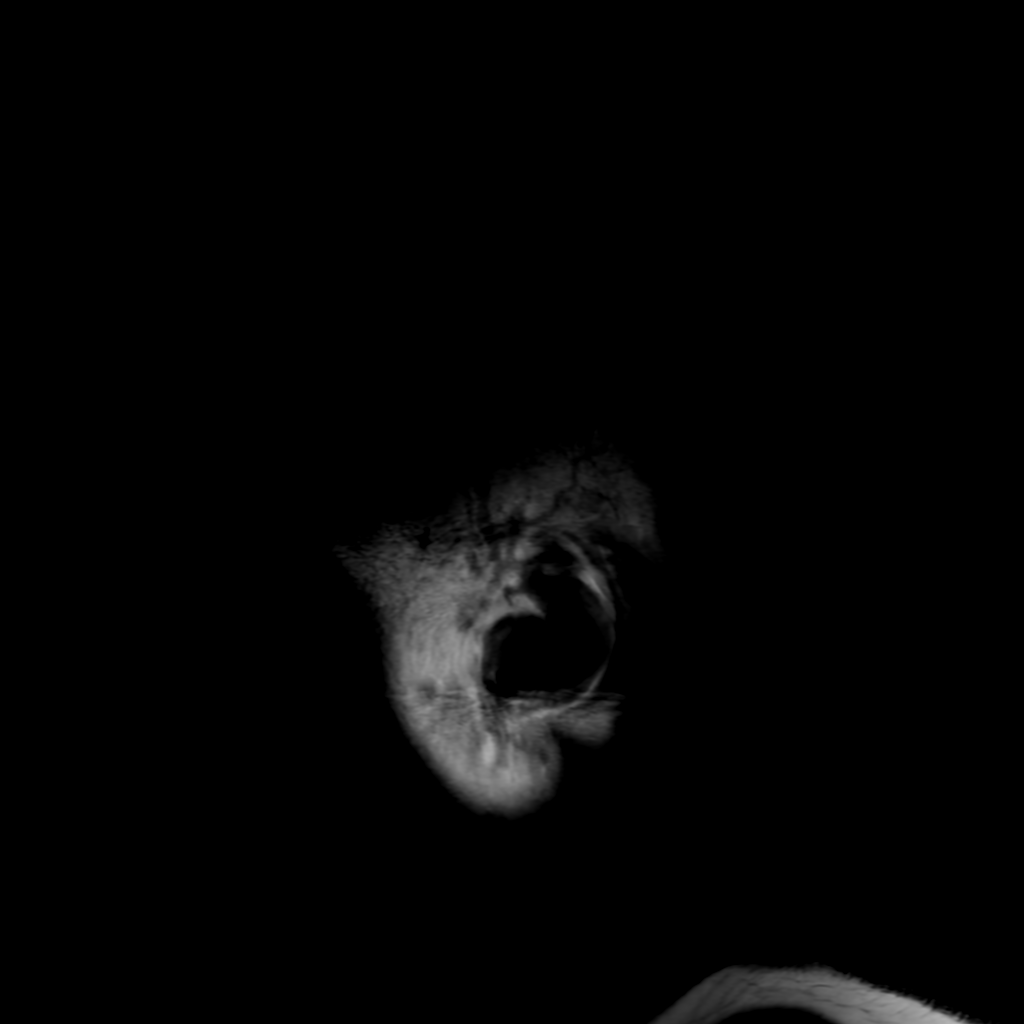

[Series 6: FLAIR · axial · 4.0mm · 0.45mm/px · z∈[-61,+101]mm · 3 of 38 slices shown (2 of 2)]
[im 1/38]
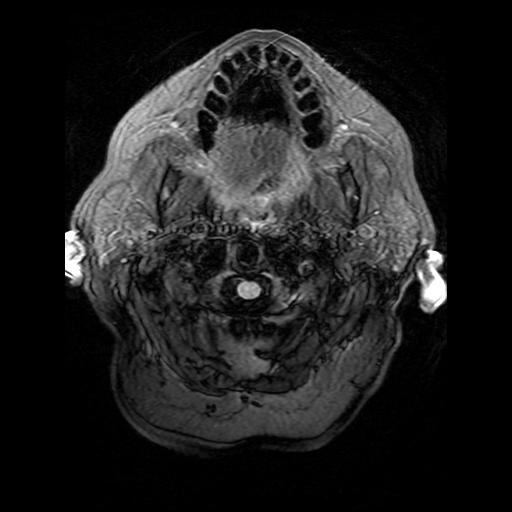
[im 19/38]
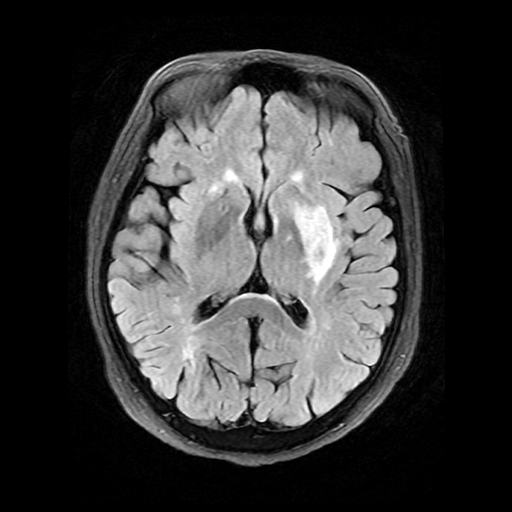
[im 38/38]
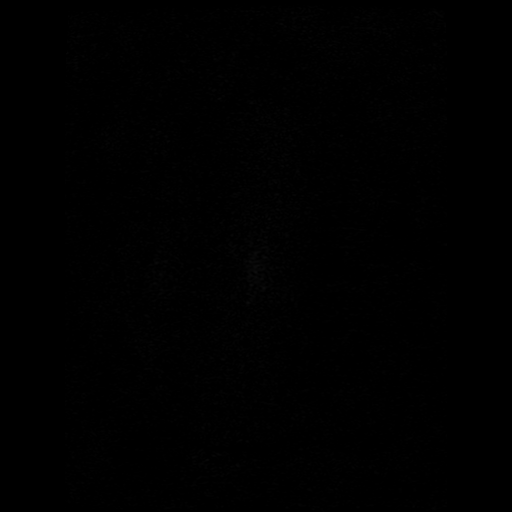

[Series 250: ADC · axial · 3.0mm · 0.94mm/px · z∈[-53,+103]mm · 4 of 53 slices shown (1 of 2)]
[im 1/53]
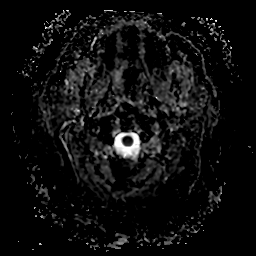
[im 18/53]
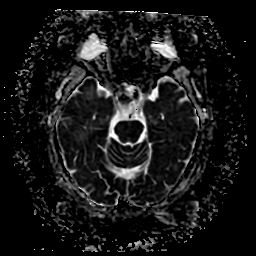
[im 35/53]
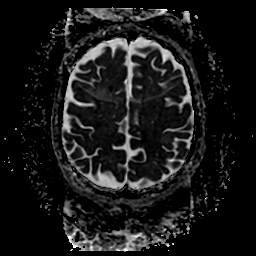
[im 53/53]
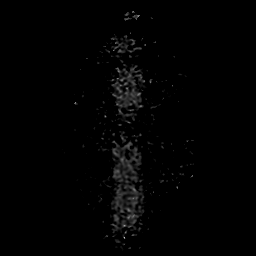

[Series 350: ADC · coronal · 4.0mm · 0.94mm/px · 3 of 37 slices shown (2 of 2)]
[im 1/37]
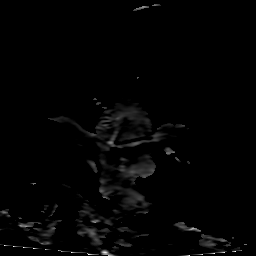
[im 19/37]
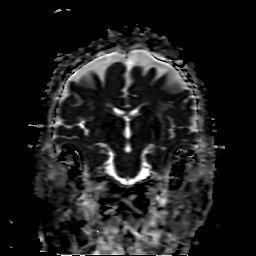
[im 37/37]
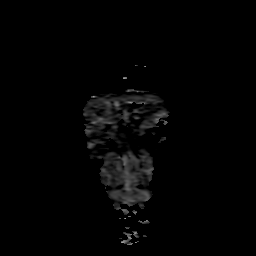

[24 of 48 positions shown; findings below may reference images not displayed]

FINDINGS: Brain: There is acute infarction affecting the corpus stratum on the
left. Minimal punctate infarction in the left posterior frontal
subcortical white matter. Swelling of the putamen and caudate, with
minimal petechial blood products but without frank hemorrhage. Three
punctate foci of acute infarction in the right medial frontal lobe,
possibly small embolic infarctions in the right anterior cerebral
artery territory. No midline shift. Elsewhere, there is old cortical
and subcortical infarction in the right parietal lobe and moderate
chronic small-vessel ischemic changes of the cerebral hemispheric
white matter. No hydrocephalus. No extra-axial collection.

Vascular: Major vessels at the base of the brain show flow.

Skull and upper cervical spine: Negative

Sinuses/Orbits: Clear/normal

Other: None
IMPRESSION: Acute infarction of the left putamen and caudate. Minimal petechial
blood products but no frank hematoma. Mild swelling but no midline
shift.

Three punctate acute infarctions within the medial right frontal
lobe, probably micro embolic infarctions in the anterior cerebral
artery territory.

Old infarction in the right parietal cortical and subcortical brain
and seen affecting the cerebral hemispheric deep white matter
extensively.

## 2021-05-26 IMAGING — DX DG CHEST 1V PORT
1 series · 1 of 1 positions shown · non-contrast
Comparison: [DATE]

CLINICAL DATA: Status post intubation.

EXAM:
PORTABLE CHEST 1 VIEW

[chest]
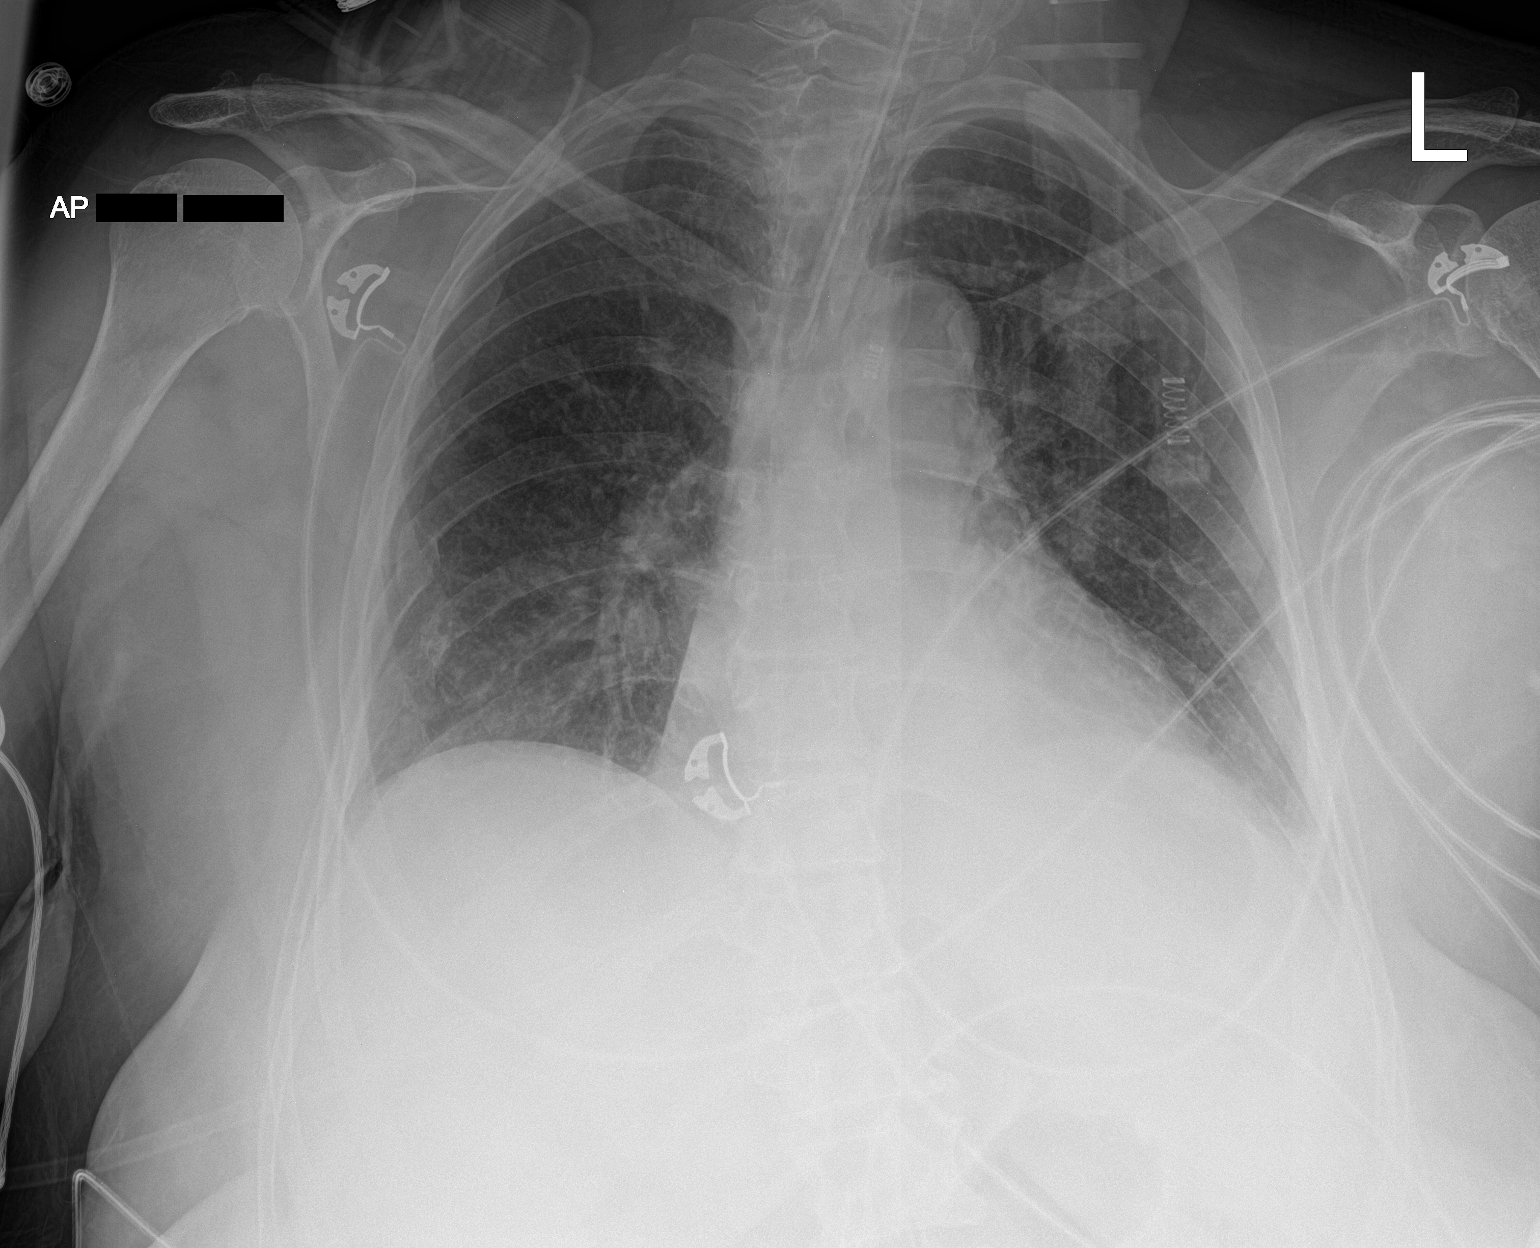

[1 of 1 positions shown; findings below may reference images not displayed]

FINDINGS: The ETT tip is 1.2 cm above the carina. Normal heart size. Interval
resolution of previous interstitial and airspace opacities. Remote
appearing right posterior rib fractures.
IMPRESSION: 1. Stable position of ET tube with tip above the carina.
2. Interval clearing of interstitial edema.

## 2021-05-26 IMAGING — CT CT ABD-PEL WO/W CM
2 of 9 series · 12 of 46 positions shown, 18 images · IV contrast (Omni 300)
Comparison: None.

CLINICAL DATA: Abdominal pain.

EXAM:
CT ABDOMEN AND PELVIS WITHOUT AND WITH CONTRAST
TECHNIQUE: Multidetector CT imaging of the abdomen and pelvis was performed
following the standard protocol before and following the bolus
administration of intravenous contrast.
CONTRAST:  75mL OMNIPAQUE IOHEXOL 350 MG/ML SOLN

[Series 6: a/p w/o cor · coronal · non-contrast · 0.90mm/px · 3 of 147 slices shown]
[im 37/147  soft-tissue]
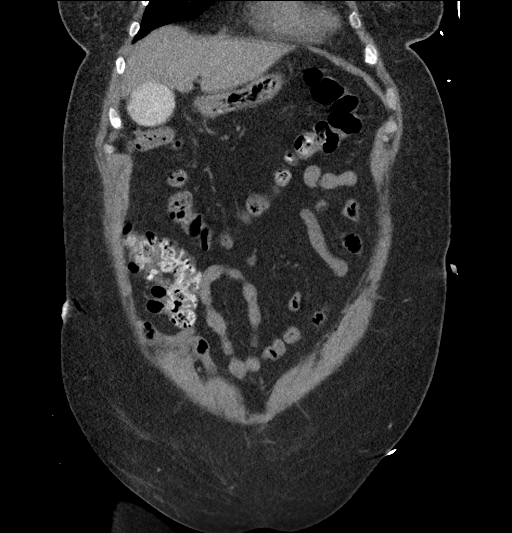
[im 74/147  soft-tissue]
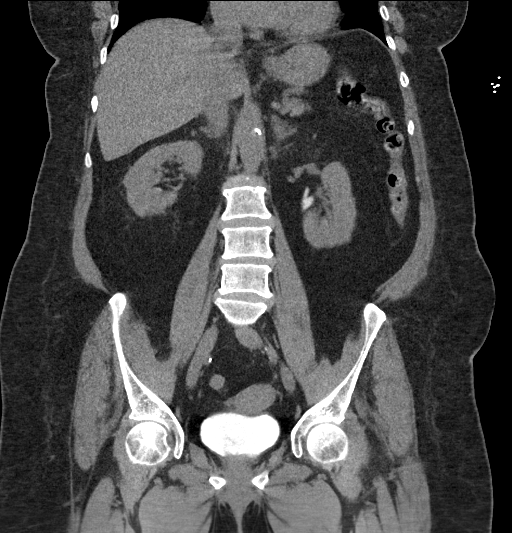
[im 110/147  soft-tissue]
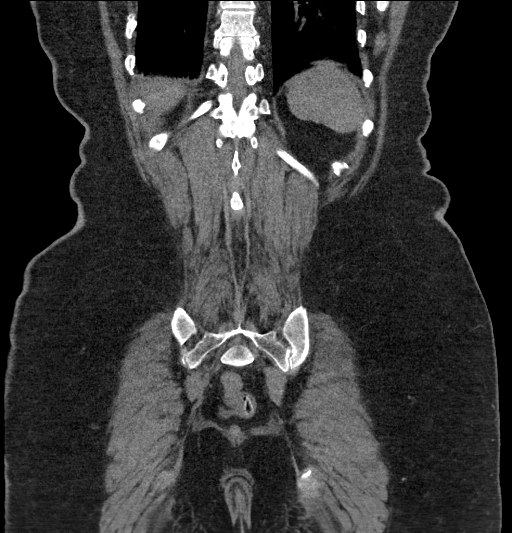

[Series 9: a/p w/ 5mm · axial · 0.97mm/px · z∈[-312,+78]mm · 9 of 96 slices shown, 15 images]
[im 9/96  soft-tissue]
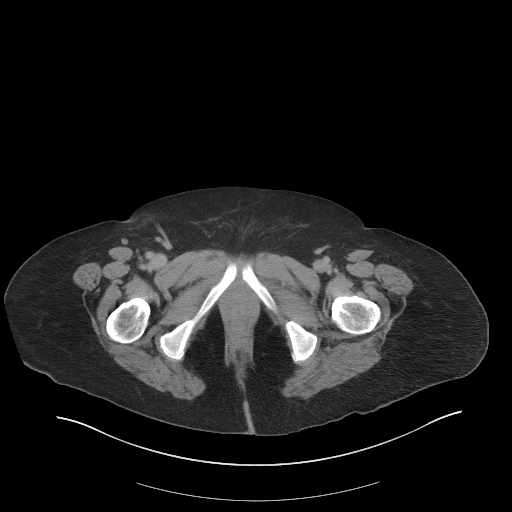
[im 9/96  bone]
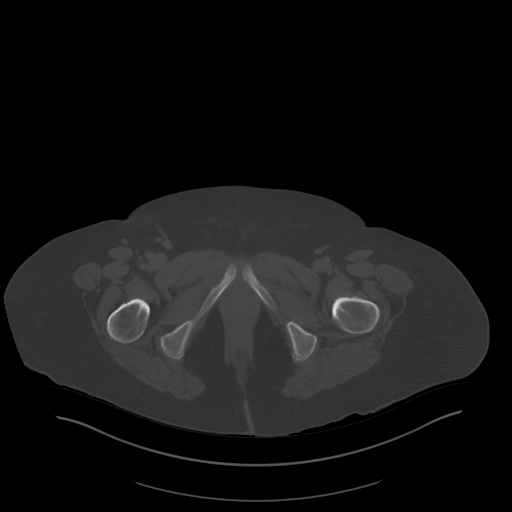
[im 18/96  soft-tissue]
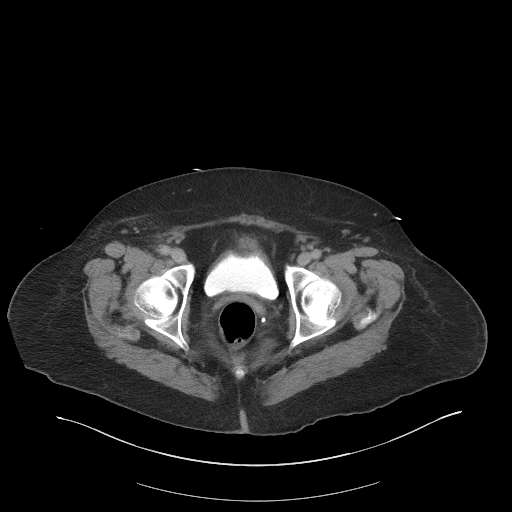
[im 26/96  soft-tissue]
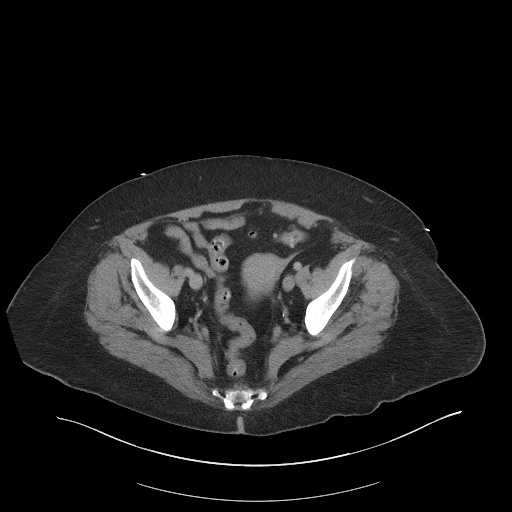
[im 35/96  soft-tissue]
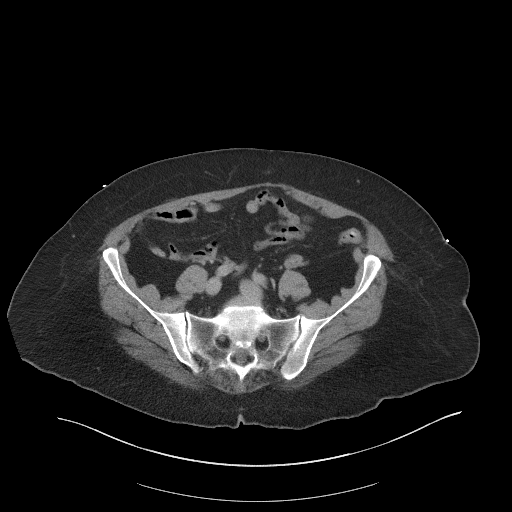
[im 52/96  soft-tissue]
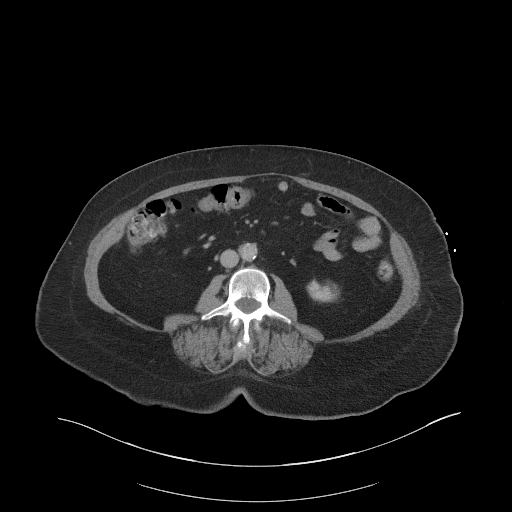
[im 61/96  soft-tissue]
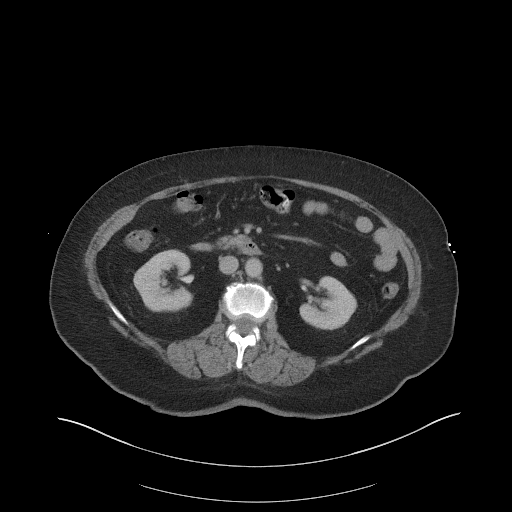
[im 61/96  lung]
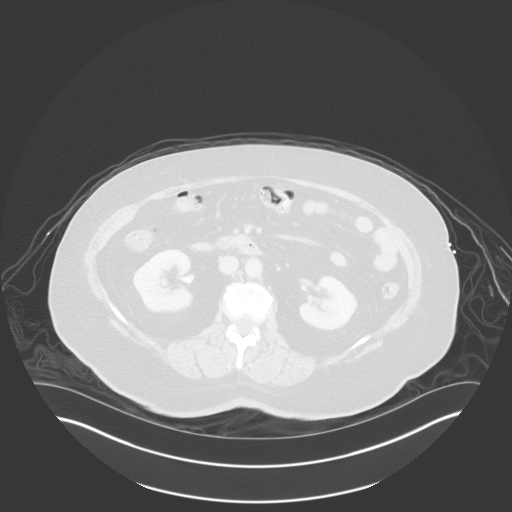
[im 70/96  soft-tissue]
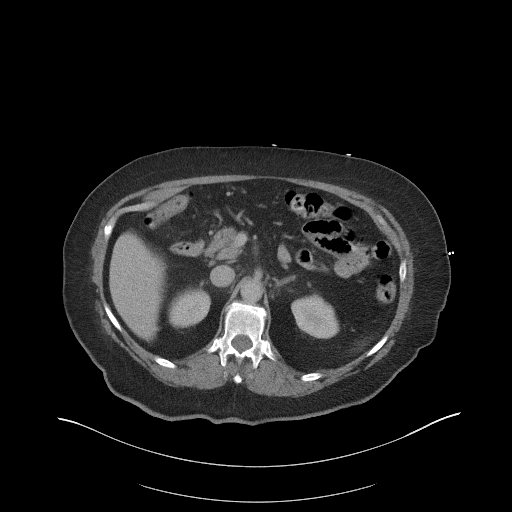
[im 70/96  lung]
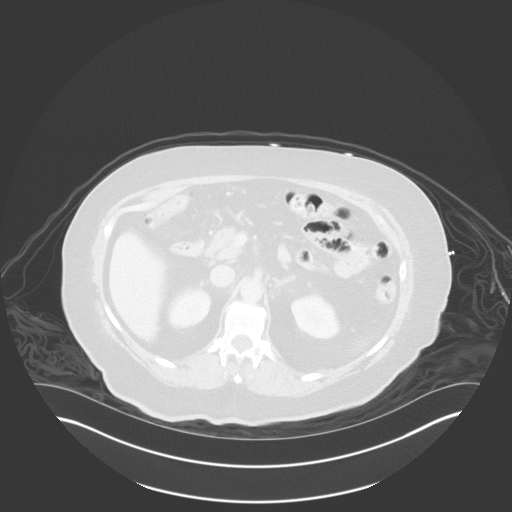
[im 78/96  soft-tissue]
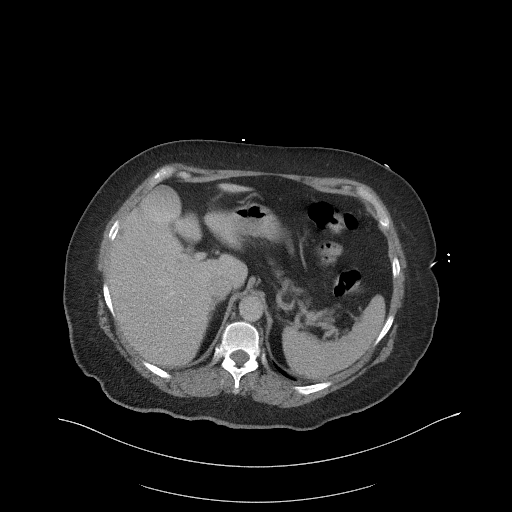
[im 78/96  lung]
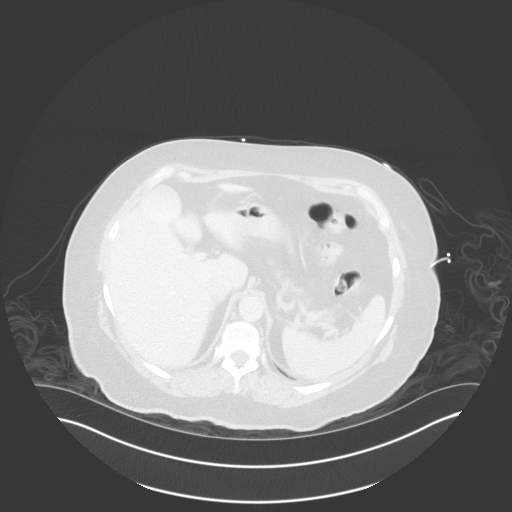
[im 87/96  soft-tissue]
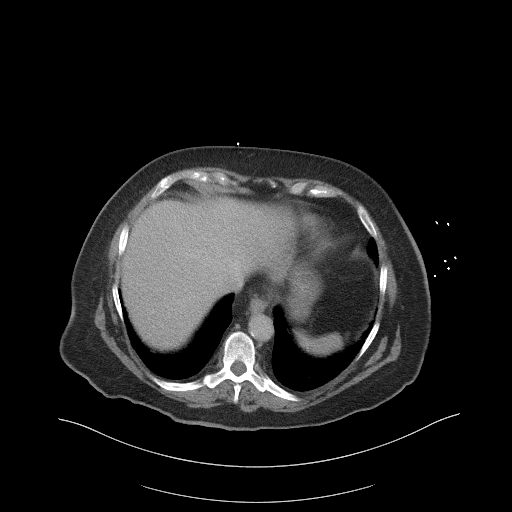
[im 87/96  lung]
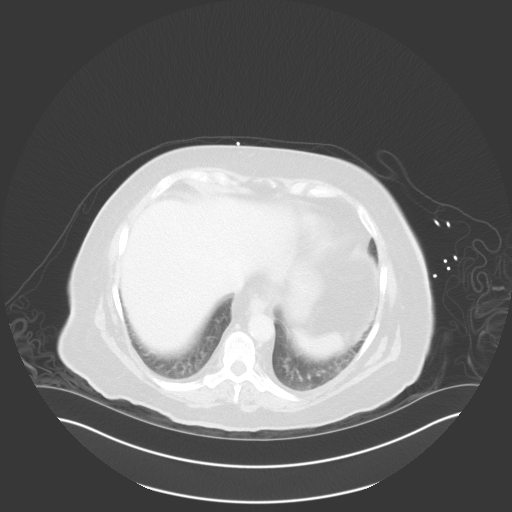
[im 87/96  bone]
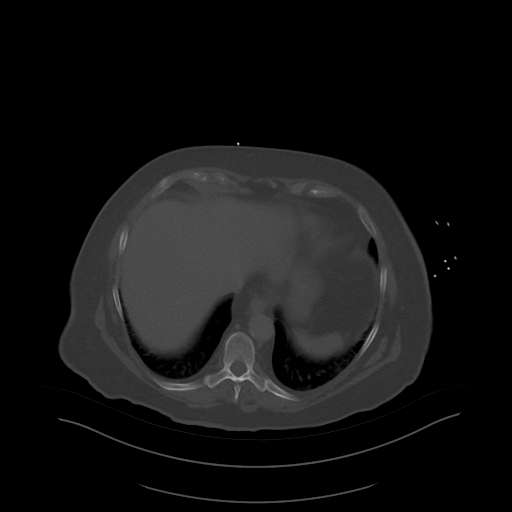

[12 of 46 positions shown; findings below may reference images not displayed]

FINDINGS: Lower chest: The lung bases are clear of an acute process. No
pleural effusions or pulmonary lesions. The heart is within normal
limits in size. No pericardial effusion.

Hepatobiliary: No hepatic lesions or intrahepatic biliary
dilatation. High attenuation material in the gallbladder likely due
to vicarious excretion of contrast material from prior CT scan and
interventional procedure. There is a 2.4 cm gallstone noted the
gallbladder but no findings suspicious for acute cholecystitis. No
common bile duct dilatation.

Pancreas: No mass, inflammation or ductal dilatation.

Spleen: Normal size.  No focal lesions.

Adrenals/Urinary Tract: Adrenal glands and kidneys are unremarkable.
There is some contrast in both collecting systems. The bladder also
contains contrast material. No bladder mass or asymmetric bladder
wall thickening.

Stomach/Bowel: The stomach, duodenum, small bowel and colon are
unremarkable. No acute inflammatory changes, mass lesions or
obstructive findings. The terminal ileum is normal. The appendix is
normal. Scattered colonic diverticulosis but no findings for acute
diverticulitis.

Vascular/Lymphatic: Scattered atherosclerotic calcifications but no
aneurysm or dissection. The major venous structures are patent. No
mesenteric or retroperitoneal mass or adenopathy.

Reproductive: The uterus and ovaries are unremarkable.

Other: No pelvic mass or adenopathy. No free pelvic fluid
collections. No inguinal mass or adenopathy. Small periumbilical
abdominal wall hernia containing fat.

Musculoskeletal: No significant bony findings.
IMPRESSION: 1. No acute abdominal/pelvic findings, mass lesions or adenopathy.
2. Cholelithiasis without findings suspicious for acute
cholecystitis.
3. Scattered colonic diverticulosis but no findings for acute
diverticulitis.

## 2021-05-26 IMAGING — MR MR MRA HEAD W/O CM
1 series · 20 of 48 positions shown · non-contrast
Comparison: MRI earlier same day

CLINICAL DATA: Follow-up stroke. Left-sided weakness. Left MCA
occlusion with intervention.

EXAM:
MRA HEAD WITHOUT CONTRAST
TECHNIQUE: Angiographic images of the Circle of Willis were acquired using MRA
technique without intravenous contrast.

[Series 2: ax (id) · axial · 1.0mm · 0.43mm/px · z∈[-45,+38]mm · 20 of 176 slices shown]
[im 1/176]
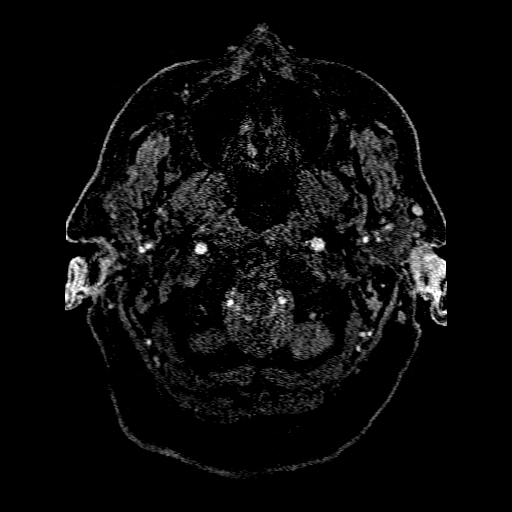
[im 4/176]
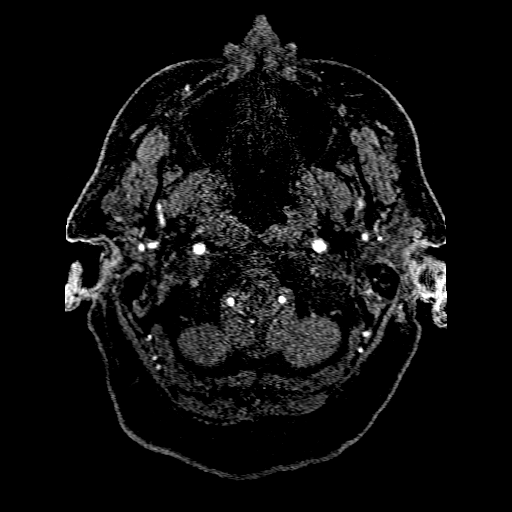
[im 8/176]
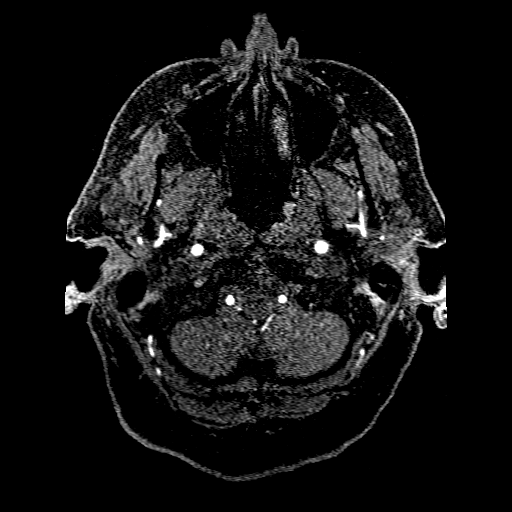
[im 12/176]
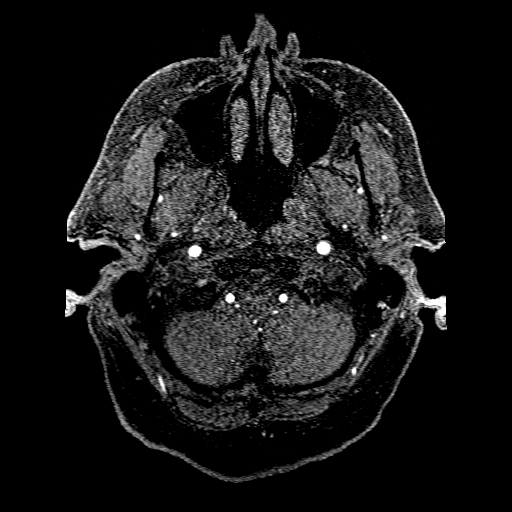
[im 15/176]
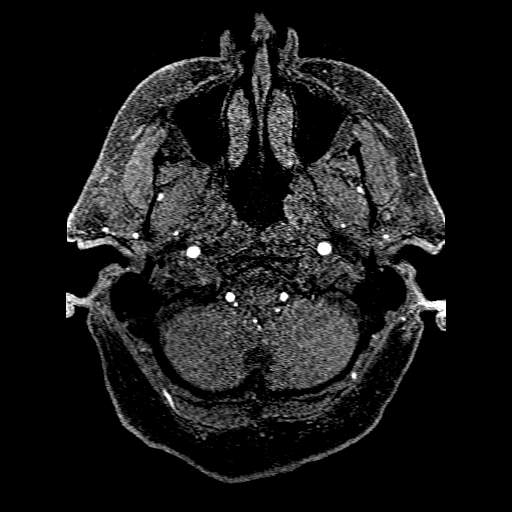
[im 19/176]
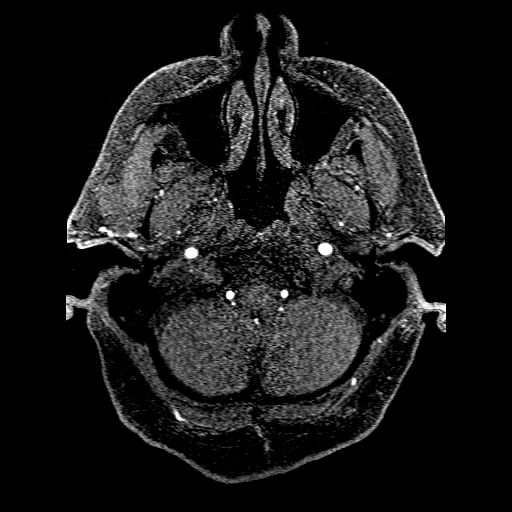
[im 23/176]
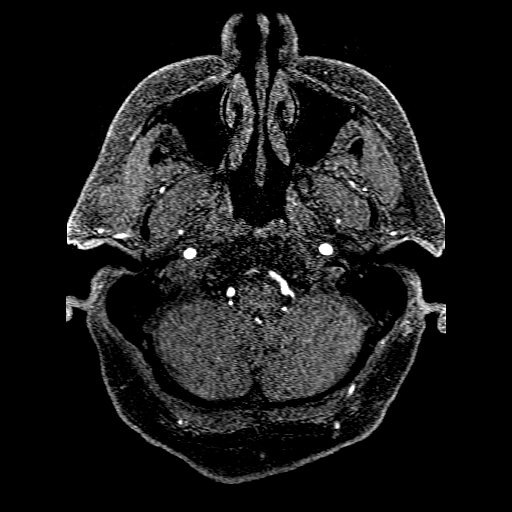
[im 27/176]
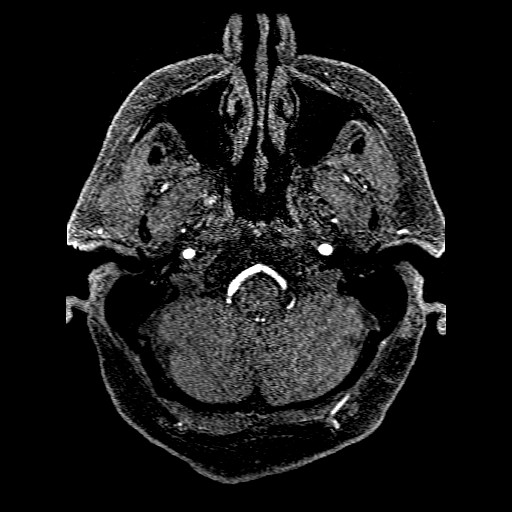
[im 30/176]
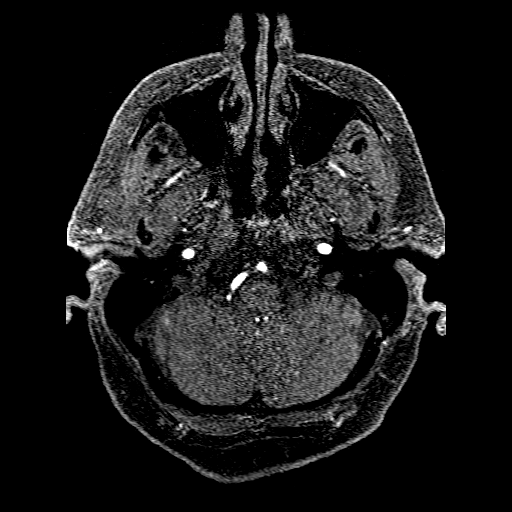
[im 34/176]
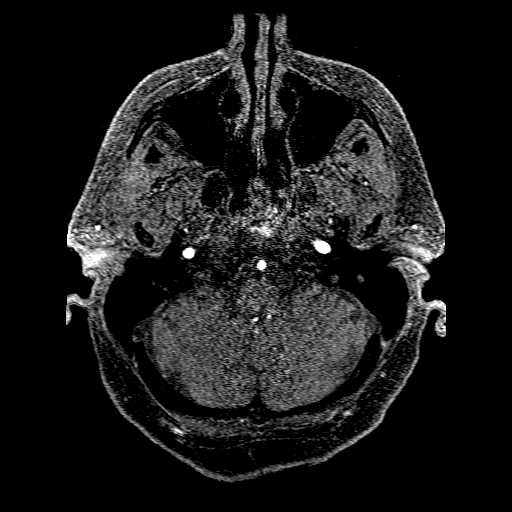
[im 38/176]
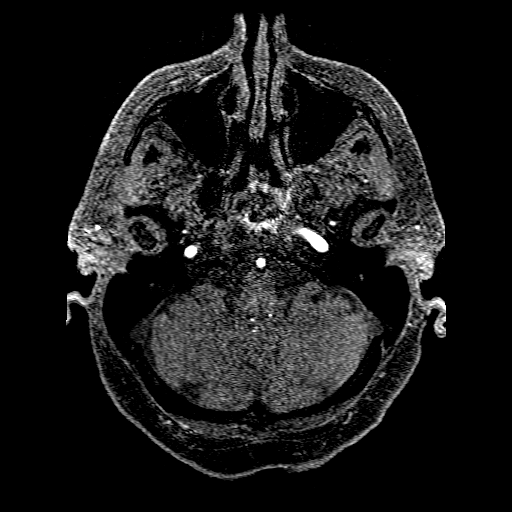
[im 41/176]
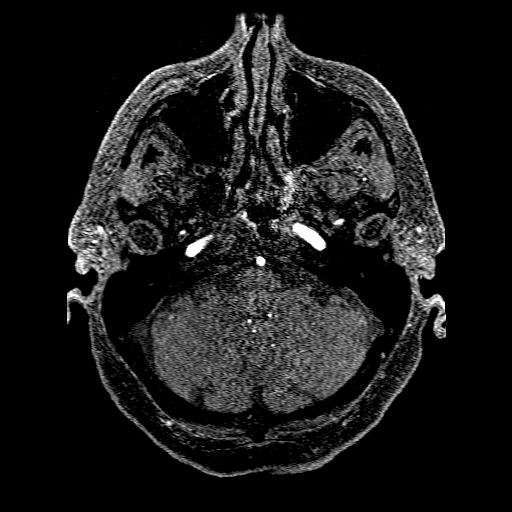
[im 56/176]
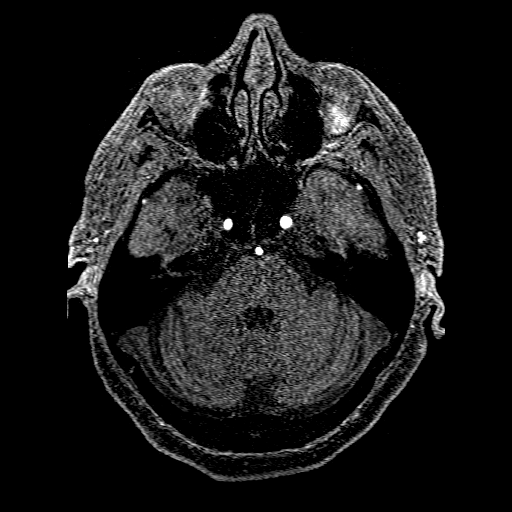
[im 79/176]
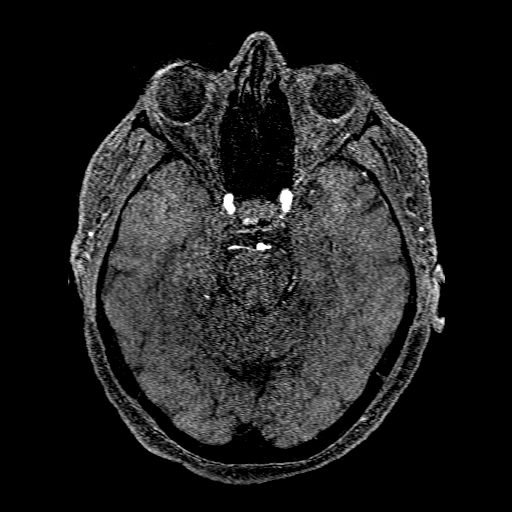
[im 90/176]
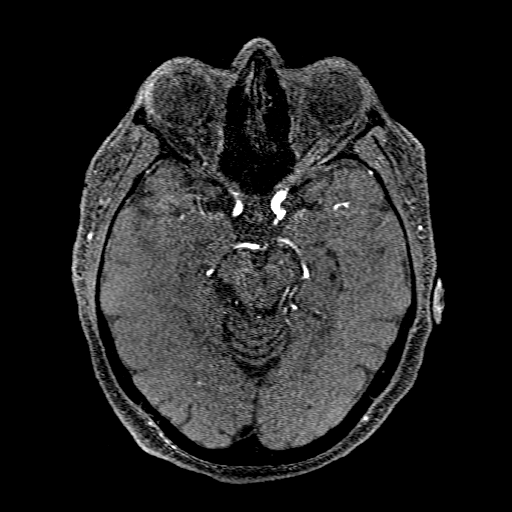
[im 101/176]
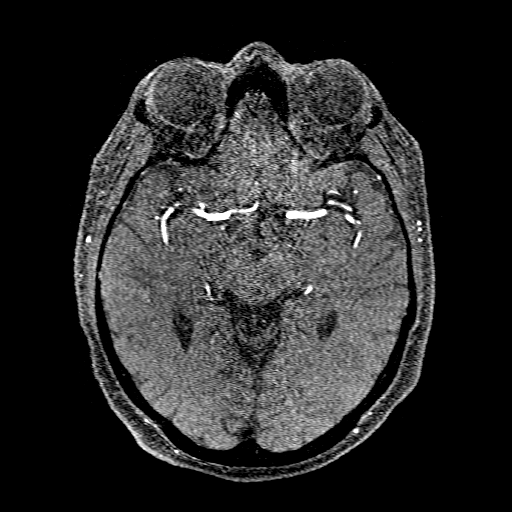
[im 123/176]
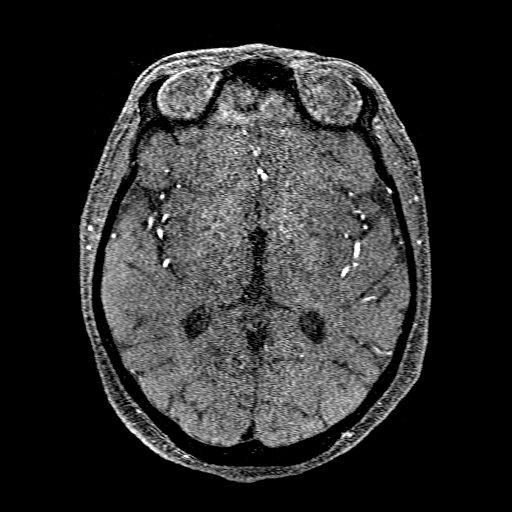
[im 146/176]
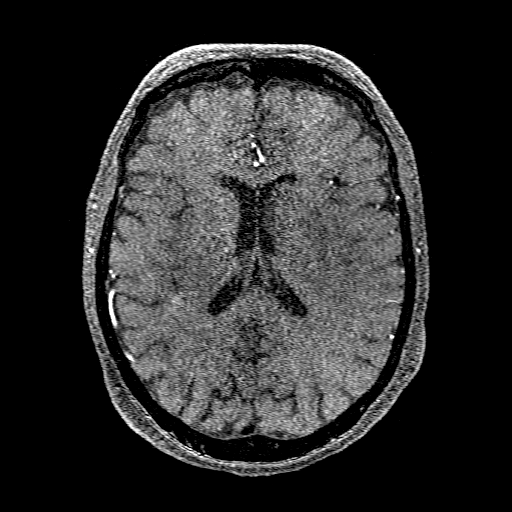
[im 149/176]
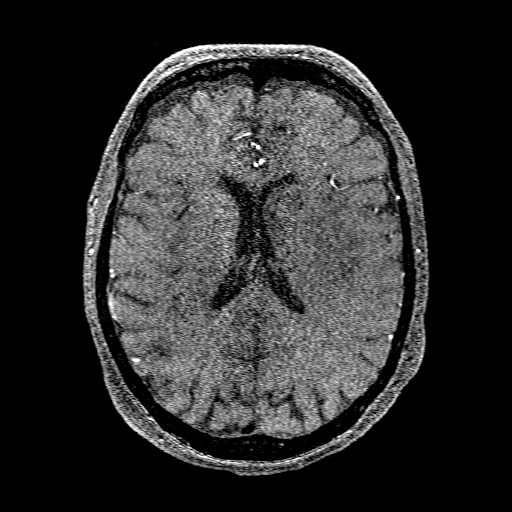
[im 168/176]
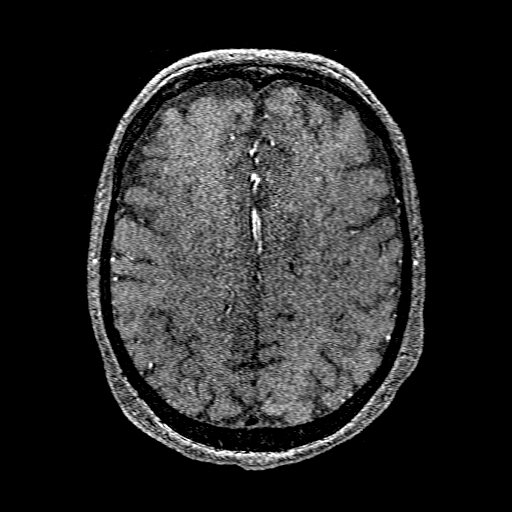

[20 of 48 positions shown; findings below may reference images not displayed]

FINDINGS: Anterior circulation: Both internal carotid arteries are widely
patent through the skull base and siphon regions. The anterior and
middle cerebral vessels are patent. Reconstituted flow in the left
MCA without proximal stenosis or missing distal branch vessels.

Posterior circulation: Both vertebral arteries widely patent to the
basilar. No basilar stenosis. Stenoses in the PCA P2 segments, worse
on the left than the right.

Anatomic variants: None significant.

Other: None.
IMPRESSION: Good appearance of the anterior circulation flow presently. Status
post mechanical thrombectomy on the left. Wide patency of the left
MCA branches presently.

Stenotic disease of both PCA P2 segments, left worse than right.

## 2021-05-26 MED ORDER — ASPIRIN EC 325 MG PO TBEC
325.0000 mg | DELAYED_RELEASE_TABLET | Freq: Every day | ORAL | Status: DC
  Administered 2021-05-27 – 2021-05-29 (×3): 325 mg via ORAL
  Filled 2021-05-26 (×3): qty 1

## 2021-05-26 MED ORDER — ASPIRIN 300 MG RE SUPP
300.0000 mg | Freq: Every day | RECTAL | Status: DC
  Administered 2021-05-26: 300 mg via RECTAL
  Filled 2021-05-26: qty 1

## 2021-05-26 MED ORDER — FENTANYL CITRATE PF 50 MCG/ML IJ SOSY
PREFILLED_SYRINGE | INTRAMUSCULAR | Status: AC
  Filled 2021-05-26: qty 1

## 2021-05-26 MED ORDER — CHLORHEXIDINE GLUCONATE 0.12% ORAL RINSE (MEDLINE KIT)
15.0000 mL | Freq: Two times a day (BID) | OROMUCOSAL | Status: DC
  Administered 2021-05-26 (×3): 15 mL via OROMUCOSAL

## 2021-05-26 MED ORDER — ORAL CARE MOUTH RINSE
15.0000 mL | OROMUCOSAL | Status: DC
  Administered 2021-05-26 (×6): 15 mL via OROMUCOSAL

## 2021-05-26 MED ORDER — FENTANYL CITRATE PF 50 MCG/ML IJ SOSY
25.0000 ug | PREFILLED_SYRINGE | Freq: Once | INTRAMUSCULAR | Status: AC
  Administered 2021-05-26: 25 ug via INTRAVENOUS

## 2021-05-26 MED ORDER — IOHEXOL 350 MG/ML SOLN
75.0000 mL | Freq: Once | INTRAVENOUS | Status: AC | PRN
  Administered 2021-05-26: 75 mL via INTRAVENOUS

## 2021-05-26 MED ORDER — METOCLOPRAMIDE HCL 5 MG/ML IJ SOLN
5.0000 mg | Freq: Four times a day (QID) | INTRAMUSCULAR | Status: DC
  Administered 2021-05-26: 5 mg via INTRAVENOUS
  Filled 2021-05-26: qty 2

## 2021-05-26 MED ORDER — CHLORHEXIDINE GLUCONATE CLOTH 2 % EX PADS
6.0000 | MEDICATED_PAD | Freq: Every day | CUTANEOUS | Status: DC
  Administered 2021-05-26 – 2021-05-27 (×2): 6 via TOPICAL

## 2021-05-26 MED ORDER — ALBUMIN HUMAN 5 % IV SOLN
12.5000 g | Freq: Four times a day (QID) | INTRAVENOUS | Status: DC
  Administered 2021-05-26: 12.5 g via INTRAVENOUS
  Filled 2021-05-26 (×2): qty 250

## 2021-05-26 MED ORDER — ORAL CARE MOUTH RINSE
15.0000 mL | Freq: Two times a day (BID) | OROMUCOSAL | Status: DC
  Administered 2021-05-26 – 2021-05-27 (×2): 15 mL via OROMUCOSAL

## 2021-05-26 MED ORDER — ATORVASTATIN CALCIUM 40 MG PO TABS
40.0000 mg | ORAL_TABLET | Freq: Every day | ORAL | Status: DC
  Administered 2021-05-27 – 2021-06-01 (×6): 40 mg via ORAL
  Filled 2021-05-26 (×7): qty 1

## 2021-05-26 MED ORDER — PERFLUTREN LIPID MICROSPHERE
1.0000 mL | INTRAVENOUS | Status: AC | PRN
  Administered 2021-05-26: 2 mL via INTRAVENOUS
  Filled 2021-05-26: qty 10

## 2021-05-26 MED ORDER — PANTOPRAZOLE SODIUM 40 MG IV SOLR
40.0000 mg | INTRAVENOUS | Status: DC
  Administered 2021-05-26: 40 mg via INTRAVENOUS
  Filled 2021-05-26: qty 40

## 2021-05-26 MED ORDER — ONDANSETRON HCL 4 MG/2ML IJ SOLN
INTRAMUSCULAR | Status: AC
  Filled 2021-05-26: qty 2

## 2021-05-26 MED ORDER — SODIUM CHLORIDE 0.9 % IV SOLN: INTRAVENOUS | Status: DC

## 2021-05-26 MED ORDER — ONDANSETRON HCL 4 MG/2ML IJ SOLN
4.0000 mg | Freq: Four times a day (QID) | INTRAMUSCULAR | Status: DC | PRN
  Administered 2021-05-26: 4 mg via INTRAVENOUS
  Filled 2021-05-26: qty 2

## 2021-05-26 NOTE — Evaluation (Signed)
Physical Therapy Evaluation Patient Details Name: Deborah Carey MRN: [id number] DOB: 24-Aug-1951 Today's Date: 05/26/2021  History of Present Illness  70 y.o. female presents to [facility] hospital on 05/25/2021 with R sided weakness, L eye deviation and aphasia. Pt received TKN and was intubated due to worsening mental status. CTA demonstrates L M1/MCA occlusion, pt taken to IR for mechanical thrombectomy with complete revascularization. PMH includes osteoarthritis, anxiety, and cataracts.  Clinical Impression  Pt presents to PT with deficits in strength, power, gait, balance, functional mobility, cognition, communication. Pt demonstrates R sided weakness and impaired coordination, benefiting from physical assistance for functional mobility tasks to reduce falls risk. Ambulation deferred due to soft BP at RN request. Pt also demonstrates deficits in expressive speech and memory. Pt will benefit from aggressive mobilization to improve R sided strength and aide in a return to independent mobility. PT recommends discharge to CIR when medically ready.       Recommendations for follow up therapy are one component of a multi-disciplinary discharge planning process, led by the attending physician.  Recommendations may be updated based on patient status, additional functional criteria and insurance authorization.  Follow Up Recommendations CIR    Equipment Recommendations   (TBD)    Recommendations for Other Services Rehab consult     Precautions / Restrictions Precautions Precautions: Fall Restrictions Weight Bearing Restrictions: No      Mobility  Bed Mobility Overal bed mobility: Needs Assistance Bed Mobility: Supine to Sit;Sit to Supine     Supine to sit: Min assist;HOB elevated Sit to supine: Mod assist   General bed mobility comments: use of bed rails    Transfers Overall transfer level: Needs assistance Equipment used: 1 person hand held assist Transfers: Sit to/from Stand Sit to  Stand: Min assist         General transfer comment: pt holding bed rail for support  Ambulation/Gait Ambulation/Gait assistance:  (deferred 2/2 recent hypotension)              Stairs            Wheelchair Mobility    Modified Rankin (Stroke Patients Only) Modified Rankin (Stroke Patients Only) Pre-Morbid Rankin Score: No symptoms Modified Rankin: Moderately severe disability     Balance Overall balance assessment: Needs assistance Sitting-balance support: No upper extremity supported;Feet supported Sitting balance-Leahy Scale: Fair     Standing balance support: Single extremity supported Standing balance-Leahy Scale: Poor Standing balance comment: pt maintains LUE support of bed rail in standing                             Pertinent Vitals/Pain Pain Assessment: No/denies pain    Home Living Family/patient expects to be discharged to:: Private residence Living Arrangements: Spouse/significant other Available Help at Discharge: Family;Available 24 hours/day Type of Home: House Home Access: Stairs to enter Entrance Stairs-Rails: None Entrance Stairs-Number of Steps: 4 Home Layout: Two level;Able to live on main level with bedroom/bathroom Home Equipment: None      Prior Function Level of Independence: Independent               Hand Dominance        Extremity/Trunk Assessment   Upper Extremity Assessment Upper Extremity Assessment: RUE deficits/detail RUE Deficits / Details: grossly 4-/5 RUE Coordination:  (slow and deliberate thumb to fingers)    Lower Extremity Assessment Lower Extremity Assessment: RLE deficits/detail RLE Deficits / Details: grossly 4-/5    Cervical /  Trunk Assessment Cervical / Trunk Assessment: Normal  Communication   Communication: Expressive difficulties  Cognition Arousal/Alertness: Awake/alert Behavior During Therapy: Flat affect Overall Cognitive Status: Impaired/Different from baseline Area  of Impairment: Orientation;Attention;Memory;Awareness;Problem solving                 Orientation Level: Disoriented to;Person;Time (pt is unable to report birthdate, states she is 48) Current Attention Level: Sustained Memory: Decreased short-term memory Following Commands: Follows one step commands consistently     Problem Solving: Slow processing        General Comments General comments (skin integrity, edema, etc.): BP pre-mobility98/62 (76), sitting 127/65 (81), after sitting 119/72    Exercises     Assessment/Plan    PT Assessment Patient needs continued PT services  PT Problem List Decreased strength;Decreased activity tolerance;Decreased balance;Decreased mobility;Decreased knowledge of use of DME;Decreased coordination       PT Treatment Interventions DME instruction;Gait training;Stair training;Functional mobility training;Therapeutic activities;Therapeutic exercise;Balance training;Neuromuscular re-education;Patient/family education    PT Goals (Current goals can be found in the Care Plan section)  Acute Rehab PT Goals Patient Stated Goal: to return to independent mobility PT Goal Formulation: With patient Time For Goal Achievement: 06/09/21 Potential to Achieve Goals: Good    Frequency Min 4X/week   Barriers to discharge        Co-evaluation               AM-PAC PT "6 Clicks" Mobility  Outcome Measure Help needed turning from your back to your side while in a flat bed without using bedrails?: A Little Help needed moving from lying on your back to sitting on the side of a flat bed without using bedrails?: A Little Help needed moving to and from a bed to a chair (including a wheelchair)?: A Little Help needed standing up from a chair using your arms (e.g., wheelchair or bedside chair)?: A Little Help needed to walk in hospital room?: A Lot Help needed climbing 3-5 steps with a railing? : A Lot 6 Click Score: 16    End of Session   Activity  Tolerance: Patient tolerated treatment well Patient left: in bed;with call bell/phone within reach;with family/visitor present;with nursing/sitter in room Nurse Communication: Mobility status PT Visit Diagnosis: Other abnormalities of gait and mobility (R26.89);Muscle weakness (generalized) (M62.81);Hemiplegia and hemiparesis Hemiplegia - Right/Left: Right Hemiplegia - caused by: Cerebral infarction    Time: 1914-7829 PT Time Calculation (min) (ACUTE ONLY): 30 min   Charges:   PT Evaluation $PT Eval Low Complexity: 1 Low          Zenaida Niece, PT, DPT Acute Rehabilitation Pager: [phone]   Zenaida Niece 05/26/2021, 12:51 PM

## 2021-05-26 NOTE — Anesthesia Postprocedure Evaluation (Signed)
Anesthesia Post Note  Patient: Deborah Carey  Procedure(s) Performed: IR WITH ANESTHESIA     Patient location during evaluation: SICU Anesthesia Type: General Level of consciousness: sedated Pain management: pain level controlled Vital Signs Assessment: post-procedure vital signs reviewed and stable Respiratory status: patient remains intubated per anesthesia plan Cardiovascular status: stable Postop Assessment: no apparent nausea or vomiting Anesthetic complications: no   No notable events documented.  Last Vitals:  Vitals:   05/26/21 0800 05/26/21 0900  BP: 127/71 131/74  Pulse: 89 69  Resp: 14 15  Temp: 36.6 C   SpO2: 98% 99%    Last Pain:  Vitals:   05/26/21 0800  TempSrc: Axillary                 Belenda Cruise P Elad Macphail

## 2021-05-26 NOTE — Progress Notes (Signed)
OT Cancellation Note  Patient Details Name: Deborah Carey MRN: [id number] DOB: 09-08-1950   Cancelled Treatment:    Reason Eval/Treat Not Completed: Active bedrest order  [place], OT/L   Acute OT Clinical Specialist Acute Rehabilitation Services Pager [phone] Office [phone]  05/26/2021, 7:09 AM

## 2021-05-26 NOTE — Progress Notes (Signed)
PT Cancellation Note  Patient Details Name: Deborah Carey MRN: [id number] DOB: [birth year]   Cancelled Treatment:    Reason Eval/Treat Not Completed: Active bedrest order  Dilyn Smiles A. Gilford Rile PT, DPT Acute Rehabilitation Services Pager [phone] Office [phone]   Linna Hoff 05/26/2021, 7:59 AM

## 2021-05-26 NOTE — Consult Note (Addendum)
Cardiology Consultation:   Patient ID: Deborah Carey MRN: [id number]; DOB: 1950-09-11  Admit date: 05/25/2021 Date of Consult: 05/26/2021  PCP:  Abner Greenspan, MD   [facility] Providers Cardiologist:  New to [facility]; will likely following with [facility] in [employer] here to update MD or APP on Care Team, Refresh:1}     Patient Profile:   Deborah Carey is a 70 y.o. female with a PMH of OA, anxiety, and cataracts, who presented with an acute stroke now s/p mechanical thrombectomy who is being seen 05/26/2021 for the evaluation of atrial fibrillation at the request of Dr. Erlinda Hong.  History of Present Illness:   Ms. Gaul was in her usual state of health until 05/25/2021 when she developed sudden onset right-sided weakness with left eye deviation and aphasia while at the cancer center with her husband.  She was taken emergently to the ED and code stroke activated.  Given worsening mental status she was intubated in the ED and transferred emergently to [facility].  CTA showed left M1/MCA occlusion she was taken emergently to IR for mechanical thrombectomy which resulted in complete recanalization.  She was started on Cleviprex for hypertension and subsequently weaned off.  She was successfully extubated 05/26/2021.  She continues to have dysarthria and expressive aphasia. She was noted to have paroxysmal atrial fibrillation as seen on initial EKG with rates up to 126 bpm.  EKG today again with atrial fibrillation with rate 60 bpm.  Echocardiogram has been done, read pending.  Cardiology asked to evaluate.  She has no prior cardiac history.  No previous cardiac work-up. Appears patient has had prior concern for atrial fibrillation indicated on her apple watch. She reported this to PCP in 2019 and was told she was having PACs. EKG from 2019 personally reviewed and appears consistent with atrial fibrillation (not sinus with 1st degree AV block and PACs per machine read).   This  admission lipid panel showed LDL 109, A1c 5.8, TSH 4.1.  At the time of this evaluation she is resting comfortably in bed. Husband and daughter at bedside. She does not recall any issues with palpitations leading up to her stroke and remains unaware of any palpitations or fluttering now. She has no prior history with chest pain, SOB, DOE, dizziness, lightheadedness, or syncope. She does not snoring history, insomnia, and PND though has never had a sleep study.    Past Medical History:  Diagnosis Date   Allergy    allergic rhinitis   Anxiety    Cataract    removed both eyes    Infertility, female    Osteoarthritis of both knees    OA    PONV (postoperative nausea and vomiting)    Post-menopausal bleeding    neg endo bx.    Past Surgical History:  Procedure Laterality Date   cataract surgery  2011   CESAREAN SECTION     times 2   COLONOSCOPY  2010   hems    ENDOMETRIAL BIOPSY     neg for CA cells   FACIAL COSMETIC SURGERY  2006   face lift   FOOT SURGERY  1998   rt heel spur removal   IR CT HEAD LTD  05/25/2021   IR PERCUTANEOUS ART THROMBECTOMY/INFUSION INTRACRANIAL INC DIAG ANGIO  05/25/2021   IR US GUIDE VASC ACCESS RIGHT  05/25/2021   RADIOLOGY WITH ANESTHESIA N/A 05/25/2021   Procedure: IR WITH ANESTHESIA;  Surgeon: Aletta Edouard, MD;  Location: [facility];  Service: Radiology;  Laterality: N/A;   REFRACTIVE SURGERY  08/1999     Home Medications:  Prior to Admission medications   Medication Sig Start Date End Date Taking? Authorizing Provider  albuterol (PROVENTIL HFA;VENTOLIN HFA) 108 (90 Base) MCG/ACT inhaler Inhale 1-2 puffs into the lungs every 6 (six) hours as needed for wheezing (or for cough). 02/23/17   Duncan, Graham S, MD  CALCIUM PO Take by mouth as needed.     [provider]  cetirizine (ZYRTEC) 10 MG tablet Take 10 mg by mouth daily. Taking as needed    [provider]  Cholecalciferol (VITAMIN D3) 125 MCG (5000 UT) CAPS Take by mouth.     [provider]  fluticasone (FLONASE) 50 MCG/ACT nasal spray Place 2 sprays into both nostrils as needed. 02/23/17   Duncan, Graham S, MD  Multiple Vitamins-Minerals (MULTIVITAMIN PO) Take by mouth as needed.     [provider]  naproxen sodium (ANAPROX) 220 MG tablet Take 220 mg by mouth as needed.    [provider]  Omega-3 Fatty Acids (FISH OIL PO) Take by mouth as needed.     [provider]    Inpatient Medications: Scheduled Meds:   stroke: mapping our early stages of recovery book   Does not apply Once   chlorhexidine  15 mL Mouth/Throat BID   chlorhexidine gluconate (MEDLINE KIT)  15 mL Mouth Rinse BID   Chlorhexidine Gluconate Cloth  6 each Topical Daily   insulin aspart  0-15 Units Subcutaneous Q4H   mouth rinse  15 mL Mouth Rinse 10 times per day   pantoprazole (PROTONIX) IV  40 mg Intravenous Q24H   Continuous Infusions:  sodium chloride     sodium chloride 50 mL/hr at 05/26/21 1400   albumin human 100 mL/hr at 05/26/21 1400   phenylephrine (NEO-SYNEPHRINE) Adult infusion     PRN Meds: acetaminophen **OR** acetaminophen (TYLENOL) oral liquid 160 mg/5 mL **OR** acetaminophen, ondansetron (ZOFRAN) IV, perflutren lipid microspheres (DEFINITY) IV suspension, senna-docusate  Allergies:   No Known Allergies  Social History:   Social History   Socioeconomic History   Marital status: Married    Spouse name: Not on file   Number of children: Not on file   Years of education: Not on file   Highest education level: Not on file  Occupational History   Occupation: retired  Tobacco Use   Smoking status: Never   Smokeless tobacco: Never  Vaping Use   Vaping Use: Never used  Substance and Sexual Activity   Alcohol use: No    Alcohol/week: 0.0 standard drinks   Drug use: No   Sexual activity: Yes    Partners: Male    Birth control/protection: Post-menopausal  Other Topics Concern   Not on file  Social History Narrative   Not on  file   Social Determinants of Health   Financial Resource Strain: Not on file  Food Insecurity: Not on file  Transportation Needs: Not on file  Physical Activity: Not on file  Stress: Not on file  Social Connections: Not on file  Intimate Partner Violence: Not on file    Family History:    Family History  Problem Relation Age of Onset   COPD Brother    Stroke Mother    Hypertension Mother    Osteoporosis Mother    Alzheimer's disease Father    Hypertension Father    Osteoporosis Father    Depression Sister    Hypertension Sister      Osteoporosis Sister    Thyroid disease Sister    Hypothyroidism Sister    Parkinson's disease Sister    Osteoporosis Sister    Hypothyroidism Sister    Breast cancer Neg Hx    Colon polyps Neg Hx    Colon cancer Neg Hx    Esophageal cancer Neg Hx    Rectal cancer Neg Hx    Stomach cancer Neg Hx      ROS:  Please see the history of present illness.   All other ROS reviewed and negative.     Physical Exam/Data:   Vitals:   05/26/21 1213 05/26/21 1300 05/26/21 1400 05/26/21 1500  BP: 119/69 106/81 129/61   Pulse: 78 69 81 70  Resp: (!) _0 Temp: 98.1 F (36.7 C)     TempSrc: Oral     SpO2: 100% 94% 96% 93%  Weight:      Height:        Intake/Output Summary (Last 24 hours) at 05/26/2021 1608 Last data filed at 05/26/2021 1400 Gross per 24 hour  Intake 1532.05 ml  Output 550 ml  Net 982.05 ml   Last 3 Weights 05/25/2021 05/25/2021 05/29/2020  Weight (lbs) 211 lb 10.3 oz 214 lb 214 lb 6 oz  Weight (kg) 96 kg 97.07 kg 97.24 kg     Body mass index is 36.33 kg/m.  General:  Well nourished, well developed, in no acute distress, somewhat sleepy HEENT: sclera anicteric Neck: no JVD Vascular: No carotid bruits; Distal pulses 2+ bilaterally Cardiac:  normal S1, S2; IRIR; no murmur  Lungs:  clear to auscultation bilaterally, no wheezing, rhonchi or rales  Abd: soft, obese, non-tender, no hepatomegaly  Ext: no  edema Musculoskeletal:  No deformities, ongoing right-sided weakness Skin: warm and dry  Neuro:  mild dysarthria and expressive aphasia - answers yes/no questions appropriately Psych:  Normal affect   EKG:  The EKG was personally reviewed and demonstrates:  initial EKG with atrial fibrillation with RVR, rate 126bpm, PVCs, non-specific ST-T wave abnormalities; repeat with atrial fibrillation with rate 61bpm, no STE/D Telemetry:  Telemetry was personally reviewed and demonstrates:  persistent atrial fibrillation   Relevant CV Studies: Echocardiogram 05/26/21: - Await read  Laboratory Data:  High Sensitivity Troponin:   Recent Labs  Lab 05/25/21 1447  TROPONINIHS 7     Chemistry Recent Labs  Lab 05/25/21 1520 05/25/21 1907 05/25/21 2204 05/26/21 0429  NA 138  --  138 137  K 4.0  --  4.1 3.9  CL 102  --   --  103  CO2 26  --   --  23  GLUCOSE 128*  --   --  146*  BUN 18  --   --  14  CREATININE 0.83  --   --  0.78  CALCIUM 9.4  --   --  8.9  MG  --  2.1  --   --   GFRNONAA >60  --   --  >60  ANIONGAP 10  --   --  11    Recent Labs  Lab 05/25/21 1520 05/26/21 0429  PROT 7.3 6.1*  ALBUMIN 4.2 3.4*  AST 20 18  ALT 16 15  ALKPHOS 68 47  BILITOT 0.8 0.6   Lipids  Recent Labs  Lab 05/26/21 0429  CHOL 171  TRIG 64  66  HDL 49  LDLCALC 109*  CHOLHDL 3.5    Hematology Recent Labs  Lab 05/25/21 1447 05/25/21 2204 05/26/21 0429  WBC 10.0  --  11.0*  RBC 4.37  --  4.13  HGB 14.8 12.9 13.1  HCT 40.3 38.0 38.6  MCV 92.2  --  93.5  MCH 33.9  --  31.7  MCHC 36.7*  --  33.9  RDW 12.3  --  12.4  PLT 267  --  206   Thyroid  Recent Labs  Lab 05/26/21 0429  TSH 4.101    BNPNo results for input(s): BNP, PROBNP in the last 168 hours.  DDimer No results for input(s): DDIMER in the last 168 hours.   Radiology/Studies:  MR ANGIO HEAD WO CONTRAST  Result Date: 05/26/2021 CLINICAL DATA:  Follow-up stroke. Left-sided weakness. Left MCA occlusion with  intervention. EXAM: MRA HEAD WITHOUT CONTRAST TECHNIQUE: Angiographic images of the Circle of Willis were acquired using MRA technique without intravenous contrast. COMPARISON:  MRI earlier same day FINDINGS: Anterior circulation: Both internal carotid arteries are widely patent through the skull base and siphon regions. The anterior and middle cerebral vessels are patent. Reconstituted flow in the left MCA without proximal stenosis or missing distal branch vessels. Posterior circulation: Both vertebral arteries widely patent to the basilar. No basilar stenosis. Stenoses in the PCA P2 segments, worse on the left than the right. Anatomic variants: None significant. Other: None. IMPRESSION: Good appearance of the anterior circulation flow presently. Status post mechanical thrombectomy on the left. Wide patency of the left MCA branches presently. 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 Electronically Signed   By: Mark  Shogry M.D.   On: 05/26/2021 15:18   MR BRAIN WO CONTRAST  Result Date: 05/26/2021 CLINICAL DATA:  Follow-up stroke. EXAM: MRI HEAD WITHOUT CONTRAST TECHNIQUE: Multiplanar, multiecho pulse sequences of the brain and surrounding structures were obtained without intravenous contrast. COMPARISON:  MR angiography same day. CT studies and intervention done yesterday. FINDINGS: Brain: There is acute infarction affecting the corpus stratum on the left. Minimal punctate infarction in the left posterior frontal subcortical white matter. Swelling of the putamen and caudate, with minimal petechial blood products but without frank hemorrhage. Three punctate foci of acute infarction in the right medial frontal lobe, possibly small embolic infarctions in the right anterior cerebral artery territory. No midline shift. Elsewhere, there is old cortical and subcortical infarction in the right parietal lobe and moderate chronic small-vessel ischemic changes of the cerebral hemispheric white  matter. No hydrocephalus. No extra-axial collection. Vascular: Major vessels at the base of the brain show flow. Skull and upper cervical spine: Negative Sinuses/Orbits: Clear/normal Other: None IMPRESSION: Acute infarction of the left putamen and caudate. Minimal petechial blood products but no frank hematoma. Mild swelling but no midline shift. Three punctate acute infarctions within the medial right frontal lobe, probably micro embolic infarctions in the anterior cerebral artery territory. Old infarction in the right parietal cortical and subcortical brain and seen affecting the cerebral hemispheric deep white matter extensively. Electronically Signed   By: Mark  Shogry M.D.   On: 05/26/2021 15:51   IR CT Head Ltd  Result Date: 05/25/2021 INDICATION: Deborah Carey is an 70 year-old female with a past medical history significant for osteoarthritis and cataracts who presented emergently to the ED at [facility] due to right side weakness, with eyes drifting to left, and unable to talk with decreased level of consciousness. NIHSS at presentation was 19; baseline modified Rankin scale 0. Head CT showed no evidence of a large acute infarct or hemorrhage (ASPECTS 10). CTA showed a proximal left M1/MCA occlusion with   good collaterals. She receive IV TNK and was then transferred to our service for a diagnostic cerebral angiogram and mechanical thrombectomy. EXAM: ULTRASOUND GUIDED VASCULAR ACCESS DIAGNOSTIC CEREBRAL ANGIOGRAM MECHANICAL THROMBECTOMY FLAT PANEL HEAD CT COMPARISON:  CT/CTA 05/25/2021. MEDICATIONS: No antibiotic administered. ANESTHESIA/SEDATION: The procedure was performed under general anesthesia. CONTRAST:  25 mL of Omnipaque 240 mg/mL FLUOROSCOPY TIME:  Fluoroscopy Time: 4 minutes 36 seconds (420 mGy). COMPLICATIONS: None immediate. TECHNIQUE: Informed written consent was obtained from the patient's husband after a thorough discussion of the procedural risks, benefits and alternatives. All questions were  addressed. Maximal Sterile Barrier Technique was utilized including caps, mask, sterile gowns, sterile gloves, sterile drape, hand hygiene and skin antiseptic. A timeout was performed prior to the initiation of the procedure. The right groin was prepped and draped in the usual sterile fashion. Using a micropuncture kit and the modified Seldinger technique, access was gained to the right common femoral artery and an 8 French sheath was placed. Real-time ultrasound guidance was utilized for vascular access including the acquisition of a permanent ultrasound image documenting patency of the accessed vessel. Under fluoroscopy, a Zoom 88 guide catheter was navigated over a 6 French Berenstein 2 catheter and a 0.035" Terumo Glidewire into the aortic arch. The catheter was placed into the left common carotid artery and then advanced into the left internal carotid artery. The Berenstein 2 catheter was removed. Frontal and lateral angiograms of the head were obtained. FINDINGS: 1. Normal caliber of the right common femoral artery. 2. Occlusion of the left MCA at the proximal M1 segment. PROCEDURE: Under biplane roadmap, a zoom 71 aspiration catheter was navigated over an Aristotle 24 microguidewire into the cavernous segment of the left ICA. The aspiration catheter was then advanced to the level of occlusion and connected to an aspiration pump. Continuous aspiration was performed for 2 minutes. The guide catheter was connected to a VacLok syringe and advanced into the M1 segment while the aspiration catheter wasremoved under constant aspiration. The Zoom 88 guide catheter was continuously aspirated until clear blood return was obtained. Left ICA angiograms with frontal and lateral views showed complete left MCA vascular tree recanalization without evidence of thromboembolic complication. Flat panel CT of the head was obtained and post processed in a separate workstation with concurrent attending physician supervision.  Selected images were sent to PACS. No evidence of hemorrhagic complication. A right common femoral artery angiogram was then obtained in right anterior oblique view via sheath side port. The puncture is at the level of the common femoral artery which has normal caliber and contour, adequate for closure device. The femoral sheath was exchanged over the wire for a Perclose ProStyle which was utilized for access closure. Immediate hemostasis was achieved. IMPRESSION: Successful and uncomplicated mechanical thrombectomy performed with direct contact aspiration for treatment of a proximal left M1/MCA occlusion with complete recanalization (TICI3). PLAN: 1. Transfer to ICA for continued care. 2. SBP 120-140 mmHg for 24 hours post procedure. 3. Bed rest for 6 hours. Electronically Signed   By: Katyucia  de Macedo Rodrigues M.D.   On: 05/25/2021 17:58   IR US Guide Vasc Access Right  Result Date: 05/25/2021 INDICATION: Deborah Carey is an 70 year-old female with a past medical history significant for osteoarthritis and cataracts who presented emergently to the ED at [facility] due to right side weakness, with eyes drifting to left, and unable to talk with decreased level of consciousness. NIHSS at presentation was 19; baseline modified Rankin scale 0. Head CT showed   no evidence of a large acute infarct or hemorrhage (ASPECTS 10). CTA showed a proximal left M1/MCA occlusion with good collaterals. She receive IV TNK and was then transferred to our service for a diagnostic cerebral angiogram and mechanical thrombectomy. EXAM: ULTRASOUND GUIDED VASCULAR ACCESS DIAGNOSTIC CEREBRAL ANGIOGRAM MECHANICAL THROMBECTOMY FLAT PANEL HEAD CT COMPARISON:  CT/CTA 05/25/2021. MEDICATIONS: No antibiotic administered. ANESTHESIA/SEDATION: The procedure was performed under general anesthesia. CONTRAST:  25 mL of Omnipaque 240 mg/mL FLUOROSCOPY TIME:  Fluoroscopy Time: 4 minutes 36 seconds (420 mGy). COMPLICATIONS: None immediate. TECHNIQUE:  Informed written consent was obtained from the patient's husband after a thorough discussion of the procedural risks, benefits and alternatives. All questions were addressed. Maximal Sterile Barrier Technique was utilized including caps, mask, sterile gowns, sterile gloves, sterile drape, hand hygiene and skin antiseptic. A timeout was performed prior to the initiation of the procedure. The right groin was prepped and draped in the usual sterile fashion. Using a micropuncture kit and the modified Seldinger technique, access was gained to the right common femoral artery and an 8 French sheath was placed. Real-time ultrasound guidance was utilized for vascular access including the acquisition of a permanent ultrasound image documenting patency of the accessed vessel. Under fluoroscopy, a Zoom 88 guide catheter was navigated over a 6 French Berenstein 2 catheter and a 0.035" Terumo Glidewire into the aortic arch. The catheter was placed into the left common carotid artery and then advanced into the left internal carotid artery. The Berenstein 2 catheter was removed. Frontal and lateral angiograms of the head were obtained. FINDINGS: 1. Normal caliber of the right common femoral artery. 2. Occlusion of the left MCA at the proximal M1 segment. PROCEDURE: Under biplane roadmap, a zoom 71 aspiration catheter was navigated over an Aristotle 24 microguidewire into the cavernous segment of the left ICA. The aspiration catheter was then advanced to the level of occlusion and connected to an aspiration pump. Continuous aspiration was performed for 2 minutes. The guide catheter was connected to a VacLok syringe and advanced into the M1 segment while the aspiration catheter wasremoved under constant aspiration. The Zoom 88 guide catheter was continuously aspirated until clear blood return was obtained. Left ICA angiograms with frontal and lateral views showed complete left MCA vascular tree recanalization without evidence of  thromboembolic complication. Flat panel CT of the head was obtained and post processed in a separate workstation with concurrent attending physician supervision. Selected images were sent to PACS. No evidence of hemorrhagic complication. A right common femoral artery angiogram was then obtained in right anterior oblique view via sheath side port. The puncture is at the level of the common femoral artery which has normal caliber and contour, adequate for closure device. The femoral sheath was exchanged over the wire for a Perclose ProStyle which was utilized for access closure. Immediate hemostasis was achieved. IMPRESSION: Successful and uncomplicated mechanical thrombectomy performed with direct contact aspiration for treatment of a proximal left M1/MCA occlusion with complete recanalization (TICI3). PLAN: 1. Transfer to ICA for continued care. 2. SBP 120-140 mmHg for 24 hours post procedure. 3. Bed rest for 6 hours. Electronically Signed   By: Katyucia  de Macedo Rodrigues M.D.   On: 05/25/2021 17:58   DG CHEST PORT 1 VIEW  Result Date: 05/26/2021 CLINICAL DATA:  Status post intubation. EXAM: PORTABLE CHEST 1 VIEW COMPARISON:  05/25/2021 FINDINGS: The ETT tip is 1.2 cm above the carina. Normal heart size. Interval resolution of previous interstitial and airspace opacities. Remote appearing right posterior rib fractures.   IMPRESSION: 1. Stable position of ET tube with tip above the carina. 2. Interval clearing of interstitial edema. Electronically Signed   By: Kerby Moors M.D.   On: 05/26/2021 05:59   DG Chest Portable 1 View  Result Date: 05/25/2021 CLINICAL DATA:  Shortness of breath EXAM: PORTABLE CHEST 1 VIEW COMPARISON:  None. FINDINGS: Endotracheal tube terminates proximally 1.5 cm above the carina. Heart size appears mildly enlarged. Diffuse bilateral interstitial prominence. No pleural effusion or pneumothorax. Age indeterminate fractures of the posterior right seventh and likely eighth ribs.  IMPRESSION: 1. Endotracheal tube terminates 1.5 cm above the carina. 2. Diffuse bilateral interstitial prominence may reflect atelectasis, edema, versus atypical/viral infection. 3. Age indeterminate fractures of the posterior right seventh and likely eighth ribs. Electronically Signed   By: Davina Poke D.O.   On: 05/25/2021 16:03   IR PERCUTANEOUS ART THROMBECTOMY/INFUSION INTRACRANIAL INC DIAG ANGIO  Result Date: 05/25/2021 INDICATION: Deborah Carey is an 70 year-old female with a past medical history significant for osteoarthritis and cataracts who presented emergently to the ED at [facility] due to right side weakness, with eyes drifting to left, and unable to talk with decreased level of consciousness. NIHSS at presentation was 19; baseline modified Rankin scale 0. Head CT showed no evidence of a large acute infarct or hemorrhage (ASPECTS 10). CTA showed a proximal left M1/MCA occlusion with good collaterals. She receive IV TNK and was then transferred to our service for a diagnostic cerebral angiogram and mechanical thrombectomy. EXAM: ULTRASOUND GUIDED VASCULAR ACCESS DIAGNOSTIC CEREBRAL ANGIOGRAM MECHANICAL THROMBECTOMY FLAT PANEL HEAD CT COMPARISON:  CT/CTA 05/25/2021. MEDICATIONS: No antibiotic administered. ANESTHESIA/SEDATION: The procedure was performed under general anesthesia. CONTRAST:  25 mL of Omnipaque 240 mg/mL FLUOROSCOPY TIME:  Fluoroscopy Time: 4 minutes 36 seconds (420 mGy). COMPLICATIONS: None immediate. TECHNIQUE: Informed written consent was obtained from the patient's husband after a thorough discussion of the procedural risks, benefits and alternatives. All questions were addressed. Maximal Sterile Barrier Technique was utilized including caps, mask, sterile gowns, sterile gloves, sterile drape, hand hygiene and skin antiseptic. A timeout was performed prior to the initiation of the procedure. The right groin was prepped and draped in the usual sterile fashion. Using a micropuncture  kit and the modified Seldinger technique, access was gained to the right common femoral artery and an 8 French sheath was placed. Real-time ultrasound guidance was utilized for vascular access including the acquisition of a permanent ultrasound image documenting patency of the accessed vessel. Under fluoroscopy, a Zoom 88 guide catheter was navigated over a 6 [place] Berenstein 2 catheter and a 0.035" Terumo Glidewire into the aortic arch. The catheter was placed into the left common carotid artery and then advanced into the left internal carotid artery. The Berenstein 2 catheter was removed. Frontal and lateral angiograms of the head were obtained. FINDINGS: 1. Normal caliber of the right common femoral artery. 2. Occlusion of the left MCA at the proximal M1 segment. PROCEDURE: Under biplane roadmap, a zoom 71 aspiration catheter was navigated over an Aristotle 24 microguidewire into the cavernous segment of the left ICA. The aspiration catheter was then advanced to the level of occlusion and connected to an aspiration pump. Continuous aspiration was performed for 2 minutes. The guide catheter was connected to a VacLok syringe and advanced into the M1 segment while the aspiration catheter wasremoved under constant aspiration. The Zoom 88 guide catheter was continuously aspirated until clear blood return was obtained. Left ICA angiograms with frontal and lateral views showed complete left  MCA vascular tree recanalization without evidence of thromboembolic complication. Flat panel CT of the head was obtained and post processed in a separate workstation with concurrent attending physician supervision. Selected images were sent to PACS. No evidence of hemorrhagic complication. A right common femoral artery angiogram was then obtained in right anterior oblique view via sheath side port. The puncture is at the level of the common femoral artery which has normal caliber and contour, adequate for closure device. The femoral  sheath was exchanged over the wire for a Perclose ProStyle which was utilized for access closure. Immediate hemostasis was achieved. IMPRESSION: Successful and uncomplicated mechanical thrombectomy performed with direct contact aspiration for treatment of a proximal left M1/MCA occlusion with complete recanalization (TICI3). PLAN: 1. Transfer to ICA for continued care. 2. SBP 120-140 mmHg for 24 hours post procedure. 3. Bed rest for 6 hours. Electronically Signed   By: Pedro Earls M.D.   On: 05/25/2021 17:58   CT HEAD CODE STROKE WO CONTRAST  Result Date: 05/25/2021 CLINICAL DATA:  Left-sided weakness EXAM: CT ANGIOGRAPHY HEAD AND NECK TECHNIQUE: Multidetector CT imaging of the head and neck was performed using the standard protocol during bolus administration of intravenous contrast. Multiplanar CT image reconstructions and MIPs were obtained to evaluate the vascular anatomy. Carotid stenosis measurements (when applicable) are obtained utilizing NASCET criteria, using the distal internal carotid diameter as the denominator. CONTRAST:  8m OMNIPAQUE IOHEXOL 350 MG/ML SOLN COMPARISON:  None. FINDINGS: CT HEAD FINDINGS Brain: There is ill-defined hypodensity in the left centrum semiovale. The left basal ganglia and insular cortex are preserved. The cortex throughout the left MCA distribution is preserved. There is focal hypodensity in the right parietal lobe likely reflecting remote infarct. Additional foci of hypodensity in the bilateral cerebral hemispheres are nonspecific but may reflect sequela of chronic white matter microangiopathy. There is no evidence of acute intracranial hemorrhage or extra-axial fluid collection. The ventricles are not enlarged. There is no mass lesion. There is no midline shift. Vascular: There is a dense MCA on the left. See below. Skull: Normal. Negative for fracture or focal lesion. Sinuses/orbits: The imaged paranasal sinuses are clear. The mastoid air cells are  clear. Bilateral lens implants are in place. The globes and orbits are otherwise unremarkable. Review of the MIP images confirms the above findings CTA NECK FINDINGS The CTA images are moderately degraded by motion artifact. Aortic arch: Standard branching. Imaged portion shows no evidence of aneurysm or dissection. No significant stenosis of the major arch vessel origins. Right carotid system: The right carotid system is suboptimally evaluated particularly in the region of the carotid bulb due to motion artifact. The right internal carotid artery has a medialized course. Otherwise, there is no definite focal stenosis, occlusion, dissection, or aneurysm. Left carotid system: The left carotid system is suboptimally evaluated due to motion artifact particularly in the region of the carotid bulb. There is calcified atherosclerotic plaque of the left carotid bulb without definite hemodynamically significant stenosis or occlusion. There is no definite dissection or aneurysm. Vertebral arteries: The vertebral arteries appear patent, without definite focal stenosis, dissection, occlusion, or aneurysm. Skeleton: There is mild multilevel degenerative change of the cervical spine. There is no acute osseous abnormality or aggressive osseous lesion. Other neck: The soft tissues are unremarkable. Upper chest: There is a 1.1 cm x 1.0 cm soft tissue nodule in the medial right apex. Review of the MIP images confirms the above findings CTA HEAD FINDINGS Anterior circulation: There is calcification of the  bilateral cavernous ICAs without focal stenosis or occlusion. There is occlusion of the proximal left M1 segment with reconstitution of flow at the M2 segments. There appears to be good collateral flow throughout the peripheral MCA distribution. The right MCA is patent. The bilateral ACAs are patent. The right A1 segment is diminutive. There is no aneurysm. Posterior circulation: The posterior circulation is suboptimally evaluated  due to motion artifact. The V4 segments of the vertebral arteries are patent. The basilar artery is patent. There is multifocal moderate stenosis of the left P2 segment (9-256, 10-116, 10-123). The right PCA is patent.  There is no aneurysm. Venous sinuses: As permitted by contrast timing, patent. Anatomic variants: None. Review of the MIP images confirms the above findings IMPRESSION: 1. Occlusion of the left M1 segment with reconstitution of flow at the M2 segments with good collateral flow throughout the remainder of the MCA distribution. 2. No evidence of left MCA territory infarct at the time of imaging. Aspects is 10. 3. The CTA images are moderately motion degraded. There is no definite high-grade stenosis in the neck. 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 4. 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 Other foci of hypodensity in the supratentorial white matter likely reflects sequela of chronic white matter microangiopathy. 5. 1.1 cm soft tissue nodule in the medial right apex. Consider one of the following in 3 months for both low-risk and high-risk individuals: (a) repeat chest CT, (b) follow-up PET-CT, or (c) tissue sampling. This recommendation follows the consensus statement: Guidelines for Management of Incidental Pulmonary Nodules Detected on CT Images: From the Fleischner Society 2017; Radiology 2017; 284:228-243. The acute results were called by telephone at the time of interpretation on 05/25/2021 at 3:05 pm to provider Dr Lindzen , who verbally acknowledged these results. Electronically Signed   By: Peter  Noone M.D.   On: 05/25/2021 15:22   CT ANGIO HEAD NECK W WO CM (CODE STROKE)  Result Date: 05/25/2021 CLINICAL DATA:  Left-sided weakness EXAM: CT ANGIOGRAPHY HEAD AND NECK TECHNIQUE: Multidetector CT imaging of the head and neck was performed using the standard protocol during bolus administration of intravenous contrast. Multiplanar CT image  reconstructions and MIPs were obtained to evaluate the vascular anatomy. Carotid stenosis measurements (when applicable) are obtained utilizing NASCET criteria, using the distal internal carotid diameter as the denominator. CONTRAST:  75mL OMNIPAQUE IOHEXOL 350 MG/ML SOLN COMPARISON:  None. FINDINGS: CT HEAD FINDINGS Brain: There is ill-defined hypodensity in the left centrum semiovale. The left basal ganglia and insular cortex are preserved. The cortex throughout the left MCA distribution is preserved. There is focal hypodensity in the right parietal lobe likely reflecting remote infarct. Additional foci of hypodensity in the bilateral cerebral hemispheres are nonspecific but may reflect sequela of chronic white matter microangiopathy. There is no evidence of acute intracranial hemorrhage or extra-axial fluid collection. The ventricles are not enlarged. There is no mass lesion. There is no midline shift. Vascular: There is a dense MCA on the left. See below. Skull: Normal. Negative for fracture or focal lesion. Sinuses/orbits: The imaged paranasal sinuses are clear. The mastoid air cells are clear. Bilateral lens implants are in place. The globes and orbits are otherwise unremarkable. Review of the MIP images confirms the above findings CTA NECK FINDINGS The CTA images are moderately degraded by motion artifact. Aortic arch: Standard branching. Imaged portion shows no evidence of aneurysm or dissection. No significant stenosis of the major arch vessel origins. Right carotid   system: The right carotid system is suboptimally evaluated particularly in the region of the carotid bulb due to motion artifact. The right internal carotid artery has a medialized course. Otherwise, there is no definite focal stenosis, occlusion, dissection, or aneurysm. Left carotid system: The left carotid system is suboptimally evaluated due to motion artifact particularly in the region of the carotid bulb. There is calcified atherosclerotic  plaque of the left carotid bulb without definite hemodynamically significant stenosis or occlusion. There is no definite dissection or aneurysm. Vertebral arteries: The vertebral arteries appear patent, without definite focal stenosis, dissection, occlusion, or aneurysm. Skeleton: There is mild multilevel degenerative change of the cervical spine. There is no acute osseous abnormality or aggressive osseous lesion. Other neck: The soft tissues are unremarkable. Upper chest: There is a 1.1 cm x 1.0 cm soft tissue nodule in the medial right apex. Review of the MIP images confirms the above findings CTA HEAD FINDINGS Anterior circulation: There is calcification of the bilateral cavernous ICAs without focal stenosis or occlusion. There is occlusion of the proximal left M1 segment with reconstitution of flow at the M2 segments. There appears to be good collateral flow throughout the peripheral MCA distribution. The right MCA is patent. The bilateral ACAs are patent. The right A1 segment is diminutive. There is no aneurysm. Posterior circulation: The posterior circulation is suboptimally evaluated due to motion artifact. The V4 segments of the vertebral arteries are patent. The basilar artery is patent. There is multifocal moderate stenosis of the left P2 segment (9-256, 10-116, 10-123). The right PCA is patent.  There is no aneurysm. Venous sinuses: As permitted by contrast timing, patent. Anatomic variants: None. Review of the MIP images confirms the above findings IMPRESSION: 1. Occlusion of the left M1 segment with reconstitution of flow at the M2 segments with good collateral flow throughout the remainder of the MCA distribution. 2. No evidence of left MCA territory infarct at the time of imaging. Aspects is 10. 3. The CTA images are moderately motion degraded. There is no definite high-grade stenosis in the neck. 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 4. Small remote infarct in the  right parietal lobe. Other foci of hypodensity in the supratentorial white matter likely reflects sequela of chronic white matter microangiopathy. 5. 1.1 cm soft tissue nodule in the medial right apex. Consider one of the following in 3 months for both low-risk and high-risk individuals: (a) repeat chest CT, (b) follow-up PET-CT, or (c) tissue sampling. This recommendation follows the consensus statement: Guidelines for Management of Incidental Pulmonary Nodules Detected on CT Images: From the Fleischner Society 2017; Radiology 2017; 284:228-243. The acute results were called by telephone at the time of interpretation on 05/25/2021 at 3:05 pm to provider Dr Lindzen , who verbally acknowledged these results. Electronically Signed   By: Peter  Noone M.D.   On: 05/25/2021 15:22     Assessment and Plan:   1. Persistent atrial fibrillation: patient unfortunately presented with M1/MCA stroke. Found to be in atrial fibrillation with RVR, rate 126bpm on admission EKG. TSH is wnl. LDL 109CHA2DS2-VASc Score = 4 [CHF History: 0, HTN History: 0, Diabetes History: 0, Stroke History: 2, Vascular Disease History: 0, Age Score: 1, Gender Score: 1].  Therefore, the patient's annual risk of stroke is 4.8 %.  Prior EKGs reviewed and actually appears to have had Afib back in 2019 though not identified as such and not started on OAC. She continues to be in afib with CVR on   telemetry with occasional 2 second pauses.  - Echo pending to assess LV function, valve function, and wall motion - Would avoid AV nodal blocking agents given reasonable rate control at this time and evidence of 2 second pauses on telemetry - Would benefit from initiation of eliquis 5mg BID - recommend starting when cleared by neurology  - Would consider DCCV after 3 weeks of uninterrupted anticoagulation if atrial fibrillation persists  2. Stroke: patient presented with M1/MCA CVA and underwent thrombectomy by IR. Still with residual right sided weakness,  dysarthria, and expressive aphasia. Likely cardioembolic given #1.  - Would benefit from eliquis as above - recommend starting when cleared by neurology - Continue statin  3. Suspected OSA: patient with complaints of snoring, daytime somnolence, insomnia, and PND. No prior sleep study. In light of #1, would benefit from a sleep study to evaluate for OSA - Will need to coordinate outpatient sleep study at follow-up visit   4. HLD: LDL 109 this admission.  - Will start atorvastatin 40mg daily - Will need repeat FLP/LFTs in 6-8 weeks   5. Pre-DM type 2: A1C 5.8 this admission - Encourage dietary/lifestyle modifications to prevent progression    Risk Assessment/Risk Scores:        CHA2DS2-VASc Score = 4  This indicates a 4.8% annual risk of stroke. The patient's score is based upon: CHF History: 0 HTN History: 0 Diabetes History: 0 Stroke History: 2 Vascular Disease History: 0 Age Score: 1 Gender Score: 1       For questions or updates, please contact [facility] Please consult [email or web address] for contact info under    Signed, Krista M. Kroeger, PA-C  05/26/2021 4:08 PM  I have examined the patient and reviewed assessment and plan and discussed with patient.  Agree with above as stated.    Patient with atrial fibrillation and variable rate control.  Currently not requiring rate control medicines.  We will continue to monitor.  Add anticoagulant when safe from a neurologic standpoint.  In reviewing her CTA of the neck, there is some atherosclerotic plaque.  Start statin.    Echocardiogram result pending.  Will adjust medical therapy based on LVEF.  Discussed cardioversion after about a month of anticoagulation with the family, but this would depend on her symptoms.     

## 2021-05-26 NOTE — Progress Notes (Signed)
SLP Cancellation Note  Patient Details Name: Deborah Carey MRN: [id number] DOB: 11-Jul-1951   Cancelled treatment:        SLP going to do BSE after failed [facility] with RN however pt going to MRI. Will continue tomorrow.    Houston Siren 05/26/2021, 4:54 PM

## 2021-05-26 NOTE — Progress Notes (Signed)
Wasted 116mL of Fentanyl from the bag. Witnessed by Delorise Jackson, RN.

## 2021-05-26 NOTE — Progress Notes (Signed)
Called by RN that patient started agitated and restless.  Came to see patient, she seems to be in pain, right hand keep holding onto the right lower quadrant of abdomen.  Face flushed, BP and heart rate goes up, afebrile.  Talked with husband and daughter at bedside, they reported that patient seems comfortable and he was 6 PM when she seemed to have right hip pain, difficult to find a comfortable position.  Then patient seems more lethargic, eyes close and some heavy breathing.  Denies alcohol use.  Patient abdomen soft, no significant tenderness on palpation, right groin procedure incision site dry and soft, no hematoma.  No rebound tenderness either.  Discussed with Dr. Tacy Learn, bedside ultrasound showed urinary retention.  Had in and out for about 300 cc urine, however patient did not improve after that.  Will give fentanyl 25 mcg and then stat CT abdomen pelvis.  Husband and daughter at bedside updated for the finding and further test.  Rosalin Hawking, MD PhD Stroke Neurology 05/26/2021 7:25 PM

## 2021-05-26 NOTE — Procedures (Signed)
Extubation Procedure Note  Patient Details:   Name: IMARI REEN DOB: 25-Jun-1951 MRN: [id number]   Airway Documentation:    Vent end date: 05/26/21 Vent end time: 0757   Evaluation  O2 sats: stable throughout Complications: No apparent complications Patient did tolerate procedure well. Bilateral Breath Sounds: Clear, Diminished   Yes  Pt was extubated per MD order and placed on 3 L [place]. Cuff leak was heard prior to extubation and no stridor post. Pt is stable at this time. Rt will monitor.   Ronaldo Miyamoto 05/26/2021, 7:58 AM

## 2021-05-26 NOTE — Progress Notes (Signed)
Referring Physician(s): Dr. Lynnae Sandhoff Code Stroke  Supervising Physician: Katherina Right Lyla Glassing  Patient Status:  [facility] - In-pt  Chief Complaint: Code stroke Acute M1 ischemic stroke  Subjective: Patient resting in bed.  Very sleepy/tired. Just worked with PT.  Aphasia, dysarthria.  Family at bedside.   Allergies: Patient has no known allergies.  Medications: Prior to Admission medications   Medication Sig Start Date End Date Taking? Authorizing Provider  albuterol (PROVENTIL HFA;VENTOLIN HFA) 108 (90 Base) MCG/ACT inhaler Inhale 1-2 puffs into the lungs every 6 (six) hours as needed for wheezing (or for cough). 02/23/17   Tonia Ghent, MD  CALCIUM PO Take by mouth as needed.     [provider]  cetirizine (ZYRTEC) 10 MG tablet Take 10 mg by mouth daily. Taking as needed    [provider]  Cholecalciferol (VITAMIN D3) 125 MCG (5000 UT) CAPS Take by mouth.    [provider]  fluticasone (FLONASE) 50 MCG/ACT nasal spray Place 2 sprays into both nostrils as needed. 02/23/17   Tonia Ghent, MD  Multiple Vitamins-Minerals (MULTIVITAMIN PO) Take by mouth as needed.     [provider]  naproxen sodium (ANAPROX) 220 MG tablet Take 220 mg by mouth as needed.    [provider]  Omega-3 Fatty Acids (FISH OIL PO) Take by mouth as needed.     [provider]     Vital Signs: BP 129/61   Pulse 81   Temp 98.1 F (36.7 C) (Oral)   Resp 17   Ht _0  (1.626 m)   Wt 211 lb 10.3 oz (96 kg)   LMP 09/05/2008   SpO2 96%   BMI 36.33 kg/m   Physical Exam NAD, alert Neuro: alert, but sleepy. Following commands. Expressive aphasia. Moderate dysarthria. Mild right facial droop.  Mild right pronator drift. Able to lift right leg off th bed.  Skin: groin procedure site intact. No oozing, bruising, hematoma, or pseudoaneurysm.   Imaging: IR CT Head Ltd  Result Date: 05/25/2021 INDICATION: Deborah Carey is an 70  year-old female with a past medical history significant for osteoarthritis and cataracts who presented emergently to the ED at [facility] due to right side weakness, with eyes drifting to left, and unable to talk with decreased level of consciousness. NIHSS at presentation was 19; baseline modified Rankin scale 0. Head CT showed no evidence of a large acute infarct or hemorrhage (ASPECTS 10). CTA showed a proximal left M1/MCA occlusion with good collaterals. She receive IV TNK and was then transferred to our service for a diagnostic cerebral angiogram and mechanical thrombectomy. EXAM: ULTRASOUND GUIDED VASCULAR ACCESS DIAGNOSTIC CEREBRAL ANGIOGRAM MECHANICAL THROMBECTOMY FLAT PANEL HEAD CT COMPARISON:  CT/CTA 05/25/2021. MEDICATIONS: No antibiotic administered. ANESTHESIA/SEDATION: The procedure was performed under general anesthesia. CONTRAST:  25 mL of Omnipaque 240 mg/mL FLUOROSCOPY TIME:  Fluoroscopy Time: 4 minutes 36 seconds (420 mGy). COMPLICATIONS: None immediate. TECHNIQUE: Informed written consent was obtained from the patient's husband after a thorough discussion of the procedural risks, benefits and alternatives. All questions were addressed. Maximal Sterile Barrier Technique was utilized including caps, mask, sterile gowns, sterile gloves, sterile drape, hand hygiene and skin antiseptic. A timeout was performed prior to the initiation of the procedure. The right groin was prepped and draped in the usual sterile fashion. Using a micropuncture kit and the modified Seldinger technique, access was gained to the right common femoral artery and an 8 French sheath was placed. Real-time ultrasound guidance  was utilized for vascular access including the acquisition of a permanent ultrasound image documenting patency of the accessed vessel. Under fluoroscopy, a Zoom 88 guide catheter was navigated over a 6 [place] Berenstein 2 catheter and a 0.035" Terumo Glidewire into the aortic arch. The catheter was placed into the  left common carotid artery and then advanced into the left internal carotid artery. The Berenstein 2 catheter was removed. Frontal and lateral angiograms of the head were obtained. FINDINGS: 1. Normal caliber of the right common femoral artery. 2. Occlusion of the left MCA at the proximal M1 segment. PROCEDURE: Under biplane roadmap, a zoom 71 aspiration catheter was navigated over an Aristotle 24 microguidewire into the cavernous segment of the left ICA. The aspiration catheter was then advanced to the level of occlusion and connected to an aspiration pump. Continuous aspiration was performed for 2 minutes. The guide catheter was connected to a VacLok syringe and advanced into the M1 segment while the aspiration catheter wasremoved under constant aspiration. The Zoom 88 guide catheter was continuously aspirated until clear blood return was obtained. Left ICA angiograms with frontal and lateral views showed complete left MCA vascular tree recanalization without evidence of thromboembolic complication. Flat panel CT of the head was obtained and post processed in a separate workstation with concurrent attending physician supervision. Selected images were sent to PACS. No evidence of hemorrhagic complication. A right common femoral artery angiogram was then obtained in right anterior oblique view via sheath side port. The puncture is at the level of the common femoral artery which has normal caliber and contour, adequate for closure device. The femoral sheath was exchanged over the wire for a Perclose ProStyle which was utilized for access closure. Immediate hemostasis was achieved. IMPRESSION: Successful and uncomplicated mechanical thrombectomy performed with direct contact aspiration for treatment of a proximal left M1/MCA occlusion with complete recanalization (TICI3). PLAN: 1. Transfer to ICA for continued care. 2. SBP 120-140 mmHg for 24 hours post procedure. 3. Bed rest for 6 hours. Electronically Signed   By:  Pedro Earls M.D.   On: 05/25/2021 17:58   IR US Guide Vasc Access Right  Result Date: 05/25/2021 INDICATION: Deborah Carey is an 70 year-old female with a past medical history significant for osteoarthritis and cataracts who presented emergently to the ED at [facility] due to right side weakness, with eyes drifting to left, and unable to talk with decreased level of consciousness. NIHSS at presentation was 19; baseline modified Rankin scale 0. Head CT showed no evidence of a large acute infarct or hemorrhage (ASPECTS 10). CTA showed a proximal left M1/MCA occlusion with good collaterals. She receive IV TNK and was then transferred to our service for a diagnostic cerebral angiogram and mechanical thrombectomy. EXAM: ULTRASOUND GUIDED VASCULAR ACCESS DIAGNOSTIC CEREBRAL ANGIOGRAM MECHANICAL THROMBECTOMY FLAT PANEL HEAD CT COMPARISON:  CT/CTA 05/25/2021. MEDICATIONS: No antibiotic administered. ANESTHESIA/SEDATION: The procedure was performed under general anesthesia. CONTRAST:  25 mL of Omnipaque 240 mg/mL FLUOROSCOPY TIME:  Fluoroscopy Time: 4 minutes 36 seconds (420 mGy). COMPLICATIONS: None immediate. TECHNIQUE: Informed written consent was obtained from the patient's husband after a thorough discussion of the procedural risks, benefits and alternatives. All questions were addressed. Maximal Sterile Barrier Technique was utilized including caps, mask, sterile gowns, sterile gloves, sterile drape, hand hygiene and skin antiseptic. A timeout was performed prior to the initiation of the procedure. The right groin was prepped and draped in the usual sterile fashion. Using a micropuncture kit and the modified Seldinger technique,  access was gained to the right common femoral artery and an 8 French sheath was placed. Real-time ultrasound guidance was utilized for vascular access including the acquisition of a permanent ultrasound image documenting patency of the accessed vessel. Under fluoroscopy, a Zoom  88 guide catheter was navigated over a 6 [place] Berenstein 2 catheter and a 0.035" Terumo Glidewire into the aortic arch. The catheter was placed into the left common carotid artery and then advanced into the left internal carotid artery. The Berenstein 2 catheter was removed. Frontal and lateral angiograms of the head were obtained. FINDINGS: 1. Normal caliber of the right common femoral artery. 2. Occlusion of the left MCA at the proximal M1 segment. PROCEDURE: Under biplane roadmap, a zoom 71 aspiration catheter was navigated over an Aristotle 24 microguidewire into the cavernous segment of the left ICA. The aspiration catheter was then advanced to the level of occlusion and connected to an aspiration pump. Continuous aspiration was performed for 2 minutes. The guide catheter was connected to a VacLok syringe and advanced into the M1 segment while the aspiration catheter wasremoved under constant aspiration. The Zoom 88 guide catheter was continuously aspirated until clear blood return was obtained. Left ICA angiograms with frontal and lateral views showed complete left MCA vascular tree recanalization without evidence of thromboembolic complication. Flat panel CT of the head was obtained and post processed in a separate workstation with concurrent attending physician supervision. Selected images were sent to PACS. No evidence of hemorrhagic complication. A right common femoral artery angiogram was then obtained in right anterior oblique view via sheath side port. The puncture is at the level of the common femoral artery which has normal caliber and contour, adequate for closure device. The femoral sheath was exchanged over the wire for a Perclose ProStyle which was utilized for access closure. Immediate hemostasis was achieved. IMPRESSION: Successful and uncomplicated mechanical thrombectomy performed with direct contact aspiration for treatment of a proximal left M1/MCA occlusion with complete recanalization  (TICI3). PLAN: 1. Transfer to ICA for continued care. 2. SBP 120-140 mmHg for 24 hours post procedure. 3. Bed rest for 6 hours. Electronically Signed   By: Pedro Earls M.D.   On: 05/25/2021 17:58   DG CHEST PORT 1 VIEW  Result Date: 05/26/2021 CLINICAL DATA:  Status post intubation. EXAM: PORTABLE CHEST 1 VIEW COMPARISON:  05/25/2021 FINDINGS: The ETT tip is 1.2 cm above the carina. Normal heart size. Interval resolution of previous interstitial and airspace opacities. Remote appearing right posterior rib fractures. IMPRESSION: 1. Stable position of ET tube with tip above the carina. 2. Interval clearing of interstitial edema. Electronically Signed   By: Kerby Moors M.D.   On: 05/26/2021 05:59   DG Chest Portable 1 View  Result Date: 05/25/2021 CLINICAL DATA:  Shortness of breath EXAM: PORTABLE CHEST 1 VIEW COMPARISON:  None. FINDINGS: Endotracheal tube terminates proximally 1.5 cm above the carina. Heart size appears mildly enlarged. Diffuse bilateral interstitial prominence. No pleural effusion or pneumothorax. Age indeterminate fractures of the posterior right seventh and likely eighth ribs. IMPRESSION: 1. Endotracheal tube terminates 1.5 cm above the carina. 2. Diffuse bilateral interstitial prominence may reflect atelectasis, edema, versus atypical/viral infection. 3. Age indeterminate fractures of the posterior right seventh and likely eighth ribs. Electronically Signed   By: Davina Poke D.O.   On: 05/25/2021 16:03   IR PERCUTANEOUS ART THROMBECTOMY/INFUSION INTRACRANIAL INC DIAG ANGIO  Result Date: 05/25/2021 INDICATION: Deborah Carey is an 70 year-old female with a past medical  history significant for osteoarthritis and cataracts who presented emergently to the ED at [facility] due to right side weakness, with eyes drifting to left, and unable to talk with decreased level of consciousness. NIHSS at presentation was 19; baseline modified Rankin scale 0. Head CT showed no  evidence of a large acute infarct or hemorrhage (ASPECTS 10). CTA showed a proximal left M1/MCA occlusion with good collaterals. She receive IV TNK and was then transferred to our service for a diagnostic cerebral angiogram and mechanical thrombectomy. EXAM: ULTRASOUND GUIDED VASCULAR ACCESS DIAGNOSTIC CEREBRAL ANGIOGRAM MECHANICAL THROMBECTOMY FLAT PANEL HEAD CT COMPARISON:  CT/CTA 05/25/2021. MEDICATIONS: No antibiotic administered. ANESTHESIA/SEDATION: The procedure was performed under general anesthesia. CONTRAST:  25 mL of Omnipaque 240 mg/mL FLUOROSCOPY TIME:  Fluoroscopy Time: 4 minutes 36 seconds (420 mGy). COMPLICATIONS: None immediate. TECHNIQUE: Informed written consent was obtained from the patient's husband after a thorough discussion of the procedural risks, benefits and alternatives. All questions were addressed. Maximal Sterile Barrier Technique was utilized including caps, mask, sterile gowns, sterile gloves, sterile drape, hand hygiene and skin antiseptic. A timeout was performed prior to the initiation of the procedure. The right groin was prepped and draped in the usual sterile fashion. Using a micropuncture kit and the modified Seldinger technique, access was gained to the right common femoral artery and an 8 French sheath was placed. Real-time ultrasound guidance was utilized for vascular access including the acquisition of a permanent ultrasound image documenting patency of the accessed vessel. Under fluoroscopy, a Zoom 88 guide catheter was navigated over a 6 [place] Berenstein 2 catheter and a 0.035" Terumo Glidewire into the aortic arch. The catheter was placed into the left common carotid artery and then advanced into the left internal carotid artery. The Berenstein 2 catheter was removed. Frontal and lateral angiograms of the head were obtained. FINDINGS: 1. Normal caliber of the right common femoral artery. 2. Occlusion of the left MCA at the proximal M1 segment. PROCEDURE: Under biplane  roadmap, a zoom 71 aspiration catheter was navigated over an Aristotle 24 microguidewire into the cavernous segment of the left ICA. The aspiration catheter was then advanced to the level of occlusion and connected to an aspiration pump. Continuous aspiration was performed for 2 minutes. The guide catheter was connected to a VacLok syringe and advanced into the M1 segment while the aspiration catheter wasremoved under constant aspiration. The Zoom 88 guide catheter was continuously aspirated until clear blood return was obtained. Left ICA angiograms with frontal and lateral views showed complete left MCA vascular tree recanalization without evidence of thromboembolic complication. Flat panel CT of the head was obtained and post processed in a separate workstation with concurrent attending physician supervision. Selected images were sent to PACS. No evidence of hemorrhagic complication. A right common femoral artery angiogram was then obtained in right anterior oblique view via sheath side port. The puncture is at the level of the common femoral artery which has normal caliber and contour, adequate for closure device. The femoral sheath was exchanged over the wire for a Perclose ProStyle which was utilized for access closure. Immediate hemostasis was achieved. IMPRESSION: Successful and uncomplicated mechanical thrombectomy performed with direct contact aspiration for treatment of a proximal left M1/MCA occlusion with complete recanalization (TICI3). PLAN: 1. Transfer to ICA for continued care. 2. SBP 120-140 mmHg for 24 hours post procedure. 3. Bed rest for 6 hours. Electronically Signed   By: Pedro Earls M.D.   On: 05/25/2021 17:58   CT HEAD CODE STROKE  WO CONTRAST  Result Date: 05/25/2021 CLINICAL DATA:  Left-sided weakness EXAM: CT ANGIOGRAPHY HEAD AND NECK TECHNIQUE: Multidetector CT imaging of the head and neck was performed using the standard protocol during bolus administration of  intravenous contrast. Multiplanar CT image reconstructions and MIPs were obtained to evaluate the vascular anatomy. Carotid stenosis measurements (when applicable) are obtained utilizing NASCET criteria, using the distal internal carotid diameter as the denominator. CONTRAST:  64m OMNIPAQUE IOHEXOL 350 MG/ML SOLN COMPARISON:  None. FINDINGS: CT HEAD FINDINGS Brain: There is ill-defined hypodensity in the left centrum semiovale. The left basal ganglia and insular cortex are preserved. The cortex throughout the left MCA distribution is preserved. There is focal hypodensity in the right parietal lobe likely reflecting remote infarct. Additional foci of hypodensity in the bilateral cerebral hemispheres are nonspecific but may reflect sequela of chronic white matter microangiopathy. There is no evidence of acute intracranial hemorrhage or extra-axial fluid collection. The ventricles are not enlarged. There is no mass lesion. There is no midline shift. Vascular: There is a dense MCA on the left. See below. Skull: Normal. Negative for fracture or focal lesion. Sinuses/orbits: The imaged paranasal sinuses are clear. The mastoid air cells are clear. Bilateral lens implants are in place. The globes and orbits are otherwise unremarkable. Review of the MIP images confirms the above findings CTA NECK FINDINGS The CTA images are moderately degraded by motion artifact. Aortic arch: Standard branching. Imaged portion shows no evidence of aneurysm or dissection. No significant stenosis of the major arch vessel origins. Right carotid system: The right carotid system is suboptimally evaluated particularly in the region of the carotid bulb due to motion artifact. The right internal carotid artery has a medialized course. Otherwise, there is no definite focal stenosis, occlusion, dissection, or aneurysm. Left carotid system: The left carotid system is suboptimally evaluated due to motion artifact particularly in the region of the  carotid bulb. There is calcified atherosclerotic plaque of the left carotid bulb without definite hemodynamically significant stenosis or occlusion. There is no definite dissection or aneurysm. Vertebral arteries: The vertebral arteries appear patent, without definite focal stenosis, dissection, occlusion, or aneurysm. Skeleton: There is mild multilevel degenerative change of the cervical spine. There is no acute osseous abnormality or aggressive osseous lesion. Other neck: The soft tissues are unremarkable. Upper chest: There is a 1.1 cm x 1.0 cm soft tissue nodule in the medial right apex. Review of the MIP images confirms the above findings CTA HEAD FINDINGS Anterior circulation: There is calcification of the bilateral cavernous ICAs without focal stenosis or occlusion. There is occlusion of the proximal left M1 segment with reconstitution of flow at the M2 segments. There appears to be good collateral flow throughout the peripheral MCA distribution. The right MCA is patent. The bilateral ACAs are patent. The right A1 segment is diminutive. There is no aneurysm. Posterior circulation: The posterior circulation is suboptimally evaluated due to motion artifact. The V4 segments of the vertebral arteries are patent. The basilar artery is patent. There is multifocal moderate stenosis of the left P2 segment (9-256, 10-116, 10-123). The right PCA is patent.  There is no aneurysm. Venous sinuses: As permitted by contrast timing, patent. Anatomic variants: None. Review of the MIP images confirms the above findings IMPRESSION: 1. Occlusion of the left M1 segment with reconstitution of flow at the M2 segments with good collateral flow throughout the remainder of the MCA distribution. 2. No evidence of left MCA territory infarct at the time of imaging. Aspects is 10.  3. The CTA images are moderately motion degraded. There is no definite high-grade stenosis in the neck. There is multifocal moderate stenosis of the left P2  segment as above. 4. 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 Other foci of hypodensity in the supratentorial white matter likely reflects sequela of chronic white matter microangiopathy. 5. 1.1 cm soft tissue nodule in the medial right apex. Consider one of the following in 3 months for both low-risk and high-risk individuals: (a) repeat chest CT, (b) follow-up PET-CT, or (c) tissue sampling. This recommendation follows the consensus statement: Guidelines for Management of Incidental Pulmonary Nodules Detected on CT Images: From the Fleischner Society 2017; Radiology 2017; 284:228-243. The acute results were called by telephone at the time of interpretation on 05/25/2021 at 3:05 pm to provider Dr Cheral Marker , who verbally acknowledged these results. Electronically Signed   By: Valetta Mole M.D.   On: 05/25/2021 15:22   CT ANGIO HEAD NECK W WO CM (CODE STROKE)  Result Date: 05/25/2021 CLINICAL DATA:  Left-sided weakness EXAM: CT ANGIOGRAPHY HEAD AND NECK TECHNIQUE: Multidetector CT imaging of the head and neck was performed using the standard protocol during bolus administration of intravenous contrast. Multiplanar CT image reconstructions and MIPs were obtained to evaluate the vascular anatomy. Carotid stenosis measurements (when applicable) are obtained utilizing NASCET criteria, using the distal internal carotid diameter as the denominator. CONTRAST:  45m OMNIPAQUE IOHEXOL 350 MG/ML SOLN COMPARISON:  None. FINDINGS: CT HEAD FINDINGS Brain: There is ill-defined hypodensity in the left centrum semiovale. The left basal ganglia and insular cortex are preserved. The cortex throughout the left MCA distribution is preserved. There is focal hypodensity in the right parietal lobe likely reflecting remote infarct. Additional foci of hypodensity in the bilateral cerebral hemispheres are nonspecific but may reflect sequela of chronic white matter microangiopathy. There is no evidence of acute intracranial  hemorrhage or extra-axial fluid collection. The ventricles are not enlarged. There is no mass lesion. There is no midline shift. Vascular: There is a dense MCA on the left. See below. Skull: Normal. Negative for fracture or focal lesion. Sinuses/orbits: The imaged paranasal sinuses are clear. The mastoid air cells are clear. Bilateral lens implants are in place. The globes and orbits are otherwise unremarkable. Review of the MIP images confirms the above findings CTA NECK FINDINGS The CTA images are moderately degraded by motion artifact. Aortic arch: Standard branching. Imaged portion shows no evidence of aneurysm or dissection. No significant stenosis of the major arch vessel origins. Right carotid system: The right carotid system is suboptimally evaluated particularly in the region of the carotid bulb due to motion artifact. The right internal carotid artery has a medialized course. Otherwise, there is no definite focal stenosis, occlusion, dissection, or aneurysm. Left carotid system: The left carotid system is suboptimally evaluated due to motion artifact particularly in the region of the carotid bulb. There is calcified atherosclerotic plaque of the left carotid bulb without definite hemodynamically significant stenosis or occlusion. There is no definite dissection or aneurysm. Vertebral arteries: The vertebral arteries appear patent, without definite focal stenosis, dissection, occlusion, or aneurysm. Skeleton: There is mild multilevel degenerative change of the cervical spine. There is no acute osseous abnormality or aggressive osseous lesion. Other neck: The soft tissues are unremarkable. Upper chest: There is a 1.1 cm x 1.0 cm soft tissue nodule in the medial right apex. Review of the MIP images confirms the above findings CTA HEAD FINDINGS Anterior circulation: There is calcification of the bilateral cavernous ICAs  without focal stenosis or occlusion. There is occlusion of the proximal left M1 segment  with reconstitution of flow at the M2 segments. There appears to be good collateral flow throughout the peripheral MCA distribution. The right MCA is patent. The bilateral ACAs are patent. The right A1 segment is diminutive. There is no aneurysm. Posterior circulation: The posterior circulation is suboptimally evaluated due to motion artifact. The V4 segments of the vertebral arteries are patent. The basilar artery is patent. There is multifocal moderate stenosis of the left P2 segment (9-256, 10-116, 10-123). The right PCA is patent.  There is no aneurysm. Venous sinuses: As permitted by contrast timing, patent. Anatomic variants: None. Review of the MIP images confirms the above findings IMPRESSION: 1. Occlusion of the left M1 segment with reconstitution of flow at the M2 segments with good collateral flow throughout the remainder of the MCA distribution. 2. No evidence of left MCA territory infarct at the time of imaging. Aspects is 10. 3. The CTA images are moderately motion degraded. There is no definite high-grade stenosis in the neck. 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 4. 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 Other foci of hypodensity in the supratentorial white matter likely reflects sequela of chronic white matter microangiopathy. 5. 1.1 cm soft tissue nodule in the medial right apex. Consider one of the following in 3 months for both low-risk and high-risk individuals: (a) repeat chest CT, (b) follow-up PET-CT, or (c) tissue sampling. This recommendation follows the consensus statement: Guidelines for Management of Incidental Pulmonary Nodules Detected on CT Images: From the Fleischner Society 2017; Radiology 2017; 284:228-243. The acute results were called by telephone at the time of interpretation on 05/25/2021 at 3:05 pm to provider Dr Cheral Marker , who verbally acknowledged these results. Electronically Signed   By: Valetta Mole M.D.   On: 05/25/2021 15:22     Labs:  CBC: Recent Labs    05/25/21 1447 05/25/21 2204 05/26/21 0429  WBC 10.0  --  11.0*  HGB 14.8 12.9 13.1  HCT 40.3 38.0 38.6  PLT 267  --  206    COAGS: Recent Labs    05/25/21 1447  INR 0.9  APTT 22*    BMP: Recent Labs    05/25/21 1520 05/25/21 2204 05/26/21 0429  NA 138 138 137  K 4.0 4.1 3.9  CL 102  --  103  CO2 26  --  23  GLUCOSE 128*  --  146*  BUN 18  --  14  CALCIUM 9.4  --  8.9  CREATININE 0.83  --  0.78  GFRNONAA >60  --  >60    LIVER FUNCTION TESTS: Recent Labs    05/25/21 1520 05/26/21 0429  BILITOT 0.8 0.6  AST 20 18  ALT 16 15  ALKPHOS 68 47  PROT 7.3 6.1*  ALBUMIN 4.2 3.4*    Assessment and Plan: Left M1/MCA occlusion s/p mechanical thrombectomy with complete recanalization and TICI3 restoration of flow Patient resting comfortably in bed.  Mild right facial droop.  Expressive aphasia with word substitution. Mild dysarthria.  Mild right pronator drift.  All questions answered by Dr. Karenann Cai.  No needs from NIR.  No follow-up with Dr. Karenann Cai needed.   Electronically Signed: Docia Barrier, PA 05/26/2021, 2:26 PM   I spent a total of 15 Minutes at the the patient's bedside AND on the patient's hospital floor or unit, greater than 50% of which  was counseling/coordinating care for Left MCA occlusion.

## 2021-05-26 NOTE — Progress Notes (Signed)
  Echocardiogram 2D Echocardiogram definity  has been performed.  Deborah Carey M 05/26/2021, 3:19 PM

## 2021-05-26 NOTE — Progress Notes (Signed)
STROKE TEAM PROGRESS NOTE   SUBJECTIVE (INTERVAL HISTORY) Her husband and daughter on at the bedside.  Overall her condition is rapidly improving.  Patient was extubated this morning, reclining in bed, awake alert, mild expressive aphasia and dysarthria, right facial droop with mild right hemiparesis.  BP on the low end, will give IV fluid and albumin.  Speech to follow for swallow evaluation.  Had A. fib on telemetry, confirmed on EKG.  We will have cardiology on board for new diagnosed A. fib.   OBJECTIVE Temp:  [97.3 F (36.3 C)-98.1 F (36.7 C)] 98.1 F (36.7 C) (09/21 1213) Pulse Rate:  [53-126] 78 (09/21 1213) Cardiac Rhythm: Atrial fibrillation (09/21 0800) Resp:  [6-40] 21 (09/21 1213) BP: (95-167)/(49-136) 119/69 (09/21 1213) SpO2:  [82 %-100 %] 100 % (09/21 1213) FiO2 (%):  [40 %-100 %] 40 % (09/21 0721) Weight:  [96 kg-97.1 kg] 96 kg (09/20 1849)  Recent Labs  Lab 05/25/21 1956 05/25/21 2356 05/26/21 0403 05/26/21 0820  GLUCAP 142* 112* 135* 155*   Recent Labs  Lab 05/25/21 1520 05/25/21 1907 05/25/21 2204 05/26/21 0429  NA 138  --  138 137  K 4.0  --  4.1 3.9  CL 102  --   --  103  CO2 26  --   --  23  GLUCOSE 128*  --   --  146*  BUN 18  --   --  14  CREATININE 0.83  --   --  0.78  CALCIUM 9.4  --   --  8.9  MG  --  2.1  --   --   PHOS  --  3.6  --   --    Recent Labs  Lab 05/25/21 1520 05/26/21 0429  AST 20 18  ALT 16 15  ALKPHOS 68 47  BILITOT 0.8 0.6  PROT 7.3 6.1*  ALBUMIN 4.2 3.4*   Recent Labs  Lab 05/25/21 1447 05/25/21 2204 05/26/21 0429  WBC 10.0  --  11.0*  NEUTROABS 5.9  --   --   HGB 14.8 12.9 13.1  HCT 40.3 38.0 38.6  MCV 92.2  --  93.5  PLT 267  --  206   No results for input(s): CKTOTAL, CKMB, CKMBINDEX, TROPONINI in the last 168 hours. Recent Labs    05/25/21 1447  LABPROT 12.6  INR 0.9   No results for input(s): COLORURINE, LABSPEC, PHURINE, GLUCOSEU, HGBUR, BILIRUBINUR, KETONESUR, PROTEINUR, UROBILINOGEN,  NITRITE, LEUKOCYTESUR in the last 72 hours.  Invalid input(s): APPERANCEUR     Component Value Date/Time   CHOL 171 05/26/2021 0429   TRIG 64 05/26/2021 0429   TRIG 66 05/26/2021 0429   HDL 49 05/26/2021 0429   CHOLHDL 3.5 05/26/2021 0429   VLDL 13 05/26/2021 0429   LDLCALC 109 (H) 05/26/2021 0429   Lab Results  Component Value Date   HGBA1C 5.8 (H) 05/26/2021   No results found for: LABOPIA, COCAINSCRNUR, [place], [place], [place], [place]  Recent Labs  Lab 05/25/21 [place] <10    I have personally reviewed the radiological images below and agree with the radiology interpretations.  [place]  Result Date: 05/25/2021 INDICATION: VERNAL RUTAN is an 70 year-old female with a past medical history significant for osteoarthritis and cataracts who presented emergently to the ED at [facility] due to right side weakness, with eyes drifting to left, and unable to talk with decreased level of consciousness. NIHSS at presentation was 19; baseline modified Rankin scale 0. Head  CT showed no evidence of a large acute infarct or hemorrhage (ASPECTS 10). CTA showed a proximal left M1/MCA occlusion with good collaterals. She receive IV TNK and was then transferred to our service for a diagnostic cerebral angiogram and mechanical thrombectomy. EXAM: ULTRASOUND GUIDED VASCULAR ACCESS DIAGNOSTIC CEREBRAL ANGIOGRAM MECHANICAL THROMBECTOMY FLAT PANEL HEAD CT COMPARISON:  CT/CTA 05/25/2021. MEDICATIONS: No antibiotic administered. ANESTHESIA/SEDATION: The procedure was performed under general anesthesia. CONTRAST:  25 mL of Omnipaque 240 mg/mL FLUOROSCOPY TIME:  Fluoroscopy Time: 4 minutes 36 seconds (420 mGy). COMPLICATIONS: None immediate. TECHNIQUE: Informed written consent was obtained from the patient's husband after a thorough discussion of the procedural risks, benefits and alternatives. All questions were addressed. Maximal Sterile Barrier Technique was utilized including caps, mask, sterile gowns,  sterile gloves, sterile drape, hand hygiene and skin antiseptic. A timeout was performed prior to the initiation of the procedure. The right groin was prepped and draped in the usual sterile fashion. Using a micropuncture kit and the modified Seldinger technique, access was gained to the right common femoral artery and an 8 French sheath was placed. Real-time ultrasound guidance was utilized for vascular access including the acquisition of a permanent ultrasound image documenting patency of the accessed vessel. Under fluoroscopy, a Zoom 88 guide catheter was navigated over a 6 [place] Berenstein 2 catheter and a 0.035" Terumo Glidewire into the aortic arch. The catheter was placed into the left common carotid artery and then advanced into the left internal carotid artery. The Berenstein 2 catheter was removed. Frontal and lateral angiograms of the head were obtained. FINDINGS: 1. Normal caliber of the right common femoral artery. 2. Occlusion of the left MCA at the proximal M1 segment. PROCEDURE: Under biplane roadmap, a zoom 71 aspiration catheter was navigated over an Aristotle 24 microguidewire into the cavernous segment of the left ICA. The aspiration catheter was then advanced to the level of occlusion and connected to an aspiration pump. Continuous aspiration was performed for 2 minutes. The guide catheter was connected to a VacLok syringe and advanced into the M1 segment while the aspiration catheter wasremoved under constant aspiration. The Zoom 88 guide catheter was continuously aspirated until clear blood return was obtained. Left ICA angiograms with frontal and lateral views showed complete left MCA vascular tree recanalization without evidence of thromboembolic complication. Flat panel CT of the head was obtained and post processed in a separate workstation with concurrent attending physician supervision. Selected images were sent to PACS. No evidence of hemorrhagic complication. A right common femoral  artery angiogram was then obtained in right anterior oblique view via sheath side port. The puncture is at the level of the common femoral artery which has normal caliber and contour, adequate for closure device. The femoral sheath was exchanged over the wire for a Perclose ProStyle which was utilized for access closure. Immediate hemostasis was achieved. IMPRESSION: Successful and uncomplicated mechanical thrombectomy performed with direct contact aspiration for treatment of a proximal left M1/MCA occlusion with complete recanalization (TICI3). PLAN: 1. Transfer to ICA for continued care. 2. SBP 120-140 mmHg for 24 hours post procedure. 3. Bed rest for 6 hours. Electronically Signed   By: Pedro Earls M.D.   On: 05/25/2021 17:58   IR US Guide Vasc Access Right  Result Date: 05/25/2021 INDICATION: TAWANNA FUNK is an 70 year-old female with a past medical history significant for osteoarthritis and cataracts who presented emergently to the ED at [name] due to right side weakness, with eyes drifting to left, and  unable to talk with decreased level of consciousness. NIHSS at presentation was 19; baseline modified Rankin scale 0. Head CT showed no evidence of a large acute infarct or hemorrhage (ASPECTS 10). CTA showed a proximal left M1/MCA occlusion with good collaterals. She receive IV TNK and was then transferred to our service for a diagnostic cerebral angiogram and mechanical thrombectomy. EXAM: ULTRASOUND GUIDED VASCULAR ACCESS DIAGNOSTIC CEREBRAL ANGIOGRAM MECHANICAL THROMBECTOMY FLAT PANEL HEAD CT COMPARISON:  CT/CTA 05/25/2021. MEDICATIONS: No antibiotic administered. ANESTHESIA/SEDATION: The procedure was performed under general anesthesia. CONTRAST:  25 mL of Omnipaque 240 mg/mL FLUOROSCOPY TIME:  Fluoroscopy Time: 4 minutes 36 seconds (420 mGy). COMPLICATIONS: None immediate. TECHNIQUE: Informed written consent was obtained from the patient's husband after a thorough discussion of the  procedural risks, benefits and alternatives. All questions were addressed. Maximal Sterile Barrier Technique was utilized including caps, mask, sterile gowns, sterile gloves, sterile drape, hand hygiene and skin antiseptic. A timeout was performed prior to the initiation of the procedure. The right groin was prepped and draped in the usual sterile fashion. Using a micropuncture kit and the modified Seldinger technique, access was gained to the right common femoral artery and an 8 French sheath was placed. Real-time ultrasound guidance was utilized for vascular access including the acquisition of a permanent ultrasound image documenting patency of the accessed vessel. Under fluoroscopy, a Zoom 88 guide catheter was navigated over a 6 [place] Berenstein 2 catheter and a 0.035" Terumo Glidewire into the aortic arch. The catheter was placed into the left common carotid artery and then advanced into the left internal carotid artery. The Berenstein 2 catheter was removed. Frontal and lateral angiograms of the head were obtained. FINDINGS: 1. Normal caliber of the right common femoral artery. 2. Occlusion of the left MCA at the proximal M1 segment. PROCEDURE: Under biplane roadmap, a zoom 71 aspiration catheter was navigated over an Aristotle 24 microguidewire into the cavernous segment of the left ICA. The aspiration catheter was then advanced to the level of occlusion and connected to an aspiration pump. Continuous aspiration was performed for 2 minutes. The guide catheter was connected to a VacLok syringe and advanced into the M1 segment while the aspiration catheter wasremoved under constant aspiration. The Zoom 88 guide catheter was continuously aspirated until clear blood return was obtained. Left ICA angiograms with frontal and lateral views showed complete left MCA vascular tree recanalization without evidence of thromboembolic complication. Flat panel CT of the head was obtained and post processed in a separate  workstation with concurrent attending physician supervision. Selected images were sent to PACS. No evidence of hemorrhagic complication. A right common femoral artery angiogram was then obtained in right anterior oblique view via sheath side port. The puncture is at the level of the common femoral artery which has normal caliber and contour, adequate for closure device. The femoral sheath was exchanged over the wire for a Perclose ProStyle which was utilized for access closure. Immediate hemostasis was achieved. IMPRESSION: Successful and uncomplicated mechanical thrombectomy performed with direct contact aspiration for treatment of a proximal left M1/MCA occlusion with complete recanalization (TICI3). PLAN: 1. Transfer to ICA for continued care. 2. SBP 120-140 mmHg for 24 hours post procedure. 3. Bed rest for 6 hours. Electronically Signed   By: Pedro Earls M.D.   On: 05/25/2021 17:58   DG CHEST PORT 1 VIEW  Result Date: 05/26/2021 CLINICAL DATA:  Status post intubation. EXAM: PORTABLE CHEST 1 VIEW COMPARISON:  05/25/2021 FINDINGS: The ETT tip is 1.2  cm above the carina. Normal heart size. Interval resolution of previous interstitial and airspace opacities. Remote appearing right posterior rib fractures. IMPRESSION: 1. Stable position of ET tube with tip above the carina. 2. Interval clearing of interstitial edema. Electronically Signed   By: Kerby Moors M.D.   On: 05/26/2021 05:59   DG Chest Portable 1 View  Result Date: 05/25/2021 CLINICAL DATA:  Shortness of breath EXAM: PORTABLE CHEST 1 VIEW COMPARISON:  None. FINDINGS: Endotracheal tube terminates proximally 1.5 cm above the carina. Heart size appears mildly enlarged. Diffuse bilateral interstitial prominence. No pleural effusion or pneumothorax. Age indeterminate fractures of the posterior right seventh and likely eighth ribs. IMPRESSION: 1. Endotracheal tube terminates 1.5 cm above the carina. 2. Diffuse bilateral interstitial  prominence may reflect atelectasis, edema, versus atypical/viral infection. 3. Age indeterminate fractures of the posterior right seventh and likely eighth ribs. Electronically Signed   By: Davina Poke D.O.   On: 05/25/2021 16:03   IR PERCUTANEOUS ART THROMBECTOMY/INFUSION INTRACRANIAL INC DIAG ANGIO  Result Date: 05/25/2021 INDICATION: BAMBI FEHNEL is an 70 year-old female with a past medical history significant for osteoarthritis and cataracts who presented emergently to the ED at [facility] due to right side weakness, with eyes drifting to left, and unable to talk with decreased level of consciousness. NIHSS at presentation was 19; baseline modified Rankin scale 0. Head CT showed no evidence of a large acute infarct or hemorrhage (ASPECTS 10). CTA showed a proximal left M1/MCA occlusion with good collaterals. She receive IV TNK and was then transferred to our service for a diagnostic cerebral angiogram and mechanical thrombectomy. EXAM: ULTRASOUND GUIDED VASCULAR ACCESS DIAGNOSTIC CEREBRAL ANGIOGRAM MECHANICAL THROMBECTOMY FLAT PANEL HEAD CT COMPARISON:  CT/CTA 05/25/2021. MEDICATIONS: No antibiotic administered. ANESTHESIA/SEDATION: The procedure was performed under general anesthesia. CONTRAST:  25 mL of Omnipaque 240 mg/mL FLUOROSCOPY TIME:  Fluoroscopy Time: 4 minutes 36 seconds (420 mGy). COMPLICATIONS: None immediate. TECHNIQUE: Informed written consent was obtained from the patient's husband after a thorough discussion of the procedural risks, benefits and alternatives. All questions were addressed. Maximal Sterile Barrier Technique was utilized including caps, mask, sterile gowns, sterile gloves, sterile drape, hand hygiene and skin antiseptic. A timeout was performed prior to the initiation of the procedure. The right groin was prepped and draped in the usual sterile fashion. Using a micropuncture kit and the modified Seldinger technique, access was gained to the right common femoral artery and an 8  French sheath was placed. Real-time ultrasound guidance was utilized for vascular access including the acquisition of a permanent ultrasound image documenting patency of the accessed vessel. Under fluoroscopy, a Zoom 88 guide catheter was navigated over a 6 [place] Berenstein 2 catheter and a 0.035" Terumo Glidewire into the aortic arch. The catheter was placed into the left common carotid artery and then advanced into the left internal carotid artery. The Berenstein 2 catheter was removed. Frontal and lateral angiograms of the head were obtained. FINDINGS: 1. Normal caliber of the right common femoral artery. 2. Occlusion of the left MCA at the proximal M1 segment. PROCEDURE: Under biplane roadmap, a zoom 71 aspiration catheter was navigated over an Aristotle 24 microguidewire into the cavernous segment of the left ICA. The aspiration catheter was then advanced to the level of occlusion and connected to an aspiration pump. Continuous aspiration was performed for 2 minutes. The guide catheter was connected to a VacLok syringe and advanced into the M1 segment while the aspiration catheter wasremoved under constant aspiration. The Zoom 88 guide  catheter was continuously aspirated until clear blood return was obtained. Left ICA angiograms with frontal and lateral views showed complete left MCA vascular tree recanalization without evidence of thromboembolic complication. Flat panel CT of the head was obtained and post processed in a separate workstation with concurrent attending physician supervision. Selected images were sent to PACS. No evidence of hemorrhagic complication. A right common femoral artery angiogram was then obtained in right anterior oblique view via sheath side port. The puncture is at the level of the common femoral artery which has normal caliber and contour, adequate for closure device. The femoral sheath was exchanged over the wire for a Perclose ProStyle which was utilized for access closure.  Immediate hemostasis was achieved. IMPRESSION: Successful and uncomplicated mechanical thrombectomy performed with direct contact aspiration for treatment of a proximal left M1/MCA occlusion with complete recanalization (TICI3). PLAN: 1. Transfer to ICA for continued care. 2. SBP 120-140 mmHg for 24 hours post procedure. 3. Bed rest for 6 hours. Electronically Signed   By: Pedro Earls M.D.   On: 05/25/2021 17:58   CT HEAD CODE STROKE WO CONTRAST  Result Date: 05/25/2021 CLINICAL DATA:  Left-sided weakness EXAM: CT ANGIOGRAPHY HEAD AND NECK TECHNIQUE: Multidetector CT imaging of the head and neck was performed using the standard protocol during bolus administration of intravenous contrast. Multiplanar CT image reconstructions and MIPs were obtained to evaluate the vascular anatomy. Carotid stenosis measurements (when applicable) are obtained utilizing NASCET criteria, using the distal internal carotid diameter as the denominator. CONTRAST:  97m OMNIPAQUE IOHEXOL 350 MG/ML SOLN COMPARISON:  None. FINDINGS: CT HEAD FINDINGS Brain: There is ill-defined hypodensity in the left centrum semiovale. The left basal ganglia and insular cortex are preserved. The cortex throughout the left MCA distribution is preserved. There is focal hypodensity in the right parietal lobe likely reflecting remote infarct. Additional foci of hypodensity in the bilateral cerebral hemispheres are nonspecific but may reflect sequela of chronic white matter microangiopathy. There is no evidence of acute intracranial hemorrhage or extra-axial fluid collection. The ventricles are not enlarged. There is no mass lesion. There is no midline shift. Vascular: There is a dense MCA on the left. See below. Skull: Normal. Negative for fracture or focal lesion. Sinuses/orbits: The imaged paranasal sinuses are clear. The mastoid air cells are clear. Bilateral lens implants are in place. The globes and orbits are otherwise unremarkable.  Review of the MIP images confirms the above findings CTA NECK FINDINGS The CTA images are moderately degraded by motion artifact. Aortic arch: Standard branching. Imaged portion shows no evidence of aneurysm or dissection. No significant stenosis of the major arch vessel origins. Right carotid system: The right carotid system is suboptimally evaluated particularly in the region of the carotid bulb due to motion artifact. The right internal carotid artery has a medialized course. Otherwise, there is no definite focal stenosis, occlusion, dissection, or aneurysm. Left carotid system: The left carotid system is suboptimally evaluated due to motion artifact particularly in the region of the carotid bulb. There is calcified atherosclerotic plaque of the left carotid bulb without definite hemodynamically significant stenosis or occlusion. There is no definite dissection or aneurysm. Vertebral arteries: The vertebral arteries appear patent, without definite focal stenosis, dissection, occlusion, or aneurysm. Skeleton: There is mild multilevel degenerative change of the cervical spine. There is no acute osseous abnormality or aggressive osseous lesion. Other neck: The soft tissues are unremarkable. Upper chest: There is a 1.1 cm x 1.0 cm soft tissue nodule in the medial  right apex. Review of the MIP images confirms the above findings CTA HEAD FINDINGS Anterior circulation: There is calcification of the bilateral cavernous ICAs without focal stenosis or occlusion. There is occlusion of the proximal left M1 segment with reconstitution of flow at the M2 segments. There appears to be good collateral flow throughout the peripheral MCA distribution. The right MCA is patent. The bilateral ACAs are patent. The right A1 segment is diminutive. There is no aneurysm. Posterior circulation: The posterior circulation is suboptimally evaluated due to motion artifact. The V4 segments of the vertebral arteries are patent. The basilar artery  is patent. There is multifocal moderate stenosis of the left P2 segment (9-256, 10-116, 10-123). The right PCA is patent.  There is no aneurysm. Venous sinuses: As permitted by contrast timing, patent. Anatomic variants: None. Review of the MIP images confirms the above findings IMPRESSION: 1. Occlusion of the left M1 segment with reconstitution of flow at the M2 segments with good collateral flow throughout the remainder of the MCA distribution. 2. No evidence of left MCA territory infarct at the time of imaging. Aspects is 10. 3. The CTA images are moderately motion degraded. There is no definite high-grade stenosis in the neck. 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 4. 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 Other foci of hypodensity in the supratentorial white matter likely reflects sequela of chronic white matter microangiopathy. 5. 1.1 cm soft tissue nodule in the medial right apex. Consider one of the following in 3 months for both low-risk and high-risk individuals: (a) repeat chest CT, (b) follow-up PET-CT, or (c) tissue sampling. This recommendation follows the consensus statement: Guidelines for Management of Incidental Pulmonary Nodules Detected on CT Images: From the Fleischner Society 2017; Radiology 2017; 284:228-243. The acute results were called by telephone at the time of interpretation on 05/25/2021 at 3:05 pm to provider Dr Cheral Marker , who verbally acknowledged these results. Electronically Signed   By: Valetta Mole M.D.   On: 05/25/2021 15:22   CT ANGIO HEAD NECK W WO CM (CODE STROKE)  Result Date: 05/25/2021 CLINICAL DATA:  Left-sided weakness EXAM: CT ANGIOGRAPHY HEAD AND NECK TECHNIQUE: Multidetector CT imaging of the head and neck was performed using the standard protocol during bolus administration of intravenous contrast. Multiplanar CT image reconstructions and MIPs were obtained to evaluate the vascular anatomy. Carotid stenosis measurements  (when applicable) are obtained utilizing NASCET criteria, using the distal internal carotid diameter as the denominator. CONTRAST:  22m OMNIPAQUE IOHEXOL 350 MG/ML SOLN COMPARISON:  None. FINDINGS: CT HEAD FINDINGS Brain: There is ill-defined hypodensity in the left centrum semiovale. The left basal ganglia and insular cortex are preserved. The cortex throughout the left MCA distribution is preserved. There is focal hypodensity in the right parietal lobe likely reflecting remote infarct. Additional foci of hypodensity in the bilateral cerebral hemispheres are nonspecific but may reflect sequela of chronic white matter microangiopathy. There is no evidence of acute intracranial hemorrhage or extra-axial fluid collection. The ventricles are not enlarged. There is no mass lesion. There is no midline shift. Vascular: There is a dense MCA on the left. See below. Skull: Normal. Negative for fracture or focal lesion. Sinuses/orbits: The imaged paranasal sinuses are clear. The mastoid air cells are clear. Bilateral lens implants are in place. The globes and orbits are otherwise unremarkable. Review of the MIP images confirms the above findings CTA NECK FINDINGS The CTA images are moderately degraded by motion artifact. Aortic arch: Standard  branching. Imaged portion shows no evidence of aneurysm or dissection. No significant stenosis of the major arch vessel origins. Right carotid system: The right carotid system is suboptimally evaluated particularly in the region of the carotid bulb due to motion artifact. The right internal carotid artery has a medialized course. Otherwise, there is no definite focal stenosis, occlusion, dissection, or aneurysm. Left carotid system: The left carotid system is suboptimally evaluated due to motion artifact particularly in the region of the carotid bulb. There is calcified atherosclerotic plaque of the left carotid bulb without definite hemodynamically significant stenosis or occlusion.  There is no definite dissection or aneurysm. Vertebral arteries: The vertebral arteries appear patent, without definite focal stenosis, dissection, occlusion, or aneurysm. Skeleton: There is mild multilevel degenerative change of the cervical spine. There is no acute osseous abnormality or aggressive osseous lesion. Other neck: The soft tissues are unremarkable. Upper chest: There is a 1.1 cm x 1.0 cm soft tissue nodule in the medial right apex. Review of the MIP images confirms the above findings CTA HEAD FINDINGS Anterior circulation: There is calcification of the bilateral cavernous ICAs without focal stenosis or occlusion. There is occlusion of the proximal left M1 segment with reconstitution of flow at the M2 segments. There appears to be good collateral flow throughout the peripheral MCA distribution. The right MCA is patent. The bilateral ACAs are patent. The right A1 segment is diminutive. There is no aneurysm. Posterior circulation: The posterior circulation is suboptimally evaluated due to motion artifact. The V4 segments of the vertebral arteries are patent. The basilar artery is patent. There is multifocal moderate stenosis of the left P2 segment (9-256, 10-116, 10-123). The right PCA is patent.  There is no aneurysm. Venous sinuses: As permitted by contrast timing, patent. Anatomic variants: None. Review of the MIP images confirms the above findings IMPRESSION: 1. Occlusion of the left M1 segment with reconstitution of flow at the M2 segments with good collateral flow throughout the remainder of the MCA distribution. 2. No evidence of left MCA territory infarct at the time of imaging. Aspects is 10. 3. The CTA images are moderately motion degraded. There is no definite high-grade stenosis in the neck. 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 4. 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 Other foci of hypodensity in the supratentorial white matter likely reflects  sequela of chronic white matter microangiopathy. 5. 1.1 cm soft tissue nodule in the medial right apex. Consider one of the following in 3 months for both low-risk and high-risk individuals: (a) repeat chest CT, (b) follow-up PET-CT, or (c) tissue sampling. This recommendation follows the consensus statement: Guidelines for Management of Incidental Pulmonary Nodules Detected on CT Images: From the Fleischner Society 2017; Radiology 2017; 284:228-243. The acute results were called by telephone at the time of interpretation on 05/25/2021 at 3:05 pm to provider Dr Cheral Marker , who verbally acknowledged these results. Electronically Signed   By: Valetta Mole M.D.   On: 05/25/2021 15:22     PHYSICAL EXAM  Temp:  [97.3 F (36.3 C)-98.1 F (36.7 C)] 98.1 F (36.7 C) (09/21 1213) Pulse Rate:  [53-126] 78 (09/21 1213) Resp:  [6-40] 21 (09/21 1213) BP: (95-167)/(49-136) 119/69 (09/21 1213) SpO2:  [82 %-100 %] 100 % (09/21 1213) FiO2 (%):  [40 %-100 %] 40 % (09/21 0721) Weight:  [96 kg-97.1 kg] 96 kg (09/20 1849)  General - Well nourished, well developed, in no apparent distress.  Ophthalmologic - fundi not visualized  due to noncooperation.  Cardiovascular - irregularly irregular heart rate and rhythm.  Neuro - awake, alert, eyes open, orientated to place and people, not only keep to age or time.  Mild expressive aphasia with frequent word finding difficulty and paraphasic errors, however, following all simple commands. Able to repeat in dysarthric voice.  Naming 2/4.  Mild to moderate dysarthria.  No gaze palsy, tracking bilaterally, blinking to visual threat bilaterally, PERRL.  Right facial droop. Tongue midline.  Left upper extremity 4/5 and lower extremity 3/5 proximal and 4/5 distal.  Right upper extremity no drift however 4-/5 finger grip and decreased dexterity.  Right lower extremity proximal 3/5, knee flexion and ankle DF/PF 3/5.  Sensation symmetrical bilaterally, seems to have right sensation  inattention, b/l FTN intact although slow on the right, gait not tested.    ASSESSMENT/PLAN Ms. ALVEDA VANHORNE is a 70 y.o. female with history of anxiety and HLD admitted for right-sided weakness, left gaze, aphasia and altered mental status. TNK given.    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 fib CT head no acute abnormality, old right parietal small infarct CT head and neck left M1 occlusion left M2 reconstitution.  Left P2 stenosis MRI left MCA infarcts more prominent at left BG and caudate head, right MCA and ACA punctate infarcts MRA left MCA patent now 2D Echo EF 50 to 55%, left atrial size normal LDL 109 HgbA1c 5.8 SCDs for VTE prophylaxis No antithrombotic prior to admission, now on No antithrombotic within 24 hours of TNK Ongoing aggressive stroke risk factor management Therapy recommendations: Pending Disposition: Pending  A. Fib New diagnosis EKG confirmed Cardiology consulted, appreciate assistance Rate controlled  Hypotension BP on lower end On IV fluid, will add albumin every 6 hours for 4 doses BP goal 120-140 within 24 hours of IR Long term BP goal normotensive  Hyperlipidemia Home meds: None LDL 109, goal < 70 Will start Lipitor once p.o. access Continue statin at discharge  Dysphagia Did not pass bedside swallow N.p.o. now IV fluid Speech on board  Other Stroke Risk Factors Advanced age Obesity, Body mass index is 36.33 kg/m.  Hx stroke/TIA on imaging  Other Active Problems CT head and neck showed right lung apex 1cm soft tissue nodule - need outpatient follow-up  Hospital day # 1  This patient is critically ill due to left MCA infarct status post TNK and thrombectomy, new onset A. fib, hypotension and at significant risk of neurological worsening, death form recurrent stroke, hemorrhagic conversion, seizure, bleeding  from TNK, heart failure. This patient's care requires constant monitoring of vital signs, hemodynamics, respiratory and cardiac monitoring, review of multiple databases, neurological assessment, discussion with family, other specialists and medical decision making of high complexity. I spent 45 minutes of neurocritical care time in the care of this patient. I had long discussion with husband and daughter at bedside, updated pt current condition, treatment plan and potential prognosis, and answered all the questions.  They expressed understanding and appreciation.     Rosalin Hawking, MD PhD Stroke Neurology 05/26/2021 12:43 PM    To contact Stroke Continuity provider, please refer to [email or web address] After hours, contact General Neurology

## 2021-05-26 NOTE — Progress Notes (Addendum)
[facility] Progress Note Patient Name: Deborah Carey DOB: [birth year] MRN: [id number]   Date of Service  05/26/2021  HPI/Events of Note  Pt vomiting around OGT  eICU Interventions  Reglan started; keep NPO; low intermittent suction     Intervention Category Intermediate Interventions: Other:  Tilden Dome 05/26/2021, 3:48 AM

## 2021-05-26 NOTE — Anesthesia Preprocedure Evaluation (Addendum)
Anesthesia Evaluation   Patient unresponsive  Preop documentation limited or incomplete due to emergent nature of procedure.  History of Anesthesia Complications (+) PONV  Airway Mallampati: Intubated       Dental no notable dental hx.    Pulmonary       + intubated    Cardiovascular negative cardio ROS   Rhythm:Regular Rate:Tachycardia     Neuro/Psych Anxiety CVA    GI/Hepatic negative GI ROS, Neg liver ROS,   Endo/Other  negative endocrine ROS  Renal/GU negative Renal ROS  negative genitourinary   Musculoskeletal  (+) Arthritis , Osteoarthritis,    Abdominal   Peds  Hematology   Anesthesia Other Findings   Reproductive/Obstetrics                             Anesthesia Physical Anesthesia Plan  ASA: 4 and emergent  Anesthesia Plan: General   Post-op Pain Management:    Induction: Intravenous  PONV Risk Score and Plan: 4 or greater and Treatment may vary due to age or medical condition, Ondansetron, Dexamethasone and Propofol infusion  Airway Management Planned: Oral ETT  Additional Equipment: None  Intra-op Plan:   Post-operative Plan: Post-operative intubation/ventilation  Informed Consent:     Only emergency history available  Plan Discussed with: CRNA  Anesthesia Plan Comments:         Anesthesia Quick Evaluation

## 2021-05-26 NOTE — Progress Notes (Signed)
Inpatient Rehab Admissions Coordinator Note:   Per PT recommendations patient was screened for CIR candidacy by Michel Santee, PT. At this time, pt appears to be a potential candidate for CIR. I will place an order for rehab consult for full assessment, per our protocol.  Please contact me any with questions.Shann Medal, PT, DPT [phone] 05/26/21 1:09 PM

## 2021-05-26 NOTE — Progress Notes (Signed)
NAME:  Deborah Carey, MRN:  [id number], DOB:  [birth year], LOS: 1 ADMISSION DATE:  05/25/2021, CONSULTATION DATE:  05/26/21 REFERRING MD: Theda Sers , CHIEF COMPLAINT:  R sided weakness, stroke   History of Present Illness:  Deborah Carey is a 70 y.o. F with PMH of cataracts, anxiety, osteoarthritis who was at the cancer center with her husband when she started having right-sided weakness with left eye deviation and aphasia.  She was brought to the ED as a code stroke.  She is not on a blood thinner and no history of stroke risk factors.  Pt presented to [facility] ED and received TNK intubated for worsening mental status, transferred emergently to [facility].  She was taken emergently to CT and started on Cleviprex for hypertension.  CTA with left M1/MCA occlusion and she was taken emergently to IR for mechanical thrombectomy which resulted in complete recanalization.  She was intubated prior to the procedure   Significant Hospital Events: Including procedures, antibiotic start and stop dates in addition to other pertinent events   9/20 Acute R sided weakness, code stroke, TNK and to IR for emergent embolization of L  M1/MCA occlusion.  ETT and PCCM consult  Interim History / Subjective:  Sedation was turned off this morning, patient is tolerating spontaneous breathing trial She vomited x2 this morning  Objective   Blood pressure 131/74, pulse 69, temperature 97.8 F (36.6 C), temperature source Axillary, resp. rate 15, height 5\' 4"  (1.626 m), weight 96 kg, last menstrual period 09/05/2008, SpO2 99 %.    Vent Mode: PSV;CPAP FiO2 (%):  [40 %-100 %] 40 % Set Rate:  [16 bmp] 16 bmp Vt Set:  [430 mL] 430 mL PEEP:  [5 cmH20] 5 cmH20 Pressure Support:  [8 cmH20] 8 cmH20 Plateau Pressure:  [11 cmH20-17 cmH20] 11 cmH20   Intake/Output Summary (Last 24 hours) at 05/26/2021 0931 Last data filed at 05/26/2021 0800 Gross per 24 hour  Intake 1283.87 ml  Output 550 ml  Net 733.87 ml   Filed Weights    05/25/21 1849  Weight: 96 kg      Physical exam: General: Acutely ill-appearing elderly Caucasian female, orally intubated HEENT: [place]/AT, eyes anicteric.  ETT in place Neuro: Awake, following commands, 3/5 on right upper and right lower extremity 5/5 on left side.  Cranial nerves are intact Chest: Coarse breath sounds, no wheezes or rhonchi Heart: Regular rate and rhythm, no murmurs or gallops Abdomen: Soft, nontender, nondistended, bowel sounds present Skin: No rash  Resolved Hospital Problem list     Assessment & Plan:   Acute respiratory insufficiency secondary to acute encephalopathy in the setting of CVA Continue lung protective ventilation Patient is tolerating spontaneous breathing trial Stop sedation Watch for respiratory distress   Acute left MCA stroke s/p TNK and mechanical thrombectomy with complete revascularization with TICI3 Likely secondary to paroxysmal A. fib Continue neuro watch every hour Continue secondary stroke prophylaxis Stroke team is following SBP goal 120-140 for 24-hour post thrombectomy MRI and echocardiogram is pending LDL is 109, needs high-dose statin, A1c is 5.8 PT/ OT/ SLP when able    Hypertensive emergency  Patient blood pressure is well controlled She is off clevidipine Resume home antihypertensives   Paroxysmal atrial fibrillation New diagnosis of atrial fibrillation, patient has converted to sinus rhythm Her CHA2DS2-VASc score is 5 Defer to neurology when to start anticoagulation for secondary stroke prophylaxis  Best Practice (right click and "Reselect all SmartList Selections" daily)   Diet/type: NPO swallow evaluation after  extubation DVT prophylaxis: SCD GI prophylaxis: PPI Lines: N/A Foley:  Yes, and it is still needed Code Status:  full code Last date of multidisciplinary goals of care discussion [per primary]  Labs   CBC: Recent Labs  Lab 05/25/21 1447 05/25/21 2204 05/26/21 0429  WBC 10.0  --  11.0*   NEUTROABS 5.9  --   --   HGB 14.8 12.9 13.1  HCT 40.3 38.0 38.6  MCV 92.2  --  93.5  PLT 267  --  546    Basic Metabolic Panel: Recent Labs  Lab 05/25/21 1520 05/25/21 1907 05/25/21 2204 05/26/21 0429  NA 138  --  138 137  K 4.0  --  4.1 3.9  CL 102  --   --  103  CO2 26  --   --  23  GLUCOSE 128*  --   --  146*  BUN 18  --   --  14  CREATININE 0.83  --   --  0.78  CALCIUM 9.4  --   --  8.9  MG  --  2.1  --   --   PHOS  --  3.6  --   --    GFR: Estimated Creatinine Clearance: 71.4 mL/min (by C-G formula based on SCr of 0.78 mg/dL). Recent Labs  Lab 05/25/21 1447 05/26/21 0429  WBC 10.0 11.0*    Liver Function Tests: Recent Labs  Lab 05/25/21 1520 05/26/21 0429  AST 20 18  ALT 16 15  ALKPHOS 68 47  BILITOT 0.8 0.6  PROT 7.3 6.1*  ALBUMIN 4.2 3.4*   No results for input(s): LIPASE, AMYLASE in the last 168 hours. No results for input(s): AMMONIA in the last 168 hours.  ABG    Component Value Date/Time   PHART 7.386 05/25/2021 2204   PCO2ART 52.1 (H) 05/25/2021 2204   PO2ART 483 (H) 05/25/2021 2204   HCO3 31.5 (H) 05/25/2021 2204   TCO2 33 (H) 05/25/2021 2204   O2SAT 100.0 05/25/2021 2204     Coagulation Profile: Recent Labs  Lab 05/25/21 1447  INR 0.9    Cardiac Enzymes: No results for input(s): CKTOTAL, CKMB, CKMBINDEX, TROPONINI in the last 168 hours.  HbA1C: Hgb A1c MFr Bld  Date/Time Value Ref Range Status  05/26/2021 04:29 AM 5.8 (H) 4.8 - 5.6 % Final    Comment:    (NOTE) Pre diabetes:          5.7%-6.4%  Diabetes:              >6.4%  Glycemic control for   <7.0% adults with diabetes   05/25/2020 10:15 AM 6.4 4.6 - 6.5 % Final    Comment:    Glycemic Control Guidelines for People with Diabetes:Non Diabetic:  <6%Goal of Therapy: <7%Additional Action Suggested:  >8%     CBG: Recent Labs  Lab 05/25/21 1956 05/25/21 2356  GLUCAP 142* 112*   Total critical care time: 36 minutes  Performed by: [facility]  care time was exclusive of separately billable procedures and treating other patients.   Critical care was necessary to treat or prevent imminent or life-threatening deterioration.   Critical care was time spent personally by me on the following activities: development of treatment plan with patient and/or surrogate as well as nursing, discussions with consultants, evaluation of patient's response to treatment, examination of patient, obtaining history from patient or surrogate, ordering and performing treatments and interventions, ordering and review of laboratory studies, ordering and review  of radiographic studies, pulse oximetry and re-evaluation of patient's condition.   Jacky Kindle MD [facility] See Amion for pager If no response to pager, please call [phone] until 7pm After 7pm, Please call E-link [phone]

## 2021-05-27 ENCOUNTER — Inpatient Hospital Stay (HOSPITAL_COMMUNITY): Payer: Medicare PPO

## 2021-05-27 ENCOUNTER — Other Ambulatory Visit: Payer: Medicare PPO

## 2021-05-27 DIAGNOSIS — R1312 Dysphagia, oropharyngeal phase: Secondary | ICD-10-CM

## 2021-05-27 DIAGNOSIS — I480 Paroxysmal atrial fibrillation: Secondary | ICD-10-CM

## 2021-05-27 DIAGNOSIS — I63412 Cerebral infarction due to embolism of left middle cerebral artery: Secondary | ICD-10-CM | POA: Diagnosis not present

## 2021-05-27 DIAGNOSIS — I63312 Cerebral infarction due to thrombosis of left middle cerebral artery: Secondary | ICD-10-CM | POA: Diagnosis not present

## 2021-05-27 DIAGNOSIS — E7800 Pure hypercholesterolemia, unspecified: Secondary | ICD-10-CM | POA: Diagnosis not present

## 2021-05-27 DIAGNOSIS — R0689 Other abnormalities of breathing: Secondary | ICD-10-CM | POA: Diagnosis not present

## 2021-05-27 LAB — BASIC METABOLIC PANEL
Anion gap: 7 (ref 5–15)
BUN: 12 mg/dL (ref 8–23)
CO2: 27 mmol/L (ref 22–32)
Calcium: 8.7 mg/dL — ABNORMAL LOW (ref 8.9–10.3)
Chloride: 105 mmol/L (ref 98–111)
Creatinine, Ser: 0.77 mg/dL (ref 0.44–1.00)
GFR, Estimated: >60 mL/min (ref >60)
Glucose, Bld: 90 mg/dL (ref 70–99)
Potassium: 4 mmol/L (ref 3.5–5.1)
Sodium: 139 mmol/L (ref 135–145)

## 2021-05-27 LAB — CBC
HCT: 34.5 % — ABNORMAL LOW (ref 36.0–46.0)
Hemoglobin: 11.6 g/dL — ABNORMAL LOW (ref 12.0–15.0)
MCH: 31.9 pg (ref 26.0–34.0)
MCHC: 33.6 g/dL (ref 30.0–36.0)
MCV: 94.8 fL (ref 80.0–100.0)
Platelets: 177 10*3/uL (ref 150–400)
RBC: 3.64 MIL/uL — ABNORMAL LOW (ref 3.87–5.11)
RDW: 12.3 % (ref 11.5–15.5)
WBC: 8.1 10*3/uL (ref 4.0–10.5)
nRBC: 0 % (ref 0.0–0.2)

## 2021-05-27 LAB — GLUCOSE, CAPILLARY
Glucose-Capillary: 115 mg/dL — ABNORMAL HIGH (ref 70–99)
Glucose-Capillary: 77 mg/dL (ref 70–99)
Glucose-Capillary: 88 mg/dL (ref 70–99)
Glucose-Capillary: 89 mg/dL (ref 70–99)
Glucose-Capillary: 92 mg/dL (ref 70–99)
Glucose-Capillary: 99 mg/dL (ref 70–99)
Glucose-Capillary: 90 mg/dL (ref 70–99)
Glucose-Capillary: 125 mg/dL — ABNORMAL HIGH (ref 70–99)

## 2021-05-27 MED ORDER — PANTOPRAZOLE SODIUM 40 MG PO TBEC
40.0000 mg | DELAYED_RELEASE_TABLET | Freq: Every day | ORAL | Status: DC
  Administered 2021-05-27 – 2021-06-01 (×6): 40 mg via ORAL
  Filled 2021-05-27 (×7): qty 1

## 2021-05-27 MED ORDER — LACTATED RINGERS IV BOLUS
500.0000 mL | Freq: Once | INTRAVENOUS | Status: AC
  Administered 2021-05-27: 500 mL via INTRAVENOUS

## 2021-05-27 NOTE — Progress Notes (Signed)
Physical Therapy Treatment Patient Details Name: Deborah Carey MRN: [id number] DOB: 1950-10-01 Today's Date: 05/27/2021   History of Present Illness 70 y.o. female presents to [facility] hospital on 05/25/2021 with R sided weakness, L eye deviation and aphasia. Pt received TKN and was intubated due to worsening mental status. CTA demonstrates L M1/MCA occlusion, pt taken to IR for mechanical thrombectomy with complete revascularization. PMH includes osteoarthritis, anxiety, and cataracts.    PT Comments    Patient progressing slowly.  Limited tx to avoid wearing her out since spouse reports for another swallow test today and they are hopeful she can pass.  Patient weak on R and with steps note that it buckles.  She remains excellent candidate for CIR.  PT to continue to follow.     Recommendations for follow up therapy are one component of a multi-disciplinary discharge planning process, led by the attending physician.  Recommendations may be updated based on patient status, additional functional criteria and insurance authorization.  Follow Up Recommendations  CIR     Equipment Recommendations  Other (comment) (TBA)    Recommendations for Other Services       Precautions / Restrictions Precautions Precautions: Fall     Mobility  Bed Mobility Overal bed mobility: Needs Assistance Bed Mobility: Supine to Sit;Sit to Supine     Supine to sit: Min assist;HOB elevated Sit to supine: Mod assist   General bed mobility comments: use of bed rails to upright and assist for legs to supine    Transfers Overall transfer level: Needs assistance Equipment used: 1 person hand held assist Transfers: Sit to/from Stand Sit to Stand: Min assist         General transfer comment: up to standing at bedside with HHA  Ambulation/Gait Ambulation/Gait assistance: Mod assist Gait Distance (Feet): 1 Feet Assistive device: 1 person hand held assist Gait Pattern/deviations: Trendelenburg      General Gait Details: assist to side step to [facility] w/HHA on R and noting R LE weakness/giving away with weight bearing   Stairs             Wheelchair Mobility    Modified Rankin (Stroke Patients Only) Modified Rankin (Stroke Patients Only) Pre-Morbid Rankin Score: No symptoms Modified Rankin: Moderately severe disability     Balance Overall balance assessment: Needs assistance Sitting-balance support: No upper extremity supported;Feet supported Sitting balance-Leahy Scale: Fair Sitting balance - Comments: sitting EOB without LOB donned second gown and fixing hair with A   Standing balance support: Single extremity supported Standing balance-Leahy Scale: Poor Standing balance comment: R UE HHA in standing               High Level Balance Comments: performed alternate step taps forward with R LE buckling at times stepping with and attempted marching in place with A due to R LE weakness            Cognition Arousal/Alertness: Awake/alert Behavior During Therapy: Flat affect Overall Cognitive Status: Impaired/Different from baseline Area of Impairment: Orientation;Attention;Memory;Following commands;Safety/judgement                 Orientation Level: Disoriented to;Time;Place (difficulty with expressive language) Current Attention Level: Sustained Memory: Decreased short-term memory Following Commands: Follows one step commands with increased time Safety/Judgement: Decreased awareness of safety;Decreased awareness of deficits Awareness: Emergent Problem Solving: Slow processing General Comments: will further assess      Exercises      General Comments General comments (skin integrity, edema, etc.): VSS on RA  Pertinent Vitals/Pain Pain Assessment: Faces Faces Pain Scale: Hurts a little bit Pain Location: R hip Pain Descriptors / Indicators: Guarding Pain Intervention(s): Monitored during session;Repositioned    Home Living  Family/patient expects to be discharged to:: Private residence Living Arrangements: Spouse/significant other Available Help at Discharge: Family;Available 24 hours/day Type of Home: House Home Access: Stairs to enter Entrance Stairs-Rails: None Home Layout: Two level;Able to live on main level with bedroom/bathroom Home Equipment: None      Prior Function Level of Independence: Independent      Comments: driving   PT Goals (current goals can now be found in the care plan section) Acute Rehab PT Goals Patient Stated Goal: per family for her to improve and go home after rehab Progress towards PT goals: Progressing toward goals    Frequency    Min 4X/week      PT Plan Current plan remains appropriate    Co-evaluation              AM-PAC PT "6 Clicks" Mobility   Outcome Measure  Help needed turning from your back to your side while in a flat bed without using bedrails?: A Little Help needed moving from lying on your back to sitting on the side of a flat bed without using bedrails?: A Little Help needed moving to and from a bed to a chair (including a wheelchair)?: A Little Help needed standing up from a chair using your arms (e.g., wheelchair or bedside chair)?: A Little Help needed to walk in hospital room?: A Lot Help needed climbing 3-5 steps with a railing? : Total 6 Click Score: 15    End of Session   Activity Tolerance: Patient tolerated treatment well Patient left: in bed;with call bell/phone within reach;with family/visitor present   PT Visit Diagnosis: Other abnormalities of gait and mobility (R26.89);Muscle weakness (generalized) (M62.81);Hemiplegia and hemiparesis Hemiplegia - Right/Left: Right Hemiplegia - caused by: Cerebral infarction     Time: 0945-1010 PT Time Calculation (min) (ACUTE ONLY): 25 min  Charges:  $Therapeutic Activity: 23-37 mins                     Deborah Carey, PT Acute Rehabilitation  Services HMCNO:[phone] [phone] 05/27/2021    Deborah Carey 05/27/2021, 2:36 PM

## 2021-05-27 NOTE — Evaluation (Signed)
Clinical/Bedside Swallow Evaluation Patient Details  Name: Deborah Carey MRN: [id number] Date of Birth: 11-21-1950  Today's Date: 05/27/2021 Time: SLP Start Time (ACUTE ONLY): 1120 SLP Stop Time (ACUTE ONLY): 1130 SLP Time Calculation (min) (ACUTE ONLY): 10 min  Past Medical History:  Past Medical History:  Diagnosis Date   Allergy    allergic rhinitis   Anxiety    Cataract    removed both eyes    Infertility, female    Osteoarthritis of both knees    OA    PONV (postoperative nausea and vomiting)    Post-menopausal bleeding    neg endo bx.   Past Surgical History:  Past Surgical History:  Procedure Laterality Date   cataract surgery  2011   CESAREAN SECTION     times 2   COLONOSCOPY  2010   hems    ENDOMETRIAL BIOPSY     neg for CA cells   FACIAL COSMETIC SURGERY  2006   face lift   FOOT SURGERY  1998   rt heel spur removal   IR CT HEAD LTD  05/25/2021   IR PERCUTANEOUS ART THROMBECTOMY/INFUSION INTRACRANIAL INC DIAG ANGIO  05/25/2021   IR US GUIDE VASC ACCESS RIGHT  05/25/2021   RADIOLOGY WITH ANESTHESIA N/A 05/25/2021   Procedure: IR WITH ANESTHESIA;  Surgeon: Aletta Edouard, MD;  Location: [facility];  Service: Radiology;  Laterality: N/A;   REFRACTIVE SURGERY  08/1999   HPI:  Deborah Carey with PMH of cataracts, anxiety, osteoarthritis who was at the cancer center with her husband when she started having right-sided weakness with left eye deviation and aphasia. CTA with left M1/MCA occlusion and she was taken emergently to IR for mechanical thrombectomy which resulted in complete recanalization. Intubated 9/20 for procedure and extubated the same day.    Assessment / Plan / Recommendation  Clinical Impression  Pt presents with concern for component of pharyngeal dyshagia. Isolated coughing and throat clearing noted with thin liquids via cup and straw. No overt s/sx of aspiration noted with puree or solid PO. Pt did exhibit some prolonge mastication with solids though no  pocketing exhibited orally. Will proceed with instrumental assessment given novel CVA to guide diet recommendations. Pt okay for PO meds in puree until completion of MBSS this date.  SLP Visit Diagnosis: Dysphagia, unspecified (R13.10)    Aspiration Risk  Moderate aspiration risk;Mild aspiration risk    Diet Recommendation   NPO pending results of MBSS  Medication Administration: Whole meds with puree    Other  Recommendations Oral Care Recommendations: Oral care QID    Recommendations for follow up therapy are one component of a multi-disciplinary discharge planning process, led by the attending physician.  Recommendations may be updated based on patient status, additional functional criteria and insurance authorization.  Follow up Recommendations Other (comment) (TBD)      Frequency and Duration min 2x/week  2 weeks       Prognosis Prognosis for Safe Diet Advancement: Good Barriers to Reach Goals: Language deficits      Swallow Study   General Date of Onset: 05/25/21 HPI: Deborah Carey with PMH of cataracts, anxiety, osteoarthritis who was at the cancer center with her husband when she started having right-sided weakness with left eye deviation and aphasia. CTA with left M1/MCA occlusion and she was taken emergently to IR for mechanical thrombectomy which resulted in complete recanalization. Intubated 9/20 for procedure and extubated the same day. Type of Study: Bedside Swallow Evaluation Previous Swallow Assessment:  none on file Diet Prior to this Study: NPO Temperature Spikes Noted: No Respiratory Status: Room air History of Recent Intubation: Yes Length of Intubations (days): 1 days Date extubated: 05/25/21 Behavior/Cognition: Alert;Cooperative Oral Cavity Assessment: Within Functional Limits Oral Care Completed by SLP: Yes Vision: Functional for self-feeding Self-Feeding Abilities: Needs set up Patient Positioning: Upright in bed Baseline Vocal Quality:  Normal Volitional Cough: Strong Volitional Swallow: Able to elicit    Oral/Motor/Sensory Function Overall Oral Motor/Sensory Function: Within functional limits   Ice Chips Ice chips: Within functional limits Presentation: Spoon   Thin Liquid Thin Liquid: Impaired Presentation: Cup;Straw Oral Phase Functional Implications: Prolonged oral transit Pharyngeal  Phase Impairments: Suspected delayed Swallow;Multiple swallows;Throat Clearing - Delayed;Throat Clearing - Immediate;Cough - Delayed    Nectar Thick Nectar Thick Liquid: Not tested   Honey Thick Honey Thick Liquid: Not tested   Puree Puree: Within functional limits Presentation: Spoon   Solid     Solid: Impaired Presentation: Self Fed Oral Phase Impairments: Impaired mastication Oral Phase Functional Implications: Prolonged oral transit Pharyngeal Phase Impairments: Suspected delayed Swallow;Multiple swallows      [name] MA, CCC-SLP Acute Rehabilitation Services   05/27/2021,12:27 PM

## 2021-05-27 NOTE — Progress Notes (Signed)
NAME:  Deborah Carey, MRN:  [id number], DOB:  22-Nov-1950, LOS: 2 ADMISSION DATE:  05/25/2021, CONSULTATION DATE:  05/27/21 REFERRING MD: Theda Sers , CHIEF COMPLAINT:  R sided weakness, stroke   History of Present Illness:  Deborah Carey is a 70 y.o. F with PMH of cataracts, anxiety, osteoarthritis who was at the cancer center with her husband when she started having right-sided weakness with left eye deviation and aphasia.  She was brought to the ED as a code stroke.  She is not on a blood thinner and no history of stroke risk factors.  Pt presented to [facility] ED and received TNK intubated for worsening mental status, transferred emergently to [facility].  She was taken emergently to CT and started on Cleviprex for hypertension.  CTA with left M1/MCA occlusion and she was taken emergently to IR for mechanical thrombectomy which resulted in complete recanalization.  She was intubated prior to the procedure   Significant Hospital Events: Including procedures, antibiotic start and stop dates in addition to other pertinent events   9/21 intubated, consulted 9/22 extubated  Interim History / Subjective:  Patient was successfully extubated yesterday In the evening she became agitated and restless, there was question of abdominal pain, bladder scan was done which showed urinary retention, straight cath was done with 300 cc of urine out CT abdomen and pelvis was done which showed no acute abnormalities  Objective   Blood pressure (!) 135/58, pulse 71, temperature 97.9 F (36.6 C), temperature source Axillary, resp. rate (!) 26, height 5\' 4"  (1.626 m), weight 96 kg, last menstrual period 09/05/2008, SpO2 100 %.        Intake/Output Summary (Last 24 hours) at 05/27/2021 0926 Last data filed at 05/27/2021 0900 Gross per 24 hour  Intake 1452.05 ml  Output 925 ml  Net 527.05 ml   Filed Weights   05/25/21 1849  Weight: 96 kg      Physical exam: General: Acutely ill-appearing elderly Caucasian female,  lying on the bed HEENT: [place]/AT, eyes anicteric.  Dry mucous membranes Neuro: Awake, following commands, 3/5 on right upper and right lower extremity 5/5 on left side.  Slurred speech, right facial droop Chest: Coarse breath sounds, no wheezes or rhonchi Heart: Regular rate and rhythm, no murmurs or gallops Abdomen: Soft, nontender, nondistended, bowel sounds present Skin: No rash  Resolved Hospital Problem list   Acute respiratory insufficiency due to acute CVA  Assessment & Plan:   Acute encephalopathy in the setting of stroke Patient was agitated overnight Now she is sleeping calm and controlled Avoid sedation Encourage regular sleep cycle   Acute left MCA stroke s/p TNK and mechanical thrombectomy with complete revascularization with TICI3 Likely secondary to paroxysmal A. fib Continue neuro watch every hour Continue secondary stroke prophylaxis Stroke team is following MRI brain showed stable left basal ganglia stroke with microhemorrhages LDL is 109, needs high-dose statin, A1c is 5.8 PT/ OT/ SLP today   Hypertensive emergency  Patient blood pressure is well controlled Currently not on antihypertensives   Paroxysmal atrial fibrillation New diagnosis of atrial fibrillation, patient has converted to sinus rhythm Her CHA2DS2-VASc score is 5 Defer to neurology when to start anticoagulation for secondary stroke prophylaxis  Best Practice (right click and "Reselect all SmartList Selections" daily)   Diet/type: NPO pending swallow evaluation DVT prophylaxis: SCD due to microhemorrhages GI prophylaxis: PPI Lines: N/A Foley: N/A Code Status:  full code Last date of multidisciplinary goals of care discussion [per primary]  Labs  CBC: Recent Labs  Lab 05/25/21 1447 05/25/21 2204 05/26/21 0429 05/27/21 0318  WBC 10.0  --  11.0* 8.1  NEUTROABS 5.9  --   --   --   HGB 14.8 12.9 13.1 11.6*  HCT 40.3 38.0 38.6 34.5*  MCV 92.2  --  93.5 94.8  PLT 267  --  206 177     Basic Metabolic Panel: Recent Labs  Lab 05/25/21 1520 05/25/21 1907 05/25/21 2204 05/26/21 0429 05/27/21 0318  NA 138  --  138 137 139  K 4.0  --  4.1 3.9 4.0  CL 102  --   --  103 105  CO2 26  --   --  23 27  GLUCOSE 128*  --   --  146* 90  BUN 18  --   --  14 12  CREATININE 0.83  --   --  0.78 0.77  CALCIUM 9.4  --   --  8.9 8.7*  MG  --  2.1  --   --   --   PHOS  --  3.6  --   --   --    GFR: Estimated Creatinine Clearance: 71.4 mL/min (by C-G formula based on SCr of 0.77 mg/dL). Recent Labs  Lab 05/25/21 1447 05/26/21 0429 05/27/21 0318  WBC 10.0 11.0* 8.1    Liver Function Tests: Recent Labs  Lab 05/25/21 1520 05/26/21 0429  AST 20 18  ALT 16 15  ALKPHOS 68 47  BILITOT 0.8 0.6  PROT 7.3 6.1*  ALBUMIN 4.2 3.4*   No results for input(s): LIPASE, AMYLASE in the last 168 hours. No results for input(s): AMMONIA in the last 168 hours.  ABG    Component Value Date/Time   PHART 7.386 05/25/2021 2204   PCO2ART 52.1 (H) 05/25/2021 2204   PO2ART 483 (H) 05/25/2021 2204   HCO3 31.5 (H) 05/25/2021 2204   TCO2 33 (H) 05/25/2021 2204   O2SAT 100.0 05/25/2021 2204     Coagulation Profile: Recent Labs  Lab 05/25/21 1447  INR 0.9    Cardiac Enzymes: No results for input(s): CKTOTAL, CKMB, CKMBINDEX, TROPONINI in the last 168 hours.  HbA1C: Hgb A1c MFr Bld  Date/Time Value Ref Range Status  05/26/2021 04:29 AM 5.8 (H) 4.8 - 5.6 % Final    Comment:    (NOTE) Pre diabetes:          5.7%-6.4%  Diabetes:              >6.4%  Glycemic control for   <7.0% adults with diabetes   05/25/2020 10:15 AM 6.4 4.6 - 6.5 % Final    Comment:    Glycemic Control Guidelines for People with Diabetes:Non Diabetic:  <6%Goal of Therapy: <7%Additional Action Suggested:  >8%     CBG: Recent Labs  Lab 05/25/21 1956 05/25/21 2356 05/26/21 0403 05/26/21 0820  GLUCAP 142* 112* 135* 155*   Total critical care time: 32 minutes  Performed by: Brady care time was exclusive of separately billable procedures and treating other patients.   Critical care was necessary to treat or prevent imminent or life-threatening deterioration.   Critical care was time spent personally by me on the following activities: development of treatment plan with patient and/or surrogate as well as nursing, discussions with consultants, evaluation of patient's response to treatment, examination of patient, obtaining history from patient or surrogate, ordering and performing treatments and interventions, ordering and review of laboratory studies, ordering and review of  radiographic studies, pulse oximetry and re-evaluation of patient's condition.   Jacky Kindle MD [facility] See Amion for pager If no response to pager, please call [phone] until 7pm After 7pm, Please call E-link [phone]

## 2021-05-27 NOTE — Progress Notes (Signed)
Modified Barium Swallow Progress Note  Patient Details  Name: Deborah Carey MRN: [id number] Date of Birth: 29-Apr-1951  Today's Date: 05/27/2021  Modified Barium Swallow completed.  Full report located under Chart Review in the Imaging Section.  Brief recommendations include the following:  Clinical Impression  Pt presents with a mild oral and mild to moderate pharyngeal dysphagia s/p CVA. Oral deficits c/b delayed oral transit, piecemeal swallow with mechanical soft textures, and reduced barium tablet propulsion to pharynx during barium tablet and nectar thick liquids series (pt able to propel tabelt to pharynx after 3 attempts). Pharyngeal deficits marked by reduced timely swallow initiation with thin liquids/ decreased timely epiglottic inversion allowing for pre swallow spillage with laryngeal penetration and tracheal aspiration of thins via both cup and straw (PAS-7,8). Pt would trigger a delayed throat clear in some instances but overall sensation of aspiration events remained poor and aspirates did not clear trachea with weaker throat clear or subtle cough. Pt with isolated instance of laryngeal penetration of nectar thick liquids when with barium tablet series only (PAS-3); nectar thick liquids in isolation yielded intact airway protection as well as remaining POs tested (PAS-1). Recommend dysphagia 3 (mechanical soft) and nectar thick liquids with meds in puree and full supervision with POs. SLP to follow up.   Swallow Evaluation Recommendations       SLP Diet Recommendations: Dysphagia 3 (Mech soft) solids;Nectar thick liquid   Liquid Administration via: Cup;Straw   Medication Administration: Whole meds with puree   Supervision: Patient able to self feed;Full supervision/cueing for compensatory strategies   Compensations: Small sips/bites;Slow rate;Minimize environmental distractions   Postural Changes: Seated upright at 90 degrees;Remain semi-upright after after feeds/meals  (Comment)   Oral Care Recommendations: Oral care BID   Other Recommendations: Order thickener from [place], [place]   05/27/2021,2:16 PM

## 2021-05-27 NOTE — Evaluation (Addendum)
Occupational Therapy Evaluation Patient Details Name: Deborah Carey MRN: [id number] DOB: Mar 16, 1951 Today's Date: 05/27/2021   History of Present Illness 70 y.o. female presents to [facility] hospital on 05/25/2021 with R sided weakness, L eye deviation and aphasia. Pt received TKN and was intubated due to worsening mental status. CTA demonstrates L M1/MCA occlusion, pt taken to IR for mechanical thrombectomy with complete revascularization. PMH includes osteoarthritis, anxiety, and cataracts.   Clinical Impression   PTA pt lives independently with her husband. Daughter states her Mom is very active and "takes care of everyone". Pt presents with significant functional decline, requiring Mod A with UB ADL and Max A with LB ADL and mod A with limited mobility to recliner due to deficits listed below. Will benefit from intense therapy at CIR to maximize functional level of independence and facilitate safe DC home with supportive family.  VSS throughout session. Will follow acutely.      Recommendations for follow up therapy are one component of a multi-disciplinary discharge planning process, led by the attending physician.  Recommendations may be updated based on patient status, additional functional criteria and insurance authorization.   Follow Up Recommendations  CIR    Equipment Recommendations  3 in 1 bedside commode    Recommendations for Other Services Rehab consult     Precautions / Restrictions Precautions Precautions: Fall      Mobility Bed Mobility Overal bed mobility: Needs Assistance Bed Mobility: Supine to Sit;Sit to Supine     Supine to sit: Min assist;HOB elevated     General bed mobility comments: use of bed rails    Transfers Overall transfer level: Needs assistance Equipment used: 1 person hand held assist Transfers: Sit to/from [employer] Transfers - Mod A Sit to Stand: Min assist              Balance Overall balance assessment: Needs  assistance Sitting-balance support: No upper extremity supported;Feet supported Sitting balance-Leahy Scale: Fair     Standing balance support: Single extremity supported Standing balance-Leahy Scale: Poor Standing balance comment: pt maintains LUE support of bed rail in standing                           ADL either performed or assessed with clinical judgement   ADL Overall ADL's : Needs assistance/impaired     Grooming: Moderate assistance   Upper Body Bathing: Minimal assistance;Sitting   Lower Body Bathing: Moderate assistance;Sit to/from stand   Upper Body Dressing : Moderate assistance;Sitting   Lower Body Dressing: Maximal assistance;Sit to/from stand   Toilet Transfer: Stand-pivot;Mod assistance   Toileting- Clothing Manipulation and Hygiene: Maximal assistance Toileting - Clothing Manipulation Details (indicate cue type and reason): has not voided since 4am per nursing - unsure of awareness of needing to void     Functional mobility during ADLs: Modassistance       Vision Baseline Vision/History: 1 Wears glasses Vision Assessment?: Yes Eye Alignment: Within Functional Limits Ocular Range of Motion: Within Functional Limits Tracking/Visual Pursuits: Able to track stimulus in all quads without difficulty Saccades: Decreased speed of saccadic movement Visual Fields: Other (comment) (will further assess) Additional Comments: reports she wears reading glasses; not in room; will further assess; appears to undershoot, but most likely due to RUE weakness     Perception     Praxis Praxis Praxis tested?: Deficits Deficits: Perseveration;Motor Impersistence    Pertinent Vitals/Pain Pain Assessment: No/denies pain     Hand Dominance Right  Extremity/Trunk Assessment Upper Extremity Assessment Upper Extremity Assessment: RUE deficits/detail RUE Deficits / Details: AROM WFL however decreased in-hand manipulation skills making tasks difficult; able to  grasp objects however noted frequent drops- appears unaware of dropping items; R Grip weaker than L; attempts to use RUE Sensation: decreased light touch RUE Coordination: decreased fine motor;decreased gross motor   Lower Extremity Assessment Lower Extremity Assessment: Defer to PT evaluation   Cervical / Trunk Assessment Cervical / Trunk Assessment: Normal (R bias at times)   Communication Communication Communication: Expressive difficulties   Cognition Arousal/Alertness: Awake/alert Behavior During Therapy: Flat affect Overall Cognitive Status: Impaired/Different from baseline Area of Impairment: Orientation;Attention;Memory;Awareness;Problem solving;Following commands;Safety/judgement                 Orientation Level: Disoriented to;Time;Place (difficulty with expressive language) Current Attention Level: Sustained Memory: Decreased short-term memory Following Commands: Follows one step commands with increased time Safety/Judgement: Decreased awareness of safety;Decreased awareness of deficits Awareness: Emergent Problem Solving: Slow processing General Comments: will further assess   General Comments  VSS on RA    Exercises     Shoulder Instructions      Home Living Family/patient expects to be discharged to:: Private residence Living Arrangements: Spouse/significant other Available Help at Discharge: Family;Available 24 hours/day Type of Home: House Home Access: Stairs to enter [employer] of Steps: 4 Entrance Stairs-Rails: None Home Layout: Two level;Able to live on main level with bedroom/bathroom     Bathroom Shower/Tub: Occupational psychologist: Standard     Home Equipment: None          Prior Functioning/Environment Level of Independence: Independent        Comments: driving        OT Problem List: Decreased strength;Decreased range of motion;Decreased activity tolerance;Impaired vision/perception;Impaired balance  (sitting and/or standing);Decreased coordination;Decreased cognition;Decreased safety awareness;Impaired sensation;Obesity;Impaired UE functional use      OT Treatment/Interventions: Self-care/ADL training;Therapeutic exercise;Neuromuscular education;Energy conservation;DME and/or AE instruction;Therapeutic activities;Cognitive remediation/compensation;Visual/perceptual remediation/compensation;Patient/family education;Balance training    OT Goals(Current goals can be found in the care plan section) Acute Rehab OT Goals Patient Stated Goal: per family for her to improve and go home after rehab OT Goal Formulation: With patient/family Time For Goal Achievement: 06/10/21 Potential to Achieve Goals: Good  OT Frequency: Min 2X/week   Barriers to D/C:            Co-evaluation              AM-PAC OT "6 Clicks" Daily Activity     Outcome Measure Help from another person eating meals?: Total Help from another person taking care of personal grooming?: A Lot Help from another person toileting, which includes using toliet, bedpan, or urinal?: A Lot Help from another person bathing (including washing, rinsing, drying)?: A Lot Help from another person to put on and taking off regular upper body clothing?: A Lot Help from another person to put on and taking off regular lower body clothing?: A Lot 6 Click Score: 11   End of Session Equipment Utilized During Treatment: Gait belt Nurse Communication: Mobility status  Activity Tolerance: Patient tolerated treatment well Patient left: in chair;with call bell/phone within reach;with chair alarm set  OT Visit Diagnosis: Unsteadiness on feet (R26.81);Other abnormalities of gait and mobility (R26.89);Muscle weakness (generalized) (M62.81);Other symptoms and signs involving cognitive function;Hemiplegia and hemiparesis Hemiplegia - Right/Left: Right Hemiplegia - dominant/non-dominant: Dominant Hemiplegia - caused by: Cerebral infarction  Time: 1201-1227 OT Time Calculation (min): 26 min Charges:  OT General Charges $OT Visit: 1 Visit OT Evaluation $OT Eval Moderate Complexity: 1 Mod OT Treatments $Self Care/Home Management : 8-22 mins  hyperlipidemia   Deborah Carey,Deborah Carey 05/27/2021, 12:46 PM

## 2021-05-27 NOTE — Progress Notes (Signed)
Transitions of Care Team following this patient for discharge planning.  Currently, patient not medically stable; will continue to follow as patient progresses.    Renzo Vincelette LCSW

## 2021-05-27 NOTE — Progress Notes (Signed)
Occupational Therapy Treatment Note]    05/27/21 1434  OT Visit Information  Last OT Received On 05/27/21  Assistance Needed +1  History of Present Illness 70 y.o. female presents to [facility] hospital on 05/25/2021 with R sided weakness, L eye deviation and aphasia. Pt received TKN and was intubated due to worsening mental status. CTA demonstrates L M1/MCA occlusion, pt taken to IR for mechanical thrombectomy with complete revascularization. PMH includes osteoarthritis, anxiety, and cataracts.  Precautions  Precautions Fall  Pain Assessment  Pain Assessment Faces  Faces Pain Scale 2  Pain Location R hip  Pain Descriptors / Indicators Guarding  Pain Intervention(s) Monitored during session  Cognition  Arousal/Alertness Awake/alert  Behavior During Therapy Flat affect  Overall Cognitive Status Impaired/Different from baseline  Area of Impairment Orientation;Attention;Memory;Following commands;Safety/judgement  Current Attention Level Sustained  Memory Decreased short-term memory  Following Commands Follows one step commands with increased time  Safety/Judgement Decreased awareness of safety;Decreased awareness of deficits  Problem Solving Slow processing  General Comments will further assess  ADL  General ADL Comments Assisted pt back to bed. Pt more fatigued adn required increased assitance to trasnfer back to bed. Appears to demosntrate dificulty with motor planning at times.  Bed Mobility  Overal bed mobility Needs Assistance  Bed Mobility Sit to Supine  Sit to supine Max assist  Balance  Sitting balance-Leahy Scale Fair  Standing balance-Leahy Scale Poor  Transfers  Overall transfer level Needs assistance  Equipment used 2 person hand held assist  Sit to Stand Mod assist;+2 safety/equipment  OT - End of Session  Activity Tolerance Patient tolerated treatment well  Patient left in bed;with call bell/phone within reach;with nursing/sitter in room  Nurse Communication Mobility  status  OT Assessment/Plan  OT Plan Discharge plan remains appropriate  OT Visit Diagnosis Unsteadiness on feet (R26.81);Other abnormalities of gait and mobility (R26.89);Muscle weakness (generalized) (M62.81);Other symptoms and signs involving cognitive function;Hemiplegia and hemiparesis  Hemiplegia - Right/Left Right  Hemiplegia - dominant/non-dominant Dominant  Hemiplegia - caused by Cerebral infarction  OT Frequency (ACUTE ONLY) Min 2X/week  Recommendations for Other Services Rehab consult  Follow Up Recommendations CIR  OT Equipment 3 in 1 bedside commode  AM-PAC OT "6 Clicks" Daily Activity Outcome Measure (Version 2)  Help from another person eating meals? 1  Help from another person taking care of personal grooming? 2  Help from another person toileting, which includes using toliet, bedpan, or urinal? 2  Help from another person bathing (including washing, rinsing, drying)? 2  Help from another person to put on and taking off regular upper body clothing? 2  Help from another person to put on and taking off regular lower body clothing? 2  6 Click Score 11  Progressive Mobility  What is the highest level of mobility based on the progressive mobility assessment? Level 3 (Stands with assist) - Balance while standing  and cannot march in place  Mobility Out of bed for toileting;Out of bed to chair with meals  OT Goal Progression  Progress towards OT goals Progressing toward goals  Acute Rehab OT Goals  Patient Stated Goal per family for her to improve and go home after rehab  OT Goal Formulation With patient/family  Time For Goal Achievement 06/10/21  Potential to Achieve Goals Good  OT Time Calculation  OT Start Time (ACUTE ONLY) 1246  OT Stop Time (ACUTE ONLY) 1256  OT Time Calculation (min) 10 min  OT General Charges  $OT Visit 1 Visit  OT Treatments  $Self  Care/Home Management  8-22 mins  Maurie Boettcher, OT/L   Acute OT Clinical Specialist Acute Rehabilitation  Services Pager [phone] Office [phone]

## 2021-05-27 NOTE — Progress Notes (Signed)
Progress Note  Patient Name: Deborah Carey Date of Encounter: 05/27/2021  [facility] Cardiologist: None   Subjective   No complaints.  Inpatient Medications    Scheduled Meds:   stroke: mapping our early stages of recovery book   Does not apply Once   aspirin  300 mg Rectal Daily   Or   aspirin EC  325 mg Oral Daily   atorvastatin  40 mg Oral Daily   chlorhexidine  15 mL Mouth/Throat BID   Chlorhexidine Gluconate Cloth  6 each Topical Daily   insulin aspart  0-15 Units Subcutaneous Q4H   mouth rinse  15 mL Mouth Rinse BID   pantoprazole (PROTONIX) IV  40 mg Intravenous Q24H   Continuous Infusions:  sodium chloride     PRN Meds: acetaminophen **OR** acetaminophen (TYLENOL) oral liquid 160 mg/5 mL **OR** acetaminophen, ondansetron (ZOFRAN) IV, senna-docusate   Vital Signs    Vitals:   05/27/21 0734 05/27/21 0800 05/27/21 0900 05/27/21 1000  BP:  (!) 100/53 (!) 135/58 140/86  Pulse:  65 71 96  Resp:  17 (!) 26 (!) 22  Temp: 97.9 F (36.6 C)     TempSrc: Axillary     SpO2:  100% 100% 93%  Weight:      Height:        Intake/Output Summary (Last 24 hours) at 05/27/2021 1127 Last data filed at 05/27/2021 1000 Gross per 24 hour  Intake 1657.91 ml  Output 925 ml  Net 732.91 ml   Last 3 Weights 05/25/2021 05/25/2021 05/29/2020  Weight (lbs) 211 lb 10.3 oz 214 lb 214 lb 6 oz  Weight (kg) 96 kg 97.07 kg 97.24 kg      Telemetry    Atrial fibrillation, rate controlled- Personally Reviewed  ECG      Physical Exam   GEN: No acute distress.  Difficulty speaking Neck: No JVD Cardiac: Irregularly irregular, no murmurs, rubs, or gallops.  Respiratory: Clear to auscultation bilaterally. GI: Soft, nontender, non-distended  MS: No edema; No deformity. Neuro:  Nonfocal  Psych: Normal affect   Labs    High Sensitivity Troponin:   Recent Labs  Lab 05/25/21 1447  TROPONINIHS 7     Chemistry Recent Labs  Lab 05/25/21 1520 05/25/21 1907 05/25/21 2204  05/26/21 0429 05/27/21 0318  NA 138  --  138 137 139  K 4.0  --  4.1 3.9 4.0  CL 102  --   --  103 105  CO2 26  --   --  23 27  GLUCOSE 128*  --   --  146* 90  BUN 18  --   --  14 12  CREATININE 0.83  --   --  0.78 0.77  CALCIUM 9.4  --   --  8.9 8.7*  MG  --  2.1  --   --   --   PROT 7.3  --   --  6.1*  --   ALBUMIN 4.2  --   --  3.4*  --   AST 20  --   --  18  --   ALT 16  --   --  15  --   ALKPHOS 68  --   --  47  --   BILITOT 0.8  --   --  0.6  --   GFRNONAA >60  --   --  >60 >60  ANIONGAP 10  --   --  11 7    Lipids  Recent  Labs  Lab 05/26/21 0429  CHOL 171  TRIG 64  66  HDL 49  LDLCALC 109*  CHOLHDL 3.5    Hematology Recent Labs  Lab 05/25/21 1447 05/25/21 2204 05/26/21 0429 05/27/21 0318  WBC 10.0  --  11.0* 8.1  RBC 4.37  --  4.13 3.64*  HGB 14.8 12.9 13.1 11.6*  HCT 40.3 38.0 38.6 34.5*  MCV 92.2  --  93.5 94.8  MCH 33.9  --  31.7 31.9  MCHC 36.7*  --  33.9 33.6  RDW 12.3  --  12.4 12.3  PLT 267  --  206 177   Thyroid  Recent Labs  Lab 05/26/21 0429  TSH 4.101    BNPNo results for input(s): BNP, PROBNP in the last 168 hours.  DDimer No results for input(s): DDIMER in the last 168 hours.   Radiology    CT ABDOMEN PELVIS W WO CONTRAST  Result Date: 05/26/2021 CLINICAL DATA:  Abdominal pain. EXAM: CT ABDOMEN AND PELVIS WITHOUT AND WITH CONTRAST TECHNIQUE: Multidetector CT imaging of the abdomen and pelvis was performed following the standard protocol before and following the bolus administration of intravenous contrast. CONTRAST:  5m OMNIPAQUE IOHEXOL 350 MG/ML SOLN COMPARISON:  None. FINDINGS: Lower chest: The lung bases are clear of an acute process. No pleural effusions or pulmonary lesions. The heart is within normal limits in size. No pericardial effusion. Hepatobiliary: No hepatic lesions or intrahepatic biliary dilatation. High attenuation material in the gallbladder likely due to vicarious excretion of contrast material from prior CT scan  and interventional procedure. There is a 2.4 cm gallstone noted the gallbladder but no findings suspicious for acute cholecystitis. No common bile duct dilatation. Pancreas: No mass, inflammation or ductal dilatation. Spleen: Normal size.  No focal lesions. Adrenals/Urinary Tract: Adrenal glands and kidneys are unremarkable. There is some contrast in both collecting systems. The bladder also contains contrast material. No bladder mass or asymmetric bladder wall thickening. Stomach/Bowel: The stomach, duodenum, small bowel and colon are unremarkable. No acute inflammatory changes, mass lesions or obstructive findings. The terminal ileum is normal. The appendix is normal. Scattered colonic diverticulosis but no findings for acute diverticulitis. Vascular/Lymphatic: Scattered atherosclerotic calcifications but no aneurysm or dissection. The major venous structures are patent. No mesenteric or retroperitoneal mass or adenopathy. Reproductive: The uterus and ovaries are unremarkable. Other: No pelvic mass or adenopathy. No free pelvic fluid collections. No inguinal mass or adenopathy. Small periumbilical abdominal wall hernia containing fat. Musculoskeletal: No significant bony findings. IMPRESSION: 1. No acute abdominal/pelvic findings, mass lesions or adenopathy. 2. Cholelithiasis without findings suspicious for acute cholecystitis. 3. Scattered colonic diverticulosis but no findings for acute diverticulitis. Electronically Signed   By: PMarijo SanesM.D.   On: 05/26/2021 20:30   MR ANGIO HEAD WO CONTRAST  Result Date: 05/26/2021 CLINICAL DATA:  Follow-up stroke. Left-sided weakness. Left MCA occlusion with intervention. EXAM: MRA HEAD WITHOUT CONTRAST TECHNIQUE: Angiographic images of the Circle of Willis were acquired using MRA technique without intravenous contrast. COMPARISON:  MRI earlier same day FINDINGS: Anterior circulation: Both internal carotid arteries are widely patent through the skull base and siphon  regions. The anterior and middle cerebral vessels are patent. Reconstituted flow in the left MCA without proximal stenosis or missing distal branch vessels. Posterior circulation: Both vertebral arteries widely patent to the basilar. No basilar stenosis. Stenoses in the PCA P2 segments, worse on the left than the right. Anatomic variants: None significant. Other: None. IMPRESSION: Good appearance of the anterior circulation flow  presently. Status post mechanical thrombectomy on the left. Wide patency of the left MCA branches presently. 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 Electronically Signed   By: Nelson Chimes M.D.   On: 05/26/2021 15:18   MR BRAIN WO CONTRAST  Result Date: 05/26/2021 CLINICAL DATA:  Follow-up stroke. EXAM: MRI HEAD WITHOUT CONTRAST TECHNIQUE: Multiplanar, multiecho pulse sequences of the brain and surrounding structures were obtained without intravenous contrast. COMPARISON:  MR angiography same day. CT studies and intervention done yesterday. FINDINGS: Brain: There is acute infarction affecting the corpus stratum on the left. Minimal punctate infarction in the left posterior frontal subcortical white matter. Swelling of the putamen and caudate, with minimal petechial blood products but without frank hemorrhage. Three punctate foci of acute infarction in the right medial frontal lobe, possibly small embolic infarctions in the right anterior cerebral artery territory. No midline shift. Elsewhere, there is old cortical and subcortical infarction in the right parietal lobe and moderate chronic small-vessel ischemic changes of the cerebral hemispheric white matter. No hydrocephalus. No extra-axial collection. Vascular: Major vessels at the base of the brain show flow. Skull and upper cervical spine: Negative Sinuses/Orbits: Clear/normal Other: None IMPRESSION: Acute infarction of the left putamen and caudate. Minimal petechial blood products but no frank hematoma. Mild  swelling but no midline shift. Three punctate acute infarctions within the medial right frontal lobe, probably micro embolic infarctions in the anterior cerebral artery territory. Old infarction in the right parietal cortical and subcortical brain and seen affecting the cerebral hemispheric deep white matter extensively. Electronically Signed   By: Nelson Chimes M.D.   On: 05/26/2021 15:51   IR CT Head Ltd  Result Date: 05/25/2021 INDICATION: Deborah Carey is an 70 year-old female with a past medical history significant for osteoarthritis and cataracts who presented emergently to the ED at [facility] due to right side weakness, with eyes drifting to left, and unable to talk with decreased level of consciousness. NIHSS at presentation was 19; baseline modified Rankin scale 0. Head CT showed no evidence of a large acute infarct or hemorrhage (ASPECTS 10). CTA showed a proximal left M1/MCA occlusion with good collaterals. She receive IV TNK and was then transferred to our service for a diagnostic cerebral angiogram and mechanical thrombectomy. EXAM: ULTRASOUND GUIDED VASCULAR ACCESS DIAGNOSTIC CEREBRAL ANGIOGRAM MECHANICAL THROMBECTOMY FLAT PANEL HEAD CT COMPARISON:  CT/CTA 05/25/2021. MEDICATIONS: No antibiotic administered. ANESTHESIA/SEDATION: The procedure was performed under general anesthesia. CONTRAST:  25 mL of Omnipaque 240 mg/mL FLUOROSCOPY TIME:  Fluoroscopy Time: 4 minutes 36 seconds (420 mGy). COMPLICATIONS: None immediate. TECHNIQUE: Informed written consent was obtained from the patient's husband after a thorough discussion of the procedural risks, benefits and alternatives. All questions were addressed. Maximal Sterile Barrier Technique was utilized including caps, mask, sterile gowns, sterile gloves, sterile drape, hand hygiene and skin antiseptic. A timeout was performed prior to the initiation of the procedure. The right groin was prepped and draped in the usual sterile fashion. Using a micropuncture  kit and the modified Seldinger technique, access was gained to the right common femoral artery and an 8 French sheath was placed. Real-time ultrasound guidance was utilized for vascular access including the acquisition of a permanent ultrasound image documenting patency of the accessed vessel. Under fluoroscopy, a Zoom 88 guide catheter was navigated over a 6 [place] Berenstein 2 catheter and a 0.035" Terumo Glidewire into the aortic arch. The catheter was placed into the left common carotid artery and then advanced into the  left internal carotid artery. The Berenstein 2 catheter was removed. Frontal and lateral angiograms of the head were obtained. FINDINGS: 1. Normal caliber of the right common femoral artery. 2. Occlusion of the left MCA at the proximal M1 segment. PROCEDURE: Under biplane roadmap, a zoom 71 aspiration catheter was navigated over an Aristotle 24 microguidewire into the cavernous segment of the left ICA. The aspiration catheter was then advanced to the level of occlusion and connected to an aspiration pump. Continuous aspiration was performed for 2 minutes. The guide catheter was connected to a VacLok syringe and advanced into the M1 segment while the aspiration catheter wasremoved under constant aspiration. The Zoom 88 guide catheter was continuously aspirated until clear blood return was obtained. Left ICA angiograms with frontal and lateral views showed complete left MCA vascular tree recanalization without evidence of thromboembolic complication. Flat panel CT of the head was obtained and post processed in a separate workstation with concurrent attending physician supervision. Selected images were sent to PACS. No evidence of hemorrhagic complication. A right common femoral artery angiogram was then obtained in right anterior oblique view via sheath side port. The puncture is at the level of the common femoral artery which has normal caliber and contour, adequate for closure device. The femoral  sheath was exchanged over the wire for a Perclose ProStyle which was utilized for access closure. Immediate hemostasis was achieved. IMPRESSION: Successful and uncomplicated mechanical thrombectomy performed with direct contact aspiration for treatment of a proximal left M1/MCA occlusion with complete recanalization (TICI3). PLAN: 1. Transfer to ICA for continued care. 2. SBP 120-140 mmHg for 24 hours post procedure. 3. Bed rest for 6 hours. Electronically Signed   By: Pedro Earls M.D.   On: 05/25/2021 17:58   IR US Guide Vasc Access Right  Result Date: 05/25/2021 INDICATION: Deborah Carey is an 70 year-old female with a past medical history significant for osteoarthritis and cataracts who presented emergently to the ED at [facility] due to right side weakness, with eyes drifting to left, and unable to talk with decreased level of consciousness. NIHSS at presentation was 19; baseline modified Rankin scale 0. Head CT showed no evidence of a large acute infarct or hemorrhage (ASPECTS 10). CTA showed a proximal left M1/MCA occlusion with good collaterals. She receive IV TNK and was then transferred to our service for a diagnostic cerebral angiogram and mechanical thrombectomy. EXAM: ULTRASOUND GUIDED VASCULAR ACCESS DIAGNOSTIC CEREBRAL ANGIOGRAM MECHANICAL THROMBECTOMY FLAT PANEL HEAD CT COMPARISON:  CT/CTA 05/25/2021. MEDICATIONS: No antibiotic administered. ANESTHESIA/SEDATION: The procedure was performed under general anesthesia. CONTRAST:  25 mL of Omnipaque 240 mg/mL FLUOROSCOPY TIME:  Fluoroscopy Time: 4 minutes 36 seconds (420 mGy). COMPLICATIONS: None immediate. TECHNIQUE: Informed written consent was obtained from the patient's husband after a thorough discussion of the procedural risks, benefits and alternatives. All questions were addressed. Maximal Sterile Barrier Technique was utilized including caps, mask, sterile gowns, sterile gloves, sterile drape, hand hygiene and skin antiseptic. A  timeout was performed prior to the initiation of the procedure. The right groin was prepped and draped in the usual sterile fashion. Using a micropuncture kit and the modified Seldinger technique, access was gained to the right common femoral artery and an 8 French sheath was placed. Real-time ultrasound guidance was utilized for vascular access including the acquisition of a permanent ultrasound image documenting patency of the accessed vessel. Under fluoroscopy, a Zoom 88 guide catheter was navigated over a 6 [place] Berenstein 2 catheter and a 0.035" Terumo [employer]  into the aortic arch. The catheter was placed into the left common carotid artery and then advanced into the left internal carotid artery. The Berenstein 2 catheter was removed. Frontal and lateral angiograms of the head were obtained. FINDINGS: 1. Normal caliber of the right common femoral artery. 2. Occlusion of the left MCA at the proximal M1 segment. PROCEDURE: Under biplane roadmap, a zoom 71 aspiration catheter was navigated over an Aristotle 24 microguidewire into the cavernous segment of the left ICA. The aspiration catheter was then advanced to the level of occlusion and connected to an aspiration pump. Continuous aspiration was performed for 2 minutes. The guide catheter was connected to a VacLok syringe and advanced into the M1 segment while the aspiration catheter wasremoved under constant aspiration. The Zoom 88 guide catheter was continuously aspirated until clear blood return was obtained. Left ICA angiograms with frontal and lateral views showed complete left MCA vascular tree recanalization without evidence of thromboembolic complication. Flat panel CT of the head was obtained and post processed in a separate workstation with concurrent attending physician supervision. Selected images were sent to PACS. No evidence of hemorrhagic complication. A right common femoral artery angiogram was then obtained in right anterior oblique view via  sheath side port. The puncture is at the level of the common femoral artery which has normal caliber and contour, adequate for closure device. The femoral sheath was exchanged over the wire for a Perclose ProStyle which was utilized for access closure. Immediate hemostasis was achieved. IMPRESSION: Successful and uncomplicated mechanical thrombectomy performed with direct contact aspiration for treatment of a proximal left M1/MCA occlusion with complete recanalization (TICI3). PLAN: 1. Transfer to ICA for continued care. 2. SBP 120-140 mmHg for 24 hours post procedure. 3. Bed rest for 6 hours. Electronically Signed   By: Pedro Earls M.D.   On: 05/25/2021 17:58   DG CHEST PORT 1 VIEW  Result Date: 05/26/2021 CLINICAL DATA:  Status post intubation. EXAM: PORTABLE CHEST 1 VIEW COMPARISON:  05/25/2021 FINDINGS: The ETT tip is 1.2 cm above the carina. Normal heart size. Interval resolution of previous interstitial and airspace opacities. Remote appearing right posterior rib fractures. IMPRESSION: 1. Stable position of ET tube with tip above the carina. 2. Interval clearing of interstitial edema. Electronically Signed   By: Kerby Moors M.D.   On: 05/26/2021 05:59   DG Chest Portable 1 View  Result Date: 05/25/2021 CLINICAL DATA:  Shortness of breath EXAM: PORTABLE CHEST 1 VIEW COMPARISON:  None. FINDINGS: Endotracheal tube terminates proximally 1.5 cm above the carina. Heart size appears mildly enlarged. Diffuse bilateral interstitial prominence. No pleural effusion or pneumothorax. Age indeterminate fractures of the posterior right seventh and likely eighth ribs. IMPRESSION: 1. Endotracheal tube terminates 1.5 cm above the carina. 2. Diffuse bilateral interstitial prominence may reflect atelectasis, edema, versus atypical/viral infection. 3. Age indeterminate fractures of the posterior right seventh and likely eighth ribs. Electronically Signed   By: Davina Poke D.O.   On: 05/25/2021  16:03   ECHOCARDIOGRAM COMPLETE  Result Date: 05/26/2021    ECHOCARDIOGRAM REPORT   Patient Name:   Deborah Carey Date of Exam: 05/26/2021 Medical Rec #:  [id number]     Height:       64.0 in Accession #:    [id number]    Weight:       211.6 lb Date of Birth:  17-Apr-1951      BSA:          2.004 m Patient Age:  70 years      BP:           129/61 mmHg Patient Gender: F             HR:           68 bpm. Exam Location:  Inpatient Procedure: 2D Echo, Cardiac Doppler, Color Doppler, Intracardiac Opacification            Agent and 3D Echo Indications:    Stroke I63.9  History:        Patient has no prior history of Echocardiogram examinations.                 Risk Factors:Hypertension. Acute respiratory insufficiency                 secondary to acute encephalopathy in the setting of CVA. Acute                 left MCA stroke s/p TNK and mechanical thrombectomy with                 complete revascularization. New diagnosis of atrial                 fibrillation, patient has converted to sinus rhythm.  Sonographer:    Darlina Sicilian RDCS Referring Phys: [id number] [place]  1. Left ventricular ejection fraction, by estimation, is 50 to 55%. The left ventricle has low normal function. The left ventricle demonstrates regional wall motion abnormalities - mild septal hypokinesis. There is mild left ventricular hypertrophy of the septal segment. Left ventricular diastolic parameters are indeterminate.  2. Right ventricular systolic function is normal. The right ventricular size is normal. There is normal pulmonary artery systolic pressure. The estimated right ventricular systolic pressure is 14.7 mmHg.  3. The mitral valve is normal in structure. Trivial mitral valve regurgitation. No evidence of mitral stenosis.  4. Tricuspid valve regurgitation is mild to moderate.  5. The aortic valve is grossly normal. Aortic valve regurgitation is not visualized. No aortic stenosis is present.  6. The inferior vena cava  is normal in size with <50% respiratory variability, suggesting right atrial pressure of 8 mmHg. FINDINGS  Left Ventricle: Left ventricular ejection fraction, by estimation, is 50 to 55%. The left ventricle has low normal function. The left ventricle demonstrates regional wall motion abnormalities. 3D left ventricular ejection fraction analysis performed but  not reported based on interpreter judgement due to suboptimal quality. The left ventricular internal cavity size was normal in size. There is mild left ventricular hypertrophy of the septal segment. Left ventricular diastolic parameters are indeterminate. Right Ventricle: The right ventricular size is normal. Right vetricular wall thickness was not well visualized. Right ventricular systolic function is normal. There is normal pulmonary artery systolic pressure. The tricuspid regurgitant velocity is 2.31 m/s, and with an assumed right atrial pressure of 8 mmHg, the estimated right ventricular systolic pressure is 82.9 mmHg. Left Atrium: Left atrial size was normal in size. Right Atrium: Right atrial size was normal in size. Pericardium: There is no evidence of pericardial effusion. Mitral Valve: The mitral valve is normal in structure. Mild mitral annular calcification. Trivial mitral valve regurgitation. No evidence of mitral valve stenosis. Tricuspid Valve: The tricuspid valve is normal in structure. Tricuspid valve regurgitation is mild to moderate. No evidence of tricuspid stenosis. Aortic Valve: The aortic valve is grossly normal. Aortic valve regurgitation is not visualized. No aortic stenosis is present. Pulmonic Valve: The pulmonic valve was  not well visualized. Pulmonic valve regurgitation is trivial. No evidence of pulmonic stenosis. Aorta: The aortic root is normal in size and structure. Venous: The inferior vena cava is normal in size with less than 50% respiratory variability, suggesting right atrial pressure of 8 mmHg. IAS/Shunts: No atrial level  shunt detected by color flow Doppler.  LEFT VENTRICLE PLAX 2D LVIDd:         4.70 cm     Diastology LVIDs:         3.50 cm     LV e' medial:    10.10 cm/s LV PW:         0.80 cm     LV E/e' medial:  7.6 LV IVS:        1.10 cm     LV e' lateral:   9.87 cm/s LVOT diam:     2.00 cm     LV E/e' lateral: 7.7 LV SV:         44 LV SV Index:   22 LVOT Area:     3.14 cm  LV Volumes (MOD) LV vol d, MOD A2C: 76.2 ml LV vol d, MOD A4C: 57.2 ml LV vol s, MOD A2C: 20.0 ml LV vol s, MOD A4C: 12.7 ml LV SV MOD A2C:     56.2 ml LV SV MOD A4C:     57.2 ml LV SV MOD BP:      53.3 ml RIGHT VENTRICLE RV S prime:     12.30 cm/s TAPSE (M-mode): 1.7 cm LEFT ATRIUM             Index       RIGHT ATRIUM           Index LA diam:        4.00 cm 2.00 cm/m  RA Area:     12.10 cm LA Vol (A2C):   47.0 ml 23.45 ml/m RA Volume:   23.00 ml  11.48 ml/m LA Vol (A4C):   64.2 ml 32.04 ml/m LA Biplane Vol: 54.5 ml 27.20 ml/m  AORTIC VALVE LVOT Vmax:   71.68 cm/s LVOT Vmean:  45.300 cm/s LVOT VTI:    0.138 m  AORTA Ao Root diam: 3.00 cm Ao Asc diam:  2.80 cm MITRAL VALVE               TRICUSPID VALVE MV Area (PHT): 3.34 cm    TR Peak grad:   21.3 mmHg MV Decel Time: 227 msec    TR Vmax:        231.00 cm/s MV E velocity: 76.47 cm/s                            SHUNTS                            Systemic VTI:  0.14 m                            Systemic Diam: 2.00 cm Cherlynn Kaiser MD Electronically signed by Cherlynn Kaiser MD Signature Date/Time: 05/26/2021/5:49:00 PM    Final    IR PERCUTANEOUS ART THROMBECTOMY/INFUSION INTRACRANIAL INC DIAG ANGIO  Result Date: 05/25/2021 INDICATION: Deborah Carey is an 70 year-old female with a past medical history significant for osteoarthritis and cataracts who presented emergently to the ED at [facility] due to right side weakness, with eyes  drifting to left, and unable to talk with decreased level of consciousness. NIHSS at presentation was 19; baseline modified Rankin scale 0. Head CT showed no evidence of a large  acute infarct or hemorrhage (ASPECTS 10). CTA showed a proximal left M1/MCA occlusion with good collaterals. She receive IV TNK and was then transferred to our service for a diagnostic cerebral angiogram and mechanical thrombectomy. EXAM: ULTRASOUND GUIDED VASCULAR ACCESS DIAGNOSTIC CEREBRAL ANGIOGRAM MECHANICAL THROMBECTOMY FLAT PANEL HEAD CT COMPARISON:  CT/CTA 05/25/2021. MEDICATIONS: No antibiotic administered. ANESTHESIA/SEDATION: The procedure was performed under general anesthesia. CONTRAST:  25 mL of Omnipaque 240 mg/mL FLUOROSCOPY TIME:  Fluoroscopy Time: 4 minutes 36 seconds (420 mGy). COMPLICATIONS: None immediate. TECHNIQUE: Informed written consent was obtained from the patient's husband after a thorough discussion of the procedural risks, benefits and alternatives. All questions were addressed. Maximal Sterile Barrier Technique was utilized including caps, mask, sterile gowns, sterile gloves, sterile drape, hand hygiene and skin antiseptic. A timeout was performed prior to the initiation of the procedure. The right groin was prepped and draped in the usual sterile fashion. Using a micropuncture kit and the modified Seldinger technique, access was gained to the right common femoral artery and an 8 French sheath was placed. Real-time ultrasound guidance was utilized for vascular access including the acquisition of a permanent ultrasound image documenting patency of the accessed vessel. Under fluoroscopy, a Zoom 88 guide catheter was navigated over a 6 [place] Berenstein 2 catheter and a 0.035" Terumo Glidewire into the aortic arch. The catheter was placed into the left common carotid artery and then advanced into the left internal carotid artery. The Berenstein 2 catheter was removed. Frontal and lateral angiograms of the head were obtained. FINDINGS: 1. Normal caliber of the right common femoral artery. 2. Occlusion of the left MCA at the proximal M1 segment. PROCEDURE: Under biplane roadmap, a zoom 71  aspiration catheter was navigated over an Aristotle 24 microguidewire into the cavernous segment of the left ICA. The aspiration catheter was then advanced to the level of occlusion and connected to an aspiration pump. Continuous aspiration was performed for 2 minutes. The guide catheter was connected to a VacLok syringe and advanced into the M1 segment while the aspiration catheter wasremoved under constant aspiration. The Zoom 88 guide catheter was continuously aspirated until clear blood return was obtained. Left ICA angiograms with frontal and lateral views showed complete left MCA vascular tree recanalization without evidence of thromboembolic complication. Flat panel CT of the head was obtained and post processed in a separate workstation with concurrent attending physician supervision. Selected images were sent to PACS. No evidence of hemorrhagic complication. A right common femoral artery angiogram was then obtained in right anterior oblique view via sheath side port. The puncture is at the level of the common femoral artery which has normal caliber and contour, adequate for closure device. The femoral sheath was exchanged over the wire for a Perclose ProStyle which was utilized for access closure. Immediate hemostasis was achieved. IMPRESSION: Successful and uncomplicated mechanical thrombectomy performed with direct contact aspiration for treatment of a proximal left M1/MCA occlusion with complete recanalization (TICI3). PLAN: 1. Transfer to ICA for continued care. 2. SBP 120-140 mmHg for 24 hours post procedure. 3. Bed rest for 6 hours. Electronically Signed   By: Pedro Earls M.D.   On: 05/25/2021 17:58   CT HEAD CODE STROKE WO CONTRAST  Result Date: 05/25/2021 CLINICAL DATA:  Left-sided weakness EXAM: CT ANGIOGRAPHY HEAD AND NECK TECHNIQUE: Multidetector CT imaging  of the head and neck was performed using the standard protocol during bolus administration of intravenous contrast.  Multiplanar CT image reconstructions and MIPs were obtained to evaluate the vascular anatomy. Carotid stenosis measurements (when applicable) are obtained utilizing NASCET criteria, using the distal internal carotid diameter as the denominator. CONTRAST:  41m OMNIPAQUE IOHEXOL 350 MG/ML SOLN COMPARISON:  None. FINDINGS: CT HEAD FINDINGS Brain: There is ill-defined hypodensity in the left centrum semiovale. The left basal ganglia and insular cortex are preserved. The cortex throughout the left MCA distribution is preserved. There is focal hypodensity in the right parietal lobe likely reflecting remote infarct. Additional foci of hypodensity in the bilateral cerebral hemispheres are nonspecific but may reflect sequela of chronic white matter microangiopathy. There is no evidence of acute intracranial hemorrhage or extra-axial fluid collection. The ventricles are not enlarged. There is no mass lesion. There is no midline shift. Vascular: There is a dense MCA on the left. See below. Skull: Normal. Negative for fracture or focal lesion. Sinuses/orbits: The imaged paranasal sinuses are clear. The mastoid air cells are clear. Bilateral lens implants are in place. The globes and orbits are otherwise unremarkable. Review of the MIP images confirms the above findings CTA NECK FINDINGS The CTA images are moderately degraded by motion artifact. Aortic arch: Standard branching. Imaged portion shows no evidence of aneurysm or dissection. No significant stenosis of the major arch vessel origins. Right carotid system: The right carotid system is suboptimally evaluated particularly in the region of the carotid bulb due to motion artifact. The right internal carotid artery has a medialized course. Otherwise, there is no definite focal stenosis, occlusion, dissection, or aneurysm. Left carotid system: The left carotid system is suboptimally evaluated due to motion artifact particularly in the region of the carotid bulb. There is  calcified atherosclerotic plaque of the left carotid bulb without definite hemodynamically significant stenosis or occlusion. There is no definite dissection or aneurysm. Vertebral arteries: The vertebral arteries appear patent, without definite focal stenosis, dissection, occlusion, or aneurysm. Skeleton: There is mild multilevel degenerative change of the cervical spine. There is no acute osseous abnormality or aggressive osseous lesion. Other neck: The soft tissues are unremarkable. Upper chest: There is a 1.1 cm x 1.0 cm soft tissue nodule in the medial right apex. Review of the MIP images confirms the above findings CTA HEAD FINDINGS Anterior circulation: There is calcification of the bilateral cavernous ICAs without focal stenosis or occlusion. There is occlusion of the proximal left M1 segment with reconstitution of flow at the M2 segments. There appears to be good collateral flow throughout the peripheral MCA distribution. The right MCA is patent. The bilateral ACAs are patent. The right A1 segment is diminutive. There is no aneurysm. Posterior circulation: The posterior circulation is suboptimally evaluated due to motion artifact. The V4 segments of the vertebral arteries are patent. The basilar artery is patent. There is multifocal moderate stenosis of the left P2 segment (9-256, 10-116, 10-123). The right PCA is patent.  There is no aneurysm. Venous sinuses: As permitted by contrast timing, patent. Anatomic variants: None. Review of the MIP images confirms the above findings IMPRESSION: 1. Occlusion of the left M1 segment with reconstitution of flow at the M2 segments with good collateral flow throughout the remainder of the MCA distribution. 2. No evidence of left MCA territory infarct at the time of imaging. Aspects is 10. 3. The CTA images are moderately motion degraded. There is no definite high-grade stenosis in the neck. There is multifocal moderate  stenosis of the left P2 segment as above. 4. Small  remote infarct in the right parietal lobe. Other foci of hypodensity in the supratentorial white matter likely reflects sequela of chronic white matter microangiopathy. 5. 1.1 cm soft tissue nodule in the medial right apex. Consider one of the following in 3 months for both low-risk and high-risk individuals: (a) repeat chest CT, (b) follow-up PET-CT, or (c) tissue sampling. This recommendation follows the consensus statement: Guidelines for Management of Incidental Pulmonary Nodules Detected on CT Images: From the Fleischner Society 2017; Radiology 2017; 284:228-243. The acute results were called by telephone at the time of interpretation on 05/25/2021 at 3:05 pm to provider Dr Cheral Marker , who verbally acknowledged these results. Electronically Signed   By: Valetta Mole M.D.   On: 05/25/2021 15:22   CT ANGIO HEAD NECK W WO CM (CODE STROKE)  Result Date: 05/25/2021 CLINICAL DATA:  Left-sided weakness EXAM: CT ANGIOGRAPHY HEAD AND NECK TECHNIQUE: Multidetector CT imaging of the head and neck was performed using the standard protocol during bolus administration of intravenous contrast. Multiplanar CT image reconstructions and MIPs were obtained to evaluate the vascular anatomy. Carotid stenosis measurements (when applicable) are obtained utilizing NASCET criteria, using the distal internal carotid diameter as the denominator. CONTRAST:  70m OMNIPAQUE IOHEXOL 350 MG/ML SOLN COMPARISON:  None. FINDINGS: CT HEAD FINDINGS Brain: There is ill-defined hypodensity in the left centrum semiovale. The left basal ganglia and insular cortex are preserved. The cortex throughout the left MCA distribution is preserved. There is focal hypodensity in the right parietal lobe likely reflecting remote infarct. Additional foci of hypodensity in the bilateral cerebral hemispheres are nonspecific but may reflect sequela of chronic white matter microangiopathy. There is no evidence of acute intracranial hemorrhage or extra-axial fluid  collection. The ventricles are not enlarged. There is no mass lesion. There is no midline shift. Vascular: There is a dense MCA on the left. See below. Skull: Normal. Negative for fracture or focal lesion. Sinuses/orbits: The imaged paranasal sinuses are clear. The mastoid air cells are clear. Bilateral lens implants are in place. The globes and orbits are otherwise unremarkable. Review of the MIP images confirms the above findings CTA NECK FINDINGS The CTA images are moderately degraded by motion artifact. Aortic arch: Standard branching. Imaged portion shows no evidence of aneurysm or dissection. No significant stenosis of the major arch vessel origins. Right carotid system: The right carotid system is suboptimally evaluated particularly in the region of the carotid bulb due to motion artifact. The right internal carotid artery has a medialized course. Otherwise, there is no definite focal stenosis, occlusion, dissection, or aneurysm. Left carotid system: The left carotid system is suboptimally evaluated due to motion artifact particularly in the region of the carotid bulb. There is calcified atherosclerotic plaque of the left carotid bulb without definite hemodynamically significant stenosis or occlusion. There is no definite dissection or aneurysm. Vertebral arteries: The vertebral arteries appear patent, without definite focal stenosis, dissection, occlusion, or aneurysm. Skeleton: There is mild multilevel degenerative change of the cervical spine. There is no acute osseous abnormality or aggressive osseous lesion. Other neck: The soft tissues are unremarkable. Upper chest: There is a 1.1 cm x 1.0 cm soft tissue nodule in the medial right apex. Review of the MIP images confirms the above findings CTA HEAD FINDINGS Anterior circulation: There is calcification of the bilateral cavernous ICAs without focal stenosis or occlusion. There is occlusion of the proximal left M1 segment with reconstitution of flow at the  M2 segments. There appears to be good collateral flow throughout the peripheral MCA distribution. The right MCA is patent. The bilateral ACAs are patent. The right A1 segment is diminutive. There is no aneurysm. Posterior circulation: The posterior circulation is suboptimally evaluated due to motion artifact. The V4 segments of the vertebral arteries are patent. The basilar artery is patent. There is multifocal moderate stenosis of the left P2 segment (9-256, 10-116, 10-123). The right PCA is patent.  There is no aneurysm. Venous sinuses: As permitted by contrast timing, patent. Anatomic variants: None. Review of the MIP images confirms the above findings IMPRESSION: 1. Occlusion of the left M1 segment with reconstitution of flow at the M2 segments with good collateral flow throughout the remainder of the MCA distribution. 2. No evidence of left MCA territory infarct at the time of imaging. Aspects is 10. 3. The CTA images are moderately motion degraded. There is no definite high-grade stenosis in the neck. 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 4. 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 Other foci of hypodensity in the supratentorial white matter likely reflects sequela of chronic white matter microangiopathy. 5. 1.1 cm soft tissue nodule in the medial right apex. Consider one of the following in 3 months for both low-risk and high-risk individuals: (a) repeat chest CT, (b) follow-up PET-CT, or (c) tissue sampling. This recommendation follows the consensus statement: Guidelines for Management of Incidental Pulmonary Nodules Detected on CT Images: From the Fleischner Society 2017; Radiology 2017; 284:228-243. The acute results were called by telephone at the time of interpretation on 05/25/2021 at 3:05 pm to provider Dr Cheral Marker , who verbally acknowledged these results. Electronically Signed   By: Valetta Mole M.D.   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  Assessment & Plan    Atrial fibrillation: Well-controlled rate.  Not requiring any rate control medications.  Start anticoagulation when safe from a neurologic standpoint.  No CHF.  Results discussed with family.  Of note, she had an abdominal CT yesterday due to discomfort and this was essentially negative.  Gallstones present.  Results of echo and CT discussed with the family.     For questions or updates, please contact [facility] Please consult [email or web address] for contact info under        Signed, Larae Grooms, MD  05/27/2021, 11:27 AM

## 2021-05-27 NOTE — Progress Notes (Signed)
STROKE TEAM PROGRESS NOTE   SUBJECTIVE (INTERVAL HISTORY) Her husband and daughter on at the bedside. Speech therapist and RN are at bedside too.  Patient awake alert, sitting in bed comfortably, working with therapist.  Her speech further improved, right-sided weakness also improved.  Last night, patient had episode of apparently right hip pain or right lower quadrant abdominal pain, agitated, restless, facial flushing, heavy breathing.  In and out urine for 300 cc but not improved, stat CT abdomen pelvis negative.  Patient apparently slept after fentanyl 25 mcg and woke up this morning comfortably.   OBJECTIVE Temp:  [97.6 F (36.4 C)-98.2 F (36.8 C)] 98.2 F (36.8 C) (09/22 1148) Pulse Rate:  [65-112] 96 (09/22 1000) Cardiac Rhythm: Atrial fibrillation (09/22 0800) Resp:  [15-27] 22 (09/22 1000) BP: (98-164)/(45-107) 140/86 (09/22 1000) SpO2:  [90 %-100 %] 93 % (09/22 1000)  Recent Labs  Lab 05/26/21 1644 05/26/21 2022 05/27/21 0001 05/27/21 0349 05/27/21 0733  GLUCAP 88 89 77 92 99   Recent Labs  Lab 05/25/21 1520 05/25/21 1907 05/25/21 2204 05/26/21 0429 05/27/21 0318  NA 138  --  138 137 139  K 4.0  --  4.1 3.9 4.0  CL 102  --   --  103 105  CO2 26  --   --  23 27  GLUCOSE 128*  --   --  146* 90  BUN 18  --   --  14 12  CREATININE 0.83  --   --  0.78 0.77  CALCIUM 9.4  --   --  8.9 8.7*  MG  --  2.1  --   --   --   PHOS  --  3.6  --   --   --    Recent Labs  Lab 05/25/21 1520 05/26/21 0429  AST 20 18  ALT 16 15  ALKPHOS 68 47  BILITOT 0.8 0.6  PROT 7.3 6.1*  ALBUMIN 4.2 3.4*   Recent Labs  Lab 05/25/21 1447 05/25/21 2204 05/26/21 0429 05/27/21 0318  WBC 10.0  --  11.0* 8.1  NEUTROABS 5.9  --   --   --   HGB 14.8 12.9 13.1 11.6*  HCT 40.3 38.0 38.6 34.5*  MCV 92.2  --  93.5 94.8  PLT 267  --  206 177   No results for input(s): CKTOTAL, CKMB, CKMBINDEX, TROPONINI in the last 168 hours. Recent Labs    05/25/21 1447  LABPROT 12.6  INR 0.9    No results for input(s): COLORURINE, LABSPEC, PHURINE, GLUCOSEU, HGBUR, BILIRUBINUR, KETONESUR, PROTEINUR, UROBILINOGEN, NITRITE, LEUKOCYTESUR in the last 72 hours.  Invalid input(s): APPERANCEUR     Component Value Date/Time   CHOL 171 05/26/2021 0429   TRIG 64 05/26/2021 0429   TRIG 66 05/26/2021 0429   HDL 49 05/26/2021 0429   CHOLHDL 3.5 05/26/2021 0429   VLDL 13 05/26/2021 0429   LDLCALC 109 (H) 05/26/2021 0429   Lab Results  Component Value Date   HGBA1C 5.8 (H) 05/26/2021   No results found for: LABOPIA, COCAINSCRNUR, [place], [place], [place], [place]  Recent Labs  Lab 05/25/21 [place] <10    I have personally reviewed the radiological images below and agree with the radiology interpretations.  CT ABDOMEN PELVIS W WO CONTRAST  Result Date: 05/26/2021 CLINICAL DATA:  Abdominal pain. EXAM: CT ABDOMEN AND PELVIS WITHOUT AND WITH CONTRAST TECHNIQUE: Multidetector CT imaging of the abdomen and pelvis was performed following the standard protocol before and following the bolus  administration of intravenous contrast. CONTRAST:  49m OMNIPAQUE IOHEXOL 350 MG/ML SOLN COMPARISON:  None. FINDINGS: Lower chest: The lung bases are clear of an acute process. No pleural effusions or pulmonary lesions. The heart is within normal limits in size. No pericardial effusion. Hepatobiliary: No hepatic lesions or intrahepatic biliary dilatation. High attenuation material in the gallbladder likely due to vicarious excretion of contrast material from prior CT scan and interventional procedure. There is a 2.4 cm gallstone noted the gallbladder but no findings suspicious for acute cholecystitis. No common bile duct dilatation. Pancreas: No mass, inflammation or ductal dilatation. Spleen: Normal size.  No focal lesions. Adrenals/Urinary Tract: Adrenal glands and kidneys are unremarkable. There is some contrast in both collecting systems. The bladder also contains contrast material. No bladder mass or  asymmetric bladder wall thickening. Stomach/Bowel: The stomach, duodenum, small bowel and colon are unremarkable. No acute inflammatory changes, mass lesions or obstructive findings. The terminal ileum is normal. The appendix is normal. Scattered colonic diverticulosis but no findings for acute diverticulitis. Vascular/Lymphatic: Scattered atherosclerotic calcifications but no aneurysm or dissection. The major venous structures are patent. No mesenteric or retroperitoneal mass or adenopathy. Reproductive: The uterus and ovaries are unremarkable. Other: No pelvic mass or adenopathy. No free pelvic fluid collections. No inguinal mass or adenopathy. Small periumbilical abdominal wall hernia containing fat. Musculoskeletal: No significant bony findings. IMPRESSION: 1. No acute abdominal/pelvic findings, mass lesions or adenopathy. 2. Cholelithiasis without findings suspicious for acute cholecystitis. 3. Scattered colonic diverticulosis but no findings for acute diverticulitis. Electronically Signed   By: PMarijo SanesM.D.   On: 05/26/2021 20:30   MR ANGIO HEAD WO CONTRAST  Result Date: 05/26/2021 CLINICAL DATA:  Follow-up stroke. Left-sided weakness. Left MCA occlusion with intervention. EXAM: MRA HEAD WITHOUT CONTRAST TECHNIQUE: Angiographic images of the Circle of Willis were acquired using MRA technique without intravenous contrast. COMPARISON:  MRI earlier same day FINDINGS: Anterior circulation: Both internal carotid arteries are widely patent through the skull base and siphon regions. The anterior and middle cerebral vessels are patent. Reconstituted flow in the left MCA without proximal stenosis or missing distal branch vessels. Posterior circulation: Both vertebral arteries widely patent to the basilar. No basilar stenosis. Stenoses in the PCA P2 segments, worse on the left than the right. Anatomic variants: None significant. Other: None. IMPRESSION: Good appearance of the anterior circulation flow  presently. Status post mechanical thrombectomy on the left. Wide patency of the left MCA branches presently. 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 Electronically Signed   By: MNelson ChimesM.D.   On: 05/26/2021 15:18   MR BRAIN WO CONTRAST  Result Date: 05/26/2021 CLINICAL DATA:  Follow-up stroke. EXAM: MRI HEAD WITHOUT CONTRAST TECHNIQUE: Multiplanar, multiecho pulse sequences of the brain and surrounding structures were obtained without intravenous contrast. COMPARISON:  MR angiography same day. CT studies and intervention done yesterday. FINDINGS: Brain: There is acute infarction affecting the corpus stratum on the left. Minimal punctate infarction in the left posterior frontal subcortical white matter. Swelling of the putamen and caudate, with minimal petechial blood products but without frank hemorrhage. Three punctate foci of acute infarction in the right medial frontal lobe, possibly small embolic infarctions in the right anterior cerebral artery territory. No midline shift. Elsewhere, there is old cortical and subcortical infarction in the right parietal lobe and moderate chronic small-vessel ischemic changes of the cerebral hemispheric white matter. No hydrocephalus. No extra-axial collection. Vascular: Major vessels at the base of the brain show flow.  Skull and upper cervical spine: Negative Sinuses/Orbits: Clear/normal Other: None IMPRESSION: Acute infarction of the left putamen and caudate. Minimal petechial blood products but no frank hematoma. Mild swelling but no midline shift. Three punctate acute infarctions within the medial right frontal lobe, probably micro embolic infarctions in the anterior cerebral artery territory. Old infarction in the right parietal cortical and subcortical brain and seen affecting the cerebral hemispheric deep white matter extensively. Electronically Signed   By: Nelson Chimes M.D.   On: 05/26/2021 15:51   IR CT Head Ltd  Result Date:  05/25/2021 INDICATION: NINA MONDOR is an 69 year-old female with a past medical history significant for osteoarthritis and cataracts who presented emergently to the ED at [facility] due to right side weakness, with eyes drifting to left, and unable to talk with decreased level of consciousness. NIHSS at presentation was 19; baseline modified Rankin scale 0. Head CT showed no evidence of a large acute infarct or hemorrhage (ASPECTS 10). CTA showed a proximal left M1/MCA occlusion with good collaterals. She receive IV TNK and was then transferred to our service for a diagnostic cerebral angiogram and mechanical thrombectomy. EXAM: ULTRASOUND GUIDED VASCULAR ACCESS DIAGNOSTIC CEREBRAL ANGIOGRAM MECHANICAL THROMBECTOMY FLAT PANEL HEAD CT COMPARISON:  CT/CTA 05/25/2021. MEDICATIONS: No antibiotic administered. ANESTHESIA/SEDATION: The procedure was performed under general anesthesia. CONTRAST:  25 mL of Omnipaque 240 mg/mL FLUOROSCOPY TIME:  Fluoroscopy Time: 4 minutes 36 seconds (420 mGy). COMPLICATIONS: None immediate. TECHNIQUE: Informed written consent was obtained from the patient's husband after a thorough discussion of the procedural risks, benefits and alternatives. All questions were addressed. Maximal Sterile Barrier Technique was utilized including caps, mask, sterile gowns, sterile gloves, sterile drape, hand hygiene and skin antiseptic. A timeout was performed prior to the initiation of the procedure. The right groin was prepped and draped in the usual sterile fashion. Using a micropuncture kit and the modified Seldinger technique, access was gained to the right common femoral artery and an 8 French sheath was placed. Real-time ultrasound guidance was utilized for vascular access including the acquisition of a permanent ultrasound image documenting patency of the accessed vessel. Under fluoroscopy, a Zoom 88 guide catheter was navigated over a 6 [place] Berenstein 2 catheter and a 0.035" Terumo Glidewire into the  aortic arch. The catheter was placed into the left common carotid artery and then advanced into the left internal carotid artery. The Berenstein 2 catheter was removed. Frontal and lateral angiograms of the head were obtained. FINDINGS: 1. Normal caliber of the right common femoral artery. 2. Occlusion of the left MCA at the proximal M1 segment. PROCEDURE: Under biplane roadmap, a zoom 71 aspiration catheter was navigated over an Aristotle 24 microguidewire into the cavernous segment of the left ICA. The aspiration catheter was then advanced to the level of occlusion and connected to an aspiration pump. Continuous aspiration was performed for 2 minutes. The guide catheter was connected to a VacLok syringe and advanced into the M1 segment while the aspiration catheter wasremoved under constant aspiration. The Zoom 88 guide catheter was continuously aspirated until clear blood return was obtained. Left ICA angiograms with frontal and lateral views showed complete left MCA vascular tree recanalization without evidence of thromboembolic complication. Flat panel CT of the head was obtained and post processed in a separate workstation with concurrent attending physician supervision. Selected images were sent to PACS. No evidence of hemorrhagic complication. A right common femoral artery angiogram was then obtained in right anterior oblique view via sheath side port. The  puncture is at the level of the common femoral artery which has normal caliber and contour, adequate for closure device. The femoral sheath was exchanged over the wire for a Perclose ProStyle which was utilized for access closure. Immediate hemostasis was achieved. IMPRESSION: Successful and uncomplicated mechanical thrombectomy performed with direct contact aspiration for treatment of a proximal left M1/MCA occlusion with complete recanalization (TICI3). PLAN: 1. Transfer to ICA for continued care. 2. SBP 120-140 mmHg for 24 hours post procedure. 3. Bed  rest for 6 hours. Electronically Signed   By: Pedro Earls M.D.   On: 05/25/2021 17:58   IR US Guide Vasc Access Right  Result Date: 05/25/2021 INDICATION: AAMINAH FORRESTER is an 70 year-old female with a past medical history significant for osteoarthritis and cataracts who presented emergently to the ED at [facility] due to right side weakness, with eyes drifting to left, and unable to talk with decreased level of consciousness. NIHSS at presentation was 19; baseline modified Rankin scale 0. Head CT showed no evidence of a large acute infarct or hemorrhage (ASPECTS 10). CTA showed a proximal left M1/MCA occlusion with good collaterals. She receive IV TNK and was then transferred to our service for a diagnostic cerebral angiogram and mechanical thrombectomy. EXAM: ULTRASOUND GUIDED VASCULAR ACCESS DIAGNOSTIC CEREBRAL ANGIOGRAM MECHANICAL THROMBECTOMY FLAT PANEL HEAD CT COMPARISON:  CT/CTA 05/25/2021. MEDICATIONS: No antibiotic administered. ANESTHESIA/SEDATION: The procedure was performed under general anesthesia. CONTRAST:  25 mL of Omnipaque 240 mg/mL FLUOROSCOPY TIME:  Fluoroscopy Time: 4 minutes 36 seconds (420 mGy). COMPLICATIONS: None immediate. TECHNIQUE: Informed written consent was obtained from the patient's husband after a thorough discussion of the procedural risks, benefits and alternatives. All questions were addressed. Maximal Sterile Barrier Technique was utilized including caps, mask, sterile gowns, sterile gloves, sterile drape, hand hygiene and skin antiseptic. A timeout was performed prior to the initiation of the procedure. The right groin was prepped and draped in the usual sterile fashion. Using a micropuncture kit and the modified Seldinger technique, access was gained to the right common femoral artery and an 8 French sheath was placed. Real-time ultrasound guidance was utilized for vascular access including the acquisition of a permanent ultrasound image documenting patency of  the accessed vessel. Under fluoroscopy, a Zoom 88 guide catheter was navigated over a 6 [place] Berenstein 2 catheter and a 0.035" Terumo Glidewire into the aortic arch. The catheter was placed into the left common carotid artery and then advanced into the left internal carotid artery. The Berenstein 2 catheter was removed. Frontal and lateral angiograms of the head were obtained. FINDINGS: 1. Normal caliber of the right common femoral artery. 2. Occlusion of the left MCA at the proximal M1 segment. PROCEDURE: Under biplane roadmap, a zoom 71 aspiration catheter was navigated over an Aristotle 24 microguidewire into the cavernous segment of the left ICA. The aspiration catheter was then advanced to the level of occlusion and connected to an aspiration pump. Continuous aspiration was performed for 2 minutes. The guide catheter was connected to a VacLok syringe and advanced into the M1 segment while the aspiration catheter wasremoved under constant aspiration. The Zoom 88 guide catheter was continuously aspirated until clear blood return was obtained. Left ICA angiograms with frontal and lateral views showed complete left MCA vascular tree recanalization without evidence of thromboembolic complication. Flat panel CT of the head was obtained and post processed in a separate workstation with concurrent attending physician supervision. Selected images were sent to PACS. No evidence of hemorrhagic complication.  A right common femoral artery angiogram was then obtained in right anterior oblique view via sheath side port. The puncture is at the level of the common femoral artery which has normal caliber and contour, adequate for closure device. The femoral sheath was exchanged over the wire for a Perclose ProStyle which was utilized for access closure. Immediate hemostasis was achieved. IMPRESSION: Successful and uncomplicated mechanical thrombectomy performed with direct contact aspiration for treatment of a proximal left  M1/MCA occlusion with complete recanalization (TICI3). PLAN: 1. Transfer to ICA for continued care. 2. SBP 120-140 mmHg for 24 hours post procedure. 3. Bed rest for 6 hours. Electronically Signed   By: Pedro Earls M.D.   On: 05/25/2021 17:58   DG CHEST PORT 1 VIEW  Result Date: 05/26/2021 CLINICAL DATA:  Status post intubation. EXAM: PORTABLE CHEST 1 VIEW COMPARISON:  05/25/2021 FINDINGS: The ETT tip is 1.2 cm above the carina. Normal heart size. Interval resolution of previous interstitial and airspace opacities. Remote appearing right posterior rib fractures. IMPRESSION: 1. Stable position of ET tube with tip above the carina. 2. Interval clearing of interstitial edema. Electronically Signed   By: Kerby Moors M.D.   On: 05/26/2021 05:59   DG Chest Portable 1 View  Result Date: 05/25/2021 CLINICAL DATA:  Shortness of breath EXAM: PORTABLE CHEST 1 VIEW COMPARISON:  None. FINDINGS: Endotracheal tube terminates proximally 1.5 cm above the carina. Heart size appears mildly enlarged. Diffuse bilateral interstitial prominence. No pleural effusion or pneumothorax. Age indeterminate fractures of the posterior right seventh and likely eighth ribs. IMPRESSION: 1. Endotracheal tube terminates 1.5 cm above the carina. 2. Diffuse bilateral interstitial prominence may reflect atelectasis, edema, versus atypical/viral infection. 3. Age indeterminate fractures of the posterior right seventh and likely eighth ribs. Electronically Signed   By: Davina Poke D.O.   On: 05/25/2021 16:03   ECHOCARDIOGRAM COMPLETE  Result Date: 05/26/2021    ECHOCARDIOGRAM REPORT   Patient Name:   MARIZA BOURGET Date of Exam: 05/26/2021 Medical Rec #:  [id number]     Height:       64.0 in Accession #:    [id number]    Weight:       211.6 lb Date of Birth:  July 14, 1951      BSA:          2.004 m Patient Age:    2 years      BP:           129/61 mmHg Patient Gender: F             HR:           68 bpm. Exam Location:   Inpatient Procedure: 2D Echo, Cardiac Doppler, Color Doppler, Intracardiac Opacification            Agent and 3D Echo Indications:    Stroke I63.9  History:        Patient has no prior history of Echocardiogram examinations.                 Risk Factors:Hypertension. Acute respiratory insufficiency                 secondary to acute encephalopathy in the setting of CVA. Acute                 left MCA stroke s/p TNK and mechanical thrombectomy with                 complete revascularization. New diagnosis of  atrial                 fibrillation, patient has converted to sinus rhythm.  Sonographer:    Darlina Sicilian RDCS Referring Phys: [id number] [place]  1. Left ventricular ejection fraction, by estimation, is 50 to 55%. The left ventricle has low normal function. The left ventricle demonstrates regional wall motion abnormalities - mild septal hypokinesis. There is mild left ventricular hypertrophy of the septal segment. Left ventricular diastolic parameters are indeterminate.  2. Right ventricular systolic function is normal. The right ventricular size is normal. There is normal pulmonary artery systolic pressure. The estimated right ventricular systolic pressure is 45.4 mmHg.  3. The mitral valve is normal in structure. Trivial mitral valve regurgitation. No evidence of mitral stenosis.  4. Tricuspid valve regurgitation is mild to moderate.  5. The aortic valve is grossly normal. Aortic valve regurgitation is not visualized. No aortic stenosis is present.  6. The inferior vena cava is normal in size with <50% respiratory variability, suggesting right atrial pressure of 8 mmHg. FINDINGS  Left Ventricle: Left ventricular ejection fraction, by estimation, is 50 to 55%. The left ventricle has low normal function. The left ventricle demonstrates regional wall motion abnormalities. 3D left ventricular ejection fraction analysis performed but  not reported based on interpreter judgement due to suboptimal  quality. The left ventricular internal cavity size was normal in size. There is mild left ventricular hypertrophy of the septal segment. Left ventricular diastolic parameters are indeterminate. Right Ventricle: The right ventricular size is normal. Right vetricular wall thickness was not well visualized. Right ventricular systolic function is normal. There is normal pulmonary artery systolic pressure. The tricuspid regurgitant velocity is 2.31 m/s, and with an assumed right atrial pressure of 8 mmHg, the estimated right ventricular systolic pressure is 09.8 mmHg. Left Atrium: Left atrial size was normal in size. Right Atrium: Right atrial size was normal in size. Pericardium: There is no evidence of pericardial effusion. Mitral Valve: The mitral valve is normal in structure. Mild mitral annular calcification. Trivial mitral valve regurgitation. No evidence of mitral valve stenosis. Tricuspid Valve: The tricuspid valve is normal in structure. Tricuspid valve regurgitation is mild to moderate. No evidence of tricuspid stenosis. Aortic Valve: The aortic valve is grossly normal. Aortic valve regurgitation is not visualized. No aortic stenosis is present. Pulmonic Valve: The pulmonic valve was not well visualized. Pulmonic valve regurgitation is trivial. No evidence of pulmonic stenosis. Aorta: The aortic root is normal in size and structure. Venous: The inferior vena cava is normal in size with less than 50% respiratory variability, suggesting right atrial pressure of 8 mmHg. IAS/Shunts: No atrial level shunt detected by color flow Doppler.  LEFT VENTRICLE PLAX 2D LVIDd:         4.70 cm     Diastology LVIDs:         3.50 cm     LV e' medial:    10.10 cm/s LV PW:         0.80 cm     LV E/e' medial:  7.6 LV IVS:        1.10 cm     LV e' lateral:   9.87 cm/s LVOT diam:     2.00 cm     LV E/e' lateral: 7.7 LV SV:         44 LV SV Index:   22 LVOT Area:     3.14 cm  LV Volumes (MOD) LV vol d, MOD A2C:  76.2 ml LV vol d, MOD  A4C: 57.2 ml LV vol s, MOD A2C: 20.0 ml LV vol s, MOD A4C: 12.7 ml LV SV MOD A2C:     56.2 ml LV SV MOD A4C:     57.2 ml LV SV MOD BP:      53.3 ml RIGHT VENTRICLE RV S prime:     12.30 cm/s TAPSE (M-mode): 1.7 cm LEFT ATRIUM             Index       RIGHT ATRIUM           Index LA diam:        4.00 cm 2.00 cm/m  RA Area:     12.10 cm LA Vol (A2C):   47.0 ml 23.45 ml/m RA Volume:   23.00 ml  11.48 ml/m LA Vol (A4C):   64.2 ml 32.04 ml/m LA Biplane Vol: 54.5 ml 27.20 ml/m  AORTIC VALVE LVOT Vmax:   71.68 cm/s LVOT Vmean:  45.300 cm/s LVOT VTI:    0.138 m  AORTA Ao Root diam: 3.00 cm Ao Asc diam:  2.80 cm MITRAL VALVE               TRICUSPID VALVE MV Area (PHT): 3.34 cm    TR Peak grad:   21.3 mmHg MV Decel Time: 227 msec    TR Vmax:        231.00 cm/s MV E velocity: 76.47 cm/s                            SHUNTS                            Systemic VTI:  0.14 m                            Systemic Diam: 2.00 cm Cherlynn Kaiser MD Electronically signed by Cherlynn Kaiser MD Signature Date/Time: 05/26/2021/5:49:00 PM    Final    IR PERCUTANEOUS ART THROMBECTOMY/INFUSION INTRACRANIAL INC DIAG ANGIO  Result Date: 05/25/2021 INDICATION: CHONITA GADEA is an 70 year-old female with a past medical history significant for osteoarthritis and cataracts who presented emergently to the ED at [facility] due to right side weakness, with eyes drifting to left, and unable to talk with decreased level of consciousness. NIHSS at presentation was 19; baseline modified Rankin scale 0. Head CT showed no evidence of a large acute infarct or hemorrhage (ASPECTS 10). CTA showed a proximal left M1/MCA occlusion with good collaterals. She receive IV TNK and was then transferred to our service for a diagnostic cerebral angiogram and mechanical thrombectomy. EXAM: ULTRASOUND GUIDED VASCULAR ACCESS DIAGNOSTIC CEREBRAL ANGIOGRAM MECHANICAL THROMBECTOMY FLAT PANEL HEAD CT COMPARISON:  CT/CTA 05/25/2021. MEDICATIONS: No antibiotic administered.  ANESTHESIA/SEDATION: The procedure was performed under general anesthesia. CONTRAST:  25 mL of Omnipaque 240 mg/mL FLUOROSCOPY TIME:  Fluoroscopy Time: 4 minutes 36 seconds (420 mGy). COMPLICATIONS: None immediate. TECHNIQUE: Informed written consent was obtained from the patient's husband after a thorough discussion of the procedural risks, benefits and alternatives. All questions were addressed. Maximal Sterile Barrier Technique was utilized including caps, mask, sterile gowns, sterile gloves, sterile drape, hand hygiene and skin antiseptic. A timeout was performed prior to the initiation of the procedure. The right groin was prepped and draped in the usual sterile fashion. Using a micropuncture kit and the  modified Seldinger technique, access was gained to the right common femoral artery and an 8 French sheath was placed. Real-time ultrasound guidance was utilized for vascular access including the acquisition of a permanent ultrasound image documenting patency of the accessed vessel. Under fluoroscopy, a Zoom 88 guide catheter was navigated over a 6 [place] Berenstein 2 catheter and a 0.035" Terumo Glidewire into the aortic arch. The catheter was placed into the left common carotid artery and then advanced into the left internal carotid artery. The Berenstein 2 catheter was removed. Frontal and lateral angiograms of the head were obtained. FINDINGS: 1. Normal caliber of the right common femoral artery. 2. Occlusion of the left MCA at the proximal M1 segment. PROCEDURE: Under biplane roadmap, a zoom 71 aspiration catheter was navigated over an Aristotle 24 microguidewire into the cavernous segment of the left ICA. The aspiration catheter was then advanced to the level of occlusion and connected to an aspiration pump. Continuous aspiration was performed for 2 minutes. The guide catheter was connected to a VacLok syringe and advanced into the M1 segment while the aspiration catheter wasremoved under constant  aspiration. The Zoom 88 guide catheter was continuously aspirated until clear blood return was obtained. Left ICA angiograms with frontal and lateral views showed complete left MCA vascular tree recanalization without evidence of thromboembolic complication. Flat panel CT of the head was obtained and post processed in a separate workstation with concurrent attending physician supervision. Selected images were sent to PACS. No evidence of hemorrhagic complication. A right common femoral artery angiogram was then obtained in right anterior oblique view via sheath side port. The puncture is at the level of the common femoral artery which has normal caliber and contour, adequate for closure device. The femoral sheath was exchanged over the wire for a Perclose ProStyle which was utilized for access closure. Immediate hemostasis was achieved. IMPRESSION: Successful and uncomplicated mechanical thrombectomy performed with direct contact aspiration for treatment of a proximal left M1/MCA occlusion with complete recanalization (TICI3). PLAN: 1. Transfer to ICA for continued care. 2. SBP 120-140 mmHg for 24 hours post procedure. 3. Bed rest for 6 hours. Electronically Signed   By: Pedro Earls M.D.   On: 05/25/2021 17:58   CT HEAD CODE STROKE WO CONTRAST  Result Date: 05/25/2021 CLINICAL DATA:  Left-sided weakness EXAM: CT ANGIOGRAPHY HEAD AND NECK TECHNIQUE: Multidetector CT imaging of the head and neck was performed using the standard protocol during bolus administration of intravenous contrast. Multiplanar CT image reconstructions and MIPs were obtained to evaluate the vascular anatomy. Carotid stenosis measurements (when applicable) are obtained utilizing NASCET criteria, using the distal internal carotid diameter as the denominator. CONTRAST:  22m OMNIPAQUE IOHEXOL 350 MG/ML SOLN COMPARISON:  None. FINDINGS: CT HEAD FINDINGS Brain: There is ill-defined hypodensity in the left centrum semiovale. The  left basal ganglia and insular cortex are preserved. The cortex throughout the left MCA distribution is preserved. There is focal hypodensity in the right parietal lobe likely reflecting remote infarct. Additional foci of hypodensity in the bilateral cerebral hemispheres are nonspecific but may reflect sequela of chronic white matter microangiopathy. There is no evidence of acute intracranial hemorrhage or extra-axial fluid collection. The ventricles are not enlarged. There is no mass lesion. There is no midline shift. Vascular: There is a dense MCA on the left. See below. Skull: Normal. Negative for fracture or focal lesion. Sinuses/orbits: The imaged paranasal sinuses are clear. The mastoid air cells are clear. Bilateral lens implants are in place. The  globes and orbits are otherwise unremarkable. Review of the MIP images confirms the above findings CTA NECK FINDINGS The CTA images are moderately degraded by motion artifact. Aortic arch: Standard branching. Imaged portion shows no evidence of aneurysm or dissection. No significant stenosis of the major arch vessel origins. Right carotid system: The right carotid system is suboptimally evaluated particularly in the region of the carotid bulb due to motion artifact. The right internal carotid artery has a medialized course. Otherwise, there is no definite focal stenosis, occlusion, dissection, or aneurysm. Left carotid system: The left carotid system is suboptimally evaluated due to motion artifact particularly in the region of the carotid bulb. There is calcified atherosclerotic plaque of the left carotid bulb without definite hemodynamically significant stenosis or occlusion. There is no definite dissection or aneurysm. Vertebral arteries: The vertebral arteries appear patent, without definite focal stenosis, dissection, occlusion, or aneurysm. Skeleton: There is mild multilevel degenerative change of the cervical spine. There is no acute osseous abnormality or  aggressive osseous lesion. Other neck: The soft tissues are unremarkable. Upper chest: There is a 1.1 cm x 1.0 cm soft tissue nodule in the medial right apex. Review of the MIP images confirms the above findings CTA HEAD FINDINGS Anterior circulation: There is calcification of the bilateral cavernous ICAs without focal stenosis or occlusion. There is occlusion of the proximal left M1 segment with reconstitution of flow at the M2 segments. There appears to be good collateral flow throughout the peripheral MCA distribution. The right MCA is patent. The bilateral ACAs are patent. The right A1 segment is diminutive. There is no aneurysm. Posterior circulation: The posterior circulation is suboptimally evaluated due to motion artifact. The V4 segments of the vertebral arteries are patent. The basilar artery is patent. There is multifocal moderate stenosis of the left P2 segment (9-256, 10-116, 10-123). The right PCA is patent.  There is no aneurysm. Venous sinuses: As permitted by contrast timing, patent. Anatomic variants: None. Review of the MIP images confirms the above findings IMPRESSION: 1. Occlusion of the left M1 segment with reconstitution of flow at the M2 segments with good collateral flow throughout the remainder of the MCA distribution. 2. No evidence of left MCA territory infarct at the time of imaging. Aspects is 10. 3. The CTA images are moderately motion degraded. There is no definite high-grade stenosis in the neck. 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 4. 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 Other foci of hypodensity in the supratentorial white matter likely reflects sequela of chronic white matter microangiopathy. 5. 1.1 cm soft tissue nodule in the medial right apex. Consider one of the following in 3 months for both low-risk and high-risk individuals: (a) repeat chest CT, (b) follow-up PET-CT, or (c) tissue sampling. This recommendation follows the  consensus statement: Guidelines for Management of Incidental Pulmonary Nodules Detected on CT Images: From the Fleischner Society 2017; Radiology 2017; 284:228-243. The acute results were called by telephone at the time of interpretation on 05/25/2021 at 3:05 pm to provider Dr Cheral Marker , who verbally acknowledged these results. Electronically Signed   By: Valetta Mole M.D.   On: 05/25/2021 15:22   CT ANGIO HEAD NECK W WO CM (CODE STROKE)  Result Date: 05/25/2021 CLINICAL DATA:  Left-sided weakness EXAM: CT ANGIOGRAPHY HEAD AND NECK TECHNIQUE: Multidetector CT imaging of the head and neck was performed using the standard protocol during bolus administration of intravenous contrast. Multiplanar CT image reconstructions and MIPs were obtained  to evaluate the vascular anatomy. Carotid stenosis measurements (when applicable) are obtained utilizing NASCET criteria, using the distal internal carotid diameter as the denominator. CONTRAST:  20m OMNIPAQUE IOHEXOL 350 MG/ML SOLN COMPARISON:  None. FINDINGS: CT HEAD FINDINGS Brain: There is ill-defined hypodensity in the left centrum semiovale. The left basal ganglia and insular cortex are preserved. The cortex throughout the left MCA distribution is preserved. There is focal hypodensity in the right parietal lobe likely reflecting remote infarct. Additional foci of hypodensity in the bilateral cerebral hemispheres are nonspecific but may reflect sequela of chronic white matter microangiopathy. There is no evidence of acute intracranial hemorrhage or extra-axial fluid collection. The ventricles are not enlarged. There is no mass lesion. There is no midline shift. Vascular: There is a dense MCA on the left. See below. Skull: Normal. Negative for fracture or focal lesion. Sinuses/orbits: The imaged paranasal sinuses are clear. The mastoid air cells are clear. Bilateral lens implants are in place. The globes and orbits are otherwise unremarkable. Review of the MIP images  confirms the above findings CTA NECK FINDINGS The CTA images are moderately degraded by motion artifact. Aortic arch: Standard branching. Imaged portion shows no evidence of aneurysm or dissection. No significant stenosis of the major arch vessel origins. Right carotid system: The right carotid system is suboptimally evaluated particularly in the region of the carotid bulb due to motion artifact. The right internal carotid artery has a medialized course. Otherwise, there is no definite focal stenosis, occlusion, dissection, or aneurysm. Left carotid system: The left carotid system is suboptimally evaluated due to motion artifact particularly in the region of the carotid bulb. There is calcified atherosclerotic plaque of the left carotid bulb without definite hemodynamically significant stenosis or occlusion. There is no definite dissection or aneurysm. Vertebral arteries: The vertebral arteries appear patent, without definite focal stenosis, dissection, occlusion, or aneurysm. Skeleton: There is mild multilevel degenerative change of the cervical spine. There is no acute osseous abnormality or aggressive osseous lesion. Other neck: The soft tissues are unremarkable. Upper chest: There is a 1.1 cm x 1.0 cm soft tissue nodule in the medial right apex. Review of the MIP images confirms the above findings CTA HEAD FINDINGS Anterior circulation: There is calcification of the bilateral cavernous ICAs without focal stenosis or occlusion. There is occlusion of the proximal left M1 segment with reconstitution of flow at the M2 segments. There appears to be good collateral flow throughout the peripheral MCA distribution. The right MCA is patent. The bilateral ACAs are patent. The right A1 segment is diminutive. There is no aneurysm. Posterior circulation: The posterior circulation is suboptimally evaluated due to motion artifact. The V4 segments of the vertebral arteries are patent. The basilar artery is patent. There is  multifocal moderate stenosis of the left P2 segment (9-256, 10-116, 10-123). The right PCA is patent.  There is no aneurysm. Venous sinuses: As permitted by contrast timing, patent. Anatomic variants: None. Review of the MIP images confirms the above findings IMPRESSION: 1. Occlusion of the left M1 segment with reconstitution of flow at the M2 segments with good collateral flow throughout the remainder of the MCA distribution. 2. No evidence of left MCA territory infarct at the time of imaging. Aspects is 10. 3. The CTA images are moderately motion degraded. There is no definite high-grade stenosis in the neck. 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 4. 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 Other foci of hypodensity in the supratentorial white  matter likely reflects sequela of chronic white matter microangiopathy. 5. 1.1 cm soft tissue nodule in the medial right apex. Consider one of the following in 3 months for both low-risk and high-risk individuals: (a) repeat chest CT, (b) follow-up PET-CT, or (c) tissue sampling. This recommendation follows the consensus statement: Guidelines for Management of Incidental Pulmonary Nodules Detected on CT Images: From the Fleischner Society 2017; Radiology 2017; 284:228-243. The acute results were called by telephone at the time of interpretation on 05/25/2021 at 3:05 pm to provider Dr Cheral Marker , who verbally acknowledged these results. Electronically Signed   By: Valetta Mole M.D.   On: 05/25/2021 15:22     PHYSICAL EXAM  Temp:  [97.6 F (36.4 C)-98.2 F (36.8 C)] 98.2 F (36.8 C) (09/22 1148) Pulse Rate:  [65-112] 96 (09/22 1000) Resp:  [15-27] 22 (09/22 1000) BP: (98-164)/(45-107) 140/86 (09/22 1000) SpO2:  [90 %-100 %] 93 % (09/22 1000)  General - Well nourished, well developed, in no apparent distress.  Ophthalmologic - fundi not visualized due to noncooperation.  Cardiovascular - irregularly irregular heart rate and  rhythm.  Neuro - awake, alert, eyes open, orientated to place, age, months and people, but not to year.  Mild expressive aphasia occasional word finding difficulty and paraphasic errors, however, following all simple commands. Able to repeat and naming 2/4.  Mild dysarthria.  No gaze palsy, tracking bilaterally, blinking to visual threat bilaterally, PERRL.  Mild right facial droop. Tongue midline.  Left upper extremity 4/5 and right upper extremity no drift 4-/5.  Bilateral lower extremity proximal 3+/5 proximal and 4/5 distal.  Sensation symmetrical bilaterally, b/l FTN intact although slow on the right, gait not tested.    ASSESSMENT/PLAN Deborah Carey is a 70 y.o. female with history of anxiety and HLD admitted for right-sided weakness, left gaze, aphasia and altered mental status. TNK given.    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 fib CT head no acute abnormality, old right parietal small infarct CT head and neck left M1 occlusion left M2 reconstitution.  Left P2 stenosis MRI left MCA infarcts more prominent at left BG and caudate head, right MCA and ACA punctate infarcts MRA left MCA patent now 2D Echo EF 50 to 55%, left atrial size normal LDL 109 HgbA1c 5.8 SCDs for VTE prophylaxis No antithrombotic prior to admission, now on ASA 340m. Will consider DOAC in 3-5 days post stroke Ongoing aggressive stroke risk factor management Therapy recommendations: CIR Disposition: Pending  A. Fib New diagnosis EKG confirmed Cardiology consulted, appreciate assistance Rate controlled  Hypotension, improved BP stable S/p bolus this am and one dose of albumin yesterday BP < 180/105 Long term BP goal normotensive  Hyperlipidemia Home meds: None LDL 109, goal < 70 On Lipitor 40 Continue statin at discharge  Dysphagia Did not pass bedside swallow N.p.o.  with sip for meds Pending MBS Speech on board  Other Stroke Risk Factors Advanced age Obesity, Body mass index is 36.33 kg/m.  Hx stroke/TIA on imaging  Other Active Problems CT head and neck showed right lung apex 1cm soft tissue nodule - need outpatient follow-up  Hospital day # 2  This patient is critically ill due to left MCA infarct status post TNKase and thrombectomy due to left M1 occlusion, new onset A. fib, soft blood pressure and dysphagia and at significant risk of neurological worsening,  death form recurrent stroke, hemorrhagic conversion, heart failure, aspiration pneumonia. This patient's care requires constant monitoring of vital signs, hemodynamics, respiratory and cardiac monitoring, review of multiple databases, neurological assessment, discussion with family, other specialists and medical decision making of high complexity. I spent 40 minutes of neurocritical care time in the care of this patient. I had long discussion with husband and daughter at bedside, updated pt current condition, treatment plan and potential prognosis, and answered all the questions.  They expressed understanding and appreciation.     Rosalin Hawking, MD PhD Stroke Neurology 05/27/2021 11:56 AM    To contact Stroke Continuity provider, please refer to [email or web address] After hours, contact General Neurology

## 2021-05-27 NOTE — Progress Notes (Signed)
Inpatient Rehab Admissions Coordinator:   Attempted to meet with patient at bedside.  Leaving the unit for MBSS.  No family at bedside. I will f/u with her tomorrow.   Shann Medal, PT, DPT Admissions Coordinator [phone] 05/27/21  3:48 PM

## 2021-05-28 DIAGNOSIS — Z9282 Status post administration of tPA (rtPA) in a different facility within the last 24 hours prior to admission to current facility: Secondary | ICD-10-CM | POA: Diagnosis not present

## 2021-05-28 DIAGNOSIS — I63412 Cerebral infarction due to embolism of left middle cerebral artery: Secondary | ICD-10-CM

## 2021-05-28 DIAGNOSIS — I480 Paroxysmal atrial fibrillation: Secondary | ICD-10-CM | POA: Diagnosis not present

## 2021-05-28 DIAGNOSIS — I4819 Other persistent atrial fibrillation: Secondary | ICD-10-CM | POA: Diagnosis not present

## 2021-05-28 LAB — BASIC METABOLIC PANEL
Anion gap: 8 (ref 5–15)
BUN: 9 mg/dL (ref 8–23)
CO2: 27 mmol/L (ref 22–32)
Calcium: 8.6 mg/dL — ABNORMAL LOW (ref 8.9–10.3)
Chloride: 103 mmol/L (ref 98–111)
Creatinine, Ser: 0.66 mg/dL (ref 0.44–1.00)
GFR, Estimated: >60 mL/min (ref >60)
Glucose, Bld: 91 mg/dL (ref 70–99)
Potassium: 3.4 mmol/L — ABNORMAL LOW (ref 3.5–5.1)
Sodium: 138 mmol/L (ref 135–145)

## 2021-05-28 LAB — GLUCOSE, CAPILLARY
Glucose-Capillary: 152 mg/dL — ABNORMAL HIGH (ref 70–99)
Glucose-Capillary: 73 mg/dL (ref 70–99)
Glucose-Capillary: 81 mg/dL (ref 70–99)
Glucose-Capillary: 83 mg/dL (ref 70–99)
Glucose-Capillary: 192 mg/dL — ABNORMAL HIGH (ref 70–99)
Glucose-Capillary: 115 mg/dL — ABNORMAL HIGH (ref 70–99)

## 2021-05-28 LAB — CBC
HCT: 33.7 % — ABNORMAL LOW (ref 36.0–46.0)
Hemoglobin: 11.5 g/dL — ABNORMAL LOW (ref 12.0–15.0)
MCH: 31.6 pg (ref 26.0–34.0)
MCHC: 34.1 g/dL (ref 30.0–36.0)
MCV: 92.6 fL (ref 80.0–100.0)
Platelets: 177 10*3/uL (ref 150–400)
RBC: 3.64 MIL/uL — ABNORMAL LOW (ref 3.87–5.11)
RDW: 11.9 % (ref 11.5–15.5)
WBC: 9.5 10*3/uL (ref 4.0–10.5)
nRBC: 0 % (ref 0.0–0.2)

## 2021-05-28 MED ORDER — INFLUENZA VAC A&B SA ADJ QUAD 0.5 ML IM PRSY
0.5000 mL | PREFILLED_SYRINGE | INTRAMUSCULAR | Status: DC
  Filled 2021-05-28: qty 0.5

## 2021-05-28 NOTE — Progress Notes (Signed)
Physical Therapy Treatment Patient Details Name: Deborah Carey MRN: [id number] DOB: April 22, 1951 Today's Date: 05/28/2021   History of Present Illness 70 y.o. female presents to [facility] hospital on 05/25/2021 with R sided weakness, L eye deviation and aphasia. Pt received TKN and was intubated due to worsening mental status. CTA demonstrates L M1/MCA occlusion, pt taken to IR for mechanical thrombectomy with complete revascularization. PMH includes osteoarthritis, anxiety, and cataracts.    PT Comments    Pt was able to walk a short distance across her room with RW, right leg showed signs of functional weakness 4-/5 when MMT in sitting related to knee instability in stance and right foot drag. PT will continue to follow acutely for safe mobility progression.   Recommendations for follow up therapy are one component of a multi-disciplinary discharge planning process, led by the attending physician.  Recommendations may be updated based on patient status, additional functional criteria and insurance authorization.  Follow Up Recommendations  CIR     Equipment Recommendations  Rolling walker with 5" wheels    Recommendations for Other Services       Precautions / Restrictions Precautions Precautions: Fall Precaution Comments: right sided weakness     Mobility  Bed Mobility               General bed mobility comments: Pt was OOB in the recliner chair.    Transfers Overall transfer level: Needs assistance Equipment used: 1 person hand held assist;Rolling walker (2 wheeled) Transfers: Sit to/from [facility] Sit to Stand: Min assist Stand pivot transfers: Min assist       General transfer comment: Min assist to stand from recliner chair and from transport chair.  Heavier min assist without assistive device the first time.  Min assist to stand and pivot to the transport chair.  There was some functional R leg weakness, but not as profound as  yesterday.  Ambulation/Gait Ambulation/Gait assistance: Min assist Gait Distance (Feet): 10 Feet Assistive device: Rolling walker (2 wheeled) Gait Pattern/deviations: Step-through pattern;Decreased step length - right;Decreased dorsiflexion - right     General Gait Details: Pt able to walk a short distance in her new room from transport chair to the recliner chair with RW, some instability noted in stance at right knee and some mild foot drag which likely would increase with fatigue, but significantly improved over last session.   Stairs             Wheelchair Mobility    Modified Rankin (Stroke Patients Only) Modified Rankin (Stroke Patients Only) Pre-Morbid Rankin Score: No symptoms Modified Rankin: Moderately severe disability     Balance Overall balance assessment: Needs assistance Sitting-balance support: Feet supported;No upper extremity supported Sitting balance-Leahy Scale: Good     Standing balance support: Bilateral upper extremity supported Standing balance-Leahy Scale: Poor Standing balance comment: needs at least one hand support for standing.                            Cognition Arousal/Alertness: Awake/alert Behavior During Therapy: Flat affect Overall Cognitive Status: Impaired/Different from baseline Area of Impairment: Attention;Safety/judgement;Awareness;Problem solving                   Current Attention Level: Sustained Memory: Decreased short-term memory;Decreased recall of precautions Following Commands: Follows one step commands consistently Safety/Judgement: Decreased awareness of safety;Decreased awareness of deficits Awareness: Emergent Problem Solving: Slow processing;Difficulty sequencing;Requires verbal cues;Requires tactile cues General Comments: Pt's cognition improving,  affect is flat, able to tell me her right side is weak and followed all of my commands with just mild processing delay.      Exercises       General Comments        Pertinent Vitals/Pain Pain Assessment: Faces Faces Pain Scale: No hurt    Home Living                      Prior Function            PT Goals (current goals can now be found in the care plan section) Progress towards PT goals: Progressing toward goals    Frequency    Min 4X/week      PT Plan Current plan remains appropriate    Co-evaluation              AM-PAC PT "6 Clicks" Mobility   Outcome Measure  Help needed turning from your back to your side while in a flat bed without using bedrails?: A Little Help needed moving from lying on your back to sitting on the side of a flat bed without using bedrails?: A Little Help needed moving to and from a bed to a chair (including a wheelchair)?: A Little Help needed standing up from a chair using your arms (e.g., wheelchair or bedside chair)?: A Little Help needed to walk in hospital room?: A Little Help needed climbing 3-5 steps with a railing? 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

## 2021-05-28 NOTE — Progress Notes (Signed)
Patient transferred to room 21 on 3W by wheelchair on tele with this RN and PT at side. Patient alert with no distress noted sitting up in bedside recliner chair eating dinner. Ireti, RN at bedside who is taking over patient's care. Patient's husband and daughter at bedside as well.

## 2021-05-28 NOTE — Progress Notes (Signed)
Inpatient Rehab Admissions Coordinator:   Met with patient at bedside to discuss CIR recommendations.  She answers yes/no questions appropriately and apparent accuracy. She is agreeable to CIR and I will start insurance auth for her today.  I was able to speak to her spouse, who can provide 24/7 support at discharge, and is hopeful for CIR as well.  Will continue to follow.   Shann Medal, PT, DPT Admissions Coordinator [phone] 05/28/21  3:14 PM

## 2021-05-28 NOTE — Evaluation (Signed)
Speech Language Pathology Evaluation Patient Details Name: Deborah Carey MRN: [id number] DOB: 07-Jul-1951 Today's Date: 05/28/2021 Time: 5462-7035 SLP Time Calculation (min) (ACUTE ONLY): 33 min  Problem List:  Patient Active Problem List   Diagnosis Date Noted   Cerebrovascular accident (CVA) (Deborah Carey) 05/25/2021   Acute respiratory insufficiency    Atrial fibrillation (Deborah Carey)    Mass of breast, left 11/13/2020   Colon cancer screening 05/14/2019   Vitamin D deficiency 05/08/2018   Flushing 06/20/2017   H/O fracture of fibula 01/21/2014   Osteopenia 01/07/2014   Family history of osteoporosis 11/29/2013   Estrogen deficiency 11/29/2013   Hyperlipidemia 11/29/2013   Encounter for Medicare annual wellness exam 10/29/2013   Irregular heartbeat 10/07/2013   Left thyroid nodule 09/26/2013   Enlarged thyroid 09/16/2013   Primary osteoarthritis of both knees 11/05/2012   Other screening mammogram 09/27/2011   Routine general medical examination at a health care facility 09/19/2011   Obesity (BMI 35.0-39.9 without comorbidity) 06/06/2008   Prediabetes 06/06/2008   Anxiety state 04/17/2007   ALLERGIC RHINITIS 04/17/2007   Past Medical History:  Past Medical History:  Diagnosis Date   Allergy    allergic rhinitis   Anxiety    Cataract    removed both eyes    Infertility, female    Osteoarthritis of both knees    OA    PONV (postoperative nausea and vomiting)    Post-menopausal bleeding    neg endo bx.   Past Surgical History:  Past Surgical History:  Procedure Laterality Date   cataract surgery  2011   CESAREAN SECTION     times 2   COLONOSCOPY  2010   hems    ENDOMETRIAL BIOPSY     neg for CA cells   FACIAL COSMETIC SURGERY  2006   face lift   FOOT SURGERY  1998   rt heel spur removal   IR CT HEAD LTD  05/25/2021   IR PERCUTANEOUS ART THROMBECTOMY/INFUSION INTRACRANIAL INC DIAG ANGIO  05/25/2021   IR US GUIDE VASC ACCESS RIGHT  05/25/2021   RADIOLOGY WITH ANESTHESIA N/A  05/25/2021   Procedure: IR WITH ANESTHESIA;  Surgeon: Aletta Edouard, MD;  Location: [facility];  Service: Radiology;  Laterality: N/A;   REFRACTIVE SURGERY  08/1999   HPI:  70 y.o. F with PMH of cataracts, anxiety, osteoarthritis who was at the cancer center with her husband when she started having right-sided weakness with left eye deviation and aphasia. CTA with left M1/MCA occlusion and she was taken emergently to IR for mechanical thrombectomy which resulted in complete recanalization. 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 DYs 3, NTL recommended.   Assessment / Plan / Recommendation Clinical Impression  Pt presents with primarily expressive aphasia with question of come receptive difficulties post CVA. Cognitive status difficult to assess at this time due to language deficits. She spontaneously greeted SLP upon arrival and provided name without difficulty. Verbal expression during simple conversation and picture description task was halting and frequented with word finding difficulty. Semantic paraphasias and perseveration noted during naming tasks, especially higher level responsive naming task. Auditory comprehension appears [facility] for simple commands, y/n questions and simple conversation, however question abilities with more complex tasks. Pt ordered meals with dietary staff member during evaluation with pt demonstrating ability to navigate and read preferred menu items, however she benefited from verbal choices to ultimately complete her orders. SLP to continue f/u to address expressive aphasia and continue assessment  of higher level receptive tasks.    SLP Assessment  SLP Recommendation/Assessment: Patient needs continued Speech [facility] SLP Visit Diagnosis: Aphasia (R47.01)    Recommendations for follow up therapy are one component of a multi-disciplinary discharge planning process, led by the attending physician.   Recommendations may be updated based on patient status, additional functional criteria and insurance authorization.    Follow Up Recommendations  Inpatient Rehab    Frequency and Duration min 2x/week  2 weeks      SLP Evaluation Cognition  Overall Cognitive Status: Impaired/Different from baseline Arousal/Alertness: Awake/alert Orientation Level: Oriented to person;Oriented to place;Oriented to time;Disoriented to situation Year: 2022 Month: September Day of Week: Correct Awareness: Impaired Awareness Impairment: Intellectual impairment       Comprehension  Auditory Comprehension Overall Auditory Comprehension: Impaired Yes/No Questions: Impaired Complex Questions: 75-100% accurate Commands: Within Functional Limits Conversation: Simple EffectiveTechniques: Extra processing time;Repetition Visual Recognition/Discrimination Discrimination: Within Function Limits Reading Comprehension Reading Status: Within funtional limits    Expression Expression Primary Mode of Expression: Verbal Verbal Expression Overall Verbal Expression: Impaired Initiation: No impairment Automatic Speech: Name;Social Response;Counting;Day of week;Month of year Level of Generative/Spontaneous Verbalization: Sentence Naming: Impairment Responsive: 0-25% accurate Confrontation: Impaired Convergent: 75-100% accurate Verbal Errors: Perseveration;Semantic paraphasias;Not aware of errors Effective Techniques: Semantic cues;Phonemic cues;Sentence completion Written Expression Dominant Hand: Right Written Expression: Not tested   Oral / Motor  Oral Motor/Sensory Function Overall Oral Motor/Sensory Function: Within functional limits Motor Speech Overall Motor Speech: Appears within functional limits for tasks assessed   [place], [place], [place] Office Number: [phone]  [name] 05/28/2021, 10:49 AM

## 2021-05-28 NOTE — Progress Notes (Signed)
Progress Note  Patient Name: HANIYA FERN Date of Encounter: 05/28/2021  [facility] Cardiologist: None   Subjective   Passed swallowing eval needing thickened liquids.  Eating regular food as well this AM.   Inpatient Medications    Scheduled Meds:   stroke: mapping our early stages of recovery book   Does not apply Once   aspirin EC  325 mg Oral Daily   atorvastatin  40 mg Oral Daily   Chlorhexidine Gluconate Cloth  6 each Topical Daily   insulin aspart  0-15 Units Subcutaneous Q4H   pantoprazole  40 mg Oral Daily   Continuous Infusions:  sodium chloride     PRN Meds: acetaminophen **OR** acetaminophen (TYLENOL) oral liquid 160 mg/5 mL **OR** acetaminophen, ondansetron (ZOFRAN) IV, senna-docusate   Vital Signs    Vitals:   05/28/21 0600 05/28/21 0700 05/28/21 0759 05/28/21 0802  BP: 126/67 130/63  (!) 162/72  Pulse: 64 62  73  Resp: 16 20  (!) 22  Temp:   98 F (36.7 C)   TempSrc:   Oral   SpO2: 93% 98%  95%  Weight:      Height:        Intake/Output Summary (Last 24 hours) at 05/28/2021 1135 Last data filed at 05/28/2021 0430 Gross per 24 hour  Intake 240 ml  Output 775 ml  Net -535 ml   Last 3 Weights 05/25/2021 05/25/2021 05/29/2020  Weight (lbs) 211 lb 10.3 oz 214 lb 214 lb 6 oz  Weight (kg) 96 kg 97.07 kg 97.24 kg      Telemetry    AFib, rate controlled - Personally Reviewed  ECG      Physical Exam   GEN: No acute distress.   Neck: No JVD Cardiac: RRR, no murmurs, rubs, or gallops.  Respiratory: Clear to auscultation bilaterally. GI: Soft, nontender, non-distended  MS: No edema; No deformity. Neuro:  Nonfocal  Psych: Normal affect   Labs    High Sensitivity Troponin:   Recent Labs  Lab 05/25/21 1447  TROPONINIHS 7     Chemistry Recent Labs  Lab 05/25/21 1520 05/25/21 1907 05/25/21 2204 05/26/21 0429 05/27/21 0318 05/28/21 0316  NA 138  --    < > 137 139 138  K 4.0  --    < > 3.9 4.0 3.4*  CL 102  --   --  103 105 103   CO2 26  --   --  23 27 27   GLUCOSE 128*  --   --  146* 90 91  BUN 18  --   --  14 12 9   CREATININE 0.83  --   --  0.78 0.77 0.66  CALCIUM 9.4  --   --  8.9 8.7* 8.6*  MG  --  2.1  --   --   --   --   PROT 7.3  --   --  6.1*  --   --   ALBUMIN 4.2  --   --  3.4*  --   --   AST 20  --   --  18  --   --   ALT 16  --   --  15  --   --   ALKPHOS 68  --   --  47  --   --   BILITOT 0.8  --   --  0.6  --   --   GFRNONAA >60  --   --  >60 >60 >60  ANIONGAP 10  --   --  11 7 8    < > = values in this interval not displayed.    Lipids  Recent Labs  Lab 05/26/21 0429  CHOL 171  TRIG 64  66  HDL 49  LDLCALC 109*  CHOLHDL 3.5    Hematology Recent Labs  Lab 05/26/21 0429 05/27/21 0318 05/28/21 0316  WBC 11.0* 8.1 9.5  RBC 4.13 3.64* 3.64*  HGB 13.1 11.6* 11.5*  HCT 38.6 34.5* 33.7*  MCV 93.5 94.8 92.6  MCH 31.7 31.9 31.6  MCHC 33.9 33.6 34.1  RDW 12.4 12.3 11.9  PLT 206 177 177   Thyroid  Recent Labs  Lab 05/26/21 0429  TSH 4.101    BNPNo results for input(s): BNP, PROBNP in the last 168 hours.  DDimer No results for input(s): DDIMER in the last 168 hours.   Radiology    CT ABDOMEN PELVIS W WO CONTRAST  Result Date: 05/26/2021 CLINICAL DATA:  Abdominal pain. EXAM: CT ABDOMEN AND PELVIS WITHOUT AND WITH CONTRAST TECHNIQUE: Multidetector CT imaging of the abdomen and pelvis was performed following the standard protocol before and following the bolus administration of intravenous contrast. CONTRAST:  49mL OMNIPAQUE IOHEXOL 350 MG/ML SOLN COMPARISON:  None. FINDINGS: Lower chest: The lung bases are clear of an acute process. No pleural effusions or pulmonary lesions. The heart is within normal limits in size. No pericardial effusion. Hepatobiliary: No hepatic lesions or intrahepatic biliary dilatation. High attenuation material in the gallbladder likely due to vicarious excretion of contrast material from prior CT scan and interventional procedure. There is a 2.4 cm gallstone  noted the gallbladder but no findings suspicious for acute cholecystitis. No common bile duct dilatation. Pancreas: No mass, inflammation or ductal dilatation. Spleen: Normal size.  No focal lesions. Adrenals/Urinary Tract: Adrenal glands and kidneys are unremarkable. There is some contrast in both collecting systems. The bladder also contains contrast material. No bladder mass or asymmetric bladder wall thickening. Stomach/Bowel: The stomach, duodenum, small bowel and colon are unremarkable. No acute inflammatory changes, mass lesions or obstructive findings. The terminal ileum is normal. The appendix is normal. Scattered colonic diverticulosis but no findings for acute diverticulitis. Vascular/Lymphatic: Scattered atherosclerotic calcifications but no aneurysm or dissection. The major venous structures are patent. No mesenteric or retroperitoneal mass or adenopathy. Reproductive: The uterus and ovaries are unremarkable. Other: No pelvic mass or adenopathy. No free pelvic fluid collections. No inguinal mass or adenopathy. Small periumbilical abdominal wall hernia containing fat. Musculoskeletal: No significant bony findings. IMPRESSION: 1. No acute abdominal/pelvic findings, mass lesions or adenopathy. 2. Cholelithiasis without findings suspicious for acute cholecystitis. 3. Scattered colonic diverticulosis but no findings for acute diverticulitis. Electronically Signed   By: Marijo Sanes M.D.   On: 05/26/2021 20:30   MR ANGIO HEAD WO CONTRAST  Result Date: 05/26/2021 CLINICAL DATA:  Follow-up stroke. Left-sided weakness. Left MCA occlusion with intervention. EXAM: MRA HEAD WITHOUT CONTRAST TECHNIQUE: Angiographic images of the Circle of Willis were acquired using MRA technique without intravenous contrast. COMPARISON:  MRI earlier same day FINDINGS: Anterior circulation: Both internal carotid arteries are widely patent through the skull base and siphon regions. The anterior and middle cerebral vessels are  patent. Reconstituted flow in the left MCA without proximal stenosis or missing distal branch vessels. Posterior circulation: Both vertebral arteries widely patent to the basilar. No basilar stenosis. Stenoses in the PCA P2 segments, worse on the left than the right. Anatomic variants: None significant. Other: None. IMPRESSION: Good appearance of the  anterior circulation flow presently. Status post mechanical thrombectomy on the left. Wide patency of the left MCA branches presently. 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 Electronically Signed   By: Nelson Chimes M.D.   On: 05/26/2021 15:18   MR BRAIN WO CONTRAST  Result Date: 05/26/2021 CLINICAL DATA:  Follow-up stroke. EXAM: MRI HEAD WITHOUT CONTRAST TECHNIQUE: Multiplanar, multiecho pulse sequences of the brain and surrounding structures were obtained without intravenous contrast. COMPARISON:  MR angiography same day. CT studies and intervention done yesterday. FINDINGS: Brain: There is acute infarction affecting the corpus stratum on the left. Minimal punctate infarction in the left posterior frontal subcortical white matter. Swelling of the putamen and caudate, with minimal petechial blood products but without frank hemorrhage. Three punctate foci of acute infarction in the right medial frontal lobe, possibly small embolic infarctions in the right anterior cerebral artery territory. No midline shift. Elsewhere, there is old cortical and subcortical infarction in the right parietal lobe and moderate chronic small-vessel ischemic changes of the cerebral hemispheric white matter. No hydrocephalus. No extra-axial collection. Vascular: Major vessels at the base of the brain show flow. Skull and upper cervical spine: Negative Sinuses/Orbits: Clear/normal Other: None IMPRESSION: Acute infarction of the left putamen and caudate. Minimal petechial blood products but no frank hematoma. Mild swelling but no midline shift. Three punctate acute  infarctions within the medial right frontal lobe, probably micro embolic infarctions in the anterior cerebral artery territory. Old infarction in the right parietal cortical and subcortical brain and seen affecting the cerebral hemispheric deep white matter extensively. Electronically Signed   By: Nelson Chimes M.D.   On: 05/26/2021 15:51   DG Swallowing Func-Speech Pathology  Result Date: 05/27/2021 Table formatting from the original result was not included. Objective Swallowing Evaluation: Type of Study: MBS-Modified Barium Swallow Study  Patient Details Name: SABREEN KITCHEN MRN: [id number] Date of Birth: Apr 17, 1951 Today's Date: 05/27/2021 Time: SLP Start Time (ACUTE ONLY): 1305 -SLP Stop Time (ACUTE ONLY): 1315 SLP Time Calculation (min) (ACUTE ONLY): 10 min Past Medical History: Past Medical History: Diagnosis Date  Allergy   allergic rhinitis  Anxiety   Cataract   removed both eyes   Infertility, female   Osteoarthritis of both knees   OA   PONV (postoperative nausea and vomiting)   Post-menopausal bleeding   neg endo bx. Past Surgical History: Past Surgical History: Procedure Laterality Date  cataract surgery  2011  CESAREAN SECTION    times 2  COLONOSCOPY  2010  hems   ENDOMETRIAL BIOPSY    neg for CA cells  FACIAL COSMETIC SURGERY  2006  face lift  FOOT SURGERY  1998  rt heel spur removal  IR CT HEAD LTD  05/25/2021  IR PERCUTANEOUS ART THROMBECTOMY/INFUSION INTRACRANIAL INC DIAG ANGIO  05/25/2021  IR US GUIDE VASC ACCESS RIGHT  05/25/2021  RADIOLOGY WITH ANESTHESIA N/A 05/25/2021  Procedure: IR WITH ANESTHESIA;  Surgeon: Aletta Edouard, MD;  Location: [facility];  Service: Radiology;  Laterality: N/A;  REFRACTIVE SURGERY  08/1999 HPI: 70 y.o. F with PMH of cataracts, anxiety, osteoarthritis who was at the cancer center with her husband when she started having right-sided weakness with left eye deviation and aphasia. CTA with left M1/MCA occlusion and she was taken emergently to IR for mechanical thrombectomy which  resulted in complete recanalization. Intubated 9/20 for procedure and extubated the same day.  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 Oral deficits c/b delayed oral transit, piecemeal swallow with mechanical soft textures, and reduced barium tablet propulsion to pharynx during barium tablet and nectar thick liquids series (pt able to propel tabelt to pharynx after 3 attempts). Pharyngeal deficits marked by reduced timely swallow initiation with thin liquids/ decreased timely epiglottic inversion allowing for pre swallow spillage and laryngeal penetration and tracheal aspiration of thins via both cup and straw (PAS-7,8). Pt would trigger a delayed throat clear in some instances but overall sensation of aspiration events remained poor and aspirates did not clear trachea. Pt with isolated instance of laryngeal penetration of nectar thick liquids when with barium tablet series only (PAS-3) nectar thick in isolation yielded intact airway protection as well as remaining POs tested (PAS-1). Recommend dysphagia 3 (mechanical soft) and nectar thick liquids with meds in puree and full supervision with POs. SLP to follow up. SLP Visit Diagnosis Dysphagia, oropharyngeal phase (R13.12) Attention and concentration deficit following -- Frontal lobe and executive function deficit following -- Impact on safety and function Moderate aspiration risk;Mild aspiration risk   CHL IP TREATMENT RECOMMENDATION 05/27/2021 Treatment Recommendations Therapy as outlined in treatment plan below   Prognosis 05/27/2021 Prognosis for Safe Diet Advancement Good Barriers to Reach Goals Language deficits;Time post onset Barriers/Prognosis Comment -- CHL IP DIET RECOMMENDATION 05/27/2021 SLP Diet Recommendations Dysphagia 3 (Mech soft) solids;Nectar thick liquid Liquid  Administration via Cup;Straw Medication Administration Whole meds with puree Compensations Small sips/bites;Slow rate;Minimize environmental distractions Postural Changes Seated upright at 90 degrees;Remain semi-upright after after feeds/meals (Comment)   CHL IP OTHER RECOMMENDATIONS 05/27/2021 Recommended Consults -- Oral Care Recommendations Oral care BID Other Recommendations Order thickener from pharmacy   CHL IP FOLLOW UP RECOMMENDATIONS 05/27/2021 Follow up Recommendations Other (comment)   CHL IP FREQUENCY AND DURATION 05/27/2021 Speech Therapy Frequency (ACUTE ONLY) min 2x/week Treatment Duration 2 weeks      CHL IP ORAL PHASE 05/27/2021 Oral Phase Impaired Oral - Pudding Teaspoon -- Oral - Pudding Cup -- Oral - Honey Teaspoon -- Oral - Honey Cup Delayed oral transit Oral - Nectar Teaspoon -- Oral - Nectar Cup Delayed oral transit Oral - Nectar Straw -- Oral - Thin Teaspoon -- Oral - Thin Cup Delayed oral transit Oral - Thin Straw Delayed oral transit Oral - Puree Delayed oral transit Oral - Mech Soft Delayed oral transit;Piecemeal swallowing;Decreased bolus cohesion Oral - Regular -- Oral - Multi-Consistency -- Oral - Pill Delayed oral transit;Reduced posterior propulsion;Holding of bolus Oral Phase - Comment --  CHL IP PHARYNGEAL PHASE 05/27/2021 Pharyngeal Phase Impaired Pharyngeal- Pudding Teaspoon -- Pharyngeal -- Pharyngeal- Pudding Cup -- Pharyngeal -- Pharyngeal- Honey Teaspoon -- Pharyngeal -- Pharyngeal- Honey Cup Delayed swallow initiation-vallecula;Reduced tongue base retraction;Pharyngeal residue - valleculae Pharyngeal -- Pharyngeal- Nectar Teaspoon -- Pharyngeal -- Pharyngeal- Nectar Cup Delayed swallow initiation-vallecula;Pharyngeal residue - valleculae Pharyngeal -- Pharyngeal- Nectar Straw -- Pharyngeal -- Pharyngeal- Thin Teaspoon -- Pharyngeal -- Pharyngeal- Thin Cup Delayed swallow initiation-pyriform sinuses;Penetration/Aspiration before swallow;Reduced airway/laryngeal closure;Reduced  anterior laryngeal mobility;Reduced epiglottic inversion Pharyngeal Material enters airway, passes BELOW cords without attempt by patient to eject out (silent aspiration);Material enters airway, passes BELOW cords and not ejected out despite cough attempt by patient Pharyngeal- Thin Straw Delayed swallow initiation-pyriform sinuses;Reduced airway/laryngeal closure;Penetration/Aspiration before swallow Pharyngeal Material enters airway, passes BELOW cords without attempt by patient to eject out (silent aspiration);Material enters airway, passes BELOW cords and not ejected out despite cough attempt by patient  Pharyngeal- Puree Delayed swallow initiation-vallecula Pharyngeal -- Pharyngeal- Mechanical Soft Delayed swallow initiation-vallecula;Reduced tongue base retraction;Pharyngeal residue - valleculae Pharyngeal -- Pharyngeal- Regular -- Pharyngeal -- Pharyngeal- Multi-consistency -- Pharyngeal -- Pharyngeal- Pill Reduced airway/laryngeal closure;Penetration/Aspiration before swallow;Penetration/Aspiration during swallow;Pharyngeal residue - valleculae Pharyngeal Material enters airway, remains ABOVE vocal cords and not ejected out Pharyngeal Comment --  CHL IP CERVICAL ESOPHAGEAL PHASE 05/27/2021 Cervical Esophageal Phase WFL Pudding Teaspoon -- Pudding Cup -- Honey Teaspoon -- Honey Cup -- Nectar Teaspoon -- Nectar Cup -- Nectar Straw -- Thin Teaspoon -- Thin Cup -- Thin Straw -- Puree -- Mechanical Soft -- Regular -- Multi-consistency -- Pill -- Cervical Esophageal Comment -- Chelsea E Hartness MA, CCC-SLP 05/27/2021, 2:16 PM              ECHOCARDIOGRAM COMPLETE  Result Date: 05/26/2021    ECHOCARDIOGRAM REPORT   Patient Name:   BERDIE MALTER Date of Exam: 05/26/2021 Medical Rec #:  [id number]     Height:       64.0 in Accession #:    [id number]    Weight:       211.6 lb Date of Birth:  1951-02-25      BSA:          2.004 m Patient Age:    33 years      BP:           129/61 mmHg Patient Gender: F             HR:            68 bpm. Exam Location:  Inpatient Procedure: 2D Echo, Cardiac Doppler, Color Doppler, Intracardiac Opacification            Agent and 3D Echo Indications:    Stroke I63.9  History:        Patient has no prior history of Echocardiogram examinations.                 Risk Factors:Hypertension. Acute respiratory insufficiency                 secondary to acute encephalopathy in the setting of CVA. Acute                 left MCA stroke s/p TNK and mechanical thrombectomy with                 complete revascularization. New diagnosis of atrial                 fibrillation, patient has converted to sinus rhythm.  Sonographer:    Darlina Sicilian RDCS Referring Phys: [id number] [place]  1. Left ventricular ejection fraction, by estimation, is 50 to 55%. The left ventricle has low normal function. The left ventricle demonstrates regional wall motion abnormalities - mild septal hypokinesis. There is mild left ventricular hypertrophy of the septal segment. Left ventricular diastolic parameters are indeterminate.  2. Right ventricular systolic function is normal. The right ventricular size is normal. There is normal pulmonary artery systolic pressure. The estimated right ventricular systolic pressure is 81.1 mmHg.  3. The mitral valve is normal in structure. Trivial mitral valve regurgitation. No evidence of mitral stenosis.  4. Tricuspid valve regurgitation is mild to moderate.  5. The aortic valve is grossly normal. Aortic valve regurgitation is not visualized. No aortic stenosis is present.  6. The inferior vena cava is normal in size with <50% respiratory variability, suggesting right atrial pressure of 8 mmHg. FINDINGS  Left Ventricle: Left ventricular ejection fraction, by estimation, is 50 to 55%. The left ventricle has low normal function. The left ventricle demonstrates regional wall motion abnormalities. 3D left ventricular ejection fraction analysis performed but  not reported based on  interpreter judgement due to suboptimal quality. The left ventricular internal cavity size was normal in size. There is mild left ventricular hypertrophy of the septal segment. Left ventricular diastolic parameters are indeterminate. Right Ventricle: The right ventricular size is normal. Right vetricular wall thickness was not well visualized. Right ventricular systolic function is normal. There is normal pulmonary artery systolic pressure. The tricuspid regurgitant velocity is 2.31 m/s, and with an assumed right atrial pressure of 8 mmHg, the estimated right ventricular systolic pressure is 33.2 mmHg. Left Atrium: Left atrial size was normal in size. Right Atrium: Right atrial size was normal in size. Pericardium: There is no evidence of pericardial effusion. Mitral Valve: The mitral valve is normal in structure. Mild mitral annular calcification. Trivial mitral valve regurgitation. No evidence of mitral valve stenosis. Tricuspid Valve: The tricuspid valve is normal in structure. Tricuspid valve regurgitation is mild to moderate. No evidence of tricuspid stenosis. Aortic Valve: The aortic valve is grossly normal. Aortic valve regurgitation is not visualized. No aortic stenosis is present. Pulmonic Valve: The pulmonic valve was not well visualized. Pulmonic valve regurgitation is trivial. No evidence of pulmonic stenosis. Aorta: The aortic root is normal in size and structure. Venous: The inferior vena cava is normal in size with less than 50% respiratory variability, suggesting right atrial pressure of 8 mmHg. IAS/Shunts: No atrial level shunt detected by color flow Doppler.  LEFT VENTRICLE PLAX 2D LVIDd:         4.70 cm     Diastology LVIDs:         3.50 cm     LV e' medial:    10.10 cm/s LV PW:         0.80 cm     LV E/e' medial:  7.6 LV IVS:        1.10 cm     LV e' lateral:   9.87 cm/s LVOT diam:     2.00 cm     LV E/e' lateral: 7.7 LV SV:         44 LV SV Index:   22 LVOT Area:     3.14 cm  LV Volumes (MOD)  LV vol d, MOD A2C: 76.2 ml LV vol d, MOD A4C: 57.2 ml LV vol s, MOD A2C: 20.0 ml LV vol s, MOD A4C: 12.7 ml LV SV MOD A2C:     56.2 ml LV SV MOD A4C:     57.2 ml LV SV MOD BP:      53.3 ml RIGHT VENTRICLE RV S prime:     12.30 cm/s TAPSE (M-mode): 1.7 cm LEFT ATRIUM             Index       RIGHT ATRIUM           Index LA diam:        4.00 cm 2.00 cm/m  RA Area:     12.10 cm LA Vol (A2C):   47.0 ml 23.45 ml/m RA Volume:   23.00 ml  11.48 ml/m LA Vol (A4C):   64.2 ml 32.04 ml/m LA Biplane Vol: 54.5 ml 27.20 ml/m  AORTIC VALVE LVOT Vmax:   71.68 cm/s LVOT Vmean:  45.300 cm/s LVOT VTI:    0.138 m  AORTA Ao Root  diam: 3.00 cm Ao Asc diam:  2.80 cm MITRAL VALVE               TRICUSPID VALVE MV Area (PHT): 3.34 cm    TR Peak grad:   21.3 mmHg MV Decel Time: 227 msec    TR Vmax:        231.00 cm/s MV E velocity: 76.47 cm/s                            SHUNTS                            Systemic VTI:  0.14 m                            Systemic Diam: 2.00 cm Cherlynn Kaiser MD Electronically signed by Cherlynn Kaiser MD Signature Date/Time: 05/26/2021/5:49:00 PM    Final     Cardiac Studies   Echo with preserved LVEF  Patient Profile     70 y.o. female with AFib, CVA  Assessment & Plan    AFib: rate controlled on no rate control meds.  BP increased, likely intentionally due to CVA.  Start anticoagulation when safe from neuro standpoint.  DOAC would be reasonable or IV heparin if other procedures were planned.    Please call with questions over the weekend.      For questions or updates, please contact [facility] Please consult [email or web address] for contact info under        Signed, Larae Grooms, MD  05/28/2021, 11:35 AM

## 2021-05-28 NOTE — Progress Notes (Signed)
STROKE TEAM PROGRESS NOTE   SUBJECTIVE (INTERVAL HISTORY) Her husband and speech therapy are at the bedside.  Patient sitting in chair, working with speech therapist.  Continue to improve speech and right facial droop.  No acute event overnight.  OBJECTIVE Temp:  [97.7 F (36.5 C)-98.2 F (36.8 C)] 98 F (36.7 C) (09/23 0759) Pulse Rate:  [62-102] 73 (09/23 0802) Cardiac Rhythm: Atrial fibrillation (09/23 0802) Resp:  [11-27] 22 (09/23 0802) BP: (101-168)/(54-133) 162/72 (09/23 0802) SpO2:  [90 %-98 %] 95 % (09/23 0802)  Recent Labs  Lab 05/27/21 1544 05/27/21 1937 05/27/21 2331 05/28/21 0336 05/28/21 0757  GLUCAP 125* 152* 73 81 83   Recent Labs  Lab 05/25/21 1520 05/25/21 1907 05/25/21 2204 05/26/21 0429 05/27/21 0318 05/28/21 0316  NA 138  --  138 137 139 138  K 4.0  --  4.1 3.9 4.0 3.4*  CL 102  --   --  103 105 103  CO2 26  --   --  _0 GLUCOSE 128*  --   --  146* 90 91  BUN 18  --   --  _1 CREATININE 0.83  --   --  0.78 0.77 0.66  CALCIUM 9.4  --   --  8.9 8.7* 8.6*  MG  --  2.1  --   --   --   --   PHOS  --  3.6  --   --   --   --    Recent Labs  Lab 05/25/21 1520 05/26/21 0429  AST 20 18  ALT 16 15  ALKPHOS 68 47  BILITOT 0.8 0.6  PROT 7.3 6.1*  ALBUMIN 4.2 3.4*   Recent Labs  Lab 05/25/21 1447 05/25/21 2204 05/26/21 0429 05/27/21 0318 05/28/21 0316  WBC 10.0  --  11.0* 8.1 9.5  NEUTROABS 5.9  --   --   --   --   HGB 14.8 12.9 13.1 11.6* 11.5*  HCT 40.3 38.0 38.6 34.5* 33.7*  MCV 92.2  --  93.5 94.8 92.6  PLT 267  --  206 177 177   No results for input(s): CKTOTAL, CKMB, CKMBINDEX, TROPONINI in the last 168 hours. Recent Labs    05/25/21 1447  LABPROT 12.6  INR 0.9   No results for input(s): COLORURINE, LABSPEC, PHURINE, GLUCOSEU, HGBUR, BILIRUBINUR, KETONESUR, PROTEINUR, UROBILINOGEN, NITRITE, LEUKOCYTESUR in the last 72 hours.  Invalid input(s): APPERANCEUR     Component Value Date/Time   CHOL 171 05/26/2021 0429    TRIG 64 05/26/2021 0429   TRIG 66 05/26/2021 0429   HDL 49 05/26/2021 0429   CHOLHDL 3.5 05/26/2021 0429   VLDL 13 05/26/2021 0429   LDLCALC 109 (H) 05/26/2021 0429   Lab Results  Component Value Date   HGBA1C 5.8 (H) 05/26/2021   No results found for: LABOPIA, COCAINSCRNUR, [place], [place], [place], [place]  Recent Labs  Lab 05/25/21 [place] <10    I have personally reviewed the radiological images below and agree with the radiology interpretations.  CT ABDOMEN PELVIS W WO CONTRAST  Result Date: 05/26/2021 CLINICAL DATA:  Abdominal pain. EXAM: CT ABDOMEN AND PELVIS WITHOUT AND WITH CONTRAST TECHNIQUE: Multidetector CT imaging of the abdomen and pelvis was performed following the standard protocol before and following the bolus administration of intravenous contrast. CONTRAST:  12m OMNIPAQUE IOHEXOL 350 MG/ML SOLN COMPARISON:  None. FINDINGS: Lower chest: The lung bases are clear of an acute process. No pleural effusions or pulmonary  lesions. The heart is within normal limits in size. No pericardial effusion. Hepatobiliary: No hepatic lesions or intrahepatic biliary dilatation. High attenuation material in the gallbladder likely due to vicarious excretion of contrast material from prior CT scan and interventional procedure. There is a 2.4 cm gallstone noted the gallbladder but no findings suspicious for acute cholecystitis. No common bile duct dilatation. Pancreas: No mass, inflammation or ductal dilatation. Spleen: Normal size.  No focal lesions. Adrenals/Urinary Tract: Adrenal glands and kidneys are unremarkable. There is some contrast in both collecting systems. The bladder also contains contrast material. No bladder mass or asymmetric bladder wall thickening. Stomach/Bowel: The stomach, duodenum, small bowel and colon are unremarkable. No acute inflammatory changes, mass lesions or obstructive findings. The terminal ileum is normal. The appendix is normal. Scattered colonic  diverticulosis but no findings for acute diverticulitis. Vascular/Lymphatic: Scattered atherosclerotic calcifications but no aneurysm or dissection. The major venous structures are patent. No mesenteric or retroperitoneal mass or adenopathy. Reproductive: The uterus and ovaries are unremarkable. Other: No pelvic mass or adenopathy. No free pelvic fluid collections. No inguinal mass or adenopathy. Small periumbilical abdominal wall hernia containing fat. Musculoskeletal: No significant bony findings. IMPRESSION: 1. No acute abdominal/pelvic findings, mass lesions or adenopathy. 2. Cholelithiasis without findings suspicious for acute cholecystitis. 3. Scattered colonic diverticulosis but no findings for acute diverticulitis. Electronically Signed   By: Marijo Sanes M.D.   On: 05/26/2021 20:30   MR ANGIO HEAD WO CONTRAST  Result Date: 05/26/2021 CLINICAL DATA:  Follow-up stroke. Left-sided weakness. Left MCA occlusion with intervention. EXAM: MRA HEAD WITHOUT CONTRAST TECHNIQUE: Angiographic images of the Circle of Willis were acquired using MRA technique without intravenous contrast. COMPARISON:  MRI earlier same day FINDINGS: Anterior circulation: Both internal carotid arteries are widely patent through the skull base and siphon regions. The anterior and middle cerebral vessels are patent. Reconstituted flow in the left MCA without proximal stenosis or missing distal branch vessels. Posterior circulation: Both vertebral arteries widely patent to the basilar. No basilar stenosis. Stenoses in the PCA P2 segments, worse on the left than the right. Anatomic variants: None significant. Other: None. IMPRESSION: Good appearance of the anterior circulation flow presently. Status post mechanical thrombectomy on the left. Wide patency of the left MCA branches presently. 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 Electronically Signed   By: Nelson Chimes M.D.   On: 05/26/2021 15:18   MR BRAIN WO  CONTRAST  Result Date: 05/26/2021 CLINICAL DATA:  Follow-up stroke. EXAM: MRI HEAD WITHOUT CONTRAST TECHNIQUE: Multiplanar, multiecho pulse sequences of the brain and surrounding structures were obtained without intravenous contrast. COMPARISON:  MR angiography same day. CT studies and intervention done yesterday. FINDINGS: Brain: There is acute infarction affecting the corpus stratum on the left. Minimal punctate infarction in the left posterior frontal subcortical white matter. Swelling of the putamen and caudate, with minimal petechial blood products but without frank hemorrhage. Three punctate foci of acute infarction in the right medial frontal lobe, possibly small embolic infarctions in the right anterior cerebral artery territory. No midline shift. Elsewhere, there is old cortical and subcortical infarction in the right parietal lobe and moderate chronic small-vessel ischemic changes of the cerebral hemispheric white matter. No hydrocephalus. No extra-axial collection. Vascular: Major vessels at the base of the brain show flow. Skull and upper cervical spine: Negative Sinuses/Orbits: Clear/normal Other: None IMPRESSION: Acute infarction of the left putamen and caudate. Minimal petechial blood products but no frank hematoma. Mild swelling but no midline  shift. Three punctate acute infarctions within the medial right frontal lobe, probably micro embolic infarctions in the anterior cerebral artery territory. Old infarction in the right parietal cortical and subcortical brain and seen affecting the cerebral hemispheric deep white matter extensively. Electronically Signed   By: Nelson Chimes M.D.   On: 05/26/2021 15:51   IR CT Head Ltd  Result Date: 05/25/2021 INDICATION: BRISTAL STEFFY is an 70 year-old female with a past medical history significant for osteoarthritis and cataracts who presented emergently to the ED at [facility] due to right side weakness, with eyes drifting to left, and unable to talk with  decreased level of consciousness. NIHSS at presentation was 19; baseline modified Rankin scale 0. Head CT showed no evidence of a large acute infarct or hemorrhage (ASPECTS 10). CTA showed a proximal left M1/MCA occlusion with good collaterals. She receive IV TNK and was then transferred to our service for a diagnostic cerebral angiogram and mechanical thrombectomy. EXAM: ULTRASOUND GUIDED VASCULAR ACCESS DIAGNOSTIC CEREBRAL ANGIOGRAM MECHANICAL THROMBECTOMY FLAT PANEL HEAD CT COMPARISON:  CT/CTA 05/25/2021. MEDICATIONS: No antibiotic administered. ANESTHESIA/SEDATION: The procedure was performed under general anesthesia. CONTRAST:  25 mL of Omnipaque 240 mg/mL FLUOROSCOPY TIME:  Fluoroscopy Time: 4 minutes 36 seconds (420 mGy). COMPLICATIONS: None immediate. TECHNIQUE: Informed written consent was obtained from the patient's husband after a thorough discussion of the procedural risks, benefits and alternatives. All questions were addressed. Maximal Sterile Barrier Technique was utilized including caps, mask, sterile gowns, sterile gloves, sterile drape, hand hygiene and skin antiseptic. A timeout was performed prior to the initiation of the procedure. The right groin was prepped and draped in the usual sterile fashion. Using a micropuncture kit and the modified Seldinger technique, access was gained to the right common femoral artery and an 8 French sheath was placed. Real-time ultrasound guidance was utilized for vascular access including the acquisition of a permanent ultrasound image documenting patency of the accessed vessel. Under fluoroscopy, a Zoom 88 guide catheter was navigated over a 6 [place] Berenstein 2 catheter and a 0.035" Terumo Glidewire into the aortic arch. The catheter was placed into the left common carotid artery and then advanced into the left internal carotid artery. The Berenstein 2 catheter was removed. Frontal and lateral angiograms of the head were obtained. FINDINGS: 1. Normal caliber of  the right common femoral artery. 2. Occlusion of the left MCA at the proximal M1 segment. PROCEDURE: Under biplane roadmap, a zoom 71 aspiration catheter was navigated over an Aristotle 24 microguidewire into the cavernous segment of the left ICA. The aspiration catheter was then advanced to the level of occlusion and connected to an aspiration pump. Continuous aspiration was performed for 2 minutes. The guide catheter was connected to a VacLok syringe and advanced into the M1 segment while the aspiration catheter wasremoved under constant aspiration. The Zoom 88 guide catheter was continuously aspirated until clear blood return was obtained. Left ICA angiograms with frontal and lateral views showed complete left MCA vascular tree recanalization without evidence of thromboembolic complication. Flat panel CT of the head was obtained and post processed in a separate workstation with concurrent attending physician supervision. Selected images were sent to PACS. No evidence of hemorrhagic complication. A right common femoral artery angiogram was then obtained in right anterior oblique view via sheath side port. The puncture is at the level of the common femoral artery which has normal caliber and contour, adequate for closure device. The femoral sheath was exchanged over the wire for a Perclose ProStyle  which was utilized for access closure. Immediate hemostasis was achieved. IMPRESSION: Successful and uncomplicated mechanical thrombectomy performed with direct contact aspiration for treatment of a proximal left M1/MCA occlusion with complete recanalization (TICI3). PLAN: 1. Transfer to ICA for continued care. 2. SBP 120-140 mmHg for 24 hours post procedure. 3. Bed rest for 6 hours. Electronically Signed   By: Pedro Earls M.D.   On: 05/25/2021 17:58   IR US Guide Vasc Access Right  Result Date: 05/25/2021 INDICATION: ALYX MCGUIRK is an 70 year-old female with a past medical history significant for  osteoarthritis and cataracts who presented emergently to the ED at [facility] due to right side weakness, with eyes drifting to left, and unable to talk with decreased level of consciousness. NIHSS at presentation was 19; baseline modified Rankin scale 0. Head CT showed no evidence of a large acute infarct or hemorrhage (ASPECTS 10). CTA showed a proximal left M1/MCA occlusion with good collaterals. She receive IV TNK and was then transferred to our service for a diagnostic cerebral angiogram and mechanical thrombectomy. EXAM: ULTRASOUND GUIDED VASCULAR ACCESS DIAGNOSTIC CEREBRAL ANGIOGRAM MECHANICAL THROMBECTOMY FLAT PANEL HEAD CT COMPARISON:  CT/CTA 05/25/2021. MEDICATIONS: No antibiotic administered. ANESTHESIA/SEDATION: The procedure was performed under general anesthesia. CONTRAST:  25 mL of Omnipaque 240 mg/mL FLUOROSCOPY TIME:  Fluoroscopy Time: 4 minutes 36 seconds (420 mGy). COMPLICATIONS: None immediate. TECHNIQUE: Informed written consent was obtained from the patient's husband after a thorough discussion of the procedural risks, benefits and alternatives. All questions were addressed. Maximal Sterile Barrier Technique was utilized including caps, mask, sterile gowns, sterile gloves, sterile drape, hand hygiene and skin antiseptic. A timeout was performed prior to the initiation of the procedure. The right groin was prepped and draped in the usual sterile fashion. Using a micropuncture kit and the modified Seldinger technique, access was gained to the right common femoral artery and an 8 French sheath was placed. Real-time ultrasound guidance was utilized for vascular access including the acquisition of a permanent ultrasound image documenting patency of the accessed vessel. Under fluoroscopy, a Zoom 88 guide catheter was navigated over a 6 [place] Berenstein 2 catheter and a 0.035" Terumo Glidewire into the aortic arch. The catheter was placed into the left common carotid artery and then advanced into the left  internal carotid artery. The Berenstein 2 catheter was removed. Frontal and lateral angiograms of the head were obtained. FINDINGS: 1. Normal caliber of the right common femoral artery. 2. Occlusion of the left MCA at the proximal M1 segment. PROCEDURE: Under biplane roadmap, a zoom 71 aspiration catheter was navigated over an Aristotle 24 microguidewire into the cavernous segment of the left ICA. The aspiration catheter was then advanced to the level of occlusion and connected to an aspiration pump. Continuous aspiration was performed for 2 minutes. The guide catheter was connected to a VacLok syringe and advanced into the M1 segment while the aspiration catheter wasremoved under constant aspiration. The Zoom 88 guide catheter was continuously aspirated until clear blood return was obtained. Left ICA angiograms with frontal and lateral views showed complete left MCA vascular tree recanalization without evidence of thromboembolic complication. Flat panel CT of the head was obtained and post processed in a separate workstation with concurrent attending physician supervision. Selected images were sent to PACS. No evidence of hemorrhagic complication. A right common femoral artery angiogram was then obtained in right anterior oblique view via sheath side port. The puncture is at the level of the common femoral artery which has normal  caliber and contour, adequate for closure device. The femoral sheath was exchanged over the wire for a Perclose ProStyle which was utilized for access closure. Immediate hemostasis was achieved. IMPRESSION: Successful and uncomplicated mechanical thrombectomy performed with direct contact aspiration for treatment of a proximal left M1/MCA occlusion with complete recanalization (TICI3). PLAN: 1. Transfer to ICA for continued care. 2. SBP 120-140 mmHg for 24 hours post procedure. 3. Bed rest for 6 hours. Electronically Signed   By: Pedro Earls M.D.   On: 05/25/2021 17:58    DG CHEST PORT 1 VIEW  Result Date: 05/26/2021 CLINICAL DATA:  Status post intubation. EXAM: PORTABLE CHEST 1 VIEW COMPARISON:  05/25/2021 FINDINGS: The ETT tip is 1.2 cm above the carina. Normal heart size. Interval resolution of previous interstitial and airspace opacities. Remote appearing right posterior rib fractures. IMPRESSION: 1. Stable position of ET tube with tip above the carina. 2. Interval clearing of interstitial edema. Electronically Signed   By: Kerby Moors M.D.   On: 05/26/2021 05:59   DG Chest Portable 1 View  Result Date: 05/25/2021 CLINICAL DATA:  Shortness of breath EXAM: PORTABLE CHEST 1 VIEW COMPARISON:  None. FINDINGS: Endotracheal tube terminates proximally 1.5 cm above the carina. Heart size appears mildly enlarged. Diffuse bilateral interstitial prominence. No pleural effusion or pneumothorax. Age indeterminate fractures of the posterior right seventh and likely eighth ribs. IMPRESSION: 1. Endotracheal tube terminates 1.5 cm above the carina. 2. Diffuse bilateral interstitial prominence may reflect atelectasis, edema, versus atypical/viral infection. 3. Age indeterminate fractures of the posterior right seventh and likely eighth ribs. Electronically Signed   By: Davina Poke D.O.   On: 05/25/2021 16:03   DG Swallowing Func-Speech Pathology  Result Date: 05/27/2021 Table formatting from the original result was not included. Objective Swallowing Evaluation: Type of Study: MBS-Modified Barium Swallow Study  Patient Details Name: Deborah Carey MRN: [id number] Date of Birth: [birth year] Today's Date: 05/27/2021 Time: SLP Start Time (ACUTE ONLY): 1305 -SLP Stop Time (ACUTE ONLY): 1315 SLP Time Calculation (min) (ACUTE ONLY): 10 min Past Medical History: Past Medical History: Diagnosis Date  Allergy   allergic rhinitis  Anxiety   Cataract   removed both eyes   Infertility, female   Osteoarthritis of both knees   OA   PONV (postoperative nausea and vomiting)   Post-menopausal  bleeding   neg endo bx. Past Surgical History: Past Surgical History: Procedure Laterality Date  cataract surgery  2011  CESAREAN SECTION    times 2  COLONOSCOPY  2010  hems   ENDOMETRIAL BIOPSY    neg for CA cells  FACIAL COSMETIC SURGERY  2006  face lift  FOOT SURGERY  1998  rt heel spur removal  IR CT HEAD LTD  05/25/2021  IR PERCUTANEOUS ART THROMBECTOMY/INFUSION INTRACRANIAL INC DIAG ANGIO  05/25/2021  IR US GUIDE VASC ACCESS RIGHT  05/25/2021  RADIOLOGY WITH ANESTHESIA N/A 05/25/2021  Procedure: IR WITH ANESTHESIA;  Surgeon: Aletta Edouard, MD;  Location: [facility];  Service: Radiology;  Laterality: N/A;  REFRACTIVE SURGERY  08/1999 HPI: 70 y.o. F with PMH of cataracts, anxiety, osteoarthritis who was at the cancer center with her husband when she started having right-sided weakness with left eye deviation and aphasia. CTA with left M1/MCA occlusion and she was taken emergently to IR for mechanical thrombectomy which resulted in complete recanalization. Intubated 9/20 for procedure and extubated the same day.  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 Oral deficits c/b delayed oral transit, piecemeal swallow with mechanical soft textures, and reduced barium tablet propulsion to pharynx during barium tablet and nectar thick liquids series (pt able to propel tabelt to pharynx after 3 attempts). Pharyngeal deficits marked by reduced timely swallow initiation with thin liquids/ decreased timely epiglottic inversion allowing for pre swallow spillage and laryngeal penetration and tracheal aspiration of thins via both cup and straw (PAS-7,8). Pt would trigger a delayed throat clear in some instances but overall sensation of aspiration events remained poor and aspirates did not clear trachea. Pt with isolated instance of laryngeal penetration of  nectar thick liquids when with barium tablet series only (PAS-3) nectar thick in isolation yielded intact airway protection as well as remaining POs tested (PAS-1). Recommend dysphagia 3 (mechanical soft) and nectar thick liquids with meds in puree and full supervision with POs. SLP to follow up. SLP Visit Diagnosis Dysphagia, oropharyngeal phase (R13.12) Attention and concentration deficit following -- Frontal lobe and executive function deficit following -- Impact on safety and function Moderate aspiration risk;Mild aspiration risk   CHL IP TREATMENT RECOMMENDATION 05/27/2021 Treatment Recommendations Therapy as outlined in treatment plan below   Prognosis 05/27/2021 Prognosis for Safe Diet Advancement Good Barriers to Reach Goals Language deficits;Time post onset Barriers/Prognosis Comment -- CHL IP DIET RECOMMENDATION 05/27/2021 SLP Diet Recommendations Dysphagia 3 (Mech soft) solids;Nectar thick liquid Liquid Administration via Cup;Straw Medication Administration Whole meds with puree Compensations Small sips/bites;Slow rate;Minimize environmental distractions Postural Changes Seated upright at 90 degrees;Remain semi-upright after after feeds/meals (Comment)   CHL IP OTHER RECOMMENDATIONS 05/27/2021 Recommended Consults -- Oral Care Recommendations Oral care BID Other Recommendations Order thickener from pharmacy   CHL IP FOLLOW UP RECOMMENDATIONS 05/27/2021 Follow up Recommendations Other (comment)   CHL IP FREQUENCY AND DURATION 05/27/2021 Speech Therapy Frequency (ACUTE ONLY) min 2x/week Treatment Duration 2 weeks      CHL IP ORAL PHASE 05/27/2021 Oral Phase Impaired Oral - Pudding Teaspoon -- Oral - Pudding Cup -- Oral - Honey Teaspoon -- Oral - Honey Cup Delayed oral transit Oral - Nectar Teaspoon -- Oral - Nectar Cup Delayed oral transit Oral - Nectar Straw -- Oral - Thin Teaspoon -- Oral - Thin Cup Delayed oral transit Oral - Thin Straw Delayed oral transit Oral - Puree Delayed oral transit Oral - Mech Soft  Delayed oral transit;Piecemeal swallowing;Decreased bolus cohesion Oral - Regular -- Oral - Multi-Consistency -- Oral - Pill Delayed oral transit;Reduced posterior propulsion;Holding of bolus Oral Phase - Comment --  CHL IP PHARYNGEAL PHASE 05/27/2021 Pharyngeal Phase Impaired Pharyngeal- Pudding Teaspoon -- Pharyngeal -- Pharyngeal- Pudding Cup -- Pharyngeal -- Pharyngeal- Honey Teaspoon -- Pharyngeal -- Pharyngeal- Honey Cup Delayed swallow initiation-vallecula;Reduced tongue base retraction;Pharyngeal residue - valleculae Pharyngeal -- Pharyngeal- Nectar Teaspoon -- Pharyngeal -- Pharyngeal- Nectar Cup Delayed swallow initiation-vallecula;Pharyngeal residue - valleculae Pharyngeal -- Pharyngeal- Nectar Straw -- Pharyngeal -- Pharyngeal- Thin Teaspoon -- Pharyngeal -- Pharyngeal- Thin Cup Delayed swallow initiation-pyriform sinuses;Penetration/Aspiration before swallow;Reduced airway/laryngeal closure;Reduced anterior laryngeal mobility;Reduced epiglottic inversion Pharyngeal Material enters airway, passes BELOW cords without attempt by patient to eject out (silent aspiration);Material enters airway, passes BELOW cords and not ejected out despite cough attempt by patient Pharyngeal- Thin Straw Delayed swallow initiation-pyriform sinuses;Reduced airway/laryngeal closure;Penetration/Aspiration before swallow Pharyngeal Material enters airway, passes BELOW cords without attempt by patient to eject out (silent aspiration);Material enters airway, passes BELOW cords and not ejected out despite cough attempt by patient  Pharyngeal- Puree Delayed swallow initiation-vallecula Pharyngeal -- Pharyngeal- Mechanical Soft Delayed swallow initiation-vallecula;Reduced tongue base retraction;Pharyngeal residue - valleculae Pharyngeal -- Pharyngeal- Regular -- Pharyngeal -- Pharyngeal- Multi-consistency -- Pharyngeal -- Pharyngeal- Pill Reduced airway/laryngeal closure;Penetration/Aspiration before swallow;Penetration/Aspiration  during swallow;Pharyngeal residue - valleculae Pharyngeal Material enters airway, remains ABOVE vocal cords and not ejected out Pharyngeal Comment --  CHL IP CERVICAL ESOPHAGEAL PHASE 05/27/2021 Cervical Esophageal Phase WFL Pudding Teaspoon -- Pudding Cup -- Honey Teaspoon -- Honey Cup -- Nectar Teaspoon -- Nectar Cup -- Nectar Straw -- Thin Teaspoon -- Thin Cup -- Thin Straw -- Puree -- Mechanical Soft -- Regular -- Multi-consistency -- Pill -- Cervical Esophageal Comment -- Chelsea E Hartness MA, CCC-SLP 05/27/2021, 2:16 PM              ECHOCARDIOGRAM COMPLETE  Result Date: 05/26/2021    ECHOCARDIOGRAM REPORT   Patient Name:   NAKITA SANTERRE Date of Exam: 05/26/2021 Medical Rec #:  [id number]     Height:       64.0 in Accession #:    [id number]    Weight:       211.6 lb Date of Birth:  06-Feb-1951      BSA:          2.004 m Patient Age:    70 years      BP:           129/61 mmHg Patient Gender: F             HR:           68 bpm. Exam Location:  Inpatient Procedure: 2D Echo, Cardiac Doppler, Color Doppler, Intracardiac Opacification            Agent and 3D Echo Indications:    Stroke I63.9  History:        Patient has no prior history of Echocardiogram examinations.                 Risk Factors:Hypertension. Acute respiratory insufficiency                 secondary to acute encephalopathy in the setting of CVA. Acute                 left MCA stroke s/p TNK and mechanical thrombectomy with                 complete revascularization. New diagnosis of atrial                 fibrillation, patient has converted to sinus rhythm.  Sonographer:    Darlina Sicilian RDCS Referring Phys: [id number] [place]  1. Left ventricular ejection fraction, by estimation, is 50 to 55%. The left ventricle has low normal function. The left ventricle demonstrates regional wall motion abnormalities - mild septal hypokinesis. There is mild left ventricular hypertrophy of the septal segment. Left ventricular diastolic parameters  are indeterminate.  2. Right ventricular systolic function is normal. The right ventricular size is normal. There is normal pulmonary artery systolic pressure. The estimated right ventricular systolic pressure is 03.7 mmHg.  3. The mitral valve is normal in structure. Trivial mitral valve regurgitation. No evidence of mitral stenosis.  4. Tricuspid valve regurgitation is mild to moderate.  5. The aortic valve is grossly normal. Aortic valve regurgitation is not visualized. No aortic stenosis is present.  6. The inferior vena cava is normal in size with <50% respiratory variability, suggesting right atrial pressure of 8 mmHg. FINDINGS  Left Ventricle: Left ventricular ejection fraction, by estimation, is 50 to 55%. The left ventricle has low normal function. The left ventricle demonstrates regional wall motion abnormalities. 3D left ventricular ejection fraction analysis performed but  not reported based on interpreter judgement due to suboptimal quality. The left ventricular internal cavity size was normal in size. There is mild left ventricular hypertrophy of the septal segment. Left ventricular diastolic parameters are indeterminate. Right Ventricle: The right ventricular size is normal. Right vetricular wall thickness was not well visualized. Right ventricular systolic function is normal. There is normal pulmonary artery systolic pressure. The tricuspid regurgitant velocity is 2.31 m/s, and with an assumed right atrial pressure of 8 mmHg, the estimated right ventricular systolic pressure is 27.7 mmHg. Left Atrium: Left atrial size was normal in size. Right Atrium: Right atrial size was normal in size. Pericardium: There is no evidence of pericardial effusion. Mitral Valve: The mitral valve is normal in structure. Mild mitral annular calcification. Trivial mitral valve regurgitation. No evidence of mitral valve stenosis. Tricuspid Valve: The tricuspid valve is normal in structure. Tricuspid valve regurgitation is  mild to moderate. No evidence of tricuspid stenosis. Aortic Valve: The aortic valve is grossly normal. Aortic valve regurgitation is not visualized. No aortic stenosis is present. Pulmonic Valve: The pulmonic valve was not well visualized. Pulmonic valve regurgitation is trivial. No evidence of pulmonic stenosis. Aorta: The aortic root is normal in size and structure. Venous: The inferior vena cava is normal in size with less than 50% respiratory variability, suggesting right atrial pressure of 8 mmHg. IAS/Shunts: No atrial level shunt detected by color flow Doppler.  LEFT VENTRICLE PLAX 2D LVIDd:         4.70 cm     Diastology LVIDs:         3.50 cm     LV e' medial:    10.10 cm/s LV PW:         0.80 cm     LV E/e' medial:  7.6 LV IVS:        1.10 cm     LV e' lateral:   9.87 cm/s LVOT diam:     2.00 cm     LV E/e' lateral: 7.7 LV SV:         44 LV SV Index:   22 LVOT Area:     3.14 cm  LV Volumes (MOD) LV vol d, MOD A2C: 76.2 ml LV vol d, MOD A4C: 57.2 ml LV vol s, MOD A2C: 20.0 ml LV vol s, MOD A4C: 12.7 ml LV SV MOD A2C:     56.2 ml LV SV MOD A4C:     57.2 ml LV SV MOD BP:      53.3 ml RIGHT VENTRICLE RV S prime:     12.30 cm/s TAPSE (M-mode): 1.7 cm LEFT ATRIUM             Index       RIGHT ATRIUM           Index LA diam:        4.00 cm 2.00 cm/m  RA Area:     12.10 cm LA Vol (A2C):   47.0 ml 23.45 ml/m RA Volume:   23.00 ml  11.48 ml/m LA Vol (A4C):   64.2 ml 32.04 ml/m LA Biplane Vol: 54.5 ml 27.20 ml/m  AORTIC VALVE LVOT Vmax:   71.68 cm/s LVOT Vmean:  45.300 cm/s LVOT VTI:    0.138 m  AORTA Ao Root diam:  3.00 cm Ao Asc diam:  2.80 cm MITRAL VALVE               TRICUSPID VALVE MV Area (PHT): 3.34 cm    TR Peak grad:   21.3 mmHg MV Decel Time: 227 msec    TR Vmax:        231.00 cm/s MV E velocity: 76.47 cm/s                            SHUNTS                            Systemic VTI:  0.14 m                            Systemic Diam: 2.00 cm Cherlynn Kaiser MD Electronically signed by Cherlynn Kaiser MD  Signature Date/Time: 05/26/2021/5:49:00 PM    Final    IR PERCUTANEOUS ART THROMBECTOMY/INFUSION INTRACRANIAL INC DIAG ANGIO  Result Date: 05/25/2021 INDICATION: KAYLEA MOUNSEY is an 70 year-old female with a past medical history significant for osteoarthritis and cataracts who presented emergently to the ED at [facility] due to right side weakness, with eyes drifting to left, and unable to talk with decreased level of consciousness. NIHSS at presentation was 19; baseline modified Rankin scale 0. Head CT showed no evidence of a large acute infarct or hemorrhage (ASPECTS 10). CTA showed a proximal left M1/MCA occlusion with good collaterals. She receive IV TNK and was then transferred to our service for a diagnostic cerebral angiogram and mechanical thrombectomy. EXAM: ULTRASOUND GUIDED VASCULAR ACCESS DIAGNOSTIC CEREBRAL ANGIOGRAM MECHANICAL THROMBECTOMY FLAT PANEL HEAD CT COMPARISON:  CT/CTA 05/25/2021. MEDICATIONS: No antibiotic administered. ANESTHESIA/SEDATION: The procedure was performed under general anesthesia. CONTRAST:  25 mL of Omnipaque 240 mg/mL FLUOROSCOPY TIME:  Fluoroscopy Time: 4 minutes 36 seconds (420 mGy). COMPLICATIONS: None immediate. TECHNIQUE: Informed written consent was obtained from the patient's husband after a thorough discussion of the procedural risks, benefits and alternatives. All questions were addressed. Maximal Sterile Barrier Technique was utilized including caps, mask, sterile gowns, sterile gloves, sterile drape, hand hygiene and skin antiseptic. A timeout was performed prior to the initiation of the procedure. The right groin was prepped and draped in the usual sterile fashion. Using a micropuncture kit and the modified Seldinger technique, access was gained to the right common femoral artery and an 8 French sheath was placed. Real-time ultrasound guidance was utilized for vascular access including the acquisition of a permanent ultrasound image documenting patency of the accessed  vessel. Under fluoroscopy, a Zoom 88 guide catheter was navigated over a 6 [place] Berenstein 2 catheter and a 0.035" Terumo Glidewire into the aortic arch. The catheter was placed into the left common carotid artery and then advanced into the left internal carotid artery. The Berenstein 2 catheter was removed. Frontal and lateral angiograms of the head were obtained. FINDINGS: 1. Normal caliber of the right common femoral artery. 2. Occlusion of the left MCA at the proximal M1 segment. PROCEDURE: Under biplane roadmap, a zoom 71 aspiration catheter was navigated over an Aristotle 24 microguidewire into the cavernous segment of the left ICA. The aspiration catheter was then advanced to the level of occlusion and connected to an aspiration pump. Continuous aspiration was performed for 2 minutes. The guide catheter was connected to a VacLok syringe and advanced into the M1 segment while  the aspiration catheter wasremoved under constant aspiration. The Zoom 88 guide catheter was continuously aspirated until clear blood return was obtained. Left ICA angiograms with frontal and lateral views showed complete left MCA vascular tree recanalization without evidence of thromboembolic complication. Flat panel CT of the head was obtained and post processed in a separate workstation with concurrent attending physician supervision. Selected images were sent to PACS. No evidence of hemorrhagic complication. A right common femoral artery angiogram was then obtained in right anterior oblique view via sheath side port. The puncture is at the level of the common femoral artery which has normal caliber and contour, adequate for closure device. The femoral sheath was exchanged over the wire for a Perclose ProStyle which was utilized for access closure. Immediate hemostasis was achieved. IMPRESSION: Successful and uncomplicated mechanical thrombectomy performed with direct contact aspiration for treatment of a proximal left M1/MCA  occlusion with complete recanalization (TICI3). PLAN: 1. Transfer to ICA for continued care. 2. SBP 120-140 mmHg for 24 hours post procedure. 3. Bed rest for 6 hours. Electronically Signed   By: Pedro Earls M.D.   On: 05/25/2021 17:58   CT HEAD CODE STROKE WO CONTRAST  Result Date: 05/25/2021 CLINICAL DATA:  Left-sided weakness EXAM: CT ANGIOGRAPHY HEAD AND NECK TECHNIQUE: Multidetector CT imaging of the head and neck was performed using the standard protocol during bolus administration of intravenous contrast. Multiplanar CT image reconstructions and MIPs were obtained to evaluate the vascular anatomy. Carotid stenosis measurements (when applicable) are obtained utilizing NASCET criteria, using the distal internal carotid diameter as the denominator. CONTRAST:  63m OMNIPAQUE IOHEXOL 350 MG/ML SOLN COMPARISON:  None. FINDINGS: CT HEAD FINDINGS Brain: There is ill-defined hypodensity in the left centrum semiovale. The left basal ganglia and insular cortex are preserved. The cortex throughout the left MCA distribution is preserved. There is focal hypodensity in the right parietal lobe likely reflecting remote infarct. Additional foci of hypodensity in the bilateral cerebral hemispheres are nonspecific but may reflect sequela of chronic white matter microangiopathy. There is no evidence of acute intracranial hemorrhage or extra-axial fluid collection. The ventricles are not enlarged. There is no mass lesion. There is no midline shift. Vascular: There is a dense MCA on the left. See below. Skull: Normal. Negative for fracture or focal lesion. Sinuses/orbits: The imaged paranasal sinuses are clear. The mastoid air cells are clear. Bilateral lens implants are in place. The globes and orbits are otherwise unremarkable. Review of the MIP images confirms the above findings CTA NECK FINDINGS The CTA images are moderately degraded by motion artifact. Aortic arch: Standard branching. Imaged portion shows  no evidence of aneurysm or dissection. No significant stenosis of the major arch vessel origins. Right carotid system: The right carotid system is suboptimally evaluated particularly in the region of the carotid bulb due to motion artifact. The right internal carotid artery has a medialized course. Otherwise, there is no definite focal stenosis, occlusion, dissection, or aneurysm. Left carotid system: The left carotid system is suboptimally evaluated due to motion artifact particularly in the region of the carotid bulb. There is calcified atherosclerotic plaque of the left carotid bulb without definite hemodynamically significant stenosis or occlusion. There is no definite dissection or aneurysm. Vertebral arteries: The vertebral arteries appear patent, without definite focal stenosis, dissection, occlusion, or aneurysm. Skeleton: There is mild multilevel degenerative change of the cervical spine. There is no acute osseous abnormality or aggressive osseous lesion. Other neck: The soft tissues are unremarkable. Upper chest: There is a  1.1 cm x 1.0 cm soft tissue nodule in the medial right apex. Review of the MIP images confirms the above findings CTA HEAD FINDINGS Anterior circulation: There is calcification of the bilateral cavernous ICAs without focal stenosis or occlusion. There is occlusion of the proximal left M1 segment with reconstitution of flow at the M2 segments. There appears to be good collateral flow throughout the peripheral MCA distribution. The right MCA is patent. The bilateral ACAs are patent. The right A1 segment is diminutive. There is no aneurysm. Posterior circulation: The posterior circulation is suboptimally evaluated due to motion artifact. The V4 segments of the vertebral arteries are patent. The basilar artery is patent. There is multifocal moderate stenosis of the left P2 segment (9-256, 10-116, 10-123). The right PCA is patent.  There is no aneurysm. Venous sinuses: As permitted by  contrast timing, patent. Anatomic variants: None. Review of the MIP images confirms the above findings IMPRESSION: 1. Occlusion of the left M1 segment with reconstitution of flow at the M2 segments with good collateral flow throughout the remainder of the MCA distribution. 2. No evidence of left MCA territory infarct at the time of imaging. Aspects is 10. 3. The CTA images are moderately motion degraded. There is no definite high-grade stenosis in the neck. 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 4. 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 Other foci of hypodensity in the supratentorial white matter likely reflects sequela of chronic white matter microangiopathy. 5. 1.1 cm soft tissue nodule in the medial right apex. Consider one of the following in 3 months for both low-risk and high-risk individuals: (a) repeat chest CT, (b) follow-up PET-CT, or (c) tissue sampling. This recommendation follows the consensus statement: Guidelines for Management of Incidental Pulmonary Nodules Detected on CT Images: From the Fleischner Society 2017; Radiology 2017; 284:228-243. The acute results were called by telephone at the time of interpretation on 05/25/2021 at 3:05 pm to provider Dr Cheral Marker , who verbally acknowledged these results. Electronically Signed   By: Valetta Mole M.D.   On: 05/25/2021 15:22   CT ANGIO HEAD NECK W WO CM (CODE STROKE)  Result Date: 05/25/2021 CLINICAL DATA:  Left-sided weakness EXAM: CT ANGIOGRAPHY HEAD AND NECK TECHNIQUE: Multidetector CT imaging of the head and neck was performed using the standard protocol during bolus administration of intravenous contrast. Multiplanar CT image reconstructions and MIPs were obtained to evaluate the vascular anatomy. Carotid stenosis measurements (when applicable) are obtained utilizing NASCET criteria, using the distal internal carotid diameter as the denominator. CONTRAST:  36m OMNIPAQUE IOHEXOL 350 MG/ML SOLN COMPARISON:   None. FINDINGS: CT HEAD FINDINGS Brain: There is ill-defined hypodensity in the left centrum semiovale. The left basal ganglia and insular cortex are preserved. The cortex throughout the left MCA distribution is preserved. There is focal hypodensity in the right parietal lobe likely reflecting remote infarct. Additional foci of hypodensity in the bilateral cerebral hemispheres are nonspecific but may reflect sequela of chronic white matter microangiopathy. There is no evidence of acute intracranial hemorrhage or extra-axial fluid collection. The ventricles are not enlarged. There is no mass lesion. There is no midline shift. Vascular: There is a dense MCA on the left. See below. Skull: Normal. Negative for fracture or focal lesion. Sinuses/orbits: The imaged paranasal sinuses are clear. The mastoid air cells are clear. Bilateral lens implants are in place. The globes and orbits are otherwise unremarkable. Review of the MIP images confirms the above findings CTA NECK FINDINGS The  CTA images are moderately degraded by motion artifact. Aortic arch: Standard branching. Imaged portion shows no evidence of aneurysm or dissection. No significant stenosis of the major arch vessel origins. Right carotid system: The right carotid system is suboptimally evaluated particularly in the region of the carotid bulb due to motion artifact. The right internal carotid artery has a medialized course. Otherwise, there is no definite focal stenosis, occlusion, dissection, or aneurysm. Left carotid system: The left carotid system is suboptimally evaluated due to motion artifact particularly in the region of the carotid bulb. There is calcified atherosclerotic plaque of the left carotid bulb without definite hemodynamically significant stenosis or occlusion. There is no definite dissection or aneurysm. Vertebral arteries: The vertebral arteries appear patent, without definite focal stenosis, dissection, occlusion, or aneurysm. Skeleton:  There is mild multilevel degenerative change of the cervical spine. There is no acute osseous abnormality or aggressive osseous lesion. Other neck: The soft tissues are unremarkable. Upper chest: There is a 1.1 cm x 1.0 cm soft tissue nodule in the medial right apex. Review of the MIP images confirms the above findings CTA HEAD FINDINGS Anterior circulation: There is calcification of the bilateral cavernous ICAs without focal stenosis or occlusion. There is occlusion of the proximal left M1 segment with reconstitution of flow at the M2 segments. There appears to be good collateral flow throughout the peripheral MCA distribution. The right MCA is patent. The bilateral ACAs are patent. The right A1 segment is diminutive. There is no aneurysm. Posterior circulation: The posterior circulation is suboptimally evaluated due to motion artifact. The V4 segments of the vertebral arteries are patent. The basilar artery is patent. There is multifocal moderate stenosis of the left P2 segment (9-256, 10-116, 10-123). The right PCA is patent.  There is no aneurysm. Venous sinuses: As permitted by contrast timing, patent. Anatomic variants: None. Review of the MIP images confirms the above findings IMPRESSION: 1. Occlusion of the left M1 segment with reconstitution of flow at the M2 segments with good collateral flow throughout the remainder of the MCA distribution. 2. No evidence of left MCA territory infarct at the time of imaging. Aspects is 10. 3. The CTA images are moderately motion degraded. There is no definite high-grade stenosis in the neck. 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 4. 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 Other foci of hypodensity in the supratentorial white matter likely reflects sequela of chronic white matter microangiopathy. 5. 1.1 cm soft tissue nodule in the medial right apex. Consider one of the following in 3 months for both low-risk and high-risk  individuals: (a) repeat chest CT, (b) follow-up PET-CT, or (c) tissue sampling. This recommendation follows the consensus statement: Guidelines for Management of Incidental Pulmonary Nodules Detected on CT Images: From the Fleischner Society 2017; Radiology 2017; 284:228-243. The acute results were called by telephone at the time of interpretation on 05/25/2021 at 3:05 pm to provider Dr Cheral Marker , who verbally acknowledged these results. Electronically Signed   By: Valetta Mole M.D.   On: 05/25/2021 15:22     PHYSICAL EXAM  Temp:  [97.7 F (36.5 C)-98.2 F (36.8 C)] 98 F (36.7 C) (09/23 0759) Pulse Rate:  [62-102] 73 (09/23 0802) Resp:  [11-27] 22 (09/23 0802) BP: (101-168)/(54-133) 162/72 (09/23 0802) SpO2:  [90 %-98 %] 95 % (09/23 0802)  General - Well nourished, well developed, in no apparent distress.  Ophthalmologic - fundi not visualized due to noncooperation.  Cardiovascular - irregularly irregular  heart rate and rhythm.  Neuro - awake, alert, eyes open, orientated to place, age, months and people, but not to year.  Mild expressive aphasia occasional word finding difficulty and paraphasic errors, however, following all simple commands. Able to repeat and naming 2/4.  Mild dysarthria.  No gaze palsy, tracking bilaterally, blinking to visual threat bilaterally, PERRL.  Mild right facial droop. Tongue midline.  Left upper extremity 4/5 and right upper extremity no drift 4-/5.  Bilateral lower extremity proximal 3+/5 proximal and 4/5 distal.  Sensation symmetrical bilaterally, b/l FTN intact although slow on the right, gait not tested.    ASSESSMENT/PLAN Deborah Carey is a 70 y.o. female with history of anxiety and HLD admitted for right-sided weakness, left gaze, aphasia and altered mental status. TNK given.    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 fib CT  head no acute abnormality, old right parietal small infarct CT head and neck left M1 occlusion left M2 reconstitution.  Left P2 stenosis MRI left MCA infarcts more prominent at left BG and caudate head, right MCA and ACA punctate infarcts MRA left MCA patent now 2D Echo EF 50 to 55%, left atrial size normal LDL 109 HgbA1c 5.8 SCDs for VTE prophylaxis No antithrombotic prior to admission, now on ASA 335m. Will start DByronover the weekend if neuro stable Ongoing aggressive stroke risk factor management Therapy recommendations: CIR Disposition: Pending  A. Fib New diagnosis EKG confirmed Cardiology consulted, appreciate assistance Rate controlled Will start DOAC over the weekend if neuro stable  Hypotension, improved BP stable S/p bolus and one dose of albumin  BP < 180/105 Long term BP goal normotensive  Hyperlipidemia Home meds: None LDL 109, goal < 70 On Lipitor 40 Continue statin at discharge  Dysphagia Did not pass bedside swallow N.p.o. with sip for meds Pending MBS Speech on board  Other Stroke Risk Factors Advanced age Obesity, Body mass index is 36.33 kg/m.  Hx stroke/TIA on imaging  Other Active Problems CT head and neck showed right lung apex 1cm soft tissue nodule - need outpatient follow-up  Hospital day # 3  This patient is critically ill due to left MCA infarct status post TN K and IR with left M1 occlusion, new onset A. fib and at significant risk of neurological worsening, death form recurrent stroke, hemorrhagic conversion, heart failure. This patient's care requires constant monitoring of vital signs, hemodynamics, respiratory and cardiac monitoring, review of multiple databases, neurological assessment, discussion with family, other specialists and medical decision making of high complexity. I spent 35 minutes of neurocritical care time in the care of this patient. I had long discussion with husband at bedside, updated pt current condition, treatment  plan and potential prognosis, and answered all the questions.  He expressed understanding and appreciation.   Deborah Hawking MD PhD Stroke Neurology 05/28/2021 11:09 AM    To contact Stroke Continuity provider, please refer to [email or web address] After hours, contact General Neurology

## 2021-05-28 NOTE — Progress Notes (Signed)
Speech Language Pathology Treatment: Dysphagia  Patient Details Name: Deborah Carey MRN: [id number] DOB: [birth year] Today's Date: 05/28/2021 Time: 8916-9450 SLP Time Calculation (min) (ACUTE ONLY): 8 min  Assessment / Plan / Recommendation Clinical Impression  Pt, alert and upright in chair, seen for skilled dysphagia therapy this am with spouse present. Pt was currently consuming dysphagia 3, NTL meal upon SLP arrival. No immediate overt s/sx of aspiration observed, though x1 delayed wet cough noted post bite of puree (yogurt) and unsure if related to puree or recent sips of NTL. Spontaneous cough cleared vocal quality to baseline. Reviewed results of instrumental swallow testing on 9/22, including aspiration of thin liquids with poor sensation. Pt and spouse expressed understanding and agreement with results and recommendations. Pt to remain on dysphagia 3, NTL diet at this time. SLP to continue f/u during acute stay.    HPI HPI: 70 y.o. F with PMH of cataracts, anxiety, osteoarthritis who was [facility] the cancer center with her husband when she started having right-sided weakness with left eye deviation and aphasia. CTA with left M1/MCA occlusion and she was taken emergently to IR for mechanical thrombectomy which resulted in complete recanalization. Intubated 9/20 for procedure and extubated the same day.      SLP Plan  Continue with current plan of care      Recommendations for follow up therapy are one component of a multi-disciplinary discharge planning process, led by the attending physician.  Recommendations may be updated based on patient status, additional functional criteria and insurance authorization.    Recommendations  Diet recommendations: Dysphagia 3 (mechanical soft);Nectar-thick liquid Liquids provided via: Cup;Straw Medication Administration: Whole meds with puree Supervision: Patient able to self feed;Full supervision/cueing for compensatory strategies Compensations: Small  sips/bites;Slow rate;Minimize environmental distractions Postural Changes and/or Swallow Maneuvers: Seated upright 90 degrees                Oral Care Recommendations: Oral care BID Follow up Recommendations: Inpatient Rehab SLP Visit Diagnosis: Dysphagia, oropharyngeal phase (R13.12) Plan: Continue with current plan of care       [place], [place], [place] Office Number: [phone]  [name]  05/28/2021, 10:23 AM

## 2021-05-29 DIAGNOSIS — I63412 Cerebral infarction due to embolism of left middle cerebral artery: Secondary | ICD-10-CM | POA: Diagnosis not present

## 2021-05-29 LAB — GLUCOSE, CAPILLARY
Glucose-Capillary: 106 mg/dL — ABNORMAL HIGH (ref 70–99)
Glucose-Capillary: 109 mg/dL — ABNORMAL HIGH (ref 70–99)
Glucose-Capillary: 109 mg/dL — ABNORMAL HIGH (ref 70–99)
Glucose-Capillary: 144 mg/dL — ABNORMAL HIGH (ref 70–99)
Glucose-Capillary: 168 mg/dL — ABNORMAL HIGH (ref 70–99)
Glucose-Capillary: 206 mg/dL — ABNORMAL HIGH (ref 70–99)
Glucose-Capillary: 90 mg/dL (ref 70–99)

## 2021-05-29 LAB — CBC
HCT: 34.8 % — ABNORMAL LOW (ref 36.0–46.0)
Hemoglobin: 12.3 g/dL (ref 12.0–15.0)
MCH: 32.3 pg (ref 26.0–34.0)
MCHC: 35.3 g/dL (ref 30.0–36.0)
MCV: 91.3 fL (ref 80.0–100.0)
Platelets: 193 10*3/uL (ref 150–400)
RBC: 3.81 MIL/uL — ABNORMAL LOW (ref 3.87–5.11)
RDW: 11.9 % (ref 11.5–15.5)
WBC: 8.7 10*3/uL (ref 4.0–10.5)
nRBC: 0 % (ref 0.0–0.2)

## 2021-05-29 LAB — BASIC METABOLIC PANEL
Anion gap: 6 (ref 5–15)
BUN: 10 mg/dL (ref 8–23)
CO2: 28 mmol/L (ref 22–32)
Calcium: 8.9 mg/dL (ref 8.9–10.3)
Chloride: 106 mmol/L (ref 98–111)
Creatinine, Ser: 0.67 mg/dL (ref 0.44–1.00)
GFR, Estimated: >60 mL/min (ref >60)
Glucose, Bld: 106 mg/dL — ABNORMAL HIGH (ref 70–99)
Potassium: 3.4 mmol/L — ABNORMAL LOW (ref 3.5–5.1)
Sodium: 140 mmol/L (ref 135–145)

## 2021-05-29 MED ORDER — APIXABAN 5 MG PO TABS
5.0000 mg | ORAL_TABLET | Freq: Two times a day (BID) | ORAL | Status: DC
  Administered 2021-05-29 – 2021-06-01 (×7): 5 mg via ORAL
  Filled 2021-05-29 (×7): qty 1

## 2021-05-29 NOTE — Progress Notes (Signed)
Physical Therapy Treatment Patient Details Name: Deborah Carey MRN: [id number] DOB: March 13, 1951 Today's Date: 05/29/2021   History of Present Illness 70 y.o. female presents to [facility] on 05/25/2021 with R sided weakness, L eye deviation and aphasia. Pt received TKN and was intubated due to worsening mental status. CTA demonstrates L M1/MCA occlusion, pt taken to IR for mechanical thrombectomy with complete revascularization. PMH includes osteoarthritis, anxiety, and cataracts.    PT Comments    Pt in bed sleeping on arrival. Easy to awaken and agreeable to participation in therapy. Pt required min assist bed mobility, min assist transfers, and min assist ambulation 10' x 2 with RW. Decreased foot clearance on R during swing phase. Pt in recliner at end of session.    Recommendations for follow up therapy are one component of a multi-disciplinary discharge planning process, led by the attending physician.  Recommendations may be updated based on patient status, additional functional criteria and insurance authorization.  Follow Up Recommendations  CIR     Equipment Recommendations  Rolling walker with 5" wheels    Recommendations for Other Services       Precautions / Restrictions Precautions Precautions: Fall Precaution Comments: right sided weakness Restrictions Weight Bearing Restrictions: No     Mobility  Bed Mobility Overal bed mobility: Needs Assistance Bed Mobility: Supine to Sit     Supine to sit: Min assist;HOB elevated     General bed mobility comments: +rail, increased time, cues for sequencing    Transfers Overall transfer level: Needs assistance Equipment used: Rolling walker (2 wheeled) Transfers: Sit to/from Stand Sit to Stand: Min assist         General transfer comment: cues for hand placement and sequencing, assist to power up  Ambulation/Gait Ambulation/Gait assistance: Min assist Gait Distance (Feet): 10 Feet (x 2) Assistive device:  Rolling walker (2 wheeled) Gait Pattern/deviations: Step-through pattern;Decreased step length - right;Decreased dorsiflexion - right Gait velocity: decreased   General Gait Details: decreased foot clearance R   Stairs             Wheelchair Mobility    Modified Rankin (Stroke Patients Only) Modified Rankin (Stroke Patients Only) Pre-Morbid Rankin Score: No symptoms Modified Rankin: Moderately severe disability     Balance Overall balance assessment: Needs assistance Sitting-balance support: Feet supported;No upper extremity supported Sitting balance-Leahy Scale: Good     Standing balance support: Bilateral upper extremity supported;During functional activity Standing balance-Leahy Scale: Poor Standing balance comment: reliant on external support                            Cognition Arousal/Alertness: Awake/alert Behavior During Therapy: Flat affect Overall Cognitive Status: Impaired/Different from baseline Area of Impairment: Attention;Safety/judgement;Awareness;Problem solving;Memory;Following commands                   Current Attention Level: Sustained Memory: Decreased short-term memory Following Commands: Follows one step commands consistently Safety/Judgement: Decreased awareness of safety;Decreased awareness of deficits Awareness: Emergent Problem Solving: Slow processing;Difficulty sequencing;Requires verbal cues;Requires tactile cues        Exercises      General Comments        Pertinent Vitals/Pain Pain Assessment: Faces Faces Pain Scale: No hurt    Home Living                      Prior Function            PT Goals (current  goals can now be found in the care plan section) Acute Rehab PT Goals Patient Stated Goal: not stated Progress towards PT goals: Progressing toward goals    Frequency    Min 4X/week      PT Plan Current plan remains appropriate    Co-evaluation              AM-PAC PT  "6 Clicks" Mobility   Outcome Measure  Help needed turning from your back to your side while in a flat bed without using bedrails?: A Little Help needed moving from lying on your back to sitting on the side of a flat bed without using bedrails?: A Little Help needed moving to and from a bed to a chair (including a wheelchair)?: A Little Help needed standing up from a chair using your arms (e.g., wheelchair or bedside chair)?: A Little Help needed to walk in hospital room?: A Little Help needed climbing 3-5 steps with a railing? 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

## 2021-05-29 NOTE — Plan of Care (Signed)

## 2021-05-29 NOTE — Progress Notes (Signed)
ANTICOAGULATION CONSULT NOTE - Initial Consult  Pharmacy Consult for Apixaban  Indication: atrial fibrillation  No Known Allergies  Patient Measurements: Height: 5\' 4"  (162.6 cm) Weight: 96 kg (211 lb 10.3 oz) IBW/kg (Calculated) : 54.7   Vital Signs: Temp: 97.9 F (36.6 C) (09/24 1151) Temp Source: Oral (09/24 1151) BP: 152/99 (09/24 1151) Pulse Rate: 74 (09/24 1151)  Labs: Recent Labs    05/27/21 0318 05/28/21 0316 05/29/21 0617  HGB 11.6* 11.5* 12.3  HCT 34.5* 33.7* 34.8*  PLT 177 177 193  CREATININE 0.77 0.66 0.67    Estimated Creatinine Clearance: 71.4 mL/min (by C-G formula based on SCr of 0.67 mg/dL).   Medical History: Past Medical History:  Diagnosis Date   Allergy    allergic rhinitis   Anxiety    Cataract    removed both eyes    Infertility, female    Osteoarthritis of both knees    OA    PONV (postoperative nausea and vomiting)    Post-menopausal bleeding    neg endo bx.      Assessment: Patient is a 49YOF who presented to [facility] ED 9/20 with an AIS. TNK was administered in the ED. Patient was discovered to have new onset atrial fibrillation. Patient has remained stable with no acute events over the past few days, so pharmacy has been consulted for apixaban dosing for stroke prevention.   Patient has been treated with ASA 325 mg daily as her only antithrombotic therapy since TNK was administered. Hgb 12.3, PLT WNL.    Plan:  - Start Apixaban 5mg  BID  - Monitor CBC daily, signs/symptoms of bleeding   Naveena Eyman A Nigel Ericsson 05/29/2021,2:36 PM

## 2021-05-29 NOTE — Progress Notes (Signed)
Pt does not want an IV, we spoke with her nurse and told him needs an IV to put in another consult.

## 2021-05-29 NOTE — Plan of Care (Signed)
  Problem: Activity: Goal: Risk for activity intolerance will decrease 05/29/2021 1820 by Delia Chimes, RN Outcome: Progressing 05/29/2021 1819 by Delia Chimes, RN Outcome: Progressing   Problem: Elimination: Goal: Will not experience complications related to bowel motility 05/29/2021 1820 by Delia Chimes, RN Outcome: Progressing 05/29/2021 1819 by Delia Chimes, RN Outcome: Progressing Goal: Will not experience complications related to urinary retention 05/29/2021 1820 by Delia Chimes, RN Outcome: Progressing 05/29/2021 1819 by Delia Chimes, RN Outcome: Progressing   Problem: Pain Managment: Goal: General experience of comfort will improve 05/29/2021 1820 by Delia Chimes, RN Outcome: Progressing 05/29/2021 1819 by Delia Chimes, RN Outcome: Progressing   Problem: Safety: Goal: Ability to remain free from injury will improve 05/29/2021 1820 by Delia Chimes, RN Outcome: Progressing 05/29/2021 1819 by Delia Chimes, RN Outcome: Progressing   Problem: Education: Goal: Knowledge of disease or condition will improve Outcome: Progressing Goal: Knowledge of secondary prevention will improve Outcome: Progressing Goal: Knowledge of patient specific risk factors addressed and post discharge goals established will improve Outcome: Progressing Goal: Individualized Educational Video(s) Outcome: Progressing   Problem: Coping: Goal: Will verbalize positive feelings about self Outcome: Progressing   Problem: Health Behavior/Discharge Planning: Goal: Ability to manage health-related needs will improve Outcome: Progressing   Problem: Ischemic Stroke/TIA Tissue Perfusion: Goal: Complications of ischemic stroke/TIA will be minimized Outcome: Progressing   Problem: Spontaneous Subarachnoid Hemorrhage Tissue Perfusion: Goal: Complications of Spontaneous Subarachnoid Hemorrhage will be minimized Outcome: Progressing

## 2021-05-29 NOTE — Progress Notes (Signed)
Occupational Therapy Treatment Patient Details Name: Deborah Carey MRN: [id number] DOB: 07-Feb-1951 Today's Date: 05/29/2021   History of present illness 70 y.o. female presents to [facility] on 05/25/2021 with R sided weakness, L eye deviation and aphasia. Pt received TKN and was intubated due to worsening mental status. CTA demonstrates L M1/MCA occlusion, pt taken to IR for mechanical thrombectomy with complete revascularization. PMH includes osteoarthritis, anxiety, and cataracts.   OT comments  Deborah Carey is progressing well but continues to be limited by R sided weakness, activity tolerance, poor safety awareness and insight into her deficits. Pt fluctuated min A-min guard for all functional ambulation this session with RW. Pt required moderate verbal cues for attention and safety awareness, and bumped RW into obstacles in the hallway. She demonstrated improved handwriting skills but continues to be slow and deliberate with task. Pt continues to benefit from OT acutely to progress towards her indep baseline. D/c recommendation remains appropriate.    Recommendations for follow up therapy are one component of a multi-disciplinary discharge planning process, led by the attending physician.  Recommendations may be updated based on patient status, additional functional criteria and insurance authorization.    Follow Up Recommendations  CIR    Equipment Recommendations  3 in 1 bedside commode       Precautions / Restrictions Precautions Precautions: Fall Precaution Comments: right sided weakness       Mobility Bed Mobility               General bed mobility comments: pt in chair upon arrival, returned to chair    Transfers Overall transfer level: Needs assistance Equipment used: Rolling walker (2 wheeled) Transfers: Sit to/from Stand Sit to Stand: Min assist         General transfer comment: cues for hand placement, min A for steadynig one standing. pt also standing prior to  therapist ready wtih RW; poor safety awareness    Balance Overall balance assessment: Needs assistance Sitting-balance support: Feet supported;No upper extremity supported Sitting balance-Leahy Scale: Good     Standing balance support: Bilateral upper extremity supported;During functional activity Standing balance-Leahy Scale: Poor Standing balance comment: reliant on external support                           ADL either performed or assessed with clinical judgement   ADL Overall ADL's : Needs assistance/impaired                                       General ADL Comments: pt declined need for ADLs at this time. Session focused on activity tolerance and endurance      Cognition Arousal/Alertness: Awake/alert Behavior During Therapy: Flat affect Overall Cognitive Status: Impaired/Different from baseline Area of Impairment: Attention;Safety/judgement;Problem solving                   Current Attention Level: Sustained   Following Commands: Follows one step commands consistently;Follows multi-step commands inconsistently Safety/Judgement: Decreased awareness of safety Awareness: Emergent Problem Solving: Slow processing;Difficulty sequencing;Requires verbal cues;Requires tactile cues General Comments: pt continues to have flat affect, and poor attention to task and requires verbal cues to attend to environmental obstacles for safety.              General Comments VSS on RA - pt motivated to increase walking distances    Pertinent Vitals/ Pain  Pain Assessment: No/denies pain Pain Intervention(s): Limited activity within patient's tolerance;Monitored during session   Frequency  Min 2X/week        Progress Toward Goals  OT Goals(current goals can now be found in the care plan section)  Progress towards OT goals: Progressing toward goals  Acute Rehab OT Goals Patient Stated Goal: not stated OT Goal Formulation: With  patient/family Time For Goal Achievement: 06/10/21 Potential to Achieve Goals: Good ADL Goals Pt Will Perform Upper Body Bathing: with set-up;sitting Pt Will Perform Lower Body Bathing: with supervision;sit to/from stand Pt Will Perform Upper Body Dressing: with set-up;sitting Pt Will Transfer to Toilet: with min guard assist;ambulating Pt Will Perform Toileting - Clothing Manipulation and hygiene: with supervision;sitting/lateral leans;sit to/from stand Additional ADL Goal #1: Pt will complete 3 step ADL task in moderately distracting environemtn without need for redirection  Plan Discharge plan remains appropriate    Co-evaluation                 AM-PAC OT "6 Clicks" Daily Activity     Outcome Measure   Help from another person eating meals?: A Little (supervision and diet limitations) Help from another person taking care of personal grooming?: A Little Help from another person toileting, which includes using toliet, bedpan, or urinal?: A Little Help from another person bathing (including washing, rinsing, drying)?: A Lot Help from another person to put on and taking off regular upper body clothing?: A Little Help from another person to put on and taking off regular lower body clothing?: A Lot 6 Click Score: 16    End of Session Equipment Utilized During Treatment: Gait belt;Rolling walker  OT Visit Diagnosis: Unsteadiness on feet (R26.81);Other abnormalities of gait and mobility (R26.89);Muscle weakness (generalized) (M62.81);Other symptoms and signs involving cognitive function;Hemiplegia and hemiparesis Hemiplegia - Right/Left: Right Hemiplegia - dominant/non-dominant: Dominant Hemiplegia - caused by: Cerebral infarction   Activity Tolerance Patient tolerated treatment well   Patient Left in chair;with call bell/phone within reach   Nurse Communication Mobility status        Time: 8341-9622 OT Time Calculation (min): 11 min  Charges: OT General Charges $OT  Visit: 1 Visit OT Treatments $Therapeutic Activity: 8-22 mins    [name] A Habeeb Puertas 05/29/2021, 4:36 PM

## 2021-05-29 NOTE — Progress Notes (Signed)
STROKE TEAM PROGRESS NOTE   SUBJECTIVE (INTERVAL HISTORY) Her husband and another family member are at the bedside.  Patient sitting in chair, no complaints..  Continue to improve speech and right facial droop.  No acute event overnight.  Neurological exam is unchanged.  Vital signs are stable.  OBJECTIVE Temp:  [97.9 F (36.6 C)-99.1 F (37.3 C)] 97.9 F (36.6 C) (09/24 1151) Pulse Rate:  [56-85] 74 (09/24 1151) Cardiac Rhythm: Atrial flutter (09/24 0900) Resp:  [16-20] 16 (09/24 1151) BP: (117-159)/(61-99) 152/99 (09/24 1151) SpO2:  [96 %-100 %] 99 % (09/24 1151)  Recent Labs  Lab 05/29/21 0010 05/29/21 0321 05/29/21 0346 05/29/21 0726 05/29/21 1154  GLUCAP 168* 90 106* 109* 206*   Recent Labs  Lab 05/25/21 1520 05/25/21 1907 05/25/21 2204 05/26/21 0429 05/27/21 0318 05/28/21 0316 05/29/21 0617  NA 138  --  138 137 139 138 140  K 4.0  --  4.1 3.9 4.0 3.4* 3.4*  CL 102  --   --  103 105 103 106  CO2 26  --   --  23 27 27 28   GLUCOSE 128*  --   --  146* 90 91 106*  BUN 18  --   --  14 12 9 10   CREATININE 0.83  --   --  0.78 0.77 0.66 0.67  CALCIUM 9.4  --   --  8.9 8.7* 8.6* 8.9  MG  --  2.1  --   --   --   --   --   PHOS  --  3.6  --   --   --   --   --    Recent Labs  Lab 05/25/21 1520 05/26/21 0429  AST 20 18  ALT 16 15  ALKPHOS 68 47  BILITOT 0.8 0.6  PROT 7.3 6.1*  ALBUMIN 4.2 3.4*   Recent Labs  Lab 05/25/21 1447 05/25/21 2204 05/26/21 0429 05/27/21 0318 05/28/21 0316 05/29/21 0617  WBC 10.0  --  11.0* 8.1 9.5 8.7  NEUTROABS 5.9  --   --   --   --   --   HGB 14.8 12.9 13.1 11.6* 11.5* 12.3  HCT 40.3 38.0 38.6 34.5* 33.7* 34.8*  MCV 92.2  --  93.5 94.8 92.6 91.3  PLT 267  --  206 177 177 193   No results for input(s): CKTOTAL, CKMB, CKMBINDEX, TROPONINI in the last 168 hours. No results for input(s): LABPROT, INR in the last 72 hours.  No results for input(s): COLORURINE, LABSPEC, [place], GLUCOSEU, HGBUR, BILIRUBINUR, KETONESUR,  PROTEINUR, UROBILINOGEN, NITRITE, LEUKOCYTESUR in the last 72 hours.  Invalid input(s): APPERANCEUR     Component Value Date/Time   CHOL 171 05/26/2021 0429   TRIG 64 05/26/2021 0429   TRIG 66 05/26/2021 0429   HDL 49 05/26/2021 0429   CHOLHDL 3.5 05/26/2021 0429   VLDL 13 05/26/2021 0429   LDLCALC 109 (H) 05/26/2021 0429   Lab Results  Component Value Date   HGBA1C 5.8 (H) 05/26/2021   No results found for: LABOPIA, COCAINSCRNUR, [place], [place], [place], [place]  Recent Labs  Lab 05/25/21 [place] <10    I have personally reviewed the radiological images below and agree with the radiology interpretations.  CT ABDOMEN PELVIS W WO CONTRAST  Result Date: 05/26/2021 CLINICAL DATA:  Abdominal pain. EXAM: CT ABDOMEN AND PELVIS WITHOUT AND WITH CONTRAST TECHNIQUE: Multidetector CT imaging of the abdomen and pelvis was performed following the standard protocol before and following the bolus  administration of intravenous contrast. CONTRAST:  21m OMNIPAQUE IOHEXOL 350 MG/ML SOLN COMPARISON:  None. FINDINGS: Lower chest: The lung bases are clear of an acute process. No pleural effusions or pulmonary lesions. The heart is within normal limits in size. No pericardial effusion. Hepatobiliary: No hepatic lesions or intrahepatic biliary dilatation. High attenuation material in the gallbladder likely due to vicarious excretion of contrast material from prior CT scan and interventional procedure. There is a 2.4 cm gallstone noted the gallbladder but no findings suspicious for acute cholecystitis. No common bile duct dilatation. Pancreas: No mass, inflammation or ductal dilatation. Spleen: Normal size.  No focal lesions. Adrenals/Urinary Tract: Adrenal glands and kidneys are unremarkable. There is some contrast in both collecting systems. The bladder also contains contrast material. No bladder mass or asymmetric bladder wall thickening. Stomach/Bowel: The stomach, duodenum, small bowel and colon are  unremarkable. No acute inflammatory changes, mass lesions or obstructive findings. The terminal ileum is normal. The appendix is normal. Scattered colonic diverticulosis but no findings for acute diverticulitis. Vascular/Lymphatic: Scattered atherosclerotic calcifications but no aneurysm or dissection. The major venous structures are patent. No mesenteric or retroperitoneal mass or adenopathy. Reproductive: The uterus and ovaries are unremarkable. Other: No pelvic mass or adenopathy. No free pelvic fluid collections. No inguinal mass or adenopathy. Small periumbilical abdominal wall hernia containing fat. Musculoskeletal: No significant bony findings. IMPRESSION: 1. No acute abdominal/pelvic findings, mass lesions or adenopathy. 2. Cholelithiasis without findings suspicious for acute cholecystitis. 3. Scattered colonic diverticulosis but no findings for acute diverticulitis. Electronically Signed   By: PMarijo SanesM.D.   On: 05/26/2021 20:30   MR ANGIO HEAD WO CONTRAST  Result Date: 05/26/2021 CLINICAL DATA:  Follow-up stroke. Left-sided weakness. Left MCA occlusion with intervention. EXAM: MRA HEAD WITHOUT CONTRAST TECHNIQUE: Angiographic images of the Circle of Willis were acquired using MRA technique without intravenous contrast. COMPARISON:  MRI earlier same day FINDINGS: Anterior circulation: Both internal carotid arteries are widely patent through the skull base and siphon regions. The anterior and middle cerebral vessels are patent. Reconstituted flow in the left MCA without proximal stenosis or missing distal branch vessels. Posterior circulation: Both vertebral arteries widely patent to the basilar. No basilar stenosis. Stenoses in the PCA P2 segments, worse on the left than the right. Anatomic variants: None significant. Other: None. IMPRESSION: Good appearance of the anterior circulation flow presently. Status post mechanical thrombectomy on the left. Wide patency of the left MCA branches presently.  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 Electronically Signed   By: MNelson ChimesM.D.   On: 05/26/2021 15:18   MR BRAIN WO CONTRAST  Result Date: 05/26/2021 CLINICAL DATA:  Follow-up stroke. EXAM: MRI HEAD WITHOUT CONTRAST TECHNIQUE: Multiplanar, multiecho pulse sequences of the brain and surrounding structures were obtained without intravenous contrast. COMPARISON:  MR angiography same day. CT studies and intervention done yesterday. FINDINGS: Brain: There is acute infarction affecting the corpus stratum on the left. Minimal punctate infarction in the left posterior frontal subcortical white matter. Swelling of the putamen and caudate, with minimal petechial blood products but without frank hemorrhage. Three punctate foci of acute infarction in the right medial frontal lobe, possibly small embolic infarctions in the right anterior cerebral artery territory. No midline shift. Elsewhere, there is old cortical and subcortical infarction in the right parietal lobe and moderate chronic small-vessel ischemic changes of the cerebral hemispheric white matter. No hydrocephalus. No extra-axial collection. Vascular: Major vessels at the base of the brain show flow.  Skull and upper cervical spine: Negative Sinuses/Orbits: Clear/normal Other: None IMPRESSION: Acute infarction of the left putamen and caudate. Minimal petechial blood products but no frank hematoma. Mild swelling but no midline shift. Three punctate acute infarctions within the medial right frontal lobe, probably micro embolic infarctions in the anterior cerebral artery territory. Old infarction in the right parietal cortical and subcortical brain and seen affecting the cerebral hemispheric deep white matter extensively. Electronically Signed   By: Nelson Chimes M.D.   On: 05/26/2021 15:51   IR CT Head Ltd  Result Date: 05/25/2021 INDICATION: VERSIA MIGNOGNA is an 70 year-old female with a past medical history significant for  osteoarthritis and cataracts who presented emergently to the ED at [facility] due to right side weakness, with eyes drifting to left, and unable to talk with decreased level of consciousness. NIHSS at presentation was 19; baseline modified Rankin scale 0. Head CT showed no evidence of a large acute infarct or hemorrhage (ASPECTS 10). CTA showed a proximal left M1/MCA occlusion with good collaterals. She receive IV TNK and was then transferred to our service for a diagnostic cerebral angiogram and mechanical thrombectomy. EXAM: ULTRASOUND GUIDED VASCULAR ACCESS DIAGNOSTIC CEREBRAL ANGIOGRAM MECHANICAL THROMBECTOMY FLAT PANEL HEAD CT COMPARISON:  CT/CTA 05/25/2021. MEDICATIONS: No antibiotic administered. ANESTHESIA/SEDATION: The procedure was performed under general anesthesia. CONTRAST:  25 mL of Omnipaque 240 mg/mL FLUOROSCOPY TIME:  Fluoroscopy Time: 4 minutes 36 seconds (420 mGy). COMPLICATIONS: None immediate. TECHNIQUE: Informed written consent was obtained from the patient's husband after a thorough discussion of the procedural risks, benefits and alternatives. All questions were addressed. Maximal Sterile Barrier Technique was utilized including caps, mask, sterile gowns, sterile gloves, sterile drape, hand hygiene and skin antiseptic. A timeout was performed prior to the initiation of the procedure. The right groin was prepped and draped in the usual sterile fashion. Using a micropuncture kit and the modified Seldinger technique, access was gained to the right common femoral artery and an 8 French sheath was placed. Real-time ultrasound guidance was utilized for vascular access including the acquisition of a permanent ultrasound image documenting patency of the accessed vessel. Under fluoroscopy, a Zoom 88 guide catheter was navigated over a 6 [place] Berenstein 2 catheter and a 0.035" Terumo Glidewire into the aortic arch. The catheter was placed into the left common carotid artery and then advanced into the left  internal carotid artery. The Berenstein 2 catheter was removed. Frontal and lateral angiograms of the head were obtained. FINDINGS: 1. Normal caliber of the right common femoral artery. 2. Occlusion of the left MCA at the proximal M1 segment. PROCEDURE: Under biplane roadmap, a zoom 71 aspiration catheter was navigated over an Aristotle 24 microguidewire into the cavernous segment of the left ICA. The aspiration catheter was then advanced to the level of occlusion and connected to an aspiration pump. Continuous aspiration was performed for 2 minutes. The guide catheter was connected to a VacLok syringe and advanced into the M1 segment while the aspiration catheter wasremoved under constant aspiration. The Zoom 88 guide catheter was continuously aspirated until clear blood return was obtained. Left ICA angiograms with frontal and lateral views showed complete left MCA vascular tree recanalization without evidence of thromboembolic complication. Flat panel CT of the head was obtained and post processed in a separate workstation with concurrent attending physician supervision. Selected images were sent to PACS. No evidence of hemorrhagic complication. A right common femoral artery angiogram was then obtained in right anterior oblique view via sheath side port. The  puncture is at the level of the common femoral artery which has normal caliber and contour, adequate for closure device. The femoral sheath was exchanged over the wire for a Perclose ProStyle which was utilized for access closure. Immediate hemostasis was achieved. IMPRESSION: Successful and uncomplicated mechanical thrombectomy performed with direct contact aspiration for treatment of a proximal left M1/MCA occlusion with complete recanalization (TICI3). PLAN: 1. Transfer to ICA for continued care. 2. SBP 120-140 mmHg for 24 hours post procedure. 3. Bed rest for 6 hours. Electronically Signed   By: Pedro Earls M.D.   On: 05/25/2021 17:58    IR US Guide Vasc Access Right  Result Date: 05/25/2021 INDICATION: Deborah Carey is an 71 year-old female with a past medical history significant for osteoarthritis and cataracts who presented emergently to the ED at [facility] due to right side weakness, with eyes drifting to left, and unable to talk with decreased level of consciousness. NIHSS at presentation was 19; baseline modified Rankin scale 0. Head CT showed no evidence of a large acute infarct or hemorrhage (ASPECTS 10). CTA showed a proximal left M1/MCA occlusion with good collaterals. She receive IV TNK and was then transferred to our service for a diagnostic cerebral angiogram and mechanical thrombectomy. EXAM: ULTRASOUND GUIDED VASCULAR ACCESS DIAGNOSTIC CEREBRAL ANGIOGRAM MECHANICAL THROMBECTOMY FLAT PANEL HEAD CT COMPARISON:  CT/CTA 05/25/2021. MEDICATIONS: No antibiotic administered. ANESTHESIA/SEDATION: The procedure was performed under general anesthesia. CONTRAST:  25 mL of Omnipaque 240 mg/mL FLUOROSCOPY TIME:  Fluoroscopy Time: 4 minutes 36 seconds (420 mGy). COMPLICATIONS: None immediate. TECHNIQUE: Informed written consent was obtained from the patient's husband after a thorough discussion of the procedural risks, benefits and alternatives. All questions were addressed. Maximal Sterile Barrier Technique was utilized including caps, mask, sterile gowns, sterile gloves, sterile drape, hand hygiene and skin antiseptic. A timeout was performed prior to the initiation of the procedure. The right groin was prepped and draped in the usual sterile fashion. Using a micropuncture kit and the modified Seldinger technique, access was gained to the right common femoral artery and an 8 French sheath was placed. Real-time ultrasound guidance was utilized for vascular access including the acquisition of a permanent ultrasound image documenting patency of the accessed vessel. Under fluoroscopy, a Zoom 88 guide catheter was navigated over a 6 [place] Berenstein  2 catheter and a 0.035" Terumo Glidewire into the aortic arch. The catheter was placed into the left common carotid artery and then advanced into the left internal carotid artery. The Berenstein 2 catheter was removed. Frontal and lateral angiograms of the head were obtained. FINDINGS: 1. Normal caliber of the right common femoral artery. 2. Occlusion of the left MCA at the proximal M1 segment. PROCEDURE: Under biplane roadmap, a zoom 71 aspiration catheter was navigated over an Aristotle 24 microguidewire into the cavernous segment of the left ICA. The aspiration catheter was then advanced to the level of occlusion and connected to an aspiration pump. Continuous aspiration was performed for 2 minutes. The guide catheter was connected to a VacLok syringe and advanced into the M1 segment while the aspiration catheter wasremoved under constant aspiration. The Zoom 88 guide catheter was continuously aspirated until clear blood return was obtained. Left ICA angiograms with frontal and lateral views showed complete left MCA vascular tree recanalization without evidence of thromboembolic complication. Flat panel CT of the head was obtained and post processed in a separate workstation with concurrent attending physician supervision. Selected images were sent to PACS. No evidence of hemorrhagic complication.  A right common femoral artery angiogram was then obtained in right anterior oblique view via sheath side port. The puncture is at the level of the common femoral artery which has normal caliber and contour, adequate for closure device. The femoral sheath was exchanged over the wire for a Perclose ProStyle which was utilized for access closure. Immediate hemostasis was achieved. IMPRESSION: Successful and uncomplicated mechanical thrombectomy performed with direct contact aspiration for treatment of a proximal left M1/MCA occlusion with complete recanalization (TICI3). PLAN: 1. Transfer to ICA for continued care. 2. SBP  120-140 mmHg for 24 hours post procedure. 3. Bed rest for 6 hours. Electronically Signed   By: Pedro Earls M.D.   On: 05/25/2021 17:58   DG CHEST PORT 1 VIEW  Result Date: 05/26/2021 CLINICAL DATA:  Status post intubation. EXAM: PORTABLE CHEST 1 VIEW COMPARISON:  05/25/2021 FINDINGS: The ETT tip is 1.2 cm above the carina. Normal heart size. Interval resolution of previous interstitial and airspace opacities. Remote appearing right posterior rib fractures. IMPRESSION: 1. Stable position of ET tube with tip above the carina. 2. Interval clearing of interstitial edema. Electronically Signed   By: Kerby Moors M.D.   On: 05/26/2021 05:59   DG Chest Portable 1 View  Result Date: 05/25/2021 CLINICAL DATA:  Shortness of breath EXAM: PORTABLE CHEST 1 VIEW COMPARISON:  None. FINDINGS: Endotracheal tube terminates proximally 1.5 cm above the carina. Heart size appears mildly enlarged. Diffuse bilateral interstitial prominence. No pleural effusion or pneumothorax. Age indeterminate fractures of the posterior right seventh and likely eighth ribs. IMPRESSION: 1. Endotracheal tube terminates 1.5 cm above the carina. 2. Diffuse bilateral interstitial prominence may reflect atelectasis, edema, versus atypical/viral infection. 3. Age indeterminate fractures of the posterior right seventh and likely eighth ribs. Electronically Signed   By: Davina Poke D.O.   On: 05/25/2021 16:03   DG Swallowing Func-Speech Pathology  Result Date: 05/27/2021 Table formatting from the original result was not included. Objective Swallowing Evaluation: Type of Study: MBS-Modified Barium Swallow Study  Patient Details Name: Deborah Carey MRN: [id number] Date of Birth: 1950-10-25 Today's Date: 05/27/2021 Time: SLP Start Time (ACUTE ONLY): 1305 -SLP Stop Time (ACUTE ONLY): 1315 SLP Time Calculation (min) (ACUTE ONLY): 10 min Past Medical History: Past Medical History: Diagnosis Date  Allergy   allergic rhinitis  Anxiety    Cataract   removed both eyes   Infertility, female   Osteoarthritis of both knees   OA   PONV (postoperative nausea and vomiting)   Post-menopausal bleeding   neg endo bx. Past Surgical History: Past Surgical History: Procedure Laterality Date  cataract surgery  2011  CESAREAN SECTION    times 2  COLONOSCOPY  2010  hems   ENDOMETRIAL BIOPSY    neg for CA cells  FACIAL COSMETIC SURGERY  2006  face lift  FOOT SURGERY  1998  rt heel spur removal  IR CT HEAD LTD  05/25/2021  IR PERCUTANEOUS ART THROMBECTOMY/INFUSION INTRACRANIAL INC DIAG ANGIO  05/25/2021  IR US GUIDE VASC ACCESS RIGHT  05/25/2021  RADIOLOGY WITH ANESTHESIA N/A 05/25/2021  Procedure: IR WITH ANESTHESIA;  Surgeon: Aletta Edouard, MD;  Location: [facility];  Service: Radiology;  Laterality: N/A;  REFRACTIVE SURGERY  08/1999 HPI: 70 y.o. F with PMH of cataracts, anxiety, osteoarthritis who was at the cancer center with her husband when she started having right-sided weakness with left eye deviation and aphasia. CTA with left M1/MCA occlusion and she was taken emergently to IR for mechanical thrombectomy  which resulted in complete recanalization. Intubated 9/20 for procedure and extubated the same day.  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 Oral deficits c/b delayed oral transit, piecemeal swallow with mechanical soft textures, and reduced barium tablet propulsion to pharynx during barium tablet and nectar thick liquids series (pt able to propel tabelt to pharynx after 3 attempts). Pharyngeal deficits marked by reduced timely swallow initiation with thin liquids/ decreased timely epiglottic inversion allowing for pre swallow spillage and laryngeal penetration and tracheal aspiration of thins via both cup and straw (PAS-7,8). Pt would trigger a delayed throat clear in some instances  but overall sensation of aspiration events remained poor and aspirates did not clear trachea. Pt with isolated instance of laryngeal penetration of nectar thick liquids when with barium tablet series only (PAS-3) nectar thick in isolation yielded intact airway protection as well as remaining POs tested (PAS-1). Recommend dysphagia 3 (mechanical soft) and nectar thick liquids with meds in puree and full supervision with POs. SLP to follow up. SLP Visit Diagnosis Dysphagia, oropharyngeal phase (R13.12) Attention and concentration deficit following -- Frontal lobe and executive function deficit following -- Impact on safety and function Moderate aspiration risk;Mild aspiration risk   CHL IP TREATMENT RECOMMENDATION 05/27/2021 Treatment Recommendations Therapy as outlined in treatment plan below   Prognosis 05/27/2021 Prognosis for Safe Diet Advancement Good Barriers to Reach Goals Language deficits;Time post onset Barriers/Prognosis Comment -- CHL IP DIET RECOMMENDATION 05/27/2021 SLP Diet Recommendations Dysphagia 3 (Mech soft) solids;Nectar thick liquid Liquid Administration via Cup;Straw Medication Administration Whole meds with puree Compensations Small sips/bites;Slow rate;Minimize environmental distractions Postural Changes Seated upright at 90 degrees;Remain semi-upright after after feeds/meals (Comment)   CHL IP OTHER RECOMMENDATIONS 05/27/2021 Recommended Consults -- Oral Care Recommendations Oral care BID Other Recommendations Order thickener from pharmacy   CHL IP FOLLOW UP RECOMMENDATIONS 05/27/2021 Follow up Recommendations Other (comment)   CHL IP FREQUENCY AND DURATION 05/27/2021 Speech Therapy Frequency (ACUTE ONLY) min 2x/week Treatment Duration 2 weeks      CHL IP ORAL PHASE 05/27/2021 Oral Phase Impaired Oral - Pudding Teaspoon -- Oral - Pudding Cup -- Oral - Honey Teaspoon -- Oral - Honey Cup Delayed oral transit Oral - Nectar Teaspoon -- Oral - Nectar Cup Delayed oral transit Oral - Nectar Straw -- Oral -  Thin Teaspoon -- Oral - Thin Cup Delayed oral transit Oral - Thin Straw Delayed oral transit Oral - Puree Delayed oral transit Oral - Mech Soft Delayed oral transit;Piecemeal swallowing;Decreased bolus cohesion Oral - Regular -- Oral - Multi-Consistency -- Oral - Pill Delayed oral transit;Reduced posterior propulsion;Holding of bolus Oral Phase - Comment --  CHL IP PHARYNGEAL PHASE 05/27/2021 Pharyngeal Phase Impaired Pharyngeal- Pudding Teaspoon -- Pharyngeal -- Pharyngeal- Pudding Cup -- Pharyngeal -- Pharyngeal- Honey Teaspoon -- Pharyngeal -- Pharyngeal- Honey Cup Delayed swallow initiation-vallecula;Reduced tongue base retraction;Pharyngeal residue - valleculae Pharyngeal -- Pharyngeal- Nectar Teaspoon -- Pharyngeal -- Pharyngeal- Nectar Cup Delayed swallow initiation-vallecula;Pharyngeal residue - valleculae Pharyngeal -- Pharyngeal- Nectar Straw -- Pharyngeal -- Pharyngeal- Thin Teaspoon -- Pharyngeal -- Pharyngeal- Thin Cup Delayed swallow initiation-pyriform sinuses;Penetration/Aspiration before swallow;Reduced airway/laryngeal closure;Reduced anterior laryngeal mobility;Reduced epiglottic inversion Pharyngeal Material enters airway, passes BELOW cords without attempt by patient to eject out (silent aspiration);Material enters airway, passes BELOW cords and not ejected out despite cough attempt by patient Pharyngeal- Thin Straw Delayed swallow initiation-pyriform sinuses;Reduced airway/laryngeal closure;Penetration/Aspiration  before swallow Pharyngeal Material enters airway, passes BELOW cords without attempt by patient to eject out (silent aspiration);Material enters airway, passes BELOW cords and not ejected out despite cough attempt by patient Pharyngeal- Puree Delayed swallow initiation-vallecula Pharyngeal -- Pharyngeal- Mechanical Soft Delayed swallow initiation-vallecula;Reduced tongue base retraction;Pharyngeal residue - valleculae Pharyngeal -- Pharyngeal- Regular -- Pharyngeal -- Pharyngeal-  Multi-consistency -- Pharyngeal -- Pharyngeal- Pill Reduced airway/laryngeal closure;Penetration/Aspiration before swallow;Penetration/Aspiration during swallow;Pharyngeal residue - valleculae Pharyngeal Material enters airway, remains ABOVE vocal cords and not ejected out Pharyngeal Comment --  CHL IP CERVICAL ESOPHAGEAL PHASE 05/27/2021 Cervical Esophageal Phase WFL Pudding Teaspoon -- Pudding Cup -- Honey Teaspoon -- Honey Cup -- Nectar Teaspoon -- Nectar Cup -- Nectar Straw -- Thin Teaspoon -- Thin Cup -- Thin Straw -- Puree -- Mechanical Soft -- Regular -- Multi-consistency -- Pill -- Cervical Esophageal Comment -- [place] E Hartness MA, CCC-SLP 05/27/2021, 2:16 PM              ECHOCARDIOGRAM COMPLETE  Result Date: 05/26/2021    ECHOCARDIOGRAM REPORT   Patient Name:   Deborah Carey Date of Exam: 05/26/2021 Medical Rec #:  [id number]     Height:       64.0 in Accession #:    [id number]    Weight:       211.6 lb Date of Birth:  02/06/1951      BSA:          2.004 m Patient Age:    42 years      BP:           129/61 mmHg Patient Gender: F             HR:           68 bpm. Exam Location:  Inpatient Procedure: 2D Echo, Cardiac Doppler, Color Doppler, Intracardiac Opacification            Agent and 3D Echo Indications:    Stroke I63.9  History:        Patient has no prior history of Echocardiogram examinations.                 Risk Factors:Hypertension. Acute respiratory insufficiency                 secondary to acute encephalopathy in the setting of CVA. Acute                 left MCA stroke s/p TNK and mechanical thrombectomy with                 complete revascularization. New diagnosis of atrial                 fibrillation, patient has converted to sinus rhythm.  Sonographer:    Darlina Sicilian RDCS Referring Phys: [id number] Mark  1. Left ventricular ejection fraction, by estimation, is 50 to 55%. The left ventricle has low normal function. The left ventricle demonstrates regional wall  motion abnormalities - mild septal hypokinesis. There is mild left ventricular hypertrophy of the septal segment. Left ventricular diastolic parameters are indeterminate.  2. Right ventricular systolic function is normal. The right ventricular size is normal. There is normal pulmonary artery systolic pressure. The estimated right ventricular systolic pressure is 54.0 mmHg.  3. The mitral valve is normal in structure. Trivial mitral valve regurgitation. No evidence of mitral stenosis.  4. Tricuspid valve regurgitation is mild to moderate.  5. The aortic valve is grossly normal. Aortic  valve regurgitation is not visualized. No aortic stenosis is present.  6. The inferior vena cava is normal in size with <50% respiratory variability, suggesting right atrial pressure of 8 mmHg. FINDINGS  Left Ventricle: Left ventricular ejection fraction, by estimation, is 50 to 55%. The left ventricle has low normal function. The left ventricle demonstrates regional wall motion abnormalities. 3D left ventricular ejection fraction analysis performed but  not reported based on interpreter judgement due to suboptimal quality. The left ventricular internal cavity size was normal in size. There is mild left ventricular hypertrophy of the septal segment. Left ventricular diastolic parameters are indeterminate. Right Ventricle: The right ventricular size is normal. Right vetricular wall thickness was not well visualized. Right ventricular systolic function is normal. There is normal pulmonary artery systolic pressure. The tricuspid regurgitant velocity is 2.31 m/s, and with an assumed right atrial pressure of 8 mmHg, the estimated right ventricular systolic pressure is 77.4 mmHg. Left Atrium: Left atrial size was normal in size. Right Atrium: Right atrial size was normal in size. Pericardium: There is no evidence of pericardial effusion. Mitral Valve: The mitral valve is normal in structure. Mild mitral annular calcification. Trivial mitral  valve regurgitation. No evidence of mitral valve stenosis. Tricuspid Valve: The tricuspid valve is normal in structure. Tricuspid valve regurgitation is mild to moderate. No evidence of tricuspid stenosis. Aortic Valve: The aortic valve is grossly normal. Aortic valve regurgitation is not visualized. No aortic stenosis is present. Pulmonic Valve: The pulmonic valve was not well visualized. Pulmonic valve regurgitation is trivial. No evidence of pulmonic stenosis. Aorta: The aortic root is normal in size and structure. Venous: The inferior vena cava is normal in size with less than 50% respiratory variability, suggesting right atrial pressure of 8 mmHg. IAS/Shunts: No atrial level shunt detected by color flow Doppler.  LEFT VENTRICLE PLAX 2D LVIDd:         4.70 cm     Diastology LVIDs:         3.50 cm     LV e' medial:    10.10 cm/s LV PW:         0.80 cm     LV E/e' medial:  7.6 LV IVS:        1.10 cm     LV e' lateral:   9.87 cm/s LVOT diam:     2.00 cm     LV E/e' lateral: 7.7 LV SV:         44 LV SV Index:   22 LVOT Area:     3.14 cm  LV Volumes (MOD) LV vol d, MOD A2C: 76.2 ml LV vol d, MOD A4C: 57.2 ml LV vol s, MOD A2C: 20.0 ml LV vol s, MOD A4C: 12.7 ml LV SV MOD A2C:     56.2 ml LV SV MOD A4C:     57.2 ml LV SV MOD BP:      53.3 ml RIGHT VENTRICLE RV S prime:     12.30 cm/s TAPSE (M-mode): 1.7 cm LEFT ATRIUM             Index       RIGHT ATRIUM           Index LA diam:        4.00 cm 2.00 cm/m  RA Area:     12.10 cm LA Vol (A2C):   47.0 ml 23.45 ml/m RA Volume:   23.00 ml  11.48 ml/m LA Vol (A4C):   64.2 ml 32.04 ml/m  LA Biplane Vol: 54.5 ml 27.20 ml/m  AORTIC VALVE LVOT Vmax:   71.68 cm/s LVOT Vmean:  45.300 cm/s LVOT VTI:    0.138 m  AORTA Ao Root diam: 3.00 cm Ao Asc diam:  2.80 cm MITRAL VALVE               TRICUSPID VALVE MV Area (PHT): 3.34 cm    TR Peak grad:   21.3 mmHg MV Decel Time: 227 msec    TR Vmax:        231.00 cm/s MV E velocity: 76.47 cm/s                            SHUNTS                             Systemic VTI:  0.14 m                            Systemic Diam: 2.00 cm Cherlynn Kaiser MD Electronically signed by Cherlynn Kaiser MD Signature Date/Time: 05/26/2021/5:49:00 PM    Final    IR PERCUTANEOUS ART THROMBECTOMY/INFUSION INTRACRANIAL INC DIAG ANGIO  Result Date: 05/25/2021 INDICATION: Deborah Carey is an 70 year-old female with a past medical history significant for osteoarthritis and cataracts who presented emergently to the ED at [facility] due to right side weakness, with eyes drifting to left, and unable to talk with decreased level of consciousness. NIHSS at presentation was 19; baseline modified Rankin scale 0. Head CT showed no evidence of a large acute infarct or hemorrhage (ASPECTS 10). CTA showed a proximal left M1/MCA occlusion with good collaterals. She receive IV TNK and was then transferred to our service for a diagnostic cerebral angiogram and mechanical thrombectomy. EXAM: ULTRASOUND GUIDED VASCULAR ACCESS DIAGNOSTIC CEREBRAL ANGIOGRAM MECHANICAL THROMBECTOMY FLAT PANEL HEAD CT COMPARISON:  CT/CTA 05/25/2021. MEDICATIONS: No antibiotic administered. ANESTHESIA/SEDATION: The procedure was performed under general anesthesia. CONTRAST:  25 mL of Omnipaque 240 mg/mL FLUOROSCOPY TIME:  Fluoroscopy Time: 4 minutes 36 seconds (420 mGy). COMPLICATIONS: None immediate. TECHNIQUE: Informed written consent was obtained from the patient's husband after a thorough discussion of the procedural risks, benefits and alternatives. All questions were addressed. Maximal Sterile Barrier Technique was utilized including caps, mask, sterile gowns, sterile gloves, sterile drape, hand hygiene and skin antiseptic. A timeout was performed prior to the initiation of the procedure. The right groin was prepped and draped in the usual sterile fashion. Using a micropuncture kit and the modified Seldinger technique, access was gained to the right common femoral artery and an 8 French sheath was placed.  Real-time ultrasound guidance was utilized for vascular access including the acquisition of a permanent ultrasound image documenting patency of the accessed vessel. Under fluoroscopy, a Zoom 88 guide catheter was navigated over a 6 [place] Berenstein 2 catheter and a 0.035" Terumo Glidewire into the aortic arch. The catheter was placed into the left common carotid artery and then advanced into the left internal carotid artery. The Berenstein 2 catheter was removed. Frontal and lateral angiograms of the head were obtained. FINDINGS: 1. Normal caliber of the right common femoral artery. 2. Occlusion of the left MCA at the proximal M1 segment. PROCEDURE: Under biplane roadmap, a zoom 71 aspiration catheter was navigated over an Aristotle 24 microguidewire into the cavernous segment of the left ICA. The aspiration catheter was then advanced to  the level of occlusion and connected to an aspiration pump. Continuous aspiration was performed for 2 minutes. The guide catheter was connected to a VacLok syringe and advanced into the M1 segment while the aspiration catheter wasremoved under constant aspiration. The Zoom 88 guide catheter was continuously aspirated until clear blood return was obtained. Left ICA angiograms with frontal and lateral views showed complete left MCA vascular tree recanalization without evidence of thromboembolic complication. Flat panel CT of the head was obtained and post processed in a separate workstation with concurrent attending physician supervision. Selected images were sent to PACS. No evidence of hemorrhagic complication. A right common femoral artery angiogram was then obtained in right anterior oblique view via sheath side port. The puncture is at the level of the common femoral artery which has normal caliber and contour, adequate for closure device. The femoral sheath was exchanged over the wire for a Perclose ProStyle which was utilized for access closure. Immediate hemostasis was  achieved. IMPRESSION: Successful and uncomplicated mechanical thrombectomy performed with direct contact aspiration for treatment of a proximal left M1/MCA occlusion with complete recanalization (TICI3). PLAN: 1. Transfer to ICA for continued care. 2. SBP 120-140 mmHg for 24 hours post procedure. 3. Bed rest for 6 hours. Electronically Signed   By: Pedro Earls M.D.   On: 05/25/2021 17:58   CT HEAD CODE STROKE WO CONTRAST  Result Date: 05/25/2021 CLINICAL DATA:  Left-sided weakness EXAM: CT ANGIOGRAPHY HEAD AND NECK TECHNIQUE: Multidetector CT imaging of the head and neck was performed using the standard protocol during bolus administration of intravenous contrast. Multiplanar CT image reconstructions and MIPs were obtained to evaluate the vascular anatomy. Carotid stenosis measurements (when applicable) are obtained utilizing NASCET criteria, using the distal internal carotid diameter as the denominator. CONTRAST:  59m OMNIPAQUE IOHEXOL 350 MG/ML SOLN COMPARISON:  None. FINDINGS: CT HEAD FINDINGS Brain: There is ill-defined hypodensity in the left centrum semiovale. The left basal ganglia and insular cortex are preserved. The cortex throughout the left MCA distribution is preserved. There is focal hypodensity in the right parietal lobe likely reflecting remote infarct. Additional foci of hypodensity in the bilateral cerebral hemispheres are nonspecific but may reflect sequela of chronic white matter microangiopathy. There is no evidence of acute intracranial hemorrhage or extra-axial fluid collection. The ventricles are not enlarged. There is no mass lesion. There is no midline shift. Vascular: There is a dense MCA on the left. See below. Skull: Normal. Negative for fracture or focal lesion. Sinuses/orbits: The imaged paranasal sinuses are clear. The mastoid air cells are clear. Bilateral lens implants are in place. The globes and orbits are otherwise unremarkable. Review of the MIP images  confirms the above findings CTA NECK FINDINGS The CTA images are moderately degraded by motion artifact. Aortic arch: Standard branching. Imaged portion shows no evidence of aneurysm or dissection. No significant stenosis of the major arch vessel origins. Right carotid system: The right carotid system is suboptimally evaluated particularly in the region of the carotid bulb due to motion artifact. The right internal carotid artery has a medialized course. Otherwise, there is no definite focal stenosis, occlusion, dissection, or aneurysm. Left carotid system: The left carotid system is suboptimally evaluated due to motion artifact particularly in the region of the carotid bulb. There is calcified atherosclerotic plaque of the left carotid bulb without definite hemodynamically significant stenosis or occlusion. There is no definite dissection or aneurysm. Vertebral arteries: The vertebral arteries appear patent, without definite focal stenosis, dissection, occlusion, or aneurysm.  Skeleton: There is mild multilevel degenerative change of the cervical spine. There is no acute osseous abnormality or aggressive osseous lesion. Other neck: The soft tissues are unremarkable. Upper chest: There is a 1.1 cm x 1.0 cm soft tissue nodule in the medial right apex. Review of the MIP images confirms the above findings CTA HEAD FINDINGS Anterior circulation: There is calcification of the bilateral cavernous ICAs without focal stenosis or occlusion. There is occlusion of the proximal left M1 segment with reconstitution of flow at the M2 segments. There appears to be good collateral flow throughout the peripheral MCA distribution. The right MCA is patent. The bilateral ACAs are patent. The right A1 segment is diminutive. There is no aneurysm. Posterior circulation: The posterior circulation is suboptimally evaluated due to motion artifact. The V4 segments of the vertebral arteries are patent. The basilar artery is patent. There is  multifocal moderate stenosis of the left P2 segment (9-256, 10-116, 10-123). The right PCA is patent.  There is no aneurysm. Venous sinuses: As permitted by contrast timing, patent. Anatomic variants: None. Review of the MIP images confirms the above findings IMPRESSION: 1. Occlusion of the left M1 segment with reconstitution of flow at the M2 segments with good collateral flow throughout the remainder of the MCA distribution. 2. No evidence of left MCA territory infarct at the time of imaging. Aspects is 10. 3. The CTA images are moderately motion degraded. There is no definite high-grade stenosis in the neck. 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 4. 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 Other foci of hypodensity in the supratentorial white matter likely reflects sequela of chronic white matter microangiopathy. 5. 1.1 cm soft tissue nodule in the medial right apex. Consider one of the following in 3 months for both low-risk and high-risk individuals: (a) repeat chest CT, (b) follow-up PET-CT, or (c) tissue sampling. This recommendation follows the consensus statement: Guidelines for Management of Incidental Pulmonary Nodules Detected on CT Images: From the Fleischner Society 2017; Radiology 2017; 284:228-243. The acute results were called by telephone at the time of interpretation on 05/25/2021 at 3:05 pm to provider Dr Cheral Marker , who verbally acknowledged these results. Electronically Signed   By: Valetta Mole M.D.   On: 05/25/2021 15:22   CT ANGIO HEAD NECK W WO CM (CODE STROKE)  Result Date: 05/25/2021 CLINICAL DATA:  Left-sided weakness EXAM: CT ANGIOGRAPHY HEAD AND NECK TECHNIQUE: Multidetector CT imaging of the head and neck was performed using the standard protocol during bolus administration of intravenous contrast. Multiplanar CT image reconstructions and MIPs were obtained to evaluate the vascular anatomy. Carotid stenosis measurements (when applicable) are  obtained utilizing NASCET criteria, using the distal internal carotid diameter as the denominator. CONTRAST:  15m OMNIPAQUE IOHEXOL 350 MG/ML SOLN COMPARISON:  None. FINDINGS: CT HEAD FINDINGS Brain: There is ill-defined hypodensity in the left centrum semiovale. The left basal ganglia and insular cortex are preserved. The cortex throughout the left MCA distribution is preserved. There is focal hypodensity in the right parietal lobe likely reflecting remote infarct. Additional foci of hypodensity in the bilateral cerebral hemispheres are nonspecific but may reflect sequela of chronic white matter microangiopathy. There is no evidence of acute intracranial hemorrhage or extra-axial fluid collection. The ventricles are not enlarged. There is no mass lesion. There is no midline shift. Vascular: There is a dense MCA on the left. See below. Skull: Normal. Negative for fracture or focal lesion. Sinuses/orbits: The imaged paranasal sinuses are  clear. The mastoid air cells are clear. Bilateral lens implants are in place. The globes and orbits are otherwise unremarkable. Review of the MIP images confirms the above findings CTA NECK FINDINGS The CTA images are moderately degraded by motion artifact. Aortic arch: Standard branching. Imaged portion shows no evidence of aneurysm or dissection. No significant stenosis of the major arch vessel origins. Right carotid system: The right carotid system is suboptimally evaluated particularly in the region of the carotid bulb due to motion artifact. The right internal carotid artery has a medialized course. Otherwise, there is no definite focal stenosis, occlusion, dissection, or aneurysm. Left carotid system: The left carotid system is suboptimally evaluated due to motion artifact particularly in the region of the carotid bulb. There is calcified atherosclerotic plaque of the left carotid bulb without definite hemodynamically significant stenosis or occlusion. There is no definite  dissection or aneurysm. Vertebral arteries: The vertebral arteries appear patent, without definite focal stenosis, dissection, occlusion, or aneurysm. Skeleton: There is mild multilevel degenerative change of the cervical spine. There is no acute osseous abnormality or aggressive osseous lesion. Other neck: The soft tissues are unremarkable. Upper chest: There is a 1.1 cm x 1.0 cm soft tissue nodule in the medial right apex. Review of the MIP images confirms the above findings CTA HEAD FINDINGS Anterior circulation: There is calcification of the bilateral cavernous ICAs without focal stenosis or occlusion. There is occlusion of the proximal left M1 segment with reconstitution of flow at the M2 segments. There appears to be good collateral flow throughout the peripheral MCA distribution. The right MCA is patent. The bilateral ACAs are patent. The right A1 segment is diminutive. There is no aneurysm. Posterior circulation: The posterior circulation is suboptimally evaluated due to motion artifact. The V4 segments of the vertebral arteries are patent. The basilar artery is patent. There is multifocal moderate stenosis of the left P2 segment (9-256, 10-116, 10-123). The right PCA is patent.  There is no aneurysm. Venous sinuses: As permitted by contrast timing, patent. Anatomic variants: None. Review of the MIP images confirms the above findings IMPRESSION: 1. Occlusion of the left M1 segment with reconstitution of flow at the M2 segments with good collateral flow throughout the remainder of the MCA distribution. 2. No evidence of left MCA territory infarct at the time of imaging. Aspects is 10. 3. The CTA images are moderately motion degraded. There is no definite high-grade stenosis in the neck. 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 4. 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 Other foci of hypodensity in the supratentorial white matter likely reflects sequela of chronic white  matter microangiopathy. 5. 1.1 cm soft tissue nodule in the medial right apex. Consider one of the following in 3 months for both low-risk and high-risk individuals: (a) repeat chest CT, (b) follow-up PET-CT, or (c) tissue sampling. This recommendation follows the consensus statement: Guidelines for Management of Incidental Pulmonary Nodules Detected on CT Images: From the Fleischner Society 2017; Radiology 2017; 284:228-243. The acute results were called by telephone at the time of interpretation on 05/25/2021 at 3:05 pm to provider Dr Cheral Marker , who verbally acknowledged these results. Electronically Signed   By: Valetta Mole M.D.   On: 05/25/2021 15:22     PHYSICAL EXAM  Temp:  [97.9 F (36.6 C)-99.1 F (37.3 C)] 97.9 F (36.6 C) (09/24 1151) Pulse Rate:  [56-85] 74 (09/24 1151) Resp:  [16-20] 16 (09/24 1151) BP: (117-159)/(61-99) 152/99 (09/24 1151) SpO2:  [  96 %-100 %] 99 % (09/24 1151)  General - Well nourished, well developed, in no apparent distress.  Ophthalmologic - fundi not visualized due to noncooperation.  Cardiovascular - irregularly irregular heart rate and rhythm.  Neuro - awake, alert, eyes open, orientated to place, age, months and people, but not to year.  Mild expressive aphasia occasional word finding difficulty and paraphasic errors, however, following all simple commands. Able to repeat and naming 2/4.  Mild dysarthria.  No gaze palsy, tracking bilaterally, blinking to visual threat bilaterally, PERRL.  Mild right facial droop. Tongue midline.  Left upper extremity 4/5 and right upper extremity no drift 4-/5.  Bilateral lower extremity proximal 3+/5 proximal and 4/5 distal.  Sensation symmetrical bilaterally, b/l FTN intact although slow on the right, gait not tested.    ASSESSMENT/PLAN Deborah Carey is a 70 y.o. female with history of anxiety and HLD admitted for right-sided weakness, left gaze, aphasia and altered mental status. TNK given.    Stroke:  left MCA  moderate infarct in the right MCA and ACA punctate infarcts with left M1 occlusion s/p TNK and IR with TICI3, embolic secondary to new diagnosed A. fib CT head no acute abnormality, old right parietal small infarct CT head and neck left M1 occlusion left M2 reconstitution.  Left P2 stenosis MRI left MCA infarcts more prominent at left BG and caudate head, right MCA and ACA punctate infarcts MRA left MCA patent now 2D Echo EF 50 to 55%, left atrial size normal LDL 109 HgbA1c 5.8 SCDs for VTE prophylaxis No antithrombotic prior to admission, now on ASA 310m. Will start DCoyvilletoday 05/29/21 Ongoing aggressive stroke risk factor management Therapy recommendations: CIR Disposition: Pending  A. Fib New diagnosis EKG confirmed Cardiology consulted, appreciate assistance Rate controlled Will start DOAC over the weekend if neuro stable  Hypotension, improved BP stable S/p bolus and one dose of albumin  BP < 180/105 Long term BP goal normotensive  Hyperlipidemia Home meds: None LDL 109, goal < 70 On Lipitor 40 Continue statin at discharge  Dysphagia Did not pass bedside swallow N.p.o. with sip for meds Pending MBS Speech on board  Other Stroke Risk Factors Advanced age Obesity, Body mass index is 36.33 kg/m.  Hx stroke/TIA on imaging  Other Active Problems CT head and neck showed right lung apex 1cm soft tissue nodule - need outpatient follow-up  Hospital day # 4 Patient seems to be doing well and has made significant improvement.  It is day 4 today since her stroke and hence I recommend we change aspirin to Eliquis.  Continue ongoing therapies.  Hopefully transfer to rehab over the next few days.  Long discussion with patient and family at the bedside and answered questions.  Greater than 50% time during the 35-minute visit was spent on counseling and coordination of care and discussion with patient and family about embolic stroke and atrial fibrillation and risk-benefit of  anticoagulation and answering questions. PAntony Contras MD Stroke Neurology 05/29/2021 1:43 PM    To contact Stroke Continuity provider, please refer to [email or web address] After hours, contact General Neurology

## 2021-05-30 DIAGNOSIS — I63412 Cerebral infarction due to embolism of left middle cerebral artery: Secondary | ICD-10-CM | POA: Diagnosis not present

## 2021-05-30 LAB — BASIC METABOLIC PANEL
Anion gap: 8 (ref 5–15)
BUN: 11 mg/dL (ref 8–23)
CO2: 27 mmol/L (ref 22–32)
Calcium: 9 mg/dL (ref 8.9–10.3)
Chloride: 104 mmol/L (ref 98–111)
Creatinine, Ser: 0.75 mg/dL (ref 0.44–1.00)
GFR, Estimated: >60 mL/min (ref >60)
Glucose, Bld: 111 mg/dL — ABNORMAL HIGH (ref 70–99)
Potassium: 3.6 mmol/L (ref 3.5–5.1)
Sodium: 139 mmol/L (ref 135–145)

## 2021-05-30 LAB — CBC
HCT: 35.2 % — ABNORMAL LOW (ref 36.0–46.0)
Hemoglobin: 12.8 g/dL (ref 12.0–15.0)
MCH: 32.6 pg (ref 26.0–34.0)
MCHC: 36.4 g/dL — ABNORMAL HIGH (ref 30.0–36.0)
MCV: 89.6 fL (ref 80.0–100.0)
Platelets: 207 10*3/uL (ref 150–400)
RBC: 3.93 MIL/uL (ref 3.87–5.11)
RDW: 11.9 % (ref 11.5–15.5)
WBC: 10.6 10*3/uL — ABNORMAL HIGH (ref 4.0–10.5)
nRBC: 0 % (ref 0.0–0.2)

## 2021-05-30 LAB — GLUCOSE, CAPILLARY
Glucose-Capillary: 102 mg/dL — ABNORMAL HIGH (ref 70–99)
Glucose-Capillary: 111 mg/dL — ABNORMAL HIGH (ref 70–99)
Glucose-Capillary: 113 mg/dL — ABNORMAL HIGH (ref 70–99)
Glucose-Capillary: 121 mg/dL — ABNORMAL HIGH (ref 70–99)
Glucose-Capillary: 127 mg/dL — ABNORMAL HIGH (ref 70–99)
Glucose-Capillary: 172 mg/dL — ABNORMAL HIGH (ref 70–99)
Glucose-Capillary: 99 mg/dL (ref 70–99)

## 2021-05-30 NOTE — Progress Notes (Signed)
STROKE TEAM PROGRESS NOTE   SUBJECTIVE (INTERVAL HISTORY) Her friend is visiting her at the bedside.  Patient sitting in chair, no complaints..  Continue to improve speech and right facial droop.  No acute event overnight.  Neurological exam is unchanged.  Vital signs are stable.  OBJECTIVE Temp:  [97.7 F (36.5 C)-99 F (37.2 C)] 99 F (37.2 C) (09/25 0721) Pulse Rate:  [59-77] 77 (09/25 0721) Cardiac Rhythm: Atrial flutter (09/25 0755) Resp:  [15-19] 15 (09/25 0721) BP: (112-159)/(56-97) 114/56 (09/25 0721) SpO2:  [94 %-99 %] 94 % (09/25 0721)  Recent Labs  Lab 05/29/21 1642 05/29/21 1951 05/30/21 0007 05/30/21 0329 05/30/21 0724  GLUCAP 109* 144* 111* 113* 102*   Recent Labs  Lab 05/25/21 1907 05/25/21 2204 05/26/21 0429 05/27/21 0318 05/28/21 0316 05/29/21 0617 05/30/21 0447  NA  --    < > 137 139 138 140 139  K  --    < > 3.9 4.0 3.4* 3.4* 3.6  CL  --   --  103 105 103 106 104  CO2  --   --  _0 GLUCOSE  --   --  146* 90 91 106* 111*  BUN  --   --  _1 CREATININE  --   --  0.78 0.77 0.66 0.67 0.75  CALCIUM  --   --  8.9 8.7* 8.6* 8.9 9.0  MG 2.1  --   --   --   --   --   --   PHOS 3.6  --   --   --   --   --   --    < > = values in this interval not displayed.   Recent Labs  Lab 05/25/21 1520 05/26/21 0429  AST 20 18  ALT 16 15  ALKPHOS 68 47  BILITOT 0.8 0.6  PROT 7.3 6.1*  ALBUMIN 4.2 3.4*   Recent Labs  Lab 05/25/21 1447 05/25/21 2204 05/26/21 0429 05/27/21 0318 05/28/21 0316 05/29/21 0617 05/30/21 0447  WBC 10.0  --  11.0* 8.1 9.5 8.7 10.6*  NEUTROABS 5.9  --   --   --   --   --   --   HGB 14.8   < > 13.1 11.6* 11.5* 12.3 12.8  HCT 40.3   < > 38.6 34.5* 33.7* 34.8* 35.2*  MCV 92.2  --  93.5 94.8 92.6 91.3 89.6  PLT 267  --  206 177 177 193 207   < > = values in this interval not displayed.   No results for input(s): CKTOTAL, CKMB, CKMBINDEX, TROPONINI in the last 168 hours. No results for input(s): LABPROT, INR  in the last 72 hours.  No results for input(s): COLORURINE, LABSPEC, [place], GLUCOSEU, HGBUR, BILIRUBINUR, KETONESUR, PROTEINUR, UROBILINOGEN, NITRITE, LEUKOCYTESUR in the last 72 hours.  Invalid input(s): APPERANCEUR     Component Value Date/Time   CHOL 171 05/26/2021 0429   TRIG 64 05/26/2021 0429   TRIG 66 05/26/2021 0429   HDL 49 05/26/2021 0429   CHOLHDL 3.5 05/26/2021 0429   VLDL 13 05/26/2021 0429   LDLCALC 109 (H) 05/26/2021 0429   Lab Results  Component Value Date   HGBA1C 5.8 (H) 05/26/2021   No results found for: LABOPIA, COCAINSCRNUR, [place], [place], [place], [place]  Recent Labs  Lab 05/25/21 [place] <10    I have personally reviewed the radiological images below and agree with the radiology interpretations.  CT ABDOMEN  PELVIS W WO CONTRAST  Result Date: 05/26/2021 CLINICAL DATA:  Abdominal pain. EXAM: CT ABDOMEN AND PELVIS WITHOUT AND WITH CONTRAST TECHNIQUE: Multidetector CT imaging of the abdomen and pelvis was performed following the standard protocol before and following the bolus administration of intravenous contrast. CONTRAST:  74m OMNIPAQUE IOHEXOL 350 MG/ML SOLN COMPARISON:  None. FINDINGS: Lower chest: The lung bases are clear of an acute process. No pleural effusions or pulmonary lesions. The heart is within normal limits in size. No pericardial effusion. Hepatobiliary: No hepatic lesions or intrahepatic biliary dilatation. High attenuation material in the gallbladder likely due to vicarious excretion of contrast material from prior CT scan and interventional procedure. There is a 2.4 cm gallstone noted the gallbladder but no findings suspicious for acute cholecystitis. No common bile duct dilatation. Pancreas: No mass, inflammation or ductal dilatation. Spleen: Normal size.  No focal lesions. Adrenals/Urinary Tract: Adrenal glands and kidneys are unremarkable. There is some contrast in both collecting systems. The bladder also contains contrast material. No  bladder mass or asymmetric bladder wall thickening. Stomach/Bowel: The stomach, duodenum, small bowel and colon are unremarkable. No acute inflammatory changes, mass lesions or obstructive findings. The terminal ileum is normal. The appendix is normal. Scattered colonic diverticulosis but no findings for acute diverticulitis. Vascular/Lymphatic: Scattered atherosclerotic calcifications but no aneurysm or dissection. The major venous structures are patent. No mesenteric or retroperitoneal mass or adenopathy. Reproductive: The uterus and ovaries are unremarkable. Other: No pelvic mass or adenopathy. No free pelvic fluid collections. No inguinal mass or adenopathy. Small periumbilical abdominal wall hernia containing fat. Musculoskeletal: No significant bony findings. IMPRESSION: 1. No acute abdominal/pelvic findings, mass lesions or adenopathy. 2. Cholelithiasis without findings suspicious for acute cholecystitis. 3. Scattered colonic diverticulosis but no findings for acute diverticulitis. Electronically Signed   By: PMarijo SanesM.D.   On: 05/26/2021 20:30   MR ANGIO HEAD WO CONTRAST  Result Date: 05/26/2021 CLINICAL DATA:  Follow-up stroke. Left-sided weakness. Left MCA occlusion with intervention. EXAM: MRA HEAD WITHOUT CONTRAST TECHNIQUE: Angiographic images of the Circle of Willis were acquired using MRA technique without intravenous contrast. COMPARISON:  MRI earlier same day FINDINGS: Anterior circulation: Both internal carotid arteries are widely patent through the skull base and siphon regions. The anterior and middle cerebral vessels are patent. Reconstituted flow in the left MCA without proximal stenosis or missing distal branch vessels. Posterior circulation: Both vertebral arteries widely patent to the basilar. No basilar stenosis. Stenoses in the PCA P2 segments, worse on the left than the right. Anatomic variants: None significant. Other: None. IMPRESSION: Good appearance of the anterior  circulation flow presently. Status post mechanical thrombectomy on the left. Wide patency of the left MCA branches presently. 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 Electronically Signed   By: MNelson ChimesM.D.   On: 05/26/2021 15:18   MR BRAIN WO CONTRAST  Result Date: 05/26/2021 CLINICAL DATA:  Follow-up stroke. EXAM: MRI HEAD WITHOUT CONTRAST TECHNIQUE: Multiplanar, multiecho pulse sequences of the brain and surrounding structures were obtained without intravenous contrast. COMPARISON:  MR angiography same day. CT studies and intervention done yesterday. FINDINGS: Brain: There is acute infarction affecting the corpus stratum on the left. Minimal punctate infarction in the left posterior frontal subcortical white matter. Swelling of the putamen and caudate, with minimal petechial blood products but without frank hemorrhage. Three punctate foci of acute infarction in the right medial frontal lobe, possibly small embolic infarctions in the right anterior cerebral artery territory. No midline  shift. Elsewhere, there is old cortical and subcortical infarction in the right parietal lobe and moderate chronic small-vessel ischemic changes of the cerebral hemispheric white matter. No hydrocephalus. No extra-axial collection. Vascular: Major vessels at the base of the brain show flow. Skull and upper cervical spine: Negative Sinuses/Orbits: Clear/normal Other: None IMPRESSION: Acute infarction of the left putamen and caudate. Minimal petechial blood products but no frank hematoma. Mild swelling but no midline shift. Three punctate acute infarctions within the medial right frontal lobe, probably micro embolic infarctions in the anterior cerebral artery territory. Old infarction in the right parietal cortical and subcortical brain and seen affecting the cerebral hemispheric deep white matter extensively. Electronically Signed   By: Nelson Chimes M.D.   On: 05/26/2021 15:51   IR CT Head  Ltd  Result Date: 05/25/2021 INDICATION: MALAYSHIA ALL is an 70 year-old female with a past medical history significant for osteoarthritis and cataracts who presented emergently to the ED at [facility] due to right side weakness, with eyes drifting to left, and unable to talk with decreased level of consciousness. NIHSS at presentation was 19; baseline modified Rankin scale 0. Head CT showed no evidence of a large acute infarct or hemorrhage (ASPECTS 10). CTA showed a proximal left M1/MCA occlusion with good collaterals. She receive IV TNK and was then transferred to our service for a diagnostic cerebral angiogram and mechanical thrombectomy. EXAM: ULTRASOUND GUIDED VASCULAR ACCESS DIAGNOSTIC CEREBRAL ANGIOGRAM MECHANICAL THROMBECTOMY FLAT PANEL HEAD CT COMPARISON:  CT/CTA 05/25/2021. MEDICATIONS: No antibiotic administered. ANESTHESIA/SEDATION: The procedure was performed under general anesthesia. CONTRAST:  25 mL of Omnipaque 240 mg/mL FLUOROSCOPY TIME:  Fluoroscopy Time: 4 minutes 36 seconds (420 mGy). COMPLICATIONS: None immediate. TECHNIQUE: Informed written consent was obtained from the patient's husband after a thorough discussion of the procedural risks, benefits and alternatives. All questions were addressed. Maximal Sterile Barrier Technique was utilized including caps, mask, sterile gowns, sterile gloves, sterile drape, hand hygiene and skin antiseptic. A timeout was performed prior to the initiation of the procedure. The right groin was prepped and draped in the usual sterile fashion. Using a micropuncture kit and the modified Seldinger technique, access was gained to the right common femoral artery and an 8 French sheath was placed. Real-time ultrasound guidance was utilized for vascular access including the acquisition of a permanent ultrasound image documenting patency of the accessed vessel. Under fluoroscopy, a Zoom 88 guide catheter was navigated over a 6 [place] Berenstein 2 catheter and a 0.035"  Terumo Glidewire into the aortic arch. The catheter was placed into the left common carotid artery and then advanced into the left internal carotid artery. The Berenstein 2 catheter was removed. Frontal and lateral angiograms of the head were obtained. FINDINGS: 1. Normal caliber of the right common femoral artery. 2. Occlusion of the left MCA at the proximal M1 segment. PROCEDURE: Under biplane roadmap, a zoom 71 aspiration catheter was navigated over an Aristotle 24 microguidewire into the cavernous segment of the left ICA. The aspiration catheter was then advanced to the level of occlusion and connected to an aspiration pump. Continuous aspiration was performed for 2 minutes. The guide catheter was connected to a VacLok syringe and advanced into the M1 segment while the aspiration catheter wasremoved under constant aspiration. The Zoom 88 guide catheter was continuously aspirated until clear blood return was obtained. Left ICA angiograms with frontal and lateral views showed complete left MCA vascular tree recanalization without evidence of thromboembolic complication. Flat panel CT of the head was obtained  and post processed in a separate workstation with concurrent attending physician supervision. Selected images were sent to PACS. No evidence of hemorrhagic complication. A right common femoral artery angiogram was then obtained in right anterior oblique view via sheath side port. The puncture is at the level of the common femoral artery which has normal caliber and contour, adequate for closure device. The femoral sheath was exchanged over the wire for a Perclose ProStyle which was utilized for access closure. Immediate hemostasis was achieved. IMPRESSION: Successful and uncomplicated mechanical thrombectomy performed with direct contact aspiration for treatment of a proximal left M1/MCA occlusion with complete recanalization (TICI3). PLAN: 1. Transfer to ICA for continued care. 2. SBP 120-140 mmHg for 24  hours post procedure. 3. Bed rest for 6 hours. Electronically Signed   By: Pedro Earls M.D.   On: 05/25/2021 17:58   IR US Guide Vasc Access Right  Result Date: 05/25/2021 INDICATION: MARQUITE ATTWOOD is an 70 year-old female with a past medical history significant for osteoarthritis and cataracts who presented emergently to the ED at [facility] due to right side weakness, with eyes drifting to left, and unable to talk with decreased level of consciousness. NIHSS at presentation was 19; baseline modified Rankin scale 0. Head CT showed no evidence of a large acute infarct or hemorrhage (ASPECTS 10). CTA showed a proximal left M1/MCA occlusion with good collaterals. She receive IV TNK and was then transferred to our service for a diagnostic cerebral angiogram and mechanical thrombectomy. EXAM: ULTRASOUND GUIDED VASCULAR ACCESS DIAGNOSTIC CEREBRAL ANGIOGRAM MECHANICAL THROMBECTOMY FLAT PANEL HEAD CT COMPARISON:  CT/CTA 05/25/2021. MEDICATIONS: No antibiotic administered. ANESTHESIA/SEDATION: The procedure was performed under general anesthesia. CONTRAST:  25 mL of Omnipaque 240 mg/mL FLUOROSCOPY TIME:  Fluoroscopy Time: 4 minutes 36 seconds (420 mGy). COMPLICATIONS: None immediate. TECHNIQUE: Informed written consent was obtained from the patient's husband after a thorough discussion of the procedural risks, benefits and alternatives. All questions were addressed. Maximal Sterile Barrier Technique was utilized including caps, mask, sterile gowns, sterile gloves, sterile drape, hand hygiene and skin antiseptic. A timeout was performed prior to the initiation of the procedure. The right groin was prepped and draped in the usual sterile fashion. Using a micropuncture kit and the modified Seldinger technique, access was gained to the right common femoral artery and an 8 French sheath was placed. Real-time ultrasound guidance was utilized for vascular access including the acquisition of a permanent ultrasound  image documenting patency of the accessed vessel. Under fluoroscopy, a Zoom 88 guide catheter was navigated over a 6 [place] Berenstein 2 catheter and a 0.035" Terumo Glidewire into the aortic arch. The catheter was placed into the left common carotid artery and then advanced into the left internal carotid artery. The Berenstein 2 catheter was removed. Frontal and lateral angiograms of the head were obtained. FINDINGS: 1. Normal caliber of the right common femoral artery. 2. Occlusion of the left MCA at the proximal M1 segment. PROCEDURE: Under biplane roadmap, a zoom 71 aspiration catheter was navigated over an Aristotle 24 microguidewire into the cavernous segment of the left ICA. The aspiration catheter was then advanced to the level of occlusion and connected to an aspiration pump. Continuous aspiration was performed for 2 minutes. The guide catheter was connected to a VacLok syringe and advanced into the M1 segment while the aspiration catheter wasremoved under constant aspiration. The Zoom 88 guide catheter was continuously aspirated until clear blood return was obtained. Left ICA angiograms with frontal and lateral views showed  complete left MCA vascular tree recanalization without evidence of thromboembolic complication. Flat panel CT of the head was obtained and post processed in a separate workstation with concurrent attending physician supervision. Selected images were sent to PACS. No evidence of hemorrhagic complication. A right common femoral artery angiogram was then obtained in right anterior oblique view via sheath side port. The puncture is at the level of the common femoral artery which has normal caliber and contour, adequate for closure device. The femoral sheath was exchanged over the wire for a Perclose ProStyle which was utilized for access closure. Immediate hemostasis was achieved. IMPRESSION: Successful and uncomplicated mechanical thrombectomy performed with direct contact aspiration for  treatment of a proximal left M1/MCA occlusion with complete recanalization (TICI3). PLAN: 1. Transfer to ICA for continued care. 2. SBP 120-140 mmHg for 24 hours post procedure. 3. Bed rest for 6 hours. Electronically Signed   By: Pedro Earls M.D.   On: 05/25/2021 17:58   DG CHEST PORT 1 VIEW  Result Date: 05/26/2021 CLINICAL DATA:  Status post intubation. EXAM: PORTABLE CHEST 1 VIEW COMPARISON:  05/25/2021 FINDINGS: The ETT tip is 1.2 cm above the carina. Normal heart size. Interval resolution of previous interstitial and airspace opacities. Remote appearing right posterior rib fractures. IMPRESSION: 1. Stable position of ET tube with tip above the carina. 2. Interval clearing of interstitial edema. Electronically Signed   By: Kerby Moors M.D.   On: 05/26/2021 05:59   DG Chest Portable 1 View  Result Date: 05/25/2021 CLINICAL DATA:  Shortness of breath EXAM: PORTABLE CHEST 1 VIEW COMPARISON:  None. FINDINGS: Endotracheal tube terminates proximally 1.5 cm above the carina. Heart size appears mildly enlarged. Diffuse bilateral interstitial prominence. No pleural effusion or pneumothorax. Age indeterminate fractures of the posterior right seventh and likely eighth ribs. IMPRESSION: 1. Endotracheal tube terminates 1.5 cm above the carina. 2. Diffuse bilateral interstitial prominence may reflect atelectasis, edema, versus atypical/viral infection. 3. Age indeterminate fractures of the posterior right seventh and likely eighth ribs. Electronically Signed   By: Davina Poke D.O.   On: 05/25/2021 16:03   DG Swallowing Func-Speech Pathology  Result Date: 05/27/2021 Table formatting from the original result was not included. Objective Swallowing Evaluation: Type of Study: MBS-Modified Barium Swallow Study  Patient Details Name: NALAYAH HITT MRN: [id number] Date of Birth: September 10, 1950 Today's Date: 05/27/2021 Time: SLP Start Time (ACUTE ONLY): 1305 -SLP Stop Time (ACUTE ONLY): 1315 SLP Time  Calculation (min) (ACUTE ONLY): 10 min Past Medical History: Past Medical History: Diagnosis Date  Allergy   allergic rhinitis  Anxiety   Cataract   removed both eyes   Infertility, female   Osteoarthritis of both knees   OA   PONV (postoperative nausea and vomiting)   Post-menopausal bleeding   neg endo bx. Past Surgical History: Past Surgical History: Procedure Laterality Date  cataract surgery  2011  CESAREAN SECTION    times 2  COLONOSCOPY  2010  hems   ENDOMETRIAL BIOPSY    neg for CA cells  FACIAL COSMETIC SURGERY  2006  face lift  FOOT SURGERY  1998  rt heel spur removal  IR CT HEAD LTD  05/25/2021  IR PERCUTANEOUS ART THROMBECTOMY/INFUSION INTRACRANIAL INC DIAG ANGIO  05/25/2021  IR US GUIDE VASC ACCESS RIGHT  05/25/2021  RADIOLOGY WITH ANESTHESIA N/A 05/25/2021  Procedure: IR WITH ANESTHESIA;  Surgeon: Aletta Edouard, MD;  Location: [place];  Service: Radiology;  Laterality: N/A;  REFRACTIVE SURGERY  08/1999 HPI: 70 y.o. F  with PMH of cataracts, anxiety, osteoarthritis who was at the cancer center with her husband when she started having right-sided weakness with left eye deviation and aphasia. CTA with left M1/MCA occlusion and she was taken emergently to IR for mechanical thrombectomy which resulted in complete recanalization. Intubated 9/20 for procedure and extubated the same day.  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 Oral deficits c/b delayed oral transit, piecemeal swallow with mechanical soft textures, and reduced barium tablet propulsion to pharynx during barium tablet and nectar thick liquids series (pt able to propel tabelt to pharynx after 3 attempts). Pharyngeal deficits marked by reduced timely swallow initiation with thin liquids/ decreased timely epiglottic inversion allowing for pre swallow spillage and laryngeal  penetration and tracheal aspiration of thins via both cup and straw (PAS-7,8). Pt would trigger a delayed throat clear in some instances but overall sensation of aspiration events remained poor and aspirates did not clear trachea. Pt with isolated instance of laryngeal penetration of nectar thick liquids when with barium tablet series only (PAS-3) nectar thick in isolation yielded intact airway protection as well as remaining POs tested (PAS-1). Recommend dysphagia 3 (mechanical soft) and nectar thick liquids with meds in puree and full supervision with POs. SLP to follow up. SLP Visit Diagnosis Dysphagia, oropharyngeal phase (R13.12) Attention and concentration deficit following -- Frontal lobe and executive function deficit following -- Impact on safety and function Moderate aspiration risk;Mild aspiration risk   CHL IP TREATMENT RECOMMENDATION 05/27/2021 Treatment Recommendations Therapy as outlined in treatment plan below   Prognosis 05/27/2021 Prognosis for Safe Diet Advancement Good Barriers to Reach Goals Language deficits;Time post onset Barriers/Prognosis Comment -- CHL IP DIET RECOMMENDATION 05/27/2021 SLP Diet Recommendations Dysphagia 3 (Mech soft) solids;Nectar thick liquid Liquid Administration via Cup;Straw Medication Administration Whole meds with puree Compensations Small sips/bites;Slow rate;Minimize environmental distractions Postural Changes Seated upright at 90 degrees;Remain semi-upright after after feeds/meals (Comment)   CHL IP OTHER RECOMMENDATIONS 05/27/2021 Recommended Consults -- Oral Care Recommendations Oral care BID Other Recommendations Order thickener from pharmacy   CHL IP FOLLOW UP RECOMMENDATIONS 05/27/2021 Follow up Recommendations Other (comment)   CHL IP FREQUENCY AND DURATION 05/27/2021 Speech Therapy Frequency (ACUTE ONLY) min 2x/week Treatment Duration 2 weeks      CHL IP ORAL PHASE 05/27/2021 Oral Phase Impaired Oral - Pudding Teaspoon -- Oral - Pudding Cup -- Oral - Honey  Teaspoon -- Oral - Honey Cup Delayed oral transit Oral - Nectar Teaspoon -- Oral - Nectar Cup Delayed oral transit Oral - Nectar Straw -- Oral - Thin Teaspoon -- Oral - Thin Cup Delayed oral transit Oral - Thin Straw Delayed oral transit Oral - Puree Delayed oral transit Oral - Mech Soft Delayed oral transit;Piecemeal swallowing;Decreased bolus cohesion Oral - Regular -- Oral - Multi-Consistency -- Oral - Pill Delayed oral transit;Reduced posterior propulsion;Holding of bolus Oral Phase - Comment --  CHL IP PHARYNGEAL PHASE 05/27/2021 Pharyngeal Phase Impaired Pharyngeal- Pudding Teaspoon -- Pharyngeal -- Pharyngeal- Pudding Cup -- Pharyngeal -- Pharyngeal- Honey Teaspoon -- Pharyngeal -- Pharyngeal- Honey Cup Delayed swallow initiation-vallecula;Reduced tongue base retraction;Pharyngeal residue - valleculae Pharyngeal -- Pharyngeal- Nectar Teaspoon -- Pharyngeal -- Pharyngeal- Nectar Cup Delayed swallow initiation-vallecula;Pharyngeal residue - valleculae Pharyngeal -- Pharyngeal- Nectar Straw -- Pharyngeal -- Pharyngeal- Thin Teaspoon -- Pharyngeal -- Pharyngeal- Thin Cup Delayed swallow initiation-pyriform sinuses;Penetration/Aspiration before swallow;Reduced airway/laryngeal closure;Reduced anterior laryngeal  mobility;Reduced epiglottic inversion Pharyngeal Material enters airway, passes BELOW cords without attempt by patient to eject out (silent aspiration);Material enters airway, passes BELOW cords and not ejected out despite cough attempt by patient Pharyngeal- Thin Straw Delayed swallow initiation-pyriform sinuses;Reduced airway/laryngeal closure;Penetration/Aspiration before swallow Pharyngeal Material enters airway, passes BELOW cords without attempt by patient to eject out (silent aspiration);Material enters airway, passes BELOW cords and not ejected out despite cough attempt by patient Pharyngeal- Puree Delayed swallow initiation-vallecula Pharyngeal -- Pharyngeal- Mechanical Soft Delayed swallow  initiation-vallecula;Reduced tongue base retraction;Pharyngeal residue - valleculae Pharyngeal -- Pharyngeal- Regular -- Pharyngeal -- Pharyngeal- Multi-consistency -- Pharyngeal -- Pharyngeal- Pill Reduced airway/laryngeal closure;Penetration/Aspiration before swallow;Penetration/Aspiration during swallow;Pharyngeal residue - valleculae Pharyngeal Material enters airway, remains ABOVE vocal cords and not ejected out Pharyngeal Comment --  CHL IP CERVICAL ESOPHAGEAL PHASE 05/27/2021 Cervical Esophageal Phase WFL Pudding Teaspoon -- Pudding Cup -- Honey Teaspoon -- Honey Cup -- Nectar Teaspoon -- Nectar Cup -- Nectar Straw -- Thin Teaspoon -- Thin Cup -- Thin Straw -- Puree -- Mechanical Soft -- Regular -- Multi-consistency -- Pill -- Cervical Esophageal Comment -- Chelsea E Hartness MA, CCC-SLP 05/27/2021, 2:16 PM              ECHOCARDIOGRAM COMPLETE  Result Date: 05/26/2021    ECHOCARDIOGRAM REPORT   Patient Name:   MONTIA HASLIP Date of Exam: 05/26/2021 Medical Rec #:  [id number]     Height:       64.0 in Accession #:    [id number]    Weight:       211.6 lb Date of Birth:  1951-08-31      BSA:          2.004 m Patient Age:    57 years      BP:           129/61 mmHg Patient Gender: F             HR:           68 bpm. Exam Location:  Inpatient Procedure: 2D Echo, Cardiac Doppler, Color Doppler, Intracardiac Opacification            Agent and 3D Echo Indications:    Stroke I63.9  History:        Patient has no prior history of Echocardiogram examinations.                 Risk Factors:Hypertension. Acute respiratory insufficiency                 secondary to acute encephalopathy in the setting of CVA. Acute                 left MCA stroke s/p TNK and mechanical thrombectomy with                 complete revascularization. New diagnosis of atrial                 fibrillation, patient has converted to sinus rhythm.  Sonographer:    Darlina Sicilian RDCS Referring Phys: [id number] [place]  1. Left  ventricular ejection fraction, by estimation, is 50 to 55%. The left ventricle has low normal function. The left ventricle demonstrates regional wall motion abnormalities - mild septal hypokinesis. There is mild left ventricular hypertrophy of the septal segment. Left ventricular diastolic parameters are indeterminate.  2. Right ventricular systolic function is normal. The right ventricular size is normal. There is normal pulmonary artery systolic pressure. The estimated right  ventricular systolic pressure is 42.7 mmHg.  3. The mitral valve is normal in structure. Trivial mitral valve regurgitation. No evidence of mitral stenosis.  4. Tricuspid valve regurgitation is mild to moderate.  5. The aortic valve is grossly normal. Aortic valve regurgitation is not visualized. No aortic stenosis is present.  6. The inferior vena cava is normal in size with <50% respiratory variability, suggesting right atrial pressure of 8 mmHg. FINDINGS  Left Ventricle: Left ventricular ejection fraction, by estimation, is 50 to 55%. The left ventricle has low normal function. The left ventricle demonstrates regional wall motion abnormalities. 3D left ventricular ejection fraction analysis performed but  not reported based on interpreter judgement due to suboptimal quality. The left ventricular internal cavity size was normal in size. There is mild left ventricular hypertrophy of the septal segment. Left ventricular diastolic parameters are indeterminate. Right Ventricle: The right ventricular size is normal. Right vetricular wall thickness was not well visualized. Right ventricular systolic function is normal. There is normal pulmonary artery systolic pressure. The tricuspid regurgitant velocity is 2.31 m/s, and with an assumed right atrial pressure of 8 mmHg, the estimated right ventricular systolic pressure is 06.2 mmHg. Left Atrium: Left atrial size was normal in size. Right Atrium: Right atrial size was normal in size. Pericardium:  There is no evidence of pericardial effusion. Mitral Valve: The mitral valve is normal in structure. Mild mitral annular calcification. Trivial mitral valve regurgitation. No evidence of mitral valve stenosis. Tricuspid Valve: The tricuspid valve is normal in structure. Tricuspid valve regurgitation is mild to moderate. No evidence of tricuspid stenosis. Aortic Valve: The aortic valve is grossly normal. Aortic valve regurgitation is not visualized. No aortic stenosis is present. Pulmonic Valve: The pulmonic valve was not well visualized. Pulmonic valve regurgitation is trivial. No evidence of pulmonic stenosis. Aorta: The aortic root is normal in size and structure. Venous: The inferior vena cava is normal in size with less than 50% respiratory variability, suggesting right atrial pressure of 8 mmHg. IAS/Shunts: No atrial level shunt detected by color flow Doppler.  LEFT VENTRICLE PLAX 2D LVIDd:         4.70 cm     Diastology LVIDs:         3.50 cm     LV e' medial:    10.10 cm/s LV PW:         0.80 cm     LV E/e' medial:  7.6 LV IVS:        1.10 cm     LV e' lateral:   9.87 cm/s LVOT diam:     2.00 cm     LV E/e' lateral: 7.7 LV SV:         44 LV SV Index:   22 LVOT Area:     3.14 cm  LV Volumes (MOD) LV vol d, MOD A2C: 76.2 ml LV vol d, MOD A4C: 57.2 ml LV vol s, MOD A2C: 20.0 ml LV vol s, MOD A4C: 12.7 ml LV SV MOD A2C:     56.2 ml LV SV MOD A4C:     57.2 ml LV SV MOD BP:      53.3 ml RIGHT VENTRICLE RV S prime:     12.30 cm/s TAPSE (M-mode): 1.7 cm LEFT ATRIUM             Index       RIGHT ATRIUM           Index LA diam:  4.00 cm 2.00 cm/m  RA Area:     12.10 cm LA Vol (A2C):   47.0 ml 23.45 ml/m RA Volume:   23.00 ml  11.48 ml/m LA Vol (A4C):   64.2 ml 32.04 ml/m LA Biplane Vol: 54.5 ml 27.20 ml/m  AORTIC VALVE LVOT Vmax:   71.68 cm/s LVOT Vmean:  45.300 cm/s LVOT VTI:    0.138 m  AORTA Ao Root diam: 3.00 cm Ao Asc diam:  2.80 cm MITRAL VALVE               TRICUSPID VALVE MV Area (PHT): 3.34 cm     TR Peak grad:   21.3 mmHg MV Decel Time: 227 msec    TR Vmax:        231.00 cm/s MV E velocity: 76.47 cm/s                            SHUNTS                            Systemic VTI:  0.14 m                            Systemic Diam: 2.00 cm Cherlynn Kaiser MD Electronically signed by Cherlynn Kaiser MD Signature Date/Time: 05/26/2021/5:49:00 PM    Final    IR PERCUTANEOUS ART THROMBECTOMY/INFUSION INTRACRANIAL INC DIAG ANGIO  Result Date: 05/25/2021 INDICATION: KAIYAH EBER is an 70 year-old female with a past medical history significant for osteoarthritis and cataracts who presented emergently to the ED at [facility] due to right side weakness, with eyes drifting to left, and unable to talk with decreased level of consciousness. NIHSS at presentation was 19; baseline modified Rankin scale 0. Head CT showed no evidence of a large acute infarct or hemorrhage (ASPECTS 10). CTA showed a proximal left M1/MCA occlusion with good collaterals. She receive IV TNK and was then transferred to our service for a diagnostic cerebral angiogram and mechanical thrombectomy. EXAM: ULTRASOUND GUIDED VASCULAR ACCESS DIAGNOSTIC CEREBRAL ANGIOGRAM MECHANICAL THROMBECTOMY FLAT PANEL HEAD CT COMPARISON:  CT/CTA 05/25/2021. MEDICATIONS: No antibiotic administered. ANESTHESIA/SEDATION: The procedure was performed under general anesthesia. CONTRAST:  25 mL of Omnipaque 240 mg/mL FLUOROSCOPY TIME:  Fluoroscopy Time: 4 minutes 36 seconds (420 mGy). COMPLICATIONS: None immediate. TECHNIQUE: Informed written consent was obtained from the patient's husband after a thorough discussion of the procedural risks, benefits and alternatives. All questions were addressed. Maximal Sterile Barrier Technique was utilized including caps, mask, sterile gowns, sterile gloves, sterile drape, hand hygiene and skin antiseptic. A timeout was performed prior to the initiation of the procedure. The right groin was prepped and draped in the usual sterile fashion. Using  a micropuncture kit and the modified Seldinger technique, access was gained to the right common femoral artery and an 8 French sheath was placed. Real-time ultrasound guidance was utilized for vascular access including the acquisition of a permanent ultrasound image documenting patency of the accessed vessel. Under fluoroscopy, a Zoom 88 guide catheter was navigated over a 6 [place] Berenstein 2 catheter and a 0.035" Terumo Glidewire into the aortic arch. The catheter was placed into the left common carotid artery and then advanced into the left internal carotid artery. The Berenstein 2 catheter was removed. Frontal and lateral angiograms of the head were obtained. FINDINGS: 1. Normal caliber of the right common femoral artery. 2. Occlusion  of the left MCA at the proximal M1 segment. PROCEDURE: Under biplane roadmap, a zoom 71 aspiration catheter was navigated over an Aristotle 24 microguidewire into the cavernous segment of the left ICA. The aspiration catheter was then advanced to the level of occlusion and connected to an aspiration pump. Continuous aspiration was performed for 2 minutes. The guide catheter was connected to a VacLok syringe and advanced into the M1 segment while the aspiration catheter wasremoved under constant aspiration. The Zoom 88 guide catheter was continuously aspirated until clear blood return was obtained. Left ICA angiograms with frontal and lateral views showed complete left MCA vascular tree recanalization without evidence of thromboembolic complication. Flat panel CT of the head was obtained and post processed in a separate workstation with concurrent attending physician supervision. Selected images were sent to PACS. No evidence of hemorrhagic complication. A right common femoral artery angiogram was then obtained in right anterior oblique view via sheath side port. The puncture is at the level of the common femoral artery which has normal caliber and contour, adequate for closure  device. The femoral sheath was exchanged over the wire for a Perclose ProStyle which was utilized for access closure. Immediate hemostasis was achieved. IMPRESSION: Successful and uncomplicated mechanical thrombectomy performed with direct contact aspiration for treatment of a proximal left M1/MCA occlusion with complete recanalization (TICI3). PLAN: 1. Transfer to ICA for continued care. 2. SBP 120-140 mmHg for 24 hours post procedure. 3. Bed rest for 6 hours. Electronically Signed   By: Pedro Earls M.D.   On: 05/25/2021 17:58   CT HEAD CODE STROKE WO CONTRAST  Result Date: 05/25/2021 CLINICAL DATA:  Left-sided weakness EXAM: CT ANGIOGRAPHY HEAD AND NECK TECHNIQUE: Multidetector CT imaging of the head and neck was performed using the standard protocol during bolus administration of intravenous contrast. Multiplanar CT image reconstructions and MIPs were obtained to evaluate the vascular anatomy. Carotid stenosis measurements (when applicable) are obtained utilizing NASCET criteria, using the distal internal carotid diameter as the denominator. CONTRAST:  6m OMNIPAQUE IOHEXOL 350 MG/ML SOLN COMPARISON:  None. FINDINGS: CT HEAD FINDINGS Brain: There is ill-defined hypodensity in the left centrum semiovale. The left basal ganglia and insular cortex are preserved. The cortex throughout the left MCA distribution is preserved. There is focal hypodensity in the right parietal lobe likely reflecting remote infarct. Additional foci of hypodensity in the bilateral cerebral hemispheres are nonspecific but may reflect sequela of chronic white matter microangiopathy. There is no evidence of acute intracranial hemorrhage or extra-axial fluid collection. The ventricles are not enlarged. There is no mass lesion. There is no midline shift. Vascular: There is a dense MCA on the left. See below. Skull: Normal. Negative for fracture or focal lesion. Sinuses/orbits: The imaged paranasal sinuses are clear. The  mastoid air cells are clear. Bilateral lens implants are in place. The globes and orbits are otherwise unremarkable. Review of the MIP images confirms the above findings CTA NECK FINDINGS The CTA images are moderately degraded by motion artifact. Aortic arch: Standard branching. Imaged portion shows no evidence of aneurysm or dissection. No significant stenosis of the major arch vessel origins. Right carotid system: The right carotid system is suboptimally evaluated particularly in the region of the carotid bulb due to motion artifact. The right internal carotid artery has a medialized course. Otherwise, there is no definite focal stenosis, occlusion, dissection, or aneurysm. Left carotid system: The left carotid system is suboptimally evaluated due to motion artifact particularly in the region of the carotid  bulb. There is calcified atherosclerotic plaque of the left carotid bulb without definite hemodynamically significant stenosis or occlusion. There is no definite dissection or aneurysm. Vertebral arteries: The vertebral arteries appear patent, without definite focal stenosis, dissection, occlusion, or aneurysm. Skeleton: There is mild multilevel degenerative change of the cervical spine. There is no acute osseous abnormality or aggressive osseous lesion. Other neck: The soft tissues are unremarkable. Upper chest: There is a 1.1 cm x 1.0 cm soft tissue nodule in the medial right apex. Review of the MIP images confirms the above findings CTA HEAD FINDINGS Anterior circulation: There is calcification of the bilateral cavernous ICAs without focal stenosis or occlusion. There is occlusion of the proximal left M1 segment with reconstitution of flow at the M2 segments. There appears to be good collateral flow throughout the peripheral MCA distribution. The right MCA is patent. The bilateral ACAs are patent. The right A1 segment is diminutive. There is no aneurysm. Posterior circulation: The posterior circulation is  suboptimally evaluated due to motion artifact. The V4 segments of the vertebral arteries are patent. The basilar artery is patent. There is multifocal moderate stenosis of the left P2 segment (9-256, 10-116, 10-123). The right PCA is patent.  There is no aneurysm. Venous sinuses: As permitted by contrast timing, patent. Anatomic variants: None. Review of the MIP images confirms the above findings IMPRESSION: 1. Occlusion of the left M1 segment with reconstitution of flow at the M2 segments with good collateral flow throughout the remainder of the MCA distribution. 2. No evidence of left MCA territory infarct at the time of imaging. Aspects is 10. 3. The CTA images are moderately motion degraded. There is no definite high-grade stenosis in the neck. 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 4. 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 Other foci of hypodensity in the supratentorial white matter likely reflects sequela of chronic white matter microangiopathy. 5. 1.1 cm soft tissue nodule in the medial right apex. Consider one of the following in 3 months for both low-risk and high-risk individuals: (a) repeat chest CT, (b) follow-up PET-CT, or (c) tissue sampling. This recommendation follows the consensus statement: Guidelines for Management of Incidental Pulmonary Nodules Detected on CT Images: From the Fleischner Society 2017; Radiology 2017; 284:228-243. The acute results were called by telephone at the time of interpretation on 05/25/2021 at 3:05 pm to provider Dr Cheral Marker , who verbally acknowledged these results. Electronically Signed   By: Valetta Mole M.D.   On: 05/25/2021 15:22   CT ANGIO HEAD NECK W WO CM (CODE STROKE)  Result Date: 05/25/2021 CLINICAL DATA:  Left-sided weakness EXAM: CT ANGIOGRAPHY HEAD AND NECK TECHNIQUE: Multidetector CT imaging of the head and neck was performed using the standard protocol during bolus administration of intravenous contrast.  Multiplanar CT image reconstructions and MIPs were obtained to evaluate the vascular anatomy. Carotid stenosis measurements (when applicable) are obtained utilizing NASCET criteria, using the distal internal carotid diameter as the denominator. CONTRAST:  45m OMNIPAQUE IOHEXOL 350 MG/ML SOLN COMPARISON:  None. FINDINGS: CT HEAD FINDINGS Brain: There is ill-defined hypodensity in the left centrum semiovale. The left basal ganglia and insular cortex are preserved. The cortex throughout the left MCA distribution is preserved. There is focal hypodensity in the right parietal lobe likely reflecting remote infarct. Additional foci of hypodensity in the bilateral cerebral hemispheres are nonspecific but may reflect sequela of chronic white matter microangiopathy. There is no evidence of acute intracranial hemorrhage or extra-axial fluid collection.  The ventricles are not enlarged. There is no mass lesion. There is no midline shift. Vascular: There is a dense MCA on the left. See below. Skull: Normal. Negative for fracture or focal lesion. Sinuses/orbits: The imaged paranasal sinuses are clear. The mastoid air cells are clear. Bilateral lens implants are in place. The globes and orbits are otherwise unremarkable. Review of the MIP images confirms the above findings CTA NECK FINDINGS The CTA images are moderately degraded by motion artifact. Aortic arch: Standard branching. Imaged portion shows no evidence of aneurysm or dissection. No significant stenosis of the major arch vessel origins. Right carotid system: The right carotid system is suboptimally evaluated particularly in the region of the carotid bulb due to motion artifact. The right internal carotid artery has a medialized course. Otherwise, there is no definite focal stenosis, occlusion, dissection, or aneurysm. Left carotid system: The left carotid system is suboptimally evaluated due to motion artifact particularly in the region of the carotid bulb. There is  calcified atherosclerotic plaque of the left carotid bulb without definite hemodynamically significant stenosis or occlusion. There is no definite dissection or aneurysm. Vertebral arteries: The vertebral arteries appear patent, without definite focal stenosis, dissection, occlusion, or aneurysm. Skeleton: There is mild multilevel degenerative change of the cervical spine. There is no acute osseous abnormality or aggressive osseous lesion. Other neck: The soft tissues are unremarkable. Upper chest: There is a 1.1 cm x 1.0 cm soft tissue nodule in the medial right apex. Review of the MIP images confirms the above findings CTA HEAD FINDINGS Anterior circulation: There is calcification of the bilateral cavernous ICAs without focal stenosis or occlusion. There is occlusion of the proximal left M1 segment with reconstitution of flow at the M2 segments. There appears to be good collateral flow throughout the peripheral MCA distribution. The right MCA is patent. The bilateral ACAs are patent. The right A1 segment is diminutive. There is no aneurysm. Posterior circulation: The posterior circulation is suboptimally evaluated due to motion artifact. The V4 segments of the vertebral arteries are patent. The basilar artery is patent. There is multifocal moderate stenosis of the left P2 segment (9-256, 10-116, 10-123). The right PCA is patent.  There is no aneurysm. Venous sinuses: As permitted by contrast timing, patent. Anatomic variants: None. Review of the MIP images confirms the above findings IMPRESSION: 1. Occlusion of the left M1 segment with reconstitution of flow at the M2 segments with good collateral flow throughout the remainder of the MCA distribution. 2. No evidence of left MCA territory infarct at the time of imaging. Aspects is 10. 3. The CTA images are moderately motion degraded. There is no definite high-grade stenosis in the neck. 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 4. Small  remote infarct in the right parietal lobe. Other foci of hypodensity in the supratentorial white matter likely reflects sequela of chronic white matter microangiopathy. 5. 1.1 cm soft tissue nodule in the medial right apex. Consider one of the following in 3 months for both low-risk and high-risk individuals: (a) repeat chest CT, (b) follow-up PET-CT, or (c) tissue sampling. This recommendation follows the consensus statement: Guidelines for Management of Incidental Pulmonary Nodules Detected on CT Images: From the Fleischner Society 2017; Radiology 2017; 284:228-243. The acute results were called by telephone at the time of interpretation on 05/25/2021 at 3:05 pm to provider Dr Cheral Marker , who verbally acknowledged these results. Electronically Signed   By: Valetta Mole M.D.   On: 05/25/2021 15:22  PHYSICAL EXAM  Temp:  [97.7 F (36.5 C)-99 F (37.2 C)] 99 F (37.2 C) (09/25 0721) Pulse Rate:  [59-77] 77 (09/25 0721) Resp:  [15-19] 15 (09/25 0721) BP: (112-159)/(56-97) 114/56 (09/25 0721) SpO2:  [94 %-99 %] 94 % (09/25 0721)  General - Well nourished, well developed, elderly Caucasian lady in no apparent distress.  Ophthalmologic - fundi not visualized due to noncooperation.  Cardiovascular - irregularly irregular heart rate and rhythm.  Neuro - awake, alert, eyes open, orientated to place, age, months and people, but not to year.  Mild expressive aphasia occasional word finding difficulty and paraphasic errors, however, following all simple commands. Able to repeat and naming 2/4.  Mild dysarthria.  No gaze palsy, tracking bilaterally, blinking to visual threat bilaterally, PERRL.  Mild right facial droop. Tongue midline.  Left upper extremity 4/5 and right upper extremity no drift 4-/5.  Bilateral lower extremity proximal 3+/5 proximal and 4/5 distal.  Sensation symmetrical bilaterally, b/l FTN intact although slow on the right, gait not tested.    ASSESSMENT/PLAN Ms. ADI SEALES is a 70  y.o. female with history of anxiety and HLD admitted for right-sided weakness, left gaze, aphasia and altered mental status. TNK given.    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 fib CT head no acute abnormality, old right parietal small infarct CT head and neck left M1 occlusion left M2 reconstitution.  Left P2 stenosis MRI left MCA infarcts more prominent at left BG and caudate head, right MCA and ACA punctate infarcts MRA left MCA patent now 2D Echo EF 50 to 55%, left atrial size normal LDL 109 HgbA1c 5.8 SCDs for VTE prophylaxis No antithrombotic prior to admission, now on Eliquis Ongoing aggressive stroke risk factor management Therapy recommendations: CIR Disposition: Pending  A. Fib New diagnosis EKG confirmed Cardiology consulted, appreciate assistance Rate controlled Eliquis started 05/29/2021 Hypotension, improved BP stable S/p bolus and one dose of albumin  BP < 180/105 Long term BP goal normotensive  Hyperlipidemia Home meds: None LDL 109, goal < 70 On Lipitor 40 Continue statin at discharge  Dysphagia Did not pass bedside swallow N.p.o. with sip for meds Pending MBS Speech on board  Other Stroke Risk Factors Advanced age Obesity, Body mass index is 36.33 kg/m.  Hx stroke/TIA on imaging  Other Active Problems CT head and neck showed right lung apex 1cm soft tissue nodule - need outpatient follow-up  Hospital day # 5 Patient seems to be doing well and has made significant improvement.  Continue Eliquis for secondary stroke prevention for atrial fibrillation.  Continue ongoing therapies.  Hopefully transfer to rehab over the next few days.  Long discussion with patient and her friend at the bedside and answered questions.  Greater than 50% time during the 25-minute visit was spent on counseling and coordination of care and discussion  with patient and family about embolic stroke and atrial fibrillation and risk-benefit of anticoagulation and answering questions. Antony Contras, MD Stroke Neurology 05/30/2021 12:28 PM    To contact Stroke Continuity provider, please refer to [email or web address] After hours, contact General Neurology

## 2021-05-31 ENCOUNTER — Other Ambulatory Visit (HOSPITAL_COMMUNITY): Payer: Self-pay

## 2021-05-31 DIAGNOSIS — R0902 Hypoxemia: Secondary | ICD-10-CM | POA: Diagnosis not present

## 2021-05-31 DIAGNOSIS — I4819 Other persistent atrial fibrillation: Secondary | ICD-10-CM | POA: Diagnosis not present

## 2021-05-31 DIAGNOSIS — I639 Cerebral infarction, unspecified: Secondary | ICD-10-CM

## 2021-05-31 DIAGNOSIS — I63412 Cerebral infarction due to embolism of left middle cerebral artery: Secondary | ICD-10-CM | POA: Diagnosis not present

## 2021-05-31 LAB — GLUCOSE, CAPILLARY
Glucose-Capillary: 107 mg/dL — ABNORMAL HIGH (ref 70–99)
Glucose-Capillary: 113 mg/dL — ABNORMAL HIGH (ref 70–99)
Glucose-Capillary: 120 mg/dL — ABNORMAL HIGH (ref 70–99)
Glucose-Capillary: 122 mg/dL — ABNORMAL HIGH (ref 70–99)
Glucose-Capillary: 155 mg/dL — ABNORMAL HIGH (ref 70–99)
Glucose-Capillary: 88 mg/dL (ref 70–99)
Glucose-Capillary: 95 mg/dL (ref 70–99)

## 2021-05-31 LAB — BASIC METABOLIC PANEL
Anion gap: 6 (ref 5–15)
BUN: 12 mg/dL (ref 8–23)
CO2: 27 mmol/L (ref 22–32)
Calcium: 9.3 mg/dL (ref 8.9–10.3)
Chloride: 106 mmol/L (ref 98–111)
Creatinine, Ser: 0.77 mg/dL (ref 0.44–1.00)
GFR, Estimated: >60 mL/min (ref >60)
Glucose, Bld: 106 mg/dL — ABNORMAL HIGH (ref 70–99)
Potassium: 3.7 mmol/L (ref 3.5–5.1)
Sodium: 139 mmol/L (ref 135–145)

## 2021-05-31 LAB — CBC
HCT: 35.9 % — ABNORMAL LOW (ref 36.0–46.0)
Hemoglobin: 12.6 g/dL (ref 12.0–15.0)
MCH: 32 pg (ref 26.0–34.0)
MCHC: 35.1 g/dL (ref 30.0–36.0)
MCV: 91.1 fL (ref 80.0–100.0)
Platelets: 210 10*3/uL (ref 150–400)
RBC: 3.94 MIL/uL (ref 3.87–5.11)
RDW: 11.9 % (ref 11.5–15.5)
WBC: 9.3 10*3/uL (ref 4.0–10.5)
nRBC: 0 % (ref 0.0–0.2)

## 2021-05-31 MED ORDER — METOPROLOL SUCCINATE ER 25 MG PO TB24
25.0000 mg | ORAL_TABLET | Freq: Every day | ORAL | Status: DC
  Administered 2021-05-31 – 2021-06-01 (×2): 25 mg via ORAL
  Filled 2021-05-31 (×2): qty 1

## 2021-05-31 MED ORDER — POTASSIUM CHLORIDE CRYS ER 20 MEQ PO TBCR
20.0000 meq | EXTENDED_RELEASE_TABLET | Freq: Once | ORAL | Status: AC
  Administered 2021-05-31: 20 meq via ORAL
  Filled 2021-05-31: qty 1

## 2021-05-31 NOTE — Progress Notes (Signed)
Physical Therapy Treatment Patient Details Name: Deborah Carey MRN: [id number] DOB: 1951/06/18 Today's Date: 05/31/2021   History of Present Illness 70 y.o. female presents to [facility] hospital on 05/25/2021 with R sided weakness, L eye deviation and aphasia. Pt received TKN and was intubated due to worsening mental status. CTA demonstrates L M1/MCA occlusion, pt taken to IR for mechanical thrombectomy with complete revascularization. PMH includes osteoarthritis, anxiety, and cataracts.    PT Comments    Pt progressing towards physical therapy goals. Scored 18/24 on the DGI this session, indicating she is at increased risk for falls compared to other community dwelling older adults. Pt with continued cognitive deficits - she is very flat and with short term memory deficits. Despite this, she managed to direction find fairly well in the hallway. Overall, feel pt still needs hands on assist throughout OOB mobility, especially without the RW. Would like to add a cognitive task during gait training next session to see how gait and balance are effected. Will continue to follow and progress as able per POC.     Recommendations for follow up therapy are one component of a multi-disciplinary discharge planning process, led by the attending physician.  Recommendations may be updated based on patient status, additional functional criteria and insurance authorization.  Follow Up Recommendations  CIR     Equipment Recommendations  Rolling walker with 5" wheels    Recommendations for Other Services Rehab consult     Precautions / Restrictions Precautions Precautions: Fall Precaution Comments: right sided weakness Restrictions Weight Bearing Restrictions: No     Mobility  Bed Mobility Overal bed mobility: Modified Independent             General bed mobility comments: Increased time but pt was able to manage exit and entrance without difficulty.    Transfers Overall transfer level: Needs  assistance Equipment used: None Transfers: Sit to/from Stand Sit to Stand: Supervision         General transfer comment: Pt demonstrated proper hand placement on seated surface for safety. No assist required however supervision provided for safety.  Ambulation/Gait Ambulation/Gait assistance: Min guard Gait Distance (Feet): 200 Feet Assistive device: Rolling walker (2 wheeled) Gait Pattern/deviations: Step-through pattern;Decreased step length - right;Decreased dorsiflexion - right Gait velocity: Decreased Gait velocity interpretation: 1.31 - 2.62 ft/sec, indicative of limited community ambulator General Gait Details: Decreased foot clearance on the R but overall good navigating in the hallway. She was able to direction find despite several turns in the unit.   Stairs Stairs: Yes Stairs assistance: Min guard Stair Management: Two rails;Alternating pattern;Forwards Number of Stairs: 5 General stair comments: Practice stairs in the rehab gym as part of the DGI   Wheelchair Mobility    Modified Rankin (Stroke Patients Only) Modified Rankin (Stroke Patients Only) Pre-Morbid Rankin Score: No symptoms Modified Rankin: Moderately severe disability     Balance Overall balance assessment: Needs assistance Sitting-balance support: Feet supported Sitting balance-Leahy Scale: Good Sitting balance - Comments: sitting EOB without LOB donned second gown and fixing hair with A   Standing balance support: Single extremity supported;During functional activity Standing balance-Leahy Scale: Fair                   Standardized Balance Assessment Standardized Balance Assessment : Dynamic Gait Index   Dynamic Gait Index Level Surface: Mild Impairment Change in Gait Speed: Mild Impairment Gait with Horizontal Head Turns: Normal Gait with Vertical Head Turns: Mild Impairment Gait and Pivot Turn: Normal Step Over Obstacle:  Mild Impairment Step Around Obstacles: Mild  Impairment Steps: Mild Impairment Total Score: 18      Cognition Arousal/Alertness: Awake/alert Behavior During Therapy: Flat affect Overall Cognitive Status: Impaired/Different from baseline Area of Impairment: Memory                     Memory: Decreased short-term memory         General Comments: Cognition improved overall however still with flat affect. Pt went to the sink to wash face, however once there she washed her hands instead and moved on to ambulating in the hallway without washing face.      Exercises      General Comments General comments (skin integrity, edema, etc.): Pt's husband inquired about therapies post CIR; he also inquired about pt's diet, stating the pt has been drinking thin liquids today. I educated pt and her husband about current nectar thick diet orders, RN and SLP also notified      Pertinent Vitals/Pain Pain Assessment: No/denies pain Pain Intervention(s): Monitored during session    Home Living                      Prior Function            PT Goals (current goals can now be found in the care plan section) Acute Rehab PT Goals Patient Stated Goal: get home soon PT Goal Formulation: With patient Time For Goal Achievement: 06/09/21 Potential to Achieve Goals: Good Progress towards PT goals: Progressing toward goals    Frequency    Min 4X/week      PT Plan Current plan remains appropriate    Co-evaluation              AM-PAC PT "6 Clicks" Mobility   Outcome Measure  Help needed turning from your back to your side while in a flat bed without using bedrails?: A Little Help needed moving from lying on your back to sitting on the side of a flat bed without using bedrails?: A Little Help needed moving to and from a bed to a chair (including a wheelchair)?: A Little Help needed standing up from a chair using your arms (e.g., wheelchair or bedside chair)?: A Little Help needed to walk in hospital room?: A  Little Help needed climbing 3-5 steps with a railing? : A Lot 6 Click Score: 17    End of Session Equipment Utilized During Treatment: Gait belt Activity Tolerance: Patient tolerated treatment well Patient left: in chair;with call bell/phone within reach;with chair alarm set;with family/visitor present Nurse Communication: Mobility status PT Visit Diagnosis: Other abnormalities of gait and mobility (R26.89);Muscle weakness (generalized) (M62.81);Hemiplegia and hemiparesis Hemiplegia - Right/Left: Right Hemiplegia - caused by: Cerebral infarction     Time: 6606-3016 PT Time Calculation (min) (ACUTE ONLY): 17 min  Charges:  $Gait Training: 8-22 mins                     Rolinda Roan, PT, DPT Acute Rehabilitation Services Pager: [phone] Office: [phone]    Deborah Carey 05/31/2021, 5:49 PM

## 2021-05-31 NOTE — Discharge Instructions (Signed)

## 2021-05-31 NOTE — TOC Benefit Eligibility Note (Signed)
Patient Advocate Encounter   Insurance verification completed.     The patient is currently admitted and upon discharge could be taking Eliquis 5 mg.   The current 30 day co-pay is, $40.00.    The patient is insured through [employer] Medicare Part D        [name], CPhT Pharmacy Patient Advocate Specialist [facility] Team Direct Number: [phone]  Fax: [phone]   

## 2021-05-31 NOTE — PMR Pre-admission (Signed)
PMR Admission Coordinator Pre-Admission Assessment  Patient: Deborah Carey is an 70 y.o., female MRN: [id number] DOB: [birth year] Height: 5' 4"  (162.6 cm) Weight: 96 kg  Insurance Information HMO:     PPO: yes     PCP:      IPA:      80/20:      OTHER:  PRIMARY: [employer]      Policy#: [id number]      Subscriber: pt CM Name: Maudie Mercury      Phone#: [phone] ext [phone]     Fax#: [phone] Pre-Cert#: [id number] [place] for CIR given by Maudie Mercury with [employer] with updates due to Fraser Din E at fax listed above on 10/3 Fraser Din ext [phone])      Employer:  Benefits:  Phone #: [phone]     Name:  Irene Shipper. Date: 09/06/19     Deduct: $0      Out of Pocket Max: $4000 ($0 met)      Life Max:  CIR: $160/day for days 1-10      SNF: 100% Outpatient:      Co-Pay: $20 Home Health: 100%      Co-Pay:  DME: 80%     Co-Ins: 20% Providers:  SECONDARY:       Policy#:      Phone#:   Development worker, community:       Phone#:   The Therapist, art Information Summary" for patients in Inpatient Rehabilitation Facilities with attached "Privacy Act [place] Records" was provided and verbally reviewed with: Pt/family  Emergency Contact Information Contact Information     Name Relation Home Work Mobile   Seth,Mike Spouse [phone]  [phone]   Malva Limes Daughter   [phone]       Current Medical History  Patient Admitting Diagnosis: CVA (L MCA, R MCA/ACA)  History of Present Illness: Pt is a 70 y/o female with PMH of OA, anxiety, and cataracts who was admitted to [facility] from [facility] on 9/20 for R sided weakness, L gaze, and acute onset of aphasia.  CT at [facility] revealed occlusion of L M1 MCA segment with a large territoy L MCA ischemic infarction.  TNK was adminitstered at [facility] and she was transferred to [facility] for IR intervention.  Prior to transfer she developed acute respiratory failure requiring intubation in the ED.  On arrival to [facility], pt was intubated and sedated.  She was found to have afib with  HR in the 160s on monitor.  No known history of afib.  She was extubated on 9/21.  Cleviprex for HTN initially.  2D echo normal, A1c 5.8, MRI revealed L MCA infarct, R MCA and ACA punctate infarcts.  Stroke service recommends Eliquis for stroke prevention.  Cardiology consulted due to new onset afib and recommended f/u in the outpatient afib clinic.  Currently she is rate controlled on no medication.  Therapy evaluations were completed and pt was recommended for CIR.   Complete NIHSS TOTAL: 2  Patient's medical record from [facility] has been reviewed by the rehabilitation admission coordinator and physician.  Past Medical History  Past Medical History:  Diagnosis Date   Allergy    allergic rhinitis   Anxiety    Cataract    removed both eyes    Infertility, female    Osteoarthritis of both knees    OA    PONV (postoperative nausea and vomiting)    Post-menopausal bleeding    neg endo bx.    Has the patient had major surgery during 100 days prior  to admission? No  Family History   family history includes Alzheimer's disease in her father; COPD in her brother; Depression in her sister; Hypertension in her father, mother, and sister; Hypothyroidism in her sister and sister; Osteoporosis in her father, mother, sister, and sister; Parkinson's disease in her sister; Stroke in her mother; Thyroid disease in her sister.  Current Medications  Current Facility-Administered Medications:     stroke: mapping our early stages of recovery book, , Does not apply, Once, Toberman, Stevi W, NP   0.9 %  sodium chloride infusion, 250 mL, Intravenous, Continuous, Hunsucker, Bonna Gains, MD   acetaminophen (TYLENOL) tablet 650 mg, 650 mg, Oral, Q4H PRN **OR** acetaminophen (TYLENOL) 160 MG/5ML solution 650 mg, 650 mg, Per Tube, Q4H PRN **OR** [DISCONTINUED] acetaminophen (TYLENOL) suppository 650 mg, 650 mg, Rectal, Q4H PRN, Ebbie Latus, Stevi W, NP   apixaban (ELIQUIS) tablet 5 mg, 5 mg, Oral, BID, Paytes,  Austin A, RPH, 5 mg at 06/01/21 1043   atorvastatin (LIPITOR) tablet 40 mg, 40 mg, Oral, Daily, Kroeger, Krista M., PA-C, 40 mg at 06/01/21 1043   influenza vaccine adjuvanted (FLUAD) injection 0.5 mL, 0.5 mL, Intramuscular, Tomorrow-1000, Xu, Jindong, MD   insulin aspart (novoLOG) injection 0-15 Units, 0-15 Units, Subcutaneous, Q4H, Hunsucker, Bonna Gains, MD, 2 Units at 06/01/21 1241   metoprolol succinate (TOPROL-XL) 24 hr tablet 25 mg, 25 mg, Oral, Daily, Shelva Majestic A, MD, 25 mg at 06/01/21 1043   ondansetron (ZOFRAN) injection 4 mg, 4 mg, Intravenous, Q6H PRN, Jacky Kindle, MD, 4 mg at 05/26/21 0815   pantoprazole (PROTONIX) EC tablet 40 mg, 40 mg, Oral, Daily, Rosalin Hawking, MD, 40 mg at 06/01/21 1043   senna-docusate (Senokot-S) tablet 1 tablet, 1 tablet, Oral, QHS PRN, Rikki Spearing, NP  Patients Current Diet:  Diet Order             DIET DYS 3 Room service appropriate? Yes with Assist; Fluid consistency: Nectar Thick  Diet effective now                   Precautions / Restrictions Precautions Precautions: Fall Precaution Comments: right sided weakness Restrictions Weight Bearing Restrictions: No   Has the patient had 2 or more falls or a fall with injury in the past year? No  Prior Activity Level Community (5-7x/wk): independent prior to admit, driving, no DME used  Prior Functional Level Self Care: Did the patient need help bathing, dressing, using the toilet or eating? Independent  Indoor Mobility: Did the patient need assistance with walking from room to room (with or without device)? Independent  Stairs: Did the patient need assistance with internal or external stairs (with or without device)? Independent  Functional Cognition: Did the patient need help planning regular tasks such as shopping or remembering to take medications? Independent  Patient Information Are you of Hispanic, Latino/a,or Spanish origin?: A. No, not of Hispanic, Latino/a, or Spanish  origin, X. Patient unable to respond What is your race?: X. Patient unable to respond, A. White Do you need or want an interpreter to communicate with a doctor or health care staff?: 0. No  Patient's Response To:  Health Literacy and Transportation Is the patient able to respond to health literacy and transportation needs?: No Health Literacy - How often do you need to have someone help you when you read instructions, pamphlets, or other written material from your doctor or pharmacy?: Never In the past 12 months, has lack of transportation kept you from medical appointments or  from getting medications?: No In the past 12 months, has lack of transportation kept you from meetings, work, or from getting things needed for daily living?: No  Development worker, international aid / [place]: None  Prior Device Use: Indicate devices/aids used by the patient prior to current illness, exacerbation or injury? None of the above  Current Functional Level Cognition  Arousal/Alertness: Awake/alert Overall Cognitive Status: Impaired/Different from baseline Current Attention Level: Sustained Orientation Level: Oriented X4 Following Commands: Follows one step commands consistently, Follows multi-step commands inconsistently Safety/Judgement: Decreased awareness of safety General Comments: Cognition improved overall however still with flat affect. Pt went to the sink to wash face, however once there she washed her hands instead and moved on to ambulating in the hallway without washing face. Awareness: Impaired Awareness Impairment: Intellectual impairment    Extremity Assessment (includes Sensation/Coordination)  Upper Extremity Assessment: RUE deficits/detail RUE Deficits / Details: R grip < L RUE Sensation: WNL RUE Coordination: decreased fine motor  Lower Extremity Assessment: Defer to PT evaluation RLE Deficits / Details: grossly 4-/5    ADLs  Overall ADL's : Needs  assistance/impaired Grooming: Moderate assistance Upper Body Bathing: Minimal assistance, Sitting Lower Body Bathing: Moderate assistance, Sit to/from stand Upper Body Dressing : Moderate assistance, Sitting Lower Body Dressing: Maximal assistance, Sit to/from stand Toilet Transfer: Minimal assistance, Ambulation, RW, Regular Toilet, Grab bars Toilet Transfer Details (indicate cue type and reason): min A for verbal cues for RW management and safety Toileting- Clothing Manipulation and Hygiene: Min guard, Sitting/lateral lean Toileting - Clothing Manipulation Details (indicate cue type and reason): has not voided since 4am per nursing - unsure of awareness Functional mobility during ADLs: Min guard, Rolling walker General ADL Comments: close min guard for safety; pt continues to have R bias and some R weakness that cause safety concerns with ambulation. Pt managed 3 steps this session with a reciprocal gait pattern    Mobility  Overal bed mobility: Modified Independent Bed Mobility: Supine to Sit Supine to sit: Min assist, HOB elevated Sit to supine: Max assist General bed mobility comments: Increased time but pt was able to manage exit and entrance without difficulty.    Transfers  Overall transfer level: Needs assistance Equipment used: None Transfers: Sit to/from Stand Sit to Stand: Supervision Stand pivot transfers: Min assist General transfer comment: Pt demonstrated proper hand placement on seated surface for safety. No assist required however supervision provided for safety.    Ambulation / Gait / Stairs / Wheelchair Mobility  Ambulation/Gait Ambulation/Gait assistance: Counsellor (Feet): 200 Feet Assistive device: Rolling walker (2 wheeled) Gait Pattern/deviations: Step-through pattern, Decreased step length - right, Decreased dorsiflexion - right General Gait Details: Decreased foot clearance on the R but overall good navigating in the hallway. She was able to  direction find despite several turns in the unit. Gait velocity: Decreased Gait velocity interpretation: 1.31 - 2.62 ft/sec, indicative of limited community ambulator Stairs: Yes Stairs assistance: Min guard Stair Management: Two rails, Alternating pattern, Forwards Number of Stairs: 5 General stair comments: Practice stairs in the rehab gym as part of the DGI    Posture / Balance Dynamic Sitting Balance Sitting balance - Comments: sitting EOB without LOB donned second gown and fixing hair with A Balance Overall balance assessment: Needs assistance Sitting-balance support: Feet supported Sitting balance-Leahy Scale: Good Sitting balance - Comments: sitting EOB without LOB donned second gown and fixing hair with A Standing balance support: Single extremity supported, During functional activity Standing balance-Leahy Scale: Fair  Standing balance comment: reliant on external support High Level Balance Comments: performed alternate step taps forward with R LE buckling at times stepping with and attempted marching in place with A due to R LE weakness Standardized Balance Assessment Standardized Balance Assessment : Dynamic Gait Index Dynamic Gait Index Level Surface: Mild Impairment Change in Gait Speed: Mild Impairment Gait with Horizontal Head Turns: Normal Gait with Vertical Head Turns: Mild Impairment Gait and Pivot Turn: Normal Step Over Obstacle: Mild Impairment Step Around Obstacles: Mild Impairment Steps: Mild Impairment Total Score: 18    Special needs/care consideration Diabetic management pre diabetes   Previous Home Environment (from acute therapy documentation) Living Arrangements: Spouse/significant other Available Help at Discharge: Family, Available 24 hours/day Type of Home: House Home Layout: Two level, Able to live on main level with bedroom/bathroom Home Access: Stairs to enter Entrance Stairs-Rails: None Entrance Stairs-Number of Steps: 4 Bathroom  Shower/Tub: Multimedia programmer: Standard  Discharge Living Setting Plans for Discharge Living Setting: Patient's home, Lives with (comment) (spouse) Type of Home at Discharge: House Discharge Home Layout: Able to live on main level with bedroom/bathroom Discharge Home Access: Stairs to enter Entrance Stairs-Rails: None Entrance Stairs-Number of Steps: 4 Discharge Bathroom Shower/Tub: Walk-in shower Discharge Bathroom Toilet: Standard Discharge Bathroom Accessibility: Yes How Accessible: Accessible via walker Does the patient have any problems obtaining your medications?: No  Social/Family/Support Systems Anticipated Caregiver: spouse, Malayshia All Anticipated Caregiver's Contact Information: [phone] Ability/Limitations of Caregiver: none Caregiver Availability: 24/7 Discharge Plan Discussed with Primary Caregiver: Yes Is Caregiver In Agreement with Plan?: Yes Does Caregiver/Family have Issues with Lodging/Transportation while Pt is in Rehab?: No  Goals Patient/Family Goal for Rehab: PT/OT supervision/mod I, SLP supervision Expected length of stay: 10-14 days Pt/Family Agrees to Admission and willing to participate: Yes Program Orientation Provided & Reviewed with Pt/Caregiver Including Roles  & Responsibilities: Yes  Barriers to Discharge: Insurance for SNF coverage  Decrease burden of Care through IP rehab admission: n/a  Possible need for SNF placement upon discharge: No  Patient Condition: I have reviewed medical records from [facility], spoken with CM, and patient and spouse. I met with patient at the bedside and discussed via phone for inpatient rehabilitation assessment.  Patient will benefit from ongoing PT, OT, and SLP, can actively participate in 3 hours of therapy a day 5 days of the week, and can make measurable gains during the admission.  Patient will also benefit from the coordinated team approach during an Inpatient Acute Rehabilitation admission.   The patient will receive intensive therapy as well as Rehabilitation physician, nursing, social worker, and care management interventions.  Due to bladder management, bowel management, safety, skin/wound care, disease management, medication administration, pain management, and patient education the patient requires 24 hour a day rehabilitation nursing.  The patient is currently min assist with mobility and basic ADLs.  Discharge setting and therapy post discharge at home with home health is anticipated.  Patient has agreed to participate in the Acute Inpatient Rehabilitation Program and will admit today.  Preadmission Screen Completed By:  Michel Santee, PT, DPT 06/01/2021 12:42 PM ______________________________________________________________________   Discussed status with Dr. Dagoberto Ligas on 06/01/21  at .now  and received approval for admission today.  Admission Coordinator:  Michel Santee, PT, time 12:42 PM Sudie Grumbling 06/01/21     Assessment/Plan: Diagnosis: Does the need for close, 24 hr/day Medical supervision in concert with the patient's rehab needs make it unreasonable for this patient to be served in a less  intensive setting? Yes Co-Morbidities requiring supervision/potential complications: Anxiety, B/L MCA strokes with apahsia, dysphagia, Afib with RVR on Eliquis; R ACA stroke as well- R hemiparesis/L gaze preference Due to bladder management, bowel management, safety, skin/wound care, disease management, medication administration, and patient education, does the patient require 24 hr/day rehab nursing? Yes Does the patient require coordinated care of a physician, rehab nurse, PT, OT, and SLP to address physical and functional deficits in the context of the above medical diagnosis(es)? Yes Addressing deficits in the following areas: balance, endurance, locomotion, strength, transferring, bowel/bladder control, bathing, dressing, feeding, grooming, toileting, cognition, speech, language, and  swallowing Can the patient actively participate in an intensive therapy program of at least 3 hrs of therapy 5 days a week? Yes The potential for patient to make measurable gains while on inpatient rehab is good Anticipated functional outcomes upon discharge from inpatient rehab: modified independent PT, modified independent OT, supervision SLP Estimated rehab length of stay to reach the above functional goals is: 10-14 days Anticipated discharge destination: Home 10. Overall Rehab/Functional Prognosis: good   MD Signature:

## 2021-05-31 NOTE — Progress Notes (Signed)
Occupational Therapy Treatment Patient Details Name: Deborah Carey MRN: [id number] DOB: 21-May-1951 Today's Date: 05/31/2021   History of present illness 70 y.o. female presents to [facility] on 05/25/2021 with R sided weakness, L eye deviation and aphasia. Pt received TKN and was intubated due to worsening mental status. CTA demonstrates L M1/MCA occlusion, pt taken to IR for mechanical thrombectomy with complete revascularization. PMH includes osteoarthritis, anxiety, and cataracts.   OT comments  Deborah Carey continues to progress well, husband present this session and supportive. Pt completed all functional ambulation and transfers with RW given min guard for safety. Min A verbal cues for appropriate RW management during ADLs. At the end of the session pt's husband expressed concern in regard to pt's diet; educated pt and husband on current diet orders and informed the RN staff of husbands reports that pt has been drinking thin liquids today. Pt continues to benefit from OT acutely. D/c remains appropriate.    Recommendations for follow up therapy are one component of a multi-disciplinary discharge planning process, led by the attending physician.  Recommendations may be updated based on patient status, additional functional criteria and insurance authorization.    Follow Up Recommendations  CIR    Equipment Recommendations  3 in 1 bedside commode       Precautions / Restrictions Precautions Precautions: Fall Precaution Comments: right sided weakness Restrictions Weight Bearing Restrictions: No       Mobility Bed Mobility Overal bed mobility: Modified Independent             General bed mobility comments: +incrased time. Bed flat, no use of rails    Transfers Overall transfer level: Needs assistance Equipment used: Rolling walker (2 wheeled) Transfers: Sit to/from Stand Sit to Stand: Supervision         General transfer comment: pt demonstrated better hand placement  during transfers this session    Balance Overall balance assessment: Needs assistance Sitting-balance support: Feet supported Sitting balance-Leahy Scale: Good     Standing balance support: Single extremity supported;During functional activity Standing balance-Leahy Scale: Fair                             ADL either performed or assessed with clinical judgement   ADL Overall ADL's : Needs assistance/impaired                         Toilet Transfer: Minimal assistance;Ambulation;RW;Regular Toilet;Grab bars Toilet Transfer Details (indicate cue type and reason): min A for verbal cues for RW management and safety Toileting- Clothing Manipulation and Hygiene: Min guard;Sitting/lateral lean       Functional mobility during ADLs: Min guard;Rolling walker General ADL Comments: close min guard for safety; pt continues to have R bias and some R weakness that cause safety concerns with ambulation. Pt managed 3 steps this session with a reciprocal gait pattern      Cognition Arousal/Alertness: Awake/alert Behavior During Therapy: Flat affect Overall Cognitive Status: Within Functional Limits for tasks assessed                                 General Comments: continues wtih flat affect, husband present this session and states noted improvement over the last few says              General Comments Pt's husband inquired about therapies post CIR; he also inquired  about pt's diet, stating the pt has been drinking thin liquids today. I educated pt and her husband about current nectar thick diet orders, RN and SLP also notified    Pertinent Vitals/ Pain       Pain Assessment: No/denies pain Pain Intervention(s): Monitored during session   Frequency  Min 2X/week        Progress Toward Goals  OT Goals(current goals can now be found in the care plan section)  Progress towards OT goals: Progressing toward goals  Acute Rehab OT Goals Patient  Stated Goal: get home soon OT Goal Formulation: With patient/family Time For Goal Achievement: 06/10/21 Potential to Achieve Goals: Good ADL Goals Pt Will Perform Upper Body Bathing: with set-up;sitting Pt Will Perform Lower Body Bathing: with supervision;sit to/from stand Pt Will Perform Upper Body Dressing: with set-up;sitting Pt Will Transfer to Toilet: with min guard assist;ambulating Pt Will Perform Toileting - Clothing Manipulation and hygiene: with supervision;sitting/lateral leans;sit to/from stand Additional ADL Goal #1: Pt will complete 3 step ADL task in moderately distracting environemtn without need for redirection  Plan Discharge plan remains appropriate       AM-PAC OT "6 Clicks" Daily Activity     Outcome Measure   Help from another person eating meals?: A Little (supervision and diet limitations) Help from another person taking care of personal grooming?: A Little Help from another person toileting, which includes using toliet, bedpan, or urinal?: A Little Help from another person bathing (including washing, rinsing, drying)?: A Lot Help from another person to put on and taking off regular upper body clothing?: A Little Help from another person to put on and taking off regular lower body clothing?: A Lot 6 Click Score: 16    End of Session Equipment Utilized During Treatment: Gait belt;Rolling walker  OT Visit Diagnosis: Unsteadiness on feet (R26.81);Other abnormalities of gait and mobility (R26.89);Muscle weakness (generalized) (M62.81);Other symptoms and signs involving cognitive function;Hemiplegia and hemiparesis Hemiplegia - Right/Left: Right Hemiplegia - dominant/non-dominant: Dominant Hemiplegia - caused by: Cerebral infarction   Activity Tolerance Patient tolerated treatment well   Patient Left in bed;with call bell/phone within reach;with bed alarm set   Nurse Communication Mobility status        Time: 4235-3614 OT Time Calculation (min): 20  min  Charges: OT General Charges $OT Visit: 1 Visit OT Treatments $Self Care/Home Management : 8-22 mins    Deborah Carey 05/31/2021, 3:09 PM

## 2021-05-31 NOTE — Progress Notes (Signed)
STROKE TEAM PROGRESS NOTE   SUBJECTIVE (INTERVAL HISTORY) Patient is sitting up in bed.  She is doing well.  She has no complaints.  She is awaiting rehab bed.  Vital signs stable.  Neurological exam unchanged.  Heart rate and blood pressure well controlled.  OBJECTIVE Temp:  [98.2 F (36.8 C)-98.7 F (37.1 C)] 98.7 F (37.1 C) (09/26 0844) Pulse Rate:  [70-82] 74 (09/26 0844) Cardiac Rhythm: Atrial flutter (09/26 0757) Resp:  [17-19] 17 (09/26 0844) BP: (124-143)/(70-100) 138/84 (09/26 0844) SpO2:  [96 %-100 %] 98 % (09/26 0844)  Recent Labs  Lab 05/30/21 1642 05/30/21 1944 05/30/21 2333 05/31/21 0359 05/31/21 0746  GLUCAP 121* 127* 99 120* 113*   Recent Labs  Lab 05/25/21 1907 05/25/21 2204 05/27/21 0318 05/28/21 0316 05/29/21 0617 05/30/21 0447 05/31/21 0724  NA  --    < > 139 138 140 139 139  K  --    < > 4.0 3.4* 3.4* 3.6 3.7  CL  --    < > 105 103 106 104 106  CO2  --    < > _0 GLUCOSE  --    < > 90 91 106* 111* 106*  BUN  --    < > _1 CREATININE  --    < > 0.77 0.66 0.67 0.75 0.77  CALCIUM  --    < > 8.7* 8.6* 8.9 9.0 9.3  MG 2.1  --   --   --   --   --   --   PHOS 3.6  --   --   --   --   --   --    < > = values in this interval not displayed.   Recent Labs  Lab 05/25/21 1520 05/26/21 0429  AST 20 18  ALT 16 15  ALKPHOS 68 47  BILITOT 0.8 0.6  PROT 7.3 6.1*  ALBUMIN 4.2 3.4*   Recent Labs  Lab 05/25/21 1447 05/25/21 2204 05/27/21 0318 05/28/21 0316 05/29/21 0617 05/30/21 0447 05/31/21 0724  WBC 10.0   < > 8.1 9.5 8.7 10.6* 9.3  NEUTROABS 5.9  --   --   --   --   --   --   HGB 14.8   < > 11.6* 11.5* 12.3 12.8 12.6  HCT 40.3   < > 34.5* 33.7* 34.8* 35.2* 35.9*  MCV 92.2   < > 94.8 92.6 91.3 89.6 91.1  PLT 267   < > 177 177 193 207 210   < > = values in this interval not displayed.   No results for input(s): CKTOTAL, CKMB, CKMBINDEX, TROPONINI in the last 168 hours. No results for input(s): LABPROT, INR in the  last 72 hours.  No results for input(s): COLORURINE, LABSPEC, [place], GLUCOSEU, HGBUR, BILIRUBINUR, KETONESUR, PROTEINUR, UROBILINOGEN, NITRITE, LEUKOCYTESUR in the last 72 hours.  Invalid input(s): APPERANCEUR     Component Value Date/Time   CHOL 171 05/26/2021 0429   TRIG 64 05/26/2021 0429   TRIG 66 05/26/2021 0429   HDL 49 05/26/2021 0429   CHOLHDL 3.5 05/26/2021 0429   VLDL 13 05/26/2021 0429   LDLCALC 109 (H) 05/26/2021 0429   Lab Results  Component Value Date   HGBA1C 5.8 (H) 05/26/2021   No results found for: LABOPIA, COCAINSCRNUR, [place], [place], [place], [place]  Recent Labs  Lab 05/25/21 [place] <10    I have personally reviewed the radiological images below and  agree with the radiology interpretations.  CT ABDOMEN PELVIS W WO CONTRAST  Result Date: 05/26/2021 CLINICAL DATA:  Abdominal pain. EXAM: CT ABDOMEN AND PELVIS WITHOUT AND WITH CONTRAST TECHNIQUE: Multidetector CT imaging of the abdomen and pelvis was performed following the standard protocol before and following the bolus administration of intravenous contrast. CONTRAST:  70m OMNIPAQUE IOHEXOL 350 MG/ML SOLN COMPARISON:  None. FINDINGS: Lower chest: The lung bases are clear of an acute process. No pleural effusions or pulmonary lesions. The heart is within normal limits in size. No pericardial effusion. Hepatobiliary: No hepatic lesions or intrahepatic biliary dilatation. High attenuation material in the gallbladder likely due to vicarious excretion of contrast material from prior CT scan and interventional procedure. There is a 2.4 cm gallstone noted the gallbladder but no findings suspicious for acute cholecystitis. No common bile duct dilatation. Pancreas: No mass, inflammation or ductal dilatation. Spleen: Normal size.  No focal lesions. Adrenals/Urinary Tract: Adrenal glands and kidneys are unremarkable. There is some contrast in both collecting systems. The bladder also contains contrast material. No  bladder mass or asymmetric bladder wall thickening. Stomach/Bowel: The stomach, duodenum, small bowel and colon are unremarkable. No acute inflammatory changes, mass lesions or obstructive findings. The terminal ileum is normal. The appendix is normal. Scattered colonic diverticulosis but no findings for acute diverticulitis. Vascular/Lymphatic: Scattered atherosclerotic calcifications but no aneurysm or dissection. The major venous structures are patent. No mesenteric or retroperitoneal mass or adenopathy. Reproductive: The uterus and ovaries are unremarkable. Other: No pelvic mass or adenopathy. No free pelvic fluid collections. No inguinal mass or adenopathy. Small periumbilical abdominal wall hernia containing fat. Musculoskeletal: No significant bony findings. IMPRESSION: 1. No acute abdominal/pelvic findings, mass lesions or adenopathy. 2. Cholelithiasis without findings suspicious for acute cholecystitis. 3. Scattered colonic diverticulosis but no findings for acute diverticulitis. Electronically Signed   By: PMarijo SanesM.D.   On: 05/26/2021 20:30   MR ANGIO HEAD WO CONTRAST  Result Date: 05/26/2021 CLINICAL DATA:  Follow-up stroke. Left-sided weakness. Left MCA occlusion with intervention. EXAM: MRA HEAD WITHOUT CONTRAST TECHNIQUE: Angiographic images of the Circle of Willis were acquired using MRA technique without intravenous contrast. COMPARISON:  MRI earlier same day FINDINGS: Anterior circulation: Both internal carotid arteries are widely patent through the skull base and siphon regions. The anterior and middle cerebral vessels are patent. Reconstituted flow in the left MCA without proximal stenosis or missing distal branch vessels. Posterior circulation: Both vertebral arteries widely patent to the basilar. No basilar stenosis. Stenoses in the PCA P2 segments, worse on the left than the right. Anatomic variants: None significant. Other: None. IMPRESSION: Good appearance of the anterior  circulation flow presently. Status post mechanical thrombectomy on the left. Wide patency of the left MCA branches presently. 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 Electronically Signed   By: MNelson ChimesM.D.   On: 05/26/2021 15:18   MR BRAIN WO CONTRAST  Result Date: 05/26/2021 CLINICAL DATA:  Follow-up stroke. EXAM: MRI HEAD WITHOUT CONTRAST TECHNIQUE: Multiplanar, multiecho pulse sequences of the brain and surrounding structures were obtained without intravenous contrast. COMPARISON:  MR angiography same day. CT studies and intervention done yesterday. FINDINGS: Brain: There is acute infarction affecting the corpus stratum on the left. Minimal punctate infarction in the left posterior frontal subcortical white matter. Swelling of the putamen and caudate, with minimal petechial blood products but without frank hemorrhage. Three punctate foci of acute infarction in the right medial frontal lobe, possibly small embolic infarctions in  the right anterior cerebral artery territory. No midline shift. Elsewhere, there is old cortical and subcortical infarction in the right parietal lobe and moderate chronic small-vessel ischemic changes of the cerebral hemispheric white matter. No hydrocephalus. No extra-axial collection. Vascular: Major vessels at the base of the brain show flow. Skull and upper cervical spine: Negative Sinuses/Orbits: Clear/normal Other: None IMPRESSION: Acute infarction of the left putamen and caudate. Minimal petechial blood products but no frank hematoma. Mild swelling but no midline shift. Three punctate acute infarctions within the medial right frontal lobe, probably micro embolic infarctions in the anterior cerebral artery territory. Old infarction in the right parietal cortical and subcortical brain and seen affecting the cerebral hemispheric deep white matter extensively. Electronically Signed   By: Nelson Chimes M.D.   On: 05/26/2021 15:51   IR CT Head  Ltd  Result Date: 05/25/2021 INDICATION: MARIAVICTORIA NOTTINGHAM is an 70 year-old female with a past medical history significant for osteoarthritis and cataracts who presented emergently to the ED at [facility] due to right side weakness, with eyes drifting to left, and unable to talk with decreased level of consciousness. NIHSS at presentation was 19; baseline modified Rankin scale 0. Head CT showed no evidence of a large acute infarct or hemorrhage (ASPECTS 10). CTA showed a proximal left M1/MCA occlusion with good collaterals. She receive IV TNK and was then transferred to our service for a diagnostic cerebral angiogram and mechanical thrombectomy. EXAM: ULTRASOUND GUIDED VASCULAR ACCESS DIAGNOSTIC CEREBRAL ANGIOGRAM MECHANICAL THROMBECTOMY FLAT PANEL HEAD CT COMPARISON:  CT/CTA 05/25/2021. MEDICATIONS: No antibiotic administered. ANESTHESIA/SEDATION: The procedure was performed under general anesthesia. CONTRAST:  25 mL of Omnipaque 240 mg/mL FLUOROSCOPY TIME:  Fluoroscopy Time: 4 minutes 36 seconds (420 mGy). COMPLICATIONS: None immediate. TECHNIQUE: Informed written consent was obtained from the patient's husband after a thorough discussion of the procedural risks, benefits and alternatives. All questions were addressed. Maximal Sterile Barrier Technique was utilized including caps, mask, sterile gowns, sterile gloves, sterile drape, hand hygiene and skin antiseptic. A timeout was performed prior to the initiation of the procedure. The right groin was prepped and draped in the usual sterile fashion. Using a micropuncture kit and the modified Seldinger technique, access was gained to the right common femoral artery and an 8 French sheath was placed. Real-time ultrasound guidance was utilized for vascular access including the acquisition of a permanent ultrasound image documenting patency of the accessed vessel. Under fluoroscopy, a Zoom 88 guide catheter was navigated over a 6 [place] Berenstein 2 catheter and a 0.035"  Terumo Glidewire into the aortic arch. The catheter was placed into the left common carotid artery and then advanced into the left internal carotid artery. The Berenstein 2 catheter was removed. Frontal and lateral angiograms of the head were obtained. FINDINGS: 1. Normal caliber of the right common femoral artery. 2. Occlusion of the left MCA at the proximal M1 segment. PROCEDURE: Under biplane roadmap, a zoom 71 aspiration catheter was navigated over an Aristotle 24 microguidewire into the cavernous segment of the left ICA. The aspiration catheter was then advanced to the level of occlusion and connected to an aspiration pump. Continuous aspiration was performed for 2 minutes. The guide catheter was connected to a VacLok syringe and advanced into the M1 segment while the aspiration catheter wasremoved under constant aspiration. The Zoom 88 guide catheter was continuously aspirated until clear blood return was obtained. Left ICA angiograms with frontal and lateral views showed complete left MCA vascular tree recanalization without evidence of thromboembolic complication.  Flat panel CT of the head was obtained and post processed in a separate workstation with concurrent attending physician supervision. Selected images were sent to PACS. No evidence of hemorrhagic complication. A right common femoral artery angiogram was then obtained in right anterior oblique view via sheath side port. The puncture is at the level of the common femoral artery which has normal caliber and contour, adequate for closure device. The femoral sheath was exchanged over the wire for a Perclose ProStyle which was utilized for access closure. Immediate hemostasis was achieved. IMPRESSION: Successful and uncomplicated mechanical thrombectomy performed with direct contact aspiration for treatment of a proximal left M1/MCA occlusion with complete recanalization (TICI3). PLAN: 1. Transfer to ICA for continued care. 2. SBP 120-140 mmHg for 24  hours post procedure. 3. Bed rest for 6 hours. Electronically Signed   By: Pedro Earls M.D.   On: 05/25/2021 17:58   IR US Guide Vasc Access Right  Result Date: 05/25/2021 INDICATION: LAROSA RHINES is an 70 year-old female with a past medical history significant for osteoarthritis and cataracts who presented emergently to the ED at [facility] due to right side weakness, with eyes drifting to left, and unable to talk with decreased level of consciousness. NIHSS at presentation was 19; baseline modified Rankin scale 0. Head CT showed no evidence of a large acute infarct or hemorrhage (ASPECTS 10). CTA showed a proximal left M1/MCA occlusion with good collaterals. She receive IV TNK and was then transferred to our service for a diagnostic cerebral angiogram and mechanical thrombectomy. EXAM: ULTRASOUND GUIDED VASCULAR ACCESS DIAGNOSTIC CEREBRAL ANGIOGRAM MECHANICAL THROMBECTOMY FLAT PANEL HEAD CT COMPARISON:  CT/CTA 05/25/2021. MEDICATIONS: No antibiotic administered. ANESTHESIA/SEDATION: The procedure was performed under general anesthesia. CONTRAST:  25 mL of Omnipaque 240 mg/mL FLUOROSCOPY TIME:  Fluoroscopy Time: 4 minutes 36 seconds (420 mGy). COMPLICATIONS: None immediate. TECHNIQUE: Informed written consent was obtained from the patient's husband after a thorough discussion of the procedural risks, benefits and alternatives. All questions were addressed. Maximal Sterile Barrier Technique was utilized including caps, mask, sterile gowns, sterile gloves, sterile drape, hand hygiene and skin antiseptic. A timeout was performed prior to the initiation of the procedure. The right groin was prepped and draped in the usual sterile fashion. Using a micropuncture kit and the modified Seldinger technique, access was gained to the right common femoral artery and an 8 French sheath was placed. Real-time ultrasound guidance was utilized for vascular access including the acquisition of a permanent ultrasound  image documenting patency of the accessed vessel. Under fluoroscopy, a Zoom 88 guide catheter was navigated over a 6 [place] Berenstein 2 catheter and a 0.035" Terumo Glidewire into the aortic arch. The catheter was placed into the left common carotid artery and then advanced into the left internal carotid artery. The Berenstein 2 catheter was removed. Frontal and lateral angiograms of the head were obtained. FINDINGS: 1. Normal caliber of the right common femoral artery. 2. Occlusion of the left MCA at the proximal M1 segment. PROCEDURE: Under biplane roadmap, a zoom 71 aspiration catheter was navigated over an Aristotle 24 microguidewire into the cavernous segment of the left ICA. The aspiration catheter was then advanced to the level of occlusion and connected to an aspiration pump. Continuous aspiration was performed for 2 minutes. The guide catheter was connected to a VacLok syringe and advanced into the M1 segment while the aspiration catheter wasremoved under constant aspiration. The Zoom 88 guide catheter was continuously aspirated until clear blood return was obtained. Left  ICA angiograms with frontal and lateral views showed complete left MCA vascular tree recanalization without evidence of thromboembolic complication. Flat panel CT of the head was obtained and post processed in a separate workstation with concurrent attending physician supervision. Selected images were sent to PACS. No evidence of hemorrhagic complication. A right common femoral artery angiogram was then obtained in right anterior oblique view via sheath side port. The puncture is at the level of the common femoral artery which has normal caliber and contour, adequate for closure device. The femoral sheath was exchanged over the wire for a Perclose ProStyle which was utilized for access closure. Immediate hemostasis was achieved. IMPRESSION: Successful and uncomplicated mechanical thrombectomy performed with direct contact aspiration for  treatment of a proximal left M1/MCA occlusion with complete recanalization (TICI3). PLAN: 1. Transfer to ICA for continued care. 2. SBP 120-140 mmHg for 24 hours post procedure. 3. Bed rest for 6 hours. Electronically Signed   By: Pedro Earls M.D.   On: 05/25/2021 17:58   DG CHEST PORT 1 VIEW  Result Date: 05/26/2021 CLINICAL DATA:  Status post intubation. EXAM: PORTABLE CHEST 1 VIEW COMPARISON:  05/25/2021 FINDINGS: The ETT tip is 1.2 cm above the carina. Normal heart size. Interval resolution of previous interstitial and airspace opacities. Remote appearing right posterior rib fractures. IMPRESSION: 1. Stable position of ET tube with tip above the carina. 2. Interval clearing of interstitial edema. Electronically Signed   By: Kerby Moors M.D.   On: 05/26/2021 05:59   DG Chest Portable 1 View  Result Date: 05/25/2021 CLINICAL DATA:  Shortness of breath EXAM: PORTABLE CHEST 1 VIEW COMPARISON:  None. FINDINGS: Endotracheal tube terminates proximally 1.5 cm above the carina. Heart size appears mildly enlarged. Diffuse bilateral interstitial prominence. No pleural effusion or pneumothorax. Age indeterminate fractures of the posterior right seventh and likely eighth ribs. IMPRESSION: 1. Endotracheal tube terminates 1.5 cm above the carina. 2. Diffuse bilateral interstitial prominence may reflect atelectasis, edema, versus atypical/viral infection. 3. Age indeterminate fractures of the posterior right seventh and likely eighth ribs. Electronically Signed   By: Davina Poke D.O.   On: 05/25/2021 16:03   DG Swallowing Func-Speech Pathology  Result Date: 05/27/2021 Table formatting from the original result was not included. Objective Swallowing Evaluation: Type of Study: MBS-Modified Barium Swallow Study  Patient Details Name: JARISSA SHERIFF MRN: [id number] Date of Birth: [birth year] Today's Date: 05/27/2021 Time: SLP Start Time (ACUTE ONLY): 1305 -SLP Stop Time (ACUTE ONLY): 1315 SLP Time  Calculation (min) (ACUTE ONLY): 10 min Past Medical History: Past Medical History: Diagnosis Date  Allergy   allergic rhinitis  Anxiety   Cataract   removed both eyes   Infertility, female   Osteoarthritis of both knees   OA   PONV (postoperative nausea and vomiting)   Post-menopausal bleeding   neg endo bx. Past Surgical History: Past Surgical History: Procedure Laterality Date  cataract surgery  2011  CESAREAN SECTION    times 2  COLONOSCOPY  2010  hems   ENDOMETRIAL BIOPSY    neg for CA cells  FACIAL COSMETIC SURGERY  2006  face lift  FOOT SURGERY  1998  rt heel spur removal  IR CT HEAD LTD  05/25/2021  IR PERCUTANEOUS ART THROMBECTOMY/INFUSION INTRACRANIAL INC DIAG ANGIO  05/25/2021  IR US GUIDE VASC ACCESS RIGHT  05/25/2021  RADIOLOGY WITH ANESTHESIA N/A 05/25/2021  Procedure: IR WITH ANESTHESIA;  Surgeon: Aletta Edouard, MD;  Location: [place];  Service: Radiology;  Laterality: N/A;  REFRACTIVE SURGERY  08/1999 HPI: 70 y.o. F with PMH of cataracts, anxiety, osteoarthritis who was at the cancer center with her husband when she started having right-sided weakness with left eye deviation and aphasia. CTA with left M1/MCA occlusion and she was taken emergently to IR for mechanical thrombectomy which resulted in complete recanalization. Intubated 9/20 for procedure and extubated the same day.  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 Oral deficits c/b delayed oral transit, piecemeal swallow with mechanical soft textures, and reduced barium tablet propulsion to pharynx during barium tablet and nectar thick liquids series (pt able to propel tabelt to pharynx after 3 attempts). Pharyngeal deficits marked by reduced timely swallow initiation with thin liquids/ decreased timely epiglottic inversion allowing for pre swallow spillage and laryngeal  penetration and tracheal aspiration of thins via both cup and straw (PAS-7,8). Pt would trigger a delayed throat clear in some instances but overall sensation of aspiration events remained poor and aspirates did not clear trachea. Pt with isolated instance of laryngeal penetration of nectar thick liquids when with barium tablet series only (PAS-3) nectar thick in isolation yielded intact airway protection as well as remaining POs tested (PAS-1). Recommend dysphagia 3 (mechanical soft) and nectar thick liquids with meds in puree and full supervision with POs. SLP to follow up. SLP Visit Diagnosis Dysphagia, oropharyngeal phase (R13.12) Attention and concentration deficit following -- Frontal lobe and executive function deficit following -- Impact on safety and function Moderate aspiration risk;Mild aspiration risk   CHL IP TREATMENT RECOMMENDATION 05/27/2021 Treatment Recommendations Therapy as outlined in treatment plan below   Prognosis 05/27/2021 Prognosis for Safe Diet Advancement Good Barriers to Reach Goals Language deficits;Time post onset Barriers/Prognosis Comment -- CHL IP DIET RECOMMENDATION 05/27/2021 SLP Diet Recommendations Dysphagia 3 (Mech soft) solids;Nectar thick liquid Liquid Administration via Cup;Straw Medication Administration Whole meds with puree Compensations Small sips/bites;Slow rate;Minimize environmental distractions Postural Changes Seated upright at 90 degrees;Remain semi-upright after after feeds/meals (Comment)   CHL IP OTHER RECOMMENDATIONS 05/27/2021 Recommended Consults -- Oral Care Recommendations Oral care BID Other Recommendations Order thickener from pharmacy   CHL IP FOLLOW UP RECOMMENDATIONS 05/27/2021 Follow up Recommendations Other (comment)   CHL IP FREQUENCY AND DURATION 05/27/2021 Speech Therapy Frequency (ACUTE ONLY) min 2x/week Treatment Duration 2 weeks      CHL IP ORAL PHASE 05/27/2021 Oral Phase Impaired Oral - Pudding Teaspoon -- Oral - Pudding Cup -- Oral - Honey  Teaspoon -- Oral - Honey Cup Delayed oral transit Oral - Nectar Teaspoon -- Oral - Nectar Cup Delayed oral transit Oral - Nectar Straw -- Oral - Thin Teaspoon -- Oral - Thin Cup Delayed oral transit Oral - Thin Straw Delayed oral transit Oral - Puree Delayed oral transit Oral - Mech Soft Delayed oral transit;Piecemeal swallowing;Decreased bolus cohesion Oral - Regular -- Oral - Multi-Consistency -- Oral - Pill Delayed oral transit;Reduced posterior propulsion;Holding of bolus Oral Phase - Comment --  CHL IP PHARYNGEAL PHASE 05/27/2021 Pharyngeal Phase Impaired Pharyngeal- Pudding Teaspoon -- Pharyngeal -- Pharyngeal- Pudding Cup -- Pharyngeal -- Pharyngeal- Honey Teaspoon -- Pharyngeal -- Pharyngeal- Honey Cup Delayed swallow initiation-vallecula;Reduced tongue base retraction;Pharyngeal residue - valleculae Pharyngeal -- Pharyngeal- Nectar Teaspoon -- Pharyngeal -- Pharyngeal- Nectar Cup Delayed swallow initiation-vallecula;Pharyngeal residue - valleculae Pharyngeal -- Pharyngeal- Nectar Straw -- Pharyngeal -- Pharyngeal- Thin Teaspoon -- Pharyngeal -- Pharyngeal- Thin Cup Delayed swallow  initiation-pyriform sinuses;Penetration/Aspiration before swallow;Reduced airway/laryngeal closure;Reduced anterior laryngeal mobility;Reduced epiglottic inversion Pharyngeal Material enters airway, passes BELOW cords without attempt by patient to eject out (silent aspiration);Material enters airway, passes BELOW cords and not ejected out despite cough attempt by patient Pharyngeal- Thin Straw Delayed swallow initiation-pyriform sinuses;Reduced airway/laryngeal closure;Penetration/Aspiration before swallow Pharyngeal Material enters airway, passes BELOW cords without attempt by patient to eject out (silent aspiration);Material enters airway, passes BELOW cords and not ejected out despite cough attempt by patient Pharyngeal- Puree Delayed swallow initiation-vallecula Pharyngeal -- Pharyngeal- Mechanical Soft Delayed swallow  initiation-vallecula;Reduced tongue base retraction;Pharyngeal residue - valleculae Pharyngeal -- Pharyngeal- Regular -- Pharyngeal -- Pharyngeal- Multi-consistency -- Pharyngeal -- Pharyngeal- Pill Reduced airway/laryngeal closure;Penetration/Aspiration before swallow;Penetration/Aspiration during swallow;Pharyngeal residue - valleculae Pharyngeal Material enters airway, remains ABOVE vocal cords and not ejected out Pharyngeal Comment --  CHL IP CERVICAL ESOPHAGEAL PHASE 05/27/2021 Cervical Esophageal Phase WFL Pudding Teaspoon -- Pudding Cup -- Honey Teaspoon -- Honey Cup -- Nectar Teaspoon -- Nectar Cup -- Nectar Straw -- Thin Teaspoon -- Thin Cup -- Thin Straw -- Puree -- Mechanical Soft -- Regular -- Multi-consistency -- Pill -- Cervical Esophageal Comment -- Chelsea E Hartness MA, CCC-SLP 05/27/2021, 2:16 PM              ECHOCARDIOGRAM COMPLETE  Result Date: 05/26/2021    ECHOCARDIOGRAM REPORT   Patient Name:   MAZZIE BRODRICK Date of Exam: 05/26/2021 Medical Rec #:  [id number]     Height:       64.0 in Accession #:    [id number]    Weight:       211.6 lb Date of Birth:  09/21/1950      BSA:          2.004 m Patient Age:    89 years      BP:           129/61 mmHg Patient Gender: F             HR:           68 bpm. Exam Location:  Inpatient Procedure: 2D Echo, Cardiac Doppler, Color Doppler, Intracardiac Opacification            Agent and 3D Echo Indications:    Stroke I63.9  History:        Patient has no prior history of Echocardiogram examinations.                 Risk Factors:Hypertension. Acute respiratory insufficiency                 secondary to acute encephalopathy in the setting of CVA. Acute                 left MCA stroke s/p TNK and mechanical thrombectomy with                 complete revascularization. New diagnosis of atrial                 fibrillation, patient has converted to sinus rhythm.  Sonographer:    Darlina Sicilian RDCS Referring Phys: [id number] [place]  1. Left  ventricular ejection fraction, by estimation, is 50 to 55%. The left ventricle has low normal function. The left ventricle demonstrates regional wall motion abnormalities - mild septal hypokinesis. There is mild left ventricular hypertrophy of the septal segment. Left ventricular diastolic parameters are indeterminate.  2. Right ventricular systolic function is normal. The right ventricular size is normal. There is  normal pulmonary artery systolic pressure. The estimated right ventricular systolic pressure is 78.4 mmHg.  3. The mitral valve is normal in structure. Trivial mitral valve regurgitation. No evidence of mitral stenosis.  4. Tricuspid valve regurgitation is mild to moderate.  5. The aortic valve is grossly normal. Aortic valve regurgitation is not visualized. No aortic stenosis is present.  6. The inferior vena cava is normal in size with <50% respiratory variability, suggesting right atrial pressure of 8 mmHg. FINDINGS  Left Ventricle: Left ventricular ejection fraction, by estimation, is 50 to 55%. The left ventricle has low normal function. The left ventricle demonstrates regional wall motion abnormalities. 3D left ventricular ejection fraction analysis performed but  not reported based on interpreter judgement due to suboptimal quality. The left ventricular internal cavity size was normal in size. There is mild left ventricular hypertrophy of the septal segment. Left ventricular diastolic parameters are indeterminate. Right Ventricle: The right ventricular size is normal. Right vetricular wall thickness was not well visualized. Right ventricular systolic function is normal. There is normal pulmonary artery systolic pressure. The tricuspid regurgitant velocity is 2.31 m/s, and with an assumed right atrial pressure of 8 mmHg, the estimated right ventricular systolic pressure is 69.6 mmHg. Left Atrium: Left atrial size was normal in size. Right Atrium: Right atrial size was normal in size. Pericardium:  There is no evidence of pericardial effusion. Mitral Valve: The mitral valve is normal in structure. Mild mitral annular calcification. Trivial mitral valve regurgitation. No evidence of mitral valve stenosis. Tricuspid Valve: The tricuspid valve is normal in structure. Tricuspid valve regurgitation is mild to moderate. No evidence of tricuspid stenosis. Aortic Valve: The aortic valve is grossly normal. Aortic valve regurgitation is not visualized. No aortic stenosis is present. Pulmonic Valve: The pulmonic valve was not well visualized. Pulmonic valve regurgitation is trivial. No evidence of pulmonic stenosis. Aorta: The aortic root is normal in size and structure. Venous: The inferior vena cava is normal in size with less than 50% respiratory variability, suggesting right atrial pressure of 8 mmHg. IAS/Shunts: No atrial level shunt detected by color flow Doppler.  LEFT VENTRICLE PLAX 2D LVIDd:         4.70 cm     Diastology LVIDs:         3.50 cm     LV e' medial:    10.10 cm/s LV PW:         0.80 cm     LV E/e' medial:  7.6 LV IVS:        1.10 cm     LV e' lateral:   9.87 cm/s LVOT diam:     2.00 cm     LV E/e' lateral: 7.7 LV SV:         44 LV SV Index:   22 LVOT Area:     3.14 cm  LV Volumes (MOD) LV vol d, MOD A2C: 76.2 ml LV vol d, MOD A4C: 57.2 ml LV vol s, MOD A2C: 20.0 ml LV vol s, MOD A4C: 12.7 ml LV SV MOD A2C:     56.2 ml LV SV MOD A4C:     57.2 ml LV SV MOD BP:      53.3 ml RIGHT VENTRICLE RV S prime:     12.30 cm/s TAPSE (M-mode): 1.7 cm LEFT ATRIUM             Index       RIGHT ATRIUM  Index LA diam:        4.00 cm 2.00 cm/m  RA Area:     12.10 cm LA Vol (A2C):   47.0 ml 23.45 ml/m RA Volume:   23.00 ml  11.48 ml/m LA Vol (A4C):   64.2 ml 32.04 ml/m LA Biplane Vol: 54.5 ml 27.20 ml/m  AORTIC VALVE LVOT Vmax:   71.68 cm/s LVOT Vmean:  45.300 cm/s LVOT VTI:    0.138 m  AORTA Ao Root diam: 3.00 cm Ao Asc diam:  2.80 cm MITRAL VALVE               TRICUSPID VALVE MV Area (PHT): 3.34 cm     TR Peak grad:   21.3 mmHg MV Decel Time: 227 msec    TR Vmax:        231.00 cm/s MV E velocity: 76.47 cm/s                            SHUNTS                            Systemic VTI:  0.14 m                            Systemic Diam: 2.00 cm Cherlynn Kaiser MD Electronically signed by Cherlynn Kaiser MD Signature Date/Time: 05/26/2021/5:49:00 PM    Final    IR PERCUTANEOUS ART THROMBECTOMY/INFUSION INTRACRANIAL INC DIAG ANGIO  Result Date: 05/25/2021 INDICATION: WESSIE SHANKS is an 70 year-old female with a past medical history significant for osteoarthritis and cataracts who presented emergently to the ED at [facility] due to right side weakness, with eyes drifting to left, and unable to talk with decreased level of consciousness. NIHSS at presentation was 19; baseline modified Rankin scale 0. Head CT showed no evidence of a large acute infarct or hemorrhage (ASPECTS 10). CTA showed a proximal left M1/MCA occlusion with good collaterals. She receive IV TNK and was then transferred to our service for a diagnostic cerebral angiogram and mechanical thrombectomy. EXAM: ULTRASOUND GUIDED VASCULAR ACCESS DIAGNOSTIC CEREBRAL ANGIOGRAM MECHANICAL THROMBECTOMY FLAT PANEL HEAD CT COMPARISON:  CT/CTA 05/25/2021. MEDICATIONS: No antibiotic administered. ANESTHESIA/SEDATION: The procedure was performed under general anesthesia. CONTRAST:  25 mL of Omnipaque 240 mg/mL FLUOROSCOPY TIME:  Fluoroscopy Time: 4 minutes 36 seconds (420 mGy). COMPLICATIONS: None immediate. TECHNIQUE: Informed written consent was obtained from the patient's husband after a thorough discussion of the procedural risks, benefits and alternatives. All questions were addressed. Maximal Sterile Barrier Technique was utilized including caps, mask, sterile gowns, sterile gloves, sterile drape, hand hygiene and skin antiseptic. A timeout was performed prior to the initiation of the procedure. The right groin was prepped and draped in the usual sterile fashion. Using  a micropuncture kit and the modified Seldinger technique, access was gained to the right common femoral artery and an 8 French sheath was placed. Real-time ultrasound guidance was utilized for vascular access including the acquisition of a permanent ultrasound image documenting patency of the accessed vessel. Under fluoroscopy, a Zoom 88 guide catheter was navigated over a 6 [place] Berenstein 2 catheter and a 0.035" Terumo Glidewire into the aortic arch. The catheter was placed into the left common carotid artery and then advanced into the left internal carotid artery. The Berenstein 2 catheter was removed. Frontal and lateral angiograms of the head were obtained. FINDINGS: 1.  Normal caliber of the right common femoral artery. 2. Occlusion of the left MCA at the proximal M1 segment. PROCEDURE: Under biplane roadmap, a zoom 71 aspiration catheter was navigated over an Aristotle 24 microguidewire into the cavernous segment of the left ICA. The aspiration catheter was then advanced to the level of occlusion and connected to an aspiration pump. Continuous aspiration was performed for 2 minutes. The guide catheter was connected to a VacLok syringe and advanced into the M1 segment while the aspiration catheter wasremoved under constant aspiration. The Zoom 88 guide catheter was continuously aspirated until clear blood return was obtained. Left ICA angiograms with frontal and lateral views showed complete left MCA vascular tree recanalization without evidence of thromboembolic complication. Flat panel CT of the head was obtained and post processed in a separate workstation with concurrent attending physician supervision. Selected images were sent to PACS. No evidence of hemorrhagic complication. A right common femoral artery angiogram was then obtained in right anterior oblique view via sheath side port. The puncture is at the level of the common femoral artery which has normal caliber and contour, adequate for closure  device. The femoral sheath was exchanged over the wire for a Perclose ProStyle which was utilized for access closure. Immediate hemostasis was achieved. IMPRESSION: Successful and uncomplicated mechanical thrombectomy performed with direct contact aspiration for treatment of a proximal left M1/MCA occlusion with complete recanalization (TICI3). PLAN: 1. Transfer to ICA for continued care. 2. SBP 120-140 mmHg for 24 hours post procedure. 3. Bed rest for 6 hours. Electronically Signed   By: Pedro Earls M.D.   On: 05/25/2021 17:58   CT HEAD CODE STROKE WO CONTRAST  Result Date: 05/25/2021 CLINICAL DATA:  Left-sided weakness EXAM: CT ANGIOGRAPHY HEAD AND NECK TECHNIQUE: Multidetector CT imaging of the head and neck was performed using the standard protocol during bolus administration of intravenous contrast. Multiplanar CT image reconstructions and MIPs were obtained to evaluate the vascular anatomy. Carotid stenosis measurements (when applicable) are obtained utilizing NASCET criteria, using the distal internal carotid diameter as the denominator. CONTRAST:  64m OMNIPAQUE IOHEXOL 350 MG/ML SOLN COMPARISON:  None. FINDINGS: CT HEAD FINDINGS Brain: There is ill-defined hypodensity in the left centrum semiovale. The left basal ganglia and insular cortex are preserved. The cortex throughout the left MCA distribution is preserved. There is focal hypodensity in the right parietal lobe likely reflecting remote infarct. Additional foci of hypodensity in the bilateral cerebral hemispheres are nonspecific but may reflect sequela of chronic white matter microangiopathy. There is no evidence of acute intracranial hemorrhage or extra-axial fluid collection. The ventricles are not enlarged. There is no mass lesion. There is no midline shift. Vascular: There is a dense MCA on the left. See below. Skull: Normal. Negative for fracture or focal lesion. Sinuses/orbits: The imaged paranasal sinuses are clear. The  mastoid air cells are clear. Bilateral lens implants are in place. The globes and orbits are otherwise unremarkable. Review of the MIP images confirms the above findings CTA NECK FINDINGS The CTA images are moderately degraded by motion artifact. Aortic arch: Standard branching. Imaged portion shows no evidence of aneurysm or dissection. No significant stenosis of the major arch vessel origins. Right carotid system: The right carotid system is suboptimally evaluated particularly in the region of the carotid bulb due to motion artifact. The right internal carotid artery has a medialized course. Otherwise, there is no definite focal stenosis, occlusion, dissection, or aneurysm. Left carotid system: The left carotid system is suboptimally evaluated due  to motion artifact particularly in the region of the carotid bulb. There is calcified atherosclerotic plaque of the left carotid bulb without definite hemodynamically significant stenosis or occlusion. There is no definite dissection or aneurysm. Vertebral arteries: The vertebral arteries appear patent, without definite focal stenosis, dissection, occlusion, or aneurysm. Skeleton: There is mild multilevel degenerative change of the cervical spine. There is no acute osseous abnormality or aggressive osseous lesion. Other neck: The soft tissues are unremarkable. Upper chest: There is a 1.1 cm x 1.0 cm soft tissue nodule in the medial right apex. Review of the MIP images confirms the above findings CTA HEAD FINDINGS Anterior circulation: There is calcification of the bilateral cavernous ICAs without focal stenosis or occlusion. There is occlusion of the proximal left M1 segment with reconstitution of flow at the M2 segments. There appears to be good collateral flow throughout the peripheral MCA distribution. The right MCA is patent. The bilateral ACAs are patent. The right A1 segment is diminutive. There is no aneurysm. Posterior circulation: The posterior circulation is  suboptimally evaluated due to motion artifact. The V4 segments of the vertebral arteries are patent. The basilar artery is patent. There is multifocal moderate stenosis of the left P2 segment (9-256, 10-116, 10-123). The right PCA is patent.  There is no aneurysm. Venous sinuses: As permitted by contrast timing, patent. Anatomic variants: None. Review of the MIP images confirms the above findings IMPRESSION: 1. Occlusion of the left M1 segment with reconstitution of flow at the M2 segments with good collateral flow throughout the remainder of the MCA distribution. 2. No evidence of left MCA territory infarct at the time of imaging. Aspects is 10. 3. The CTA images are moderately motion degraded. There is no definite high-grade stenosis in the neck. 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 4. 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 Other foci of hypodensity in the supratentorial white matter likely reflects sequela of chronic white matter microangiopathy. 5. 1.1 cm soft tissue nodule in the medial right apex. Consider one of the following in 3 months for both low-risk and high-risk individuals: (a) repeat chest CT, (b) follow-up PET-CT, or (c) tissue sampling. This recommendation follows the consensus statement: Guidelines for Management of Incidental Pulmonary Nodules Detected on CT Images: From the Fleischner Society 2017; Radiology 2017; 284:228-243. The acute results were called by telephone at the time of interpretation on 05/25/2021 at 3:05 pm to provider Dr Cheral Marker , who verbally acknowledged these results. Electronically Signed   By: Valetta Mole M.D.   On: 05/25/2021 15:22   CT ANGIO HEAD NECK W WO CM (CODE STROKE)  Result Date: 05/25/2021 CLINICAL DATA:  Left-sided weakness EXAM: CT ANGIOGRAPHY HEAD AND NECK TECHNIQUE: Multidetector CT imaging of the head and neck was performed using the standard protocol during bolus administration of intravenous contrast.  Multiplanar CT image reconstructions and MIPs were obtained to evaluate the vascular anatomy. Carotid stenosis measurements (when applicable) are obtained utilizing NASCET criteria, using the distal internal carotid diameter as the denominator. CONTRAST:  49m OMNIPAQUE IOHEXOL 350 MG/ML SOLN COMPARISON:  None. FINDINGS: CT HEAD FINDINGS Brain: There is ill-defined hypodensity in the left centrum semiovale. The left basal ganglia and insular cortex are preserved. The cortex throughout the left MCA distribution is preserved. There is focal hypodensity in the right parietal lobe likely reflecting remote infarct. Additional foci of hypodensity in the bilateral cerebral hemispheres are nonspecific but may reflect sequela of chronic white matter microangiopathy. There is  no evidence of acute intracranial hemorrhage or extra-axial fluid collection. The ventricles are not enlarged. There is no mass lesion. There is no midline shift. Vascular: There is a dense MCA on the left. See below. Skull: Normal. Negative for fracture or focal lesion. Sinuses/orbits: The imaged paranasal sinuses are clear. The mastoid air cells are clear. Bilateral lens implants are in place. The globes and orbits are otherwise unremarkable. Review of the MIP images confirms the above findings CTA NECK FINDINGS The CTA images are moderately degraded by motion artifact. Aortic arch: Standard branching. Imaged portion shows no evidence of aneurysm or dissection. No significant stenosis of the major arch vessel origins. Right carotid system: The right carotid system is suboptimally evaluated particularly in the region of the carotid bulb due to motion artifact. The right internal carotid artery has a medialized course. Otherwise, there is no definite focal stenosis, occlusion, dissection, or aneurysm. Left carotid system: The left carotid system is suboptimally evaluated due to motion artifact particularly in the region of the carotid bulb. There is  calcified atherosclerotic plaque of the left carotid bulb without definite hemodynamically significant stenosis or occlusion. There is no definite dissection or aneurysm. Vertebral arteries: The vertebral arteries appear patent, without definite focal stenosis, dissection, occlusion, or aneurysm. Skeleton: There is mild multilevel degenerative change of the cervical spine. There is no acute osseous abnormality or aggressive osseous lesion. Other neck: The soft tissues are unremarkable. Upper chest: There is a 1.1 cm x 1.0 cm soft tissue nodule in the medial right apex. Review of the MIP images confirms the above findings CTA HEAD FINDINGS Anterior circulation: There is calcification of the bilateral cavernous ICAs without focal stenosis or occlusion. There is occlusion of the proximal left M1 segment with reconstitution of flow at the M2 segments. There appears to be good collateral flow throughout the peripheral MCA distribution. The right MCA is patent. The bilateral ACAs are patent. The right A1 segment is diminutive. There is no aneurysm. Posterior circulation: The posterior circulation is suboptimally evaluated due to motion artifact. The V4 segments of the vertebral arteries are patent. The basilar artery is patent. There is multifocal moderate stenosis of the left P2 segment (9-256, 10-116, 10-123). The right PCA is patent.  There is no aneurysm. Venous sinuses: As permitted by contrast timing, patent. Anatomic variants: None. Review of the MIP images confirms the above findings IMPRESSION: 1. Occlusion of the left M1 segment with reconstitution of flow at the M2 segments with good collateral flow throughout the remainder of the MCA distribution. 2. No evidence of left MCA territory infarct at the time of imaging. Aspects is 10. 3. The CTA images are moderately motion degraded. There is no definite high-grade stenosis in the neck. 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 4. Small  remote infarct in the right parietal lobe. Other foci of hypodensity in the supratentorial white matter likely reflects sequela of chronic white matter microangiopathy. 5. 1.1 cm soft tissue nodule in the medial right apex. Consider one of the following in 3 months for both low-risk and high-risk individuals: (a) repeat chest CT, (b) follow-up PET-CT, or (c) tissue sampling. This recommendation follows the consensus statement: Guidelines for Management of Incidental Pulmonary Nodules Detected on CT Images: From the Fleischner Society 2017; Radiology 2017; 284:228-243. The acute results were called by telephone at the time of interpretation on 05/25/2021 at 3:05 pm to provider Dr Cheral Marker , who verbally acknowledged these results. Electronically Signed   By:  Valetta Mole M.D.   On: 05/25/2021 15:22     PHYSICAL EXAM  Temp:  [98.2 F (36.8 C)-98.7 F (37.1 C)] 98.7 F (37.1 C) (09/26 0844) Pulse Rate:  [70-82] 74 (09/26 0844) Resp:  [17-19] 17 (09/26 0844) BP: (124-143)/(70-100) 138/84 (09/26 0844) SpO2:  [96 %-100 %] 98 % (09/26 0844)  General - Well nourished, well developed, elderly Caucasian lady in no apparent distress.  Ophthalmologic - fundi not visualized due to noncooperation.  Cardiovascular - irregularly irregular heart rate and rhythm.  Neuro - awake, alert, eyes open, orientated to place, age, months and people, but not to year.  Mild expressive aphasia occasional word finding difficulty and paraphasic errors, however, following all simple commands. Able to repeat and naming 2/4.  Mild dysarthria.  No gaze palsy, tracking bilaterally, blinking to visual threat bilaterally, PERRL.  Mild right facial droop. Tongue midline.  Left upper extremity 4/5 and right upper extremity no drift 4-/5.  Bilateral lower extremity proximal 3+/5 proximal and 4/5 distal.  Sensation symmetrical bilaterally, b/l FTN intact although slow on the right, gait not tested.    ASSESSMENT/PLAN Ms. KIONNA BRIER  is a 70 y.o. female with history of anxiety and HLD admitted for right-sided weakness, left gaze, aphasia and altered mental status. TNK given.    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 fib CT head no acute abnormality, old right parietal small infarct CT head and neck left M1 occlusion left M2 reconstitution.  Left P2 stenosis MRI left MCA infarcts more prominent at left BG and caudate head, right MCA and ACA punctate infarcts MRA left MCA patent now 2D Echo EF 50 to 55%, left atrial size normal LDL 109 HgbA1c 5.8 SCDs for VTE prophylaxis No antithrombotic prior to admission, now on Eliquis Ongoing aggressive stroke risk factor management Therapy recommendations: CIR Disposition: Pending  A. Fib New diagnosis EKG confirmed Cardiology consulted, appreciate assistance Rate controlled Eliquis started 05/29/2021 Hypotension, improved BP stable S/p bolus and one dose of albumin  BP < 180/105 Long term BP goal normotensive  Hyperlipidemia Home meds: None LDL 109, goal < 70 On Lipitor 40 Continue statin at discharge  Dysphagia Did not pass bedside swallow N.p.o. with sip for meds Pending MBS Speech on board  Other Stroke Risk Factors Advanced age Obesity, Body mass index is 36.33 kg/m.  Hx stroke/TIA on imaging  Other Active Problems CT head and neck showed right lung apex 1cm soft tissue nodule - need outpatient follow-up  Per Radiology Consider one of the following in 3 months for both low-risk and high-risk individuals: (a) repeat chest CT, (b) follow-up PET-CT, or (c) tissue sampling.  Hospital day # 6 Patient seems to be doing well and has made significant improvement.  Continue Eliquis for secondary stroke prevention for atrial fibrillation.  Continue ongoing therapies.  Hopefully transfer to rehab over the next few days.  Long discussion  with patient and  answered questions.  Discussed with rehab coordinator.  Greater than 50% time during the 25-minute visit was spent on counseling and coordination of care and discussion with patient and family about embolic stroke and atrial fibrillation and risk-benefit of anticoagulation and answering questions.  Antony Contras MD   To contact Stroke Continuity provider, please refer to [email or web address] After hours, contact General Neurology

## 2021-05-31 NOTE — Care Management Important Message (Signed)
Important Message  Patient Details  Name: Deborah Carey MRN: [id number] Date of Birth: 1950-10-22   Medicare Important Message Given:  Yes     Makyna Niehoff [address] 05/31/2021, 4:33 PM

## 2021-05-31 NOTE — Progress Notes (Addendum)
Progress Note  Patient Name: Deborah Carey Date of Encounter: 05/31/2021  [facility] Cardiologist: None Dr. Irish Lack  Subjective   Feeling well. No chest pain, sob or palpitations.    Inpatient Medications    Scheduled Meds:   stroke: mapping our early stages of recovery book   Does not apply Once   apixaban  5 mg Oral BID   atorvastatin  40 mg Oral Daily   influenza vaccine adjuvanted  0.5 mL Intramuscular Tomorrow-1000   insulin aspart  0-15 Units Subcutaneous Q4H   pantoprazole  40 mg Oral Daily   potassium chloride  20 mEq Oral Once   Continuous Infusions:  sodium chloride     PRN Meds: acetaminophen **OR** acetaminophen (TYLENOL) oral liquid 160 mg/5 mL **OR** [DISCONTINUED] acetaminophen, ondansetron (ZOFRAN) IV, senna-docusate   Vital Signs    Vitals:   05/30/21 1921 05/30/21 2336 05/31/21 0357 05/31/21 0844  BP: 134/85 139/70 (!) 143/100 138/84  Pulse:  70  74  Resp: 18 18  17   Temp: 98.2 F (36.8 C) 98.2 F (36.8 C) 98.4 F (36.9 C) 98.7 F (37.1 C)  TempSrc: Oral Oral Oral Oral  SpO2: 99% 96% 96% 98%  Weight:      Height:       No intake or output data in the 24 hours ending 05/31/21 1025 Last 3 Weights 05/25/2021 05/25/2021 05/29/2020  Weight (lbs) 211 lb 10.3 oz 214 lb 214 lb 6 oz  Weight (kg) 96 kg 97.07 kg 97.24 kg      Telemetry    Afib  70-120s- Personally Reviewed  ECG    N/A  Physical Exam   GEN: No acute distress.   Neck: No JVD Cardiac: IR IR , no murmurs, rubs, or gallops.  Respiratory: Clear to auscultation bilaterally. GI: Soft, nontender, non-distended  MS: No edema; No deformity. Neuro:  Nonfocal, right sided weakness Psych: Normal affect   Labs    High Sensitivity Troponin:   Recent Labs  Lab 05/25/21 1447  TROPONINIHS 7     Chemistry Recent Labs  Lab 05/25/21 1520 05/25/21 1907 05/25/21 2204 05/26/21 0429 05/27/21 0318 05/29/21 0617 05/30/21 0447 05/31/21 0724  NA 138  --    < > 137   < > 140 139  139  K 4.0  --    < > 3.9   < > 3.4* 3.6 3.7  CL 102  --   --  103   < > 106 104 106  CO2 26  --   --  23   < > 28 27 27   GLUCOSE 128*  --   --  146*   < > 106* 111* 106*  BUN 18  --   --  14   < > 10 11 12   CREATININE 0.83  --   --  0.78   < > 0.67 0.75 0.77  CALCIUM 9.4  --   --  8.9   < > 8.9 9.0 9.3  MG  --  2.1  --   --   --   --   --   --   PROT 7.3  --   --  6.1*  --   --   --   --   ALBUMIN 4.2  --   --  3.4*  --   --   --   --   AST 20  --   --  18  --   --   --   --  ALT 16  --   --  15  --   --   --   --   ALKPHOS 68  --   --  47  --   --   --   --   BILITOT 0.8  --   --  0.6  --   --   --   --   GFRNONAA >60  --   --  >60   < > >60 >60 >60  ANIONGAP 10  --   --  11   < > 6 8 6    < > = values in this interval not displayed.    Lipids  Recent Labs  Lab 05/26/21 0429  CHOL 171  TRIG 64  66  HDL 49  LDLCALC 109*  CHOLHDL 3.5    Hematology Recent Labs  Lab 05/29/21 0617 05/30/21 0447 05/31/21 0724  WBC 8.7 10.6* 9.3  RBC 3.81* 3.93 3.94  HGB 12.3 12.8 12.6  HCT 34.8* 35.2* 35.9*  MCV 91.3 89.6 91.1  MCH 32.3 32.6 32.0  MCHC 35.3 36.4* 35.1  RDW 11.9 11.9 11.9  PLT 193 207 210   Thyroid  Recent Labs  Lab 05/26/21 0429  TSH 4.101    BNPNo results for input(s): BNP, PROBNP in the last 168 hours.  DDimer No results for input(s): DDIMER in the last 168 hours.   Radiology    No results found.  Cardiac Studies   Echo 05/26/21 1. Left ventricular ejection fraction, by estimation, is 50 to 55%. The  left ventricle has low normal function. The left ventricle demonstrates  regional wall motion abnormalities - mild septal hypokinesis. There is  mild left ventricular hypertrophy of  the septal segment. Left ventricular diastolic parameters are  indeterminate.   2. Right ventricular systolic function is normal. The right ventricular  size is normal. There is normal pulmonary artery systolic pressure. The  estimated right ventricular systolic pressure is  91.4 mmHg.   3. The mitral valve is normal in structure. Trivial mitral valve  regurgitation. No evidence of mitral stenosis.   4. Tricuspid valve regurgitation is mild to moderate.   5. The aortic valve is grossly normal. Aortic valve regurgitation is not  visualized. No aortic stenosis is present.   6. The inferior vena cava is normal in size with <50% respiratory  variability, suggesting right atrial pressure of 8 mmHg.   Patient Profile     70 y.o. female with a PMH of OA, anxiety, and cataracts, who presented with an acute stroke now s/p mechanical thrombectomy who is being seen for the evaluation of atrial fibrillation at the request of Dr. Erlinda Hong.  Assessment & Plan    New onset afib - inially AFib RVR and up to 2 sec pause - HR fairly controlled at 70-80s, but goes to 120s-140s with movement>> ? Low dose rate control agent - Echo with LVEF of 50-55% - Continue Eliquis for anticoagulation  2. Suspected OSA: patient with complaints of snoring, daytime somnolence, insomnia, and PND. No prior sleep study. In light of #1, would benefit from a sleep study to evaluate for OSA - Will need to coordinate outpatient sleep study at follow-up visit    Likely follow up in AFib clinic going forward.   For questions or updates, please contact [place] Please consult [email or web address] for contact info under        SignedLeanor Kail, PA  05/31/2021, 10:25 AM      Patient seen and examined.  Agree with assessment and plan.  No chest pain or shortness of breath.  Patient remains in atrial fibrillation with ventricular rate ranging from 70-90 at there are periods of increased heart rhythm.  Patient is now on anticoagulation with Eliquis, day 5 following her stroke felt secondary to embolic etiology contributed by atrial fibrillation.  Will initiate low-dose rate control medication with metoprolol succinate 25 mg daily.  We will follow-up ECG in AM.  Echo shows EF 50 to 55% with mild  septal hypokinesis and mild left ventricular hypertrophy of the septal segment.  Agree with the need for outpatient sleep study to assess for obstructive sleep apnea which may have contributed to her atrial fibrillation development.   Troy Sine, MD, [facility] 05/31/2021 3:38 PM

## 2021-05-31 NOTE — Progress Notes (Signed)
Inpatient Rehab Admissions Coordinator:   I have insurance authorization for pt to admit to CIR, but I do not have a bed for her to admit today. I will follow for admission pending bed availability.   Shann Medal, PT, DPT Admissions Coordinator [phone] 05/31/21  11:28 AM

## 2021-06-01 ENCOUNTER — Inpatient Hospital Stay (HOSPITAL_COMMUNITY)
Admission: RE | Admit: 2021-06-01 | Discharge: 2021-06-07 | DRG: 057 | Disposition: A | Discharge status: Home / Self Care | Payer: Medicare PPO | Source: Intra-hospital | Attending: Physical Medicine and Rehabilitation | Admitting: Physical Medicine and Rehabilitation

## 2021-06-01 ENCOUNTER — Other Ambulatory Visit: Payer: Self-pay

## 2021-06-01 ENCOUNTER — Encounter (HOSPITAL_COMMUNITY): Payer: Self-pay | Admitting: Physical Medicine and Rehabilitation

## 2021-06-01 ENCOUNTER — Encounter: Payer: Medicare PPO | Admitting: Family Medicine

## 2021-06-01 DIAGNOSIS — Z91198 Patient's noncompliance with other medical treatment and regimen for other reason: Secondary | ICD-10-CM

## 2021-06-01 DIAGNOSIS — R7301 Impaired fasting glucose: Secondary | ICD-10-CM | POA: Diagnosis present

## 2021-06-01 DIAGNOSIS — I69392 Facial weakness following cerebral infarction: Secondary | ICD-10-CM | POA: Diagnosis not present

## 2021-06-01 DIAGNOSIS — G4700 Insomnia, unspecified: Secondary | ICD-10-CM | POA: Diagnosis present

## 2021-06-01 DIAGNOSIS — E785 Hyperlipidemia, unspecified: Secondary | ICD-10-CM | POA: Diagnosis not present

## 2021-06-01 DIAGNOSIS — I69320 Aphasia following cerebral infarction: Secondary | ICD-10-CM

## 2021-06-01 DIAGNOSIS — Z79899 Other long term (current) drug therapy: Secondary | ICD-10-CM | POA: Diagnosis not present

## 2021-06-01 DIAGNOSIS — K8020 Calculus of gallbladder without cholecystitis without obstruction: Secondary | ICD-10-CM

## 2021-06-01 DIAGNOSIS — I69391 Dysphagia following cerebral infarction: Secondary | ICD-10-CM | POA: Diagnosis not present

## 2021-06-01 DIAGNOSIS — R911 Solitary pulmonary nodule: Secondary | ICD-10-CM

## 2021-06-01 DIAGNOSIS — I69351 Hemiplegia and hemiparesis following cerebral infarction affecting right dominant side: Principal | ICD-10-CM

## 2021-06-01 DIAGNOSIS — I480 Paroxysmal atrial fibrillation: Secondary | ICD-10-CM | POA: Diagnosis present

## 2021-06-01 DIAGNOSIS — I4891 Unspecified atrial fibrillation: Secondary | ICD-10-CM | POA: Diagnosis present

## 2021-06-01 DIAGNOSIS — I63412 Cerebral infarction due to embolism of left middle cerebral artery: Secondary | ICD-10-CM | POA: Diagnosis not present

## 2021-06-01 DIAGNOSIS — Z8249 Family history of ischemic heart disease and other diseases of the circulatory system: Secondary | ICD-10-CM

## 2021-06-01 DIAGNOSIS — R7303 Prediabetes: Secondary | ICD-10-CM | POA: Diagnosis present

## 2021-06-01 DIAGNOSIS — R222 Localized swelling, mass and lump, trunk: Secondary | ICD-10-CM

## 2021-06-01 DIAGNOSIS — Z823 Family history of stroke: Secondary | ICD-10-CM

## 2021-06-01 DIAGNOSIS — R1313 Dysphagia, pharyngeal phase: Secondary | ICD-10-CM | POA: Diagnosis present

## 2021-06-01 DIAGNOSIS — I63512 Cerebral infarction due to unspecified occlusion or stenosis of left middle cerebral artery: Secondary | ICD-10-CM | POA: Diagnosis not present

## 2021-06-01 LAB — GLUCOSE, CAPILLARY
Glucose-Capillary: 109 mg/dL — ABNORMAL HIGH (ref 70–99)
Glucose-Capillary: 109 mg/dL — ABNORMAL HIGH (ref 70–99)
Glucose-Capillary: 138 mg/dL — ABNORMAL HIGH (ref 70–99)
Glucose-Capillary: 109 mg/dL — ABNORMAL HIGH (ref 70–99)

## 2021-06-01 MED ORDER — INSULIN ASPART 100 UNIT/ML IJ SOLN: 0.0000 [IU] | INTRAMUSCULAR | 11 refills | Status: DC

## 2021-06-01 MED ORDER — FLEET ENEMA 7-19 GM/118ML RE ENEM: 1.0000 | ENEMA | Freq: Once | RECTAL | Status: DC | PRN

## 2021-06-01 MED ORDER — SENNOSIDES-DOCUSATE SODIUM 8.6-50 MG PO TABS: 2.0000 | ORAL_TABLET | Freq: Every evening | ORAL | Status: DC | PRN

## 2021-06-01 MED ORDER — ACETAMINOPHEN 325 MG PO TABS: 650.0000 mg | ORAL_TABLET | ORAL | 0 refills | Status: DC | PRN

## 2021-06-01 MED ORDER — BISACODYL 10 MG RE SUPP
10.0000 mg | Freq: Every day | RECTAL | Status: DC | PRN
Start: 2021-06-01 — End: 2021-06-07

## 2021-06-01 MED ORDER — APIXABAN 5 MG PO TABS: 5.0000 mg | ORAL_TABLET | Freq: Two times a day (BID) | ORAL | 0 refills | Status: DC

## 2021-06-01 MED ORDER — ADULT MULTIVITAMIN W/MINERALS CH: ORAL_TABLET | Freq: Every day | ORAL | Status: DC

## 2021-06-01 MED ORDER — ATORVASTATIN CALCIUM 40 MG PO TABS
40.0000 mg | ORAL_TABLET | Freq: Every day | ORAL | Status: DC
  Administered 2021-06-02 – 2021-06-07 (×6): 40 mg via ORAL
  Filled 2021-06-01 (×6): qty 1

## 2021-06-01 MED ORDER — PANTOPRAZOLE SODIUM 40 MG PO TBEC: 40.0000 mg | DELAYED_RELEASE_TABLET | Freq: Every day | ORAL | 0 refills | Status: DC

## 2021-06-01 MED ORDER — ACETAMINOPHEN 325 MG PO TABS: 325.0000 mg | ORAL_TABLET | ORAL | Status: DC | PRN

## 2021-06-01 MED ORDER — METOPROLOL SUCCINATE ER 25 MG PO TB24
25.0000 mg | ORAL_TABLET | Freq: Every day | ORAL | Status: DC
  Administered 2021-06-02 – 2021-06-07 (×6): 25 mg via ORAL
  Filled 2021-06-01 (×6): qty 1

## 2021-06-01 MED ORDER — POLYETHYLENE GLYCOL 3350 17 G PO PACK: 17.0000 g | PACK | Freq: Every day | ORAL | Status: DC | PRN

## 2021-06-01 MED ORDER — DIPHENHYDRAMINE HCL 12.5 MG/5ML PO ELIX: 12.5000 mg | ORAL_SOLUTION | Freq: Four times a day (QID) | ORAL | Status: DC | PRN

## 2021-06-01 MED ORDER — ATORVASTATIN CALCIUM 40 MG PO TABS: 40.0000 mg | ORAL_TABLET | Freq: Every day | ORAL | 0 refills | Status: DC

## 2021-06-01 MED ORDER — GUAIFENESIN-DM 100-10 MG/5ML PO SYRP: 5.0000 mL | ORAL_SOLUTION | Freq: Four times a day (QID) | ORAL | Status: DC | PRN

## 2021-06-01 MED ORDER — METOPROLOL SUCCINATE ER 25 MG PO TB24: 25.0000 mg | ORAL_TABLET | Freq: Every day | ORAL | 0 refills | Status: DC

## 2021-06-01 MED ORDER — CALCIUM GLUCONATE 500 MG PO TABS: 1.0000 | ORAL_TABLET | Freq: Every day | ORAL | Status: DC

## 2021-06-01 MED ORDER — PANTOPRAZOLE SODIUM 40 MG PO TBEC
40.0000 mg | DELAYED_RELEASE_TABLET | Freq: Every day | ORAL | Status: DC
  Administered 2021-06-02 – 2021-06-07 (×6): 40 mg via ORAL
  Filled 2021-06-01 (×6): qty 1

## 2021-06-01 MED ORDER — PROCHLORPERAZINE 25 MG RE SUPP
12.5000 mg | Freq: Four times a day (QID) | RECTAL | Status: DC | PRN
Start: 2021-06-01 — End: 2021-06-07

## 2021-06-01 MED ORDER — PROCHLORPERAZINE MALEATE 5 MG PO TABS: 5.0000 mg | ORAL_TABLET | Freq: Four times a day (QID) | ORAL | Status: DC | PRN

## 2021-06-01 MED ORDER — PROCHLORPERAZINE EDISYLATE 10 MG/2ML IJ SOLN: 5.0000 mg | Freq: Four times a day (QID) | INTRAMUSCULAR | Status: DC | PRN

## 2021-06-01 MED ORDER — APIXABAN 5 MG PO TABS
5.0000 mg | ORAL_TABLET | Freq: Two times a day (BID) | ORAL | Status: DC
  Administered 2021-06-01 – 2021-06-07 (×12): 5 mg via ORAL
  Filled 2021-06-01 (×12): qty 1

## 2021-06-01 MED ORDER — ALUM & MAG HYDROXIDE-SIMETH 200-200-20 MG/5ML PO SUSP
30.0000 mL | ORAL | Status: DC | PRN
Start: 2021-06-01 — End: 2021-06-07

## 2021-06-01 MED ORDER — TRAZODONE HCL 50 MG PO TABS: 25.0000 mg | ORAL_TABLET | Freq: Every evening | ORAL | Status: DC | PRN

## 2021-06-01 MED ORDER — SENNOSIDES-DOCUSATE SODIUM 8.6-50 MG PO TABS: 1.0000 | ORAL_TABLET | Freq: Every evening | ORAL | 0 refills | Status: DC | PRN

## 2021-06-01 NOTE — TOC CAGE-AID Note (Signed)
Transition of Care [facility]) - CAGE-AID Screening   Patient Details  Name: Deborah Carey MRN: [id number] Date of Birth: [birth year]  Transition of Care [facility]) CM/SW Contact:    Deborah Carey C Carey, [place] Phone Number: 06/01/2021, 1:35 PM   Clinical Narrative: Pt participated in [employer].  Pt stated she does not use substance or ETOH.  Pt was not offered resources, due to no usage of substance or ETOH.     Deborah Carey, MSW, LCSW-A Pronouns:  She/Her/Hers [facility] HealthTransitions of Care Clinical Social Worker Direct Number:  [phone] [email or web address]   CAGE-AID Screening:    Have You Ever Felt You Ought to Cut Down on Your Drinking or Drug Use?: No Have People Annoyed You By [employer] Your Drinking Or Drug Use?: No Have You Felt Bad Or Guilty About Your Drinking Or Drug Use?: No Have You Ever Had a Drink or Used Drugs First Thing In The Morning to Steady Your Nerves or to Get Rid of a Hangover?: No CAGE-AID Score: 0  Substance Abuse Education Offered: No

## 2021-06-01 NOTE — Progress Notes (Addendum)
Inpatient Rehab Admissions Coordinator:   I have a bed for this patient to admit to CIR today.  Awaiting confirmation from Stroke team that she is ready. I've let family and TOC know.   1241: discharge order in, will plan admit to CIR today.   Shann Medal, PT, DPT Admissions Coordinator [phone] 06/01/21  10:00 AM

## 2021-06-01 NOTE — Progress Notes (Signed)
Speech Language Pathology Treatment: Dysphagia;Cognitive-Linquistic  Patient Details Name: Deborah Carey MRN: [id number] DOB: Feb 25, 1951 Today's Date: 06/01/2021 Time: 4098-1191 SLP Time Calculation (min) (ACUTE ONLY): 31 min  Assessment / Plan / Recommendation Clinical Impression  Pt alert and upright in chair this am and independently completed oral care prior to upgraded PO trials. Ice chips and thin liquids via cup and straw consumed, with pt exhibiting x1 throat clear with larger volumes via straw. Regular textured solid adequately masticated and cleared without difficulty. Given clinical presentation this date, recommend repeat instrumental assessment to determine swallow physiology and readiness to advance from thickened liquids. Will schedule tentatively for tomorrow.  Pt's overall verbal expression much improved from initial evaluation. She engaged in simple conversation without incidence of paraphasia, though word finding persists, which resulted in somewhat halting speech. Responsive naming task performed with 100% accuracy independently this date. Additionally, she described 2 given words/objects to clinician using circumlocution strategy given min verbal/question cues. Complex y/n questions answered with 100% accuracy and pt's overall comprehension of open ended questions in conversation was [facility]. Pt would benefit from continued SLP services for expressive aphasia.    HPI HPI: 70 y.o. F with PMH of cataracts, anxiety, osteoarthritis who was at the cancer center with her husband when she started having right-sided weakness with left eye deviation and aphasia. CTA with left M1/MCA occlusion and she was taken emergently to IR for mechanical thrombectomy which resulted in complete recanalization. 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 DYs 3, NTL recommended.      SLP Plan  Continue with current plan of care;MBS      Recommendations  for follow up therapy are one component of a multi-disciplinary discharge planning process, led by the attending physician.  Recommendations may be updated based on patient status, additional functional criteria and insurance authorization.    Recommendations  Diet recommendations: Regular;Nectar-thick liquid Liquids provided via: Cup;Straw Medication Administration: Whole meds with puree Supervision: Patient able to self feed;Full supervision/cueing for compensatory strategies Compensations: Small sips/bites;Slow rate;Minimize environmental distractions Postural Changes and/or Swallow Maneuvers: Seated upright 90 degrees                Oral Care Recommendations: Oral care BID Follow up Recommendations: Inpatient Rehab SLP Visit Diagnosis: Aphasia (R47.01);Dysphagia, oropharyngeal phase (R13.12) Plan: Continue with current plan of care;MBS       [place], [place], [place] Office Number: [phone]  [name]  06/01/2021, 10:24 AM

## 2021-06-01 NOTE — H&P (Signed)
Physical Medicine and Rehabilitation Admission H&P    CC: Stroke with functional deficits.    HPI: Deborah Carey. Jasinski is a 70 year old female with history of OA, anxiety who was admitted to [facility] 05/25/21 with acute on set of right sided weakness, left ward gaze and inability to speak. CTA head showed occlusion of L-M1 MCA with large L-MCA ischemia as well as incidental 1.1 cm soft tissue nodule medial right apex --CT chest recommended in 3 months.  She developed acute respiratory failure requiring intubation prior to transfer to [facility] for intervention. She received TNK and underwent cerebral angio with thrombectomy of proximal L-M1/MCA occlusion with completed recanalization. Post procedure had episode of PAF, hypotension treated with IVF/albumin as well as episode of emesis/abdominal pain. CT abdomen/pelvis showed cholelithiasis without cystitis and scattered colonic diverticula without acute diverticulitis. 2D echo showed EF 50-55% showed mild to moderate TVR and mild septal hypokinesis.   MRA brain showed patency of L-MCA branches and stenotic disease both PCA P2 segments. MRI brain showed Left MCA infarct prominent in L-putamen and caudate head, with minimal petechial blood products and mild swelling, three punctate infarct in right frontal lobe and probable microemboli in R-ACA as well as  old right parietal cortical/subcortical infarcts. Dr. Irish Lack consulted for input and recommended starting [facility] once cleared by neuro. MBS done revealing delay in swallow with mild to moderate pharyngeal dysphagia--->on D3, nectar liquids. Dr. Erlinda Hong felt that stroke embolic due to newly diagnosed A fib. She was started on ASA 325 mg and transitioned to [place] on 09/24.  She is showing improvement in verbal output but continues to be limited by STM deficits, RLE weakness, cognitive impairments with  BLE>BUE weakness as well as dysphagia with noncompliance with thickened liquids. CIR recommended due to functional decline.    Denies pain; on D3 nectar thick liquids.  LBM this AM.      Review of Systems  Constitutional:  Negative for fever and malaise/fatigue.  HENT:  Negative for hearing loss.   Eyes:  Negative for blurred vision and double vision.  Respiratory:  Negative for cough and shortness of breath.   Cardiovascular:  Negative for palpitations and leg swelling.  Gastrointestinal:  Negative for constipation (had BM few minutes ago), heartburn and nausea.  Genitourinary:  Negative for dysuria and urgency.  Musculoskeletal:  Negative for back pain, joint pain, myalgias and neck pain.  Neurological:  Positive for focal weakness. Negative for dizziness and headaches.  Psychiatric/Behavioral:  The patient does not have insomnia.   All other systems reviewed and are negative.   Past Medical History:  Diagnosis Date   Allergy    allergic rhinitis   Anxiety    Cataract    removed both eyes    Infertility, female    Osteoarthritis of both knees    OA    PONV (postoperative nausea and vomiting)    Post-menopausal bleeding    neg endo bx.    Past Surgical History:  Procedure Laterality Date   cataract surgery  2011   CESAREAN SECTION     times 2   COLONOSCOPY  2010   hems    ENDOMETRIAL BIOPSY     neg for CA cells   FACIAL COSMETIC SURGERY  2006   face lift   FOOT SURGERY  1998   rt heel spur removal   IR CT HEAD LTD  05/25/2021   IR PERCUTANEOUS ART THROMBECTOMY/INFUSION INTRACRANIAL INC DIAG ANGIO  05/25/2021   IR US GUIDE  VASC ACCESS RIGHT  05/25/2021   RADIOLOGY WITH ANESTHESIA N/A 05/25/2021   Procedure: IR WITH ANESTHESIA;  Surgeon: Aletta Edouard, MD;  Location: [facility];  Service: Radiology;  Laterality: N/A;   REFRACTIVE SURGERY  08/1999    Family History  Problem Relation Age of Onset   COPD Brother    Stroke Mother    Hypertension Mother    Osteoporosis Mother    Alzheimer's disease Father    Hypertension Father    Osteoporosis Father    Depression Sister     Hypertension Sister    Osteoporosis Sister    Thyroid disease Sister    Hypothyroidism Sister    Parkinson's disease Sister    Osteoporosis Sister    Hypothyroidism Sister    Breast cancer Neg Hx    Colon polyps Neg Hx    Colon cancer Neg Hx    Esophageal cancer Neg Hx    Rectal cancer Neg Hx    Stomach cancer Neg Hx     Social History:  Married. Retired Oncologist. She reports that she has never smoked. She has never used smokeless tobacco. She reports that she drinks alcohol on rare occasions and does not use drugs.   Allergies: No Known Allergies   Medications Prior to Admission  Medication Sig Dispense Refill   albuterol (PROVENTIL HFA;VENTOLIN HFA) 108 (90 Base) MCG/ACT inhaler Inhale 1-2 puffs into the lungs every 6 (six) hours as needed for wheezing (or for cough). 1 Inhaler 1   calcium gluconate 500 MG tablet Take 1 tablet by mouth daily.     cetirizine (ZYRTEC) 10 MG tablet Take 10 mg by mouth daily as needed for allergies.     Cholecalciferol (VITAMIN D3) 125 MCG (5000 UT) CAPS Take 5,000 Units by mouth daily.     fluticasone (FLONASE) 50 MCG/ACT nasal spray Place 2 sprays into both nostrils as needed. (Patient taking differently: Place 2 sprays into both nostrils as needed for allergies.) 16 g 1   Multiple Vitamins-Minerals (MULTIVITAMIN PO) Take 1 tablet by mouth daily.     naproxen sodium (ANAPROX) 220 MG tablet Take 220 mg by mouth daily as needed (pain).     FLUoxetine (PROZAC) 40 MG capsule Take 40 mg by mouth daily. (Patient not taking: Reported on 05/27/2021)      Drug Regimen Review  Drug regimen was reviewed and remains appropriate with no significant issues identified  Home: Home Living Family/patient expects to be discharged to:: Private residence Living Arrangements: Spouse/significant other Available Help at Discharge: Family, Available 24 hours/day Type of Home: House Home Access: Stairs to enter [employer] of Steps: 4 Entrance  Stairs-Rails: None Home Layout: Two level, Able to live on main level with bedroom/bathroom Bathroom Shower/Tub: Multimedia programmer: Standard Home Equipment: None   Functional History: Prior Function Level of Independence: Independent Comments: driving  Functional Status:  Mobility: Bed Mobility Overal bed mobility: Modified Independent Bed Mobility: Supine to Sit Supine to sit: Min assist, HOB elevated Sit to supine: Max assist General bed mobility comments: Increased time but pt was able to manage exit and entrance without difficulty. Transfers Overall transfer level: Needs assistance Equipment used: None Transfers: Sit to/from Stand Sit to Stand: Supervision Stand pivot transfers: Min assist General transfer comment: Pt demonstrated proper hand placement on seated surface for safety. No assist required however supervision provided for safety. Ambulation/Gait Ambulation/Gait assistance: Min guard Gait Distance (Feet): 200 Feet Assistive device: Rolling walker (2 wheeled) Gait Pattern/deviations: Step-through pattern, Decreased step  length - right, Decreased dorsiflexion - right General Gait Details: Decreased foot clearance on the R but overall good navigating in the hallway. She was able to direction find despite several turns in the unit. Gait velocity: Decreased Gait velocity interpretation: 1.31 - 2.62 ft/sec, indicative of limited community ambulator Stairs: Yes Stairs assistance: Min guard Stair Management: Two rails, Alternating pattern, Forwards Number of Stairs: 5 General stair comments: Practice stairs in the rehab gym as part of the DGI    ADL: ADL Overall ADL's : Needs assistance/impaired Grooming: Moderate assistance Upper Body Bathing: Minimal assistance, Sitting Lower Body Bathing: Moderate assistance, Sit to/from stand Upper Body Dressing : Moderate assistance, Sitting Lower Body Dressing: Maximal assistance, Sit to/from stand Toilet  Transfer: Minimal assistance, Ambulation, RW, Regular Toilet, Grab bars Toilet Transfer Details (indicate cue type and reason): min A for verbal cues for RW management and safety Toileting- Clothing Manipulation and Hygiene: Min guard, Sitting/lateral lean Toileting - Clothing Manipulation Details (indicate cue type and reason): has not voided since 4am per nursing - unsure of awareness Functional mobility during ADLs: Min guard, Rolling walker General ADL Comments: close min guard for safety; pt continues to have R bias and some R weakness that cause safety concerns with ambulation. Pt managed 3 steps this session with a reciprocal gait pattern  Cognition: Cognition Overall Cognitive Status: Impaired/Different from baseline Arousal/Alertness: Awake/alert Orientation Level: Oriented X4 Year: 2022 Month: September Day of Week: Correct Awareness: Impaired Awareness Impairment: Intellectual impairment Cognition Arousal/Alertness: Awake/alert Behavior During Therapy: Flat affect Overall Cognitive Status: Impaired/Different from baseline Area of Impairment: Memory Orientation Level: Disoriented to, Time, Place (difficulty with expressive language) Current Attention Level: Sustained Memory: Decreased short-term memory Following Commands: Follows one step commands consistently, Follows multi-step commands inconsistently Safety/Judgement: Decreased awareness of safety Awareness: Emergent Problem Solving: Slow processing, Difficulty sequencing, Requires verbal cues, Requires tactile cues General Comments: Cognition improved overall however still with flat affect. Pt went to the sink to wash face, however once there she washed her hands instead and moved on to ambulating in the hallway without washing face.   Blood pressure 123/75, pulse 65, temperature 98.3 F (36.8 C), temperature source Oral, resp. rate 17, height 5\' 4"  (1.626 m), weight 96 kg, last menstrual period 09/05/2008, SpO2 98  %. Physical Exam Vitals and nursing note reviewed. Exam conducted with a chaperone present.  Constitutional:      Appearance: Normal appearance. She is obese.     Comments: Well healed old scar in hairline.  Has pitcher of water and multiple water cups in room--educated pt/husband on need for thickened liquids.  Sitting up in a bedside chair; husband in room, NAD   HENT:     Head: Normocephalic and atraumatic.     Comments: R facial droop; R tongue deviation;     Right Ear: External ear normal.     Left Ear: External ear normal.     Nose: Nose normal. No congestion.     Mouth/Throat:     Mouth: Mucous membranes are dry.     Pharynx: Oropharynx is clear. No oropharyngeal exudate.  Eyes:     General:        Right eye: No discharge.        Left eye: No discharge.     Extraocular Movements: Extraocular movements intact.     Comments: No nystagmus  Cardiovascular:     Rate and Rhythm: Normal rate.     Heart sounds: Normal heart sounds. No murmur heard.  No gallop.  Pulmonary:     Comments: CTA B/L- no W/R/R- good air movement  Abdominal:     Comments: Soft, NT, ND, (+)BS   Musculoskeletal:     Cervical back: Normal range of motion and neck supple.     Comments: RUE- biceps, triceps, WE, grip and finger abd 4+/5 LUE- 5/5 RLE HF, KE, KF, DF and PF 4+/5 LLE- 5/5   Skin:    General: Skin is warm and dry.     Comments: Big bruise; RUE upper arm; and L hand  Neurological:     Mental Status: She is alert and oriented to person, place, and time.     Comments: Able to answer biographic questions without difficulty  Slightly slowed processing-   Psychiatric:     Comments: Appropriate, but flat    Results for orders placed or performed during the hospital encounter of 05/25/21 (from the past 48 hour(s))  Glucose, capillary     Status: Abnormal   Collection Time: 05/30/21 12:58 PM  Result Value Ref Range   Glucose-Capillary 172 (H) 70 - 99 mg/dL    Comment: Glucose reference  range applies only to samples taken after fasting for at least 8 hours.   Comment 1 Notify RN    Comment 2 Document in Chart   Glucose, capillary     Status: Abnormal   Collection Time: 05/30/21  4:42 PM  Result Value Ref Range   Glucose-Capillary 121 (H) 70 - 99 mg/dL    Comment: Glucose reference range applies only to samples taken after fasting for at least 8 hours.  Glucose, capillary     Status: Abnormal   Collection Time: 05/30/21  7:44 PM  Result Value Ref Range   Glucose-Capillary 127 (H) 70 - 99 mg/dL    Comment: Glucose reference range applies only to samples taken after fasting for at least 8 hours.   Comment 1 Notify RN    Comment 2 Document in Chart   Glucose, capillary     Status: None   Collection Time: 05/30/21 11:33 PM  Result Value Ref Range   Glucose-Capillary 99 70 - 99 mg/dL    Comment: Glucose reference range applies only to samples taken after fasting for at least 8 hours.   Comment 1 Notify RN    Comment 2 Document in Chart   Glucose, capillary     Status: Abnormal   Collection Time: 05/31/21  3:59 AM  Result Value Ref Range   Glucose-Capillary 120 (H) 70 - 99 mg/dL    Comment: Glucose reference range applies only to samples taken after fasting for at least 8 hours.   Comment 1 Notify RN    Comment 2 Document in Chart   CBC     Status: Abnormal   Collection Time: 05/31/21  7:24 AM  Result Value Ref Range   WBC 9.3 4.0 - 10.5 K/uL   RBC 3.94 3.87 - 5.11 MIL/uL   Hemoglobin 12.6 12.0 - 15.0 g/dL   HCT 35.9 (L) 36.0 - 46.0 %   MCV 91.1 80.0 - 100.0 fL   MCH 32.0 26.0 - 34.0 pg   MCHC 35.1 30.0 - 36.0 g/dL   RDW 11.9 11.5 - 15.5 %   Platelets 210 150 - 400 K/uL   nRBC 0.0 0.0 - 0.2 %    Comment: Performed at [facility], [place] [address], [place], [place] [address]  Basic metabolic panel     Status: Abnormal   Collection Time:  05/31/21  7:24 AM  Result Value Ref Range   Sodium 139 135 - 145 mmol/L   Potassium 3.7 3.5 - 5.1 mmol/L   Chloride 106  98 - 111 mmol/L   CO2 27 22 - 32 mmol/L   Glucose, Bld 106 (H) 70 - 99 mg/dL    Comment: Glucose reference range applies only to samples taken after fasting for at least 8 hours.   BUN 12 8 - 23 mg/dL   Creatinine, Ser 0.77 0.44 - 1.00 mg/dL   Calcium 9.3 8.9 - 10.3 mg/dL   GFR, Estimated >60 >60 mL/min    Comment: (NOTE) Calculated using the CKD-EPI Creatinine Equation (2021)    Anion gap 6 5 - 15    Comment: Performed at [facility] [address], [place], [place] [address]  Glucose, capillary     Status: Abnormal   Collection Time: 05/31/21  7:46 AM  Result Value Ref Range   Glucose-Capillary 113 (H) 70 - 99 mg/dL    Comment: Glucose reference range applies only to samples taken after fasting for at least 8 hours.  Glucose, capillary     Status: Abnormal   Collection Time: 05/31/21 11:53 AM  Result Value Ref Range   Glucose-Capillary 122 (H) 70 - 99 mg/dL    Comment: Glucose reference range applies only to samples taken after fasting for at least 8 hours.  Glucose, capillary     Status: Abnormal   Collection Time: 05/31/21  4:19 PM  Result Value Ref Range   Glucose-Capillary 107 (H) 70 - 99 mg/dL    Comment: Glucose reference range applies only to samples taken after fasting for at least 8 hours.   Comment 1 Notify RN    Comment 2 Document in Chart   Glucose, capillary     Status: Abnormal   Collection Time: 05/31/21  7:45 PM  Result Value Ref Range   Glucose-Capillary 155 (H) 70 - 99 mg/dL    Comment: Glucose reference range applies only to samples taken after fasting for at least 8 hours.  Glucose, capillary     Status: None   Collection Time: 05/31/21 11:51 PM  Result Value Ref Range   Glucose-Capillary 95 70 - 99 mg/dL    Comment: Glucose reference range applies only to samples taken after fasting for at least 8 hours.  Glucose, capillary     Status: Abnormal   Collection Time: 06/01/21  3:54 AM  Result Value Ref Range   Glucose-Capillary 109 (H) 70 - 99  mg/dL    Comment: Glucose reference range applies only to samples taken after fasting for at least 8 hours.  Glucose, capillary     Status: Abnormal   Collection Time: 06/01/21  8:00 AM  Result Value Ref Range   Glucose-Capillary 109 (H) 70 - 99 mg/dL    Comment: Glucose reference range applies only to samples taken after fasting for at least 8 hours.   No results found.     Medical Problem List and Plan: 1.  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  Antithrombotics: -DVT/anticoagulation:  Pharmaceutical: Other (comment)--Eliquis.   -antiplatelet therapy: N/A 3. Pain Management: Tylenol prn.  4. Mood: LCSW to follow for evaluation and support.   -antipsychotic agents: N/A 5. Neuropsych: This patient is capable of making decisions on her own behalf. 6. Skin/Wound Care: Routine pressure relief measures.  7. Fluids/Electrolytes/Nutrition: Monitor I/O. Check lytes in am. 8. A fib: Monitor HR TID--continue metoprolol and Eliquis. 9.  Dyslipidemia: Now on Lipitor.  10. Dysphagia: Continue DIII w/nectar liquids and supervision for safety.  11. Impaired fasting sugars: Hgb A1C-5.8. Add carb modifications to diet.       Bary Leriche, PA-C 06/01/2021   I have personally performed a face to face diagnostic evaluation of this patient and formulated the key components of the plan.  Additionally, I have personally reviewed laboratory data, imaging studies, as well as relevant notes and concur with the physician assistant's documentation above.   The patient's status has not changed from the original H&P.  Any changes in documentation from the acute care chart have been noted above.

## 2021-06-01 NOTE — Progress Notes (Signed)
Courtney Heys, MD   Physician  Physical Medicine and Rehabilitation  PMR Pre-admission     Signed  Date of Service:  06/01/2021 12:42 PM       Related encounter: ED to Hosp-Admission (Current) from 05/25/2021 in [facility]       Signed                                                                                                                                                                                                                                                                                                                                                                                                                                                                  PMR Admission Coordinator Pre-Admission Assessment   Patient: Deborah Carey is an 70 y.o., female MRN: [id number] DOB: 1950/12/15 Height: _0  (162.6 cm) Weight: 96 kg   Insurance Information HMO:     PPO: yes     PCP:      IPA:      80/20:      OTHER:  PRIMARY: [employer]      Policy#: [id number]      Subscriber: pt CM Name: Maudie Mercury      Phone#: [phone] ext [phone]     Fax#:  [phone] Pre-Cert#: [id number] Josem Kaufmann for CIR given by Maudie Mercury with [employer] with updates due to Fraser Din E at fax listed above on 10/3 Fraser Din ext [phone])      Employer:  Benefits:  Phone #: [phone]     Name:  Eff. Date: 09/06/19     Deduct: $0      Out of Pocket Max: $4000 ($0 met)      Life Max:  CIR: $160/day for days 1-10      SNF: 100% Outpatient:      Co-Pay: $20 Home Health: 100%      Co-Pay:  DME: 80%     Co-Ins: 20% Providers:  SECONDARY:       Policy#:      Phone#:    Development worker, community:       Phone#:    The Therapist, art Information Summary" for patients in Inpatient Rehabilitation Facilities with attached "Privacy Act [place] Records" was  provided and verbally reviewed with: Pt/family   Emergency Contact Information Contact Information       Name Relation Home Work Mobile    Biddy,Mike Spouse [phone]   [phone]    Malva Limes Daughter     [phone]           Current Medical History  Patient Admitting Diagnosis: CVA (L MCA, R MCA/ACA)   History of Present Illness: Pt is a 70 y/o female with PMH of OA, anxiety, and cataracts who was admitted to [facility] from [facility] on 9/20 for R sided weakness, L gaze, and acute onset of aphasia.  CT at [facility] revealed occlusion of L M1 MCA segment with a large territoy L MCA ischemic infarction.  TNK was adminitstered at [facility] and she was transferred to [facility] for IR intervention.  Prior to transfer she developed acute respiratory failure requiring intubation in the ED.  On arrival to [facility], pt was intubated and sedated.  She was found to have afib with HR in the 160s on monitor.  No known history of afib.  She was extubated on 9/21.  Cleviprex for HTN initially.  2D echo normal, A1c 5.8, MRI revealed L MCA infarct, R MCA and ACA punctate infarcts.  Stroke service recommends Eliquis for stroke prevention.  Cardiology consulted due to new onset afib and recommended f/u in the outpatient afib clinic.  Currently she is rate controlled on no medication.  Therapy evaluations were completed and pt was recommended for CIR.    Complete NIHSS TOTAL: 2   [facility]'s [facility] record from [facility] has been reviewed by the rehabilitation admission coordinator and physician.   Past Medical History      Past Medical History:  Diagnosis Date   Allergy      allergic rhinitis   Anxiety     Cataract      removed both eyes    Infertility, female     Osteoarthritis of both knees      OA    PONV (postoperative nausea and vomiting)     Post-menopausal bleeding      neg endo bx.      Has the patient had major surgery during 100 days prior to admission? No   Family History   family history  includes Alzheimer's disease in her father; COPD in her brother; Depression in her sister; Hypertension in her father, mother, and sister; Hypothyroidism in her sister and sister; Osteoporosis in her father, mother, sister, and sister; Parkinson's disease in her sister; Stroke in her mother; Thyroid disease in her  sister.   Current Medications   Current Facility-Administered Medications:     stroke: mapping our early stages of recovery book, , Does not apply, Once, Toberman, Stevi W, NP   0.9 %  sodium chloride infusion, 250 mL, Intravenous, Continuous, Hunsucker, Bonna Gains, MD   acetaminophen (TYLENOL) tablet 650 mg, 650 mg, Oral, Q4H PRN **OR** acetaminophen (TYLENOL) 160 MG/5ML solution 650 mg, 650 mg, Per Tube, Q4H PRN **OR** [DISCONTINUED] acetaminophen (TYLENOL) suppository 650 mg, 650 mg, Rectal, Q4H PRN, Ebbie Latus, Stevi W, NP   apixaban (ELIQUIS) tablet 5 mg, 5 mg, Oral, BID, Paytes, Austin A, RPH, 5 mg at 06/01/21 1043   atorvastatin (LIPITOR) tablet 40 mg, 40 mg, Oral, Daily, Kroeger, Krista M., PA-C, 40 mg at 06/01/21 1043   influenza vaccine adjuvanted (FLUAD) injection 0.5 mL, 0.5 mL, Intramuscular, Tomorrow-1000, Xu, Jindong, MD   insulin aspart (novoLOG) injection 0-15 Units, 0-15 Units, Subcutaneous, Q4H, Hunsucker, Bonna Gains, MD, 2 Units at 06/01/21 1241   metoprolol succinate (TOPROL-XL) 24 hr tablet 25 mg, 25 mg, Oral, Daily, Shelva Majestic A, MD, 25 mg at 06/01/21 1043   ondansetron (ZOFRAN) injection 4 mg, 4 mg, Intravenous, Q6H PRN, Jacky Kindle, MD, 4 mg at 05/26/21 0815   pantoprazole (PROTONIX) EC tablet 40 mg, 40 mg, Oral, Daily, Rosalin Hawking, MD, 40 mg at 06/01/21 1043   senna-docusate (Senokot-S) tablet 1 tablet, 1 tablet, Oral, QHS PRN, Rikki Spearing, NP   Patients Current Diet:  Diet Order                  DIET DYS 3 Room service appropriate? Yes with Assist; Fluid consistency: Nectar Thick  Diet effective now                         Precautions /  Restrictions Precautions Precautions: Fall Precaution Comments: right sided weakness Restrictions Weight Bearing Restrictions: No    Has the patient had 2 or more falls or a fall with injury in the past year? No   Prior Activity Level Community (5-7x/wk): independent prior to admit, driving, no DME used   Prior Functional Level Self Care: Did the patient need help bathing, dressing, using the toilet or eating? Independent   Indoor Mobility: Did the patient need assistance with walking from room to room (with or without device)? Independent   Stairs: Did the patient need assistance with internal or external stairs (with or without device)? Independent   Functional Cognition: Did the patient need help planning regular tasks such as shopping or remembering to take medications? Independent   Patient Information Are you of Hispanic, Latino/a,or Spanish origin?: A. No, not of Hispanic, Latino/a, or Spanish origin, X. Patient unable to respond What is your race?: X. Patient unable to respond, A. White Do you need or want an interpreter to communicate with a doctor or health care staff?: 0. No   Patient's Response To:  Health Literacy and Transportation Is the patient able to respond to health literacy and transportation needs?: No Health Literacy - How often do you need to have someone help you when you read instructions, pamphlets, or other written material from your doctor or pharmacy?: Never In the past 12 months, has lack of transportation kept you from medical appointments or from getting medications?: No In the past 12 months, has lack of transportation kept you from meetings, work, or from getting things needed for daily living?: No   Development worker, international aid / Equipment Home Equipment: None  Prior Device Use: Indicate devices/aids used by the patient prior to current illness, exacerbation or injury? None of the above   Current Functional Level Cognition   Arousal/Alertness:  Awake/alert Overall Cognitive Status: Impaired/Different from baseline Current Attention Level: Sustained Orientation Level: Oriented X4 Following Commands: Follows one step commands consistently, Follows multi-step commands inconsistently Safety/Judgement: Decreased awareness of safety General Comments: Cognition improved overall however still with flat affect. Pt went to the sink to wash face, however once there she washed her hands instead and moved on to ambulating in the hallway without washing face. Awareness: Impaired Awareness Impairment: Intellectual impairment    Extremity Assessment (includes Sensation/Coordination)   Upper Extremity Assessment: RUE deficits/detail RUE Deficits / Details: R grip < L RUE Sensation: WNL RUE Coordination: decreased fine motor  Lower Extremity Assessment: Defer to PT evaluation RLE Deficits / Details: grossly 4-/5     ADLs   Overall ADL's : Needs assistance/impaired Grooming: Moderate assistance Upper Body Bathing: Minimal assistance, Sitting Lower Body Bathing: Moderate assistance, Sit to/from stand Upper Body Dressing : Moderate assistance, Sitting Lower Body Dressing: Maximal assistance, Sit to/from stand Toilet Transfer: Minimal assistance, Ambulation, RW, Regular Toilet, Grab bars Toilet Transfer Details (indicate cue type and reason): min A for verbal cues for RW management and safety Toileting- Clothing Manipulation and Hygiene: Min guard, Sitting/lateral lean Toileting - Clothing Manipulation Details (indicate cue type and reason): has not voided since 4am per nursing - unsure of awareness Functional mobility during ADLs: Min guard, Rolling walker General ADL Comments: close min guard for safety; pt continues to have R bias and some R weakness that cause safety concerns with ambulation. Pt managed 3 steps this session with a reciprocal gait pattern     Mobility   Overal bed mobility: Modified Independent Bed Mobility: Supine to  Sit Supine to sit: Min assist, HOB elevated Sit to supine: Max assist General bed mobility comments: Increased time but pt was able to manage exit and entrance without difficulty.     Transfers   Overall transfer level: Needs assistance Equipment used: None Transfers: Sit to/from Stand Sit to Stand: Supervision Stand pivot transfers: Min assist General transfer comment: Pt demonstrated proper hand placement on seated surface for safety. No assist required however supervision provided for safety.     Ambulation / Gait / Stairs / Wheelchair Mobility   Ambulation/Gait Ambulation/Gait assistance: Counsellor (Feet): 200 Feet Assistive device: Rolling walker (2 wheeled) Gait Pattern/deviations: Step-through pattern, Decreased step length - right, Decreased dorsiflexion - right General Gait Details: Decreased foot clearance on the R but overall good navigating in the hallway. She was able to direction find despite several turns in the unit. Gait velocity: Decreased Gait velocity interpretation: 1.31 - 2.62 ft/sec, indicative of limited community ambulator Stairs: Yes Stairs assistance: Min guard Stair Management: Two rails, Alternating pattern, Forwards Number of Stairs: 5 General stair comments: Practice stairs in the rehab gym as part of the DGI     Posture / Balance Dynamic Sitting Balance Sitting balance - Comments: sitting EOB without LOB donned second gown and fixing hair with A Balance Overall balance assessment: Needs assistance Sitting-balance support: Feet supported Sitting balance-Leahy Scale: Good Sitting balance - Comments: sitting EOB without LOB donned second gown and fixing hair with A Standing balance support: Single extremity supported, During functional activity Standing balance-Leahy Scale: Fair Standing balance comment: reliant on external support High Level Balance Comments: performed alternate step taps forward with R LE buckling at times stepping  with  and attempted marching in place with A due to R LE weakness Standardized Balance Assessment Standardized Balance Assessment : Dynamic Gait Index Dynamic Gait Index Level Surface: Mild Impairment Change in Gait Speed: Mild Impairment Gait with Horizontal Head Turns: Normal Gait with Vertical Head Turns: Mild Impairment Gait and Pivot Turn: Normal Step Over Obstacle: Mild Impairment Step Around Obstacles: Mild Impairment Steps: Mild Impairment Total Score: 18     Special needs/care consideration Diabetic management pre diabetes    Previous Home Environment (from acute therapy documentation) Living Arrangements: Spouse/significant other Available Help at Discharge: Family, Available 24 hours/day Type of Home: House Home Layout: Two level, Able to live on main level with bedroom/bathroom Home Access: Stairs to enter Entrance Stairs-Rails: None Entrance Stairs-Number of Steps: 4 Bathroom Shower/Tub: Multimedia programmer: Standard   Discharge Living Setting Plans for Discharge Living Setting: Patient's home, Lives with (comment) (spouse) Type of Home at Discharge: House Discharge Home Layout: Able to live on main level with bedroom/bathroom Discharge Home Access: Stairs to enter Entrance Stairs-Rails: None Entrance Stairs-Number of Steps: 4 Discharge Bathroom Shower/Tub: Walk-in shower Discharge Bathroom Toilet: Standard Discharge Bathroom Accessibility: Yes How Accessible: Accessible via walker Does the patient have any problems obtaining your medications?: No   Social/Family/Support Systems Anticipated Caregiver: spouse, Maritta Kief Anticipated Caregiver's Contact Information: [phone] Ability/Limitations of Caregiver: none Caregiver Availability: 24/7 Discharge Plan Discussed with Primary Caregiver: Yes Is Caregiver In Agreement with Plan?: Yes Does Caregiver/Family have Issues with Lodging/Transportation while Pt is in Rehab?: No    Goals Patient/Family Goal for Rehab: PT/OT supervision/mod I, SLP supervision Expected length of stay: 10-14 days Pt/Family Agrees to Admission and willing to participate: Yes Program Orientation Provided & Reviewed with Pt/Caregiver Including Roles  & Responsibilities: Yes  Barriers to Discharge: Insurance for SNF coverage   Decrease burden of Care through IP rehab admission: n/a   Possible need for SNF placement upon discharge: No   Patient Condition: I have reviewed medical records from [facility], spoken with CM, and patient and spouse. I met with patient at the bedside and discussed via phone for inpatient rehabilitation assessment.  Patient will benefit from ongoing PT, OT, and SLP, can actively participate in 3 hours of therapy a day 5 days of the week, and can make measurable gains during the admission.  Patient will also benefit from the coordinated team approach during an Inpatient Acute Rehabilitation admission.  The patient will receive intensive therapy as well as Rehabilitation physician, nursing, social worker, and care management interventions.  Due to bladder management, bowel management, safety, skin/wound care, disease management, medication administration, pain management, and patient education the patient requires 24 hour a day rehabilitation nursing.  The patient is currently min assist with mobility and basic ADLs.  Discharge setting and therapy post discharge at home with home health is anticipated.  Patient has agreed to participate in the Acute Inpatient Rehabilitation Program and will admit today.   Preadmission Screen Completed By:  Michel Santee, PT, DPT 06/01/2021 12:42 PM ______________________________________________________________________   Discussed status with Dr. Dagoberto Ligas on 06/01/21  at .now  and received approval for admission today.   Admission Coordinator:  Michel Santee, PT, time 12:42 PM Sudie Grumbling 06/01/21      Assessment/Plan: Diagnosis: Does the  need for close, 24 hr/day Medical supervision in concert with the patient's rehab needs make it unreasonable for this patient to be served in a less intensive setting? Yes Co-Morbidities requiring supervision/potential complications: Anxiety, B/L MCA strokes with apahsia,  dysphagia, Afib with RVR on Eliquis; R ACA stroke as well- R hemiparesis/L gaze preference Due to bladder management, bowel management, safety, skin/wound care, disease management, medication administration, and patient education, does the patient require 24 hr/day rehab nursing? Yes Does the patient require coordinated care of a physician, rehab nurse, PT, OT, and SLP to address physical and functional deficits in the context of the above medical diagnosis(es)? Yes Addressing deficits in the following areas: balance, endurance, locomotion, strength, transferring, bowel/bladder control, bathing, dressing, feeding, grooming, toileting, cognition, speech, language, and swallowing Can the patient actively participate in an intensive therapy program of at least 3 hrs of therapy 5 days a week? Yes The potential for patient to make measurable gains while on inpatient rehab is good Anticipated functional outcomes upon discharge from inpatient rehab: modified independent PT, modified independent OT, supervision SLP Estimated rehab length of stay to reach the above functional goals is: 10-14 days Anticipated discharge destination: Home 10. Overall Rehab/Functional Prognosis: good     MD Signature:           Revision History                                    Note Details  Jan Fireman, MD File Time 06/01/2021 12:55 PM  Author Type Physician Status Signed  Last Editor Courtney Heys, MD Service Physical Medicine and Rehabilitation

## 2021-06-01 NOTE — TOC Transition Note (Signed)
Transition of Care [facility]) - CM/SW Discharge Note   Patient Details  Name: Deborah Carey MRN: [id number] Date of Birth: 1951-07-11  Transition of Care [facility]) CM/SW Contact:  Pollie Friar, RN Phone Number: 06/01/2021, 12:06 PM   Clinical Narrative:    Patient is discharging to CIR today. CM signing off.   Final next level of care: IP Rehab Facility Barriers to Discharge: No Barriers Identified   Patient Goals and CMS Choice        Discharge Placement                       Discharge Plan and Services                                     Social Determinants of Health (SDOH) Interventions     Readmission Risk Interventions No flowsheet data found.

## 2021-06-01 NOTE — Progress Notes (Signed)
Inpatient Rehabilitation Medication Review by a Pharmacist  A complete drug regimen review was completed for this patient to identify any potential clinically significant medication issues.  High Risk Drug Classes Is patient taking? Indication by Medication  Antipsychotic Yes Compazine for nausea  Anticoagulant Yes Eliquis for afib  Antibiotic No   Opioid No   Antiplatelet No   Hypoglycemics/insulin No   Vasoactive Medication Yes Toprol for new afib  Chemotherapy No   Other No      Type of Medication Issue Identified Description of Issue Recommendation(s)  Drug Interaction(s) (clinically significant)     Duplicate Therapy     Allergy     No Medication Administration End Date     Incorrect Dose     Additional Drug Therapy Needed  SSI Resume SSI orders  Significant med changes from prior encounter (inform family/care partners about these prior to discharge).    Other       Clinically significant medication issues were identified that warrant physician communication and completion of prescribed/recommended actions by midnight of the next day:  No  Time spent performing this drug regimen review (minutes):  57min  Gillie Crisci S. Alford Highland, PharmD, BCPS Clinical Staff Pharmacist [email or web address] [place] 06/01/2021 3:53 PM

## 2021-06-01 NOTE — H&P (Signed)
Physical Medicine and Rehabilitation Admission H&P     CC: Stroke with functional deficits.      HPI: Deborah Carey. Velasquez is a 70 year old female with history of OA, anxiety who was admitted to [facility] 05/25/21 with acute on set of right sided weakness, left ward gaze and inability to speak. CTA head showed occlusion of L-M1 MCA with large L-MCA ischemia as well as incidental 1.1 cm soft tissue nodule medial right apex --CT chest recommended in 3 months.  She developed acute respiratory failure requiring intubation prior to transfer to [facility] for intervention. She received TNK and underwent cerebral angio with thrombectomy of proximal L-M1/MCA occlusion with completed recanalization. Post procedure had episode of PAF, hypotension treated with IVF/albumin as well as episode of emesis/abdominal pain. CT abdomen/pelvis showed cholelithiasis without cystitis and scattered colonic diverticula without acute diverticulitis. 2D echo showed EF 50-55% showed mild to moderate TVR and mild septal hypokinesis.    MRA brain showed patency of L-MCA branches and stenotic disease both PCA P2 segments. MRI brain showed Left MCA infarct prominent in L-putamen and caudate head, with minimal petechial blood products and mild swelling, three punctate infarct in right frontal lobe and probable microemboli in R-ACA as well as  old right parietal cortical/subcortical infarcts. Dr. Irish Lack consulted for input and recommended starting [facility] once cleared by neuro. MBS done revealing delay in swallow with mild to moderate pharyngeal dysphagia--->on D3, nectar liquids. Dr. Erlinda Hong felt that stroke embolic due to newly diagnosed A fib. She was started on ASA 325 mg and transitioned to Ali Chukson on 09/24.  She is showing improvement in verbal output but continues to be limited by STM deficits, RLE weakness, cognitive impairments with  BLE>BUE weakness as well as dysphagia with noncompliance with thickened liquids. CIR recommended due to functional decline.     Denies pain; on D3 nectar thick liquids.  LBM this AM.          Review of Systems  Constitutional:  Negative for fever and malaise/fatigue.  HENT:  Negative for hearing loss.   Eyes:  Negative for blurred vision and double vision.  Respiratory:  Negative for cough and shortness of breath.   Cardiovascular:  Negative for palpitations and leg swelling.  Gastrointestinal:  Negative for constipation (had BM few minutes ago), heartburn and nausea.  Genitourinary:  Negative for dysuria and urgency.  Musculoskeletal:  Negative for back pain, joint pain, myalgias and neck pain.  Neurological:  Positive for focal weakness. Negative for dizziness and headaches.  Psychiatric/Behavioral:  The patient does not have insomnia.   All other systems reviewed and are negative.         Past Medical History:  Diagnosis Date   Allergy      allergic rhinitis   Anxiety     Cataract      removed both eyes    Infertility, female     Osteoarthritis of both knees      OA    PONV (postoperative nausea and vomiting)     Post-menopausal bleeding      neg endo bx.           Past Surgical History:  Procedure Laterality Date   cataract surgery   2011   CESAREAN SECTION        times 2   COLONOSCOPY   2010    hems    ENDOMETRIAL BIOPSY        neg for CA cells   FACIAL COSMETIC SURGERY  2006    face lift   FOOT SURGERY   1998    rt heel spur removal   IR CT HEAD LTD   05/25/2021   IR PERCUTANEOUS ART THROMBECTOMY/INFUSION INTRACRANIAL INC DIAG ANGIO   05/25/2021   IR US GUIDE VASC ACCESS RIGHT   05/25/2021   RADIOLOGY WITH ANESTHESIA N/A 05/25/2021    Procedure: IR WITH ANESTHESIA;  Surgeon: Aletta Edouard, MD;  Location: [place];  Service: Radiology;  Laterality: N/A;   REFRACTIVE SURGERY   08/1999           Family History  Problem Relation Age of Onset   COPD Brother     Stroke Mother     Hypertension Mother     Osteoporosis Mother     Alzheimer's disease Father     Hypertension  Father     Osteoporosis Father     Depression Sister     Hypertension Sister     Osteoporosis Sister     Thyroid disease Sister     Hypothyroidism Sister     Parkinson's disease Sister     Osteoporosis Sister     Hypothyroidism Sister     Breast cancer Neg Hx     Colon polyps Neg Hx     Colon cancer Neg Hx     Esophageal cancer Neg Hx     Rectal cancer Neg Hx     Stomach cancer Neg Hx        Social History:  Married. Retired Oncologist. She reports that she has never smoked. She has never used smokeless tobacco. She reports that she drinks alcohol on rare occasions and does not use drugs.     Allergies: No Known Allergies           Medications Prior to Admission  Medication Sig Dispense Refill   albuterol (PROVENTIL HFA;VENTOLIN HFA) 108 (90 Base) MCG/ACT inhaler Inhale 1-2 puffs into the lungs every 6 (six) hours as needed for wheezing (or for cough). 1 Inhaler 1   calcium gluconate 500 MG tablet Take 1 tablet by mouth daily.       cetirizine (ZYRTEC) 10 MG tablet Take 10 mg by mouth daily as needed for allergies.       Cholecalciferol (VITAMIN D3) 125 MCG (5000 UT) CAPS Take 5,000 Units by mouth daily.       fluticasone (FLONASE) 50 MCG/ACT nasal spray Place 2 sprays into both nostrils as needed. (Patient taking differently: Place 2 sprays into both nostrils as needed for allergies.) 16 g 1   Multiple Vitamins-Minerals (MULTIVITAMIN PO) Take 1 tablet by mouth daily.       naproxen sodium (ANAPROX) 220 MG tablet Take 220 mg by mouth daily as needed (pain).       FLUoxetine (PROZAC) 40 MG capsule Take 40 mg by mouth daily. (Patient not taking: Reported on 05/27/2021)          Drug Regimen Review  Drug regimen was reviewed and remains appropriate with no significant issues identified   Home: Home Living Family/patient expects to be discharged to:: Private residence Living Arrangements: Spouse/significant other Available Help at Discharge: Family, Available 24  hours/day Type of Home: House Home Access: Stairs to enter [employer] of Steps: 4 Entrance Stairs-Rails: None Home Layout: Two level, Able to live on main level with bedroom/bathroom Bathroom Shower/Tub: Multimedia programmer: Standard Home Equipment: None   Functional History: Prior Function Level of Independence: Independent Comments: driving   Functional Status:  Mobility:  Bed Mobility Overal bed mobility: Modified Independent Bed Mobility: Supine to Sit Supine to sit: Min assist, HOB elevated Sit to supine: Max assist General bed mobility comments: Increased time but pt was able to manage exit and entrance without difficulty. Transfers Overall transfer level: Needs assistance Equipment used: None Transfers: Sit to/from Stand Sit to Stand: Supervision Stand pivot transfers: Min assist General transfer comment: Pt demonstrated proper hand placement on seated surface for safety. No assist required however supervision provided for safety. Ambulation/Gait Ambulation/Gait assistance: Min guard Gait Distance (Feet): 200 Feet Assistive device: Rolling walker (2 wheeled) Gait Pattern/deviations: Step-through pattern, Decreased step length - right, Decreased dorsiflexion - right General Gait Details: Decreased foot clearance on the R but overall good navigating in the hallway. She was able to direction find despite several turns in the unit. Gait velocity: Decreased Gait velocity interpretation: 1.31 - 2.62 ft/sec, indicative of limited community ambulator Stairs: Yes Stairs assistance: Min guard Stair Management: Two rails, Alternating pattern, Forwards Number of Stairs: 5 General stair comments: Practice stairs in the rehab gym as part of the DGI   ADL: ADL Overall ADL's : Needs assistance/impaired Grooming: Moderate assistance Upper Body Bathing: Minimal assistance, Sitting Lower Body Bathing: Moderate assistance, Sit to/from stand Upper Body  Dressing : Moderate assistance, Sitting Lower Body Dressing: Maximal assistance, Sit to/from stand Toilet Transfer: Minimal assistance, Ambulation, RW, Regular Toilet, Grab bars Toilet Transfer Details (indicate cue type and reason): min A for verbal cues for RW management and safety Toileting- Clothing Manipulation and Hygiene: Min guard, Sitting/lateral lean Toileting - Clothing Manipulation Details (indicate cue type and reason): has not voided since 4am per nursing - unsure of awareness Functional mobility during ADLs: Min guard, Rolling walker General ADL Comments: close min guard for safety; pt continues to have R bias and some R weakness that cause safety concerns with ambulation. Pt managed 3 steps this session with a reciprocal gait pattern   Cognition: Cognition Overall Cognitive Status: Impaired/Different from baseline Arousal/Alertness: Awake/alert Orientation Level: Oriented X4 Year: 2022 Month: September Day of Week: Correct Awareness: Impaired Awareness Impairment: Intellectual impairment Cognition Arousal/Alertness: Awake/alert Behavior During Therapy: Flat affect Overall Cognitive Status: Impaired/Different from baseline Area of Impairment: Memory Orientation Level: Disoriented to, Time, Place (difficulty with expressive language) Current Attention Level: Sustained Memory: Decreased short-term memory Following Commands: Follows one step commands consistently, Follows multi-step commands inconsistently Safety/Judgement: Decreased awareness of safety Awareness: Emergent Problem Solving: Slow processing, Difficulty sequencing, Requires verbal cues, Requires tactile cues General Comments: Cognition improved overall however still with flat affect. Pt went to the sink to wash face, however once there she washed her hands instead and moved on to ambulating in the hallway without washing face.     Blood pressure 123/75, pulse 65, temperature 98.3 F (36.8 C), temperature  source Oral, resp. rate 17, height 5\' 4"  (1.626 m), weight 96 kg, last menstrual period 09/05/2008, SpO2 98 %. Physical Exam Vitals and nursing note reviewed. Exam conducted with a chaperone present.  Constitutional:      Appearance: Normal appearance. She is obese.     Comments: Well healed old scar in hairline.  Has pitcher of water and multiple water cups in room--educated pt/husband on need for thickened liquids.  Sitting up in a bedside chair; husband in room, NAD   HENT:     Head: Normocephalic and atraumatic.     Comments: R facial droop; R tongue deviation;     Right Ear: External ear normal.     Left  Ear: External ear normal.     Nose: Nose normal. No congestion.     Mouth/Throat:     Mouth: Mucous membranes are dry.     Pharynx: Oropharynx is clear. No oropharyngeal exudate.  Eyes:     General:        Right eye: No discharge.        Left eye: No discharge.     Extraocular Movements: Extraocular movements intact.     Comments: No nystagmus  Cardiovascular:     Rate and Rhythm: Normal rate.     Heart sounds: Normal heart sounds. No murmur heard.   No gallop.  Pulmonary:     Comments: CTA B/L- no W/R/R- good air movement   Abdominal:     Comments: Soft, NT, ND, (+)BS   Musculoskeletal:     Cervical back: Normal range of motion and neck supple.     Comments: RUE- biceps, triceps, WE, grip and finger abd 4+/5 LUE- 5/5 RLE HF, KE, KF, DF and PF 4+/5 LLE- 5/5   Skin:    General: Skin is warm and dry.     Comments: Big bruise; RUE upper arm; and L hand  Neurological:     Mental Status: She is alert and oriented to person, place, and time.     Comments: Able to answer biographic questions without difficulty  Slightly slowed processing-   Psychiatric:     Comments: Appropriate, but flat      Lab Results Last 48 Hours        Results for orders placed or performed during the hospital encounter of 05/25/21 (from the past 48 hour(s))  Glucose, capillary     Status:  Abnormal    Collection Time: 05/30/21 12:58 PM  Result Value Ref Range    Glucose-Capillary 172 (H) 70 - 99 mg/dL      Comment: Glucose reference range applies only to samples taken after fasting for at least 8 hours.    Comment 1 Notify RN      Comment 2 Document in Chart    Glucose, capillary     Status: Abnormal    Collection Time: 05/30/21  4:42 PM  Result Value Ref Range    Glucose-Capillary 121 (H) 70 - 99 mg/dL      Comment: Glucose reference range applies only to samples taken after fasting for at least 8 hours.  Glucose, capillary     Status: Abnormal    Collection Time: 05/30/21  7:44 PM  Result Value Ref Range    Glucose-Capillary 127 (H) 70 - 99 mg/dL      Comment: Glucose reference range applies only to samples taken after fasting for at least 8 hours.    Comment 1 Notify RN      Comment 2 Document in Chart    Glucose, capillary     Status: None    Collection Time: 05/30/21 11:33 PM  Result Value Ref Range    Glucose-Capillary 99 70 - 99 mg/dL      Comment: Glucose reference range applies only to samples taken after fasting for at least 8 hours.    Comment 1 Notify RN      Comment 2 Document in Chart    Glucose, capillary     Status: Abnormal    Collection Time: 05/31/21  3:59 AM  Result Value Ref Range    Glucose-Capillary 120 (H) 70 - 99 mg/dL      Comment: Glucose reference range applies only to samples taken  after fasting for at least 8 hours.    Comment 1 Notify RN      Comment 2 Document in Chart    CBC     Status: Abnormal    Collection Time: 05/31/21  7:24 AM  Result Value Ref Range    WBC 9.3 4.0 - 10.5 K/uL    RBC 3.94 3.87 - 5.11 MIL/uL    Hemoglobin 12.6 12.0 - 15.0 g/dL    HCT 35.9 (L) 36.0 - 46.0 %    MCV 91.1 80.0 - 100.0 fL    MCH 32.0 26.0 - 34.0 pg    MCHC 35.1 30.0 - 36.0 g/dL    RDW 11.9 11.5 - 15.5 %    Platelets 210 150 - 400 K/uL    nRBC 0.0 0.0 - 0.2 %      Comment: Performed at [facility], [place] [address] [place], [place]  [address]  Basic metabolic panel     Status: Abnormal    Collection Time: 05/31/21  7:24 AM  Result Value Ref Range    Sodium 139 135 - 145 mmol/L    Potassium 3.7 3.5 - 5.1 mmol/L    Chloride 106 98 - 111 mmol/L    CO2 27 22 - 32 mmol/L    Glucose, Bld 106 (H) 70 - 99 mg/dL      Comment: Glucose reference range applies only to samples taken after fasting for at least 8 hours.    BUN 12 8 - 23 mg/dL    Creatinine, Ser 0.77 0.44 - 1.00 mg/dL    Calcium 9.3 8.9 - 10.3 mg/dL    GFR, Estimated >60 >60 mL/min      Comment: (NOTE) Calculated using the CKD-EPI Creatinine Equation (2021)      Anion gap 6 5 - 15      Comment: Performed at [facility] [address], [place], [place] [address]  Glucose, capillary     Status: Abnormal    Collection Time: 05/31/21  7:46 AM  Result Value Ref Range    Glucose-Capillary 113 (H) 70 - 99 mg/dL      Comment: Glucose reference range applies only to samples taken after fasting for at least 8 hours.  Glucose, capillary     Status: Abnormal    Collection Time: 05/31/21 11:53 AM  Result Value Ref Range    Glucose-Capillary 122 (H) 70 - 99 mg/dL      Comment: Glucose reference range applies only to samples taken after fasting for at least 8 hours.  Glucose, capillary     Status: Abnormal    Collection Time: 05/31/21  4:19 PM  Result Value Ref Range    Glucose-Capillary 107 (H) 70 - 99 mg/dL      Comment: Glucose reference range applies only to samples taken after fasting for at least 8 hours.    Comment 1 Notify RN      Comment 2 Document in Chart    Glucose, capillary     Status: Abnormal    Collection Time: 05/31/21  7:45 PM  Result Value Ref Range    Glucose-Capillary 155 (H) 70 - 99 mg/dL      Comment: Glucose reference range applies only to samples taken after fasting for at least 8 hours.  Glucose, capillary     Status: None    Collection Time: 05/31/21 11:51 PM  Result Value Ref Range    Glucose-Capillary 95 70 - 99 mg/dL  Comment: Glucose reference range applies only to samples taken after fasting for at least 8 hours.  Glucose, capillary     Status: Abnormal    Collection Time: 06/01/21  3:54 AM  Result Value Ref Range    Glucose-Capillary 109 (H) 70 - 99 mg/dL      Comment: Glucose reference range applies only to samples taken after fasting for at least 8 hours.  Glucose, capillary     Status: Abnormal    Collection Time: 06/01/21  8:00 AM  Result Value Ref Range    Glucose-Capillary 109 (H) 70 - 99 mg/dL      Comment: Glucose reference range applies only to samples taken after fasting for at least 8 hours.      Imaging Results (Last 48 hours)  No results found.           Medical Problem List and Plan: 1.  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  Antithrombotics: -DVT/anticoagulation:  Pharmaceutical: Other (comment)--Eliquis.              -antiplatelet therapy: N/A 3. Pain Management: Tylenol prn.  4. Mood: LCSW to follow for evaluation and support.              -antipsychotic agents: N/A 5. Neuropsych: This patient is capable of making decisions on her own behalf. 6. Skin/Wound Care: Routine pressure relief measures.  7. Fluids/Electrolytes/Nutrition: Monitor I/O. Check lytes in am. 8. A fib: Monitor HR TID--continue metoprolol and Eliquis. 9.  Dyslipidemia: Now on Lipitor.  10. Dysphagia: Continue DIII w/nectar liquids and supervision for safety.  11. Impaired fasting sugars: Hgb A1C-5.8. Add carb modifications to diet.            Bary Leriche, PA-C 06/01/2021    I have personally performed a face to face diagnostic evaluation of this patient and formulated the key components of the plan.  Additionally, I have personally reviewed laboratory data, imaging studies, as well as relevant notes and concur with the physician assistant's documentation above.   The patient's  status has not changed from the original H&P.  Any changes in documentation from the acute care chart have been noted above.

## 2021-06-01 NOTE — Discharge Summary (Addendum)
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 Fib Mild residual aphasia   Acute respiratory insufficiency   Atrial fibrillation (HCC)-new onset   Hypoxia   Medications to be continued on Rehab Allergies as of 06/01/2021   No Known Allergies      Medication List     STOP taking these medications    albuterol 108 (90 Base) MCG/ACT inhaler Commonly known as: VENTOLIN HFA   calcium gluconate 500 MG tablet   cetirizine 10 MG tablet Commonly known as: ZYRTEC   FLUoxetine 40 MG capsule Commonly known as: PROZAC   fluticasone 50 MCG/ACT nasal spray Commonly known as: FLONASE   MULTIVITAMIN PO   naproxen sodium 220 MG tablet Commonly known as: ALEVE   Vitamin D3 125 MCG (5000 UT) Caps       TAKE these medications    acetaminophen 325 MG tablet Commonly known as: TYLENOL Take 2 tablets (650 mg total) by mouth every 4 (four) hours as needed for mild pain (or temp > 37.5 C (99.5 F)).   apixaban 5 MG Tabs tablet Commonly known as: ELIQUIS Take 1 tablet (5 mg total) by mouth 2 (two) times daily.   atorvastatin 40 MG tablet Commonly known as: LIPITOR Take 1 tablet (40 mg total) by mouth daily. Start taking on: June 02, 2021   insulin aspart 100 UNIT/ML injection Commonly known as: novoLOG Inject 0-15 Units into the skin every 4 (four) hours.   metoprolol succinate 25 MG 24 hr tablet Commonly known as: TOPROL-XL Take 1 tablet (25 mg total) by mouth daily. Start  taking on: June 02, 2021   pantoprazole 40 MG tablet Commonly known as: PROTONIX Take 1 tablet (40 mg total) by mouth daily. Start taking on: June 02, 2021   senna-docusate 8.6-50 MG tablet Commonly known as: Senokot-S Take 1 tablet by mouth at bedtime as needed for mild constipation.        LABORATORY STUDIES CBC    Component Value Date/Time   WBC 9.3 05/31/2021 0724   RBC 3.94 05/31/2021 0724   HGB 12.6 05/31/2021 0724   HCT 35.9 (L) 05/31/2021 0724   PLT 210 05/31/2021 0724   MCV 91.1 05/31/2021 0724   MCH 32.0 05/31/2021 0724   MCHC 35.1 05/31/2021 0724   RDW 11.9 05/31/2021 0724   LYMPHSABS 2.8 05/25/2021 1447   MONOABS 1.0 05/25/2021 1447   EOSABS 0.1 05/25/2021 1447   BASOSABS 0.1 05/25/2021 1447   CMP    Component Value Date/Time   NA 139 05/31/2021 0724   K 3.7 05/31/2021 0724   CL 106 05/31/2021 0724   CO2 27 05/31/2021 0724   GLUCOSE 106 (H) 05/31/2021 0724   BUN 12 05/31/2021 0724   CREATININE 0.77 05/31/2021 0724   CALCIUM 9.3 05/31/2021 0724   PROT 6.1 (L) 05/26/2021 0429   ALBUMIN 3.4 (L) 05/26/2021 0429   AST 18 05/26/2021 0429   ALT 15 05/26/2021 0429   ALKPHOS 47 05/26/2021 0429   BILITOT 0.6   05/26/2021 0429   GFRNONAA >60 05/31/2021 0724   GFRAA 132 06/06/2008 1118   COAGS Lab Results  Component Value Date   INR 0.9 05/25/2021   Lipid Panel    Component Value Date/Time   CHOL 171 05/26/2021 0429   TRIG 64 05/26/2021 0429   TRIG 66 05/26/2021 0429   HDL 49 05/26/2021 0429   CHOLHDL 3.5 05/26/2021 0429   VLDL 13 05/26/2021 0429   LDLCALC 109 (H) 05/26/2021 0429   HgbA1C  Lab Results  Component Value Date   HGBA1C 5.8 (H) 05/26/2021   Urinalysis    Component Value Date/Time   BILIRUBINUR n 06/02/2015 0856   PROTEINUR n 06/02/2015 0856   UROBILINOGEN negative 06/02/2015 0856   NITRITE n 06/02/2015 0856   LEUKOCYTESUR Negative 06/02/2015 0856   Urine Drug Screen No results found for: LABOPIA, COCAINSCRNUR,  LABBENZ, AMPHETMU, THCU, LABBARB  Alcohol Level    Component Value Date/Time   [facility] <10 05/25/2021 1907     SIGNIFICANT DIAGNOSTIC STUDIES CT ABDOMEN PELVIS W WO CONTRAST  Result Date: 05/26/2021 CLINICAL DATA:  Abdominal pain. EXAM: CT ABDOMEN AND PELVIS WITHOUT AND WITH CONTRAST TECHNIQUE: Multidetector CT imaging of the abdomen and pelvis was performed following the standard protocol before and following the bolus administration of intravenous contrast. CONTRAST:  33m OMNIPAQUE IOHEXOL 350 MG/ML SOLN COMPARISON:  None. FINDINGS: Lower chest: The lung bases are clear of an acute process. No pleural effusions or pulmonary lesions. The heart is within normal limits in size. No pericardial effusion. Hepatobiliary: No hepatic lesions or intrahepatic biliary dilatation. High attenuation material in the gallbladder likely due to vicarious excretion of contrast material from prior CT scan and interventional procedure. There is a 2.4 cm gallstone noted the gallbladder but no findings suspicious for acute cholecystitis. No common bile duct dilatation. Pancreas: No mass, inflammation or ductal dilatation. Spleen: Normal size.  No focal lesions. Adrenals/Urinary Tract: Adrenal glands and kidneys are unremarkable. There is some contrast in both collecting systems. The bladder also contains contrast material. No bladder mass or asymmetric bladder wall thickening. Stomach/Bowel: The stomach, duodenum, small bowel and colon are unremarkable. No acute inflammatory changes, mass lesions or obstructive findings. The terminal ileum is normal. The appendix is normal. Scattered colonic diverticulosis but no findings for acute diverticulitis. Vascular/Lymphatic: Scattered atherosclerotic calcifications but no aneurysm or dissection. The major venous structures are patent. No mesenteric or retroperitoneal mass or adenopathy. Reproductive: The uterus and ovaries are unremarkable. Other: No pelvic mass or adenopathy. No free  pelvic fluid collections. No inguinal mass or adenopathy. Small periumbilical abdominal wall hernia containing fat. Musculoskeletal: No significant bony findings. IMPRESSION: 1. No acute abdominal/pelvic findings, mass lesions or adenopathy. 2. Cholelithiasis without findings suspicious for acute cholecystitis. 3. Scattered colonic diverticulosis but no findings for acute diverticulitis. Electronically Signed   By: PMarijo SanesM.D.   On: 05/26/2021 20:30   MR ANGIO HEAD WO CONTRAST  Result Date: 05/26/2021 CLINICAL DATA:  Follow-up stroke. Left-sided weakness. Left MCA occlusion with intervention. EXAM: MRA HEAD WITHOUT CONTRAST TECHNIQUE: Angiographic images of the Circle of Willis were acquired using MRA technique without intravenous contrast. COMPARISON:  MRI earlier same day FINDINGS: Anterior circulation: Both internal carotid arteries are widely patent through the skull base and siphon regions. The anterior and middle cerebral vessels are patent. Reconstituted flow in the left MCA without proximal stenosis or missing distal branch vessels. Posterior circulation: Both vertebral arteries widely patent to the basilar. No basilar stenosis. Stenoses in the PCA P2  segments, worse on the left than the right. Anatomic variants: None significant. Other: None. IMPRESSION: Good appearance of the anterior circulation flow presently. Status post mechanical thrombectomy on the left. Wide patency of the left MCA branches presently. 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 Electronically Signed   By: Mark  Shogry M.D.   On: 05/26/2021 15:18   MR BRAIN WO CONTRAST  Result Date: 05/26/2021 CLINICAL DATA:  Follow-up stroke. EXAM: MRI HEAD WITHOUT CONTRAST TECHNIQUE: Multiplanar, multiecho pulse sequences of the brain and surrounding structures were obtained without intravenous contrast. COMPARISON:  MR angiography same day. CT studies and intervention done yesterday. FINDINGS: Brain: There is  acute infarction affecting the corpus stratum on the left. Minimal punctate infarction in the left posterior frontal subcortical white matter. Swelling of the putamen and caudate, with minimal petechial blood products but without frank hemorrhage. Three punctate foci of acute infarction in the right medial frontal lobe, possibly small embolic infarctions in the right anterior cerebral artery territory. No midline shift. Elsewhere, there is old cortical and subcortical infarction in the right parietal lobe and moderate chronic small-vessel ischemic changes of the cerebral hemispheric white matter. No hydrocephalus. No extra-axial collection. Vascular: Major vessels at the base of the brain show flow. Skull and upper cervical spine: Negative Sinuses/Orbits: Clear/normal Other: None IMPRESSION: Acute infarction of the left putamen and caudate. Minimal petechial blood products but no frank hematoma. Mild swelling but no midline shift. Three punctate acute infarctions within the medial right frontal lobe, probably micro embolic infarctions in the anterior cerebral artery territory. Old infarction in the right parietal cortical and subcortical brain and seen affecting the cerebral hemispheric deep white matter extensively. Electronically Signed   By: Mark  Shogry M.D.   On: 05/26/2021 15:51   IR CT Head Ltd  Result Date: 05/25/2021 INDICATION: Deborah Carey is an 70 year-old female with a past medical history significant for osteoarthritis and cataracts who presented emergently to the ED at [facility] due to right side weakness, with eyes drifting to left, and unable to talk with decreased level of consciousness. NIHSS at presentation was 19; baseline modified Rankin scale 0. Head CT showed no evidence of a large acute infarct or hemorrhage (ASPECTS 10). CTA showed a proximal left M1/MCA occlusion with good collaterals. She receive IV TNK and was then transferred to our service for a diagnostic cerebral angiogram and  mechanical thrombectomy. EXAM: ULTRASOUND GUIDED VASCULAR ACCESS DIAGNOSTIC CEREBRAL ANGIOGRAM MECHANICAL THROMBECTOMY FLAT PANEL HEAD CT COMPARISON:  CT/CTA 05/25/2021. MEDICATIONS: No antibiotic administered. ANESTHESIA/SEDATION: The procedure was performed under general anesthesia. CONTRAST:  25 mL of Omnipaque 240 mg/mL FLUOROSCOPY TIME:  Fluoroscopy Time: 4 minutes 36 seconds (420 mGy). COMPLICATIONS: None immediate. TECHNIQUE: Informed written consent was obtained from the patient's husband after a thorough discussion of the procedural risks, benefits and alternatives. All questions were addressed. Maximal Sterile Barrier Technique was utilized including caps, mask, sterile gowns, sterile gloves, sterile drape, hand hygiene and skin antiseptic. A timeout was performed prior to the initiation of the procedure. The right groin was prepped and draped in the usual sterile fashion. Using a micropuncture kit and the modified Seldinger technique, access was gained to the right common femoral artery and an 8 French sheath was placed. Real-time ultrasound guidance was utilized for vascular access including the acquisition of a permanent ultrasound image documenting patency of the accessed vessel. Under fluoroscopy, a Zoom 88 guide catheter was navigated over a 6 French Berenstein 2 catheter and a   0.035" Terumo Glidewire into the aortic arch. The catheter was placed into the left common carotid artery and then advanced into the left internal carotid artery. The Berenstein 2 catheter was removed. Frontal and lateral angiograms of the head were obtained. FINDINGS: 1. Normal caliber of the right common femoral artery. 2. Occlusion of the left MCA at the proximal M1 segment. PROCEDURE: Under biplane roadmap, a zoom 71 aspiration catheter was navigated over an Aristotle 24 microguidewire into the cavernous segment of the left ICA. The aspiration catheter was then advanced to the level of occlusion and connected to an  aspiration pump. Continuous aspiration was performed for 2 minutes. The guide catheter was connected to a VacLok syringe and advanced into the M1 segment while the aspiration catheter wasremoved under constant aspiration. The Zoom 88 guide catheter was continuously aspirated until clear blood return was obtained. Left ICA angiograms with frontal and lateral views showed complete left MCA vascular tree recanalization without evidence of thromboembolic complication. Flat panel CT of the head was obtained and post processed in a separate workstation with concurrent attending physician supervision. Selected images were sent to PACS. No evidence of hemorrhagic complication. A right common femoral artery angiogram was then obtained in right anterior oblique view via sheath side port. The puncture is at the level of the common femoral artery which has normal caliber and contour, adequate for closure device. The femoral sheath was exchanged over the wire for a Perclose ProStyle which was utilized for access closure. Immediate hemostasis was achieved. IMPRESSION: Successful and uncomplicated mechanical thrombectomy performed with direct contact aspiration for treatment of a proximal left M1/MCA occlusion with complete recanalization (TICI3). PLAN: 1. Transfer to ICA for continued care. 2. SBP 120-140 mmHg for 24 hours post procedure. 3. Bed rest for 6 hours. Electronically Signed   By: Pedro Earls M.D.   On: 05/25/2021 17:58   IR US Guide Vasc Access Right  Result Date: 05/25/2021 INDICATION: BRODY KUMP is an 70 year-old female with a past medical history significant for osteoarthritis and cataracts who presented emergently to the ED at [facility] due to right side weakness, with eyes drifting to left, and unable to talk with decreased level of consciousness. NIHSS at presentation was 19; baseline modified Rankin scale 0. Head CT showed no evidence of a large acute infarct or hemorrhage (ASPECTS 10). CTA  showed a proximal left M1/MCA occlusion with good collaterals. She receive IV TNK and was then transferred to our service for a diagnostic cerebral angiogram and mechanical thrombectomy. EXAM: ULTRASOUND GUIDED VASCULAR ACCESS DIAGNOSTIC CEREBRAL ANGIOGRAM MECHANICAL THROMBECTOMY FLAT PANEL HEAD CT COMPARISON:  CT/CTA 05/25/2021. MEDICATIONS: No antibiotic administered. ANESTHESIA/SEDATION: The procedure was performed under general anesthesia. CONTRAST:  25 mL of Omnipaque 240 mg/mL FLUOROSCOPY TIME:  Fluoroscopy Time: 4 minutes 36 seconds (420 mGy). COMPLICATIONS: None immediate. TECHNIQUE: Informed written consent was obtained from the patient's husband after a thorough discussion of the procedural risks, benefits and alternatives. All questions were addressed. Maximal Sterile Barrier Technique was utilized including caps, mask, sterile gowns, sterile gloves, sterile drape, hand hygiene and skin antiseptic. A timeout was performed prior to the initiation of the procedure. The right groin was prepped and draped in the usual sterile fashion. Using a micropuncture kit and the modified Seldinger technique, access was gained to the right common femoral artery and an 8 French sheath was placed. Real-time ultrasound guidance was utilized for vascular access including the acquisition of a permanent ultrasound image documenting patency of the accessed  vessel. Under fluoroscopy, a Zoom 88 guide catheter was navigated over a 6 French Berenstein 2 catheter and a 0.035" Terumo Glidewire into the aortic arch. The catheter was placed into the left common carotid artery and then advanced into the left internal carotid artery. The Berenstein 2 catheter was removed. Frontal and lateral angiograms of the head were obtained. FINDINGS: 1. Normal caliber of the right common femoral artery. 2. Occlusion of the left MCA at the proximal M1 segment. PROCEDURE: Under biplane roadmap, a zoom 71 aspiration catheter was navigated over an  Aristotle 24 microguidewire into the cavernous segment of the left ICA. The aspiration catheter was then advanced to the level of occlusion and connected to an aspiration pump. Continuous aspiration was performed for 2 minutes. The guide catheter was connected to a VacLok syringe and advanced into the M1 segment while the aspiration catheter wasremoved under constant aspiration. The Zoom 88 guide catheter was continuously aspirated until clear blood return was obtained. Left ICA angiograms with frontal and lateral views showed complete left MCA vascular tree recanalization without evidence of thromboembolic complication. Flat panel CT of the head was obtained and post processed in a separate workstation with concurrent attending physician supervision. Selected images were sent to PACS. No evidence of hemorrhagic complication. A right common femoral artery angiogram was then obtained in right anterior oblique view via sheath side port. The puncture is at the level of the common femoral artery which has normal caliber and contour, adequate for closure device. The femoral sheath was exchanged over the wire for a Perclose ProStyle which was utilized for access closure. Immediate hemostasis was achieved. IMPRESSION: Successful and uncomplicated mechanical thrombectomy performed with direct contact aspiration for treatment of a proximal left M1/MCA occlusion with complete recanalization (TICI3). PLAN: 1. Transfer to ICA for continued care. 2. SBP 120-140 mmHg for 24 hours post procedure. 3. Bed rest for 6 hours. Electronically Signed   By: Katyucia  de Macedo Rodrigues M.D.   On: 05/25/2021 17:58   DG CHEST PORT 1 VIEW  Result Date: 05/26/2021 CLINICAL DATA:  Status post intubation. EXAM: PORTABLE CHEST 1 VIEW COMPARISON:  05/25/2021 FINDINGS: The ETT tip is 1.2 cm above the carina. Normal heart size. Interval resolution of previous interstitial and airspace opacities. Remote appearing right posterior rib fractures.  IMPRESSION: 1. Stable position of ET tube with tip above the carina. 2. Interval clearing of interstitial edema. Electronically Signed   By: Taylor  Stroud M.D.   On: 05/26/2021 05:59   DG Chest Portable 1 View  Result Date: 05/25/2021 CLINICAL DATA:  Shortness of breath EXAM: PORTABLE CHEST 1 VIEW COMPARISON:  None. FINDINGS: Endotracheal tube terminates proximally 1.5 cm above the carina. Heart size appears mildly enlarged. Diffuse bilateral interstitial prominence. No pleural effusion or pneumothorax. Age indeterminate fractures of the posterior right seventh and likely eighth ribs. IMPRESSION: 1. Endotracheal tube terminates 1.5 cm above the carina. 2. Diffuse bilateral interstitial prominence may reflect atelectasis, edema, versus atypical/viral infection. 3. Age indeterminate fractures of the posterior right seventh and likely eighth ribs. Electronically Signed   By: Nicholas  Plundo D.O.   On: 05/25/2021 16:03   DG Swallowing Func-Speech Pathology  Result Date: 05/27/2021 Table formatting from the original result was not included. Objective Swallowing Evaluation: Type of Study: MBS-Modified Barium Swallow Study  Patient Details Name: Deborah Carey MRN: [id number] Date of Birth: 09/16/1950 Today's Date: 05/27/2021 Time: SLP Start Time (ACUTE ONLY): 1305 -SLP Stop Time (ACUTE ONLY): 1315 SLP Time Calculation (min) (ACUTE ONLY):   10 min Past Medical History: Past Medical History: Diagnosis Date  Allergy   allergic rhinitis  Anxiety   Cataract   removed both eyes   Infertility, female   Osteoarthritis of both knees   OA   PONV (postoperative nausea and vomiting)   Post-menopausal bleeding   neg endo bx. Past Surgical History: Past Surgical History: Procedure Laterality Date  cataract surgery  2011  CESAREAN SECTION    times 2  COLONOSCOPY  2010  hems   ENDOMETRIAL BIOPSY    neg for CA cells  FACIAL COSMETIC SURGERY  2006  face lift  FOOT SURGERY  1998  rt heel spur removal  IR CT HEAD LTD  05/25/2021  IR  PERCUTANEOUS ART THROMBECTOMY/INFUSION INTRACRANIAL INC DIAG ANGIO  05/25/2021  IR US GUIDE VASC ACCESS RIGHT  05/25/2021  RADIOLOGY WITH ANESTHESIA N/A 05/25/2021  Procedure: IR WITH ANESTHESIA;  Surgeon: Yamagata, Glenn, MD;  Location: [facility] OR;  Service: Radiology;  Laterality: N/A;  REFRACTIVE SURGERY  08/1999 HPI: 70 y.o. F with PMH of cataracts, anxiety, osteoarthritis who was at the cancer center with her husband when she started having right-sided weakness with left eye deviation and aphasia. CTA with left M1/MCA occlusion and she was taken emergently to IR for mechanical thrombectomy which resulted in complete recanalization. Intubated 9/20 for procedure and extubated the same day.  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 Oral deficits c/b delayed oral transit, piecemeal swallow with mechanical soft textures, and reduced barium tablet propulsion to pharynx during barium tablet and nectar thick liquids series (pt able to propel tabelt to pharynx after 3 attempts). Pharyngeal deficits marked by reduced timely swallow initiation with thin liquids/ decreased timely epiglottic inversion allowing for pre swallow spillage and laryngeal penetration and tracheal aspiration of thins via both cup and straw (PAS-7,8). Pt would trigger a delayed throat clear in some instances but overall sensation of aspiration events remained poor and aspirates did not clear trachea. Pt with isolated instance of laryngeal penetration of nectar thick liquids when with barium tablet series only (PAS-3) nectar thick in isolation yielded intact airway protection as well as remaining POs tested (PAS-1). Recommend dysphagia 3 (mechanical soft) and nectar thick liquids with meds in puree and full supervision with POs. SLP to follow up. SLP Visit Diagnosis Dysphagia,  oropharyngeal phase (R13.12) Attention and concentration deficit following -- Frontal lobe and executive function deficit following -- Impact on safety and function Moderate aspiration risk;Mild aspiration risk   CHL IP TREATMENT RECOMMENDATION 05/27/2021 Treatment Recommendations Therapy as outlined in treatment plan below   Prognosis 05/27/2021 Prognosis for Safe Diet Advancement Good Barriers to Reach Goals Language deficits;Time post onset Barriers/Prognosis Comment -- CHL IP DIET RECOMMENDATION 05/27/2021 SLP Diet Recommendations Dysphagia 3 (Mech soft) solids;Nectar thick liquid Liquid Administration via Cup;Straw Medication Administration Whole meds with puree Compensations Small sips/bites;Slow rate;Minimize environmental distractions Postural Changes Seated upright at 90 degrees;Remain semi-upright after after feeds/meals (Comment)   CHL IP OTHER RECOMMENDATIONS 05/27/2021 Recommended Consults -- Oral Care Recommendations Oral care BID Other Recommendations Order thickener from pharmacy   CHL IP FOLLOW UP RECOMMENDATIONS 05/27/2021 Follow up Recommendations Other (comment)   CHL IP FREQUENCY AND DURATION 05/27/2021 Speech Therapy Frequency (ACUTE ONLY) min 2x/week Treatment Duration 2 weeks      CHL IP ORAL PHASE 05/27/2021 Oral Phase Impaired Oral - Pudding Teaspoon --   Oral - Pudding Cup -- Oral - Honey Teaspoon -- Oral - Honey Cup Delayed oral transit Oral - Nectar Teaspoon -- Oral - Nectar Cup Delayed oral transit Oral - Nectar Straw -- Oral - Thin Teaspoon -- Oral - Thin Cup Delayed oral transit Oral - Thin Straw Delayed oral transit Oral - Puree Delayed oral transit Oral - Mech Soft Delayed oral transit;Piecemeal swallowing;Decreased bolus cohesion Oral - Regular -- Oral - Multi-Consistency -- Oral - Pill Delayed oral transit;Reduced posterior propulsion;Holding of bolus Oral Phase - Comment --  CHL IP PHARYNGEAL PHASE 05/27/2021 Pharyngeal Phase Impaired Pharyngeal- Pudding Teaspoon -- Pharyngeal --  Pharyngeal- Pudding Cup -- Pharyngeal -- Pharyngeal- Honey Teaspoon -- Pharyngeal -- Pharyngeal- Honey Cup Delayed swallow initiation-vallecula;Reduced tongue base retraction;Pharyngeal residue - valleculae Pharyngeal -- Pharyngeal- Nectar Teaspoon -- Pharyngeal -- Pharyngeal- Nectar Cup Delayed swallow initiation-vallecula;Pharyngeal residue - valleculae Pharyngeal -- Pharyngeal- Nectar Straw -- Pharyngeal -- Pharyngeal- Thin Teaspoon -- Pharyngeal -- Pharyngeal- Thin Cup Delayed swallow initiation-pyriform sinuses;Penetration/Aspiration before swallow;Reduced airway/laryngeal closure;Reduced anterior laryngeal mobility;Reduced epiglottic inversion Pharyngeal Material enters airway, passes BELOW cords without attempt by patient to eject out (silent aspiration);Material enters airway, passes BELOW cords and not ejected out despite cough attempt by patient Pharyngeal- Thin Straw Delayed swallow initiation-pyriform sinuses;Reduced airway/laryngeal closure;Penetration/Aspiration before swallow Pharyngeal Material enters airway, passes BELOW cords without attempt by patient to eject out (silent aspiration);Material enters airway, passes BELOW cords and not ejected out despite cough attempt by patient Pharyngeal- Puree Delayed swallow initiation-vallecula Pharyngeal -- Pharyngeal- Mechanical Soft Delayed swallow initiation-vallecula;Reduced tongue base retraction;Pharyngeal residue - valleculae Pharyngeal -- Pharyngeal- Regular -- Pharyngeal -- Pharyngeal- Multi-consistency -- Pharyngeal -- Pharyngeal- Pill Reduced airway/laryngeal closure;Penetration/Aspiration before swallow;Penetration/Aspiration during swallow;Pharyngeal residue - valleculae Pharyngeal Material enters airway, remains ABOVE vocal cords and not ejected out Pharyngeal Comment --  CHL IP CERVICAL ESOPHAGEAL PHASE 05/27/2021 Cervical Esophageal Phase WFL Pudding Teaspoon -- Pudding Cup -- Honey Teaspoon -- Honey Cup -- Nectar Teaspoon -- Nectar Cup --  Nectar Straw -- Thin Teaspoon -- Thin Cup -- Thin Straw -- Puree -- Mechanical Soft -- Regular -- Multi-consistency -- Pill -- Cervical Esophageal Comment -- Chelsea E Hartness MA, CCC-SLP 05/27/2021, 2:16 PM              ECHOCARDIOGRAM COMPLETE  Result Date: 05/26/2021    ECHOCARDIOGRAM REPORT   Patient Name:   Deborah Carey Date of Exam: 05/26/2021 Medical Rec #:  [id number]     Height:       64.0 in Accession #:    [id number]    Weight:       211.6 lb Date of Birth:  02/15/1951      BSA:          2.004 m Patient Age:    70 years      BP:           129/61 mmHg Patient Gender: F             HR:           68 bpm. Exam Location:  Inpatient Procedure: 2D Echo, Cardiac Doppler, Color Doppler, Intracardiac Opacification            Agent and 3D Echo Indications:    Stroke I63.9  History:        Patient has no prior history of Echocardiogram examinations.                 Risk Factors:Hypertension. Acute respiratory insufficiency                   secondary to acute encephalopathy in the setting of CVA. Acute                 left MCA stroke s/p TNK and mechanical thrombectomy with                 complete revascularization. New diagnosis of atrial                 fibrillation, patient has converted to sinus rhythm.  Sonographer:    Darlina Sicilian RDCS Referring Phys: [id number] [place]  1. Left ventricular ejection fraction, by estimation, is 50 to 55%. The left ventricle has low normal function. The left ventricle demonstrates regional wall motion abnormalities - mild septal hypokinesis. There is mild left ventricular hypertrophy of the septal segment. Left ventricular diastolic parameters are indeterminate.  2. Right ventricular systolic function is normal. The right ventricular size is normal. There is normal pulmonary artery systolic pressure. The estimated right ventricular systolic pressure is 18.8 mmHg.  3. The mitral valve is normal in structure. Trivial mitral valve regurgitation. No evidence of  mitral stenosis.  4. Tricuspid valve regurgitation is mild to moderate.  5. The aortic valve is grossly normal. Aortic valve regurgitation is not visualized. No aortic stenosis is present.  6. The inferior vena cava is normal in size with <50% respiratory variability, suggesting right atrial pressure of 8 mmHg. FINDINGS  Left Ventricle: Left ventricular ejection fraction, by estimation, is 50 to 55%. The left ventricle has low normal function. The left ventricle demonstrates regional wall motion abnormalities. 3D left ventricular ejection fraction analysis performed but  not reported based on interpreter judgement due to suboptimal quality. The left ventricular internal cavity size was normal in size. There is mild left ventricular hypertrophy of the septal segment. Left ventricular diastolic parameters are indeterminate. Right Ventricle: The right ventricular size is normal. Right vetricular wall thickness was not well visualized. Right ventricular systolic function is normal. There is normal pulmonary artery systolic pressure. The tricuspid regurgitant velocity is 2.31 m/s, and with an assumed right atrial pressure of 8 mmHg, the estimated right ventricular systolic pressure is 41.6 mmHg. Left Atrium: Left atrial size was normal in size. Right Atrium: Right atrial size was normal in size. Pericardium: There is no evidence of pericardial effusion. Mitral Valve: The mitral valve is normal in structure. Mild mitral annular calcification. Trivial mitral valve regurgitation. No evidence of mitral valve stenosis. Tricuspid Valve: The tricuspid valve is normal in structure. Tricuspid valve regurgitation is mild to moderate. No evidence of tricuspid stenosis. Aortic Valve: The aortic valve is grossly normal. Aortic valve regurgitation is not visualized. No aortic stenosis is present. Pulmonic Valve: The pulmonic valve was not well visualized. Pulmonic valve regurgitation is trivial. No evidence of pulmonic stenosis.  Aorta: The aortic root is normal in size and structure. Venous: The inferior vena cava is normal in size with less than 50% respiratory variability, suggesting right atrial pressure of 8 mmHg. IAS/Shunts: No atrial level shunt detected by color flow Doppler.  LEFT VENTRICLE PLAX 2D LVIDd:         4.70 cm     Diastology LVIDs:         3.50 cm     LV e' medial:    10.10 cm/s LV PW:         0.80 cm     LV E/e' medial:  7.6 LV IVS:        1.10 cm  LV e' lateral:   9.87 cm/s LVOT diam:     2.00 cm     LV E/e' lateral: 7.7 LV SV:         44 LV SV Index:   22 LVOT Area:     3.14 cm  LV Volumes (MOD) LV vol d, MOD A2C: 76.2 ml LV vol d, MOD A4C: 57.2 ml LV vol s, MOD A2C: 20.0 ml LV vol s, MOD A4C: 12.7 ml LV SV MOD A2C:     56.2 ml LV SV MOD A4C:     57.2 ml LV SV MOD BP:      53.3 ml RIGHT VENTRICLE RV S prime:     12.30 cm/s TAPSE (M-mode): 1.7 cm LEFT ATRIUM             Index       RIGHT ATRIUM           Index LA diam:        4.00 cm 2.00 cm/m  RA Area:     12.10 cm LA Vol (A2C):   47.0 ml 23.45 ml/m RA Volume:   23.00 ml  11.48 ml/m LA Vol (A4C):   64.2 ml 32.04 ml/m LA Biplane Vol: 54.5 ml 27.20 ml/m  AORTIC VALVE LVOT Vmax:   71.68 cm/s LVOT Vmean:  45.300 cm/s LVOT VTI:    0.138 m  AORTA Ao Root diam: 3.00 cm Ao Asc diam:  2.80 cm MITRAL VALVE               TRICUSPID VALVE MV Area (PHT): 3.34 cm    TR Peak grad:   21.3 mmHg MV Decel Time: 227 msec    TR Vmax:        231.00 cm/s MV E velocity: 76.47 cm/s                            SHUNTS                            Systemic VTI:  0.14 m                            Systemic Diam: 2.00 cm Gayatri Acharya MD Electronically signed by Gayatri Acharya MD Signature Date/Time: 05/26/2021/5:49:00 PM    Final    IR PERCUTANEOUS ART THROMBECTOMY/INFUSION INTRACRANIAL INC DIAG ANGIO  Result Date: 05/25/2021 INDICATION: Deborah Carey is an 70 year-old female with a past medical history significant for osteoarthritis and cataracts who presented emergently to the ED at  [facility] due to right side weakness, with eyes drifting to left, and unable to talk with decreased level of consciousness. NIHSS at presentation was 19; baseline modified Rankin scale 0. Head CT showed no evidence of a large acute infarct or hemorrhage (ASPECTS 10). CTA showed a proximal left M1/MCA occlusion with good collaterals. She receive IV TNK and was then transferred to our service for a diagnostic cerebral angiogram and mechanical thrombectomy. EXAM: ULTRASOUND GUIDED VASCULAR ACCESS DIAGNOSTIC CEREBRAL ANGIOGRAM MECHANICAL THROMBECTOMY FLAT PANEL HEAD CT COMPARISON:  CT/CTA 05/25/2021. MEDICATIONS: No antibiotic administered. ANESTHESIA/SEDATION: The procedure was performed under general anesthesia. CONTRAST:  25 mL of Omnipaque 240 mg/mL FLUOROSCOPY TIME:  Fluoroscopy Time: 4 minutes 36 seconds (420 mGy). COMPLICATIONS: None immediate. TECHNIQUE: Informed written consent was obtained from the patient's husband after a thorough discussion of the procedural risks,   benefits and alternatives. All questions were addressed. Maximal Sterile Barrier Technique was utilized including caps, mask, sterile gowns, sterile gloves, sterile drape, hand hygiene and skin antiseptic. A timeout was performed prior to the initiation of the procedure. The right groin was prepped and draped in the usual sterile fashion. Using a micropuncture kit and the modified Seldinger technique, access was gained to the right common femoral artery and an 8 French sheath was placed. Real-time ultrasound guidance was utilized for vascular access including the acquisition of a permanent ultrasound image documenting patency of the accessed vessel. Under fluoroscopy, a Zoom 88 guide catheter was navigated over a 6 [place] Berenstein 2 catheter and a 0.035" Terumo Glidewire into the aortic arch. The catheter was placed into the left common carotid artery and then advanced into the left internal carotid artery. The Berenstein 2 catheter was removed.  Frontal and lateral angiograms of the head were obtained. FINDINGS: 1. Normal caliber of the right common femoral artery. 2. Occlusion of the left MCA at the proximal M1 segment. PROCEDURE: Under biplane roadmap, a zoom 71 aspiration catheter was navigated over an Aristotle 24 microguidewire into the cavernous segment of the left ICA. The aspiration catheter was then advanced to the level of occlusion and connected to an aspiration pump. Continuous aspiration was performed for 2 minutes. The guide catheter was connected to a VacLok syringe and advanced into the M1 segment while the aspiration catheter wasremoved under constant aspiration. The Zoom 88 guide catheter was continuously aspirated until clear blood return was obtained. Left ICA angiograms with frontal and lateral views showed complete left MCA vascular tree recanalization without evidence of thromboembolic complication. Flat panel CT of the head was obtained and post processed in a separate workstation with concurrent attending physician supervision. Selected images were sent to PACS. No evidence of hemorrhagic complication. A right common femoral artery angiogram was then obtained in right anterior oblique view via sheath side port. The puncture is at the level of the common femoral artery which has normal caliber and contour, adequate for closure device. The femoral sheath was exchanged over the wire for a Perclose ProStyle which was utilized for access closure. Immediate hemostasis was achieved. IMPRESSION: Successful and uncomplicated mechanical thrombectomy performed with direct contact aspiration for treatment of a proximal left M1/MCA occlusion with complete recanalization (TICI3). PLAN: 1. Transfer to ICA for continued care. 2. SBP 120-140 mmHg for 24 hours post procedure. 3. Bed rest for 6 hours. Electronically Signed   By: Pedro Earls M.D.   On: 05/25/2021 17:58   CT HEAD CODE STROKE WO CONTRAST  Result Date:  05/25/2021 CLINICAL DATA:  Left-sided weakness EXAM: CT ANGIOGRAPHY HEAD AND NECK TECHNIQUE: Multidetector CT imaging of the head and neck was performed using the standard protocol during bolus administration of intravenous contrast. Multiplanar CT image reconstructions and MIPs were obtained to evaluate the vascular anatomy. Carotid stenosis measurements (when applicable) are obtained utilizing NASCET criteria, using the distal internal carotid diameter as the denominator. CONTRAST:  57m OMNIPAQUE IOHEXOL 350 MG/ML SOLN COMPARISON:  None. FINDINGS: CT HEAD FINDINGS Brain: There is ill-defined hypodensity in the left centrum semiovale. The left basal ganglia and insular cortex are preserved. The cortex throughout the left MCA distribution is preserved. There is focal hypodensity in the right parietal lobe likely reflecting remote infarct. Additional foci of hypodensity in the bilateral cerebral hemispheres are nonspecific but may reflect sequela of chronic white matter microangiopathy. There is no evidence of acute intracranial hemorrhage or extra-axial  fluid collection. The ventricles are not enlarged. There is no mass lesion. There is no midline shift. Vascular: There is a dense MCA on the left. See below. Skull: Normal. Negative for fracture or focal lesion. Sinuses/orbits: The imaged paranasal sinuses are clear. The mastoid air cells are clear. Bilateral lens implants are in place. The globes and orbits are otherwise unremarkable. Review of the MIP images confirms the above findings CTA NECK FINDINGS The CTA images are moderately degraded by motion artifact. Aortic arch: Standard branching. Imaged portion shows no evidence of aneurysm or dissection. No significant stenosis of the major arch vessel origins. Right carotid system: The right carotid system is suboptimally evaluated particularly in the region of the carotid bulb due to motion artifact. The right internal carotid artery has a medialized course.  Otherwise, there is no definite focal stenosis, occlusion, dissection, or aneurysm. Left carotid system: The left carotid system is suboptimally evaluated due to motion artifact particularly in the region of the carotid bulb. There is calcified atherosclerotic plaque of the left carotid bulb without definite hemodynamically significant stenosis or occlusion. There is no definite dissection or aneurysm. Vertebral arteries: The vertebral arteries appear patent, without definite focal stenosis, dissection, occlusion, or aneurysm. Skeleton: There is mild multilevel degenerative change of the cervical spine. There is no acute osseous abnormality or aggressive osseous lesion. Other neck: The soft tissues are unremarkable. Upper chest: There is a 1.1 cm x 1.0 cm soft tissue nodule in the medial right apex. Review of the MIP images confirms the above findings CTA HEAD FINDINGS Anterior circulation: There is calcification of the bilateral cavernous ICAs without focal stenosis or occlusion. There is occlusion of the proximal left M1 segment with reconstitution of flow at the M2 segments. There appears to be good collateral flow throughout the peripheral MCA distribution. The right MCA is patent. The bilateral ACAs are patent. The right A1 segment is diminutive. There is no aneurysm. Posterior circulation: The posterior circulation is suboptimally evaluated due to motion artifact. The V4 segments of the vertebral arteries are patent. The basilar artery is patent. There is multifocal moderate stenosis of the left P2 segment (9-256, 10-116, 10-123). The right PCA is patent.  There is no aneurysm. Venous sinuses: As permitted by contrast timing, patent. Anatomic variants: None. Review of the MIP images confirms the above findings IMPRESSION: 1. Occlusion of the left M1 segment with reconstitution of flow at the M2 segments with good collateral flow throughout the remainder of the MCA distribution. 2. No evidence of left MCA  territory infarct at the time of imaging. Aspects is 10. 3. The CTA images are moderately motion degraded. There is no definite high-grade stenosis in the neck. 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 4. 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 Other foci of hypodensity in the supratentorial white matter likely reflects sequela of chronic white matter microangiopathy. 5. 1.1 cm soft tissue nodule in the medial right apex. Consider one of the following in 3 months for both low-risk and high-risk individuals: (a) repeat chest CT, (b) follow-up PET-CT, or (c) tissue sampling. This recommendation follows the consensus statement: Guidelines for Management of Incidental Pulmonary Nodules Detected on CT Images: From the Fleischner Society 2017; Radiology 2017; 284:228-243. The acute results were called by telephone at the time of interpretation on 05/25/2021 at 3:05 pm to provider Dr Cheral Marker , who verbally acknowledged these results. Electronically Signed   By: Valetta Mole M.D.   On: 05/25/2021  15:22   CT ANGIO HEAD NECK W WO CM (CODE STROKE)  Result Date: 05/25/2021 CLINICAL DATA:  Left-sided weakness EXAM: CT ANGIOGRAPHY HEAD AND NECK TECHNIQUE: Multidetector CT imaging of the head and neck was performed using the standard protocol during bolus administration of intravenous contrast. Multiplanar CT image reconstructions and MIPs were obtained to evaluate the vascular anatomy. Carotid stenosis measurements (when applicable) are obtained utilizing NASCET criteria, using the distal internal carotid diameter as the denominator. CONTRAST:  75mL OMNIPAQUE IOHEXOL 350 MG/ML SOLN COMPARISON:  None. FINDINGS: CT HEAD FINDINGS Brain: There is ill-defined hypodensity in the left centrum semiovale. The left basal ganglia and insular cortex are preserved. The cortex throughout the left MCA distribution is preserved. There is focal hypodensity in the right parietal lobe likely reflecting  remote infarct. Additional foci of hypodensity in the bilateral cerebral hemispheres are nonspecific but may reflect sequela of chronic white matter microangiopathy. There is no evidence of acute intracranial hemorrhage or extra-axial fluid collection. The ventricles are not enlarged. There is no mass lesion. There is no midline shift. Vascular: There is a dense MCA on the left. See below. Skull: Normal. Negative for fracture or focal lesion. Sinuses/orbits: The imaged paranasal sinuses are clear. The mastoid air cells are clear. Bilateral lens implants are in place. The globes and orbits are otherwise unremarkable. Review of the MIP images confirms the above findings CTA NECK FINDINGS The CTA images are moderately degraded by motion artifact. Aortic arch: Standard branching. Imaged portion shows no evidence of aneurysm or dissection. No significant stenosis of the major arch vessel origins. Right carotid system: The right carotid system is suboptimally evaluated particularly in the region of the carotid bulb due to motion artifact. The right internal carotid artery has a medialized course. Otherwise, there is no definite focal stenosis, occlusion, dissection, or aneurysm. Left carotid system: The left carotid system is suboptimally evaluated due to motion artifact particularly in the region of the carotid bulb. There is calcified atherosclerotic plaque of the left carotid bulb without definite hemodynamically significant stenosis or occlusion. There is no definite dissection or aneurysm. Vertebral arteries: The vertebral arteries appear patent, without definite focal stenosis, dissection, occlusion, or aneurysm. Skeleton: There is mild multilevel degenerative change of the cervical spine. There is no acute osseous abnormality or aggressive osseous lesion. Other neck: The soft tissues are unremarkable. Upper chest: There is a 1.1 cm x 1.0 cm soft tissue nodule in the medial right apex. Review of the MIP images  confirms the above findings CTA HEAD FINDINGS Anterior circulation: There is calcification of the bilateral cavernous ICAs without focal stenosis or occlusion. There is occlusion of the proximal left M1 segment with reconstitution of flow at the M2 segments. There appears to be good collateral flow throughout the peripheral MCA distribution. The right MCA is patent. The bilateral ACAs are patent. The right A1 segment is diminutive. There is no aneurysm. Posterior circulation: The posterior circulation is suboptimally evaluated due to motion artifact. The V4 segments of the vertebral arteries are patent. The basilar artery is patent. There is multifocal moderate stenosis of the left P2 segment (9-256, 10-116, 10-123). The right PCA is patent.  There is no aneurysm. Venous sinuses: As permitted by contrast timing, patent. Anatomic variants: None. Review of the MIP images confirms the above findings IMPRESSION: 1. Occlusion of the left M1 segment with reconstitution of flow at the M2 segments with good collateral flow throughout the remainder of the MCA distribution. 2. No evidence of left MCA   territory infarct at the time of imaging. Aspects is 10. 3. The CTA images are moderately motion degraded. There is no definite high-grade stenosis in the neck. 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 4. 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 Other foci of hypodensity in the supratentorial white matter likely reflects sequela of chronic white matter microangiopathy. 5. 1.1 cm soft tissue nodule in the medial right apex. Consider one of the following in 3 months for both low-risk and high-risk individuals: (a) repeat chest CT, (b) follow-up PET-CT, or (c) tissue sampling. This recommendation follows the consensus statement: Guidelines for Management of Incidental Pulmonary Nodules Detected on CT Images: From the Fleischner Society 2017; Radiology 2017; 284:228-243. The acute results were  called by telephone at the time of interpretation on 05/25/2021 at 3:05 pm to provider Dr Lindzen , who verbally acknowledged these results. Electronically Signed   By: Peter  Noone M.D.   On: 05/25/2021 15:22       HISTORY OF PRESENT ILLNESS Deborah Carey is a 70 y.o. female with history of anxiety and HLD admitted for right-sided weakness, left gaze, aphasia and altered mental status. TNK given.   HOSPITAL COURSE Stroke:  left MCA moderate infarct in the right MCA and ACA punctate infarcts with left M1 occlusion s/p TNK and IR with TICI3, embolic secondary to new diagnosed A. fib CT head no acute abnormality, old right parietal small infarct CT head and neck left M1 occlusion left M2 reconstitution.  Left P2 stenosis MRI left MCA infarcts more prominent at left BG and caudate head, right MCA and ACA punctate infarcts MRA left MCA patent now 2D Echo EF 50 to 55%, left atrial size normal LDL 109 HgbA1c 5.8 SCDs for VTE prophylaxis No antithrombotic prior to admission, now on Eliquis Ongoing aggressive stroke risk factor management Therapy recommendations: CIR Disposition: Pending   A. Fib New diagnosis EKG confirmed Cardiology consulted, appreciate assistance Rate controlled Eliquis started 05/29/2021 Hypotension, improved BP stable S/p bolus and one dose of albumin  BP < 180/105 Long term BP goal normotensive   Hyperlipidemia Home meds: None LDL 109, goal < 70 On Lipitor 40 Continue statin at discharge   Dysphagia Did not pass bedside swallow N.p.o. with sip for meds Pending MBS Speech on board   Other Stroke Risk Factors Advanced age Obesity, Body mass index is 36.33 kg/m.  Hx stroke/TIA on imaging   Other Active Problems CT head and neck showed right lung apex 1cm soft tissue nodule - need outpatient follow-up  Per Radiology Consider one of the following in 3 months for both low-risk and high-risk individuals: (a) repeat chest CT, (b) follow-up PET-CT, or (c)  tissue sampling.     DISCHARGE EXAM Blood pressure 123/75, pulse 65, temperature 98.3 F (36.8 C), temperature source Oral, resp. rate 17, height 5' 4" (1.626 m), weight 96 kg, last menstrual period 09/05/2008, SpO2 98 %. General - Well nourished, well developed, elderly Caucasian lady in no apparent distress.   Ophthalmologic - fundi not visualized due to noncooperation.   Cardiovascular - irregularly irregular heart rate and rhythm.   Neuro - awake, alert, eyes open, orientated to place, age, months and people, but not to year.  Mild expressive aphasia occasional word finding difficulty and paraphasic errors, however, following all simple commands. Able to repeat and naming 2/4.  Mild dysarthria.  No gaze palsy, tracking bilaterally, blinking to visual threat bilaterally, PERRL.  Mild right facial droop. Tongue   midline.  Left upper extremity 4/5 and right upper extremity no drift 4-/5.  Bilateral lower extremity proximal 3+/5 proximal and 4/5 distal.  Sensation symmetrical bilaterally, b/l FTN intact although slow on the right, gait not tested.   Discharge Diet      Diet   DIET DYS 3 Room service appropriate? Yes with Assist; Fluid consistency: Nectar Thick   liquids  DISCHARGE PLAN Disposition:  Transfer to [facility] for ongoing PT, OT and ST Eliquis (apixaban) daily for secondary stroke prevention . Recommend ongoing stroke risk factor control by Primary Care Physician at time of discharge from inpatient rehabilitation. Follow-up PCP Tower, Marne A, MD in 2 weeks following discharge from rehab. Follow-up in [facility] in 4 weeks following discharge from rehab, office to schedule an appointment.   33 minutes were spent preparing discharge.  Jessica Williams, MSN, NP-C [facility] [phone]  I have personally obtained history,examined this patient, reviewed notes, independently viewed imaging studies, participated  in medical decision making and plan of care.ROS completed by me personally and pertinent positives fully documented  I have made any additions or clarifications directly to the above note. Agree with note above.     , MD Medical Director [facility] Pager: [phone] 06/01/2021 3:24 PM    

## 2021-06-01 NOTE — Progress Notes (Signed)
Pt transferred to CIR at this time with all belongings including an ipad.  Pt did not have celll phone with her.

## 2021-06-02 DIAGNOSIS — I63512 Cerebral infarction due to unspecified occlusion or stenosis of left middle cerebral artery: Secondary | ICD-10-CM | POA: Diagnosis not present

## 2021-06-02 LAB — CBC WITH DIFFERENTIAL/PLATELET
Abs Immature Granulocytes: 0.04 10*3/uL (ref 0.00–0.07)
Basophils Absolute: 0.1 10*3/uL (ref 0.0–0.1)
Basophils Relative: 1 %
Eosinophils Absolute: 0.2 10*3/uL (ref 0.0–0.5)
Eosinophils Relative: 2 %
HCT: 37.7 % (ref 36.0–46.0)
Hemoglobin: 13.3 g/dL (ref 12.0–15.0)
Immature Granulocytes: 0 %
Lymphocytes Relative: 25 %
Lymphs Abs: 2.5 10*3/uL (ref 0.7–4.0)
MCH: 32.3 pg (ref 26.0–34.0)
MCHC: 35.3 g/dL (ref 30.0–36.0)
MCV: 91.5 fL (ref 80.0–100.0)
Monocytes Absolute: 1 10*3/uL (ref 0.1–1.0)
Monocytes Relative: 9 %
Neutro Abs: 6.6 10*3/uL (ref 1.7–7.7)
Neutrophils Relative %: 63 %
Platelets: 219 10*3/uL (ref 150–400)
RBC: 4.12 MIL/uL (ref 3.87–5.11)
RDW: 12.2 % (ref 11.5–15.5)
WBC: 10.3 10*3/uL (ref 4.0–10.5)
nRBC: 0 % (ref 0.0–0.2)

## 2021-06-02 LAB — COMPREHENSIVE METABOLIC PANEL
ALT: 15 U/L (ref 0–44)
AST: 16 U/L (ref 15–41)
Albumin: 3.4 g/dL — ABNORMAL LOW (ref 3.5–5.0)
Alkaline Phosphatase: 53 U/L (ref 38–126)
Anion gap: 6 (ref 5–15)
BUN: 17 mg/dL (ref 8–23)
CO2: 27 mmol/L (ref 22–32)
Calcium: 9 mg/dL (ref 8.9–10.3)
Chloride: 103 mmol/L (ref 98–111)
Creatinine, Ser: 0.84 mg/dL (ref 0.44–1.00)
GFR, Estimated: >60 mL/min (ref >60)
Glucose, Bld: 111 mg/dL — ABNORMAL HIGH (ref 70–99)
Potassium: 4.2 mmol/L (ref 3.5–5.1)
Sodium: 136 mmol/L (ref 135–145)
Total Bilirubin: 1 mg/dL (ref 0.3–1.2)
Total Protein: 6.1 g/dL — ABNORMAL LOW (ref 6.5–8.1)

## 2021-06-02 MED ORDER — ACETAMINOPHEN 325 MG PO TABS: 325.0000 mg | ORAL_TABLET | ORAL | Status: AC | PRN

## 2021-06-02 NOTE — Care Management (Signed)
[place] Individual Statement of Services  Patient Name:  Deborah Carey [facility]  Date:  06/02/2021  Welcome to the [facility].  Our goal is to provide you with an individualized program based on your diagnosis and situation, designed to meet your specific needs.  With this comprehensive rehabilitation program, you will be expected to participate in at least 3 hours of rehabilitation therapies Monday-Friday, with modified therapy programming on the weekends.  Your rehabilitation program will include the following services:  Physical Therapy (PT), Occupational Therapy (OT), Speech Therapy (ST), 24 hour per day rehabilitation nursing, Therapeutic Recreaction (TR), Psychology, Neuropsychology, Care Coordinator, Rehabilitation Medicine, [place], and Other  Weekly team conferences will be held on Tuesdays to discuss your progress.  Your Inpatient Rehabilitation Care Coordinator will talk with you frequently to get your input and to update you on team discussions.  Team conferences with you and your family in attendance may also be held.  Expected length of stay: 5-7 days    Overall anticipated outcome: Independent with An Assistive Device  Depending on your progress and recovery, your program may change. Your Inpatient Rehabilitation Care Coordinator will coordinate services and will keep you informed of any changes. Your Inpatient Rehabilitation Care Coordinator's name and contact numbers are listed  below.  The following services may also be recommended but are not provided by the [facility] will be made to provide these services after discharge if needed.  Arrangements include referral to agencies that provide these services.  Your insurance has been verified to be:  [employer]  Your primary doctor is:  Deborah Carey  Pertinent information will be shared with your doctor and your insurance company.  Inpatient Rehabilitation Care Coordinator:  Cathleen Corti [phone] or ([phone]  Information discussed with and copy given to patient by: Rana Snare, 06/02/2021, 3:57 PM

## 2021-06-02 NOTE — Progress Notes (Signed)
Physical Therapy Session Note  Patient Details  Name: Deborah Carey MRN: [id number] Date of Birth: Oct 26, 1950  Today's Date: 06/02/2021 PT Individual Time: 7493-5521 PT Individual Time Calculation (min): 45 min   Short Term Goals: Week 1:  PT Short Term Goal 1 (Week 1): =LTG due to ELOS  Skilled Therapeutic Interventions/Progress Updates:  Pt received seated in recliner in room, agreeable to PT session. No complaints of pain. Sit to stand with CGA and no AD throughout session. Pt taken outdoors for gait training in functional environment. Ambulation up to 1000 ft during session at CGA level, occasional need for min A with LOB that pt able to correct with min A. Ambulation up/down inclines, across uneven ground, up/down curbs with CGA to min A overall. Pt exhibits some mild R inattention in functional environment. Ascend/descend 4 x 6" stairs with one handrail and min A for balance, cues for safety. Pt reports feeling significantly fatigued at end of session. Pt left seated in recliner in room with needs in reach, chair alarm in place.  Therapy Documentation Precautions:  Precautions Precautions: Fall Precaution Comments: right sided weakness Restrictions Weight Bearing Restrictions: No  Therapy/Group: Individual Therapy   Excell Seltzer, PT, DPT, CSRS  06/02/2021, 3:44 PM

## 2021-06-02 NOTE — Evaluation (Signed)
Occupational Therapy Assessment and Plan  Patient Details  Name: Deborah Carey MRN: [id number] Date of Birth: [birth year]  OT Diagnosis: cognitive deficits, hemiplegia affecting dominant side, and muscle weakness (generalized) Rehab Potential: Rehab Potential (ACUTE ONLY): Good ELOS: 5-7 days   Today's Date: 06/02/2021 OT Individual Time: 0700-0800 OT Individual Time Calculation (min): 60 min     Hospital Problem: Principal Problem:   Acute ischemic left middle cerebral artery (MCA) stroke ([place])   Past Medical History:  Past Medical History:  Diagnosis Date   Allergy    allergic rhinitis   Anxiety    Cataract    removed both eyes    Infertility, female    Osteoarthritis of both knees    OA    PONV (postoperative nausea and vomiting)    Post-menopausal bleeding    neg endo bx.   Past Surgical History:  Past Surgical History:  Procedure Laterality Date   cataract surgery  2011   CESAREAN SECTION     times 2   COLONOSCOPY  2010   hems    ENDOMETRIAL BIOPSY     neg for CA cells   FACIAL COSMETIC SURGERY  2006   face lift   FOOT SURGERY  1998   rt heel spur removal   IR CT HEAD LTD  05/25/2021   IR PERCUTANEOUS ART THROMBECTOMY/INFUSION INTRACRANIAL INC DIAG ANGIO  05/25/2021   IR US GUIDE VASC ACCESS RIGHT  05/25/2021   RADIOLOGY WITH ANESTHESIA N/A 05/25/2021   Procedure: IR WITH ANESTHESIA;  Surgeon: Aletta Edouard, MD;  Location: [facility];  Service: Radiology;  Laterality: N/A;   REFRACTIVE SURGERY  08/1999    Assessment & Plan Clinical Impression: Deborah Carey. Deborah Carey is a 70 year old female with history of OA, anxiety who was admitted to [facility] 05/25/21 with acute on set of right sided weakness, left ward gaze and inability to speak. CTA head showed occlusion of L-M1 MCA with large L-MCA ischemia as well as incidental 1.1 cm soft tissue nodule medial right apex --CT chest recommended in 3 months.  She developed acute respiratory failure requiring intubation prior to transfer to  [facility] for intervention. She received TNK and underwent cerebral angio with thrombectomy of proximal L-M1/MCA occlusion with completed recanalization. Post procedure had episode of PAF, hypotension treated with IVF/albumin as well as episode of emesis/abdominal pain. CT abdomen/pelvis showed cholelithiasis without cystitis and scattered colonic diverticula without acute diverticulitis. 2D echo showed EF 50-55% showed mild to moderate TVR and mild septal hypokinesis.    MRA brain showed patency of L-MCA branches and stenotic disease both PCA P2 segments. MRI brain showed Left MCA infarct prominent in L-putamen and caudate head, with minimal petechial blood products and mild swelling, three punctate infarct in right frontal lobe and probable microemboli in R-ACA as well as  old right parietal cortical/subcortical infarcts. Dr. Irish Lack consulted for input and recommended starting [facility] once cleared by neuro. MBS done revealing delay in swallow with mild to moderate pharyngeal dysphagia--->on D3, nectar liquids. Dr. Erlinda Hong felt that stroke embolic due to newly diagnosed A fib. She was started on ASA 325 mg and transitioned to [place] on 09/24.  She is showing improvement in verbal output but continues to be limited by STM deficits, RLE weakness, cognitive impairments with  BLE>BUE weakness as well as dysphagia with noncompliance with thickened liquids.  Patient transferred to CIR on 06/01/2021 .    Patient currently requires min with basic self-care skills and IADL secondary to impaired timing and  sequencing, unbalanced muscle activation, decreased coordination, and decreased motor planning and decreased attention, decreased awareness, decreased problem solving, decreased safety awareness, decreased memory, and delayed processing.  Prior to hospitalization, patient could complete ADLs/IADLs with independence.  Patient will benefit from skilled intervention to decrease level of assist with basic self-care skills, increase  independence with basic self-care skills, and increase level of independence with iADL prior to discharge home independently.  Anticipate patient will require intermittent supervision and follow up outpatient.  OT - End of Session Activity Tolerance: Tolerates 30+ min activity with multiple rests Endurance Deficit: Yes Endurance Deficit Description: Mildly decreased activity tolerance. OT Assessment Rehab Potential (ACUTE ONLY): Good OT Barriers to Discharge: Inaccessible home environment OT Barriers to Discharge Comments: 4 STE without rails. OT Patient demonstrates impairments in the following area(s): Balance;Cognition;Endurance;Motor;Safety OT Basic ADL's Functional Problem(s): Grooming;Bathing;Dressing;Toileting OT Advanced ADL's Functional Problem(s): Simple Meal Preparation;Laundry;Light Housekeeping OT Transfers Functional Problem(s): Toilet;Tub/Shower OT Plan OT Intensity: Minimum of 1-2 x/day, 45 to 90 minutes OT Frequency: 5 out of 7 days OT Duration/Estimated Length of Stay: 5-7 days OT Treatment/Interventions: Balance/vestibular training;Cognitive remediation/compensation;Community reintegration;Discharge planning;DME/adaptive equipment instruction;Functional mobility training;Neuromuscular re-education;Patient/family education;Self Care/advanced ADL retraining;Therapeutic Activities;Therapeutic Exercise;UE/LE Strength taining/ROM;UE/LE Coordination activities OT Self Feeding Anticipated Outcome(s): I OT Basic Self-Care Anticipated Outcome(s): I OT Toileting Anticipated Outcome(s): I OT Bathroom Transfers Anticipated Outcome(s): I OT Recommendation Recommendations for Other Services: Speech consult Patient destination: Home Follow Up Recommendations: Outpatient OT Equipment Recommended: To be determined   OT Evaluation Precautions/Restrictions  Precautions Precautions: Fall Precaution Comments: right sided weakness Restrictions Weight Bearing Restrictions:  No General Chart Reviewed: No Family/Caregiver Present: No Vital Signs Therapy Vitals Temp: 98 F (36.7 C) Temp Source: Oral Pulse Rate: 75 Resp: 18 BP: 129/62 Patient Position (if appropriate): Lying Oxygen Therapy SpO2: 100 % O2 Device: Room Air Pain Pain Assessment Pain Scale: 0-10 Pain Score: 0-No pain Home Living/Prior Functioning Home Living Family/patient expects to be discharged to:: Private residence Living Arrangements: Spouse/significant other Available Help at Discharge: Family, Available 24 hours/day Type of Home: House Home Access: Stairs to enter Technical brewer of Steps: 4 Entrance Stairs-Rails: None Home Layout: Two level, Able to live on main level with bedroom/bathroom Bathroom Shower/Tub: Multimedia programmer: Standard  Lives With: Spouse IADL History Homemaking Responsibilities: Yes Meal Prep Responsibility: Primary Laundry Responsibility: Primary Cleaning Responsibility: Primary Bill Paying/Finance Responsibility: Secondary Shopping Responsibility: Primary Child Care Responsibility: No Occupation: Retired Prior Function Level of Independence: Independent with basic ADLs, Independent with transfers, Independent with homemaking with ambulation Driving: Yes Vocation: Retired Biomedical scientist: Education officer, museum (kindergarten, 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) Comments: driving Vision Baseline Vision/History: 1 Wears glasses (Readers) Patient Visual Report: No change from baseline Vision Assessment?: Yes Eye Alignment: Within Functional Limits Ocular Range of Motion: Within Functional Limits Tracking/Visual Pursuits: Able to track stimulus in all quads without difficulty Saccades: Within functional limits Convergence: Within functional limits Visual Fields: No apparent deficits Perception  Perception: Within Functional Limits Praxis Praxis: Intact Cognition Overall Cognitive Status: Within Functional Limits for tasks  assessed Arousal/Alertness: Awake/alert Orientation Level: Person;Place;Situation Person: Oriented Place: Oriented Year: 2022 Month: September Day of Week: Correct Memory: Impaired Memory Impairment: Decreased short term memory Decreased Short Term Memory: Verbal complex Immediate Memory Recall: Sock;Blue;Bed Memory Recall Sock: Without Cue Memory Recall Blue: Without Cue Memory Recall Bed: With Cue Attention: Selective Selective Attention: Impaired Selective Attention Impairment: Functional complex;Verbal complex Awareness: Impaired Awareness Impairment: Anticipatory impairment Problem Solving: Impaired Problem Solving Impairment: Functional complex Executive Function: Organizing;Self Monitoring Organizing: Impaired Organizing  Impairment: Functional complex Self Monitoring: Impaired Self Monitoring Impairment: Functional complex Safety/Judgment: Appears intact Sensation Sensation Light Touch: Appears Intact Coordination Gross Motor Movements are Fluid and Coordinated: Yes Fine Motor Movements are Fluid and Coordinated: No Finger Nose Finger Test: Mildly impaired on R Motor  Motor Motor: Hemiplegia Motor - Skilled Clinical Observations: Mild R hemi  Trunk/Postural Assessment  Cervical Assessment Cervical Assessment: Within Functional Limits Thoracic Assessment Thoracic Assessment: Exceptions to WFL (Kyphotic) Postural Control Postural Control: Deficits on evaluation (Delayed)  Balance Balance Balance Assessed: Yes Static Sitting Balance Static Sitting - Balance Support: No upper extremity supported;Feet supported Static Sitting - Level of Assistance: 7: Independent Dynamic Sitting Balance Dynamic Sitting - Balance Support: No upper extremity supported;Feet supported Dynamic Sitting - Level of Assistance: 5: Stand by assistance Sitting balance - Comments: Able to don LB clothing seated EOB. Reaches outside of BOS without LOB. Static Standing Balance Static  Standing - Balance Support: No upper extremity supported;During functional activity Static Standing - Level of Assistance: 5: Stand by assistance Static Standing - Comment/# of Minutes: Completes grooming tasks standing at sink for more than 3-5 min without UE support or LOB. Static Stance: Eyes closed Static Stance: Eyes Closed: Ligh lateral sway. No LOB. Dynamic Standing Balance Dynamic Standing - Balance Support: No upper extremity supported Dynamic Standing - Level of Assistance: 5: Stand by assistance Dynamic Standing - Balance Activities: Lateral lean/weight shifting;Forward lean/weight shifting Dynamic Standing - Comments: No formal assessment; howerver observed with functional assessment of ADLs. Extremity/Trunk Assessment RUE Assessment RUE Body System: Neuro Brunstrum levels for arm and hand: Arm;Hand Brunstrum level for arm: Stage V Relative Independence from Synergy Brunstrum level for hand: Stage V Independence from basic synergies LUE Assessment LUE Assessment: Within Functional Limits Passive Range of Motion (PROM) Comments: WFL Active Range of Motion (AROM) Comments: WFL General Strength Comments: MMT grossly 5/5  Care Tool Care Tool Self Care Eating   Eating Assist Level: Independent    Oral Care    Oral Care Assist Level: Supervision/Verbal cueing (Standing)    Bathing   Body parts bathed by patient: Right arm;Left arm;Chest;Abdomen;Front perineal area;Buttocks;Right upper leg;Left upper leg;Right lower leg;Left lower leg;Face     Assist Level: Contact Guard/Touching assist    Upper Body Dressing(including orthotics)   What is the patient wearing?: Pull over shirt   Assist Level: Set up assist    Lower Body Dressing (excluding footwear)   What is the patient wearing?: Underwear/pull up;Pants Assist for lower body dressing: Contact Guard/Touching assist    Putting on/Taking off footwear   What is the patient wearing?: Socks;Shoes Assist for footwear:  Set up assist       Care Tool Toileting Toileting activity   Assist for toileting: Contact Guard/Touching assist     Care Tool Bed Mobility Roll left and right activity   Roll left and right assist level: Independent    Sit to lying activity   Sit to lying assist level: Supervision/Verbal cueing    Lying to sitting on side of bed activity   Lying to sitting on side of bed assist level: the ability to move from lying on the back to sitting on the side of the bed with no back support.: Supervision/Verbal cueing     Care Tool Transfers Sit to stand transfer   Sit to stand assist level: Contact Guard/Touching assist    Chair/bed transfer   Chair/bed transfer assist level: Contact Guard/Touching assist     Toilet transfer   Assist Level:  Contact Guard/Touching assist     Care Tool Cognition  Expression of Ideas and Wants Expression of Ideas and Wants: 3. Some difficulty - exhibits some difficulty with expressing needs and ideas (e.g, some words or finishing thoughts) or speech is not clear  Understanding Verbal and Non-Verbal Content Understanding Verbal and Non-Verbal Content: 3. Usually understands - understands most conversations, but misses some part/intent of message. Requires cues at times to understand   Memory/Recall Ability Memory/Recall Ability : Current season;Location of own room;That he or she is in a hospital/hospital unit   Refer to Care Plan for [place] 1 OT Short Term Goal 1 (Week 1): STG= LTG 2/2 ELOS  Recommendations for other services: Other: TBD    Skilled Therapeutic Intervention Patient met lying supine in bed. Education provide on role of OT, purpose of CIR, goals and ELOS. Patient expressed verbal understanding. Bed mobility with supervision A, sit to stand with Min guard for steadying and toilet transfer with Min guard. Patient completed 3/3 parts of toileting task seated on standard height commode in bathroom. Walk-in  shower transfer with Min guard without AD. Patient completed bathing/dressing at shower level seated on tub bench with set-up for UB, supervision A at best and Min guard at most for LB. Patient limited by mild R hemiparesis, decreased coordination and mild deficits in activity tolerance. Patient would benefit from continued occupational therapy services to maximize safety/independence with ADLs/IADLs in prep for safe d/c home.   ADL ADL Eating: Independent Where Assessed-Eating: Edge of bed Grooming: Supervision/safety Where Assessed-Grooming: Standing at sink Upper Body Bathing: Supervision/safety Where Assessed-Upper Body Bathing: Shower Lower Body Bathing: Contact guard Where Assessed-Lower Body Bathing: Shower Upper Body Dressing: Setup Where Assessed-Upper Body Dressing: Edge of bed Lower Body Dressing: Contact guard Where Assessed-Lower Body Dressing: Edge of bed Toileting: Contact guard Where Assessed-Toileting: Glass blower/designer: Therapist, music Method: Mudlogger: Civil engineer, contracting with back Nutritional therapist Method: Heritage manager: Civil engineer, contracting with back Mobility  Bed Mobility Bed Mobility: Rolling Right;Rolling Left;Supine to [employer] Right: Independent Rolling Left: Independent Supine to Sit: Supervision/Verbal cueing Transfers Sit to Stand: Contact Guard/Touching assist   Discharge Criteria: Patient will be discharged from OT if patient refuses treatment 3 consecutive times without medical reason, if treatment goals not met, if there is a change in medical status, if patient makes no progress towards goals or if patient is discharged from hospital.  The above assessment, treatment plan, treatment alternatives and goals were discussed and mutually agreed upon: by patient   R Howerton-Davis 06/02/2021, 7:55 AM

## 2021-06-02 NOTE — Evaluation (Signed)
Physical Therapy Assessment and Plan  Patient Details  Name: Deborah Carey MRN: [id number] Date of Birth: October 05, 1950  PT Diagnosis: Abnormality of gait, Ataxic gait, Cognitive deficits, Hemiplegia dominant, and Impaired cognition Rehab Potential: Excellent ELOS: 5-7 days   Today's Date: 06/02/2021 PT Individual Time: 1100-1200 PT Individual Time Calculation (min): 60 min    Hospital Problem: Principal Problem:   Acute ischemic left middle cerebral artery (MCA) stroke ([place])   Past Medical History:  Past Medical History:  Diagnosis Date   Allergy    allergic rhinitis   Anxiety    Cataract    removed both eyes    Infertility, female    Osteoarthritis of both knees    OA    PONV (postoperative nausea and vomiting)    Post-menopausal bleeding    neg endo bx.   Past Surgical History:  Past Surgical History:  Procedure Laterality Date   cataract surgery  2011   CESAREAN SECTION     times 2   COLONOSCOPY  2010   hems    ENDOMETRIAL BIOPSY     neg for CA cells   FACIAL COSMETIC SURGERY  2006   face lift   FOOT SURGERY  1998   rt heel spur removal   IR CT HEAD LTD  05/25/2021   IR PERCUTANEOUS ART THROMBECTOMY/INFUSION INTRACRANIAL INC DIAG ANGIO  05/25/2021   IR US GUIDE VASC ACCESS RIGHT  05/25/2021   RADIOLOGY WITH ANESTHESIA N/A 05/25/2021   Procedure: IR WITH ANESTHESIA;  Surgeon: Aletta Edouard, MD;  Location: [place];  Service: Radiology;  Laterality: N/A;   REFRACTIVE SURGERY  08/1999    Assessment & Plan Clinical Impression:  [name]. Martindelcampo is a 70 year old female with history of OA, anxiety who was admitted to  [facility] 05/25/21 with acute on set of right sided weakness, left ward gaze and inability to speak. CTA head showed occlusion of L-M1 MCA with large L-MCA ischemia as well as incidental 1.1 cm soft tissue nodule medial right apex --CT chest recommended in 3 months.  She developed acute respiratory failure requiring intubation prior to transfer to [facility] for  intervention. She received TNK and underwent cerebral angio with thrombectomy of proximal L-M1/MCA occlusion with completed recanalization. Post procedure had episode of PAF, hypotension treated with IVF/albumin as well as episode of emesis/abdominal pain. CT abdomen/pelvis showed cholelithiasis without cystitis and scattered colonic diverticula without acute diverticulitis. 2D echo showed EF 50-55% showed mild to moderate TVR and mild septal hypokinesis.    MRA brain showed patency of L-MCA branches and stenotic disease both PCA P2 segments. MRI brain showed Left MCA infarct prominent in L-putamen and caudate head, with minimal petechial blood products and mild swelling, three punctate infarct in right frontal lobe and probable microemboli in R-ACA as well as  old right parietal cortical/subcortical infarcts. Dr. Irish Lack consulted for input and recommended starting [facility] once cleared by neuro. MBS done revealing delay in swallow with mild to moderate pharyngeal dysphagia--->on D3, nectar liquids. Dr. Erlinda Hong felt that stroke embolic due to newly diagnosed A fib. She was started on ASA 325 mg and transitioned to [place] on 09/24.  She is showing improvement in verbal output but continues to be limited by STM deficits, RLE weakness, cognitive impairments with  BLE>BUE weakness as well as dysphagia with noncompliance with thickened liquids. CIR recommended due to functional decline. Patient transferred to CIR on 06/01/2021 .   Patient currently requires min with mobility secondary to decreased cardiorespiratoy endurance, ataxia and  decreased coordination, decreased attention, decreased awareness, decreased problem solving, and decreased memory, and decreased standing balance, decreased postural control, hemiplegia, and decreased balance strategies.  Prior to hospitalization, patient was independent  with mobility and lived with Spouse in a House home.  Home access is 5Stairs to enter.  Patient will benefit from skilled PT  intervention to maximize safe functional mobility, minimize fall risk, and decrease caregiver burden for planned discharge home with 24 hour supervision.  Anticipate patient will benefit from follow up OP at discharge.  PT - End of Session Activity Tolerance: Tolerates 30+ min activity with multiple rests Endurance Deficit: Yes Endurance Deficit Description: rest breaks during functional activity PT Assessment Rehab Potential (ACUTE/IP ONLY): Excellent PT Patient demonstrates impairments in the following area(s): Balance;Endurance;Motor;Safety PT Transfers Functional Problem(s): Bed Mobility;Bed to Chair;Car;Furniture;Floor PT Locomotion Functional Problem(s): Ambulation;Wheelchair Mobility;Stairs PT Plan PT Intensity: Minimum of 1-2 x/day ,45 to 90 minutes PT Frequency: 5 out of 7 days PT Duration Estimated Length of Stay: 5-7 days PT Treatment/Interventions: Ambulation/gait training;Balance/vestibular training;Cognitive remediation/compensation;Community reintegration;Discharge planning;Disease management/prevention;DME/adaptive equipment instruction;Functional mobility training;Neuromuscular re-education;Patient/family education;Stair training;Therapeutic Activities;Therapeutic Exercise;UE/LE Strength taining/ROM;UE/LE Coordination activities PT Transfers Anticipated Outcome(s): mod I PT Locomotion Anticipated Outcome(s): mod I with LRAD at ambulatory level PT Recommendation Follow Up Recommendations: Outpatient PT Patient destination: Home Equipment Recommended: None recommended by PT Equipment Details: none at this time   PT Evaluation Precautions/Restrictions Precautions Precautions: Fall Restrictions Weight Bearing Restrictions: No Pain Pain Assessment Pain Scale: 0-10 Pain Score: 0-No pain Pain Interference Pain Interference Pain Effect on Sleep: 0. Does not apply - I have not had any pain or hurting in the past 5 days Pain Interference with Therapy Activities: 0. Does  not apply - I have not received rehabilitationtherapy in the past 5 days Pain Interference with Day-to-Day Activities: 1. 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) Vision/Perception  Vision - History Baseline Vision: No visual deficits Patient Visual Report: No change from baseline Perception Perception: Within Functional Limits Praxis Praxis: Intact  Cognition Overall Cognitive Status: Within Functional Limits for tasks assessed Arousal/Alertness: Awake/alert Orientation Level: Oriented X4 Year: 2022 Month: September Day of Week: Correct Attention: Selective Selective Attention: Impaired Memory: Impaired Memory Impairment: Decreased short term memory Awareness: Impaired Awareness Impairment: Anticipatory impairment Problem Solving: Impaired Safety/Judgment: Appears intact Sensation Sensation Light Touch: Appears Intact Proprioception: Appears Intact Coordination Gross Motor Movements are Fluid and Coordinated: No Fine Motor Movements are Fluid and Coordinated: No Coordination and Movement Description: minor ataxia Heel Shin Test: mildly impaired R side Motor  Motor Motor: Hemiplegia;Ataxia;Abnormal postural alignment and control Motor - Skilled Clinical Observations: Mild R hemi  Trunk/Postural Assessment  Cervical Assessment Cervical Assessment: Within Functional Limits Thoracic Assessment Thoracic Assessment: Exceptions to [facility] (kyphotic) Lumbar Assessment Lumbar  Assessment: Within Functional Limits Postural Control Postural Control: Deficits on evaluation Righting Reactions: delayed  Balance Balance Balance Assessed: Yes Standardized Balance Assessment Standardized Balance Assessment: Berg Balance Test;Dynamic Gait Index;Timed Up and Go Test Berg Balance Test Sit to Stand: Able to stand without using hands and stabilize independently Standing Unsupported: Able to stand 2 minutes with supervision Sitting  with Back Unsupported but Feet Supported on Floor or Stool: Able to sit safely and securely 2 minutes Stand to Sit: Sits safely with minimal use of hands Transfers: Able to transfer safely, minor use of hands Standing Unsupported with Eyes Closed: Able to stand 10 seconds with supervision Standing Ubsupported with Feet Together: Able to place feet together independently and stand for 1 minute with supervision From Standing, Reach Forward with Outstretched Arm: Reaches forward but needs supervision From Standing Position, Pick up Object from Floor: Unable to try/needs assist to keep balance From Standing Position, Turn to Look Behind Over each Shoulder: Turn sideways only but maintains balance Turn 360 Degrees: Able to turn 360 degrees safely but slowly Standing Unsupported, Alternately Place Feet on Step/Stool: Able to complete >2 steps/needs minimal assist Standing Unsupported, One Foot in Front: Needs help to step but can hold 15 seconds Standing on One Leg: Tries to lift leg/unable to hold 3 seconds but remains standing independently Total Score: 33 Dynamic Gait Index Level Surface: Mild Impairment Change in Gait Speed: Mild Impairment Gait with Horizontal Head Turns: Mild Impairment Gait with Vertical Head Turns: Mild Impairment Gait and Pivot Turn: Mild Impairment Step Over Obstacle: Moderate Impairment Step Around Obstacles: Mild Impairment Steps: Mild Impairment Total Score: 15 Timed Up and Go Test TUG: Normal TUG Normal TUG  (seconds): 9.4 Static Sitting Balance Static Sitting - Balance Support: No upper extremity supported;Feet supported Static Sitting - Level of Assistance: 6: Modified independent (Device/Increase time) Dynamic Sitting Balance Dynamic Sitting - Balance Support: No upper extremity supported;Feet supported;During functional activity Dynamic Sitting - Level of Assistance: 5: Stand by assistance Static Standing Balance Static Standing - Balance Support: Bilateral upper extremity supported;During functional activity Static Standing - Level of Assistance: 5: Stand by assistance Dynamic Standing Balance Dynamic Standing - Balance Support: No upper extremity supported;During functional activity Dynamic Standing - Level of Assistance: 4: Min assist Extremity Assessment   RLE Assessment RLE Assessment: Within Functional Limits General Strength Comments: see below RLE Strength Right Hip Flexion: 4+/5 Right Knee Flexion: 5/5 Right Knee Extension: 5/5 Right Ankle Dorsiflexion: 5/5 LLE Assessment LLE Assessment: Within Functional Limits General Strength Comments: see below LLE Strength Left Hip Flexion: 5/5 Left Knee Flexion: 5/5 Left Knee Extension: 4+/5 Left Ankle Dorsiflexion: 5/5  Care Tool Care Tool Bed Mobility Roll left and right activity   Roll left and right assist level: Independent    Sit to lying activity   Sit to lying assist level: Supervision/Verbal cueing    Lying to sitting on side of bed activity   Lying to sitting on side of bed assist level: the ability to move from lying on the back to sitting on the side of the bed with no back support.: Supervision/Verbal cueing     Care Tool Transfers Sit to stand transfer   Sit to stand assist level: Contact Guard/Touching assist    Chair/bed transfer   Chair/bed transfer assist level: Contact Guard/Touching assist     Toilet transfer        Car transfer   Car transfer assist level: Contact Guard/Touching assist       Care Tool Locomotion Ambulation   Assist level: Minimal Assistance - Patient > 75% Assistive device: No Device Max distance: 200'  Walk 10 feet activity   Assist level: Minimal Assistance - Patient > 75% Assistive device: No Device   Walk 50 feet with 2 turns activity   Assist level: Minimal Assistance - Patient > 75% Assistive device: No  Device  Walk 150 feet activity   Assist level: Minimal Assistance - Patient > 75% Assistive device: No Device  Walk 10 feet on uneven surfaces activity   Assist level: Minimal Assistance - Patient > 75%    Stairs   Assist level: Minimal Assistance - Patient > 75% Stairs assistive device: 2 hand rails Max number of stairs: 12  Walk up/down 1 step activity   Walk up/down 1 step (curb) assist level: Minimal Assistance - Patient > 75% Walk up/down 1 step or curb assistive device: 2 hand rails    Walk up/down 4 steps activity Walk up/down 4 steps assist level: Minimal Assistance - Patient > 75% Walk up/down 4 steps assistive device: 2 hand rails  Walk up/down 12 steps activity   Walk up/down 12 steps assist level: Minimal Assistance - Patient > 75% Walk up/down 12 steps assistive device: 2 hand rails  Pick up small objects from floor   Pick up small object from the floor assist level: Moderate Assistance - Patient 50 - 74%    Wheelchair Is the patient using a wheelchair?: No          Wheel 50 feet with 2 turns activity      Wheel 150 feet activity        Refer to Care Plan for Long Term Goals  SHORT TERM GOAL WEEK 1 PT Short Term Goal 1 (Week 1): =LTG due to ELOS  Recommendations for other services: None   Skilled Therapeutic Intervention Evaluation completed (see details above and below) with education on PT POC and goals and individual treatment initiated with focus on functional transfer, gait, and balance assessment. Pt received supine in bed, agreeable to therapy evaluation. No complaints of pain. Rolling L/R independently,  supine to/from sit with Supervision. Sit to stand with CGA and no AD throughout session. Ambulation up to 200 ft with min A initially progressing to CGA due to mild ataxia and path deviation, no AD needed. Ascend/descend 12 x 6" stairs with 2 handrails and min A needed overall. Car transfer with CGA with cues for safe transfer technique. Ambulation up/down ramp and across uneven surface with min A and no AD. Patient demonstrates increased fall risk as noted by score of  33/56 on Berg Balance Scale.  (<36= high risk for falls, close to 100%; 37-45 significant >80%; 46-51 moderate >50%; 52-55 lower >25%). Pt scores 9.4 sec on TUG, and scores 15/24 on DGI. Pt placed overall in "high" fall risk category based on scores. Reviewed balance test scores and functional implications. Pt left seated in recliner in room with needs in reach, chair alarm in place at end of session.  Mobility Bed Mobility Bed Mobility: Rolling Right;Rolling Left;Supine to Sit;Sit to Supine Rolling Right: Independent Rolling Left: Independent Supine to Sit: Supervision/Verbal cueing Sit to Supine: Supervision/Verbal cueing Transfers Transfers: Sit to Stand;Stand Pivot Transfers Sit to Stand: Contact Guard/Touching assist Stand Pivot Transfers: Contact Guard/Touching assist Transfer (Assistive device): None Locomotion  Gait Gait Distance (Feet): 200 Feet Assistive device: None Gait Gait Pattern: Impaired (slightly ataxic) Gait velocity: Decreased Stairs / Additional Locomotion Stairs: Yes Stairs Assistance: Minimal Assistance - Patient > 75% Stair Management Technique: Two rails Number of Stairs: 12 Height of Stairs: 6 Ramp: Minimal Assistance - Patient >75% Curb: Minimal Assistance - Patient >75% Wheelchair Mobility Wheelchair Mobility: No   Discharge Criteria: Patient will be discharged from PT if patient refuses treatment 3 consecutive times without medical reason, if treatment goals not met, if there is  a change  in medical status, if patient makes no progress towards goals or if patient is discharged from hospital.  The above assessment, treatment plan, treatment alternatives and goals were discussed and mutually agreed upon: by patient   Excell Seltzer, PT, DPT, CSRS 06/02/2021, 12:29 PM

## 2021-06-02 NOTE — Progress Notes (Signed)
Inpatient Rehabilitation  Patient information reviewed and entered into eRehab system by Dalen Hennessee M. Vincent Ehrler, M.A., CCC/SLP, PPS Coordinator.  Information including medical coding, functional ability and quality indicators will be reviewed and updated through discharge.    

## 2021-06-02 NOTE — Plan of Care (Signed)
  Problem: RH Balance Goal: LTG Patient will maintain dynamic standing balance (PT) Description: LTG:  Patient will maintain dynamic standing balance with assistance during mobility activities (PT) Flowsheets (Taken 06/02/2021 1242) LTG: Pt will maintain dynamic standing balance during mobility activities with:: Independent with assistive device    Problem: Sit to Stand Goal: LTG:  Patient will perform sit to stand with assistance level (PT) Description: LTG:  Patient will perform sit to stand with assistance level (PT) Flowsheets (Taken 06/02/2021 1242) LTG: PT will perform sit to stand in preparation for functional mobility with assistance level: Independent with assistive device   Problem: RH Bed Mobility Goal: LTG Patient will perform bed mobility with assist (PT) Description: LTG: Patient will perform bed mobility with assistance, with/without cues (PT). Flowsheets (Taken 06/02/2021 1242) LTG: Pt will perform bed mobility with assistance level of: Independent   Problem: RH Bed to Chair Transfers Goal: LTG Patient will perform bed/chair transfers w/assist (PT) Description: LTG: Patient will perform bed to chair transfers with assistance (PT). Flowsheets (Taken 06/02/2021 1242) LTG: Pt will perform Bed to Chair Transfers with assistance level: Independent with assistive device    Problem: RH Car Transfers Goal: LTG Patient will perform car transfers with assist (PT) Description: LTG: Patient will perform car transfers with assistance (PT). Flowsheets (Taken 06/02/2021 1242) LTG: Pt will perform car transfers with assist:: Independent with assistive device    Problem: RH Ambulation Goal: LTG Patient will ambulate in controlled environment (PT) Description: LTG: Patient will ambulate in a controlled environment, # of feet with assistance (PT). Flowsheets (Taken 06/02/2021 1242) LTG: Pt will ambulate in controlled environ  assist needed:: Independent with assistive device LTG: Ambulation  distance in controlled environment: 150 ft Goal: LTG Patient will ambulate in home environment (PT) Description: LTG: Patient will ambulate in home environment, # of feet with assistance (PT). Flowsheets (Taken 06/02/2021 1242) LTG: Pt will ambulate in home environ  assist needed:: Independent with assistive device LTG: Ambulation distance in home environment: 75 ft   Problem: RH Stairs Goal: LTG Patient will ambulate up and down stairs w/assist (PT) Description: LTG: Patient will ambulate up and down # of stairs with assistance (PT) Flowsheets (Taken 06/02/2021 1242) LTG: Pt will ambulate up/down stairs assist needed:: Supervision/Verbal cueing LTG: Pt will  ambulate up and down number of stairs: 5 stairs with no AD

## 2021-06-02 NOTE — Discharge Instructions (Addendum)
Inpatient Rehab Discharge Instructions  Deborah Carey [facility] Discharge date and time:  06/07/21  Activities/Precautions/ Functional Status: Activity: no lifting, driving, or strenuous exercise till cleared by MD Diet: cardiac diet and diabetic diet Wound Care: none needed  Functional status:  ___ No restrictions     ___ Walk up steps independently ___ 24/7 supervision/assistance   ___ Walk up steps with assistance _X__ Intermittent supervision/assistance  ___ Bathe/dress independently ___ Walk with walker     ___ Bathe/dress with assistance ___ Walk Independently    ___ Shower independently ___ Walk with assistance    ___ Shower with assistance _X__ No alcohol     ___ Return to work/school ________   COMMUNITY REFERRALS UPON DISCHARGE:     Outpatient: PT     OT    ST                 Agency: Outpatient Rehab   Phone:[phone]              Appointment Date/Time:*Please expect follow-up within 7-10 business days to schedule appointment. If you have not received follow-up, be sure to contact the site directly.*  Medical Equipment/Items Ordered: has RW already                                                 Agency/Supplier: N/A     Special Instructions:  STROKE/TIA DISCHARGE INSTRUCTIONS SMOKING Cigarette smoking nearly doubles your risk of having a stroke & is the single most alterable risk factor  If you smoke or have smoked in the last 12 months, you are advised to quit smoking for your health. Most of the excess cardiovascular risk related to smoking disappears within a year of stopping. Ask you doctor about anti-smoking medications Home Quit Line: 1-800-QUIT NOW Free Smoking Cessation Classes [phone]  CHOLESTEROL Know your levels; limit fat & cholesterol in your diet  Lipid Panel     Component Value Date/Time   CHOL 171 05/26/2021 0429   TRIG 64 05/26/2021 0429   TRIG 66 05/26/2021 0429   HDL 49 05/26/2021 0429   CHOLHDL 3.5 05/26/2021 0429   VLDL 13  05/26/2021 0429   LDLCALC 109 (H) 05/26/2021 0429     Many patients benefit from treatment even if their cholesterol is at goal. Goal: Total Cholesterol (CHOL) less than 160 Goal:  Triglycerides (TRIG) less than 150 Goal:  HDL greater than 40 Goal:  LDL (LDLCALC) less than 100   BLOOD PRESSURE American Stroke Association blood pressure target is less that 120/80 mm/Hg  Your discharge blood pressure is:  BP: 112/68 Monitor your blood pressure Limit your salt and alcohol intake Many individuals will require more than one medication for high blood pressure  DIABETES (A1c is a blood sugar average for last 3 months) Goal HGBA1c is under 7% (HBGA1c is blood sugar average for last 3 months)  Diabetes: Pre-diabetes    Lab Results  Component Value Date   HGBA1C 5.8 (H) 05/26/2021    Your HGBA1c can be lowered with medications, healthy diet, and exercise. Check your blood sugar as directed by your physician Call your physician if you experience unexplained or low blood sugars.  PHYSICAL ACTIVITY/REHABILITATION Goal is 30 minutes at least 4 days per week  Activity: No driving, Therapies: see above  Return to work: N/a Activity decreases your risk of heart  attack and stroke and makes your heart stronger.  It helps control your weight and blood pressure; helps you relax and can improve your mood. Participate in a regular exercise program. Talk with your doctor about the best form of exercise for you (dancing, walking, swimming, cycling).  DIET/WEIGHT Goal is to maintain a healthy weight  Your discharge diet is:  Diet Order             Diet heart healthy/carb modified Room service appropriate? Yes; Fluid consistency: Thin  Diet effective now                   liquids Your height is:  Height: 5\' 4"  (162.6 cm) Your current weight is: Weight: 94.2 kg Your Body Mass Index (BMI) is:  BMI (Calculated): 35.63 Following the type of diet specifically designed for you will help prevent another  stroke. Your goal weight is:  145 lbs Your goal Body Mass Index (BMI) is 19-24. Healthy food habits can help reduce 3 risk factors for stroke:  High cholesterol, hypertension, and excess weight.  RESOURCES Stroke/Support Group:  Call [phone]   STROKE EDUCATION PROVIDED/REVIEWED AND GIVEN TO PATIENT Stroke warning signs and symptoms How to activate emergency medical system (call 911). Medications prescribed at discharge. Need for follow-up after discharge. Personal risk factors for stroke. Pneumonia vaccine given:  Flu vaccine given:  My questions have been answered, the writing is legible, and I understand these instructions.  I will adhere to these goals & educational materials that have been provided to me after my discharge from the hospital.      My questions have been answered and I understand these instructions. I will adhere to these goals and the provided educational materials after my discharge from the hospital.  Patient/Caregiver Signature _______________________________ Date __________  Clinician Signature _______________________________________ Date __________  Please bring this form and your medication list with you to all your follow-up doctor's appointments.

## 2021-06-02 NOTE — Plan of Care (Signed)
  Problem: RH Swallowing Goal: LTG Patient will consume least restrictive diet using compensatory strategies with assistance (SLP) Description: LTG:  Patient will consume least restrictive diet using compensatory strategies with assistance (SLP) Flowsheets (Taken 06/02/2021 2050) LTG: Pt Patient will consume least restrictive diet using compensatory strategies with assistance of (SLP): Modified Independent Goal: LTG Patient will participate in dysphagia therapy to increase swallow function with assistance (SLP) Description: LTG:  Patient will participate in dysphagia therapy to increase swallow function with assistance (SLP) Flowsheets (Taken 06/02/2021 2050) LTG: Pt will participate in dysphagia therapy to increase swallow function with assistance of (SLP): Modified Independent Goal: LTG Pt will demonstrate functional change in swallow as evidenced by bedside/clinical objective assessment (SLP) Description: LTG: Patient will demonstrate functional change in swallow as evidenced by bedside/clinical objective assessment (SLP) Flowsheets (Taken 06/02/2021 2050) LTG: Patient will demonstrate functional change in swallow as evidenced by bedside/clinical objective assessment: Oropharyngeal swallow   Problem: RH Expression Communication Goal: LTG Patient will verbally express basic/complex needs(SLP) Description: LTG:  Patient will verbally express basic/complex needs, wants or ideas with cues  (SLP) Flowsheets (Taken 06/02/2021 2050) LTG: Patient will verbally express basic/complex needs, wants or ideas (SLP): Modified Independent Goal: LTG Patient will increase word finding of common (SLP) Description: LTG:  Patient will increase word finding of common objects/daily info/abstract thoughts with cues using compensatory strategies (SLP). Flowsheets (Taken 06/02/2021 2050) LTG: Patient will increase word finding of common (SLP): Modified Independent

## 2021-06-02 NOTE — Evaluation (Signed)
Speech Language Pathology Assessment and Plan  Patient Details  Name: Deborah Carey MRN: [id number] Date of Birth: 04/23/1951  SLP Diagnosis: Cognitive Impairments;Other (comment);Aphasia (cognition to be further assessed)  Rehab Potential: Excellent ELOS: 5-7 days   Today's Date: 06/02/2021 SLP Individual Time: 9407-6808 SLP Individual Time Calculation (min): 60 min  Hospital Problem: Principal Problem:   Acute ischemic left middle cerebral artery (MCA) stroke (HCC)  Past Medical History:  Past Medical History:  Diagnosis Date   Allergy    allergic rhinitis   Anxiety    Cataract    removed both eyes    Infertility, female    Osteoarthritis of both knees    OA    PONV (postoperative nausea and vomiting)    Post-menopausal bleeding    neg endo bx.   Past Surgical History:  Past Surgical History:  Procedure Laterality Date   cataract surgery  2011   CESAREAN SECTION     times 2   COLONOSCOPY  2010   hems    ENDOMETRIAL BIOPSY     neg for CA cells   FACIAL COSMETIC SURGERY  2006   face lift   FOOT SURGERY  1998   rt heel spur removal   IR CT HEAD LTD  05/25/2021   IR PERCUTANEOUS ART THROMBECTOMY/INFUSION INTRACRANIAL INC DIAG ANGIO  05/25/2021   IR US GUIDE VASC ACCESS RIGHT  05/25/2021   RADIOLOGY WITH ANESTHESIA N/A 05/25/2021   Procedure: IR WITH ANESTHESIA;  Surgeon: Aletta Edouard, MD;  Location: [place];  Service: Radiology;  Laterality: N/A;   REFRACTIVE SURGERY  08/1999    Assessment / Plan / Recommendation Clinical Impression HPI: Deborah Carey. [name] is a 70 year old female with history of OA, anxiety who was admitted to [facility] 05/25/21 with acute on set of right sided weakness, left ward gaze and inability to speak. CTA head showed occlusion of L-M1 MCA with large L-MCA ischemia as well as incidental 1.1 cm soft tissue nodule medial right apex --CT chest recommended in 3 months.  She developed acute respiratory failure requiring intubation prior to transfer to [facility] for  intervention. MRI brain showed Left MCA infarct prominent in L-putamen and caudate head. MBS done revealing delay in swallow with mild to moderate pharyngeal dysphagia--->on D3, nectar liquids. She is showing improvement in verbal output but continues to be limited by STM deficits, RLE weakness, cognitive impairments with  BLE>BUE weakness as well as dysphagia with noncompliance with thickened liquids.   Pt presents with a mild expressive aphasia characterized by occasional hesitation and higher level word finding difficulty at conversational level impacting verbal fluency. Auditory verbal comprehension, reading, and writing appear grossly intact. Pt did benefit from additional processing time with complex commands and consistently executed accurately. Pt scored WFL on Western Aphasia Battery Bedside (WAB-B) in auditory verbal comprehension of yes/no questions, following sequential commands, verbal repetition of word and phrase level, and object naming. Pt was oriented x4, displayed adequate selective attention for duration of assessment, and demonstrated appropriate judgement. Further cognitive assessment is recommended. Patient currently consuming Dys 3 textures with adequate mastication, slightly prolonged oral transit, and adequate oral clearance without overt s/sx of aspiration. Consumed nectar thick liquid sips with timely AP transit and no overt s/sx of aspiration. Recommend continuation of current diet at this time. Pt appears appropriate for repeat MBS as scheduling allows. Most recent MBS completed on 9/22 revealing a mild oral and mild-to-moderate pharyngeal dysphagia with aspiration of thin with both cup and straw. Pt  would benefit from skilled SLP intervention to maximize swallow and communication function and further assess cognition prior to discharge.    Skilled Therapeutic Interventions          Pt participated in Western Aphasia Battery Bedside (WAB-B), clinical BSE, as well as further  non-standardized assessments of cognitive-linguistic, speech, and language function. Please see above.     SLP Assessment  Patient will need skilled Speech Lanaguage Pathology Services during CIR admission    Recommendations  SLP Diet Recommendations: Dysphagia 3 (Mech soft);Nectar Liquid Administration via: Cup Medication Administration: Whole meds with puree Supervision: Patient able to self feed Compensations: Small sips/bites;Slow rate;Minimize environmental distractions Postural Changes and/or Swallow Maneuvers: Seated upright 90 degrees Oral Care Recommendations: Oral care BID Patient destination: Home Follow up Recommendations: Outpatient SLP Equipment Recommended: None recommended by SLP    SLP Frequency 3 to 5 out of 7 days   SLP Duration  SLP Intensity  SLP Treatment/Interventions 5-7 days  Minumum of 1-2 x/day, 30 to 90 minutes  Speech/Language facilitation;Other (comment);Dysphagia/aspiration precaution training;Functional tasks;Patient/family education;Cognitive remediation/compensation (further assessment)    Pain  No pain  Prior Functioning Cognitive/Linguistic Baseline: Within functional limits Type of Home: House  Lives With: Spouse Available Help at Discharge: Family;Available 24 hours/day Vocation: Retired  SLP Evaluation Cognition Overall Cognitive Status:  (Further assessment recommended) Arousal/Alertness: Awake/alert Orientation Level: Oriented X4 Year: 2022 Month: September Day of Week: Correct Selective Attention: Impaired Selective Attention Impairment: Functional complex;Verbal complex Awareness: Impaired Awareness Impairment: Anticipatory impairment Safety/Judgment: Appears intact  Comprehension Auditory Comprehension Overall Auditory Comprehension: Appears within functional limits for tasks assessed Yes/No Questions: Within Functional Limits Complex Questions: 75-100% accurate Commands: Within Functional Limits Conversation:  Simple EffectiveTechniques: Extra processing time Visual Recognition/Discrimination Discrimination: Not tested Reading Comprehension Reading Status: Within funtional limits Expression Expression Primary Mode of Expression: Verbal Verbal Expression Overall Verbal Expression: Impaired Initiation: No impairment Automatic Speech: Name;Social Response;Counting;Day of week;Month of year Level of Generative/Spontaneous Verbalization: Sentence Repetition: No impairment Naming: Impairment Responsive: 76-100% accurate Confrontation: Impaired Convergent: 75-100% accurate Divergent: Not tested Verbal Errors: Other (comment) (hesitancy) Pragmatics: No impairment Effective Techniques: Semantic cues Written Expression Dominant Hand: Right Written Expression: Within Functional Limits Oral Motor Oral Motor/Sensory Function Overall Oral Motor/Sensory Function: Mild impairment Facial ROM: Reduced right Facial Symmetry: Abnormal symmetry right Facial Strength: Reduced right Lingual ROM: Within Functional Limits Lingual Symmetry: Within Functional Limits Lingual Strength: Reduced Mandible: Within Functional Limits Motor Speech Overall Motor Speech: Appears within functional limits for tasks assessed Respiration: Within functional limits Phonation: Normal Resonance: Within functional limits Articulation: Within functional limitis Intelligibility: Intelligible Motor Planning: Witnin functional limits Motor Speech Errors: Not applicable  Care Tool Care Tool Cognition Ability to hear (with hearing aid or hearing appliances if normally used Ability to hear (with hearing aid or hearing appliances if normally used): 0. Adequate - no difficulty in normal conservation, social interaction, listening to TV   Expression of Ideas and Wants Expression of Ideas and Wants: 3. Some difficulty - exhibits some difficulty with expressing needs and ideas (e.g, some words or finishing thoughts) or speech is  not clear   Understanding Verbal and Non-Verbal Content Understanding Verbal and Non-Verbal Content: 3. Usually understands - understands most conversations, but misses some part/intent of message. Requires cues at times to understand  Memory/Recall Ability Memory/Recall Ability : Current season;That he or she is in a hospital/hospital unit   PMSV Assessment  PMSV Trial Intelligibility: Intelligible  Bedside Swallowing Assessment General Date of Onset: 05/25/21 Previous Swallow Assessment: 9/22   Diet Prior to this Study: Dysphagia 3 (soft);Thin liquids Temperature Spikes Noted: No Respiratory Status: Room air History of Recent Intubation: Yes Length of Intubations (days): 1 days Date extubated: 05/25/21 Behavior/Cognition: Alert;Cooperative;Pleasant mood Oral Cavity - Dentition: Adequate natural dentition Self-Feeding Abilities: Able to feed self Vision: Functional for self-feeding Patient Positioning: Upright in chair/Tumbleform Baseline Vocal Quality: Normal Volitional Cough: Strong Volitional Swallow: Able to elicit  Oral Care Assessment Does patient have any of the following "high(er) risk" factors?: None of the above Does patient have any of the following "at risk" factors?: Other - dysphagia Patient is AT RISK: Order set for Adult Oral Care Protocol initiated -  "At Risk Patients" option selected (see row information) Ice Chips Ice chips: Not tested Thin Liquid Thin Liquid: Not tested Nectar Thick Nectar Thick Liquid: Within functional limits Presentation: Cup Puree Puree: Within functional limits Presentation: Spoon Solid Solid: Impaired Oral Phase Functional Implications: Prolonged oral transit BSE Assessment Risk for Aspiration Impact on safety and function: Mild aspiration risk;Moderate aspiration risk  Short Term Goals: Week 1: SLP Short Term Goal 1 (Week 1): STG=LTG due to ELOS  Refer to Care Plan for Long Term Goals  Recommendations for other services:  None   Discharge Criteria: Patient will be discharged from SLP if patient refuses treatment 3 consecutive times without medical reason, if treatment goals not met, if there is a change in medical status, if patient makes no progress towards goals or if patient is discharged from hospital.  The above assessment, treatment plan, treatment alternatives and goals were discussed and mutually agreed upon: by patient  Patty Sermons 06/02/2021, 8:46 PM

## 2021-06-02 NOTE — Progress Notes (Signed)
PROGRESS NOTE   Subjective/Complaints:  Pt reports feeling really good- wants ot leave sooner than later.   Slept OK.  No issues.   ROS:  Pt denies SOB, abd pain, CP, N/V/C/D, and vision changes   Objective:   No results found. Recent Labs    05/31/21 0724 06/02/21 0524  WBC 9.3 10.3  HGB 12.6 13.3  HCT 35.9* 37.7  PLT 210 219   Recent Labs    05/31/21 0724 06/02/21 0524  NA 139 136  K 3.7 4.2  CL 106 103  CO2 27 27  GLUCOSE 106* 111*  BUN 12 17  CREATININE 0.77 0.84  CALCIUM 9.3 9.0    Intake/Output Summary (Last 24 hours) at 06/02/2021 4650 Last data filed at 06/01/2021 1845 Gross per 24 hour  Intake 300 ml  Output --  Net 300 ml        Physical Exam: Vital Signs Blood pressure 129/62, pulse 75, temperature 98 F (36.7 C), temperature source Oral, resp. rate 18, height 5\' 4"  (1.626 m), weight 94.2 kg, last menstrual period 09/05/2008, SpO2 100 %.    General: awake, alert, appropriate,  in shower- with OT at side; NAD HENT: conjugate gaze; oropharynx moist CV: regular rate; no JVD Pulmonary: CTA B/L; no W/R/R- good air movement GI: soft, NT, ND, (+)BS Psychiatric: appropriate Neurological: Ox3  Musculoskeletal:     Cervical back: Normal range of motion and neck supple.     Comments: RUE- biceps, triceps, WE, grip and finger abd 4+/5- slightly better this AM LUE- 5/5 RLE HF, KE, KF, DF and PF 4+/5 LLE- 5/5   Skin:    General: Skin is warm and dry.     Comments: Big bruise; RUE upper arm; and L hand  Neurological:  Slightly slowed processing-    Assessment/Plan: 1. Functional deficits which require 3+ hours per day of interdisciplinary therapy in a comprehensive inpatient rehab setting. Physiatrist is providing close team supervision and 24 hour management of active medical problems listed below. Physiatrist and rehab team continue to assess barriers to discharge/monitor patient  progress toward functional and medical goals  Care Tool:  Bathing    Body parts bathed by patient: Right arm, Left arm, Chest, Abdomen, Front perineal area, Buttocks, Right upper leg, Left upper leg, Right lower leg, Left lower leg, Face         Bathing assist Assist Level: Contact Guard/Touching assist     Upper Body Dressing/Undressing Upper body dressing   What is the patient wearing?: Pull over shirt    Upper body assist Assist Level: Set up assist    Lower Body Dressing/Undressing Lower body dressing      What is the patient wearing?: Underwear/pull up, Pants     Lower body assist Assist for lower body dressing: Contact Guard/Touching assist     Toileting Toileting    Toileting assist Assist for toileting: Contact Guard/Touching assist     Transfers Chair/bed transfer  Transfers assist     Chair/bed transfer assist level: Contact Guard/Touching assist     Locomotion Ambulation   Ambulation assist              Walk 10 feet  activity   Assist           Walk 50 feet activity   Assist           Walk 150 feet activity   Assist           Walk 10 feet on uneven surface  activity   Assist           Wheelchair     Assist               Wheelchair 50 feet with 2 turns activity    Assist            Wheelchair 150 feet activity     Assist          Blood pressure 129/62, pulse 75, temperature 98 F (36.7 C), temperature source Oral, resp. rate 18, height 5\' 4"  (1.626 m), weight 94.2 kg, last menstrual period 09/05/2008, SpO2 100 %.  Medical Problem List and Plan: 1.  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 Con't PT and OT and SLP for swallowing.  2.  Antithrombotics: -DVT/anticoagulation:  Pharmaceutical:  Other (comment)--Eliquis.              -antiplatelet therapy: N/A 3. Pain Management: Tylenol prn.  4. Mood: LCSW to follow for evaluation and support.              -antipsychotic agents: N/A 5. Neuropsych: This patient is capable of making decisions on her own behalf. 6. Skin/Wound Care: Routine pressure relief measures.  7. Fluids/Electrolytes/Nutrition: Monitor I/O. Check lytes in am. 8. A fib: Monitor HR TID--continue metoprolol and Eliquis.  9/28- rate controlled- con't regimen 9.  Dyslipidemia: Now on Lipitor.  10. Dysphagia: Continue DIII w/nectar liquids and supervision for safety.  11. Impaired fasting sugars: Hgb A1C-5.8. Add carb modifications to diet.    9/28- BG's controlled- con't regimen        LOS: 1 days A FACE TO FACE EVALUATION WAS PERFORMED  Deborah Carey 06/02/2021, 8:33 AM

## 2021-06-03 DIAGNOSIS — I63512 Cerebral infarction due to unspecified occlusion or stenosis of left middle cerebral artery: Secondary | ICD-10-CM | POA: Diagnosis not present

## 2021-06-03 MED ORDER — TRAZODONE HCL 50 MG PO TABS: 50.0000 mg | ORAL_TABLET | Freq: Every evening | ORAL | Status: DC | PRN

## 2021-06-03 NOTE — Progress Notes (Signed)
Speech Language Pathology Daily Session Note  Patient Details  Name: Deborah Carey MRN: [id number] Date of Birth: [birth year]  Today's Date: 06/03/2021 SLP Individual Time: 1315-1400 SLP Individual Time Calculation (min): 45 min  Short Term Goals: Week 1: SLP Short Term Goal 1 (Week 1): STG=LTG due to ELOS  Skilled Therapeutic Interventions:   Patient seen for skilled ST session focusing on language and swallow function goals. Patient consumed several cup sips of thin liquids (water) and exhibited only mild intensity of throat clearing. SLP explained water protocol to patient however she seemed apprehensive when SLP discussing aspiration and she wished to wait until MBS completed next date to try water. 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 Majority of her points missed were in short term recall of short story and recalling 5 words after approximately 2.5 minute delay. Patient left in bed with all needs in place. She will benefit from skilled SLP intervention to objectively assess her swallow function and maximize her cognitive-linguistic function prior to discharge.  Pain Pain Assessment Pain Scale: 0-10 Pain Score: 0-No pain  Therapy/Group: Individual Therapy  Sonia Baller, MA, CCC-SLP Speech Therapy

## 2021-06-03 NOTE — Progress Notes (Signed)
Occupational Therapy Session Note  Patient Details  Name: Deborah Carey MRN: [id number] Date of Birth: 10/24/1950  Today's Date: 06/03/2021 OT Individual Time: 8127-5170 OT Individual Time Calculation (min): 30 min    Short Term Goals: Week 1:  OT Short Term Goal 1 (Week 1): STG= LTG 2/2 ELOS  Skilled Therapeutic Interventions/Progress Updates:    OT entered room with pt reporting need to go to the bathroom. Pt ambulated in room with close supervision to get to toilet. Pt voided bladder and completed 3/3 toileting steps with supervision. Pt then ambulated to therapy gym with intermittent CGA for slight LOB. Pt with some right neglect and needed cues to avoid obstacles on R side. Problem solving and fine motor task with peg slide puzzle. Min cues initially to understand concept, then pt able to complete moderately complex puzzle with min A. UB there-ex using 3 lb dowel rod 2 sets of 10 chest press, bicep curl, and straight arm raise. Pt returned to room and left seated in recliner with chair alarm on and needs met.   Therapy Documentation Precautions:  Precautions Precautions: Fall Precaution Comments: right sided weakness Restrictions Weight Bearing Restrictions: No  Pain: Pain Assessment Pain Scale: 0-10 Pain Score: 0-No pain   Therapy/Group: Individual Therapy  Valma Cava 06/03/2021, 10:18 AM

## 2021-06-03 NOTE — Progress Notes (Signed)
Occupational Therapy Session Note  Patient Details  Name: Deborah Carey MRN: [id number] Date of Birth: Oct 23, 1950  Today's Date: 06/03/2021 OT Individual Time: 9144-4584 OT Individual Time Calculation (min): 68 min    Short Term Goals: Week 1:  OT Short Term Goal 1 (Week 1): STG= LTG 2/2 ELOS  Skilled Therapeutic Interventions/Progress Updates:    OT intervention with focus on functional amb without AD, walk-in shower transfers, RUE function, Rt inattention, safety awareness, and activity tolerance. All amb with CGA/close supervision. Walk-in shower transfers with CGA. Pt amb in hallway to locate numbered discs in sequence. Pt able to locate items on Lt and Rt without verbal cues. Slight inattention to Rt envirionment when ambulating. Pt educated on importance of scanning envirionment with mobility. Colored peg complex pattern with supervision. No Rt inattention noted. 9 hole peg test: Rt-26.91" Lt-27.35". Pt reported that she was feeling fatigued at end of session (3rd therapy this morning). Discussed home safety. Pt recalled room number and able to find way back to room. Pt remained in recliner with all needs within reach and seat alarm activated.  Therapy Documentation Precautions:  Precautions Precautions: Fall Precaution Comments: right sided weakness Restrictions Weight Bearing Restrictions: No   Pain:  Pt denies pain this morning   Therapy/Group: Individual Therapy  Leroy Libman 06/03/2021, 11:58 AM

## 2021-06-03 NOTE — Progress Notes (Signed)
Inpatient Rehabilitation Care Coordinator Assessment and Plan Patient Details  Name: Deborah Carey MRN: [id number] Date of Birth: 12-Jan-1951  Today's Date: 06/03/2021  Hospital Problems: Principal Problem:   Acute ischemic left middle cerebral artery (MCA) stroke [facility])  Past Medical History:  Past Medical History:  Diagnosis Date   Allergy    allergic rhinitis   Anxiety    Cataract    removed both eyes    Infertility, female    Osteoarthritis of both knees    OA    PONV (postoperative nausea and vomiting)    Post-menopausal bleeding    neg endo bx.   Past Surgical History:  Past Surgical History:  Procedure Laterality Date   cataract surgery  2011   CESAREAN SECTION     times 2   COLONOSCOPY  2010   hems    ENDOMETRIAL BIOPSY     neg for CA cells   FACIAL COSMETIC SURGERY  2006   face lift   FOOT SURGERY  1998   rt heel spur removal   IR CT HEAD LTD  05/25/2021   IR PERCUTANEOUS ART THROMBECTOMY/INFUSION INTRACRANIAL INC DIAG ANGIO  05/25/2021   IR US GUIDE VASC ACCESS RIGHT  05/25/2021   RADIOLOGY WITH ANESTHESIA N/A 05/25/2021   Procedure: IR WITH ANESTHESIA;  Surgeon: Aletta Edouard, MD;  Location: [place];  Service: Radiology;  Laterality: N/A;   REFRACTIVE SURGERY  08/1999   Social History:  reports that she has never smoked. She has never used smokeless tobacco. She reports that she does not drink alcohol and does not use drugs.  Family / Support Systems Marital Status: Married How Long?: 50+ yerars Patient Roles: Spouse, Parent Spouse/Significant Other: Deborah Carey (husband): [phone] Children: 2 adult hcildren. Dtr [employer] and son lives in [place]. Other Supports: neighbors/relatives PRN when husband has work related activities. Anticipated Caregiver: Husband Deborah Carey Ability/Limitations of Caregiver: Husband can provide mostly 24/7 care unless he has errands to run, and will have a neighbor or friend stay with his wife. Caregiver Availability:  24/7 Family Dynamics: Lives with husband.  Social History Preferred language: English Religion: [employer] Cultural Background: Pt worked as a Higher education careers adviser for 32 years. Education: college Field seismologist - How often do you need to have someone help you when you read instructions, pamphlets, or other written material from your doctor or pharmacy?: Rarely Writes: Yes Employment Status: Retired Date Retired/Disabled/Unemployed: 2002 Age Retired: 50 Public relations account executive Issues: Denies Guardian/Conservator: N/A   Abuse/Neglect Abuse/Neglect Assessment Can Be Completed: Yes Physical Abuse: Denies Verbal Abuse: Denies Sexual Abuse: Denies Exploitation of patient/patient's resources: Denies Self-Neglect: Denies  Patient response to: Social Isolation - How often do you feel lonely or isolated from those around you?: Never  Emotional Status Pt's affect, behavior and adjustment status: Pt in good spirits at time of visit., Recent Psychosocial Issues: Denies Psychiatric History: Denies Substance Abuse History: Denies  Patient / Family Perceptions, Expectations & Goals Pt/Family understanding of illness & functional limitations: Pt and husband have a general understanding of care needs Premorbid pt/family roles/activities: Independent PTA Anticipated changes in roles/activities/participation: Supervision with ADLs/IADLs Pt/family expectations/goals: Pt goal is to work on "occupational needs."  [employer]: None Premorbid Home Care/DME Agencies: None Transportation available at discharge: Husband Is the patient able to respond to transportation needs?: Yes In the past 12 months, has lack of transportation kept you from medical appointments or from getting medications?: No In the past 12 months, has  lack of transportation kept you from meetings, work, or from getting things needed for daily living?: No Resource referrals recommended:  Neuropsychology  Discharge Planning Living Arrangements: Spouse/significant other Support Systems: Spouse/significant other, Friends/neighbors, Other relatives Type of Residence: Private residence Insurance Resources: Multimedia programmer (specify) ([employer] Medicare and RX same) Financial Resources: Social Security Financial Screen Referred: No Living Expenses: Own Money Management: Spouse Does the patient have any problems obtaining your medications?: No Home Management: Both manage home care needs. Patient/Family Preliminary Plans: Husband to assume role Care Coordinator Barriers to Discharge: Decreased caregiver support, Lack of/limited family support Care Coordinator Anticipated Follow Up Needs: HH/OP Expected length of stay: 5-7 days  Clinical Impression SW met with pt in room while in recliner to introduce self, explain role, and discuss discharge process. Pt husband on the phone while SW conducted assessment. Pt is not a English as a second language teacher. HCPOA-husband. DME: 3in1 BSC, RW.  Deborah Carey A Micco Bourbeau 06/03/2021, 7:50 PM

## 2021-06-03 NOTE — Progress Notes (Signed)
Patient ID: Deborah Carey, female   DOB: 01-Oct-1950, 70 y.o.   MRN: [id number]  Per medical team, pt has made gains in rehab and can d/c on 10/3. SW to discuss with patient.   SW met with pt in room to provide updates, and called her husband [name] [phone]) to discuss above. Both happy about gains made in rehab. Fam edu scheduled for 9/30 9am-11am with her husband. Outpatient PT/OT/SLP referral will be sent to [place], MSW, [place] Office: [phone] Cell: [phone] Fax: [phone]

## 2021-06-03 NOTE — Progress Notes (Signed)
PROGRESS NOTE   Subjective/Complaints:  Pt would rather leave earlier than stay until on thin liquids.  Therapy going well- walking SBA with gait belt- no RW.  Poor sleep- hard staying asleep- hasn't taken trazodone prn.   Spoke with Rosita- PT- and they are thinking d/c Monday.   ROS:  Pt denies SOB, abd pain, CP, N/V/C/D, and vision changes   Objective:   No results found. Recent Labs    06/02/21 0524  WBC 10.3  HGB 13.3  HCT 37.7  PLT 219   Recent Labs    06/02/21 0524  NA 136  K 4.2  CL 103  CO2 27  GLUCOSE 111*  BUN 17  CREATININE 0.84  CALCIUM 9.0    Intake/Output Summary (Last 24 hours) at 06/03/2021 0928 Last data filed at 06/03/2021 0738 Gross per 24 hour  Intake 610 ml  Output --  Net 610 ml        Physical Exam: Vital Signs Blood pressure 125/83, pulse 72, temperature 98.5 F (36.9 C), resp. rate 14, height 5\' 4"  (1.626 m), weight 94.2 kg, last menstrual period 09/05/2008, SpO2 100 %.     General: awake, alert, appropriate, sitting up EOB; PT seen as well; NAD HENT: conjugate gaze; oropharynx moist CV: regular rate; no JVD Pulmonary: CTA B/L; no W/R/R- good air movement GI: soft, NT, ND, (+)BS Psychiatric: appropriate Neurological: Ox3  Musculoskeletal:     Cervical back: Normal range of motion and neck supple.     Comments: RUE up to 5-/5 today LUE- 5/5 RLE HF, KE, KF, DF and PF 4+/5- almost 5-/5 today LLE- 5/5   Skin:    General: Skin is warm and dry.     Comments: Big bruise; RUE upper arm; and L hand  Neurological:  Slightly slowed processing-    Assessment/Plan: 1. Functional deficits which require 3+ hours per day of interdisciplinary therapy in a comprehensive inpatient rehab setting. Physiatrist is providing close team supervision and 24 hour management of active medical problems listed below. Physiatrist and rehab team continue to assess barriers to  discharge/monitor patient progress toward functional and medical goals  Care Tool:  Bathing    Body parts bathed by patient: Right arm, Left arm, Chest, Abdomen, Front perineal area, Buttocks, Right upper leg, Left upper leg, Right lower leg, Left lower leg, Face         Bathing assist Assist Level: Supervision/Verbal cueing     Upper Body Dressing/Undressing Upper body dressing   What is the patient wearing?: Pull over shirt    Upper body assist Assist Level: Set up assist    Lower Body Dressing/Undressing Lower body dressing      What is the patient wearing?: Underwear/pull up, Pants     Lower body assist Assist for lower body dressing: Set up assist     Toileting Toileting    Toileting assist Assist for toileting: Contact Guard/Touching assist     Transfers Chair/bed transfer  Transfers assist     Chair/bed transfer assist level: Contact Guard/Touching assist     Locomotion Ambulation   Ambulation assist      Assist level: Minimal Assistance - Patient > 75% Assistive  device: No Device Max distance: 200'   Walk 10 feet activity   Assist     Assist level: Minimal Assistance - Patient > 75% Assistive device: No Device   Walk 50 feet activity   Assist    Assist level: Minimal Assistance - Patient > 75% Assistive device: No Device    Walk 150 feet activity   Assist    Assist level: Minimal Assistance - Patient > 75% Assistive device: No Device    Walk 10 feet on uneven surface  activity   Assist     Assist level: Minimal Assistance - Patient > 75%     Wheelchair     Assist Is the patient using a wheelchair?: No             Wheelchair 50 feet with 2 turns activity    Assist            Wheelchair 150 feet activity     Assist          Blood pressure 125/83, pulse 72, temperature 98.5 F (36.9 C), resp. rate 14, height 5\' 4"  (1.626 m), weight 94.2 kg, last menstrual period 09/05/2008, SpO2 100  %.  Medical Problem List and Plan: 1.  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 Con't PT and OT and SLP for swallowing.   Continue CIR- PT, OT and SLP- d/c Monday 10/3 2.  Antithrombotics: -DVT/anticoagulation:  Pharmaceutical: Other (comment)--Eliquis.              -antiplatelet therapy: N/A 3. Pain Management: Tylenol prn.  9/29- denies pain- con't regimen 4. Mood: LCSW to follow for evaluation and support.              -antipsychotic agents: N/A 5. Neuropsych: This patient is capable of making decisions on her own behalf. 6. Skin/Wound Care: Routine pressure relief measures.  7. Fluids/Electrolytes/Nutrition: Monitor I/O. Check lytes in am. 8. A fib: Monitor HR TID--continue metoprolol and Eliquis.  9/28- rate controlled- con't regimen 9.  Dyslipidemia: Now on Lipitor.  10. Dysphagia: Continue DIII w/nectar liquids and supervision for safety.   9/29- will speak with SLP about MBS again? 11. Impaired fasting sugars: Hgb A1C-5.8. Add carb modifications to diet.    9/28- BG's controlled- con't regimen 12. Mild slowed processing  9/29- doesn't need meds, but con't SLP.  13. Insomnia  9/29- will order trazodone 50 mg QHS prn to take if cannot sleep well.       LOS: 2 days A FACE TO FACE EVALUATION WAS PERFORMED  Krystie Leiter 06/03/2021, 9:28 AM

## 2021-06-03 NOTE — Progress Notes (Signed)
Physical Therapy Session Note  Patient Details  Name: Deborah Carey MRN: [id number] Date of Birth: 12-04-1950  Today's Date: 06/03/2021 PT Individual Time: 0805-0920 PT Individual Time Calculation (min): 75 min   Short Term Goals: Week 1:  PT Short Term Goal 1 (Week 1): =LTG due to ELOS  Skilled Therapeutic Interventions/Progress Updates: Pt presented in bed agreeable to therapy. Pt denies pain at start of session. Performed bed mobility with supervision and pt was able to ambulate in room without AD to obtain clothing. Pt performed reaching tasks to 1st and 2nd dresser drawers to obtain clothing without LOB. PTA set up washbasin and pt was able to wash up in both seating and standing to clean peri-area without unsteadiness and demonstrating overall fair safety. Pt was able to don all clothing including brief and bra in sitting without assist. Pt then ambulated to sink and performed oral hygiene and combed hair in standing with distant supervision. Once completed pt ambulated to rehab gym without AD and supervision. Pt then participated in balance/coordination activities using agility ladder. Participated in ins/outs, and forward/backward zig zags x 4 each. Pt noted to have some difficulty avoiding rungs/straps with RLE more prevalent with fatigue. Pt with mild LOB posteriorly on last zig/zag which pt was able to correct with CGA. After brief rest pt ambulated back to room with PTA providing cues to increase R heel strike as mild shuffling noted. Pt requested to use bathroom prior to end of session. Ambulated to toilet and performed LB clothing management and toilet transfers with distant supervision 9 (urinary void). Pt ambulated to recliner and left with seat alarm on, call bell within reach and current needs met      Therapy Documentation Precautions:  Precautions Precautions: Fall Precaution Comments: right sided weakness Restrictions Weight Bearing Restrictions: No General:   Vital  Signs:   Pain:   Mobility:   Locomotion :    Trunk/Postural Assessment :    Balance:   Exercises:   Other Treatments:      Therapy/Group: Individual Therapy  Tolulope Pinkett 06/03/2021, 1:00 PM

## 2021-06-04 ENCOUNTER — Inpatient Hospital Stay (HOSPITAL_COMMUNITY): Payer: Medicare PPO

## 2021-06-04 DIAGNOSIS — I63512 Cerebral infarction due to unspecified occlusion or stenosis of left middle cerebral artery: Secondary | ICD-10-CM | POA: Diagnosis not present

## 2021-06-04 NOTE — Progress Notes (Signed)
Occupational Therapy Session Note  Patient Details  Name: Deborah Carey MRN: [id number] Date of Birth: [birth year]  Today's Date: 06/04/2021 OT Individual Time: 1015-1100 OT Individual Time Calculation (min): 45 min    Short Term Goals: Week 1:  OT Short Term Goal 1 (Week 1): STG= LTG 2/2 ELOS  Skilled Therapeutic Interventions/Progress Updates:    Pt resting in recliner with husband present for education. Pt practiced walk in shower transfers and bed mobility using 6" step for getting in/out of bed at home. All amb with supervision. Recommended 24 hour supervision at discharge. Discussed pt's slight Rt inattention and home safety. Pt and husband verbalized understanding of all recommendations. Pt returned to room and remained in recliner with seat alarm activated. Hsband present.   Therapy Documentation Precautions:  Precautions Precautions: Fall Precaution Comments: right sided weakness Restrictions Weight Bearing Restrictions: No Pain:  Pt denies pain this morning.   Therapy/Group: Individual Therapy  Leroy Libman 06/04/2021, 3:08 PM

## 2021-06-04 NOTE — IPOC Note (Signed)
Overall Plan of Care [facility]) Patient Details Name: RENEE ERB MRN: [id number] DOB: [birth year]  Admitting Diagnosis: Acute ischemic left middle cerebral artery (MCA) stroke [facility])  Hospital Problems: Principal Problem:   Acute ischemic left middle cerebral artery (MCA) stroke ([place])     Functional Problem List: Nursing Endurance, Medication Management, Perception, Safety, Skin Integrity  PT Balance, Endurance, Motor, Safety  OT Balance, Cognition, Endurance, Motor, Safety  SLP Cognition, Linguistic  TR         Basic ADL's: OT Grooming, Bathing, Dressing, Toileting     Advanced  ADL's: OT Simple Meal Preparation, Laundry, Light Housekeeping     Transfers: PT Bed Mobility, Bed to Chair, Car, [employer], Futures trader, Metallurgist: PT Ambulation, Emergency planning/management officer, Stairs     Additional Impairments: OT    SLP Communication, Social Cognition expression    TR      Anticipated Outcomes Item Anticipated Outcome  Self Feeding I  Swallowing      Basic self-care  I  Toileting  I   Bathroom Transfers I  Bowel/Bladder  n/a  Transfers  mod I  Locomotion  mod I with LRAD at ambulatory level  Communication  mod I  Cognition     Pain  n/a  Safety/Judgment  supervision and no falls   Therapy Plan: PT Intensity: Minimum of 1-2 x/day ,45 to 90 minutes PT Frequency: 5 out of 7 days PT Duration Estimated Length of Stay: 5-7 days OT Intensity: Minimum of 1-2 x/day, 45 to 90 minutes OT Frequency: 5 out of 7 days OT Duration/Estimated Length of Stay: 5-7 days SLP Intensity: Minumum of 1-2 x/day, 30 to 90 minutes SLP Frequency: 3 to 5 out of 7 days SLP Duration/Estimated Length of Stay: 5-7 days   Due to the current state of emergency, patients may not be receiving their 3-hours of Medicare-mandated therapy.   Team Interventions: Nursing Interventions Patient/Family Education, Disease Management/Prevention, Medication Management, Skin Care/Wound  Management, Discharge Planning, Dysphagia/Aspiration Precaution Training  PT interventions Ambulation/gait training, Training and development officer, Cognitive remediation/compensation, Community reintegration, Discharge planning, Disease management/prevention, DME/adaptive equipment instruction, Functional mobility training, Neuromuscular re-education, Patient/family education, Stair training, Therapeutic Activities, Therapeutic Exercise, UE/LE Strength taining/ROM, UE/LE Coordination activities  OT Interventions Training and development officer, Cognitive remediation/compensation, Community reintegration, Discharge planning, DME/adaptive equipment instruction, Functional mobility training, Neuromuscular re-education, Patient/family education, Self Care/advanced ADL retraining, Therapeutic Activities, Therapeutic Exercise, UE/LE Strength taining/ROM, UE/LE Coordination activities  SLP Interventions Speech/Language facilitation, Other (comment), Dysphagia/aspiration precaution training, Functional tasks, Patient/family education, Cognitive remediation/compensation (further assessment)  TR Interventions    SW/CM Interventions Discharge Planning, Psychosocial Support, Patient/Family Education   Barriers to Discharge MD  Medical stability, Home enviroment access/loayout, Lack of/limited family support, Weight, and Behavior  Nursing Decreased caregiver support, Home environment access/layout, Wound Care, Lack of/limited family support, Medication compliance Lives with spouse in 2 level home with 4 steps to enter. No rails available. Able to live on main level with bedroom/bathroom. Spouse able to provide 24/7 assist at discharge.  PT      OT Inaccessible home environment 4 STE without rails.  SLP      SW Decreased caregiver support, Lack of/limited family support     Team Discharge Planning: Destination: PT-Home ,OT- Home , SLP-Home Projected Follow-up: PT-Outpatient PT, OT-  Outpatient OT, SLP-Outpatient  SLP Projected Equipment Needs: PT-None recommended by PT, OT- To be determined, SLP-None recommended by SLP Equipment Details: PT-none at this time, OT-  Patient/family involved in discharge planning: PT- Patient,  OT-Patient, SLP-Patient  MD ELOS: 5-7 days Medical Rehab Prognosis:  Excellent Assessment: Pt is a 70 yr old s/p B/L MCA strokes and ACA stroke- with delayed processing, dysphagia, and R hemiparesis- on D3 nectar thick liquids.   Goals mod I to supervision by d/c 10/3    See Team Conference Notes for weekly updates to the plan of car5-7 days

## 2021-06-04 NOTE — Progress Notes (Signed)
Modified Barium Swallow Progress Note  Patient Details  Name: Deborah Carey MRN: [id number] Date of Birth: 04-19-1951  Today's Date: 06/04/2021  Modified Barium Swallow completed.  Full report located under Chart Review in the Imaging Section.  Brief recommendations include the following:  Clinical Impression  Patient presents with an improved swallow function as compared to previous MBS on 9/22.During oral phase, patient exhibited mild delayed transit with puree solids but more significant delay with regular solids and barium tablet. SLP noted a high palatal arch in patient's oral cavity and during mastication and oral transit of regular solids, bolus was briefly held in this space. Patient had difficulty with bolus cohesion with barium tablet and thin liquids, however was able to eventually swallow with several sips of thin liquids. She exhibited one instance of trace silent aspiration of thin liquids during her first sip of this MBS, but did not exhibit any other instances of aspiration and did not exhibit any penetration with subsequent swallows of thin liquids via cup or straw sips. Aspirate remained just under vocal cords at top of trachea and occured during the swallow. Patient exhibited swallow initiation delay to level of vallecular sinus and heading to pyriform sinus with thin liquids but only trace amounts of vallecular residuals noted with thin, regular solid texture boluses. Chin tuck posture did result in patient initiating swallow at vallecular sinus. During esophageal sweep with barium tablet, tablet transit delayed at level of GE junction (no radiologist present to confirm) which slowly transited through esophagus. SLP is recommending upgrade patient's diet to regular solids, thin liquids at this time.   Swallow Evaluation Recommendations       SLP Diet Recommendations: Regular solids;Thin liquid   Liquid Administration via: Cup;Straw   Medication Administration: Whole meds with  puree   Supervision: Patient able to self feed;Intermittent supervision to cue for compensatory strategies   Compensations: Small sips/bites;Slow rate;Minimize environmental distractions   Postural Changes: Seated upright at 90 degrees   Oral Care Recommendations: Oral care BID;Patient independent with oral care       Sonia Baller, MA, CCC-SLP Speech Therapy

## 2021-06-04 NOTE — Progress Notes (Signed)
Occupational Therapy Session Note  Patient Details  Name: Deborah Carey MRN: [id number] Date of Birth: 04/23/1951  Today's Date: 06/04/2021 OT Individual Time: 1330-1401 OT Individual Time Calculation (min): 31 min    Short Term Goals: Week 1:  OT Short Term Goal 1 (Week 1): STG= LTG 2/2 ELOS   Skilled Therapeutic Interventions/Progress Updates:    Pt received in recliner with no c/o pain, agreeable to OT and requesting to shower.   ADL: Pt completes BADL at overall MOD I/SUPERVISION level. Skilled interventions include: Pt able to gather clothing and soaps needed for shower with MOD I. OTS present for safety - pt  sat to undress LB and completed shower in alternating sitting/standing. Pt required no cuing throughout shower for safety or sequencing. Pt sat on toilet for rest break following shower. Pt attempted to don pants in standing while holding on to grab bar. Received skilled education on safe strategies to dress (e.g. sitting to put on pants). Pt verbalized understanding. Able to accurately request clothing items for dressing. Sat EOB to don shoes/socks, grooming completed at sink level with setup. Pt left at end of session in recliner with exit pad alarm on, call light in reach and all needs met.    Therapy Documentation Precautions:  Precautions Precautions: Fall Precaution Comments: right sided weakness Restrictions Weight Bearing Restrictions: No    Therapy/Group: Individual Therapy  Money Mckeithan 06/04/2021, 2:25 PM

## 2021-06-04 NOTE — Progress Notes (Signed)
Physical Therapy Session Note  Patient Details  Name: Deborah Carey MRN: [id number] Date of Birth: 07-11-1951  Today's Date: 06/04/2021 PT Individual Time: 0805-0845 PT Individual Time Calculation (min): 40 min   Short Term Goals: Week 1:  PT Short Term Goal 1 (Week 1): =LTG due to ELOS  Skilled Therapeutic Interventions/Progress Updates: Pt presented standing at sink unsupervised. Pt in no distress. Once completed task agreeable to therapy. Discussed with pt importance of safety as may still be a fall risk and to try to be aware of safety precautions (per pt bed alarm was off when pt tried to get out of bed). Pt denies pain during session but rest breaks provided as needed and monitored fatigued. Pt ambulated to ortho gym with supervision and without AD. Participated in BITS system visual sequencing while standing on [employer] pad/compliant surface. With cues pt was able to reach L/R crossing midline without LOB. Pt also participated in [place] cancellation test while standing on compliant surface with pt scoring no misses, 1 distraction, and average 3sec reaction time. Pt noted to have poor clearance when stepping off pad with RLE thus once activity completed pt ambulated to rehab gym and participated neuro re-ed via forced use with pt stepping over threshold with RLE forwards and backwards x 10 each. Pt also performed sidestep over stick with RLE x 10. Noted that with sidestep as pt returned to neutral stance pt would intermittently overcorrect and step on LLE. Pt also participated in use of re-bounder with 1Kg weighted ball x 10 forward and x 10 with trunk rotation to L and R. Pt then ambulated back to room holding mild crate with 3.3 lbs added to simulate carrying small load of laundry. Upon return to room pt returned to recliner and left with seat alarm on, call bell within reach and current needs met.      Therapy Documentation Precautions:  Precautions Precautions: Fall Precaution Comments: right  sided weakness Restrictions Weight Bearing Restrictions: No General:   Vital Signs:  Pain: Pain Assessment Pain Scale: 0-10 Pain Score: 0-No pain    Therapy/Group: Individual Therapy  Takeisha Cianci Shaquil Aldana, PTA  06/04/2021, 10:25 AM

## 2021-06-04 NOTE — Progress Notes (Signed)
PROGRESS NOTE   Subjective/Complaints:  Pt reports MBS today-  Walking around room on her own- counseled that shouldn't do this- has to have someone in room with her to prevent falls.    ROS:  Pt denies SOB, abd pain, CP, N/V/C/D, and vision changes    Objective:   No results found. Recent Labs    06/02/21 0524  WBC 10.3  HGB 13.3  HCT 37.7  PLT 219   Recent Labs    06/02/21 0524  NA 136  K 4.2  CL 103  CO2 27  GLUCOSE 111*  BUN 17  CREATININE 0.84  CALCIUM 9.0    Intake/Output Summary (Last 24 hours) at 06/04/2021 0933 Last data filed at 06/03/2021 1818 Gross per 24 hour  Intake 600 ml  Output --  Net 600 ml        Physical Exam: Vital Signs Blood pressure (!) 128/59, pulse 100, temperature 98 F (36.7 C), resp. rate 14, height 5\' 4"  (1.626 m), weight 94.2 kg, last menstrual period 09/05/2008, SpO2 96 %.      General: awake, alert, appropriate, walking around to chair without anyone in room!NAD HENT: conjugate gaze; oropharynx moist CV: regular rate; no JVD Pulmonary: CTA B/L; no W/R/R- good air movement GI: soft, NT, ND, (+)BS Psychiatric: appropriate Neurological: Ox3 Slowed processing, but answers appropriately Musculoskeletal:     Cervical back: Normal range of motion and neck supple.     Comments: RUE up to 5-/5 today LUE- 5/5 RLE HF, KE, KF, DF and PF 4+/5- almost 5-/5 today LLE- 5/5   Skin:    General: Skin is warm and dry.     Comments: Big bruise; RUE upper arm; and L hand  Neurological:  Slightly slowed processing-    Assessment/Plan: 1. Functional deficits which require 3+ hours per day of interdisciplinary therapy in a comprehensive inpatient rehab setting. Physiatrist is providing close team supervision and 24 hour management of active medical problems listed below. Physiatrist and rehab team continue to assess barriers to discharge/monitor patient progress toward  functional and medical goals  Care Tool:  Bathing    Body parts bathed by patient: Right arm, Left arm, Chest, Abdomen, Front perineal area, Buttocks, Right upper leg, Left upper leg, Right lower leg, Left lower leg, Face         Bathing assist Assist Level: Supervision/Verbal cueing     Upper Body Dressing/Undressing Upper body dressing   What is the patient wearing?: Pull over shirt    Upper body assist Assist Level: Set up assist    Lower Body Dressing/Undressing Lower body dressing      What is the patient wearing?: Underwear/pull up, Pants     Lower body assist Assist for lower body dressing: Set up assist     Toileting Toileting    Toileting assist Assist for toileting: Contact Guard/Touching assist     Transfers Chair/bed transfer  Transfers assist     Chair/bed transfer assist level: Contact Guard/Touching assist     Locomotion Ambulation   Ambulation assist      Assist level: Minimal Assistance - Patient > 75% Assistive device: No Device Max distance: 200'   Walk  10 feet activity   Assist     Assist level: Minimal Assistance - Patient > 75% Assistive device: No Device   Walk 50 feet activity   Assist    Assist level: Minimal Assistance - Patient > 75% Assistive device: No Device    Walk 150 feet activity   Assist    Assist level: Minimal Assistance - Patient > 75% Assistive device: No Device    Walk 10 feet on uneven surface  activity   Assist     Assist level: Minimal Assistance - Patient > 75%     Wheelchair     Assist Is the patient using a wheelchair?: No             Wheelchair 50 feet with 2 turns activity    Assist            Wheelchair 150 feet activity     Assist          Blood pressure (!) 128/59, pulse 100, temperature 98 F (36.7 C), resp. rate 14, height 5\' 4"  (1.626 m), weight 94.2 kg, last menstrual period 09/05/2008, SpO2 96 %.  Medical Problem List and Plan: 1.   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    Con't CIR_ PT/OT and SLP- d/c 10/3- MBS today! 2.  Antithrombotics: -DVT/anticoagulation:  Pharmaceutical: Other (comment)--Eliquis.              -antiplatelet therapy: N/A 3. Pain Management: Tylenol prn.  9/29- denies pain- con't regimen 4. Mood: LCSW to follow for evaluation and support.              -antipsychotic agents: N/A 5. Neuropsych: This patient is capable of making decisions on her own behalf. 6. Skin/Wound Care: Routine pressure relief measures.  7. Fluids/Electrolytes/Nutrition: Monitor I/O. Check lytes in am. 8. A fib: Monitor HR TID--continue metoprolol and Eliquis.  9/30- rate controlled- con't regimen 9.  Dyslipidemia: Now on Lipitor.  10. Dysphagia: Continue DIII w/nectar liquids and supervision for safety.   9/29- will speak with SLP about MBS again?  9./30- MBS today- con't regimen 11. Impaired fasting sugars: Hgb A1C-5.8. Add carb modifications to diet.    9/28- BG's controlled- con't regimen 12. Mild slowed processing  9/29- doesn't need meds, but con't SLP.  13. Insomnia  9/29- will order trazodone 50 mg QHS prn to take if cannot sleep well.       LOS: 3 days A FACE TO FACE EVALUATION WAS PERFORMED  Sonika Levins 06/04/2021, 9:33 AM

## 2021-06-04 NOTE — Progress Notes (Signed)
Physical Therapy Session Note  Patient Details  Name: Deborah Carey MRN: [id number] Date of Birth: 03-Mar-1951  Today's Date: 06/04/2021 PT Individual Time: 1100-1155 PT Individual Time Calculation (min): 55 min   Short Term Goals: Week 1:  PT Short Term Goal 1 (Week 1): =LTG due to ELOS  Skilled Therapeutic Interventions/Progress Updates:    Pt received in recliner and agreeable to therapy.  No complaint of pain. Pt's husband present for family education. Pt ambulated without AD with supervision throughout session, 200+ ft. [place] decr arm swing on R side. Car transfer with supervision. Discussed that pt's bed is similar height to car seat, so unlikely to have difficulty with bed mobility as previously assumed. Pt navigated 6" steps 3 x 4, first with supervision and BIL handrails, then with HHA for safety and confidence as pt will not have handrail at home. At this time's pt's husband departed d/t incoming storm. Pt then participated in obstacle course for improved balance and safety in community environments. Pt navigated: stepping over hockey sticks, curb step, walking up wedge (UUE support on hand rail), rockerboard and backward walking in // bars. Forward navigation x 3, side stepping navigation x 2. Small LOB x 2 with no more than CGA to recover while taking side steps. Pt then directed in step ups with CL knee drive on red wedge x 10 BIL, progressed to same exercise on rockerboard to increases stability challenge x 10 BIL. Pt then stood on descending slope of rockerboard and reached across midline BIL for improved ankle reactions and crossbody reaching. Pt returned to room and was left with all needs in reach and alarm active.   Therapy Documentation Precautions:  Precautions Precautions: Fall Precaution Comments: right sided weakness Restrictions Weight Bearing Restrictions: No     Therapy/Group: Individual Therapy  Mickel Fuchs 06/04/2021, 12:21 PM

## 2021-06-06 NOTE — Progress Notes (Signed)
Physical Therapy Session Note  Patient Details  Name: Deborah Carey MRN: [id number] Date of Birth: 1950-11-07  Today's Date: 06/06/2021 PT Individual Time: 1308-6578 and 1500-1530 PT Individual Time Calculation (min): 45 min and 30 min   Short Term Goals: Week 1:  PT Short Term Goal 1 (Week 1): =LTG due to ELOS  Skilled Therapeutic Interventions/Progress Updates:     Session 1:  Patient in recliner with RN in the room upon PT arrival. Patient alert and agreeable to PT session. Patient denied pain during session.  Focused session on d/c assessment, see d/c note, and functional mobility for Care Tool in preparation for d/c tomorrow.   Therapeutic Activity: Bed Mobility: Patient performed rolling R/L and supine to/from sit independently in ADL bed.  Transfers: Patient performed sit to/from stand from multiple household surfaces independently without AD.  Patient performed a simulated SUV height car transfer with supervision using car frame for steadying support. Provided cues for safe technique. Patient donned socks and shoes, ambulated throughout the room and performed toileting, hand hygiene, oral care, and washed her face in standing independently retrieving all items necessary without cues. PT in room, but provided no assist to patient.  Gait Training:  Patient ambulated 100-150 feet x4 without an AD with mod I. Ambulated with decreased R step height, decreased gait speed, and mild lateral sway. Provided verbal cues for increased R foot clearance, awareness of R foot drag with fatigue, and increased arm swing and gait speed for improved balance with gait. Patient ambulated up/down a ramp, over 10 feet of mulch (unlevel surface), and up/down a curb to simulate community ambulation over unlevel surfaces with close supervision without AD. Provided cues for technique and use of AD. Patient ascended/descended 12x6" steps using HHA x8 steps and no AD x4 steps with CGA. Performed step-to gait  pattern leading with R while ascending and L while descending. Provided cues for technique and sequencing, instructed to perform with L (stronger) leg leading while ascending, however, patient reports improved R leg strengthening when leading with R and no LOB.   Educated patient on fall risk/prevention, home modifications to prevent falls, and activation of emergency services in the event of a fall during session.   Patient in recliner in the room at end of session with breaks locked, chair alarm set, and all needs within reach.   Session 2: Patient in recliner in the room upon PT arrival. Patient alert and agreeable to PT session. Patient denied pain during session.  Focused session on outcome measure assessment for d/c.  Berg Balance Test Sit to Stand: Able to stand without using hands and stabilize independently Standing Unsupported: Able to stand safely 2 minutes Sitting with Back Unsupported but Feet Supported on Floor or Stool: Able to sit safely and securely 2 minutes Stand to Sit: Sits safely with minimal use of hands Transfers: Able to transfer safely, minor use of hands Standing Unsupported with Eyes Closed: Able to stand 10 seconds safely Standing Ubsupported with Feet Together: Able to place feet together independently and stand 1 minute safely From Standing, Reach Forward with Outstretched Arm: Can reach confidently >25 cm (10") From Standing Position, Pick up Object from Floor: Able to pick up shoe safely and easily From Standing Position, Turn to Look Behind Over each Shoulder: Looks behind from both sides and weight shifts well Turn 360 Degrees: Able to turn 360 degrees safely in 4 seconds or less Standing Unsupported, Alternately Place Feet on Step/Stool: Able to stand independently and safely  and complete 8 steps in 20 seconds Standing Unsupported, One Foot in Front: Able to plae foot ahead of the other independently and hold 30 seconds Standing on One Leg: Able to lift  leg independently and hold equal to or more than 3 seconds Total Score: 53/56  Dynamic Gait Index Level Surface: Normal Change in Gait Speed: Normal Gait with Horizontal Head Turns: Mild Impairment Gait with Vertical Head Turns: Mild Impairment Gait and Pivot Turn: Normal Step Over Obstacle: Normal Step Around Obstacles: Normal Steps: Mild Impairment Total Score: 21/24  Timed Up and Go Test TUG: Normal TUG Normal TUG (seconds): 8.7 (<12 sec=reduced fall risk)  Patient ambulated and performed transfers independently in her room and on [address] in the Day room. Discussed mobility and safety concerns with SLP, RN, and NT, no safety concerns noted by staff and patient with low/reduced fall risk based on outcome assessments. Patient made independent in the room. Educated on proper footwear and safety protocols. Patient in agreement. Alarms left off and sign posted on patient's door.    Patient ambulating to the bathroom in the room at end of session.    Therapy Documentation Precautions:  Precautions Precautions: Fall Precaution Comments: right sided weakness Restrictions Weight Bearing Restrictions: No    Therapy/Group: Individual Therapy  Kenn Rekowski L Emmajo Bennette PT, DPT  06/06/2021, 3:49 PM

## 2021-06-06 NOTE — Progress Notes (Signed)
Speech Language Pathology Daily Session Note  Patient Details  Name: Deborah Carey MRN: [id number] Date of Birth: 20-Nov-1950  Today's Date: 06/06/2021 SLP Individual Time: 4098-1191 SLP Individual Time Calculation (min): 43 min  Short Term Goals: Week 1: SLP Short Term Goal 1 (Week 1): STG=LTG due to ELOS  Skilled Therapeutic Interventions:Skilled ST services focused on cognitive skills. Pt demonstrated mod I recall of recent MBS results and swallow strategies for use of small sips with thin liquids. SLP facilitated education on short term recall strategies, associations and visualizations as well as word finding strategies in the task "Call It." Pt was able to demonstrate divergent naming, listing 5 members in 2 categories with extra time. Pt demonstrated recall of categories items with supervision A verbal cues. Pt was able to create associations of word(category) and abstract symbols with min A verbal cues and recall word with supervision A verbal cues for created association sentence. Pt was left in room with call bell within reach and chair alarm set. SLP recommends to continue skilled services.      Pain Pain Assessment Pain Score: 0-No pain  Therapy/Group: Individual Therapy  Shawnta Zimbelman  [facility] 06/06/2021, 3:42 PM

## 2021-06-06 NOTE — Progress Notes (Signed)
Physical Therapy Discharge Summary  Patient Details  Name: Deborah Carey MRN: [id number] Date of Birth: October 03, 1950  Today's Date: 06/06/2021   Patient has met 8 of 8 long term goals due to improved activity tolerance, improved balance, improved postural control, increased strength, and ability to compensate for deficits.  Patient to discharge at an ambulatory level Modified Independent.   Patient's care partner is independent to provide the necessary physical assistance at discharge.  Reasons goals not met: n/a  Recommendation:  Patient will benefit from ongoing skilled PT services in outpatient setting to continue to advance safe functional mobility, address ongoing impairments in balance, activity tolerance, gait and stair training, postural control, and minimize fall risk.  Equipment: No equipment provided  Reasons for discharge: treatment goals met  Patient/family agrees with progress made and goals achieved: Yes  PT Discharge Precautions/Restrictions Precautions Precautions: Fall Restrictions Weight Bearing Restrictions: No Pain Pain Assessment Pain Scale: 0-10 Pain Score: 0-No pain Pain Interference Pain Interference Pain Effect on Sleep: 0. Does not apply - I have not had any pain or hurting in the past 5 days Pain Interference with Therapy Activities: 1. Rarely or not at all Pain Interference with Day-to-Day Activities: 1. Rarely or not at all Vision/Perception  Perception Perception: Within Functional Limits Praxis Praxis: Intact  Cognition Overall Cognitive Status: Within Functional Limits for tasks assessed Arousal/Alertness: Awake/alert Orientation Level: Oriented X4 Attention: Sustained Sustained Attention: Appears intact Memory: Appears intact Decreased Short Term Memory: Functional basic;Verbal basic Immediate Memory Recall: Sock;Blue;Bed Memory Recall Sock: Without Cue Memory Recall Blue: Without Cue Memory Recall Bed: Without Cue Awareness: Appears  intact Problem Solving: Appears intact Problem Solving Impairment: Verbal basic;Functional basic Safety/Judgment: Appears intact Sensation Sensation Light Touch: Appears Intact Hot/Cold: Appears Intact Proprioception: Appears Intact Stereognosis: Not tested Coordination Gross Motor Movements are Fluid and Coordinated: Yes Fine Motor Movements are Fluid and Coordinated: Yes Motor  Motor Motor: Hemiplegia;Ataxia Motor - Skilled Clinical Observations: Mild R hemi  Mobility Bed Mobility Bed Mobility: Rolling Right;Rolling Left;Supine to Sit;Sit to Supine Rolling Right: Independent Rolling Left: Independent Supine to Sit: Independent Sit to Supine: Independent Transfers Transfers: Sit to Stand;Stand Pivot Transfers;Stand to Sit Sit to Stand: Independent Stand to Sit: Independent Stand Pivot Transfers: Independent Transfer (Assistive device): None Locomotion  Gait Ambulation: Yes Gait Assistance: Independent Gait Distance (Feet): 200 Feet Assistive device: None Gait Gait: Yes Gait Pattern: Impaired (slightly ataxic) Gait Pattern: Decreased stride length;Decreased hip/knee flexion - right;Decreased hip/knee flexion - left;Decreased dorsiflexion - right;Lateral hip instability;Trunk flexed;Narrow base of support Gait velocity: Decreased Stairs / Additional Locomotion Stairs: Yes Stairs Assistance: Contact Guard/Touching assist Stair Management Technique: One rail Right;No rails Number of Stairs: 12 Height of Stairs: 6 Ramp: Supervision/Verbal cueing Curb: Supervision/Verbal cueing Pick up small object from the floor assist level: Independent Wheelchair Mobility Wheelchair Mobility: No  Trunk/Postural Assessment  Cervical Assessment Cervical Assessment: Within Functional Limits Thoracic Assessment Thoracic Assessment: Exceptions to [facility] (kyphotic) Lumbar Assessment Lumbar Assessment: Within Functional Limits Postural Control Postural Control: Deficits on  evaluation Righting Reactions: delayed  Balance Balance Balance Assessed: Yes Standardized Balance Assessment Standardized Balance Assessment: Berg Balance Test;Dynamic Gait Index;Timed Up and Go Test Berg Balance Test Sit to Stand: Able to stand without using hands and stabilize independently Standing Unsupported: Able to stand safely 2 minutes Sitting with Back Unsupported but Feet Supported on Floor or Stool: Able to sit safely and securely 2 minutes Stand to Sit: Sits safely with minimal use of hands Transfers: Able to transfer safely, minor  use of hands Standing Unsupported with Eyes Closed: Able to stand 10 seconds safely Standing Ubsupported with Feet Together: Able to place feet together independently and stand 1 minute safely From Standing, Reach Forward with Outstretched Arm: Can reach confidently >25 cm (10") From Standing Position, Pick up Object from Floor: Able to pick up shoe safely and easily From Standing Position, Turn to Look Behind Over each Shoulder: Looks behind from both sides and weight shifts well Turn 360 Degrees: Able to turn 360 degrees safely in 4 seconds or less Standing Unsupported, Alternately Place Feet on Step/Stool: Able to stand independently and safely and complete 8 steps in 20 seconds Standing Unsupported, One Foot in Front: Able to plae foot ahead of the other independently and hold 30 seconds Standing on One Leg: Able to lift leg independently and hold equal to or more than 3 seconds Total Score: 53 Dynamic Gait Index Level Surface: Normal Change in Gait Speed: Normal Gait with Horizontal Head Turns: Mild Impairment Gait with Vertical Head Turns: Mild Impairment Gait and Pivot Turn: Normal Step Over Obstacle: Normal Step Around Obstacles: Normal Steps: Mild Impairment Total Score: 21 Timed Up and Go Test TUG: Normal TUG Normal TUG (seconds): 8.7 Static Sitting Balance Static Sitting - Balance Support: No upper extremity supported;Feet  supported Static Sitting - Level of Assistance: 7: Independent Dynamic Sitting Balance Dynamic Sitting - Balance Support: No upper extremity supported;Feet supported;During functional activity Dynamic Sitting - Level of Assistance: 7: Independent Static Standing Balance Static Standing - Balance Support: Bilateral upper extremity supported;During functional activity Static Standing - Level of Assistance: 1: +1 Total assist Dynamic Standing Balance Dynamic Standing - Balance Support: No upper extremity supported;During functional activity Dynamic Standing - Level of Assistance: 7: Independent Dynamic Standing - Balance Activities: Lateral lean/weight shifting;Forward lean/weight shifting Extremity Assessment  RLE Assessment RLE Assessment: Within Functional Limits General Strength Comments: see below RLE Strength Right Hip Flexion: 4+/5 Right Knee Flexion: 5/5 Right Knee Extension: 5/5 Right Ankle Dorsiflexion: 5/5 LLE Assessment LLE Assessment: Within Functional Limits General Strength Comments: see below LLE Strength Left Hip Flexion: 5/5 Left Knee Flexion: 5/5 Left Knee Extension: 5/5 Left Ankle Dorsiflexion: 5/5    Consuello Lassalle L Josejulian Tarango PT, DPT  06/06/2021, 8:55 AM

## 2021-06-06 NOTE — Progress Notes (Signed)
Occupational Therapy Discharge Summary  Patient Details  Name: CLARABELLE OSCARSON MRN: [id number] Date of Birth: 12-11-1950   Patient has met 11 of 11 long term goals due to improved activity tolerance, improved balance, postural control, ability to compensate for deficits, and improved coordination. Pt made steady progress with BADLs and functional transfers/amb during this admission. Pt completes all BADLs/IADLs and tranfsers with mod I/I. Pt's husband has participated in educaiton sessions and provides the appropriate level of assistance/supervision.  Patient to discharge at overall Supervision level.  Patient's care partner is independent to provide the necessary physical assistance at discharge.     Recommendation:  Patient will benefit from ongoing skilled OT services in outpatient setting to continue to advance functional skills in the area of BADL, iADL, and Reduce care partner burden.  Equipment: No equipment provided  Reasons for discharge: treatment goals met and discharge from hospital  Patient/family agrees with progress made and goals achieved: Yes  OT Discharge Vision Baseline Vision/History: 1 Wears glasses Patient Visual Report: No change from baseline Vision Assessment?: No apparent visual deficits Perception  Perception: Within Functional Limits Praxis Praxis: Intact Cognition Overall Cognitive Status: Within Functional Limits for tasks assessed Arousal/Alertness: Awake/alert Orientation Level: Oriented X4 Attention: Sustained Sustained Attention: Appears intact Memory: Appears intact Decreased Short Term Memory: Functional basic;Verbal basic Immediate Memory Recall: Sock;Blue;Bed Memory Recall Sock: Without Cue Memory Recall Blue: Without Cue Memory Recall Bed: Without Cue Awareness: Appears intact Problem Solving: Appears intact Problem Solving Impairment: Verbal basic;Functional basic Safety/Judgment: Appears intact Sensation Sensation Light Touch:  Appears Intact Proprioception: Appears Intact Coordination Gross Motor Movements are Fluid and Coordinated: Yes Fine Motor Movements are Fluid and Coordinated: Yes Motor  Motor Motor: Hemiplegia;Ataxia Motor - Skilled Clinical Observations: Mild R hemi Mobility  Bed Mobility Bed Mobility: Rolling Right;Rolling Left;Supine to Sit;Sit to Supine Rolling Right: Independent Rolling Left: Independent Supine to Sit: Independent Sit to Supine: Independent Transfers Sit to Stand: Independent Stand to Sit: Independent  Trunk/Postural Assessment  Cervical Assessment Cervical Assessment: Within Functional Limits Thoracic Assessment Thoracic Assessment: Exceptions to [facility] (kyphotic) Lumbar Assessment Lumbar Assessment: Within Functional Limits Postural Control Postural Control: Deficits on evaluation Righting Reactions: delayed  Balance Balance Balance Assessed: Yes Standardized Balance Assessment Standardized Balance Assessment: Berg Balance Test;Dynamic Gait Index;Timed Up and Go Test Berg Balance Test Sit to Stand: Able to stand without using hands and stabilize independently Standing Unsupported: Able to stand 2 minutes with supervision Sitting with Back Unsupported but Feet Supported on Floor or Stool: Able to sit safely and securely 2 minutes Stand to Sit: Sits safely with minimal use of hands Transfers: Able to transfer safely, minor use of hands Standing Unsupported with Eyes Closed: Able to stand 10 seconds with supervision Standing Ubsupported with Feet Together: Able to place feet together independently and stand for 1 minute with supervision From Standing, Reach Forward with Outstretched Arm: Reaches forward but needs supervision From Standing Position, Pick up Object from Floor: Unable to try/needs assist to keep balance From Standing Position, Turn to Look Behind Over each Shoulder: Turn sideways only but maintains balance Turn 360 Degrees: Able to turn 360 degrees safely  but slowly Standing Unsupported, Alternately Place Feet on Step/Stool: Able to complete >2 steps/needs minimal assist Standing Unsupported, One Foot in Front: Needs help to step but can hold 15 seconds Standing on One Leg: Tries to lift leg/unable to hold 3 seconds but remains standing independently Total Score: 33 Dynamic Gait Index Level Surface: Mild Impairment Change in Gait Speed:  Mild Impairment Gait with Horizontal Head Turns: Mild Impairment Gait with Vertical Head Turns: Mild Impairment Gait and Pivot Turn: Mild Impairment Step Over Obstacle: Moderate Impairment Step Around Obstacles: Mild Impairment Steps: Mild Impairment Total Score: 15 Timed Up and Go Test TUG: Normal TUG Normal TUG (seconds): 9.4 Static Sitting Balance Static Sitting - Balance Support: No upper extremity supported;Feet supported Static Sitting - Level of Assistance: 7: Independent Dynamic Sitting Balance Dynamic Sitting - Balance Support: No upper extremity supported;Feet supported;During functional activity Dynamic Sitting - Level of Assistance: 7: Independent Static Standing Balance Static Standing - Balance Support: Bilateral upper extremity supported;During functional activity Static Standing - Level of Assistance: 1: +1 Total assist Dynamic Standing Balance Dynamic Standing - Balance Support: No upper extremity supported;During functional activity Dynamic Standing - Level of Assistance: 7: Independent Dynamic Standing - Balance Activities: Lateral lean/weight shifting;Forward lean/weight shifting Extremity/Trunk Assessment       Leroy Libman 06/06/2021, 12:09 PM

## 2021-06-06 NOTE — Progress Notes (Signed)
PROGRESS NOTE   Subjective/Complaints:  Pt passed MBS- Friday.  Pt denies any issues- ready for d/c tmorrow.   ROS:  Pt denies SOB, abd pain, CP, N/V/C/D, and vision changes    Objective:   DG Swallowing Func-Speech Pathology  Result Date: 06/04/2021 Table formatting from the original result was not included. Objective Swallowing Evaluation: Type of Study: MBS-Modified Barium Swallow Study  Patient Details Name: Deborah Carey MRN: [id number] Date of Birth: Nov 15, 1950 Today's Date: 06/04/2021 Time: SLP Start Time (ACUTE ONLY): 0940 -SLP Stop Time (ACUTE ONLY): 1011 SLP Time Calculation (min) (ACUTE ONLY): 31 min Past Medical History: Past Medical History: Diagnosis Date  Allergy   allergic rhinitis  Anxiety   Cataract   removed both eyes   Infertility, female   Osteoarthritis of both knees   OA   PONV (postoperative nausea and vomiting)   Post-menopausal bleeding   neg endo bx. Past Surgical History: Past Surgical History: Procedure Laterality Date  cataract surgery  2011  CESAREAN SECTION    times 2  COLONOSCOPY  2010  hems   ENDOMETRIAL BIOPSY    neg for CA cells  FACIAL COSMETIC SURGERY  2006  face lift  FOOT SURGERY  1998  rt heel spur removal  IR CT HEAD LTD  05/25/2021  IR PERCUTANEOUS ART THROMBECTOMY/INFUSION INTRACRANIAL INC DIAG ANGIO  05/25/2021  IR US GUIDE VASC ACCESS RIGHT  05/25/2021  RADIOLOGY WITH ANESTHESIA N/A 05/25/2021  Procedure: IR WITH ANESTHESIA;  Surgeon: Aletta Edouard, MD;  Location: [facility];  Service: Radiology;  Laterality: N/A;  REFRACTIVE SURGERY  08/1999 HPI: 70 y.o. F with PMH of cataracts, anxiety, osteoarthritis who was at the cancer center with her husband when she started having right-sided weakness with left eye deviation and aphasia. CTA with left M1/MCA occlusion and she was taken emergently to IR for mechanical thrombectomy which resulted in complete recanalization. 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 DYs 3, NTL recommended.  Subjective: alert upright in chair for procedure Assessment / Plan / Recommendation CHL IP CLINICAL IMPRESSIONS 06/04/2021 Clinical Impression Patient presents with an improved swallow function as compared to previous MBS on 9/22.During oral phase, patient exhibited mild delayed transit with puree solids but more significant delay with regular solids and barium tablet. SLP noted a high palatal arch in patient's oral cavity and during mastication and oral transit of regular solids, bolus was briefly held in this space. Patient had difficulty with bolus cohesion with barium tablet and thin liquids, however was able to eventually swallow with several sips of thin liquids. She exhibited one instance of trace silent aspiration of thin liquids during her first sip of this MBS, but did not exhibit any other instances of aspiration and did not exhibit any penetration with subsequent swallows of thin liquids via cup or straw sips. Aspirate remained just under vocal cords at top of trachea and occured during the swallow. Patient exhibited swallow initiation delay to level of vallecular sinus and heading to pyriform sinus with thin liquids but only trace amounts of vallecular residuals noted with thin, regular solid texture boluses. Chin tuck posture did result in patient  initiating swallow at vallecular sinus. During esophageal sweep with barium tablet, tablet transit delayed at level of GE junction (no radiologist present to confirm) which slowly transited through esophagus. SLP is recommending upgrade patient's diet to regular solids, thin liquids at this time. SLP Visit Diagnosis Dysphagia, oropharyngeal phase (R13.12) Attention and concentration deficit following -- Frontal lobe and executive function deficit following -- Impact on safety and function Mild aspiration risk   CHL IP TREATMENT RECOMMENDATION 05/27/2021 Treatment Recommendations  Therapy as outlined in treatment plan below   Prognosis 05/27/2021 Prognosis for Safe Diet Advancement Good Barriers to Reach Goals Language deficits;Time post onset Barriers/Prognosis Comment -- CHL IP DIET RECOMMENDATION 06/04/2021 SLP Diet Recommendations Regular solids;Thin liquid Liquid Administration via Cup;Straw Medication Administration Whole meds with puree Compensations Small sips/bites;Slow rate;Minimize environmental distractions Postural Changes Seated upright at 90 degrees   CHL IP OTHER RECOMMENDATIONS 06/04/2021 Recommended Consults -- Oral Care Recommendations Oral care BID;Patient independent with oral care Other Recommendations --   CHL IP FOLLOW UP RECOMMENDATIONS 06/01/2021 Follow up Recommendations Inpatient Rehab   CHL IP FREQUENCY AND DURATION 05/28/2021 Speech Therapy Frequency (ACUTE ONLY) min 2x/week Treatment Duration --      CHL IP ORAL PHASE 06/04/2021 Oral Phase Impaired Oral - Pudding Teaspoon -- Oral - Pudding Cup -- Oral - Honey Teaspoon -- Oral - Honey Cup NT Oral - Nectar Teaspoon -- Oral - Nectar Cup NT Oral - Nectar Straw -- Oral - Thin Teaspoon -- Oral - Thin Cup WFL Oral - Thin Straw WFL Oral - Puree WFL Oral - Mech Soft Piecemeal swallowing;Delayed oral transit Oral - Regular -- Oral - Multi-Consistency -- Oral - Pill Decreased bolus cohesion;Reduced posterior propulsion Oral Phase - Comment --  CHL IP PHARYNGEAL PHASE 06/04/2021 Pharyngeal Phase Impaired Pharyngeal- Pudding Teaspoon -- Pharyngeal -- Pharyngeal- Pudding Cup -- Pharyngeal -- Pharyngeal- Honey Teaspoon -- Pharyngeal -- Pharyngeal- Honey Cup NT Pharyngeal -- Pharyngeal- Nectar Teaspoon -- Pharyngeal -- Pharyngeal- Nectar Cup NT Pharyngeal -- Pharyngeal- Nectar Straw -- Pharyngeal -- Pharyngeal- Thin Teaspoon -- Pharyngeal -- Pharyngeal- Thin Cup Delayed swallow initiation-vallecula;Delayed swallow initiation-pyriform sinuses;Penetration/Aspiration during swallow;Trace aspiration Pharyngeal Material enters airway,  passes BELOW cords without attempt by patient to eject out (silent aspiration) Pharyngeal- Thin Straw Delayed swallow initiation-pyriform sinuses;Delayed swallow initiation-vallecula Pharyngeal -- Pharyngeal- Puree -- Pharyngeal -- Pharyngeal- Mechanical Soft Delayed swallow initiation-vallecula;Pharyngeal residue - valleculae Pharyngeal -- Pharyngeal- Regular -- Pharyngeal -- Pharyngeal- Multi-consistency -- Pharyngeal -- Pharyngeal- Pill WFL Pharyngeal -- Pharyngeal Comment --  CHL IP CERVICAL ESOPHAGEAL PHASE 06/04/2021 Cervical Esophageal Phase WFL Pudding Teaspoon -- Pudding Cup -- Honey Teaspoon -- Honey Cup -- Nectar Teaspoon -- Nectar Cup -- Nectar Straw -- Thin Teaspoon -- Thin Cup -- Thin Straw -- Puree -- Mechanical Soft -- Regular -- Multi-consistency -- Pill -- Cervical Esophageal Comment -- Deborah Baller, MA, CCC-SLP Speech Therapy             No results for input(s): WBC, HGB, HCT, PLT in the last 72 hours.  No results for input(s): NA, K, CL, CO2, GLUCOSE, BUN, CREATININE, CALCIUM in the last 72 hours.   Intake/Output Summary (Last 24 hours) at 06/06/2021 0953 Last data filed at 06/06/2021 0700 Gross per 24 hour  Intake 720 ml  Output --  Net 720 ml        Physical Exam: Vital Signs Blood pressure 115/74, pulse 87, temperature 98.6 F (37 C), temperature source Oral, resp. rate 18, height 5\' 4"  (1.626 m), weight 94.2 kg, last menstrual period  09/05/2008, SpO2 98 %.       General: awake, alert, appropriate, sitting up in bed; NAD HENT: conjugate gaze; oropharynx moist CV: regular rate; no JVD Pulmonary: CTA B/L; no W/R/R- good air movement GI: soft, NT, ND, (+)BS Psychiatric: appropriate Neurological: Ox3; still slow to respond/process appropriately Musculoskeletal:     Cervical back: Normal range of motion and neck supple.     Comments: RUE up to 5-/5 today LUE- 5/5 RLE HF, KE, KF, DF and PF 4+/5- almost 5-/5 today LLE- 5/5   Skin:    General: Skin is warm and  dry.     Comments: Big bruise; RUE upper arm; and L hand  Neurological:  Slightly slowed processing-    Assessment/Plan: 1. Functional deficits which require 3+ hours per day of interdisciplinary therapy in a comprehensive inpatient rehab setting. Physiatrist is providing close team supervision and 24 hour management of active medical problems listed below. Physiatrist and rehab team continue to assess barriers to discharge/monitor patient progress toward functional and medical goals  Care Tool:  Bathing    Body parts bathed by patient: Right arm, Left arm, Chest, Abdomen, Front perineal area, Buttocks, Right upper leg, Left upper leg, Right lower leg, Face, Left lower leg         Bathing assist Assist Level: Set up assist     Upper Body Dressing/Undressing Upper body dressing   What is the patient wearing?: Bra, Pull over shirt    Upper body assist Assist Level: Set up assist    Lower Body Dressing/Undressing Lower body dressing      What is the patient wearing?: Underwear/pull up, Pants     Lower body assist Assist for lower body dressing: Supervision/Verbal cueing (cuing for safety - attempted to put pants on in standing)     Toileting Toileting    Toileting assist Assist for toileting: Contact Guard/Touching assist     Transfers Chair/bed transfer  Transfers assist     Chair/bed transfer assist level: Supervision/Verbal cueing     Locomotion Ambulation   Ambulation assist      Assist level: Supervision/Verbal cueing Assistive device: No Device Max distance: 200'   Walk 10 feet activity   Assist     Assist level: Supervision/Verbal cueing Assistive device: No Device   Walk 50 feet activity   Assist    Assist level: Supervision/Verbal cueing Assistive device: No Device    Walk 150 feet activity   Assist    Assist level: Supervision/Verbal cueing Assistive device: No Device    Walk 10 feet on uneven surface   activity   Assist     Assist level: Minimal Assistance - Patient > 75%     Wheelchair     Assist Is the patient using a wheelchair?: No             Wheelchair 50 feet with 2 turns activity    Assist            Wheelchair 150 feet activity     Assist          Blood pressure 115/74, pulse 87, temperature 98.6 F (37 C), temperature source Oral, resp. rate 18, height 5\' 4"  (1.626 m), weight 94.2 kg, last menstrual period 09/05/2008, SpO2 98 %.  Medical Problem List and Plan: 1.  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    Con't CIR_ PT/OT and SLP- d/c 10/3- MBS today!  -con't PT and OT_ d/c tomorrow- passed MBS-  2.  Antithrombotics: -DVT/anticoagulation:  Pharmaceutical: Other (comment)--Eliquis.              -antiplatelet therapy: N/A 3. Pain Management: Tylenol prn.  9/29- denies pain- con't regimen 4. Mood: LCSW to follow for evaluation and support.              -antipsychotic agents: N/A 5. Neuropsych: This patient is capable of making decisions on her own behalf. 6. Skin/Wound Care: Routine pressure relief measures.  7. Fluids/Electrolytes/Nutrition: Monitor I/O. Check lytes in am. 8. A fib: Monitor HR TID--continue metoprolol and Eliquis.  9/30- rate controlled- con't regimen 9.  Dyslipidemia: Now on Lipitor.  10. Dysphagia: Continue DIII w/nectar liquids and supervision for safety.   9/29- will speak with SLP about MBS again?  9./30- MBS today- con't regimen  10/2- passed MBS- on regular thickness diet 11. Impaired fasting sugars: Hgb A1C-5.8. Add carb modifications to diet.    9/28- BG's controlled- con't regimen 12. Mild slowed processing  9/29- doesn't need meds, but con't SLP.  13. Insomnia  9/29- will order trazodone 50 mg QHS prn to take if  cannot sleep well.       LOS: 5 days A FACE TO FACE EVALUATION WAS PERFORMED  Deborah Carey 06/06/2021, 9:53 AM

## 2021-06-06 NOTE — Progress Notes (Signed)
Occupational Therapy Session Note  Patient Details  Name: Deborah Carey MRN: [id number] Date of Birth: 1951/08/10  Today's Date: 06/06/2021 OT Individual Time: 1100-1158 OT Individual Time Calculation (min): 58 min    Short Term Goals: Week 1:  OT Short Term Goal 1 (Week 1): STG= LTG 2/2 ELOS  Skilled Therapeutic Interventions/Progress Updates:    Pt resting in bed upon arrival and agreeable to getting OOB for therapy. OT intervention with focus on functional amb without AD, standing balance, problem solving with puzzles, pathfinding, safety awareness, and activity tolerance. Amb from room to gym and engaged in standing balance activity tossing ball against rebounder with supervision. Pt completed colored peg board patterns and lego patterns with one verbal cues for 2/4 puzzles. Pt stood at hi lo table to complete strip puzzles with one verbal cue.Pt was able to find room independently after leaving gym. Pt requested to use bathroom and completed toileting tasks without assistance. No safety issued noted. No LOB throughout session. Pt remeained seated in recliner with all needs within reach. SEat alarm activated.   Therapy Documentation Precautions:  Precautions Precautions: Fall Precaution Comments: right sided weakness Restrictions Weight Bearing Restrictions: No Pain: Pt denies pain this morning   Therapy/Group: Individual Therapy  Leroy Libman 06/06/2021, 12:03 PM

## 2021-06-07 DIAGNOSIS — K8020 Calculus of gallbladder without cholecystitis without obstruction: Secondary | ICD-10-CM

## 2021-06-07 DIAGNOSIS — R911 Solitary pulmonary nodule: Secondary | ICD-10-CM

## 2021-06-07 DIAGNOSIS — R222 Localized swelling, mass and lump, trunk: Secondary | ICD-10-CM

## 2021-06-07 LAB — CBC
HCT: 35.3 % — ABNORMAL LOW (ref 36.0–46.0)
Hemoglobin: 12 g/dL (ref 12.0–15.0)
MCH: 31.7 pg (ref 26.0–34.0)
MCHC: 34 g/dL (ref 30.0–36.0)
MCV: 93.1 fL (ref 80.0–100.0)
Platelets: 238 10*3/uL (ref 150–400)
RBC: 3.79 MIL/uL — ABNORMAL LOW (ref 3.87–5.11)
RDW: 12 % (ref 11.5–15.5)
WBC: 10.7 10*3/uL — ABNORMAL HIGH (ref 4.0–10.5)
nRBC: 0 % (ref 0.0–0.2)

## 2021-06-07 LAB — BASIC METABOLIC PANEL
Anion gap: 9 (ref 5–15)
BUN: 17 mg/dL (ref 8–23)
CO2: 23 mmol/L (ref 22–32)
Calcium: 8.9 mg/dL (ref 8.9–10.3)
Chloride: 107 mmol/L (ref 98–111)
Creatinine, Ser: 0.73 mg/dL (ref 0.44–1.00)
GFR, Estimated: >60 mL/min (ref >60)
Glucose, Bld: 111 mg/dL — ABNORMAL HIGH (ref 70–99)
Potassium: 4.1 mmol/L (ref 3.5–5.1)
Sodium: 139 mmol/L (ref 135–145)

## 2021-06-07 MED ORDER — APIXABAN 5 MG PO TABS: 5.0000 mg | ORAL_TABLET | Freq: Two times a day (BID) | ORAL | 0 refills | Status: DC

## 2021-06-07 MED ORDER — SENNOSIDES-DOCUSATE SODIUM 8.6-50 MG PO TABS: 2.0000 | ORAL_TABLET | Freq: Every evening | ORAL | 0 refills | Status: DC | PRN

## 2021-06-07 MED ORDER — METOPROLOL SUCCINATE ER 25 MG PO TB24: 25.0000 mg | ORAL_TABLET | Freq: Every day | ORAL | 0 refills | Status: DC

## 2021-06-07 MED ORDER — ATORVASTATIN CALCIUM 40 MG PO TABS: 40.0000 mg | ORAL_TABLET | Freq: Every day | ORAL | 0 refills | Status: DC

## 2021-06-07 MED ORDER — PANTOPRAZOLE SODIUM 40 MG PO TBEC: 40.0000 mg | DELAYED_RELEASE_TABLET | Freq: Every day | ORAL | 0 refills | Status: DC

## 2021-06-07 NOTE — Progress Notes (Signed)
Speech Language Pathology Discharge Summary  Patient Details  Name: Deborah Carey MRN: [id number] Date of Birth: 06-Sep-1950  Today's Date: 06/07/2021 SLP Individual Time: 0802-0830 SLP Individual Time Calculation (min): 28 min   Skilled Therapeutic Interventions: Skilled ST services focused on education, cognitive and language skills. Pt was able to recall yesterday's ST events with supervision A fade to mod I. Pt required supervision A verbal cues to recall memory strategies of written aid, association and visualization. SLP provided handout for memory strategies. Pt participated in in cognitive linguistic reassessment SLUMS, pt scored 25/30 (n=>27) indicating very deficits in divergent naming and short term recall. SLP provided education on results and recommends f/u OPST services. All questions answered to satisfaction.     Patient has met 5 of 5 long term goals.  Patient to discharge at overall Modified Independent;Supervision level.  Reasons goals not met: N/A   Clinical Impression/Discharge Summary:   Pt made good progress meeting 5 out 5 goals, discharging at [place] I level. Pt participated in [place] demonstrating WFL oropharyngeal function with ability to consume thin liquids via straw/cups and regular textures with swallow strategies to utilize small sips. NO further dysphagia services needed at this time. Pt demonstrated mod I use of strategies for word finding in sentence level and supervision A semantic cues in conversation, as well as recall of short term recall strategies mod , however SLP recommends OTST services to continue mod I use of strategies as complexity level increases. Pt benefited for skilled services in order to maximize functional independence and reduce burden of care, recommending OTST services.   Care Partner:  Caregiver Able to Provide Assistance: Yes  Type of Caregiver Assistance: Physical;Cognitive  Recommendation:  Outpatient SLP  Rationale for SLP  Follow Up: Maximize functional communication;Maximize cognitive function and independence;Reduce caregiver burden   Equipment: N/A   Reasons for discharge: Discharged from hospital   Patient/Family Agrees with Progress Made and Goals Achieved: Yes    [name] 06/07/2021, 8:12 AM

## 2021-06-07 NOTE — Progress Notes (Signed)
Inpatient Rehabilitation Care Coordinator Discharge Note   Patient Details  Name: Deborah Carey MRN: [id number] Date of Birth: 1951/08/23   Discharge location: D/c to home with 24/7 care and support from friends PRN  Length of Stay: 5 days  Discharge activity level: Supervision  Home/community participation: Limited  Patient response XL:KGMWNU Literacy - How often do you need to have someone help you when you read instructions, pamphlets, or other written material from your doctor or pharmacy?: Never  Patient response UV:OZDGUY Isolation - How often do you feel lonely or isolated from those around you?: Never  Services provided included: MD, PT, RD, OT, SLP, RN, TR, Pharmacy, Neuropsych, SW, CM  Financial Services:  Charity fundraiser Utilized: [place] Medicare  Choices offered to/list presented to: Yes  Follow-up services arranged:  Outpatient, DME    Outpatient Servicies:  Outpatient Rehab DME : N/A  Patient response to transportation need: Is the patient able to respond to transportation needs?: Yes In the past 12 months, has lack of transportation kept you from medical appointments or from getting medications?: No In the past 12 months, has lack of transportation kept you from meetings, work, or from getting things needed for daily living?: No  Comments (or additional information):  Patient/Family verbalized understanding of follow-up arrangements:  Yes  Individual responsible for coordination of the follow-up plan: contact pt husband  [name] [phone]  Confirmed correct DME delivered: Rana Snare 06/07/2021    Rana Snare

## 2021-06-07 NOTE — Progress Notes (Signed)
Inpatient Rehabilitation Medication Review by a Pharmacist  A complete drug regimen review was completed for this patient to identify any potential clinically significant medication issues.  High Risk Drug Classes Is patient taking? Indication by Medication  Antipsychotic No   Anticoagulant Yes Apixaban for afib  Antibiotic No   Opioid No   Antiplatelet No   Hypoglycemics/insulin No   Vasoactive Medication Yes Toprol for afib, Lipitor for hyperlipidemia  Chemotherapy No   Other Yes Protonix PRN for GERD, Senokot-s PRN for constipation, PRN Tylenol for pain    Clinically significant medication issues were identified that warrant physician communication and completion of prescribed/recommended actions by midnight of the next day:  No  Name of provider notified for urgent issues identified: n/a  Provider Method of Notification: n/a    Pharmacist comments:   Time spent performing this drug regimen review (minutes):  [place], PharmD, BCPS Please check AMION for all [place] contact numbers Clinical Pharmacist 06/07/2021 11:12 AM

## 2021-06-07 NOTE — Progress Notes (Signed)
PROGRESS NOTE   Subjective/Complaints:  Pt reports ready for d/c- denies any Sx's of illness.   Asking if needs something for DM- explained will allow PCP to make that decision. See PCP in next 30 days to get refills- she has one.    ROS:  Pt denies SOB, abd pain, CP, N/V/C/D, and vision changes    Objective:   No results found. Recent Labs    06/07/21 0508  WBC 10.7*  HGB 12.0  HCT 35.3*  PLT 238    Recent Labs    06/07/21 0508  NA 139  K 4.1  CL 107  CO2 23  GLUCOSE 111*  BUN 17  CREATININE 0.73  CALCIUM 8.9     Intake/Output Summary (Last 24 hours) at 06/07/2021 0838 Last data filed at 06/06/2021 1839 Gross per 24 hour  Intake 560 ml  Output --  Net 560 ml        Physical Exam: Vital Signs Blood pressure 112/68, pulse 62, temperature 98.5 F (36.9 C), temperature source Oral, resp. rate 18, height 5\' 4"  (1.626 m), weight 94.2 kg, last menstrual period 09/05/2008, SpO2 98 %.        General: awake, alert, appropriate, sitting up in bedside chair; NAD HENT: conjugate gaze; oropharynx moist CV: regular rate; no JVD Pulmonary: CTA B/L; no W/R/R- good air movement GI: soft, NT, ND, (+)BS Psychiatric: appropriate Neurological: Ox3- slightly slowed processing  Musculoskeletal:     Cervical back: Normal range of motion and neck supple.     Comments: RUE up to 5-/5 today LUE- 5/5 RLE HF, KE, KF, DF and PF 4+/5- almost 5-/5 today LLE- 5/5   Skin:    General: Skin is warm and dry.     Comments: Big bruise; RUE upper arm; and L hand  Neurological:  Slightly slowed processing-    Assessment/Plan: 1. Functional deficits which require 3+ hours per day of interdisciplinary therapy in a comprehensive inpatient rehab setting. Physiatrist is providing close team supervision and 24 hour management of active medical problems listed below. Physiatrist and rehab team continue to assess barriers  to discharge/monitor patient progress toward functional and medical goals  Care Tool:  Bathing    Body parts bathed by patient: Right arm, Left arm, Chest, Abdomen, Front perineal area, Buttocks, Right upper leg, Left upper leg, Right lower leg, Face, Left lower leg         Bathing assist Assist Level: Independent with assistive device     Upper Body Dressing/Undressing Upper body dressing   What is the patient wearing?: Bra, Pull over shirt    Upper body assist Assist Level: Independent    Lower Body Dressing/Undressing Lower body dressing      What is the patient wearing?: Underwear/pull up, Pants     Lower body assist Assist for lower body dressing: Independent with assitive device     Toileting Toileting    Toileting assist Assist for toileting: Independent with assistive device     Transfers Chair/bed transfer  Transfers assist     Chair/bed transfer assist level: Supervision/Verbal cueing     Locomotion Ambulation   Ambulation assist      Assist level:  Supervision/Verbal cueing Assistive device: No Device Max distance: 200'   Walk 10 feet activity   Assist     Assist level: Supervision/Verbal cueing Assistive device: No Device   Walk 50 feet activity   Assist    Assist level: Supervision/Verbal cueing Assistive device: No Device    Walk 150 feet activity   Assist    Assist level: Supervision/Verbal cueing Assistive device: No Device    Walk 10 feet on uneven surface  activity   Assist     Assist level: Minimal Assistance - Patient > 75%     Wheelchair     Assist Is the patient using a wheelchair?: No             Wheelchair 50 feet with 2 turns activity    Assist            Wheelchair 150 feet activity     Assist          Blood pressure 112/68, pulse 62, temperature 98.5 F (36.9 C), temperature source Oral, resp. rate 18, height 5\' 4"  (1.626 m), weight 94.2 kg, last menstrual period  09/05/2008, SpO2 98 %.  Medical Problem List and Plan: 1.  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    Con't CIR_ PT/OT and SLP- d/c 10/3- MBS today!  -con't PT and OT_ d/c tomorrow- passed MBS-   -d/c today- will need f/u with Dr Dagoberto Ligas at least 1x.  2.  Antithrombotics: -DVT/anticoagulation:  Pharmaceutical: Other (comment)--Eliquis.              -antiplatelet therapy: N/A 3. Pain Management: Tylenol prn.  9/29- denies pain- con't regimen 4. Mood: LCSW to follow for evaluation and support.              -antipsychotic agents: N/A 5. Neuropsych: This patient is capable of making decisions on her own behalf. 6. Skin/Wound Care: Routine pressure relief measures.  7. Fluids/Electrolytes/Nutrition: Monitor I/O. Check lytes in am. 8. A fib: Monitor HR TID--continue metoprolol and Eliquis.  9/30- rate controlled- con't regimen 9.  Dyslipidemia: Now on Lipitor.  10. Dysphagia: Continue DIII w/nectar liquids and supervision for safety.   9/29- will speak with SLP about MBS again?  9./30- MBS today- con't regimen  10/2- passed MBS- on regular thickness diet 11. Impaired fasting sugars: Hgb A1C-5.8. Add carb modifications to diet.    9/28- BG's controlled- con't regimen 12. Mild slowed processing  9/29- doesn't need meds, but con't SLP.  13. Insomnia  9/29- will order trazodone 50 mg QHS prn to take if cannot sleep well.      Went over pt's meds with her- likely won't need Protonix- but will need Apixiban, Metoprolol and Statin.     LOS: 6 days A FACE TO FACE EVALUATION WAS PERFORMED  Andie Mortimer 06/07/2021, 8:38 AM

## 2021-06-07 NOTE — Progress Notes (Signed)
INPATIENT REHABILITATION DISCHARGE NOTE   Discharge instructions by: Reesa Chew, PA  Verbalized understanding: by patient and spouse  Skin care/Wound care: no skin issues   Pain: no pain  IV's: none  Tubes/Drains: none  Safety instructions: fall risk   Patient belongings: taken by spouse  Discharged to: home  Discharged via: private car   Notes:

## 2021-06-07 NOTE — Discharge Summary (Signed)
Physician Discharge Summary  Patient ID: [name] MRN: [id number] DOB/AGE: Apr 13, 1951 70 y.o.  Admit date: 06/01/2021 Discharge date: 06/07/2021  Discharge Diagnoses:  Principal Problem:   Acute ischemic left middle cerebral artery (MCA) stroke [facility]) Active Problems:   Prediabetes   Atrial fibrillation (HCC)   Cholelithiasis without cholecystitis   Mass in chest   Discharged Condition: good  Significant Diagnostic Studies: DG Swallowing Func-Speech Pathology  Result Date: 06/04/2021 Table formatting from the original result was not included. Objective Swallowing Evaluation: Type of Study: MBS-Modified Barium Swallow Study  Patient Details Name: Deborah Carey MRN: [id number] Date of Birth: 05-Jan-1951 Today's Date: 06/04/2021 Time: SLP Start Time (ACUTE ONLY): 0940 -SLP Stop Time (ACUTE ONLY): 1011 SLP Time Calculation (min) (ACUTE ONLY): 31 min Past Medical History: Past Medical History: Diagnosis Date  Allergy   allergic rhinitis  Anxiety   Cataract   removed both eyes   Infertility, female   Osteoarthritis of both knees   OA   PONV (postoperative nausea and vomiting)   Post-menopausal bleeding   neg endo bx. Past Surgical History: Past Surgical History: Procedure Laterality Date  cataract surgery  2011  CESAREAN SECTION    times 2  COLONOSCOPY  2010  hems   ENDOMETRIAL BIOPSY    neg for CA cells  FACIAL COSMETIC SURGERY  2006  face lift  FOOT SURGERY  1998  rt heel spur removal  IR CT HEAD LTD  05/25/2021  IR PERCUTANEOUS ART THROMBECTOMY/INFUSION INTRACRANIAL INC DIAG ANGIO  05/25/2021  IR US GUIDE VASC ACCESS RIGHT  05/25/2021  RADIOLOGY WITH ANESTHESIA N/A 05/25/2021  Procedure: IR WITH ANESTHESIA;  Surgeon: Aletta Edouard, MD;  Location: [facility];  Service: Radiology;  Laterality: N/A;  REFRACTIVE SURGERY  08/1999 HPI: 70 y.o. F with PMH of cataracts, anxiety, osteoarthritis who was at the cancer center with her husband when she started having right-sided weakness with left eye deviation and  aphasia. CTA with left M1/MCA occlusion and she was taken emergently to IR for mechanical thrombectomy which resulted in complete recanalization. 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 DYs 3, NTL recommended.  Subjective: alert upright in chair for procedure Assessment / Plan / Recommendation CHL IP CLINICAL IMPRESSIONS 06/04/2021 Clinical Impression Patient presents with an improved swallow function as compared to previous MBS on 9/22.During oral phase, patient exhibited mild delayed transit with puree solids but more significant delay with regular solids and barium tablet. SLP noted a high palatal arch in patient's oral cavity and during mastication and oral transit of regular solids, bolus was briefly held in this space. Patient had difficulty with bolus cohesion with barium tablet and thin liquids, however was able to eventually swallow with several sips of thin liquids. She exhibited one instance of trace silent aspiration of thin liquids during her first sip of this MBS, but did not exhibit any other instances of aspiration and did not exhibit any penetration with subsequent swallows of thin liquids via cup or straw sips. Aspirate remained just under vocal cords at top of trachea and occured during the swallow. Patient exhibited swallow initiation delay to level of vallecular sinus and heading to pyriform sinus with thin liquids but only trace amounts of vallecular residuals noted with thin, regular solid texture boluses. Chin tuck posture did result in patient initiating swallow at vallecular sinus. During esophageal sweep with barium tablet, tablet transit delayed at level of GE junction (no radiologist present to confirm) which slowly  transited through esophagus. SLP is recommending upgrade patient's diet to regular solids, thin liquids at this time. SLP Visit Diagnosis Dysphagia, oropharyngeal phase (R13.12) Attention and concentration deficit  following -- Frontal lobe and executive function deficit following -- Impact on safety and function Mild aspiration risk   CHL IP TREATMENT RECOMMENDATION 05/27/2021 Treatment Recommendations Therapy as outlined in treatment plan below   Prognosis 05/27/2021 Prognosis for Safe Diet Advancement Good Barriers to Reach Goals Language deficits;Time post onset Barriers/Prognosis Comment -- CHL IP DIET RECOMMENDATION 06/04/2021 SLP Diet Recommendations Regular solids;Thin liquid Liquid Administration via Cup;Straw Medication Administration Whole meds with puree Compensations Small sips/bites;Slow rate;Minimize environmental distractions Postural Changes Seated upright at 90 degrees   CHL IP OTHER RECOMMENDATIONS 06/04/2021 Recommended Consults -- Oral Care Recommendations Oral care BID;Patient independent with oral care Other Recommendations --   CHL IP FOLLOW UP RECOMMENDATIONS 06/01/2021 Follow up Recommendations Inpatient Rehab   CHL IP FREQUENCY AND DURATION 05/28/2021 Speech Therapy Frequency (ACUTE ONLY) min 2x/week Treatment Duration --      CHL IP ORAL PHASE 06/04/2021 Oral Phase Impaired Oral - Pudding Teaspoon -- Oral - Pudding Cup -- Oral - Honey Teaspoon -- Oral - Honey Cup NT Oral - Nectar Teaspoon -- Oral - Nectar Cup NT Oral - Nectar Straw -- Oral - Thin Teaspoon -- Oral - Thin Cup WFL Oral - Thin Straw WFL Oral - Puree WFL Oral - Mech Soft Piecemeal swallowing;Delayed oral transit Oral - Regular -- Oral - Multi-Consistency -- Oral - Pill Decreased bolus cohesion;Reduced posterior propulsion Oral Phase - Comment --  CHL IP PHARYNGEAL PHASE 06/04/2021 Pharyngeal Phase Impaired Pharyngeal- Pudding Teaspoon -- Pharyngeal -- Pharyngeal- Pudding Cup -- Pharyngeal -- Pharyngeal- Honey Teaspoon -- Pharyngeal -- Pharyngeal- Honey Cup NT Pharyngeal -- Pharyngeal- Nectar Teaspoon -- Pharyngeal -- Pharyngeal- Nectar Cup NT Pharyngeal -- Pharyngeal- Nectar Straw -- Pharyngeal -- Pharyngeal- Thin Teaspoon -- Pharyngeal --  Pharyngeal- Thin Cup Delayed swallow initiation-vallecula;Delayed swallow initiation-pyriform sinuses;Penetration/Aspiration during swallow;Trace aspiration Pharyngeal Material enters airway, passes BELOW cords without attempt by patient to eject out (silent aspiration) Pharyngeal- Thin Straw Delayed swallow initiation-pyriform sinuses;Delayed swallow initiation-vallecula Pharyngeal -- Pharyngeal- Puree -- Pharyngeal -- Pharyngeal- Mechanical Soft Delayed swallow initiation-vallecula;Pharyngeal residue - valleculae Pharyngeal -- Pharyngeal- Regular -- Pharyngeal -- Pharyngeal- Multi-consistency -- Pharyngeal -- Pharyngeal- Pill WFL Pharyngeal -- Pharyngeal Comment --  CHL IP CERVICAL ESOPHAGEAL PHASE 06/04/2021 Cervical Esophageal Phase WFL Pudding Teaspoon -- Pudding Cup -- Honey Teaspoon -- Honey Cup -- Nectar Teaspoon -- Nectar Cup -- Nectar Straw -- Thin Teaspoon -- Thin Cup -- Thin Straw -- Puree -- Mechanical Soft -- Regular -- Multi-consistency -- Pill -- Cervical Esophageal Comment -- Sonia Baller, MA, CCC-SLP Speech Therapy                Labs:  Basic Metabolic Panel: Recent Labs  Lab 06/02/21 0524 06/07/21 0508  NA 136 139  K 4.2 4.1  CL 103 107  CO2 27 23  GLUCOSE 111* 111*  BUN 17 17  CREATININE 0.84 0.73  CALCIUM 9.0 8.9    CBC: Recent Labs  Lab 06/02/21 0524 06/07/21 0508  WBC 10.3 10.7*  NEUTROABS 6.6  --   HGB 13.3 12.0  HCT 37.7 35.3*  MCV 91.5 93.1  PLT 219 238    CBG: Recent Labs  Lab 05/31/21 2351 06/01/21 0354 06/01/21 0800 06/01/21 1222 06/01/21 1637  GLUCAP 95 109* 109* 138* 109*    Brief HPI:   CHERICA HEIDEN is a 70 y.o. female  with history of OA, anxiety disorder who was admitted to [facility] on 05/25/2021 with acute onset of right-sided weakness, leftward gaze and inability to speak.  CTA head/neck showed occlusion of left-M1 MCA with large left-MCA ischemia as well as incidental soft tissue nodule medial right apex with recommendation of CT chest in  3 months.  Hospital course significant for acute respiratory failure requiring intubation prior to transfer to [facility].  She received clinically and underwent cerebral angio with thrombectomy of proximal left-M1/MCA occlusion with complete recanalization.  Postop course significant for issues with PAF, hypertension as well as emesis and abdominal pain.  CT abdomen pelvis showed cholelithiasis without cystitis and diverticulosis without diverticulitis.  She was treated with IV fluids as well as albumin with improvement in GI symptoms. MRA brain done showing patency of left-MCA branches and MRI brain showed left MCA infarct, 3 punctate infarcts in R-frontal lobe and probable micro emboli in the R-ACA.  Dr. Erlinda Hong felt that stroke was embolic due to newly diagnosed A. fib and she was transition to [place] on 09/24.  Swallow evaluation done showing mild to moderate dysphagia and she was placed on D3 with nectar liquids.  She continued to be limited by aphasia, dysphagia as well as weakness affecting mobility and ADLs.  CIR was recommended due to functional decline.   Hospital Course: TERRIYAH WESTRA was admitted to rehab 06/01/2021 for inpatient therapies to consist of PT, ST and OT at least three hours five days a week. Past admission physiatrist, therapy team and rehab RN have worked together to provide customized collaborative inpatient rehab.  Her blood pressures and heart rate were monitored on TID basis and have been stable on metoprolol.  She continues on Lipitor for dyslipidemia.  Carb modified restrictions were added to diet due to prediabetes and blood sugars are controlled overall.  She was maintained on dysphagia 3 nectar liquids and as swallow function improved and she was advanced to regular diet with thin liquids after MBS on 09/30.  She is continent of bowel and bladder.  Follow-up check of CBC showed mild leukocytosis without signs of infection.  H&H is relatively stable.  Check of CMET showed LFTs  and LFTs to  be essentially WNL. Her verbal output and cognition have improved  She has made good gains and is modified independent at discharge.  She will continue to receive outpatient PT, OT and SLP at [facility] outpatient rehab after discharge  Rehab course: During patient's stay in rehab weekly t brief team conference was held to discuss patient's progress, set goals and to discuss barriers to discharge. At admission, patient required min assist with basic ADL tasks and with mobility.  She exhibited mild expressive aphasia, dysphagia requiring modified diet as well as delay in processing. She  has had improvement in activity tolerance, balance, postural control as well as ability to compensate for deficits.  She is able to complete BADLs/IADLs tasks at modified independent level.  She is modified independent for transfers and to ambulate 200 feet without assistive device.  Her Berg balance score has improved to 53/56. She is tolerating regular diet without signs or symptoms of aspiration.  She requires is able to use strategies for word finding at sentence level and supervision with cues for utilization of memory strategies. Family education was completed with husband  Disposition: Home  Diet: Heart healthy/Carb Modified.   Special Instructions: Will  need CT in 3 months for follow-up on right apex nodule. 2.  No driving or strenuous activity  till cleared by MD.  Discharge Instructions     Ambulatory referral to Occupational Therapy   Complete by: As directed    Eval and treat   Ambulatory referral to Physical Medicine Rehab   Complete by: As directed    1-2 weeks TC appt   Ambulatory referral to Physical Therapy   Complete by: As directed    Eval and Treat   Ambulatory referral to Speech Therapy   Complete by: As directed    Eval and treat      Allergies as of 06/07/2021   No Known Allergies      Medication List     STOP taking these medications    insulin aspart 100 UNIT/ML  injection Commonly known as: novoLOG       TAKE these medications    acetaminophen 325 MG tablet Commonly known as: TYLENOL Take 1-2 tablets (325-650 mg total) by mouth every 4 (four) hours as needed for mild pain. What changed:  how much to take reasons to take this   apixaban 5 MG Tabs tablet Commonly known as: ELIQUIS Take 1 tablet (5 mg total) by mouth 2 (two) times daily.   atorvastatin 40 MG tablet Commonly known as: LIPITOR Take 1 tablet (40 mg total) by mouth daily.   metoprolol succinate 25 MG 24 hr tablet Commonly known as: TOPROL-XL Take 1 tablet (25 mg total) by mouth daily.   pantoprazole 40 MG tablet Commonly known as: PROTONIX Take 1 tablet (40 mg total) by mouth daily. For reflux--can use as needed if symptoms controlled. What changed: additional instructions   senna-docusate 8.6-50 MG tablet Commonly known as: Senokot-S Take 2 tablets by mouth at bedtime as needed for mild constipation. What changed: how much to take        Follow-up Information     Lovorn, Jinny Blossom, MD Follow up.   Specialty: Physical Medicine and Rehabilitation Why: office will call you with follow up appointment Contact information: [address] [place] [address] [phone]         Abner Greenspan, MD. Call today.   Specialties: Family Medicine, Radiology Why: for post hospital follow up Contact information: [place] [address] [place]. Call.   Why: for stroke follow up Contact information: [address]     [place] [address] [phone]                Signed: Bary Leriche 06/07/2021, 10:13 AM

## 2021-06-07 NOTE — Progress Notes (Signed)
Patient ID: Deborah Carey, female   DOB: [birth year], 70 y.o.   MRN: [id number]  SW faxed outpatient PT/OT/SLP order to [facility] ([phone]/f:[phone]).  Loralee Pacas, MSW, [place] Office: [phone] Cell: [phone] Fax: [phone]

## 2021-06-08 NOTE — Patient Care Conference (Signed)
Inpatient RehabilitationTeam Conference and Plan of Care Update Date: 06/08/2021   Time: 11:17 AM    Patient Name: Deborah Carey [facility]      Medical Record Number: [id number]  Date of Birth: 1950/09/08 Sex: Female         Room/Bed: 4W20C/4W20C-01 Payor Info: Payor: [employer] MEDICARE / Plan: [employer] MEDICARE CHOICE PPO / Product Type: *No Product type* /    Admit Date/Time:  06/01/2021  3:47 PM  Primary Diagnosis:  Acute ischemic left middle cerebral artery (MCA) stroke [facility])  Hospital Problems: Principal Problem:   Acute ischemic left middle cerebral artery (MCA) stroke ([place]) Active Problems:   Prediabetes   Atrial fibrillation ([place])   Cholelithiasis without cholecystitis   Mass in chest    Expected Discharge Date: Expected Discharge Date: 06/07/21  Team Members Present: Physician leading conference: Dr. Courtney Heys Social Worker Present: Erlene Quan, BSW Nurse Present: Dorthula Nettles, RN PT Present: Excell Seltzer, PT OT Present: Roanna Epley, [place], OT SLP Present: Charolett Bumpers, SLP PPS Coordinator present : Gunnar Fusi, SLP     Current Status/Progress Goal Weekly Team Focus  Bowel/Bladder   cont. b/b, lbm 10/1  remain continent      Swallow/Nutrition/ Hydration             ADL's   mod I/supervision overall  mod I/supervision overall  discharge planning and discharge home   Mobility   S to mod I  S to mod I  d/c planning   Communication   sueprvision-mod I, word finding  mod I  education completed recommending f/u OPST   Safety/Cognition/ Behavioral Observations  supervision-Mod I, recall  Mod I  education completed recommendng f/u OPST   Pain   no pain  < 3  assess pain q 4hr and prn   Skin   right groin wound  no new breakdown  assess skin q shift and prn     Discharge Planning:  Pt to d/c to home with assistance from husband, and friends/neighbors PRN. Fam edu completed on 9/30 9am-11am with husband.   Team Discussion: Patient had  slow processing, discharged 06/27/21. Continent B/B, LBM 06/05/21. No pain reported. Trazodone for sleep available. Right groin wound clean. Educated on skin/wound care and medications. All goals met. Supervision/mod I goals. Patient on target to meet rehab goals: yes  *See Care Plan and progress notes for long and short-term goals.   Revisions to Treatment Plan:  Discharged 06/07/21  Teaching Needs: All education completed.  Current Barriers to Discharge: Decreased caregiver support, Medical stability, Home enviroment access/layout, Wound care, Lack of/limited family support, Weight, and Medication compliance  Possible Resolutions to Barriers: Continued medications as directed, all barriers addressed, provided emotional support.     Medical Summary Current Status: slowed processing- B/L MCA and ACA stroke; conitnent B/B- LBM 10/1- no pain; trazodone prn for sleep; R gorin incision lookes good.  Barriers to Discharge: Behavior;Decreased family/caregiver support;Home enviroment access/layout;Medication compliance;Weight  Barriers to Discharge Comments: d/c 06/07/21 with family- Possible Resolutions to [employer]: at goal level when discharged yesterday 10/3- supervision to mod I-   Continued Need for Acute Rehabilitation Level of Care: The patient requires daily medical management by a physician with specialized training in physical medicine and rehabilitation for the following reasons: Direction of a multidisciplinary physical rehabilitation program to maximize functional independence : Yes Medical management of patient stability for increased activity during participation in an intensive rehabilitation regime.: Yes   I attest that I was present, lead the team  conference, and concur with the assessment and plan of the team.   Cristi Loron 06/08/2021, 12:00 PM

## 2021-06-11 ENCOUNTER — Ambulatory Visit: Payer: Medicare PPO | Attending: Family Medicine | Admitting: Speech Pathology

## 2021-06-11 ENCOUNTER — Encounter: Payer: Self-pay | Admitting: Speech Pathology

## 2021-06-11 ENCOUNTER — Other Ambulatory Visit: Payer: Self-pay

## 2021-06-11 DIAGNOSIS — R262 Difficulty in walking, not elsewhere classified: Secondary | ICD-10-CM | POA: Diagnosis not present

## 2021-06-11 DIAGNOSIS — R4701 Aphasia: Secondary | ICD-10-CM | POA: Insufficient documentation

## 2021-06-11 DIAGNOSIS — R269 Unspecified abnormalities of gait and mobility: Secondary | ICD-10-CM | POA: Insufficient documentation

## 2021-06-11 DIAGNOSIS — M6281 Muscle weakness (generalized): Secondary | ICD-10-CM | POA: Insufficient documentation

## 2021-06-11 DIAGNOSIS — R2681 Unsteadiness on feet: Secondary | ICD-10-CM | POA: Insufficient documentation

## 2021-06-11 DIAGNOSIS — I63512 Cerebral infarction due to unspecified occlusion or stenosis of left middle cerebral artery: Secondary | ICD-10-CM | POA: Insufficient documentation

## 2021-06-11 DIAGNOSIS — R41841 Cognitive communication deficit: Secondary | ICD-10-CM | POA: Insufficient documentation

## 2021-06-11 NOTE — Patient Instructions (Signed)
Start reading another book

## 2021-06-11 NOTE — Therapy (Signed)
[place] MAIN [facility] SERVICES [address] [place], [place], [address] Phone: [phone]   Fax:  [phone]  Speech Language Pathology Evaluation  Patient Details  Name: ANUSHRI CASALINO MRN: [id number] Date of Birth: 03-May-1951 Referring Provider (SLP): Reesa Chew   Encounter Date: 06/11/2021   End of Session - 06/11/21 1047     Visit Number 1    Number of Visits 25    Date for SLP Re-Evaluation 09/03/21    Authorization Type [employer]    Authorization Time Period 06/11/2021 thru 09/03/2021    Authorization - Visit Number 1    Progress Note Due on Visit 10    SLP Start Time 0810    SLP Stop Time  0905    SLP Time Calculation (min) 55 min    Activity Tolerance Patient tolerated treatment well             Past Medical History:  Diagnosis Date   Allergy    allergic rhinitis   Anxiety    Cataract    removed both eyes    Infertility, female    Osteoarthritis of both knees    OA    PONV (postoperative nausea and vomiting)    Post-menopausal bleeding    neg endo bx.    Past Surgical History:  Procedure Laterality Date   cataract surgery  2011   CESAREAN SECTION     times 2   COLONOSCOPY  2010   hems    ENDOMETRIAL BIOPSY     neg for CA cells   FACIAL COSMETIC SURGERY  2006   face lift   FOOT SURGERY  1998   rt heel spur removal   IR CT HEAD LTD  05/25/2021   IR PERCUTANEOUS ART THROMBECTOMY/INFUSION INTRACRANIAL INC DIAG ANGIO  05/25/2021   IR US GUIDE VASC ACCESS RIGHT  05/25/2021   RADIOLOGY WITH ANESTHESIA N/A 05/25/2021   Procedure: IR WITH ANESTHESIA;  Surgeon: Aletta Edouard, MD;  Location: [facility];  Service: Radiology;  Laterality: N/A;   REFRACTIVE SURGERY  08/1999    There were no vitals filed for this visit.   Subjective Assessment - 06/11/21 0912     Subjective pt pleasant, accompanied by her husband    Patient is accompained by: Family member    Currently in Pain? No/denies                 SLP Evaluation [facility] - 06/11/21 0916       SLP Visit Information   SLP Received On 06/11/21    Referring Provider (SLP) Reesa Chew    Onset Date 05/25/2021    Medical Diagnosis L MCA CVA      Subjective   Patient/Family Stated Goal to be able to say what I want to say      General Information   HPI 70 y.o. female with PMH of cataracts, anxiety, osteoarthritis who was at the cancer center with her husband when she started having right-sided weakness with left eye deviation and aphasia on 05/25/21. CTA with left M1/MCA occlusion and she was taken emergently to IR for mechanical thrombectomy which resulted in complete recanalization. Intubated 9/20 for procedure and extubated the same day. 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 DYs 3, NTL recommended with pt able to progress safely to regular diet with thin liquids.    Behavioral/Cognition appropriate    Mobility Status ambulatory      Balance Screen   Has the patient fallen in  the past 6 months No    Has the patient had a decrease in activity level because of a fear of falling?  No    Is the patient reluctant to leave their home because of a fear of falling?  No      Prior Functional Status   Cognitive/Linguistic Baseline Within functional limits    Type of Home House     Lives With Spouse    Vocation Retired      Pain Assessment   Pain Assessment No/denies pain      Cognition   Overall Cognitive Status Within Functional Limits for tasks assessed      Auditory Comprehension   Overall Auditory Comprehension Appears within functional limits for tasks assessed      Visual Recognition/Discrimination   Discrimination Within Function Limits      Reading Comprehension   Reading Status Not tested      Expression   Primary Mode of Expression Verbal      Verbal Expression   Overall Verbal Expression Impaired    Initiation No impairment    Automatic Speech Name;Social Response;Day of week;Month of year;Counting     Level of Generative/Spontaneous Verbalization Conversation    Repetition No impairment    Naming Impairment    Responsive 76-100% accurate    Confrontation 75-100% accurate    Convergent 75-100% accurate    Divergent 50-74% accurate    Pragmatics No impairment    Non-Verbal Means of Communication Not applicable      Written Expression   Dominant Hand Right    Written Expression Within Functional Limits      Oral Motor/Sensory Function   Overall Oral Motor/Sensory Function Appears within functional limits for tasks assessed      Motor Speech   Overall Motor Speech Appears within functional limits for tasks assessed    Motor Speech Errors Not applicable      Standardized Assessments   Standardized Assessments  --   COGNISTAT                            SLP Education - 06/11/21 1047     Education Details results of COGNISTAT, higher level/advanced word finding deficits, ST POC    Person(s) Educated Patient;Spouse    Methods Explanation;Demonstration;Verbal cues    Comprehension Verbalized understanding;Returned demonstration;Need further instruction              SLP Short Term Goals - 06/11/21 1056       SLP SHORT TERM GOAL #1   Title Pt will name at least 15 items in a named category independently with 100% accuracy.    Baseline 8 items    Time 10    Period --   sessions   Status New      SLP SHORT TERM GOAL #2   Title Patient will generate grammatical, fluent, and cogent sentences to complete abstract/complex linguistic task with 80% accuracy.    Baseline <80%    Time 10    Period --   sessions   Status New      SLP SHORT TERM GOAL #3   Title Pt will produce original, short sentences when given a target word with 85% accuracy and min verbal cues.    Baseline <75%    Time 10    Period --   sessions   Status New              SLP Long Term  Goals - 06/11/21 1055       SLP LONG TERM GOAL #1   Title Pt will use functional  communication skills for social interaction (e.g., greetings,  social etiquette, and short questions/simple sentences) with both familiar and unfamiliar  partners with 95% success.    Baseline < 80%    Time 12    Period Weeks    Status New    Target Date 09/03/21              Plan - 06/11/21 1048     Clinical Impression Statement Pt presents with cognitive abilities that are largely within the normal range. The COGNISTAT was administered with mild deficits (score=8) in memory d/t inability with delayed recall of 4 words. Pt's ability increased greatly with category cues. Pt reports difficulty with "remembering things" but unsure what role aphasia plays in memory vs word finding. Will continue diagnostic assessments given pt's higher level word finding deficits. Pt's conversational language is c/b halting speech that contains fillers ("uh") and semantic paraphasias. This is further evident during higher level divergent naming tasks. At this time, recommend skilled ST intervention target pt's mild expressive aphasia in an effort to increase pt's functional independence and reduce caregiver burden.    Speech Therapy Frequency 2x / week    Duration 12 weeks    Treatment/Interventions Language facilitation;Compensatory techniques;SLP instruction and feedback;Patient/family education;Compensatory strategies    Potential to Achieve Goals Good    Consulted and Agree with Plan of Care Patient;Family member/caregiver    Family Member Consulted pt's husband             Patient will benefit from skilled therapeutic intervention in order to improve the following deficits and impairments:   Aphasia  Cognitive communication deficit  Acute ischemic left middle cerebral artery (MCA) stroke ([place])    Problem List Patient Active Problem List   Diagnosis Date Noted   Cholelithiasis without cholecystitis 06/07/2021   Mass in chest 06/07/2021   Acute ischemic left middle cerebral artery (MCA)  stroke ([place]) 06/01/2021   Hypoxia    Cerebrovascular accident (CVA) ([place]) 05/25/2021   Acute respiratory insufficiency    Atrial fibrillation ([place])    Mass of breast, left 11/13/2020   Colon cancer screening 05/14/2019   Vitamin D deficiency 05/08/2018   Flushing 06/20/2017   H/O fracture of fibula 01/21/2014   Osteopenia 01/07/2014   Family history of osteoporosis 11/29/2013   Estrogen deficiency 11/29/2013   Hyperlipidemia 11/29/2013   Encounter for Medicare annual wellness exam 10/29/2013   Irregular heartbeat 10/07/2013   Left thyroid nodule 09/26/2013   Enlarged thyroid 09/16/2013   Primary osteoarthritis of both knees 11/05/2012   Other screening mammogram 09/27/2011   Routine general medical examination at a health care facility 09/19/2011   Obesity (BMI 35.0-39.9 without comorbidity) 06/06/2008   Prediabetes 06/06/2008   Anxiety state 04/17/2007   ALLERGIC RHINITIS 04/17/2007   Olar Santini B. Rutherford Nail M.S., CCC-SLP, [place] Pathologist Rehabilitation Services Office [phone]  Stormy Fabian 06/11/2021, 11:00 AM  [place] MAIN [facility] SERVICES [address] [place], [place], [address] Phone: [phone]   Fax:  [phone]  Name: ELEXIS POLLAK MRN: [id number] Date of Birth: [birth year]

## 2021-06-14 ENCOUNTER — Other Ambulatory Visit: Payer: Self-pay

## 2021-06-14 ENCOUNTER — Ambulatory Visit: Payer: Medicare PPO

## 2021-06-14 DIAGNOSIS — R2681 Unsteadiness on feet: Secondary | ICD-10-CM | POA: Diagnosis not present

## 2021-06-14 DIAGNOSIS — R4701 Aphasia: Secondary | ICD-10-CM | POA: Diagnosis not present

## 2021-06-14 DIAGNOSIS — M6281 Muscle weakness (generalized): Secondary | ICD-10-CM

## 2021-06-14 DIAGNOSIS — R262 Difficulty in walking, not elsewhere classified: Secondary | ICD-10-CM | POA: Diagnosis not present

## 2021-06-14 DIAGNOSIS — I63512 Cerebral infarction due to unspecified occlusion or stenosis of left middle cerebral artery: Secondary | ICD-10-CM | POA: Diagnosis not present

## 2021-06-14 DIAGNOSIS — R269 Unspecified abnormalities of gait and mobility: Secondary | ICD-10-CM | POA: Diagnosis not present

## 2021-06-14 DIAGNOSIS — R41841 Cognitive communication deficit: Secondary | ICD-10-CM | POA: Diagnosis not present

## 2021-06-15 NOTE — Therapy (Signed)
[place] MAIN [facility] SERVICES [address] [place], [place], [address] Phone: [phone]   Fax:  [phone]  Occupational Therapy Evaluation  Patient Details  Name: Deborah Carey MRN: [id number] Date of Birth: 10/20/1950 Referring Provider (OT): Dr. Loura Pardon   Encounter Date: 06/14/2021   OT End of Session - 06/15/21 0820     Visit Number 1    Number of Visits 1    OT Start Time 1000    OT Stop Time 1045    OT Time Calculation (min) 45 min    Activity Tolerance Patient tolerated treatment well    Behavior During Therapy [facility] for tasks assessed/performed             Past Medical History:  Diagnosis Date   Allergy    allergic rhinitis   Anxiety    Cataract    removed both eyes    Infertility, female    Osteoarthritis of both knees    OA    PONV (postoperative nausea and vomiting)    Post-menopausal bleeding    neg endo bx.    Past Surgical History:  Procedure Laterality Date   cataract surgery  2011   CESAREAN SECTION     times 2   COLONOSCOPY  2010   hems    ENDOMETRIAL BIOPSY     neg for CA cells   FACIAL COSMETIC SURGERY  2006   face lift   FOOT SURGERY  1998   rt heel spur removal   IR CT HEAD LTD  05/25/2021   IR PERCUTANEOUS ART THROMBECTOMY/INFUSION INTRACRANIAL INC DIAG ANGIO  05/25/2021   IR US GUIDE VASC ACCESS RIGHT  05/25/2021   RADIOLOGY WITH ANESTHESIA N/A 05/25/2021   Procedure: IR WITH ANESTHESIA;  Surgeon: Aletta Edouard, MD;  Location: [facility];  Service: Radiology;  Laterality: N/A;   REFRACTIVE SURGERY  08/1999    There were no vitals filed for this visit.   Subjective Assessment - 06/14/21 1549     Subjective  Pt reports no concerns since being home with spouse about a week after discharge from CIR.    Pertinent History L MCA    Patient Stated Goals Back to full indep.    Currently in Pain? No/denies    Pain Score 0-No pain    Multiple Pain Sites No               [facility] OT  Assessment - 06/14/21 1006       Assessment   Medical Diagnosis L MCA infarct    Referring Provider (OT) Dr. Loura Pardon    Onset Date/Surgical Date 05/25/21    Hand Dominance Right    Next MD Visit Returns to Dr. Dagoberto Ligas on 06/18/2021    Prior Therapy Acute and CIR      Precautions   Precautions None      Restrictions   Weight Bearing Restrictions No      Balance Screen   Has the patient fallen in the past 6 months No    Has the patient had a decrease in activity level because of a fear of falling?  No    Is the patient reluctant to leave their home because of a fear of falling?  No      Home  Environment   Family/patient expects to be discharged to: Private residence    Living Arrangements Spouse/significant other    Available Help at Discharge Family    Type of [place]  Home Access Stairs    Home Layout Two level    Alternate Level Stairs - Number of Steps 4 steps, no rail    Designer, multimedia - single point;Grab bars - toilet;Grab bars - tub/shower   pt/spouse report they have canes in the house but they are not being used     Prior Function   Level of Independence Independent    Vocation Retired    Biomedical scientist retired Automotive engineer      ADL   Eating/Feeding Independent    Grooming Independent    Administrator, Civil Service Status Independent    Mobility Status Comments walking without device      Written Expression   Dominant Hand Right    Handwriting 100% legible    Written Experience Within Functional Limits      Vision - History   Baseline Vision No visual deficits    Additional Comments Pt reports no changes from baseline      Vision  Assessment   Ocular Range of Motion Within Functional Limits    Alignment/Gaze Preference Within Defined Limits    Tracking/Visual Pursuits Able to track stimulus in all quads without difficulty    Saccades Within functional limits    Visual Fields No apparent deficits      Cognition   Overall Cognitive Status Within Functional Limits for tasks assessed      Observation/Other Assessments   Skin Integrity No concerns/intact    Focus on Therapeutic Outcomes (FOTO)  NT d/t eval only      Sensation   Light Touch Appears Intact      Coordination   Gross Motor Movements are Fluid and Coordinated Yes    Fine Motor Movements are Fluid and Coordinated Yes    Finger Nose Finger Test no deficits    Right 9 Hole Peg Test 21 sec    Left 9 Hole Peg Test 22 sec      Praxis   Praxis Intact      AROM   Overall AROM Comments R shoulder flexion 0-115, L 0-125      Strength   Overall Strength Comments BUEs grossly 4+/5      Hand Function   Right Hand Grip (lbs) 34    Right Hand Lateral Pinch 10 lbs    Right Hand 3 Point Pinch 8 lbs    Left Hand Grip (lbs) 36    Left Hand Lateral Pinch 12 lbs    Left 3 point pinch 10 lbs            Occupational Therapy Evaluation: Pt is a 70 y/o female who presents today post L MCA.  Pt resides with spouse in a 2 level home.  Prior to CVA, pt was indep with all ADLs/IADLs and mobility.  BUE proximal strength is good/equal; R GMC/FMC WNL; mild weakness in R hand as compared to non -dominant, but functionally pt is performing all self care independently and reports no difficulties with lifting or opening containers.  HEP given for R hand strengthening and pt shows indep to follow handout.  Also instructed in cane stretches to maximize bilat shoulder flexion with good  return demo.  No additional skilled OT indicated at this time.   Therapeutic Exercise: Pink theraputty issued with OT providing instruction in grip and pinch strengthening for R hand.  HEP  handout issued with good return demo from pt.      OT Education - 06/15/21 0810     Education Details OT role, HEP    Person(s) Educated Patient;Spouse    Methods Explanation;Demonstration;Verbal cues;Handout    Comprehension Verbalized understanding;Returned demonstration              OT Short Term Goals - 06/15/21 2725       OT SHORT TERM GOAL #1   Title Pt will be indep with HEP for increasing strength in R hand.    Baseline Eval: Pink theraputty issued at eval and pt was instructed in HEP for grip and pinch strengthening; pt shows indep with HEP handout.    Time 1    Period Days    Status Achieved    Target Date 06/14/21               Plan - 06/15/21 0835     Clinical Impression Statement Pt is a 70 y/o female who presents today post L MCA.  Pt resides with spouse in a 2 level home.  Prior to CVA, pt was indep with all ADLs/IADLs and mobility.  BUE proximal strength is good/equal; R GMC/FMC WNL; mild weakness in R hand as compared to non-dominant, but functionally pt is performing all self care independently and reports no difficulties with lifting or opening containers.  HEP given for R hand strengthening and pt shows indep to follow handout.  Also instructed in cane stretches to maximize bilat shoulder flexion with good return demo.  No additional skilled OT indicated at this time.    OT Occupational Profile and History Detailed Assessment- Review of Records and additional review of physical, cognitive, psychosocial history related to current functional performance    Occupational performance deficits (Please refer to evaluation for details): IADL's    Body Structure / Function / Physical Skills FMC;Strength    Rehab Potential Excellent    Clinical Decision Making Limited treatment options, no task modification necessary    Comorbidities Affecting Occupational Performance: May have comorbidities impacting occupational performance    Modification or Assistance to  Complete Evaluation  No modification of tasks or assist necessary to complete eval    OT Frequency One time visit    OT Treatment/Interventions Therapeutic exercise    Plan Eval only    OT Home Exercise Plan HEP issued for R grip and pinch strengthening (pink theraputty), and cane stretches for increasing bilat shoulder flexion    Consulted and Agree with Plan of Care Patient;Family member/caregiver    Family Member Consulted spouse             Patient will benefit from skilled therapeutic intervention in order to improve the following deficits and impairments:   Body Structure / Function / Physical Skills: [facility], Strength       Visit Diagnosis: Acute ischemic left middle cerebral artery (MCA) stroke ([facility])  Muscle weakness (generalized)    Problem List Patient Active Problem List   Diagnosis Date Noted   Cholelithiasis without cholecystitis 06/07/2021   Mass in chest 06/07/2021   Acute ischemic left middle cerebral artery (MCA) stroke ([place]) 06/01/2021   Hypoxia    Cerebrovascular accident (CVA) ([place]) 05/25/2021   Acute respiratory insufficiency    Atrial fibrillation ([facility])    Mass of breast,  left 11/13/2020   Colon cancer screening 05/14/2019   Vitamin D deficiency 05/08/2018   Flushing 06/20/2017   H/O fracture of fibula 01/21/2014   Osteopenia 01/07/2014   Family history of osteoporosis 11/29/2013   Estrogen deficiency 11/29/2013   Hyperlipidemia 11/29/2013   Encounter for Medicare annual wellness exam 10/29/2013   Irregular heartbeat 10/07/2013   Left thyroid nodule 09/26/2013   Enlarged thyroid 09/16/2013   Primary osteoarthritis of both knees 11/05/2012   Other screening mammogram 09/27/2011   Routine general medical examination at a health care facility 09/19/2011   Obesity (BMI 35.0-39.9 without comorbidity) 06/06/2008   Prediabetes 06/06/2008   Anxiety state 04/17/2007   ALLERGIC RHINITIS 04/17/2007   Leta Speller, MS, OTR/L  Darleene Cleaver,  OT/L 06/15/2021, 9:24 AM  [place] MAIN [facility] SERVICES [address] [place], [place], [address] Phone: [phone]   Fax:  [phone]  Name: Deborah Carey MRN: [id number] Date of Birth: [birth year]

## 2021-06-16 ENCOUNTER — Other Ambulatory Visit: Payer: Self-pay

## 2021-06-16 ENCOUNTER — Ambulatory Visit: Payer: Medicare PPO | Admitting: Physical Therapy

## 2021-06-16 ENCOUNTER — Encounter: Payer: Self-pay | Admitting: Physical Therapy

## 2021-06-16 DIAGNOSIS — R41841 Cognitive communication deficit: Secondary | ICD-10-CM | POA: Diagnosis not present

## 2021-06-16 DIAGNOSIS — R262 Difficulty in walking, not elsewhere classified: Secondary | ICD-10-CM | POA: Diagnosis not present

## 2021-06-16 DIAGNOSIS — R2681 Unsteadiness on feet: Secondary | ICD-10-CM | POA: Diagnosis not present

## 2021-06-16 DIAGNOSIS — M6281 Muscle weakness (generalized): Secondary | ICD-10-CM | POA: Diagnosis not present

## 2021-06-16 DIAGNOSIS — R4701 Aphasia: Secondary | ICD-10-CM | POA: Diagnosis not present

## 2021-06-16 DIAGNOSIS — R269 Unspecified abnormalities of gait and mobility: Secondary | ICD-10-CM | POA: Diagnosis not present

## 2021-06-16 DIAGNOSIS — I63512 Cerebral infarction due to unspecified occlusion or stenosis of left middle cerebral artery: Secondary | ICD-10-CM | POA: Diagnosis not present

## 2021-06-16 NOTE — Therapy (Signed)
[place] MAIN [facility] SERVICES [address] [place], [place], [address] Phone: [phone]   Fax:  [phone]  Physical Therapy Evaluation  Patient Details  Name: Deborah Carey MRN: [id number] Date of Birth: 1950/10/23 No data recorded  Encounter Date: 06/16/2021   PT End of Session - 06/16/21 1057     Visit Number 1    Number of Visits 13    Date for PT Re-Evaluation 07/28/21    Authorization Type [employer]    PT Start Time 0902    PT Stop Time 1005    PT Time Calculation (min) 63 min    Equipment Utilized During Treatment Gait belt    Activity Tolerance Patient tolerated treatment well    Behavior During Therapy WFL for tasks assessed/performed             Past Medical History:  Diagnosis Date   Allergy    allergic rhinitis   Anxiety    Cataract    removed both eyes    Infertility, female    Osteoarthritis of both knees    OA    PONV (postoperative nausea and vomiting)    Post-menopausal bleeding    neg endo bx.    Past Surgical History:  Procedure Laterality Date   cataract surgery  2011   CESAREAN SECTION     times 2   COLONOSCOPY  2010   hems    ENDOMETRIAL BIOPSY     neg for CA cells   FACIAL COSMETIC SURGERY  2006   face lift   FOOT SURGERY  1998   rt heel spur removal   IR CT HEAD LTD  05/25/2021   IR PERCUTANEOUS ART THROMBECTOMY/INFUSION INTRACRANIAL INC DIAG ANGIO  05/25/2021   IR US GUIDE VASC ACCESS RIGHT  05/25/2021   RADIOLOGY WITH ANESTHESIA N/A 05/25/2021   Procedure: IR WITH ANESTHESIA;  Surgeon: Aletta Edouard, MD;  Location: [facility];  Service: Radiology;  Laterality: N/A;   REFRACTIVE SURGERY  08/1999    There were no vitals filed for this visit.    Subjective Assessment - 06/16/21 0902     Subjective " I feel like I'm doing about as good as I was before the stroke. I just get tired more often."    Patient is accompained by: Family member   [name]   Pertinent History 70 yo Female s/p  CVA on 05/25/21; She was admitted to hospital for approximately 1 week and then discharged to inpatient rehab for 1 week. She was discharged home in October 2022. She reports since being home she has been doing well. She presents to therapy without assistive device. She denies using any assistive device at home. Denies any recent falls. She denies any stumbles or unsteadiness. She reports intermittent fatigue requiring rest at times. Pt reports she is not cooking at this time. She also reports she is not cleaning much. She reports trying some activities but fatigues quickly. She reports she does sleep well at night.She denies any numbness/tingling;    Limitations Standing;Walking    How long can you sit comfortably? NA    How long can you stand comfortably? 10-15 min;    How long can you walk comfortably? approximately 10 min, >500 feet; Does fatigue with long distance walking    Diagnostic tests MRI on 05/26/21: Acute infarction of the left putamen and caudate. Minimal petechial  blood products but no frank hematoma. Mild swelling but no midline  shift.  Three punctate acute infarctions within the medial right frontal  lobe, probably micro embolic infarctions in the anterior cerebral  artery territory.     Old infarction in the right parietal cortical and subcortical brain  and seen affecting the cerebral hemispheric deep white matter  extensively.    Patient Stated Goals improve energy; be able to walk better- faster and longer;    Currently in Pain? No/denies    Multiple Pain Sites No                [facility] PT Assessment - 06/16/21 0001       Assessment   Medical Diagnosis L MCA infarct    Onset Date/Surgical Date 05/25/21    Hand Dominance Right    Next MD Visit Returns to Dr. Dagoberto Ligas on 06/18/2021; also follows up with Dr. Glori Bickers (PCP)    Prior Therapy Acute and CIR      Precautions   Precautions None    Required Braces or Orthoses --   none     Restrictions   Weight Bearing  Restrictions No      Balance Screen   Has the patient fallen in the past 6 months No    Has the patient had a decrease in activity level because of a fear of falling?  Yes    Is the patient reluctant to leave their home because of a fear of falling?  No      Home Ecologist residence    Living Arrangements Spouse/significant other    Available Help at Discharge Family    Type of [place] to enter    Entrance Stairs-Number of Steps 4    Entrance Stairs-Rails Right;Left;Cannot reach both   Negotiates one step at a time;   Home Layout One level    [place] None      Prior Function   Level of Independence Independent    Vocation Retired    Biomedical scientist retired Automotive engineer      Cognition   Overall Cognitive Status Within Functional Limits for tasks assessed      Observation/Other Assessments   Skin Integrity No concerns/intact    Cranial Nerve(s) Grossly intact    Focus on Therapeutic Outcomes (FOTO)  82%      Sensation   Light Touch Appears Intact      Coordination   Gross Motor Movements are Fluid and Coordinated Yes    Fine Motor Movements are Fluid and Coordinated Yes    Heel Shin Test accurate bilaterally;      Posture/Postural Control   Posture Comments WFL      ROM / Strength   AROM / PROM / Strength Strength      AROM   Overall AROM Comments BLE are [facility]      Strength   Strength Assessment Site Hip;Ankle    Right/Left Hip Right;Left    Right Hip Flexion 4/5    Right Hip ABduction 5/5    Right Hip ADduction 5/5    Left Hip Flexion 5/5    Left Hip ABduction 5/5    Left Hip ADduction 5/5    Right Knee Flexion 5/5    Right Knee Extension 5/5    Left Knee Flexion 5/5    Left Knee Extension 5/5    Right/Left Ankle Right;Left    Right Ankle Dorsiflexion 5/5    Right Ankle Plantar Flexion 3-/5    Left Ankle  Dorsiflexion 5/5    Left Ankle Plantar Flexion 3+/5      Transfers    Comments able to transfer sit<>Stand without pushing on chair, does report increased knee discomfort; requires increased time;      Ambulation/Gait   Gait Comments able to ambulate on level surface with reciprocal gait pattern, normal base of support; does exhibit mild right foot drag with mild antalgic gait with prolonged ambulation; Pt reports an old right foot injury (>20 years) which has been affecting her gait; walks with slower gait speed      6 Minute Walk- Baseline   6 Minute Walk- Baseline yes    BP (mmHg) 119/61    HR (bpm) 67    02 Sat (%RA) 97 %      6 Minute walk- Post Test   6 Minute Walk Post Test yes    BP (mmHg) 120/69    HR (bpm) 139    02 Sat (%RA) 99 %    Modified Borg Scale for Dyspnea 3- Moderate shortness of breath or breathing difficulty    Perceived Rate of Exertion (Borg) 13- Somewhat hard      6 minute walk test results    Aerobic Endurance Distance Walked 1100    Endurance additional comments without AD, does exhibit increased right foot drag and reports increased right foot pain secondary to hold foot injury;      Standardized Balance Assessment   Standardized Balance Assessment Five Times Sit to Stand;10 meter walk test    Five times sit to stand comments  21.62 with arms across chest (>15 sec indicates high risk for falls) does report intermittent knee pain;    10 Meter Walk 0.9 m/s without AD, limited community ambulator      High Level Balance   High Level Balance Comments unable to hold SLS; modified tandem stance 30 sec each LE with mild instability noted with right foot ahead of left foot;      Functional Gait  Assessment   Gait assessed  Yes    Gait Level Surface Walks 20 ft in less than 7 sec but greater than 5.5 sec, uses assistive device, slower speed, mild gait deviations, or deviates 6-10 in outside of the 12 in walkway width.    Change in Gait Speed Able to smoothly change walking speed without loss of balance or gait deviation. Deviate  no more than 6 in outside of the 12 in walkway width.    Gait with Horizontal Head Turns Performs head turns smoothly with no change in gait. Deviates no more than 6 in outside 12 in walkway width    Gait with Vertical Head Turns Performs head turns with no change in gait. Deviates no more than 6 in outside 12 in walkway width.    Gait and Pivot Turn Pivot turns safely in greater than 3 sec and stops with no loss of balance, or pivot turns safely within 3 sec and stops with mild imbalance, requires small steps to catch balance.    Step Over Obstacle Is able to step over one shoe box (4.5 in total height) but must slow down and adjust steps to clear box safely. May require verbal cueing.    Gait with Narrow Base of Support Ambulates less than 4 steps heel to toe or cannot perform without assistance.    Gait with Eyes Closed Walks 20 ft, slow speed, abnormal gait pattern, evidence for imbalance, deviates 10-15 in outside 12 in walkway width. Requires more than  9 sec to ambulate 20 ft.    Ambulating Backwards Walks 20 ft, slow speed, abnormal gait pattern, evidence for imbalance, deviates 10-15 in outside 12 in walkway width.    Steps Alternating feet, must use rail.    Total Score 18    FGA comment: <22 indicates high risk for falls                        Objective measurements completed on examination: See above findings.                PT Education - 06/16/21 1032     Education Details plan of care/recommendations    Person(s) Educated Patient    Methods Explanation    Comprehension Verbalized understanding              PT Short Term Goals - 06/16/21 1053       PT SHORT TERM GOAL #1   Title Patient will be adherent to HEP at least 3x a week to improve functional strength and balance for better safety at home.    Time 4    Period Weeks    Status New    Target Date 07/14/21               PT Long Term Goals - 06/16/21 1054       PT LONG TERM  GOAL #1   Title Patient (> 31 years old) will complete five times sit to stand test in < 15 seconds indicating an increased LE strength and improved balance.    Time 6    Period Weeks    Status New    Target Date 07/28/21      PT LONG TERM GOAL #2   Title Patient will increase RLE gross strength to 4+/5 as to improve functional strength for independent gait, increased standing tolerance and increased ADL ability.    Time 6    Period Weeks    Status New    Target Date 07/28/21      PT LONG TERM GOAL #3   Title Patient will increase six minute walk test distance to >1300 for progression to community ambulator and improve gait ability closer to age group norms of 1644 feet;    Time 6    Period Weeks    Status New    Target Date 07/28/21      PT LONG TERM GOAL #4   Title Patient will ascend/descend 4 stairs without rail assist independently without loss of balance to improve ability to get in/out of home while carrying something.    Time 6    Period Weeks    Status New    Target Date 07/28/21      PT LONG TERM GOAL #5   Title Patient will improve FOTO score by 5% to indicate improved functional mobility with ADLs.    Time 6    Period Weeks    Status New    Target Date 07/28/21      Additional Long Term Goals   Additional Long Term Goals Yes      PT LONG TERM GOAL #6   Title Patient will increase Functional Gait Assessment score to >22/30 as to reduce fall risk and improve dynamic gait safety with community ambulation.    Time 6    Period Weeks    Status New    Target Date 07/28/21  Plan - 06/16/21 1032     Clinical Impression Statement 70 yo Female s/p CVA on 05/25/21. Patient does exhibit mild weakness on RLE.She presents to therapy without assistive device. She is able to ambulate on level surface without AD with good reciprocal gait pattern. However with prolonged ambulation, she does exhibit increased right foot drag and antalgic gait.  Patient reports history of right foot pain for >20 years which has affected her walking. She does test as an increased risk for falls as evidenced with functional gait assessment. She had increased difficulty with high level tasks such as backwards walking, forward walking with eyes closed. Despite risk for falls, patient denies any recent history of falls. Patient does live in a single story home but has steps to enter/exit. She does require rail assist when negotiating steps. She lives with her husband who has been helping with cooking/cleaning. Patient reports fatigue is her biggest limiting factor. She is currently considered a community ambulator per 6 min walk test, however is below age group norms of 1644 feet; Patient would benefit from skilled PT Intervention to improve LE strength, balance and overall mobility;She is currently working with speech therapy to address mild aphasia deficits.    Personal Factors and Comorbidities Age;Comorbidity 3+    Comorbidities history of chronic right foot pain, knee OA, hypercholesterolemia, abnormal heart rhythm (on blood thinners), osteopenia,    Examination-Activity Limitations Caring for Others;Squat;Stairs;Stand;Transfers    Examination-Participation Restrictions Cleaning;Community Activity;Shop;Volunteer    Stability/Clinical Decision Making Stable/Uncomplicated    Clinical Decision Making Low    Rehab Potential Good    PT Frequency 2x / week    PT Duration 6 weeks    PT Treatment/Interventions Cryotherapy;Moist Heat;Electrical Stimulation;Gait training;Stair training;Functional mobility training;Therapeutic activities;Therapeutic exercise;Balance training;Neuromuscular re-education;Patient/family education;Energy conservation    PT Next Visit Plan initiate HEP- work on RLE strengthening (watch out for right foot pain and chronic knee pain), address high level balance deficits    PT Home Exercise Plan will address next session    Recommended Other  Services getting speech therapy for mild aphasia deficits;    Consulted and Agree with Plan of Care Patient             Patient will benefit from skilled therapeutic intervention in order to improve the following deficits and impairments:  Abnormal gait, Decreased balance, Decreased endurance, Decreased mobility, Difficulty walking, Cardiopulmonary status limiting activity, Decreased strength, Decreased safety awareness, Decreased activity tolerance  Visit Diagnosis: Muscle weakness (generalized)  Unsteadiness on feet  Difficulty in walking, not elsewhere classified     Problem List Patient Active Problem List   Diagnosis Date Noted   Cholelithiasis without cholecystitis 06/07/2021   Mass in chest 06/07/2021   Acute ischemic left middle cerebral artery (MCA) stroke ([place]) 06/01/2021   Hypoxia    Cerebrovascular accident (CVA) ([place]) 05/25/2021   Acute respiratory insufficiency    Atrial fibrillation ([place])    Mass of breast, left 11/13/2020   Colon cancer screening 05/14/2019   Vitamin D deficiency 05/08/2018   Flushing 06/20/2017   H/O fracture of fibula 01/21/2014   Osteopenia 01/07/2014   Family history of osteoporosis 11/29/2013   Estrogen deficiency 11/29/2013   Hyperlipidemia 11/29/2013   Encounter for Medicare annual wellness exam 10/29/2013   Irregular heartbeat 10/07/2013   Left thyroid nodule 09/26/2013   Enlarged thyroid 09/16/2013   Primary osteoarthritis of both knees 11/05/2012   Other screening mammogram 09/27/2011   Routine general medical examination at a health care facility 09/19/2011  Obesity (BMI 35.0-39.9 without comorbidity) 06/06/2008   Prediabetes 06/06/2008   Anxiety state 04/17/2007   ALLERGIC RHINITIS 04/17/2007    Oree Hislop, PT, DPT 06/16/2021, 10:57 AM  [place] MAIN [facility] SERVICES [address] [place], [place], [address] Phone: [phone]   Fax:  [phone]  Name: Deborah Carey MRN: [id number] Date of Birth: Sep 02, 1951

## 2021-06-17 ENCOUNTER — Encounter: Payer: Self-pay | Admitting: Family Medicine

## 2021-06-17 ENCOUNTER — Ambulatory Visit: Payer: Medicare PPO | Admitting: Family Medicine

## 2021-06-17 VITALS — BP 126/74 | HR 80 | Temp 97.6°F | Ht 64.0 in | Wt 203.0 lb

## 2021-06-17 DIAGNOSIS — E6609 Other obesity due to excess calories: Secondary | ICD-10-CM | POA: Diagnosis not present

## 2021-06-17 DIAGNOSIS — Z8673 Personal history of transient ischemic attack (TIA), and cerebral infarction without residual deficits: Secondary | ICD-10-CM

## 2021-06-17 DIAGNOSIS — I4819 Other persistent atrial fibrillation: Secondary | ICD-10-CM

## 2021-06-17 DIAGNOSIS — R7303 Prediabetes: Secondary | ICD-10-CM | POA: Diagnosis not present

## 2021-06-17 DIAGNOSIS — Z6834 Body mass index (BMI) 34.0-34.9, adult: Secondary | ICD-10-CM | POA: Diagnosis not present

## 2021-06-17 DIAGNOSIS — E7800 Pure hypercholesterolemia, unspecified: Secondary | ICD-10-CM | POA: Diagnosis not present

## 2021-06-17 DIAGNOSIS — K8020 Calculus of gallbladder without cholecystitis without obstruction: Secondary | ICD-10-CM | POA: Diagnosis not present

## 2021-06-17 DIAGNOSIS — F411 Generalized anxiety disorder: Secondary | ICD-10-CM

## 2021-06-17 DIAGNOSIS — R911 Solitary pulmonary nodule: Secondary | ICD-10-CM

## 2021-06-17 MED ORDER — ATORVASTATIN CALCIUM 40 MG PO TABS: 40.0000 mg | ORAL_TABLET | Freq: Every day | ORAL | 1 refills | Status: DC

## 2021-06-17 MED ORDER — APIXABAN 5 MG PO TABS: 5.0000 mg | ORAL_TABLET | Freq: Two times a day (BID) | ORAL | 1 refills | Status: DC

## 2021-06-17 MED ORDER — PANTOPRAZOLE SODIUM 40 MG PO TBEC: 40.0000 mg | DELAYED_RELEASE_TABLET | Freq: Every day | ORAL | 1 refills | Status: DC

## 2021-06-17 MED ORDER — METOPROLOL SUCCINATE ER 25 MG PO TB24: 25.0000 mg | ORAL_TABLET | Freq: Every day | ORAL | 1 refills | Status: DC

## 2021-06-17 NOTE — Assessment & Plan Note (Signed)
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

## 2021-06-17 NOTE — Assessment & Plan Note (Signed)
Found incidentally on CT of head and neck 1 cm soft tissue nodule-recommend outpt f/u  Repeat CT/PET-CT or tissue sampling  No h/o smoking or chemical exposure  Disc options and choose pulmonary ref to review scan and make recommendation  Referral done

## 2021-06-17 NOTE — Assessment & Plan Note (Signed)
In setting of recent CVA-goal to manage aggressively  Disc goals for lipids and reasons to control them Rev last labs with pt Rev low sat fat diet in detail  Atorvastatin started in hospital 40 mg daily  Will plan lipid re check about a mo at [facility] office  Well tolerated so far

## 2021-06-17 NOTE — Patient Instructions (Signed)
I will place referrals for  Neurology  Pulmonary (to discuss the nodule)   Continue current medicines and therapy   If any trouble swallowing let me know   Schedule cholesterol labs at [facility] in about a month

## 2021-06-17 NOTE — Progress Notes (Signed)
Subjective:    Patient ID: Deborah Carey, female    DOB: 1951/04/24, 70 y.o.   MRN: [id number]  This visit occurred during the SARS-CoV-2 public health emergency.  Safety protocols were in place, including screening questions prior to the visit, additional usage of staff PPE, and extensive cleaning of exam room while observing appropriate contact time as indicated for disinfecting solutions.   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   Is tired  Processing takes a little more time  R side is a little weaker   Does not remember much  Was taking a friend for a treatment-sat down in a seat and lost consciousness    Hosp 9/20 to 9/27  Had TNK and mechanical thrombectomy with TICI3 revascularization  Now mild residual aphasia  On MRA her MCA is patent now  Planning f/u [facility]   A fib is rate controlled and bp stable  Now on eliquis   Hyperlipidemia  Taking lipitor 40 mg  LDL 109 with goal of under 70  Incidental noted R lung 1 cm soft tissue nodule Other Active Problems CT head and neck showed right lung apex 1cm soft tissue nodule - need outpatient follow-up  Per Radiology Consider one of the following in 3 months for both low-risk and high-risk individuals: (a) repeat chest CT, (b) follow-up PET-CT, or (c) tissue sampling.  She has no chemical exposure No smoking    Was in rehab for PT and OT and speech Tx from 9/27 to 10/3 Swallowing study improved  Is mild aspiration risk  Recommendation :  Cup;Straw  Medication Administration Whole meds with puree  Compensations Small sips/bites;Slow rate;Minimize environmental distractions  Postural Changes Seated upright at 90 degrees    Last labs Lab Results  Component Value Date   CREATININE  0.73 06/07/2021   BUN 17 06/07/2021   NA 139 06/07/2021   K 4.1 06/07/2021   CL 107 06/07/2021   CO2 23 06/07/2021   Lab Results  Component Value Date   ALT 15 06/02/2021   AST 16 06/02/2021   ALKPHOS 53 06/02/2021   BILITOT 1.0 06/02/2021   Lab Results  Component Value Date   WBC 10.7 (H) 06/07/2021   HGB 12.0 06/07/2021   HCT 35.3 (L) 06/07/2021   MCV 93.1 06/07/2021   PLT 238 06/07/2021   Lab Results  Component Value Date   HGBA1C 5.8 (H) 05/26/2021   Lab Results  Component Value Date   TSH 4.101 05/26/2021   Emotionally - doing ok  Has had some anxiety  Husband is helpful   A little pain in her neck  Other than that- not any   Not sleeping well  Could not go to sleep last night   Has not changed diet  She eats about the same   No stomach ulcers  Had gerd pre stroke  Cannot tell -palpitations   Echo was good   Does not have a neuro follow up appt   BP Readings from Last 3 Encounters:  06/17/21 126/74  06/07/21 112/68  06/01/21 127/74   Pulse Readings from Last 3 Encounters:  06/17/21 80  06/07/21 62  06/01/21 61   Patient Active Problem List  Diagnosis Date Noted   Cholelithiasis without cholecystitis 06/07/2021   Nodule of apex of right lung 06/07/2021   History of CVA (cerebrovascular accident) 05/25/2021   Atrial fibrillation ([place])    Mass of breast, left 11/13/2020   Colon cancer screening 05/14/2019   Vitamin D deficiency 05/08/2018   Flushing 06/20/2017   H/O fracture of fibula 01/21/2014   Osteopenia 01/07/2014   Family history of osteoporosis 11/29/2013   Estrogen deficiency 11/29/2013   Hyperlipidemia 11/29/2013   Encounter for Medicare annual wellness exam 10/29/2013   Irregular heartbeat 10/07/2013   Left thyroid nodule 09/26/2013   Enlarged thyroid 09/16/2013   Primary osteoarthritis of both knees 11/05/2012   Other screening mammogram 09/27/2011   Routine general medical examination at a health care facility  09/19/2011   Class 1 obesity due to excess calories with serious comorbidity and body mass index (BMI) of 34.0 to 34.9 in adult 06/06/2008   Prediabetes 06/06/2008   Anxiety state 04/17/2007   ALLERGIC RHINITIS 04/17/2007   Past Medical History:  Diagnosis Date   Allergy    allergic rhinitis   Anxiety    Cataract    removed both eyes    Infertility, female    Osteoarthritis of both knees    OA    PONV (postoperative nausea and vomiting)    Post-menopausal bleeding    neg endo bx.   Past Surgical History:  Procedure Laterality Date   cataract surgery  2011   CESAREAN SECTION     times 2   COLONOSCOPY  2010   hems    ENDOMETRIAL BIOPSY     neg for CA cells   FACIAL COSMETIC SURGERY  2006   face lift   FOOT SURGERY  1998   rt heel spur removal   IR CT HEAD LTD  05/25/2021   IR PERCUTANEOUS ART THROMBECTOMY/INFUSION INTRACRANIAL INC DIAG ANGIO  05/25/2021   IR US GUIDE VASC ACCESS RIGHT  05/25/2021   RADIOLOGY WITH ANESTHESIA N/A 05/25/2021   Procedure: IR WITH ANESTHESIA;  Surgeon: Aletta Edouard, MD;  Location: [facility];  Service: Radiology;  Laterality: N/A;   REFRACTIVE SURGERY  08/1999   Social History   Tobacco Use   Smoking status: Never   Smokeless tobacco: Never  Vaping Use   Vaping Use: Never used  Substance Use Topics   Alcohol use: No    Alcohol/week: 0.0 standard drinks   Drug use: No   Family History  Problem Relation Age of Onset   COPD Brother    Stroke Mother    Hypertension Mother    Osteoporosis Mother    Alzheimer's disease Father    Hypertension Father    Osteoporosis Father    Depression Sister    Hypertension Sister    Osteoporosis Sister    Thyroid disease Sister    Hypothyroidism Sister    Parkinson's disease Sister    Osteoporosis Sister    Hypothyroidism Sister    Breast cancer Neg Hx    Colon polyps Neg Hx    Colon cancer Neg Hx    Esophageal cancer Neg Hx    Rectal cancer Neg Hx    Stomach cancer Neg Hx    No Known  Allergies Current Outpatient Medications on File Prior to Visit  Medication Sig Dispense Refill   acetaminophen (TYLENOL) 325 MG tablet Take 1-2 tablets (325-650 mg total) by mouth every 4 (four) hours as needed for mild pain.     senna-docusate (SENOKOT-S) 8.6-50 MG tablet Take  2 tablets by mouth at bedtime as needed for mild constipation. 30 tablet 0   No current facility-administered medications on file prior to visit.    Review of Systems  Constitutional:  Positive for fatigue. Negative for activity change, appetite change, fever and unexpected weight change.  HENT:  Negative for congestion, ear pain, rhinorrhea, sinus pressure, sore throat, trouble swallowing and voice change.        Swallowing is good now No choking  Mild R facial droop  Eyes:  Negative for pain, redness and visual disturbance.  Respiratory:  Negative for cough, shortness of breath and wheezing.   Cardiovascular:  Negative for chest pain and palpitations.  Gastrointestinal:  Negative for abdominal pain, blood in stool, constipation and diarrhea.  Endocrine: Negative for polydipsia and polyuria.  Genitourinary:  Negative for dysuria, frequency and urgency.  Musculoskeletal:  Negative for arthralgias, back pain and myalgias.  Skin:  Negative for pallor and rash.  Allergic/Immunologic: Negative for environmental allergies.  Neurological:  Positive for facial asymmetry and speech difficulty. Negative for dizziness, tremors, seizures, syncope, light-headedness, numbness and headaches.       R hand is more clumsy  Hematological:  Negative for adenopathy. Does not bruise/bleed easily.  Psychiatric/Behavioral:  Negative for decreased concentration and dysphoric mood. The patient is not nervous/anxious.       Objective:   Physical Exam Constitutional:      General: She is not in acute distress.    Appearance: Normal appearance. She is well-developed. She is obese. She is not ill-appearing or diaphoretic.  HENT:      Head: Normocephalic and atraumatic.     Comments: Mild R sided facial droop (nasolabial fold is not as prominent) Eyes:     General: No scleral icterus.    Conjunctiva/sclera: Conjunctivae normal.     Pupils: Pupils are equal, round, and reactive to light.  Neck:     Thyroid: No thyromegaly.     Vascular: No carotid bruit or JVD.  Cardiovascular:     Rate and Rhythm: Normal rate. Rhythm irregular.     Heart sounds: Normal heart sounds.    No gallop.  Pulmonary:     Effort: Pulmonary effort is normal. No respiratory distress.     Breath sounds: Normal breath sounds. No wheezing or rales.  Abdominal:     General: Bowel sounds are normal. There is no distension or abdominal bruit.     Palpations: Abdomen is soft. There is no mass.     Tenderness: There is no abdominal tenderness.  Musculoskeletal:        General: No swelling or tenderness.     Cervical back: Normal range of motion and neck supple.     Right lower leg: No edema.     Left lower leg: No edema.  Lymphadenopathy:     Cervical: No cervical adenopathy.  Skin:    General: Skin is warm and dry.     Coloration: Skin is not jaundiced or pale.     Findings: No bruising, erythema or rash.  Neurological:     Mental Status: She is alert.     Motor: Weakness present.     Coordination: Coordination normal.     Deep Tendon Reflexes: Reflexes are normal and symmetric. Reflexes normal.     Comments: Mild R facial droop     Psychiatric:        Attention and Perception: Attention normal.        Mood and Affect: Mood normal.  Behavior: Behavior normal.        Thought Content: Thought content normal.        Cognition and Memory: Cognition and memory normal.     Comments: Pleasant   Occ word finding is mild          Assessment & Plan:   Problem List Items Addressed This Visit       Cardiovascular and Mediastinum   Atrial fibrillation ([place])    Recently diagnosed in setting of L MCA stroke /thought to be the  cause  Reviewed hospital records, lab results and studies in detail  Rate controlled with metoprolol xl 25 mg daily  Echocardiogram is reassuring  Taking eliquis for anticoagulation  Pt thinks she has cardiology visit planned (will have to double check this)      Relevant Medications   metoprolol succinate (TOPROL-XL) 25 MG 24 hr tablet   atorvastatin (LIPITOR) 40 MG tablet   apixaban (ELIQUIS) 5 MG TABS tablet     Digestive   Cholelithiasis without cholecystitis    Noted on CT  No symptoms         Other   Class 1 obesity due to excess calories with serious comorbidity and body mass index (BMI) of 34.0 to 34.9 in adult    Enc healthy diet and exercise as she recovers from cva       Anxiety state    Doing well despite recent MCA stroke Will monitor      Prediabetes    Lab Results  Component Value Date   HGBA1C 5.8 (H) 05/26/2021  disc imp of low glycemic diet and wt loss to prevent DM2       Hyperlipidemia    In setting of recent CVA-goal to manage aggressively  Disc goals for lipids and reasons to control them Rev last labs with pt Rev low sat fat diet in detail  Atorvastatin started in hospital 40 mg daily  Will plan lipid re check about a mo at [facility] office  Well tolerated so far      Relevant Medications   metoprolol succinate (TOPROL-XL) 25 MG 24 hr tablet   atorvastatin (LIPITOR) 40 MG tablet   apixaban (ELIQUIS) 5 MG TABS tablet   History of CVA (cerebrovascular accident) - Primary    L MCA thought to be caused by afib Reviewed hospital records, lab results and studies in detail   S/p thrombectomy and revascularization  In PT/OT/speech tx and recent swallowing study improved Mild residual aphasia and R facial droop/continues to improve  Fair balance/no falls  Taking eliquis  Will need to plan f/u with [facility] stroke clinic Plan aggressive risk factor modification  Now taking atorvastatin 40 mg daily (will plan lipids in about a month) Enc good  diet  bp is well controlled Ref done to neuro      Relevant Orders   Ambulatory referral to Neurology   Nodule of apex of right lung    Found incidentally on CT of head and neck 1 cm soft tissue nodule-recommend outpt f/u  Repeat CT/PET-CT or tissue sampling  No h/o smoking or chemical exposure  Disc options and choose pulmonary ref to review scan and make recommendation  Referral done       Relevant Orders   Ambulatory referral to Pulmonology

## 2021-06-17 NOTE — Assessment & Plan Note (Signed)
Lab Results  Component Value Date   HGBA1C 5.8 (H) 05/26/2021   disc imp of low glycemic diet and wt loss to prevent DM2

## 2021-06-17 NOTE — Assessment & Plan Note (Signed)
Enc healthy diet and exercise as she recovers from cva

## 2021-06-17 NOTE — Assessment & Plan Note (Signed)
Recently diagnosed in setting of L MCA stroke /thought to be the cause  Reviewed hospital records, lab results and studies in detail  Rate controlled with metoprolol xl 25 mg daily  Echocardiogram is reassuring  Taking eliquis for anticoagulation  Pt thinks she has cardiology visit planned (will have to double check this)

## 2021-06-17 NOTE — Assessment & Plan Note (Signed)
Doing well despite recent MCA stroke Will monitor

## 2021-06-17 NOTE — Assessment & Plan Note (Signed)
Noted on CT  No symptoms

## 2021-06-18 ENCOUNTER — Encounter: Payer: Self-pay | Admitting: Physical Medicine and Rehabilitation

## 2021-06-18 ENCOUNTER — Other Ambulatory Visit: Payer: Self-pay

## 2021-06-18 ENCOUNTER — Encounter: Payer: Medicare PPO | Attending: Physical Medicine and Rehabilitation | Admitting: Physical Medicine and Rehabilitation

## 2021-06-18 VITALS — BP 137/91 | HR 64 | Temp 98.0°F | Ht 64.0 in | Wt 202.4 lb

## 2021-06-18 DIAGNOSIS — I6930 Unspecified sequelae of cerebral infarction: Secondary | ICD-10-CM | POA: Diagnosis not present

## 2021-06-18 NOTE — Progress Notes (Signed)
Subjective:    Patient ID: Deborah Carey, female    DOB: 26-Sep-1950, 70 y.o.   MRN: [id number]  HPI  Pt is a 70 yr old female with recent B/L MCA stroke and R ACA stroke and mild R hemiparesis and mild delayed processing as well as Afib, dysphagia which has resolved, and here for hospital f/u.   Not using any assistive device.  Everything very good.   Cannot think of any residual deficit.  Used to speak up really fast per her husband- so he also notices she has slightly delayed processing.  To work on that at [employer].   Outpt SLP- also going to OT as well- finished PT already.  2x/week.   No pain; little weakness still on R side.      Pain Inventory Average Pain 0 Pain Right Now 0 My pain is  no pain  LOCATION OF PAIN  no pain  BOWEL Number of stools per week: 7 Oral laxative use No  Type of laxative na Enema or suppository use No  History of colostomy No  Incontinent No   BLADDER Normal In and out cath, frequency na Able to self cath  na Bladder incontinence No  Frequent urination No  Leakage with coughing No  Difficulty starting stream No  Incomplete bladder emptying No    Mobility walk without assistance do you drive?  no  Function retired  Neuro/Psych No problems in this area  Prior Studies Any changes since last visit?  no  Physicians involved in your care Any changes since last visit?  no   Family History  Problem Relation Age of Onset   COPD Brother    Stroke Mother    Hypertension Mother    Osteoporosis Mother    Alzheimer's disease Father    Hypertension Father    Osteoporosis Father    Depression Sister    Hypertension Sister    Osteoporosis Sister    Thyroid disease Sister    Hypothyroidism Sister    Parkinson's disease Sister    Osteoporosis Sister    Hypothyroidism Sister    Breast cancer Neg Hx    Colon polyps Neg Hx    Colon cancer Neg Hx    Esophageal cancer Neg Hx    Rectal cancer Neg Hx    Stomach cancer  Neg Hx    Social History   Socioeconomic History   Marital status: Married    Spouse name: Not on file   Number of children: Not on file   Years of education: Not on file   Highest education level: Not on file  Occupational History   Occupation: retired  Tobacco Use   Smoking status: Never   Smokeless tobacco: Never  Vaping Use   Vaping Use: Never used  Substance and Sexual Activity   Alcohol use: No    Alcohol/week: 0.0 standard drinks   Drug use: No   Sexual activity: Yes    Partners: Male    Birth control/protection: Post-menopausal  Other Topics Concern   Not on file  Social History Narrative   Not on file   Social Determinants of Health   Financial Resource Strain: Not on file  Food Insecurity: Not on file  Transportation Needs: Not on file  Physical Activity: Not on file  Stress: Not on file  Social Connections: Not on file   Past Surgical History:  Procedure Laterality Date   cataract surgery  2011   CESAREAN SECTION  times 2   COLONOSCOPY  2010   hems    ENDOMETRIAL BIOPSY     neg for CA cells   FACIAL COSMETIC SURGERY  2006   face lift   FOOT SURGERY  1998   rt heel spur removal   IR CT HEAD LTD  05/25/2021   IR PERCUTANEOUS ART THROMBECTOMY/INFUSION INTRACRANIAL INC DIAG ANGIO  05/25/2021   IR US GUIDE VASC ACCESS RIGHT  05/25/2021   RADIOLOGY WITH ANESTHESIA N/A 05/25/2021   Procedure: IR WITH ANESTHESIA;  Surgeon: Aletta Edouard, MD;  Location: [facility];  Service: Radiology;  Laterality: N/A;   REFRACTIVE SURGERY  08/1999   Past Medical History:  Diagnosis Date   Allergy    allergic rhinitis   Anxiety    Cataract    removed both eyes    Infertility, female    Osteoarthritis of both knees    OA    PONV (postoperative nausea and vomiting)    Post-menopausal bleeding    neg endo bx.   BP (!) 137/91   Pulse 64   Temp 98 F (36.7 C) (Oral)   Ht 5\' 4"  (1.626 m)   Wt 202 lb 6.4 oz (91.8 kg)   LMP 09/05/2008   SpO2 97%   BMI 34.74 kg/m    Opioid Risk Score:   Fall Risk Score:  `1  Depression screen PHQ 2/9  Depression screen [facility] 2/9 06/17/2021 05/20/2020 05/10/2019 05/03/2018 05/01/2017 04/29/2016 04/28/2015  Decreased Interest 0 0 1 0 0 1 0  Down, Depressed, Hopeless 0 1 3 0 0 0 0  PHQ - 2 Score 0 1 4 0 0 1 0  Altered sleeping - 2 3 0 1 - -  Tired, decreased energy - 0 0 0 1 - -  Change in appetite - 0 0 0 1 - -  Feeling bad or failure about yourself  - 0 0 0 1 - -  Trouble concentrating - 0 0 0 0 - -  Moving slowly or fidgety/restless - 0 0 0 0 - -  Suicidal thoughts - 0 0 0 0 - -  PHQ-9 Score - 3 7 0 4 - -  Difficult doing work/chores - Not difficult at all Not difficult at all Not difficult at all Not difficult at all - -     Review of Systems  All other systems reviewed and are negative.     Objective:   Physical Exam Awake, alert, appropriate, flat affect; slightly delayed processing/answering, accompanied by husband, no assistive device, NAD MS: RUE- deltoid 4/5; biceps 5/5; triceps 5/5; WE 4+/5; grip 5-/5 and finger abd 5-/5 LUE 5/5 in same muscles RLE: HF 4/5; KE 5-/5; KF 5-/5; DF and PF 5/5 LLE- HF 4/5; otherwise 5/5  Neuro:  Smile equal; tongue midline No facial or body sensation impairment- all intact to light touch x4.   No hoffman's B/L        Assessment & Plan:   Pt is a 70 yr old female with recent B/L MCA stroke and R ACA stroke and mild R hemiparesis and mild delayed processing as well as Afib, dysphagia which has resolved, and here for hospital f/u. Very mild aphasia/word finding?  Getting so much better; so will con't OT and SLP for now- per [place] 2.  Smart phones have apps for aphasia. Can also try on days not doing therapy.   3.  If develops any sign of spasticity then clal me for appointment to be seen.   4.  Signs are: spasms you can see; or harder to move R side through range of motion- can feel stiffness as well initially.    5. F/U as needed- call if develops spasticity-  if develops, should occur within 3 months.

## 2021-06-18 NOTE — Patient Instructions (Signed)
Pt is a 70 yr old female with recent B/L MCA stroke and R ACA stroke and mild R hemiparesis and mild delayed processing as well as Afib, dysphagia which has resolved, and here for hospital f/u. Very mild aphasia/word finding?  Getting so much better; so will con't OT and SLP for now- per [place] 2.  Smart phones have apps for aphasia. Can also try on days not doing therapy.   3.  If develops any sign of spasticity then clal me for appointment to be seen.   4. Signs are: spasms you can see; or harder to move R side through range of motion- can feel stiffness as well initially.    5. F/U as needed- call if develops spasticity- if develops, should occur within 3 months.

## 2021-06-21 ENCOUNTER — Ambulatory Visit: Payer: Medicare PPO

## 2021-06-21 ENCOUNTER — Telehealth: Payer: Self-pay | Admitting: *Deleted

## 2021-06-21 ENCOUNTER — Ambulatory Visit: Payer: Medicare PPO | Admitting: Speech Pathology

## 2021-06-21 ENCOUNTER — Other Ambulatory Visit: Payer: Self-pay

## 2021-06-21 DIAGNOSIS — I63512 Cerebral infarction due to unspecified occlusion or stenosis of left middle cerebral artery: Secondary | ICD-10-CM | POA: Diagnosis not present

## 2021-06-21 DIAGNOSIS — M6281 Muscle weakness (generalized): Secondary | ICD-10-CM

## 2021-06-21 DIAGNOSIS — R262 Difficulty in walking, not elsewhere classified: Secondary | ICD-10-CM

## 2021-06-21 DIAGNOSIS — R41841 Cognitive communication deficit: Secondary | ICD-10-CM | POA: Diagnosis not present

## 2021-06-21 DIAGNOSIS — R2681 Unsteadiness on feet: Secondary | ICD-10-CM

## 2021-06-21 DIAGNOSIS — I4819 Other persistent atrial fibrillation: Secondary | ICD-10-CM

## 2021-06-21 DIAGNOSIS — R4701 Aphasia: Secondary | ICD-10-CM | POA: Diagnosis not present

## 2021-06-21 DIAGNOSIS — R269 Unspecified abnormalities of gait and mobility: Secondary | ICD-10-CM | POA: Diagnosis not present

## 2021-06-21 NOTE — Telephone Encounter (Signed)
Left VM requesting pt to call the office back 

## 2021-06-21 NOTE — Therapy (Signed)
[place] MAIN [facility] SERVICES [address] [place], [place], [address] Phone: [phone]   Fax:  [phone]  Physical Therapy Treatment  Patient Details  Name: Deborah Carey MRN: [id number] Date of Birth: [birth year] No data recorded  Encounter Date: 06/21/2021   PT End of Session - 06/21/21 1001     Visit Number 2    Number of Visits 13    Date for PT Re-Evaluation 07/28/21    Authorization Type [employer]    Authorization Time Period 06/17/21-07/28/21    Progress Note Due on Visit 10    PT Start Time 0955    PT Stop Time 1025    PT Time Calculation (min) 30 min    Equipment Utilized During Treatment Gait belt    Activity Tolerance Patient tolerated treatment well    Behavior During Therapy WFL for tasks assessed/performed             Past Medical History:  Diagnosis Date   Allergy    allergic rhinitis   Anxiety    Cataract    removed both eyes    Infertility, female    Osteoarthritis of both knees    OA    PONV (postoperative nausea and vomiting)    Post-menopausal bleeding    neg endo bx.    Past Surgical History:  Procedure Laterality Date   cataract surgery  2011   CESAREAN SECTION     times 2   COLONOSCOPY  2010   hems    ENDOMETRIAL BIOPSY     neg for CA cells   FACIAL COSMETIC SURGERY  2006   face lift   FOOT SURGERY  1998   rt heel spur removal   IR CT HEAD LTD  05/25/2021   IR PERCUTANEOUS ART THROMBECTOMY/INFUSION INTRACRANIAL INC DIAG ANGIO  05/25/2021   IR US GUIDE VASC ACCESS RIGHT  05/25/2021   RADIOLOGY WITH ANESTHESIA N/A 05/25/2021   Procedure: IR WITH ANESTHESIA;  Surgeon: Aletta Edouard, MD;  Location: [place];  Service: Radiology;  Laterality: N/A;   REFRACTIVE SURGERY  08/1999    There were no vitals filed for this visit.   Subjective Assessment - 06/21/21 0959     Subjective Pt doing well today, nbo updates since her last PT appointment.    Pertinent History 70 yo Female s/p CVA  on 05/25/21; She was admitted to hospital for approximately 1 week and then discharged to inpatient rehab for 1 week. She was discharged home in October 2022. She reports since being home she has been doing well. She presents to therapy without assistive device. She denies using any assistive device at home. Denies any recent falls. She denies any stumbles or unsteadiness. She reports intermittent fatigue requiring rest at times. Pt reports she is not cooking at this time. She also reports she is not cleaning much. She reports trying some activities but fatigues quickly. She reports she does sleep well at night.She denies any numbness/tingling;    Diagnostic tests MRI on 05/26/21: Acute infarction of the left putamen and caudate. Minimal petechial  blood products but no frank hematoma. Mild swelling but no midline  shift.     Three punctate acute infarctions within the medial right frontal  lobe, probably micro embolic infarctions in the anterior cerebral  artery territory.     Old infarction in the right parietal cortical and subcortical brain  and seen affecting the cerebral hemispheric deep white matter  extensively.    Currently  in Pain? No/denies             INTERVENTION -STS from chair + airex, hands free x10  -Seated marching 1x10 BLE (on chair + airex)  -STS from chair + airex, hands free x10  -Seated marching 1x10 BLE (on chair + airex) (Has signs of FAI pain on right (end range anterior joint pain) but able to perform in a pain-free range when cued  -seated LAQ 1x15 bilat (subjective weakness on RLE) 2.5lbAW  -seated ankle DF, heel on airex pad, 2.5lb AW each (no frank subjective weakness)  -seated LAQ 1x15 bilat (subjective weakness on RLE) 2.5lbAW  -seated ankle DF, heel on airex pad, 2.5lb AW each (no frank subjective weakness)   -Standing heel raises BUE supported 1x20 (insync) (trial for HEP)  -SLS 1x30sec bilat, intemrittne LOB and BUE recovery      Access Code:  [email or web address] URL: [email or web address] Date: 06/21/2021 Prepared by: Rebbeca Paul  Exercises Standing Heel Raise with Support - 1 x daily - 3 x weekly - 2 sets - 20 reps Seated Long Arc Quad - 1 x daily - 3 x weekly - 2 sets - 15 reps Sit to Stand - 1 x daily - 3 x weekly - 3 sets - 5 reps     PT Education - 06/21/21 1001     Education Details HEP for resolving post CVA weakness    Person(s) Educated Patient    Methods Explanation    Comprehension Verbalized understanding;Returned demonstration              PT Short Term Goals - 06/16/21 1053       PT SHORT TERM GOAL #1   Title Patient will be adherent to HEP at least 3x a week to improve functional strength and balance for better safety at home.    Time 4    Period Weeks    Status New    Target Date 07/14/21               PT Long Term Goals - 06/16/21 1054       PT LONG TERM GOAL #1   Title Patient (> 43 years old) will complete five times sit to stand test in < 15 seconds indicating an increased LE strength and improved balance.    Time 6    Period Weeks    Status New    Target Date 07/28/21      PT LONG TERM GOAL #2   Title Patient will increase RLE gross strength to 4+/5 as to improve functional strength for independent gait, increased standing tolerance and increased ADL ability.    Time 6    Period Weeks    Status New    Target Date 07/28/21      PT LONG TERM GOAL #3   Title Patient will increase six minute walk test distance to >1300 for progression to community ambulator and improve gait ability closer to age group norms of 1644 feet;    Time 6    Period Weeks    Status New    Target Date 07/28/21      PT LONG TERM GOAL #4   Title Patient will ascend/descend 4 stairs without rail assist independently without loss of balance to improve ability to get in/out of home while carrying something.    Time 6    Period Weeks    Status New    Target Date 07/28/21      PT LONG TERM GOAL  #  5   Title Patient will improve FOTO score by 5% to indicate improved functional mobility with ADLs.    Time 6    Period Weeks    Status New    Target Date 07/28/21      Additional Long Term Goals   Additional Long Term Goals Yes      PT LONG TERM GOAL #6   Title Patient will increase Functional Gait Assessment score to >22/30 as to reduce fall risk and improve dynamic gait safety with community ambulation.    Time 6    Period Weeks    Status New    Target Date 07/28/21                   Plan - 06/21/21 1005     Clinical Impression Statement Began LE strengthening today to assess tolerance and specific deficits. Assigned some HEP activities for trial. Pt has some Rt foot chronic pain exacerbation in session, unclear how long it will flare pain, pt asked to monitor and report next session. Will focus on dynamic balance training next session.    Personal Factors and Comorbidities Age;Comorbidity 3+    Comorbidities history of chronic right foot pain, knee OA, hypercholesterolemia, abnormal heart rhythm (on blood thinners), osteopenia,    Examination-Activity Limitations Caring for Others;Squat;Stairs;Stand;Transfers    Examination-Participation Restrictions Cleaning;Community Activity;Shop;Volunteer    Stability/Clinical Decision Making Stable/Uncomplicated    Clinical Decision Making Low    Rehab Potential Good    PT Frequency 2x / week    PT Duration 6 weeks    PT Treatment/Interventions Cryotherapy;Moist Heat;Electrical Stimulation;Gait training;Stair training;Functional mobility training;Therapeutic activities;Therapeutic exercise;Balance training;Neuromuscular re-education;Patient/family education;Energy conservation    PT Next Visit Plan review HEP- high level balance training    PT Home Exercise Plan Visit 2:    Consulted and Agree with Plan of Care Patient             Patient will benefit from skilled therapeutic intervention in order to improve the following  deficits and impairments:  Abnormal gait, Decreased balance, Decreased endurance, Decreased mobility, Difficulty walking, Cardiopulmonary status limiting activity, Decreased strength, Decreased safety awareness, Decreased activity tolerance  Visit Diagnosis: Muscle weakness (generalized)  Unsteadiness on feet  Difficulty in walking, not elsewhere classified  Acute ischemic left middle cerebral artery (MCA) stroke (HCC)     Problem List Patient Active Problem List   Diagnosis Date Noted   Chronic ischemic left MCA stroke 06/18/2021   Cholelithiasis without cholecystitis 06/07/2021   Nodule of apex of right lung 06/07/2021   History of CVA (cerebrovascular accident) 05/25/2021   Atrial fibrillation ([place])    Mass of breast, left 11/13/2020   Colon cancer screening 05/14/2019   Vitamin D deficiency 05/08/2018   Flushing 06/20/2017   H/O fracture of fibula 01/21/2014   Osteopenia 01/07/2014   Family history of osteoporosis 11/29/2013   Estrogen deficiency 11/29/2013   Hyperlipidemia 11/29/2013   Encounter for Medicare annual wellness exam 10/29/2013   Irregular heartbeat 10/07/2013   Left thyroid nodule 09/26/2013   Enlarged thyroid 09/16/2013   Primary osteoarthritis of both knees 11/05/2012   Other screening mammogram 09/27/2011   Routine general medical examination at a health care facility 09/19/2011   Class 1 obesity due to excess calories with serious comorbidity and body mass index (BMI) of 34.0 to 34.9 in adult 06/06/2008   Prediabetes 06/06/2008   Anxiety state 04/17/2007   ALLERGIC RHINITIS 04/17/2007   10:30 AM, 06/21/21 Etta Grandchild, PT, DPT Physical  Therapist - [facility]  Outpatient Physical [place] [phone]     [place], [place] 06/21/2021, 10:08 AM  [place] MAIN [facility] SERVICES [address] [place], [place], [address] Phone: [phone]   Fax:   [phone]  Name: Deborah Carey MRN: [id number] Date of Birth: Jul 20, 1951

## 2021-06-21 NOTE — Patient Instructions (Signed)
Compete synonym worksheets Create description of 10 random words that were provided

## 2021-06-21 NOTE — Telephone Encounter (Signed)
-----   Message from Abner Greenspan, MD sent at 06/17/2021  5:29 PM EDT ----- I did referrals to pulmonary and neuro  I did not see a cardiology pending appt or recommendation-please ask if she has one and if not I will do a referral to f/u for a fib, thanks

## 2021-06-21 NOTE — Therapy (Signed)
[place] MAIN [facility] SERVICES [address] [place], [place], [address] Phone: [phone]   Fax:  [phone]  Speech Language Pathology Treatment  Patient Details  Name: Deborah Carey MRN: [id number] Date of Birth: [birth year] Referring Provider (SLP): Reesa Chew   Encounter Date: 06/21/2021   End of Session - 06/21/21 1322     Visit Number 2    Number of Visits 25    Date for SLP Re-Evaluation 09/03/21    Authorization Type [employer]    Authorization Time Period 06/11/2021 thru 09/03/2021    Authorization - Visit Number 2    Progress Note Due on Visit 10    SLP Start Time 0800    SLP Stop Time  0900    SLP Time Calculation (min) 60 min    Activity Tolerance Patient tolerated treatment well             Past Medical History:  Diagnosis Date   Allergy    allergic rhinitis   Anxiety    Cataract    removed both eyes    Infertility, female    Osteoarthritis of both knees    OA    PONV (postoperative nausea and vomiting)    Post-menopausal bleeding    neg endo bx.    Past Surgical History:  Procedure Laterality Date   cataract surgery  2011   CESAREAN SECTION     times 2   COLONOSCOPY  2010   hems    ENDOMETRIAL BIOPSY     neg for CA cells   FACIAL COSMETIC SURGERY  2006   face lift   FOOT SURGERY  1998   rt heel spur removal   IR CT HEAD LTD  05/25/2021   IR PERCUTANEOUS ART THROMBECTOMY/INFUSION INTRACRANIAL INC DIAG ANGIO  05/25/2021   IR US GUIDE VASC ACCESS RIGHT  05/25/2021   RADIOLOGY WITH ANESTHESIA N/A 05/25/2021   Procedure: IR WITH ANESTHESIA;  Surgeon: Aletta Edouard, MD;  Location: [place];  Service: Radiology;  Laterality: N/A;   REFRACTIVE SURGERY  08/1999    There were no vitals filed for this visit.   Subjective Assessment - 06/21/21 1321     Subjective pt pleasant, eager, motivated    Currently in Pain? No/denies                   ADULT SLP TREATMENT - 06/21/21 0001        Treatment Provided   Treatment provided Cognitive-Linquistic      Cognitive-Linquistic Treatment   Treatment focused on Aphasia;Patient/family/caregiver education    Skilled Treatment LANGUAGE: [facility] 1: Aphasia Re[facility] 3: Vocabulary - synonyms pages 67-70 - moderate assistance to provide synonym, pt benefited from functional sentence; page 70 - increased cognitive load of having pt write synonym - 95% independently; verbal description task - moderate assistance required to provide detailed description with word constraints              SLP Education - 06/21/21 1322     Education Details word finding strategies, performance within session    Person(s) Educated Patient    Methods Explanation;Demonstration;Verbal cues    Comprehension Verbalized understanding;Returned demonstration;Need further instruction              SLP Short Term Goals - 06/11/21 1056       SLP SHORT TERM GOAL #1   Title Pt will name at least 15 items in a named category independently with 100% accuracy.  Baseline 8 items    Time 10    Period --   sessions   Status New      SLP SHORT TERM GOAL #2   Title Patient will generate grammatical, fluent, and cogent sentences to complete abstract/complex linguistic task with 80% accuracy.    Baseline <80%    Time 10    Period --   sessions   Status New      SLP SHORT TERM GOAL #3   Title Pt will produce original, short sentences when given a target word with 85% accuracy and min verbal cues.    Baseline <75%    Time 10    Period --   sessions   Status New              SLP Long Term Goals - 06/11/21 1055       SLP LONG TERM GOAL #1   Title Pt will use functional communication skills for social interaction (e.g., greetings,  social etiquette, and short questions/simple sentences) with both familiar and unfamiliar  partners with 95% success.    Baseline < 80%    Time 12    Period Weeks    Status New    Target Date 09/03/21               Plan - 06/21/21 1323     Clinical Impression Statement Pt presents with higher level word finding deficits that result in vague nonspecific use of language that is halting in nature. Pt experiences increased difficulty when expressing complex thoughts/ideas. Skilled ST intervention is required to increase pt's functional independence and reduce caregiver burden.    Speech Therapy Frequency 2x / week    Duration 12 weeks    Treatment/Interventions Language facilitation;Compensatory techniques;SLP instruction and feedback;Patient/family education;Compensatory strategies    Potential to Achieve Goals Good    SLP Home Exercise Plan provided, see pt instruction section    Consulted and Agree with Plan of Care Patient             Patient will benefit from skilled therapeutic intervention in order to improve the following deficits and impairments:   Aphasia  Acute ischemic left middle cerebral artery (MCA) stroke [facility])    Problem List Patient Active Problem List   Diagnosis Date Noted   Chronic ischemic left MCA stroke 06/18/2021   Cholelithiasis without cholecystitis 06/07/2021   Nodule of apex of right lung 06/07/2021   History of CVA (cerebrovascular accident) 05/25/2021   Atrial fibrillation ([place])    Mass of breast, left 11/13/2020   Colon cancer screening 05/14/2019   Vitamin D deficiency 05/08/2018   Flushing 06/20/2017   H/O fracture of fibula 01/21/2014   Osteopenia 01/07/2014   Family history of osteoporosis 11/29/2013   Estrogen deficiency 11/29/2013   Hyperlipidemia 11/29/2013   Encounter for Medicare annual wellness exam 10/29/2013   Irregular heartbeat 10/07/2013   Left thyroid nodule 09/26/2013   Enlarged thyroid 09/16/2013   Primary osteoarthritis of both knees 11/05/2012   Other screening mammogram 09/27/2011   Routine general medical examination at a health care facility 09/19/2011   Class 1 obesity due to excess calories with serious comorbidity and  body mass index (BMI) of 34.0 to 34.9 in adult 06/06/2008   Prediabetes 06/06/2008   Anxiety state 04/17/2007   ALLERGIC RHINITIS 04/17/2007   Reyanne Hussar B. Rutherford Nail M.S., CCC-SLP, [place] Office [phone]  Omari Koslosky Rutherford Nail 06/21/2021, 1:24 PM  [place] MAIN REHAB  SERVICES [place], [place], [address] Phone: [phone]   Fax:  [phone]   Name: Deborah Carey MRN: [id number] Date of Birth: [birth year]

## 2021-06-21 NOTE — Telephone Encounter (Signed)
I put the referral in  She will get a call

## 2021-06-21 NOTE — Telephone Encounter (Signed)
Pt returning call. Pt states that she do not have a cardiologist.

## 2021-06-23 ENCOUNTER — Ambulatory Visit: Payer: Medicare PPO | Admitting: Speech Pathology

## 2021-06-23 ENCOUNTER — Ambulatory Visit: Payer: Medicare PPO | Admitting: Physical Therapy

## 2021-06-23 ENCOUNTER — Other Ambulatory Visit: Payer: Self-pay

## 2021-06-23 DIAGNOSIS — R4701 Aphasia: Secondary | ICD-10-CM

## 2021-06-23 DIAGNOSIS — I63512 Cerebral infarction due to unspecified occlusion or stenosis of left middle cerebral artery: Secondary | ICD-10-CM | POA: Diagnosis not present

## 2021-06-23 DIAGNOSIS — M6281 Muscle weakness (generalized): Secondary | ICD-10-CM | POA: Diagnosis not present

## 2021-06-23 DIAGNOSIS — R2681 Unsteadiness on feet: Secondary | ICD-10-CM | POA: Diagnosis not present

## 2021-06-23 DIAGNOSIS — R41841 Cognitive communication deficit: Secondary | ICD-10-CM | POA: Diagnosis not present

## 2021-06-23 DIAGNOSIS — R269 Unspecified abnormalities of gait and mobility: Secondary | ICD-10-CM | POA: Diagnosis not present

## 2021-06-23 DIAGNOSIS — R262 Difficulty in walking, not elsewhere classified: Secondary | ICD-10-CM | POA: Diagnosis not present

## 2021-06-23 NOTE — Therapy (Signed)
[place] MAIN [facility] SERVICES [address] [place], [place], [address] Phone: [phone]   Fax:  [phone]  Physical Therapy Treatment  Patient Details  Name: Deborah Carey MRN: [id number] Date of Birth: 07/19/1951 No data recorded  Encounter Date: 06/23/2021   PT End of Session - 06/23/21 1050     Visit Number 3    Number of Visits 13    Date for PT Re-Evaluation 07/28/21    Authorization Type [employer]    Authorization Time Period 06/17/21-07/28/21    Progress Note Due on Visit 10    PT Start Time 1029    PT Stop Time 1100    PT Time Calculation (min) 31 min    Equipment Utilized During Treatment Gait belt    Activity Tolerance Patient tolerated treatment well    Behavior During Therapy WFL for tasks assessed/performed             Past Medical History:  Diagnosis Date   Allergy    allergic rhinitis   Anxiety    Cataract    removed both eyes    Infertility, female    Osteoarthritis of both knees    OA    PONV (postoperative nausea and vomiting)    Post-menopausal bleeding    neg endo bx.    Past Surgical History:  Procedure Laterality Date   cataract surgery  2011   CESAREAN SECTION     times 2   COLONOSCOPY  2010   hems    ENDOMETRIAL BIOPSY     neg for CA cells   FACIAL COSMETIC SURGERY  2006   face lift   FOOT SURGERY  1998   rt heel spur removal   IR CT HEAD LTD  05/25/2021   IR PERCUTANEOUS ART THROMBECTOMY/INFUSION INTRACRANIAL INC DIAG ANGIO  05/25/2021   IR US GUIDE VASC ACCESS RIGHT  05/25/2021   RADIOLOGY WITH ANESTHESIA N/A 05/25/2021   Procedure: IR WITH ANESTHESIA;  Surgeon: Aletta Edouard, MD;  Location: [place];  Service: Radiology;  Laterality: N/A;   REFRACTIVE SURGERY  08/1999    There were no vitals filed for this visit.   Subjective Assessment - 06/23/21 1036     Subjective Pt doing well today, no updates since her last PT appointment. Pt reports she has been doing well since last  session.    Pertinent History 70 yo Female s/p CVA on 05/25/21; She was admitted to hospital for approximately 1 week and then discharged to inpatient rehab for 1 week. She was discharged home in October 2022. She reports since being home she has been doing well. She presents to therapy without assistive device. She denies using any assistive device at home. Denies any recent falls. She denies any stumbles or unsteadiness. She reports intermittent fatigue requiring rest at times. Pt reports she is not cooking at this time. She also reports she is not cleaning much. She reports trying some activities but fatigues quickly. She reports she does sleep well at night.She denies any numbness/tingling;    Limitations Standing;Walking    How long can you stand comfortably? 10-15 min;    How long can you walk comfortably? approximately 10 min, >500 feet; Does fatigue with long distance walking    Diagnostic tests MRI on 05/26/21: Acute infarction of the left putamen and caudate. Minimal petechial  blood products but no frank hematoma. Mild swelling but no midline  shift.     Three punctate acute infarctions within the  medial right frontal  lobe, probably micro embolic infarctions in the anterior cerebral  artery territory.     Old infarction in the right parietal cortical and subcortical brain  and seen affecting the cerebral hemispheric deep white matter  extensively.    Patient Stated Goals improve energy; be able to walk better- faster and longer;    Currently in Pain? Yes    Pain Score 4     Pain Location Foot    Pain Orientation Left    Pain Descriptors / Indicators Sore    Pain Type Chronic pain    Pain Onset More than a month ago    Pain Frequency Intermittent    Aggravating Factors  walking    Pain Relieving Factors rest    Multiple Pain Sites No              Treatment provided this session  Therex:    Nustep level 3 UE and LE cues for maintenance of cadence -5 min for improved LE  strength and endurance   STS with airex pad  2 x 10  -without airex pad pt has decreased eccentric control  Heel raise 2 x 15  -hands on bar, cues for using hands only as balance and not for pressing up  LAQ 10 x 5 sec holds  -2#, pt rated easy  Leg press -2 x 15 -2 sets at 40#  Standing marching with 2# ankle weight  10 x ea LE    Pt required occasional rest breaks due fatigue, PT was quick to ask when pt appeared to be fatiguing in order to prevent excessive fatigue.  Pt educated throughout session about proper posture and technique with exercises. Improved exercise technique, movement at target joints, use of target muscles after min to mod verbal, visual, tactile cues.                           PT Education - 06/23/21 1149     Education Details Patient educated regarding use of gym equipment outside of therapy is also educated regarding fitness center option at hospital.    Person(s) Educated Patient;Spouse    Methods Explanation;Handout    Comprehension Verbalized understanding              PT Short Term Goals - 06/16/21 1053       PT SHORT TERM GOAL #1   Title Patient will be adherent to HEP at least 3x a week to improve functional strength and balance for better safety at home.    Time 4    Period Weeks    Status New    Target Date 07/14/21               PT Long Term Goals - 06/16/21 1054       PT LONG TERM GOAL #1   Title Patient (> 53 years old) will complete five times sit to stand test in < 15 seconds indicating an increased LE strength and improved balance.    Time 6    Period Weeks    Status New    Target Date 07/28/21      PT LONG TERM GOAL #2   Title Patient will increase RLE gross strength to 4+/5 as to improve functional strength for independent gait, increased standing tolerance and increased ADL ability.    Time 6    Period Weeks    Status New    Target Date 07/28/21  PT LONG TERM GOAL #3   Title  Patient will increase six minute walk test distance to >1300 for progression to community ambulator and improve gait ability closer to age group norms of 1644 feet;    Time 6    Period Weeks    Status New    Target Date 07/28/21      PT LONG TERM GOAL #4   Title Patient will ascend/descend 4 stairs without rail assist independently without loss of balance to improve ability to get in/out of home while carrying something.    Time 6    Period Weeks    Status New    Target Date 07/28/21      PT LONG TERM GOAL #5   Title Patient will improve FOTO score by 5% to indicate improved functional mobility with ADLs.    Time 6    Period Weeks    Status New    Target Date 07/28/21      Additional Long Term Goals   Additional Long Term Goals Yes      PT LONG TERM GOAL #6   Title Patient will increase Functional Gait Assessment score to >22/30 as to reduce fall risk and improve dynamic gait safety with community ambulation.    Time 6    Period Weeks    Status New    Target Date 07/28/21                   Plan - 06/23/21 1050     Clinical Impression Statement Pt toleerated treatmetn session well. Focuessed on various activities involving muscular endurance and to improve functional outcomes. Pt was also educated and provided information regarding [place] wellzone for further improvement in funtion during and following therapy session. Pt will continue to benefit from skilled physical theray services in order to improve her endurance, function, balance, and overall QOL    Personal Factors and Comorbidities Age;Comorbidity 3+    Comorbidities history of chronic right foot pain, knee OA, hypercholesterolemia, abnormal heart rhythm (on blood thinners), osteopenia,    Examination-Activity Limitations Caring for Others;Squat;Stairs;Stand;Transfers    Examination-Participation Restrictions Cleaning;Community Activity;Shop;Volunteer    Stability/Clinical Decision Making Stable/Uncomplicated     Rehab Potential Good    PT Frequency 2x / week    PT Duration 6 weeks    PT Treatment/Interventions Cryotherapy;Moist Heat;Electrical Stimulation;Gait training;Stair training;Functional mobility training;Therapeutic activities;Therapeutic exercise;Balance training;Neuromuscular re-education;Patient/family education;Energy conservation    PT Next Visit Plan review HEP- high level balance training    PT Home Exercise Plan Visit 2:    Consulted and Agree with Plan of Care Patient             Patient will benefit from skilled therapeutic intervention in order to improve the following deficits and impairments:  Abnormal gait, Decreased balance, Decreased endurance, Decreased mobility, Difficulty walking, Cardiopulmonary status limiting activity, Decreased strength, Decreased safety awareness, Decreased activity tolerance  Visit Diagnosis: Muscle weakness (generalized)  Unsteadiness on feet  Difficulty in walking, not elsewhere classified     Problem List Patient Active Problem List   Diagnosis Date Noted   Chronic ischemic left MCA stroke 06/18/2021   Cholelithiasis without cholecystitis 06/07/2021   Nodule of apex of right lung 06/07/2021   History of CVA (cerebrovascular accident) 05/25/2021   Atrial fibrillation (HCC)    Mass of breast, left 11/13/2020   Colon cancer screening 05/14/2019   Vitamin D deficiency 05/08/2018   Flushing 06/20/2017   H/O fracture of fibula 01/21/2014   Osteopenia 01/07/2014  Family history of osteoporosis 11/29/2013   Estrogen deficiency 11/29/2013   Hyperlipidemia 11/29/2013   Encounter for Medicare annual wellness exam 10/29/2013   Irregular heartbeat 10/07/2013   Left thyroid nodule 09/26/2013   Enlarged thyroid 09/16/2013   Primary osteoarthritis of both knees 11/05/2012   Other screening mammogram 09/27/2011   Routine general medical examination at a health care facility 09/19/2011   Class 1 obesity due to excess calories with serious  comorbidity and body mass index (BMI) of 34.0 to 34.9 in adult 06/06/2008   Prediabetes 06/06/2008   Anxiety state 04/17/2007   ALLERGIC RHINITIS 04/17/2007    Particia Lather, PT 06/23/2021, 12:21 PM  [place] [address] [place], [place], [address] Phone: [phone]   Fax:  [phone]  Name: Deborah Carey MRN: [id number] Date of Birth: 1951-01-29

## 2021-06-24 NOTE — Therapy (Signed)
[place] MAIN [facility] SERVICES [address] [place], [place], [address] Phone: [phone]   Fax:  [phone]  Speech Language Pathology Treatment  Patient Details  Name: Deborah Carey MRN: [id number] Date of Birth: Oct 27, 1950 Referring Provider (SLP): Reesa Chew   Encounter Date: 06/23/2021   End of Session - 06/24/21 1431     Visit Number 3    Number of Visits 25    Date for SLP Re-Evaluation 09/03/21    Authorization Type [employer]    Authorization Time Period 06/11/2021 thru 09/03/2021    Authorization - Visit Number 3    Progress Note Due on Visit 10    SLP Start Time 0900    SLP Stop Time  1000    SLP Time Calculation (min) 60 min    Activity Tolerance Patient tolerated treatment well             Past Medical History:  Diagnosis Date   Allergy    allergic rhinitis   Anxiety    Cataract    removed both eyes    Infertility, female    Osteoarthritis of both knees    OA    PONV (postoperative nausea and vomiting)    Post-menopausal bleeding    neg endo bx.    Past Surgical History:  Procedure Laterality Date   cataract surgery  2011   CESAREAN SECTION     times 2   COLONOSCOPY  2010   hems    ENDOMETRIAL BIOPSY     neg for CA cells   FACIAL COSMETIC SURGERY  2006   face lift   FOOT SURGERY  1998   rt heel spur removal   IR CT HEAD LTD  05/25/2021   IR PERCUTANEOUS ART THROMBECTOMY/INFUSION INTRACRANIAL INC DIAG ANGIO  05/25/2021   IR US GUIDE VASC ACCESS RIGHT  05/25/2021   RADIOLOGY WITH ANESTHESIA N/A 05/25/2021   Procedure: IR WITH ANESTHESIA;  Surgeon: Aletta Edouard, MD;  Location: [place];  Service: Radiology;  Laterality: N/A;   REFRACTIVE SURGERY  08/1999    There were no vitals filed for this visit.   Subjective Assessment - 06/24/21 1417     Subjective pt pleasant, eager, motivated, completed HEP    Currently in Pain? No/denies                   ADULT SLP TREATMENT - 06/24/21  0001       Treatment Provided   Treatment provided Cognitive-Linquistic      Cognitive-Linquistic Treatment   Treatment focused on Aphasia;Patient/family/caregiver education    Skilled Treatment LANGUAGE: synonym HEP completed using complex synonyms, concise, well organized description of complex thoughts with advanced detail, great use of word finding strategies              SLP Education - 06/24/21 1425     Education Details progress towards goals    Person(s) Educated Patient    Methods Explanation;Demonstration;Verbal cues    Comprehension Verbalized understanding;Returned demonstration              SLP Short Term Goals - 06/11/21 1056       SLP SHORT TERM GOAL #1   Title Pt will name at least 15 items in a named category independently with 100% accuracy.    Baseline 8 items    Time 10    Period --   sessions   Status New      SLP SHORT TERM GOAL #2  Title Patient will generate grammatical, fluent, and cogent sentences to complete abstract/complex linguistic task with 80% accuracy.    Baseline <80%    Time 10    Period --   sessions   Status New      SLP SHORT TERM GOAL #3   Title Pt will produce original, short sentences when given a target word with 85% accuracy and min verbal cues.    Baseline <75%    Time 10    Period --   sessions   Status New              SLP Long Term Goals - 06/11/21 1055       SLP LONG TERM GOAL #1   Title Pt will use functional communication skills for social interaction (e.g., greetings,  social etiquette, and short questions/simple sentences) with both familiar and unfamiliar  partners with 95% success.    Baseline < 80%    Time 12    Period Weeks    Status New    Target Date 09/03/21              Plan - 06/24/21 1432     Clinical Impression Statement Pt presents with higher level word finding deficits that result in vague nonspecific use of language that is halting in nature. Pt experiences increased  difficulty when expressing complex thoughts/ideas especially when working cross word puzzles. Skilled ST intervention is required to increase pt's functional independence and reduce caregiver burden.    Speech Therapy Frequency 2x / week    Duration 12 weeks    Treatment/Interventions Language facilitation;Compensatory techniques;SLP instruction and feedback;Patient/family education;Compensatory strategies    Potential to Achieve Goals Good    SLP Home Exercise Plan provided, see pt instruction section    Consulted and Agree with Plan of Care Patient             Patient will benefit from skilled therapeutic intervention in order to improve the following deficits and impairments:   Aphasia  Acute ischemic left middle cerebral artery (MCA) stroke [facility])    Problem List Patient Active Problem List   Diagnosis Date Noted   Chronic ischemic left MCA stroke 06/18/2021   Cholelithiasis without cholecystitis 06/07/2021   Nodule of apex of right lung 06/07/2021   History of CVA (cerebrovascular accident) 05/25/2021   Atrial fibrillation ([place])    Mass of breast, left 11/13/2020   Colon cancer screening 05/14/2019   Vitamin D deficiency 05/08/2018   Flushing 06/20/2017   H/O fracture of fibula 01/21/2014   Osteopenia 01/07/2014   Family history of osteoporosis 11/29/2013   Estrogen deficiency 11/29/2013   Hyperlipidemia 11/29/2013   Encounter for Medicare annual wellness exam 10/29/2013   Irregular heartbeat 10/07/2013   Left thyroid nodule 09/26/2013   Enlarged thyroid 09/16/2013   Primary osteoarthritis of both knees 11/05/2012   Other screening mammogram 09/27/2011   Routine general medical examination at a health care facility 09/19/2011   Class 1 obesity due to excess calories with serious comorbidity and body mass index (BMI) of 34.0 to 34.9 in adult 06/06/2008   Prediabetes 06/06/2008   Anxiety state 04/17/2007   ALLERGIC RHINITIS 04/17/2007   Shenique Childers B. Rutherford Nail M.S.,  CCC-SLP, [place] Pathologist Rehabilitation Services Office [phone]  Stormy Fabian 06/24/2021, 2:32 PM  [place] MAIN [facility] SERVICES [address] [place], [place], [address] Phone: [phone]   Fax:  [phone]   Name: Deborah Carey MRN: [id number] Date of Birth:  05/13/1951  

## 2021-06-24 NOTE — Patient Instructions (Signed)
Resume reading Resume daily events like eating lunch with friends Complete sentence generating worksheets, crossword puzzles

## 2021-06-25 ENCOUNTER — Encounter: Payer: Medicare PPO | Admitting: Speech Pathology

## 2021-06-25 ENCOUNTER — Ambulatory Visit: Payer: Medicare PPO

## 2021-06-28 ENCOUNTER — Ambulatory Visit: Payer: Medicare PPO | Admitting: Speech Pathology

## 2021-06-28 ENCOUNTER — Other Ambulatory Visit: Payer: Self-pay

## 2021-06-28 ENCOUNTER — Ambulatory Visit: Payer: Medicare PPO

## 2021-06-28 ENCOUNTER — Encounter: Payer: Medicare PPO | Admitting: Speech Pathology

## 2021-06-28 DIAGNOSIS — R41841 Cognitive communication deficit: Secondary | ICD-10-CM | POA: Diagnosis not present

## 2021-06-28 DIAGNOSIS — I63512 Cerebral infarction due to unspecified occlusion or stenosis of left middle cerebral artery: Secondary | ICD-10-CM

## 2021-06-28 DIAGNOSIS — R2681 Unsteadiness on feet: Secondary | ICD-10-CM

## 2021-06-28 DIAGNOSIS — R262 Difficulty in walking, not elsewhere classified: Secondary | ICD-10-CM | POA: Diagnosis not present

## 2021-06-28 DIAGNOSIS — R4701 Aphasia: Secondary | ICD-10-CM | POA: Diagnosis not present

## 2021-06-28 DIAGNOSIS — R269 Unspecified abnormalities of gait and mobility: Secondary | ICD-10-CM | POA: Diagnosis not present

## 2021-06-28 DIAGNOSIS — M6281 Muscle weakness (generalized): Secondary | ICD-10-CM

## 2021-06-28 NOTE — Therapy (Signed)
[place] MAIN [facility] SERVICES [address] [place], [place], [address] Phone: [phone]   Fax:  [phone]  Speech Language Pathology Treatment  Patient Details  Name: Deborah Carey MRN: [id number] Date of Birth: [birth year] Referring Provider (SLP): Reesa Chew   Encounter Date: 06/28/2021   End of Session - 06/28/21 1710     Visit Number 4    Number of Visits 25    Date for SLP Re-Evaluation 09/03/21    Authorization Type [employer]    Authorization Time Period 06/11/2021 thru 09/03/2021    Authorization - Visit Number 4    Progress Note Due on Visit 10    SLP Start Time 1000    SLP Stop Time  1120    SLP Time Calculation (min) 80 min    Activity Tolerance Patient tolerated treatment well             Past Medical History:  Diagnosis Date   Allergy    allergic rhinitis   Anxiety    Cataract    removed both eyes    Infertility, female    Osteoarthritis of both knees    OA    PONV (postoperative nausea and vomiting)    Post-menopausal bleeding    neg endo bx.    Past Surgical History:  Procedure Laterality Date   cataract surgery  2011   CESAREAN SECTION     times 2   COLONOSCOPY  2010   hems    ENDOMETRIAL BIOPSY     neg for CA cells   FACIAL COSMETIC SURGERY  2006   face lift   FOOT SURGERY  1998   rt heel spur removal   IR CT HEAD LTD  05/25/2021   IR PERCUTANEOUS ART THROMBECTOMY/INFUSION INTRACRANIAL INC DIAG ANGIO  05/25/2021   IR US GUIDE VASC ACCESS RIGHT  05/25/2021   RADIOLOGY WITH ANESTHESIA N/A 05/25/2021   Procedure: IR WITH ANESTHESIA;  Surgeon: Aletta Edouard, MD;  Location: [place];  Service: Radiology;  Laterality: N/A;   REFRACTIVE SURGERY  08/1999    There were no vitals filed for this visit.   Subjective Assessment - 06/28/21 1707     Subjective pt reports that she drove on Sunday, went out to eat with her friends twice, brought in the book that she is reading, completed all HEP     Currently in Pain? No/denies                   ADULT SLP TREATMENT - 06/28/21 0001       Treatment Provided   Treatment provided Cognitive-Linquistic      Cognitive-Linquistic Treatment   Treatment focused on Aphasia;Patient/family/caregiver education    Skilled Treatment LANGUAGE: independently created high level advance complex sentences given specific word to use; independenty completed intermediate crossword puzzle wit > 90% accuracy              SLP Education - 06/28/21 1709     Education Details progress towards goals, language enriching activities, plan to discharge from services on next session    Person(s) Educated Patient    Methods Explanation;Demonstration;Verbal cues    Comprehension Verbalized understanding;Returned demonstration              SLP Short Term Goals - 06/11/21 1056       SLP SHORT TERM GOAL #1   Title Pt will name at least 15 items in a named category independently with 100% accuracy.  Baseline 8 items    Time 10    Period --   sessions   Status New      SLP SHORT TERM GOAL #2   Title Patient will generate grammatical, fluent, and cogent sentences to complete abstract/complex linguistic task with 80% accuracy.    Baseline <80%    Time 10    Period --   sessions   Status New      SLP SHORT TERM GOAL #3   Title Pt will produce original, short sentences when given a target word with 85% accuracy and min verbal cues.    Baseline <75%    Time 10    Period --   sessions   Status New              SLP Long Term Goals - 06/11/21 1055       SLP LONG TERM GOAL #1   Title Pt will use functional communication skills for social interaction (e.g., greetings,  social etiquette, and short questions/simple sentences) with both familiar and unfamiliar  partners with 95% success.    Baseline < 80%    Time 12    Period Weeks    Status New    Target Date 09/03/21              Plan - 06/28/21 1711     Clinical  Impression Statement Pt presents with resolving higher level word finding deficits. At this time, she is able to [facility] complex abstract information by using written language and expressive language. Education has been ongoing with an additional session required prior to discharge to complete education on word finding strategies.    Speech Therapy Frequency 2x / week    Duration 12 weeks    Treatment/Interventions Language facilitation;Compensatory techniques;SLP instruction and feedback;Patient/family education;Compensatory strategies    Potential to Achieve Goals Good    SLP Home Exercise Plan provided, see pt instruction section    Consulted and Agree with Plan of Care Patient             Patient will benefit from skilled therapeutic intervention in order to improve the following deficits and impairments:   Aphasia  Acute ischemic left middle cerebral artery (MCA) stroke [facility])    Problem List Patient Active Problem List   Diagnosis Date Noted   Chronic ischemic left MCA stroke 06/18/2021   Cholelithiasis without cholecystitis 06/07/2021   Nodule of apex of right lung 06/07/2021   History of CVA (cerebrovascular accident) 05/25/2021   Atrial fibrillation ([place])    Mass of breast, left 11/13/2020   Colon cancer screening 05/14/2019   Vitamin D deficiency 05/08/2018   Flushing 06/20/2017   H/O fracture of fibula 01/21/2014   Osteopenia 01/07/2014   Family history of osteoporosis 11/29/2013   Estrogen deficiency 11/29/2013   Hyperlipidemia 11/29/2013   Encounter for Medicare annual wellness exam 10/29/2013   Irregular heartbeat 10/07/2013   Left thyroid nodule 09/26/2013   Enlarged thyroid 09/16/2013   Primary osteoarthritis of both knees 11/05/2012   Other screening mammogram 09/27/2011   Routine general medical examination at a health care facility 09/19/2011   Class 1 obesity due to excess calories with serious comorbidity and body mass index (BMI) of 34.0 to 34.9 in  adult 06/06/2008   Prediabetes 06/06/2008   Anxiety state 04/17/2007   ALLERGIC RHINITIS 04/17/2007   Nowell Sites B. Rutherford Nail M.S., CCC-SLP, [place] Pathologist Rehabilitation Services Office [phone]  Stormy Fabian 06/28/2021, 5:13 PM  [place]  [place], [place], [address] Phone: [phone]   Fax:  [phone]   Name: Deborah Carey MRN: [id number] Date of Birth: 1951-05-20

## 2021-06-28 NOTE — Therapy (Signed)
[place] MAIN [facility] SERVICES [address] [place], [place], [address] Phone: [phone]   Fax:  [phone]  Physical Therapy Treatment  Patient Details  Name: Deborah Carey MRN: [id number] Date of Birth: 27-May-1951 No data recorded  Encounter Date: 06/28/2021   PT End of Session - 06/28/21 0911     Visit Number 4    Number of Visits 13    Date for PT Re-Evaluation 07/28/21    Authorization Type [employer]    Authorization Time Period 06/17/21-07/28/21    Progress Note Due on Visit 10    PT Start Time 0915    PT Stop Time 0959    PT Time Calculation (min) 44 min    Equipment Utilized During Treatment Gait belt    Activity Tolerance Patient tolerated treatment well    Behavior During Therapy WFL for tasks assessed/performed             Past Medical History:  Diagnosis Date   Allergy    allergic rhinitis   Anxiety    Cataract    removed both eyes    Infertility, female    Osteoarthritis of both knees    OA    PONV (postoperative nausea and vomiting)    Post-menopausal bleeding    neg endo bx.    Past Surgical History:  Procedure Laterality Date   cataract surgery  2011   CESAREAN SECTION     times 2   COLONOSCOPY  2010   hems    ENDOMETRIAL BIOPSY     neg for CA cells   FACIAL COSMETIC SURGERY  2006   face lift   FOOT SURGERY  1998   rt heel spur removal   IR CT HEAD LTD  05/25/2021   IR PERCUTANEOUS ART THROMBECTOMY/INFUSION INTRACRANIAL INC DIAG ANGIO  05/25/2021   IR US GUIDE VASC ACCESS RIGHT  05/25/2021   RADIOLOGY WITH ANESTHESIA N/A 05/25/2021   Procedure: IR WITH ANESTHESIA;  Surgeon: Aletta Edouard, MD;  Location: [facility];  Service: Radiology;  Laterality: N/A;   REFRACTIVE SURGERY  08/1999    There were no vitals filed for this visit.   Subjective Assessment - 06/28/21 0909     Subjective Patient reports ongoing intermittent right foot pain and right side weakness.    Pertinent History 70 yo  Female s/p CVA on 05/25/21; She was admitted to hospital for approximately 1 week and then discharged to inpatient rehab for 1 week. She was discharged home in October 2022. She reports since being home she has been doing well. She presents to therapy without assistive device. She denies using any assistive device at home. Denies any recent falls. She denies any stumbles or unsteadiness. She reports intermittent fatigue requiring rest at times. Pt reports she is not cooking at this time. She also reports she is not cleaning much. She reports trying some activities but fatigues quickly. She reports she does sleep well at night.She denies any numbness/tingling;    Limitations Standing;Walking    How long can you stand comfortably? 10-15 min;    How long can you walk comfortably? approximately 10 min, >500 feet; Does fatigue with long distance walking    Diagnostic tests MRI on 05/26/21: Acute infarction of the left putamen and caudate. Minimal petechial  blood products but no frank hematoma. Mild swelling but no midline  shift.     Three punctate acute infarctions within the medial right frontal  lobe, probably micro embolic infarctions in  the anterior cerebral  artery territory.     Old infarction in the right parietal cortical and subcortical brain  and seen affecting the cerebral hemispheric deep white matter  extensively.    Patient Stated Goals improve energy; be able to walk better- faster and longer;    Currently in Pain? Other (Comment)   No pain at this moment but does reports some right foot pain with weight bearing.   Pain Location Foot    Pain Orientation Right;Anterior    Pain Descriptors / Indicators Aching    Pain Type Chronic pain    Pain Onset More than a month ago    Pain Frequency Intermittent    Aggravating Factors  Standing, walking    Pain Relieving Factors rest    Multiple Pain Sites No             INTERVENTIONS:    Therapeutic Exercises:   Observed gait- Patient  ambulated in gym/hallway without an AD with observed decreased right heel strike/toe off. VC to try to increase step length. Patient also performed gait in hallway- able to follow VC for horizontal head turns without LOB today.    high march with UE support x 20 reps BLE- VC for max ROM height.   High march without UE support- VC to slow down for more focus on balance    Attempted calf raises and toe raises- *painful right foot today- exercise ceased  Assessed right foot - ant metatarsal pain- No pain with OKC active motion.     Neuro- re-ed:  SLS- Attempt to stand 4-5 sec each leg x multiple attempts with more difficulty with right LE- requiring intermittent BUE Support.  Tandem standing - hold position x 10-15 sec (Practicing mult attempts each leg) more difficulty with right LE posterior.  Feet narrowed with static stand with eyes open x 20 sec- no LOB Feet narrowed with static stand with eyes closed x 20 sec x 2 with minlincrease lateral and A/P Sway.  STS - VC to lean forward x 10 reps without UE Support - VC for correct technique. Education provided throughout session via VC/TC and demonstration to facilitate movement at target joints and correct muscle activation for all testing and exercises performed.   Clinical Impression: Patient continues to be somewhat limited with progressing neuro-re ed due to ongoing right foot pain. Unable to focus on heel to toe gait sequencing activities due to pain. Patient was able to perform several balance activities without increased pain and patient present with good motivation and responsive to all VC today. Pt will continue to benefit from skilled physical theray services in order to improve her endurance, function, balance, and overall QOL      Access Code: [email or web address] URL: [email or web address] Date: 06/28/2021 Prepared by: Sande Brothers  Exercises Standing Tandem Balance with Counter Support - 1 x daily - 3 x weekly -  1 sets - 10 reps - 10-20 hold Standing Single Leg Stance with Counter Support - 1 x daily - 3 x weekly - 3 sets - 10-20 hold Seated Hip Abduction with Resistance - 1 x daily - 3 x weekly - 3 sets - 10 reps - 2-3 hold                 PT Education - 06/28/21 1135     Education Details Exercise technique- Discussed of need to f/u with MD regarding chronic intermittent foot pain    Person(s) Educated Patient    Methods Explanation;Demonstration;Tactile cues;Verbal cues;Handout  Comprehension Verbalized understanding;Returned demonstration;Verbal cues required;Need further instruction;Tactile cues required              PT Short Term Goals - 06/16/21 1053       PT SHORT TERM GOAL #1   Title Patient will be adherent to HEP at least 3x a week to improve functional strength and balance for better safety at home.    Time 4    Period Weeks    Status New    Target Date 07/14/21               PT Long Term Goals - 06/16/21 1054       PT LONG TERM GOAL #1   Title Patient (> 72 years old) will complete five times sit to stand test in < 15 seconds indicating an increased LE strength and improved balance.    Time 6    Period Weeks    Status New    Target Date 07/28/21      PT LONG TERM GOAL #2   Title Patient will increase RLE gross strength to 4+/5 as to improve functional strength for independent gait, increased standing tolerance and increased ADL ability.    Time 6    Period Weeks    Status New    Target Date 07/28/21      PT LONG TERM GOAL #3   Title Patient will increase six minute walk test distance to >1300 for progression to community ambulator and improve gait ability closer to age group norms of 1644 feet;    Time 6    Period Weeks    Status New    Target Date 07/28/21      PT LONG TERM GOAL #4   Title Patient will ascend/descend 4 stairs without rail assist independently without loss of balance to improve ability to get in/out of home while  carrying something.    Time 6    Period Weeks    Status New    Target Date 07/28/21      PT LONG TERM GOAL #5   Title Patient will improve FOTO score by 5% to indicate improved functional mobility with ADLs.    Time 6    Period Weeks    Status New    Target Date 07/28/21      Additional Long Term Goals   Additional Long Term Goals Yes      PT LONG TERM GOAL #6   Title Patient will increase Functional Gait Assessment score to >22/30 as to reduce fall risk and improve dynamic gait safety with community ambulation.    Time 6    Period Weeks    Status New    Target Date 07/28/21                   Plan - 06/28/21 0912     Clinical Impression Statement Patient continues to be somewhat limited with progressing neuro-re ed due to ongoing right foot pain. Unable to focus on heel to toe gait sequencing activities due to pain. Patient was able to perform several balance activities without increased pain and patient present with good motivation and responsive to all VC today. Pt will continue to benefit from skilled physical theray services in order to improve her endurance, function, balance, and overall QOL    Personal Factors and Comorbidities Age;Comorbidity 3+    Comorbidities history of chronic right foot pain, knee OA, hypercholesterolemia, abnormal heart rhythm (on blood thinners), osteopenia,    Examination-Activity  Limitations Caring for Others;Squat;Stairs;Stand;Transfers    Examination-Participation Restrictions Cleaning;Community Activity;Shop;Volunteer    Stability/Clinical Decision Making Stable/Uncomplicated    Rehab Potential Good    PT Frequency 2x / week    PT Duration 6 weeks    PT Treatment/Interventions Cryotherapy;Moist Heat;Electrical Stimulation;Gait training;Stair training;Functional mobility training;Therapeutic activities;Therapeutic exercise;Balance training;Neuromuscular re-education;Patient/family education;Energy conservation    PT Next Visit Plan  review HEP- high level balance training: assess Right foot for pain as appropriate.    PT Home Exercise Plan 06/28/2021: Access Code: [email or web address]    Consulted and Agree with Plan of Care Patient             Patient will benefit from skilled therapeutic intervention in order to improve the following deficits and impairments:  Abnormal gait, Decreased balance, Decreased endurance, Decreased mobility, Difficulty walking, Cardiopulmonary status limiting activity, Decreased strength, Decreased safety awareness, Decreased activity tolerance  Visit Diagnosis: Abnormality of gait and mobility  Difficulty in walking, not elsewhere classified  Muscle weakness (generalized)  Unsteadiness on feet     Problem List Patient Active Problem List   Diagnosis Date Noted   Chronic ischemic left MCA stroke 06/18/2021   Cholelithiasis without cholecystitis 06/07/2021   Nodule of apex of right lung 06/07/2021   History of CVA (cerebrovascular accident) 05/25/2021   Atrial fibrillation ([place])    Mass of breast, left 11/13/2020   Colon cancer screening 05/14/2019   Vitamin D deficiency 05/08/2018   Flushing 06/20/2017   H/O fracture of fibula 01/21/2014   Osteopenia 01/07/2014   Family history of osteoporosis 11/29/2013   Estrogen deficiency 11/29/2013   Hyperlipidemia 11/29/2013   Encounter for Medicare annual wellness exam 10/29/2013   Irregular heartbeat 10/07/2013   Left thyroid nodule 09/26/2013   Enlarged thyroid 09/16/2013   Primary osteoarthritis of both knees 11/05/2012   Other screening mammogram 09/27/2011   Routine general medical examination at a health care facility 09/19/2011   Class 1 obesity due to excess calories with serious comorbidity and body mass index (BMI) of 34.0 to 34.9 in adult 06/06/2008   Prediabetes 06/06/2008   Anxiety state 04/17/2007   ALLERGIC RHINITIS 04/17/2007    Lewis Moccasin, PT 06/28/2021, 10:07 PM  [place] MAIN [facility] SERVICES [address] [place], [place], [address] Phone: [phone]   Fax:  [phone]  Name: Deborah Carey MRN: [id number] Date of Birth: June 14, 1951

## 2021-06-28 NOTE — Patient Instructions (Signed)
Continue working crossword puzzles, engaging in language rich activities such as lunch with friends, reading

## 2021-06-30 ENCOUNTER — Other Ambulatory Visit: Payer: Self-pay

## 2021-06-30 ENCOUNTER — Ambulatory Visit: Payer: Medicare PPO | Admitting: Speech Pathology

## 2021-06-30 ENCOUNTER — Other Ambulatory Visit: Payer: Self-pay | Admitting: Family Medicine

## 2021-06-30 ENCOUNTER — Encounter: Payer: Self-pay | Admitting: Physical Therapy

## 2021-06-30 ENCOUNTER — Ambulatory Visit: Payer: Medicare PPO | Admitting: Physical Therapy

## 2021-06-30 DIAGNOSIS — R262 Difficulty in walking, not elsewhere classified: Secondary | ICD-10-CM | POA: Diagnosis not present

## 2021-06-30 DIAGNOSIS — I63512 Cerebral infarction due to unspecified occlusion or stenosis of left middle cerebral artery: Secondary | ICD-10-CM

## 2021-06-30 DIAGNOSIS — R41841 Cognitive communication deficit: Secondary | ICD-10-CM | POA: Diagnosis not present

## 2021-06-30 DIAGNOSIS — M6281 Muscle weakness (generalized): Secondary | ICD-10-CM | POA: Diagnosis not present

## 2021-06-30 DIAGNOSIS — R2681 Unsteadiness on feet: Secondary | ICD-10-CM | POA: Diagnosis not present

## 2021-06-30 DIAGNOSIS — R269 Unspecified abnormalities of gait and mobility: Secondary | ICD-10-CM

## 2021-06-30 DIAGNOSIS — R4701 Aphasia: Secondary | ICD-10-CM | POA: Diagnosis not present

## 2021-06-30 NOTE — Telephone Encounter (Signed)
More trouble falling asleep or staying asleep or both?  Any pain keeping her awake? What time does she usually go to bed and wake up  Any caffeine? Any mood change? (Anxious or depressed)  Thanks

## 2021-06-30 NOTE — Therapy (Signed)
[place] MAIN [facility] SERVICES [address] [place], [place], [address] Phone: [phone]   Fax:  [phone]  Physical Therapy Treatment  Patient Details  Name: Deborah Carey MRN: [id number] Date of Birth: 07-13-1951 No data recorded  Encounter Date: 06/30/2021   PT End of Session - 06/30/21 1025     Visit Number 5    Number of Visits 13    Date for PT Re-Evaluation 07/28/21    Authorization Type [employer]    Authorization Time Period 06/17/21-07/28/21    Progress Note Due on Visit 10    PT Start Time 1018    PT Stop Time 1100    PT Time Calculation (min) 42 min    Equipment Utilized During Treatment Gait belt    Activity Tolerance Patient tolerated treatment well    Behavior During Therapy WFL for tasks assessed/performed             Past Medical History:  Diagnosis Date   Allergy    allergic rhinitis   Anxiety    Cataract    removed both eyes    Infertility, female    Osteoarthritis of both knees    OA    PONV (postoperative nausea and vomiting)    Post-menopausal bleeding    neg endo bx.    Past Surgical History:  Procedure Laterality Date   cataract surgery  2011   CESAREAN SECTION     times 2   COLONOSCOPY  2010   hems    ENDOMETRIAL BIOPSY     neg for CA cells   FACIAL COSMETIC SURGERY  2006   face lift   FOOT SURGERY  1998   rt heel spur removal   IR CT HEAD LTD  05/25/2021   IR PERCUTANEOUS ART THROMBECTOMY/INFUSION INTRACRANIAL INC DIAG ANGIO  05/25/2021   IR US GUIDE VASC ACCESS RIGHT  05/25/2021   RADIOLOGY WITH ANESTHESIA N/A 05/25/2021   Procedure: IR WITH ANESTHESIA;  Surgeon: Aletta Edouard, MD;  Location: [facility];  Service: Radiology;  Laterality: N/A;   REFRACTIVE SURGERY  08/1999    There were no vitals filed for this visit.   Subjective Assessment - 06/30/21 1024     Subjective Patient reports doing well; no pain currently; Denies any new falls/stumbles; Patient reports HEP is going  well. She is currently doing them a little bit every day;    Pertinent History 70 yo Female s/p CVA on 05/25/21; She was admitted to hospital for approximately 1 week and then discharged to inpatient rehab for 1 week. She was discharged home in October 2022. She reports since being home she has been doing well. She presents to therapy without assistive device. She denies using any assistive device at home. Denies any recent falls. She denies any stumbles or unsteadiness. She reports intermittent fatigue requiring rest at times. Pt reports she is not cooking at this time. She also reports she is not cleaning much. She reports trying some activities but fatigues quickly. She reports she does sleep well at night.She denies any numbness/tingling;    Limitations Standing;Walking    How long can you stand comfortably? 10-15 min;    How long can you walk comfortably? approximately 10 min, >500 feet; Does fatigue with long distance walking    Diagnostic tests MRI on 05/26/21: Acute infarction of the left putamen and caudate. Minimal petechial  blood products but no frank hematoma. Mild swelling but no midline  shift.  Three punctate acute infarctions within the medial right frontal  lobe, probably micro embolic infarctions in the anterior cerebral  artery territory.     Old infarction in the right parietal cortical and subcortical brain  and seen affecting the cerebral hemispheric deep white matter  extensively.    Patient Stated Goals improve energy; be able to walk better- faster and longer;    Currently in Pain? No/denies    Pain Onset More than a month ago    Multiple Pain Sites No                         INTERVENTIONS:    Warm up on Crosstrainer, BUE/BLE level 2 x4 min with cues to increase steps per minute >60 for cardiovascular conditioning; Tolerated well;   Therapeutic Exercises:    Standing with green tband around BLE: -hip abduction x12 reps -hip extension x12 reps -side  stepping x10 feet x2 laps each with intermittent rail assist Patient required min VCs for erect posture and to avoid hip ER during abduction for better gluteal strengthening;   Hip flexion march against green tband with toe tap to 10 inch box x10 reps bilaterally, progressed to 2 sets on RLE with moderate difficulty reported; Patient does require BUE rail assist for safety and balance;        Neuro- re-ed:   Standing on 1/2 bolster (flat side up) Feet apart: -heel/toe rock x10 reps -feet apart in neutral, BUE wand flexion x10 reps with min A for safety and cues for increased ankle strategies for better stance control; -finger tip hold on railing:  Mini squat x10 reps, CGA for safety with minimal ROM tolerated; Patient reports slight discomfort in RLE knee with squats, discontinued;  Standing on airex pad: -feet together eyes open x15 sec, no sway, eyes closed: 10 sec, mild sway with lean to left side but able to recover without rail assist; -stepping airex to airex forward/backward unsupported x10 reps with min A for safety and cues for increased step length for better foot clearance; -modified tandem stance (staggered)  Unsupported standing 10 sec hold with CGA for safety  Progressed to cone pass side/side x5 reps each foot in front;  Patient reports moderate difficulty while standing on compliant surface requiring CGA to min A throughout. She exhibits decreased ankle strategies and tendency to shift weight to LLE (strong side);   Patient tolerated session well; She denies any RLE foot discomfort throughout session;                         PT Education - 06/30/21 1025     Education Details LE strengthening/balance;    Person(s) Educated Patient    Methods Explanation;Verbal cues    Comprehension Verbalized understanding;Returned demonstration;Verbal cues required;Need further instruction              PT Short Term Goals - 06/16/21 1053       PT SHORT  TERM GOAL #1   Title Patient will be adherent to HEP at least 3x a week to improve functional strength and balance for better safety at home.    Time 4    Period Weeks    Status New    Target Date 07/14/21               PT Long Term Goals - 06/16/21 1054       PT LONG TERM GOAL #1   Title Patient (>  67 years old) will complete five times sit to stand test in < 15 seconds indicating an increased LE strength and improved balance.    Time 6    Period Weeks    Status New    Target Date 07/28/21      PT LONG TERM GOAL #2   Title Patient will increase RLE gross strength to 4+/5 as to improve functional strength for independent gait, increased standing tolerance and increased ADL ability.    Time 6    Period Weeks    Status New    Target Date 07/28/21      PT LONG TERM GOAL #3   Title Patient will increase six minute walk test distance to >1300 for progression to community ambulator and improve gait ability closer to age group norms of 1644 feet;    Time 6    Period Weeks    Status New    Target Date 07/28/21      PT LONG TERM GOAL #4   Title Patient will ascend/descend 4 stairs without rail assist independently without loss of balance to improve ability to get in/out of home while carrying something.    Time 6    Period Weeks    Status New    Target Date 07/28/21      PT LONG TERM GOAL #5   Title Patient will improve FOTO score by 5% to indicate improved functional mobility with ADLs.    Time 6    Period Weeks    Status New    Target Date 07/28/21      Additional Long Term Goals   Additional Long Term Goals Yes      PT LONG TERM GOAL #6   Title Patient will increase Functional Gait Assessment score to >22/30 as to reduce fall risk and improve dynamic gait safety with community ambulation.    Time 6    Period Weeks    Status New    Target Date 07/28/21                   Plan - 06/30/21 1446     Clinical Impression Statement Patient motivated and  participated well within session. She was instructed in advanced LE strengthening utilizing tband for resistance to improve strength. Patient does require min VCs for proper positioning and exercise technique for optimal muscle activation. She continues to exhibit weakness in RLE compared to LLE. Paient instructed in advanced balance tasks. She did have difficulty standing on compliant surfaces with increased instability noted. Patient does require CGA to min A while on airex pad for safety. She would benefit from additional skilled PT intervention to improve strength, balance and mobility. She has been discharged from speech therapy but would benefit from additional skilled PT Intervention to improve mobility;    Personal Factors and Comorbidities Age;Comorbidity 3+    Comorbidities history of chronic right foot pain, knee OA, hypercholesterolemia, abnormal heart rhythm (on blood thinners), osteopenia,    Examination-Activity Limitations Caring for Others;Squat;Stairs;Stand;Transfers    Examination-Participation Restrictions Cleaning;Community Activity;Shop;Volunteer    Stability/Clinical Decision Making Stable/Uncomplicated    Rehab Potential Good    PT Frequency 2x / week    PT Duration 6 weeks    PT Treatment/Interventions Cryotherapy;Moist Heat;Electrical Stimulation;Gait training;Stair training;Functional mobility training;Therapeutic activities;Therapeutic exercise;Balance training;Neuromuscular re-education;Patient/family education;Energy conservation    PT Next Visit Plan review HEP- high level balance training: assess Right foot for pain as appropriate.    PT Home Exercise Plan 06/28/2021: Access Code: [email or web address]  Consulted and Agree with Plan of Care Patient             Patient will benefit from skilled therapeutic intervention in order to improve the following deficits and impairments:  Abnormal gait, Decreased balance, Decreased endurance, Decreased mobility, Difficulty walking,  Cardiopulmonary status limiting activity, Decreased strength, Decreased safety awareness, Decreased activity tolerance  Visit Diagnosis: Abnormality of gait and mobility  Difficulty in walking, not elsewhere classified  Muscle weakness (generalized)  Unsteadiness on feet     Problem List Patient Active Problem List   Diagnosis Date Noted   Chronic ischemic left MCA stroke 06/18/2021   Cholelithiasis without cholecystitis 06/07/2021   Nodule of apex of right lung 06/07/2021   History of CVA (cerebrovascular accident) 05/25/2021   Atrial fibrillation ([place])    Mass of breast, left 11/13/2020   Colon cancer screening 05/14/2019   Vitamin D deficiency 05/08/2018   Flushing 06/20/2017   H/O fracture of fibula 01/21/2014   Osteopenia 01/07/2014   Family history of osteoporosis 11/29/2013   Estrogen deficiency 11/29/2013   Hyperlipidemia 11/29/2013   Encounter for Medicare annual wellness exam 10/29/2013   Irregular heartbeat 10/07/2013   Left thyroid nodule 09/26/2013   Enlarged thyroid 09/16/2013   Primary osteoarthritis of both knees 11/05/2012   Other screening mammogram 09/27/2011   Routine general medical examination at a health care facility 09/19/2011   Class 1 obesity due to excess calories with serious comorbidity and body mass index (BMI) of 34.0 to 34.9 in adult 06/06/2008   Prediabetes 06/06/2008   Anxiety state 04/17/2007   ALLERGIC RHINITIS 04/17/2007    Mairim Bade, PT, DPT 06/30/2021, 2:52 PM  [place] [facility] MAIN [facility] SERVICES [address] [place], [place], [address] Phone: [phone]   Fax:  [phone]  Name: Deborah Carey MRN: [id number] Date of Birth: Jan 25, 1951

## 2021-06-30 NOTE — Therapy (Signed)
[place] MAIN [facility] SERVICES [address] [place], [place], [address] Phone: [phone]   Fax:  [phone]  Speech Language Pathology Treatment  DISCHARGE SUMMARY   Patient Details  Name: Deborah Carey MRN: [id number] Date of Birth: 1950/10/11 Referring Provider (SLP): Reesa Chew   Encounter Date: 06/30/2021   End of Session - 06/30/21 1220     Visit Number 5    Number of Visits 25    Date for SLP Re-Evaluation 09/03/21    Authorization Type [employer]    Authorization - Visit Number 5    Progress Note Due on Visit 10    SLP Start Time 0900    SLP Stop Time  1000    SLP Time Calculation (min) 60 min    Activity Tolerance Patient tolerated treatment well             Past Medical History:  Diagnosis Date   Allergy    allergic rhinitis   Anxiety    Cataract    removed both eyes    Infertility, female    Osteoarthritis of both knees    OA    PONV (postoperative nausea and vomiting)    Post-menopausal bleeding    neg endo bx.    Past Surgical History:  Procedure Laterality Date   cataract surgery  2011   CESAREAN SECTION     times 2   COLONOSCOPY  2010   hems    ENDOMETRIAL BIOPSY     neg for CA cells   FACIAL COSMETIC SURGERY  2006   face lift   FOOT SURGERY  1998   rt heel spur removal   IR CT HEAD LTD  05/25/2021   IR PERCUTANEOUS ART THROMBECTOMY/INFUSION INTRACRANIAL INC DIAG ANGIO  05/25/2021   IR US GUIDE VASC ACCESS RIGHT  05/25/2021   RADIOLOGY WITH ANESTHESIA N/A 05/25/2021   Procedure: IR WITH ANESTHESIA;  Surgeon: Aletta Edouard, MD;  Location: [place];  Service: Radiology;  Laterality: N/A;   REFRACTIVE SURGERY  08/1999    There were no vitals filed for this visit.   Subjective Assessment - 06/30/21 1217     Subjective pt pleasant    Currently in Pain? No/denies                   ADULT SLP TREATMENT - 06/30/21 0001       Treatment Provided   Treatment provided  Cognitive-Linquistic      Cognitive-Linquistic Treatment   Treatment focused on Aphasia;Patient/family/caregiver education    Skilled Treatment LANGUAGE: independently used word finding strategies to convey higher level complex language              SLP Education - 06/30/21 1220     Education Details completed    Person(s) Educated Patient    Methods Explanation;Demonstration;Verbal cues    Comprehension Verbalized understanding;Returned demonstration              SLP Short Term Goals - 06/30/21 1227       SLP SHORT TERM GOAL #1   Title Pt will name at least 15 items in a named category independently with 100% accuracy.    Status Achieved      SLP SHORT TERM GOAL #2   Title Patient will generate grammatical, fluent, and cogent sentences to complete abstract/complex linguistic task with 80% accuracy.    Status Achieved      SLP SHORT TERM GOAL #3   Title Pt will produce  original, short sentences when given a target word with 85% accuracy and min verbal cues.    Status Achieved              SLP Long Term Goals - 06/30/21 1227       SLP LONG TERM GOAL #1   Title Pt will use functional communication skills for social interaction (e.g., greetings,  social etiquette, and short questions/simple sentences) with both familiar and unfamiliar  partners with 95% success.    Status Achieved              Plan - 06/30/21 1224     Clinical Impression Statement Pt is independent with word finding strategies when communicating abstract complex thoughts. At this time, pt is no longer in needs of skilled ST intervention. All education has been completed.    Consulted and Agree with Plan of Care Patient              Problem List Patient Active Problem List   Diagnosis Date Noted   Chronic ischemic left MCA stroke 06/18/2021   Cholelithiasis without cholecystitis 06/07/2021   Nodule of apex of right lung 06/07/2021   History of CVA (cerebrovascular accident)  05/25/2021   Atrial fibrillation ([place])    Mass of breast, left 11/13/2020   Colon cancer screening 05/14/2019   Vitamin D deficiency 05/08/2018   Flushing 06/20/2017   H/O fracture of fibula 01/21/2014   Osteopenia 01/07/2014   Family history of osteoporosis 11/29/2013   Estrogen deficiency 11/29/2013   Hyperlipidemia 11/29/2013   Encounter for Medicare annual wellness exam 10/29/2013   Irregular heartbeat 10/07/2013   Left thyroid nodule 09/26/2013   Enlarged thyroid 09/16/2013   Primary osteoarthritis of both knees 11/05/2012   Other screening mammogram 09/27/2011   Routine general medical examination at a health care facility 09/19/2011   Class 1 obesity due to excess calories with serious comorbidity and body mass index (BMI) of 34.0 to 34.9 in adult 06/06/2008   Prediabetes 06/06/2008   Anxiety state 04/17/2007   ALLERGIC RHINITIS 04/17/2007   Shailah Gibbins B. Rutherford Nail M.S., CCC-SLP, [place] Pathologist Rehabilitation Services Office [phone]  Stormy Fabian 06/30/2021, 12:28 PM  [place] MAIN [facility] SERVICES [address] [place], [place], [address] Phone: [phone]   Fax:  [phone]   Name: Deborah Carey MRN: [id number] Date of Birth: 1950/12/16

## 2021-06-30 NOTE — Patient Instructions (Signed)
Continue reading, engaging in social activities

## 2021-06-30 NOTE — Telephone Encounter (Signed)
Pt called in stating she has trouble sleeping and wants medication for it

## 2021-07-01 NOTE — Telephone Encounter (Signed)
Left message for call back.

## 2021-07-02 ENCOUNTER — Ambulatory Visit: Payer: Medicare PPO

## 2021-07-02 ENCOUNTER — Encounter: Payer: Medicare PPO | Admitting: Speech Pathology

## 2021-07-02 MED ORDER — TRAZODONE HCL 50 MG PO TABS: 25.0000 mg | ORAL_TABLET | Freq: Every evening | ORAL | 3 refills | Status: DC | PRN

## 2021-07-02 NOTE — Telephone Encounter (Signed)
I pended trazodone to try 1/2 to 1 pill at bedtime   Watch for over sedation/dizziness and use care not to fall  If any side effects including mood change, let me know

## 2021-07-02 NOTE — Telephone Encounter (Signed)
Pt called back   -She is having trouble falling asleep only -Sometimes pain keeps her from falling asleep  -Bedtime is around 10pm -Pt said that she stops having any caffeine around 6:30pm -No Mood changes

## 2021-07-07 ENCOUNTER — Ambulatory Visit: Payer: Medicare PPO

## 2021-07-07 ENCOUNTER — Ambulatory Visit: Payer: Medicare PPO | Admitting: Speech Pathology

## 2021-07-07 ENCOUNTER — Encounter: Payer: Medicare PPO | Admitting: Occupational Therapy

## 2021-07-09 ENCOUNTER — Other Ambulatory Visit: Payer: Self-pay

## 2021-07-09 ENCOUNTER — Ambulatory Visit: Payer: Medicare PPO | Admitting: Speech Pathology

## 2021-07-09 ENCOUNTER — Ambulatory Visit: Payer: Medicare PPO | Attending: Physical Medicine and Rehabilitation

## 2021-07-09 DIAGNOSIS — R2681 Unsteadiness on feet: Secondary | ICD-10-CM

## 2021-07-09 DIAGNOSIS — R2689 Other abnormalities of gait and mobility: Secondary | ICD-10-CM | POA: Diagnosis not present

## 2021-07-09 DIAGNOSIS — R269 Unspecified abnormalities of gait and mobility: Secondary | ICD-10-CM | POA: Diagnosis not present

## 2021-07-09 DIAGNOSIS — R296 Repeated falls: Secondary | ICD-10-CM | POA: Insufficient documentation

## 2021-07-09 DIAGNOSIS — M6281 Muscle weakness (generalized): Secondary | ICD-10-CM

## 2021-07-09 DIAGNOSIS — R262 Difficulty in walking, not elsewhere classified: Secondary | ICD-10-CM | POA: Insufficient documentation

## 2021-07-09 NOTE — Therapy (Signed)
[place] MAIN [facility] SERVICES [address] [place], [place], [address] Phone: [phone]   Fax:  [phone]  Physical Therapy Treatment  Patient Details  Name: Deborah Carey MRN: [id number] Date of Birth: [birth year] No data recorded  Encounter Date: 07/09/2021   PT End of Session - 07/09/21 1118     Visit Number 6    Number of Visits 13    Date for PT Re-Evaluation 07/28/21    Authorization Type [employer]    Authorization Time Period 06/17/21-07/28/21    Progress Note Due on Visit 10    PT Start Time 0849    PT Stop Time 0929    PT Time Calculation (min) 40 min    Equipment Utilized During Treatment Gait belt    Activity Tolerance Patient tolerated treatment well    Behavior During Therapy WFL for tasks assessed/performed             Past Medical History:  Diagnosis Date   Allergy    allergic rhinitis   Anxiety    Cataract    removed both eyes    Infertility, female    Osteoarthritis of both knees    OA    PONV (postoperative nausea and vomiting)    Post-menopausal bleeding    neg endo bx.    Past Surgical History:  Procedure Laterality Date   cataract surgery  2011   CESAREAN SECTION     times 2   COLONOSCOPY  2010   hems    ENDOMETRIAL BIOPSY     neg for CA cells   FACIAL COSMETIC SURGERY  2006   face lift   FOOT SURGERY  1998   rt heel spur removal   IR CT HEAD LTD  05/25/2021   IR PERCUTANEOUS ART THROMBECTOMY/INFUSION INTRACRANIAL INC DIAG ANGIO  05/25/2021   IR US GUIDE VASC ACCESS RIGHT  05/25/2021   RADIOLOGY WITH ANESTHESIA N/A 05/25/2021   Procedure: IR WITH ANESTHESIA;  Surgeon: Aletta Edouard, MD;  Location: [place];  Service: Radiology;  Laterality: N/A;   REFRACTIVE SURGERY  08/1999    There were no vitals filed for this visit.   Subjective Assessment - 07/09/21 0853     Subjective Pt reports doing well today. She reports no pain at rest but some right knee discomfort when walking and  completing exercises. Pt denies falls or stumbles.    Patient is accompained by: Family member   Deborah Carey   Pertinent History 70 yo Female s/p CVA on 05/25/21; She was admitted to hospital for approximately 1 week and then discharged to inpatient rehab for 1 week. She was discharged home in October 2022. She reports since being home she has been doing well. She presents to therapy without assistive device. She denies using any assistive device at home. Denies any recent falls. She denies any stumbles or unsteadiness. She reports intermittent fatigue requiring rest at times. Pt reports she is not cooking at this time. She also reports she is not cleaning much. She reports trying some activities but fatigues quickly. She reports she does sleep well at night.She denies any numbness/tingling;    Limitations Standing;Walking    How long can you sit comfortably? NA    How long can you stand comfortably? 10-15 min;    How long can you walk comfortably? approximately 10 min, >500 feet; Does fatigue with long distance walking    Diagnostic tests MRI on 05/26/21: Acute infarction of the left putamen and caudate.  Minimal petechial  blood products but no frank hematoma. Mild swelling but no midline  shift.     Three punctate acute infarctions within the medial right frontal  lobe, probably micro embolic infarctions in the anterior cerebral  artery territory.     Old infarction in the right parietal cortical and subcortical brain  and seen affecting the cerebral hemispheric deep white matter  extensively.    Patient Stated Goals improve energy; be able to walk better- faster and longer;    Currently in Pain? Yes    Pain Location Knee    Pain Orientation Right;Anterior    Pain Type Acute pain    Pain Onset More than a month ago    Pain Frequency Intermittent            Neuromuscular Re-ed: CGA provided throughout unless otherwise indicated  Standing on airex pad:  NBOS eyes open x 30 sec, no sway  NBOS eyes  closed: 4 x 30 sec, mild M/L sway with intermittent UE assist. Pt improves with reps and perceives as hard  Semi-tandem stance eyes open 2 x 30 sec each side. Pt perceives as medium and presents with mild M/L sway  WBOS ankle rocker 2 x 15. Pt progresses from BUE support to RUE support to without UE support  WBOS with horizontal and vertical head turns x 10 for each and each direction without UE support.  One foot on airex, one foot in front on 6 in step, 2 x 30 sec each side, with some lateral sway.  On flat surface:  SLS 2x 30 sec each side with 3 finger UE support and intermittent LE touch down to regain balance. Pt reports some increased pain on her right knee.   Therex:  STS with BUE support x 10 rated as easy, progressed to without UE x 3, then regressed to Single UE x 3. VC for scooting to the edge of the chair and using momentum to stand/ nose over toes  Standing hip abduction x 15 reps each side, progressed to 2.5 lb ankle weights x 12  Standing hip extension 2 x 12 reps with 2.5 lb ankle weights  Step ups with on 6 in step with 2.5 lb ankle weights x 10. VC for increased dorsiflexion and hip flexion to improve foot clearance on the right foot as pt tendency to catch R foot on step.    Pt educated throughout session about proper posture and technique with exercises. Improved exercise technique, movement at target joints, use of target muscles after min to mod verbal, visual, tactile cues.      PT Education - 07/09/21 1117     Education Details exercise technique, body mechanics    Person(s) Educated Patient    Methods Explanation;Demonstration;Verbal cues;Tactile cues    Comprehension Verbalized understanding;Returned demonstration;Verbal cues required;Need further instruction              PT Short Term Goals - 06/16/21 1053       PT SHORT TERM GOAL #1   Title Patient will be adherent to HEP at least 3x a week to improve functional strength and balance for  better safety at home.    Time 4    Period Weeks    Status New    Target Date 07/14/21               PT Long Term Goals - 06/16/21 1054       PT LONG TERM GOAL #1   Title Patient (>  20 years old) will complete five times sit to stand test in < 15 seconds indicating an increased LE strength and improved balance.    Time 6    Period Weeks    Status New    Target Date 07/28/21      PT LONG TERM GOAL #2   Title Patient will increase RLE gross strength to 4+/5 as to improve functional strength for independent gait, increased standing tolerance and increased ADL ability.    Time 6    Period Weeks    Status New    Target Date 07/28/21      PT LONG TERM GOAL #3   Title Patient will increase six minute walk test distance to >1300 for progression to community ambulator and improve gait ability closer to age group norms of 1644 feet;    Time 6    Period Weeks    Status New    Target Date 07/28/21      PT LONG TERM GOAL #4   Title Patient will ascend/descend 4 stairs without rail assist independently without loss of balance to improve ability to get in/out of home while carrying something.    Time 6    Period Weeks    Status New    Target Date 07/28/21      PT LONG TERM GOAL #5   Title Patient will improve FOTO score by 5% to indicate improved functional mobility with ADLs.    Time 6    Period Weeks    Status New    Target Date 07/28/21      Additional Long Term Goals   Additional Long Term Goals Yes      PT LONG TERM GOAL #6   Title Patient will increase Functional Gait Assessment score to >22/30 as to reduce fall risk and improve dynamic gait safety with community ambulation.    Time 6    Period Weeks    Status New    Target Date 07/28/21                   Plan - 07/09/21 1121     Clinical Impression Statement Pt exhibits excellent motivation throughout session. She did present to appointment with reports of intermittent knee pain with walking, but did  not have increased pain with interventions today. Pt was most challenged with clearing R foot over 6" step, with tendency to catch foot on step. Pt was able to improve foot clearance with cuing. The pt will benefit from further skilled PT to improve mobility, strength and balance to increase ease and safety with ADLs.    Personal Factors and Comorbidities Age;Comorbidity 3+    Comorbidities history of chronic right foot pain, knee OA, hypercholesterolemia, abnormal heart rhythm (on blood thinners), osteopenia,    Examination-Activity Limitations Caring for Others;Squat;Stairs;Stand;Transfers    Examination-Participation Restrictions Cleaning;Community Activity;Shop;Volunteer    Stability/Clinical Decision Making Stable/Uncomplicated    Rehab Potential Good    PT Frequency 2x / week    PT Duration 6 weeks    PT Treatment/Interventions Cryotherapy;Moist Heat;Electrical Stimulation;Gait training;Stair training;Functional mobility training;Therapeutic activities;Therapeutic exercise;Balance training;Neuromuscular re-education;Patient/family education;Energy conservation    PT Next Visit Plan review HEP- high level balance training: assess Right foot for pain as appropriate.    PT Home Exercise Plan 06/28/2021: Access Code: [email or web address]    Consulted and Agree with Plan of Care Patient             Patient will benefit from skilled therapeutic intervention in order to  improve the following deficits and impairments:  Abnormal gait, Decreased balance, Decreased endurance, Decreased mobility, Difficulty walking, Cardiopulmonary status limiting activity, Decreased strength, Decreased safety awareness, Decreased activity tolerance  Visit Diagnosis: Unsteadiness on feet  Muscle weakness (generalized)  Other abnormalities of gait and mobility     Problem List Patient Active Problem List   Diagnosis Date Noted   Chronic ischemic left MCA stroke 06/18/2021   Cholelithiasis without cholecystitis  06/07/2021   Nodule of apex of right lung 06/07/2021   History of CVA (cerebrovascular accident) 05/25/2021   Atrial fibrillation ([place])    Mass of breast, left 11/13/2020   Colon cancer screening 05/14/2019   Vitamin D deficiency 05/08/2018   Flushing 06/20/2017   H/O fracture of fibula 01/21/2014   Osteopenia 01/07/2014   Family history of osteoporosis 11/29/2013   Estrogen deficiency 11/29/2013   Hyperlipidemia 11/29/2013   Encounter for Medicare annual wellness exam 10/29/2013   Irregular heartbeat 10/07/2013   Left thyroid nodule 09/26/2013   Enlarged thyroid 09/16/2013   Primary osteoarthritis of both knees 11/05/2012   Other screening mammogram 09/27/2011   Routine general medical examination at a health care facility 09/19/2011   Class 1 obesity due to excess calories with serious comorbidity and body mass index (BMI) of 34.0 to 34.9 in adult 06/06/2008   Prediabetes 06/06/2008   Anxiety state 04/17/2007   ALLERGIC RHINITIS 04/17/2007    Zollie Pee, PT 07/09/2021, 11:25 AM  [place] [address] [place], [place], [address] Phone: [phone]   Fax:  [phone]  Name: LINCOLN KLEINER MRN: [id number] Date of Birth: 1951-05-13

## 2021-07-13 ENCOUNTER — Other Ambulatory Visit: Payer: Self-pay

## 2021-07-13 ENCOUNTER — Ambulatory Visit: Payer: Medicare PPO

## 2021-07-13 ENCOUNTER — Encounter: Payer: Medicare PPO | Admitting: Speech Pathology

## 2021-07-13 DIAGNOSIS — R2689 Other abnormalities of gait and mobility: Secondary | ICD-10-CM

## 2021-07-13 DIAGNOSIS — R269 Unspecified abnormalities of gait and mobility: Secondary | ICD-10-CM | POA: Diagnosis not present

## 2021-07-13 DIAGNOSIS — R296 Repeated falls: Secondary | ICD-10-CM | POA: Diagnosis not present

## 2021-07-13 DIAGNOSIS — R262 Difficulty in walking, not elsewhere classified: Secondary | ICD-10-CM | POA: Diagnosis not present

## 2021-07-13 DIAGNOSIS — M6281 Muscle weakness (generalized): Secondary | ICD-10-CM | POA: Diagnosis not present

## 2021-07-13 DIAGNOSIS — R2681 Unsteadiness on feet: Secondary | ICD-10-CM

## 2021-07-13 NOTE — Therapy (Addendum)
[place] MAIN [facility] SERVICES [address] [place], [place], [address] Phone: [phone]   Fax:  [phone]  Physical Therapy Treatment  Patient Details  Name: Deborah Carey MRN: [id number] Date of Birth: [birth year] No data recorded  Encounter Date: 07/13/2021   PT End of Session - 07/13/21 1129     Visit Number 7    Number of Visits 13    Date for PT Re-Evaluation 07/28/21    Authorization Type [employer]    Authorization Time Period 06/17/21-07/28/21    Progress Note Due on Visit 10    PT Start Time 1019    PT Stop Time 1101    PT Time Calculation (min) 42 min    Equipment Utilized During Treatment Gait belt    Activity Tolerance Patient tolerated treatment well    Behavior During Therapy WFL for tasks assessed/performed             Past Medical History:  Diagnosis Date   Allergy    allergic rhinitis   Anxiety    Cataract    removed both eyes    Infertility, female    Osteoarthritis of both knees    OA    PONV (postoperative nausea and vomiting)    Post-menopausal bleeding    neg endo bx.    Past Surgical History:  Procedure Laterality Date   cataract surgery  2011   CESAREAN SECTION     times 2   COLONOSCOPY  2010   hems    ENDOMETRIAL BIOPSY     neg for CA cells   FACIAL COSMETIC SURGERY  2006   face lift   FOOT SURGERY  1998   rt heel spur removal   IR CT HEAD LTD  05/25/2021   IR PERCUTANEOUS ART THROMBECTOMY/INFUSION INTRACRANIAL INC DIAG ANGIO  05/25/2021   IR US GUIDE VASC ACCESS RIGHT  05/25/2021   RADIOLOGY WITH ANESTHESIA N/A 05/25/2021   Procedure: IR WITH ANESTHESIA;  Surgeon: Aletta Edouard, MD;  Location: [place];  Service: Radiology;  Laterality: N/A;   REFRACTIVE SURGERY  08/1999    There were no vitals filed for this visit.   Subjective Assessment - 07/13/21 1023     Subjective Pt reports 2 falls in the last few days, one getting into bed in which her spouse helped her off the ground.  The second happened at the refrigerator when she dropped some blueberries and was trying to pick them up from the floor and lost her balance. Pt reports that she needed help to get off the floor, but had no injuries from either fall. Pt denies pain today. She has not been able to complete her exercises    Patient is accompained by: Deborah Carey   Pertinent History 70 yo Female s/p CVA on 05/25/21; She was admitted to hospital for approximately 1 week and then discharged to inpatient rehab for 1 week. She was discharged home in October 2022. She reports since being home she has been doing well. She presents to therapy without assistive device. She denies using any assistive device at home. Denies any recent falls. She denies any stumbles or unsteadiness. She reports intermittent fatigue requiring rest at times. Pt reports she is not cooking at this time. She also reports she is not cleaning much. She reports trying some activities but fatigues quickly. She reports she does sleep well at night.She denies any numbness/tingling;    Limitations Standing;Walking    How long can  you sit comfortably? NA    How long can you stand comfortably? 10-15 min;    How long can you walk comfortably? approximately 10 min, >500 feet; Does fatigue with long distance walking    Diagnostic tests MRI on 05/26/21: Acute infarction of the left putamen and caudate. Minimal petechial  blood products but no frank hematoma. Mild swelling but no midline  shift.     Three punctate acute infarctions within the medial right frontal  lobe, probably micro embolic infarctions in the anterior cerebral  artery territory.     Old infarction in the right parietal cortical and subcortical brain  and seen affecting the cerebral hemispheric deep white matter  extensively.    Patient Stated Goals improve energy; be able to walk better- faster and longer;    Currently in Pain? No/denies    Pain Onset More than a month ago               INTERVENTION   Therex:   STS 2 x 5 without UE support. Pt reports pain in her right knee 4/10. Perceived as hard.  Standing hip abduction x 12 on right x 15 on left with 2.5 lb ankle weights. VC to stand up straight and minimize trunk lean.  Standing hip extension x 12 on right x 15 on left with 2.5 lb ankle weights.  Step ups on 6 in step without UE support x 10. VC on increasing hip flexion and ankle dorsiflexion to improve foot clearance.  Toe taps on 6 in step x 12 each side without UE support. Continued cuing on increased hip flexion and ankle dorsiflexion to improve foot clearance.  Neuromuscular Re-ed: CGA provided throughout unless otherwise indicated  On airex:  NBOS EO x 30 sec, no sway  NBOS EC 3 x 30 sec, moderate lateral sway, pt perceives as medium difficulty  WBOS EO reaching down for cones both sides x 10. Pt reported mild difficulty with no indications of LOB  Semi-tandem (staggered) stance EO 2 x 30 sec each side. Pt perceived as easy  Semi-tandem (staggered) stance EC 2 x 30 sec each side. Pt perceived as medium difficulty. Pt states she feels like she is losing balance toward the right side.  WBOS ankle rocker x 15 without UE support  NBOS horizontal head turns 2 x 10 reps. Pt perceived as medium difficulty Left foot on 6 in step, right foot on airex 2 x 30 sec with cuing to bear weight primarily through the right.  HEP Pt instructed to perform at support surface with chair behind her. Pt verbalized understanding.  Access Code: [email or web address] URL: [email or web address] Date: 07/13/2021 Prepared by: Ricard Dillon  Exercises  Standing Romberg to 1/2 Tandem Stance - 1 x daily - 7 x weekly - 2 sets - 2 reps - 30 hold        PT Education - 07/13/21 1128     Education Details exercise technique, body mechanics, HEP    Person(s) Educated Patient    Methods Explanation;Demonstration;Verbal cues;Tactile cues;Handout    Comprehension  Verbalized understanding;Returned demonstration;Verbal cues required;Need further instruction              PT Short Term Goals - 06/16/21 1053       PT SHORT TERM GOAL #1   Title Patient will be adherent to HEP at least 3x a week to improve functional strength and balance for better safety at home.    Time 4    Period Weeks  Status New    Target Date 07/14/21               PT Long Term Goals - 06/16/21 1054       PT LONG TERM GOAL #1   Title Patient (> 26 years old) will complete five times sit to stand test in < 15 seconds indicating an increased LE strength and improved balance.    Time 6    Period Weeks    Status New    Target Date 07/28/21      PT LONG TERM GOAL #2   Title Patient will increase RLE gross strength to 4+/5 as to improve functional strength for independent gait, increased standing tolerance and increased ADL ability.    Time 6    Period Weeks    Status New    Target Date 07/28/21      PT LONG TERM GOAL #3   Title Patient will increase six minute walk test distance to >1300 for progression to community ambulator and improve gait ability closer to age group norms of 1644 feet;    Time 6    Period Weeks    Status New    Target Date 07/28/21      PT LONG TERM GOAL #4   Title Patient will ascend/descend 4 stairs without rail assist independently without loss of balance to improve ability to get in/out of home while carrying something.    Time 6    Period Weeks    Status New    Target Date 07/28/21      PT LONG TERM GOAL #5   Title Patient will improve FOTO score by 5% to indicate improved functional mobility with ADLs.    Time 6    Period Weeks    Status New    Target Date 07/28/21      Additional Long Term Goals   Additional Long Term Goals Yes      PT LONG TERM GOAL #6   Title Patient will increase Functional Gait Assessment score to >22/30 as to reduce fall risk and improve dynamic gait safety with community ambulation.    Time 6     Period Weeks    Status New    Target Date 07/28/21                   Plan - 07/13/21 1129     Clinical Impression Statement Pt shows good motivation throughout session. She reports continued intermittent right knee pain but does not exceed 4/10. Pt tolerates strengthening and balance exercise well and utilizes occasional rest breaks. Pt shows improved right foot clearance on the 6" step following step ups and toe taps. Pt reports requiring help getting up from the floor following her previous falls and would benefit from floor to chair and floor to stand transfers. The pt will benefit from further skilled PT to improve mobility, strength, and balance to increase ease and safety of ADLs.    Personal Factors and Comorbidities Age;Comorbidity 3+    Comorbidities history of chronic right foot pain, knee OA, hypercholesterolemia, abnormal heart rhythm (on blood thinners), osteopenia,    Examination-Activity Limitations Caring for Others;Squat;Stairs;Stand;Transfers    Examination-Participation Restrictions Cleaning;Community Activity;Shop;Volunteer    Stability/Clinical Decision Making Stable/Uncomplicated    Rehab Potential Good    PT Frequency 2x / week    PT Duration 6 weeks    PT Treatment/Interventions Cryotherapy;Moist Heat;Electrical Stimulation;Gait training;Stair training;Functional mobility training;Therapeutic activities;Therapeutic exercise;Balance training;Neuromuscular re-education;Patient/family education;Energy conservation  PT Next Visit Plan review HEP- high level balance training: assess Right foot for pain as appropriate. Floor to chair transfers.    PT Home Exercise Plan 06/28/2021: Access Code: [email or web address]    Consulted and Agree with Plan of Care Patient             Patient will benefit from skilled therapeutic intervention in order to improve the following deficits and impairments:  Abnormal gait, Decreased balance, Decreased endurance, Decreased mobility,  Difficulty walking, Cardiopulmonary status limiting activity, Decreased strength, Decreased safety awareness, Decreased activity tolerance  Visit Diagnosis: Unsteadiness on feet  Muscle weakness (generalized)  Other abnormalities of gait and mobility     Problem List Patient Active Problem List   Diagnosis Date Noted   Chronic ischemic left MCA stroke 06/18/2021   Cholelithiasis without cholecystitis 06/07/2021   Nodule of apex of right lung 06/07/2021   History of CVA (cerebrovascular accident) 05/25/2021   Atrial fibrillation ([place])    Mass of breast, left 11/13/2020   Colon cancer screening 05/14/2019   Vitamin D deficiency 05/08/2018   Flushing 06/20/2017   H/O fracture of fibula 01/21/2014   Osteopenia 01/07/2014   Family history of osteoporosis 11/29/2013   Estrogen deficiency 11/29/2013   Hyperlipidemia 11/29/2013   Encounter for Medicare annual wellness exam 10/29/2013   Irregular heartbeat 10/07/2013   Left thyroid nodule 09/26/2013   Enlarged thyroid 09/16/2013   Primary osteoarthritis of both knees 11/05/2012   Other screening mammogram 09/27/2011   Routine general medical examination at a health care facility 09/19/2011   Class 1 obesity due to excess calories with serious comorbidity and body mass index (BMI) of 34.0 to 34.9 in adult 06/06/2008   Prediabetes 06/06/2008   Anxiety state 04/17/2007   ALLERGIC RHINITIS 04/17/2007   Ricard Dillon PT, DPT This entire session was performed under direct supervision and direction of a licensed therapist/therapist assistant . I have personally read, edited and approve of the note as written.   Deborah Carey, SPT 07/13/2021, 4:53 PM  [place] MAIN [facility] SERVICES [address] [place], [place], [address] Phone: [phone]   Fax:  [phone]  Name: NICKI FURLAN MRN: [id number] Date of Birth: [birth year]

## 2021-07-15 ENCOUNTER — Telehealth: Payer: Self-pay | Admitting: *Deleted

## 2021-07-15 DIAGNOSIS — E7800 Pure hypercholesterolemia, unspecified: Secondary | ICD-10-CM

## 2021-07-15 NOTE — Telephone Encounter (Signed)
Please place future orders for lab appt.  

## 2021-07-16 ENCOUNTER — Encounter: Payer: Medicare PPO | Admitting: Speech Pathology

## 2021-07-16 ENCOUNTER — Other Ambulatory Visit: Payer: Self-pay

## 2021-07-16 ENCOUNTER — Ambulatory Visit: Payer: Medicare PPO

## 2021-07-16 DIAGNOSIS — R296 Repeated falls: Secondary | ICD-10-CM | POA: Diagnosis not present

## 2021-07-16 DIAGNOSIS — R269 Unspecified abnormalities of gait and mobility: Secondary | ICD-10-CM | POA: Diagnosis not present

## 2021-07-16 DIAGNOSIS — R2681 Unsteadiness on feet: Secondary | ICD-10-CM | POA: Diagnosis not present

## 2021-07-16 DIAGNOSIS — R262 Difficulty in walking, not elsewhere classified: Secondary | ICD-10-CM | POA: Diagnosis not present

## 2021-07-16 DIAGNOSIS — R2689 Other abnormalities of gait and mobility: Secondary | ICD-10-CM

## 2021-07-16 DIAGNOSIS — M6281 Muscle weakness (generalized): Secondary | ICD-10-CM | POA: Diagnosis not present

## 2021-07-16 NOTE — Therapy (Addendum)
[place] MAIN [facility] SERVICES [address] [place], [place], [address] Phone: [phone]   Fax:  [phone]  Physical Therapy Treatment  Patient Details  Name: Deborah Carey MRN: [id number] Date of Birth: [birth year] No data recorded  Encounter Date: 07/16/2021   PT End of Session - 07/16/21 0944     Visit Number 8    Number of Visits 13    Date for PT Re-Evaluation 07/28/21    Authorization Type [employer]    Authorization Time Period 06/17/21-07/28/21    Progress Note Due on Visit 10    PT Start Time 0849    PT Stop Time 0931    PT Time Calculation (min) 42 min    Equipment Utilized During Treatment Gait belt    Activity Tolerance Patient tolerated treatment well    Behavior During Therapy WFL for tasks assessed/performed             Past Medical History:  Diagnosis Date   Allergy    allergic rhinitis   Anxiety    Cataract    removed both eyes    Infertility, female    Osteoarthritis of both knees    OA    PONV (postoperative nausea and vomiting)    Post-menopausal bleeding    neg endo bx.    Past Surgical History:  Procedure Laterality Date   cataract surgery  2011   CESAREAN SECTION     times 2   COLONOSCOPY  2010   hems    ENDOMETRIAL BIOPSY     neg for CA cells   FACIAL COSMETIC SURGERY  2006   face lift   FOOT SURGERY  1998   rt heel spur removal   IR CT HEAD LTD  05/25/2021   IR PERCUTANEOUS ART THROMBECTOMY/INFUSION INTRACRANIAL INC DIAG ANGIO  05/25/2021   IR US GUIDE VASC ACCESS RIGHT  05/25/2021   RADIOLOGY WITH ANESTHESIA N/A 05/25/2021   Procedure: IR WITH ANESTHESIA;  Surgeon: Aletta Edouard, MD;  Location: [place];  Service: Radiology;  Laterality: N/A;   REFRACTIVE SURGERY  08/1999    There were no vitals filed for this visit.   Subjective Assessment - 07/16/21 0850     Subjective Pt reports there have been no major updates. She denies any falls and any pain today.    Patient is  accompained by: [name]   Pertinent History 70 yo Female s/p CVA on 05/25/21; She was admitted to hospital for approximately 1 week and then discharged to inpatient rehab for 1 week. She was discharged home in October 2022. She reports since being home she has been doing well. She presents to therapy without assistive device. She denies using any assistive device at home. Denies any recent falls. She denies any stumbles or unsteadiness. She reports intermittent fatigue requiring rest at times. Pt reports she is not cooking at this time. She also reports she is not cleaning much. She reports trying some activities but fatigues quickly. She reports she does sleep well at night.She denies any numbness/tingling;    Limitations Standing;Walking    How long can you sit comfortably? NA    How long can you stand comfortably? 10-15 min;    How long can you walk comfortably? approximately 10 min, >500 feet; Does fatigue with long distance walking    Diagnostic tests MRI on 05/26/21: Acute infarction of the left putamen and caudate. Minimal petechial  blood products but no frank hematoma. Mild swelling  but no midline  shift.     Three punctate acute infarctions within the medial right frontal  lobe, probably micro embolic infarctions in the anterior cerebral  artery territory.     Old infarction in the right parietal cortical and subcortical brain  and seen affecting the cerebral hemispheric deep white matter  extensively.    Patient Stated Goals improve energy; be able to walk better- faster and longer;    Pain Onset More than a month ago              Therapeutic Activity:  STS x 10 with BUE support  Floor to chair transfer training x ~21 minutes from the red mat to chair without armrest to simulate home setup (min/mod assist +2). Pt educated on scooting toward the chair, rolling into a tall kneel, bringing the left stronger knee forward as the primary stance leg, and using the arms to help pull to the  chair. Pt had the most difficulty with rolling to tall kneeling requiring ModA but only requiring MinA transitioning from tall kneeling to sitting.     Neuromuscular Re-ed: CGA provided throughout unless otherwise indicated   On airex:   WBOS EO reaching down to tap 4 cones x multiple repetitions. Cones place 2 to the side and 2 in front. SPT called out colors for pt to tap. - Progressed from 1 at a time to a sequence of 4 - Progressed to NBOS Pt reported mild difficulty with no indications of LOB   Semi-tandem (staggered) stance EO 2 x 30 sec each side. Mild sway improved by additional trial.   Semi-tandem (staggered) stance EC 2 x 30 sec each side. Pt perceived as medium difficulty. Pt requiring MinA and utilizes occasional UE support to regain balance   WBOS ankle rocker x 15      Pt educated throughout session about proper posture and technique with exercises. Improved exercise technique, movement at target joints, use of target muscles after min to mod verbal, visual, tactile cues.   PT Education - 07/16/21 0943     Education Details exercise technique, body mechanics, floor to chair transfer education    Person(s) Educated Patient    Methods Explanation;Demonstration;Verbal cues;Tactile cues    Comprehension Verbalized understanding;Returned demonstration;Verbal cues required              PT Short Term Goals - 06/16/21 1053       PT SHORT TERM GOAL #1   Title Patient will be adherent to HEP at least 3x a week to improve functional strength and balance for better safety at home.    Time 4    Period Weeks    Status New    Target Date 07/14/21               PT Long Term Goals - 06/16/21 1054       PT LONG TERM GOAL #1   Title Patient (> 54 years old) will complete five times sit to stand test in < 15 seconds indicating an increased LE strength and improved balance.    Time 6    Period Weeks    Status New    Target Date 07/28/21      PT LONG TERM GOAL #2    Title Patient will increase RLE gross strength to 4+/5 as to improve functional strength for independent gait, increased standing tolerance and increased ADL ability.    Time 6    Period Weeks    Status New  Target Date 07/28/21      PT LONG TERM GOAL #3   Title Patient will increase six minute walk test distance to >1300 for progression to community ambulator and improve gait ability closer to age group norms of 1644 feet;    Time 6    Period Weeks    Status New    Target Date 07/28/21      PT LONG TERM GOAL #4   Title Patient will ascend/descend 4 stairs without rail assist independently without loss of balance to improve ability to get in/out of home while carrying something.    Time 6    Period Weeks    Status New    Target Date 07/28/21      PT LONG TERM GOAL #5   Title Patient will improve FOTO score by 5% to indicate improved functional mobility with ADLs.    Time 6    Period Weeks    Status New    Target Date 07/28/21      Additional Long Term Goals   Additional Long Term Goals Yes      PT LONG TERM GOAL #6   Title Patient will increase Functional Gait Assessment score to >22/30 as to reduce fall risk and improve dynamic gait safety with community ambulation.    Time 6    Period Weeks    Status New    Target Date 07/28/21                   Plan - 07/16/21 0948     Clinical Impression Statement Patient exhibits good motivation throughout session. She was challenged by the floor to chair transfer and had the most diffculty rolling to a tall kneeling position. She excelled transitioning from tall kneeling to sitting, requiring only MinA. She continued to progress balance exercise and shows improvements in ankle strategy. The pt will benefit from further skilled PT to improve mobility, strength, and balance to increase ease and safety of ADLs.    Personal Factors and Comorbidities Age;Comorbidity 3+    Comorbidities history of chronic right foot pain, knee  OA, hypercholesterolemia, abnormal heart rhythm (on blood thinners), osteopenia,    Examination-Activity Limitations Caring for Others;Squat;Stairs;Stand;Transfers    Examination-Participation Restrictions Cleaning;Community Activity;Shop;Volunteer    Stability/Clinical Decision Making Stable/Uncomplicated    Rehab Potential Good    PT Frequency 2x / week    PT Duration 6 weeks    PT Treatment/Interventions Cryotherapy;Moist Heat;Electrical Stimulation;Gait training;Stair training;Functional mobility training;Therapeutic activities;Therapeutic exercise;Balance training;Neuromuscular re-education;Patient/family education;Energy conservation    PT Next Visit Plan review HEP- high level balance training: assess Right foot for pain as appropriate. Floor to chair transfers.    PT Home Exercise Plan 06/28/2021: Access Code: [email or web address]    Consulted and Agree with Plan of Care Patient             Patient will benefit from skilled therapeutic intervention in order to improve the following deficits and impairments:  Abnormal gait, Decreased balance, Decreased endurance, Decreased mobility, Difficulty walking, Cardiopulmonary status limiting activity, Decreased strength, Decreased safety awareness, Decreased activity tolerance  Visit Diagnosis: Unsteadiness on feet  Muscle weakness (generalized)  Other abnormalities of gait and mobility     Problem List Patient Active Problem List   Diagnosis Date Noted   Chronic ischemic left MCA stroke 06/18/2021   Cholelithiasis without cholecystitis 06/07/2021   Nodule of apex of right lung 06/07/2021   History of CVA (cerebrovascular accident) 05/25/2021   Atrial fibrillation ([place])  Mass of breast, left 11/13/2020   Colon cancer screening 05/14/2019   Vitamin D deficiency 05/08/2018   Flushing 06/20/2017   H/O fracture of fibula 01/21/2014   Osteopenia 01/07/2014   Family history of osteoporosis 11/29/2013   Estrogen deficiency 11/29/2013    Hyperlipidemia 11/29/2013   Encounter for Medicare annual wellness exam 10/29/2013   Irregular heartbeat 10/07/2013   Left thyroid nodule 09/26/2013   Enlarged thyroid 09/16/2013   Primary osteoarthritis of both knees 11/05/2012   Other screening mammogram 09/27/2011   Routine general medical examination at a health care facility 09/19/2011   Class 1 obesity due to excess calories with serious comorbidity and body mass index (BMI) of 34.0 to 34.9 in adult 06/06/2008   Prediabetes 06/06/2008   Anxiety state 04/17/2007   ALLERGIC RHINITIS 04/17/2007   Karn Cassis, SPT This entire session was performed under direct supervision and direction of a licensed therapist/therapist assistant . I have personally read, edited and approve of the note as written.   Ollen Bowl, PT 07/16/2021, 10:17 AM  [place] MAIN [facility] SERVICES [address] [place], [place], [address] Phone: [phone]   Fax:  [phone]  Name: ORIYA KETTERING MRN: [id number] Date of Birth: 03-24-1951

## 2021-07-19 ENCOUNTER — Ambulatory Visit: Payer: Medicare PPO | Admitting: Cardiology

## 2021-07-19 ENCOUNTER — Other Ambulatory Visit: Payer: Medicare PPO

## 2021-07-19 ENCOUNTER — Encounter: Payer: Self-pay | Admitting: Cardiology

## 2021-07-19 ENCOUNTER — Other Ambulatory Visit: Payer: Self-pay

## 2021-07-19 VITALS — BP 132/80 | HR 90 | Ht 64.0 in | Wt 199.0 lb

## 2021-07-19 DIAGNOSIS — Z6834 Body mass index (BMI) 34.0-34.9, adult: Secondary | ICD-10-CM

## 2021-07-19 DIAGNOSIS — I4819 Other persistent atrial fibrillation: Secondary | ICD-10-CM | POA: Diagnosis not present

## 2021-07-19 NOTE — Addendum Note (Signed)
Addended by: Leeanne Rio on: 07/19/2021 12:26 PM   Modules accepted: Orders

## 2021-07-19 NOTE — Progress Notes (Signed)
Pt came in for labs this morning & will need to return for recollection. PT jumped on my count of three & her vein shifted. PT didn't seem comfortable with me trying to reroute my needle. She then told me I could not use her right arm & refused for me to draw from her hand. Therefore, I informed pt that she will need to return another day for another attempt in the left arm using the same vein.   I will call patient to get her rescheduled

## 2021-07-19 NOTE — Patient Instructions (Signed)
Medication Instructions:  Your physician recommends that you continue on your current medications as directed. Please refer to the Current Medication list given to you today.  *If you need a refill on your cardiac medications before your next appointment, please call your pharmacy*   Lab Work: None ordered If you have labs (blood work) drawn today and your tests are completely normal, you will receive your results only by: [place] (if you have MyChart) OR A paper copy in the mail If you have any lab test that is abnormal or we need to change your treatment, we will call you to review the results.   Testing/Procedures: None ordered   Follow-Up: At [facility], you and your health needs are our priority.  As part of our continuing mission to provide you with exceptional heart care, we have created designated Provider Care Teams.  These Care Teams include your primary Cardiologist (physician) and Advanced Practice Providers (APPs -  Physician Assistants and Nurse Practitioners) who all work together to provide you with the care you need, when you need it.  We recommend signing up for the patient portal called "MyChart".  Sign up information is provided on this After Visit Summary.  MyChart is used to connect with patients for Virtual Visits (Telemedicine).  Patients are able to view lab/test results, encounter notes, upcoming appointments, etc.  Non-urgent messages can be sent to your provider as well.   To learn more about what you can do with MyChart, go to [email or web address].    Your next appointment:   6 month(s)  The format for your next appointment:   In Person  Provider:   You may see Kate Sable, MD or one of the following Advanced Practice Providers on your designated Care Team:   Murray Hodgkins, NP Christell Faith, PA-C Cadence Kathlen Mody, [place]    Other Instructions

## 2021-07-19 NOTE — Progress Notes (Signed)
Cardiology Office Note:    Date:  07/19/2021   ID:  Deborah Carey, DOB 04-19-1951, MRN [id number]  PCP:  Abner Greenspan, MD   [facility] Providers Cardiologist:  Kate Sable, MD     Referring MD: Abner Greenspan, MD   Chief Complaint  Patient presents with   New Patient (Initial Visit)    Referred by PCP for TIA -- Meds reviewed verbally with patient.     History of Present Illness:    Deborah Carey is a 70 y.o. female with a hx of atrial fibrillation, CVA who presents due to A. fib.  Patient developed right-sided weakness 05/25/2021.  She was emergently taken to the ED at [facility] and code stroke activated.  Was intubated due to worsening mental status.  CTA showed left MCA occlusion, taken emergently to IR for mechanical thrombectomy.  EKG initially showed A. fib RVR with heart rates up to 126.  Echocardiogram 05/2021 showed EF 50 to 55%.  Normal LA size.    Started on Toprol-XL, Eliquis upon discharge.  Denies any bleeding issues with Eliquis, denies palpitations, chest pain or shortness of breath.  Has been successfully going to rehab, although she still has right-sided weakness, this is improving  Past Medical History:  Diagnosis Date   Allergy    allergic rhinitis   Anxiety    Cataract    removed both eyes    Infertility, female    Osteoarthritis of both knees    OA    PONV (postoperative nausea and vomiting)    Post-menopausal bleeding    neg endo bx.    Past Surgical History:  Procedure Laterality Date   cataract surgery  2011   CESAREAN SECTION     times 2   COLONOSCOPY  2010   hems    ENDOMETRIAL BIOPSY     neg for CA cells   FACIAL COSMETIC SURGERY  2006   face lift   FOOT SURGERY  1998   rt heel spur removal   IR CT HEAD LTD  05/25/2021   IR PERCUTANEOUS ART THROMBECTOMY/INFUSION INTRACRANIAL INC DIAG ANGIO  05/25/2021   IR US GUIDE VASC ACCESS RIGHT  05/25/2021   RADIOLOGY WITH ANESTHESIA N/A 05/25/2021   Procedure: IR WITH ANESTHESIA;   Surgeon: Aletta Edouard, MD;  Location: [place];  Service: Radiology;  Laterality: N/A;   REFRACTIVE SURGERY  08/1999    Current Medications: Current Meds  Medication Sig   acetaminophen (TYLENOL) 325 MG tablet Take 1-2 tablets (325-650 mg total) by mouth every 4 (four) hours as needed for mild pain.   apixaban (ELIQUIS) 5 MG TABS tablet Take 1 tablet (5 mg total) by mouth 2 (two) times daily.   atorvastatin (LIPITOR) 40 MG tablet Take 1 tablet (40 mg total) by mouth daily.   metoprolol succinate (TOPROL-XL) 25 MG 24 hr tablet Take 1 tablet (25 mg total) by mouth daily.   pantoprazole (PROTONIX) 40 MG tablet Take 1 tablet (40 mg total) by mouth daily. For reflux--can use as needed if symptoms controlled.   traZODone (DESYREL) 50 MG tablet Take 0.5-1 tablets (25-50 mg total) by mouth at bedtime as needed for sleep.     Allergies:   Patient has no known allergies.   Social History   Socioeconomic History   Marital status: Married    Spouse name: Not on file   Number of children: Not on file   Years of education: Not on file   Highest education level:  Not on file  Occupational History   Occupation: retired  Tobacco Use   Smoking status: Never   Smokeless tobacco: Never  Vaping Use   Vaping Use: Never used  Substance and Sexual Activity   Alcohol use: No    Alcohol/week: 0.0 standard drinks   Drug use: No   Sexual activity: Yes    Partners: Male    Birth control/protection: Post-menopausal  Other Topics Concern   Not on file  Social History Narrative   Not on file   Social Determinants of Health   Financial Resource Strain: Not on file  Food Insecurity: Not on file  Transportation Needs: Not on file  Physical Activity: Not on file  Stress: Not on file  Social Connections: Not on file     Family History: The patient's family history includes Alzheimer's disease in her father; COPD in her brother; Depression in her sister; Hypertension in her father, mother, and  sister; Hypothyroidism in her sister and sister; Osteoporosis in her father, mother, sister, and sister; Parkinson's disease in her sister; Stroke in her mother; Thyroid disease in her sister. There is no history of Breast cancer, Colon polyps, Colon cancer, Esophageal cancer, Rectal cancer, or Stomach cancer.  ROS:   Please see the history of present illness.     All other systems reviewed and are negative.  EKGs/Labs/Other Studies Reviewed:    The following studies were reviewed today:   EKG:  EKG is  ordered today.  The ekg ordered today demonstrates atrial fibrillation, heart rate 90  Recent Labs: 05/25/2021: Magnesium 2.1 05/26/2021: TSH 4.101 06/02/2021: ALT 15 06/07/2021: BUN 17; Creatinine, Ser 0.73; Hemoglobin 12.0; Platelets 238; Potassium 4.1; Sodium 139  Recent Lipid Panel    Component Value Date/Time   CHOL 171 05/26/2021 0429   TRIG 64 05/26/2021 0429   TRIG 66 05/26/2021 0429   HDL 49 05/26/2021 0429   CHOLHDL 3.5 05/26/2021 0429   VLDL 13 05/26/2021 0429   LDLCALC 109 (H) 05/26/2021 0429   LDLDIRECT 141.8 10/30/2013 0951     Risk Assessment/Calculations:         Physical Exam:    VS:  BP 132/80 (BP Location: Left Arm, Patient Position: Sitting, Cuff Size: Normal)   Pulse 90   Ht 5\' 4"  (1.626 m)   Wt 199 lb (90.3 kg)   LMP 09/05/2008   SpO2 99%   BMI 34.16 kg/m     Wt Readings from Last 3 Encounters:  07/19/21 199 lb (90.3 kg)  06/18/21 202 lb 6.4 oz (91.8 kg)  06/17/21 203 lb (92.1 kg)     GEN:  Well nourished, well developed in no acute distress HEENT: Normal NECK: No JVD; No carotid bruits LYMPHATICS: No lymphadenopathy CARDIAC: Irregularly irregular, no murmurs RESPIRATORY:  Clear to auscultation without rales, wheezing or rhonchi  ABDOMEN: Soft, non-tender, non-distended MUSCULOSKELETAL:  No edema; No deformity  SKIN: Warm and dry NEUROLOGIC:  Alert and oriented x 3, right side appears weaker PSYCHIATRIC:  Normal affect   ASSESSMENT:     1. Persistent atrial fibrillation ([place])   2. BMI 34.0-34.9,adult    PLAN:    In order of problems listed above:  Persistent atrial fibrillation, CHA2DS2-VASc of 4 (age, gender, cva).  EKG shows A. fib heart rate 90.  DC cardioversion discussed with patient, she would like to think about this.  Will recommend TEE if DC cardioversion is to be performed due to CVA.  Continue Toprol-XL, Eliquis.  Recommend sleep study eval. Overweight, weight  loss advised, due to CVA, atrial fibrillation, recommend sleep study for possible OSA.  Follow-up in 6 months.     Follow-up in 6 months  Medication Adjustments/Labs and Tests Ordered: Current medicines are reviewed at length with the patient today.  Concerns regarding medicines are outlined above.  Orders Placed This Encounter  Procedures   Ambulatory referral to Pulmonology   EKG 12-Lead    No orders of the defined types were placed in this encounter.   Patient Instructions  Medication Instructions:  Your physician recommends that you continue on your current medications as directed. Please refer to the Current Medication list given to you today.  *If you need a refill on your cardiac medications before your next appointment, please call your pharmacy*   Lab Work: None ordered If you have labs (blood work) drawn today and your tests are completely normal, you will receive your results only by: [place] (if you have MyChart) OR A paper copy in the mail If you have any lab test that is abnormal or we need to change your treatment, we will call you to review the results.   Testing/Procedures: None ordered   Follow-Up: At [facility], you and your health needs are our priority.  As part of our continuing mission to provide you with exceptional heart care, we have created designated Provider Care Teams.  These Care Teams include your primary Cardiologist (physician) and Advanced Practice Providers (APPs -  Physician  Assistants and Nurse Practitioners) who all work together to provide you with the care you need, when you need it.  We recommend signing up for the patient portal called "MyChart".  Sign up information is provided on this After Visit Summary.  MyChart is used to connect with patients for Virtual Visits (Telemedicine).  Patients are able to view lab/test results, encounter notes, upcoming appointments, etc.  Non-urgent messages can be sent to your provider as well.   To learn more about what you can do with MyChart, go to [email or web address].    Your next appointment:   6 month(s)  The format for your next appointment:   In Person  Provider:   You may see Kate Sable, MD or one of the following Advanced Practice Providers on your designated Care Team:   Murray Hodgkins, NP Christell Faith, PA-C Cadence Kathlen Mody, [place]    Other Instructions    Signed, Kate Sable, MD  07/19/2021 4:37 PM    [place]

## 2021-07-20 ENCOUNTER — Telehealth: Payer: Self-pay | Admitting: *Deleted

## 2021-07-20 NOTE — Telephone Encounter (Signed)
I called pt this afternoon to get her scheduled for a restick. Pt declined & does not want to have labs drawn at this time. I offered to have her go to the hospital, [place], or [place]. Pt did not want to schedule anything at this time. I informed pt that I would notify her PCP.

## 2021-07-20 NOTE — Telephone Encounter (Signed)
I think it is ok to wait until we get back to [name].  Please ask her if that is ok.  Can schedule a day we know that Deborah Carey is there because she is a tough draw.

## 2021-07-21 ENCOUNTER — Ambulatory Visit: Payer: Medicare PPO

## 2021-07-21 ENCOUNTER — Other Ambulatory Visit: Payer: Self-pay

## 2021-07-21 DIAGNOSIS — R296 Repeated falls: Secondary | ICD-10-CM | POA: Diagnosis not present

## 2021-07-21 DIAGNOSIS — M6281 Muscle weakness (generalized): Secondary | ICD-10-CM

## 2021-07-21 DIAGNOSIS — R2689 Other abnormalities of gait and mobility: Secondary | ICD-10-CM | POA: Diagnosis not present

## 2021-07-21 DIAGNOSIS — R269 Unspecified abnormalities of gait and mobility: Secondary | ICD-10-CM | POA: Diagnosis not present

## 2021-07-21 DIAGNOSIS — R262 Difficulty in walking, not elsewhere classified: Secondary | ICD-10-CM | POA: Diagnosis not present

## 2021-07-21 DIAGNOSIS — R2681 Unsteadiness on feet: Secondary | ICD-10-CM | POA: Diagnosis not present

## 2021-07-21 NOTE — Therapy (Cosign Needed Addendum)
[place] MAIN [facility] SERVICES [address] [place], [place], [address] Phone: [phone]   Fax:  [phone]  Physical Therapy Treatment  Patient Details  Name: Deborah Carey MRN: [id number] Date of Birth: 1951/01/12 No data recorded  Encounter Date: 07/21/2021   PT End of Session - 07/21/21 1147     Visit Number 9    Number of Visits 13    Date for PT Re-Evaluation 07/28/21    Authorization Type [employer]    Authorization Time Period 06/17/21-07/28/21    Progress Note Due on Visit 10    PT Start Time 1042    PT Stop Time 1126    PT Time Calculation (min) 44 min    Equipment Utilized During Treatment Gait belt    Activity Tolerance Patient tolerated treatment well    Behavior During Therapy WFL for tasks assessed/performed             Past Medical History:  Diagnosis Date   Allergy    allergic rhinitis   Anxiety    Cataract    removed both eyes    Infertility, female    Osteoarthritis of both knees    OA    PONV (postoperative nausea and vomiting)    Post-menopausal bleeding    neg endo bx.    Past Surgical History:  Procedure Laterality Date   cataract surgery  2011   CESAREAN SECTION     times 2   COLONOSCOPY  2010   hems    ENDOMETRIAL BIOPSY     neg for CA cells   FACIAL COSMETIC SURGERY  2006   face lift   FOOT SURGERY  1998   rt heel spur removal   IR CT HEAD LTD  05/25/2021   IR PERCUTANEOUS ART THROMBECTOMY/INFUSION INTRACRANIAL INC DIAG ANGIO  05/25/2021   IR US GUIDE VASC ACCESS RIGHT  05/25/2021   RADIOLOGY WITH ANESTHESIA N/A 05/25/2021   Procedure: IR WITH ANESTHESIA;  Surgeon: Aletta Edouard, MD;  Location: [facility];  Service: Radiology;  Laterality: N/A;   REFRACTIVE SURGERY  08/1999    There were no vitals filed for this visit.   Subjective Assessment - 07/21/21 1041     Subjective Pt reports no major changes since her last visit. She denies any falls and any pain.    Patient is  accompained by: [name]   Pertinent History 70 yo Female s/p CVA on 05/25/21; She was admitted to hospital for approximately 1 week and then discharged to inpatient rehab for 1 week. She was discharged home in October 2022. She reports since being home she has been doing well. She presents to therapy without assistive device. She denies using any assistive device at home. Denies any recent falls. She denies any stumbles or unsteadiness. She reports intermittent fatigue requiring rest at times. Pt reports she is not cooking at this time. She also reports she is not cleaning much. She reports trying some activities but fatigues quickly. She reports she does sleep well at night.She denies any numbness/tingling;    Limitations Standing;Walking    How long can you sit comfortably? NA    How long can you stand comfortably? 10-15 min;    How long can you walk comfortably? approximately 10 min, >500 feet; Does fatigue with long distance walking    Diagnostic tests MRI on 05/26/21: Acute infarction of the left putamen and caudate. Minimal petechial  blood products but no frank hematoma. Mild swelling  but no midline  shift.     Three punctate acute infarctions within the medial right frontal  lobe, probably micro embolic infarctions in the anterior cerebral  artery territory.     Old infarction in the right parietal cortical and subcortical brain  and seen affecting the cerebral hemispheric deep white matter  extensively.    Patient Stated Goals improve energy; be able to walk better- faster and longer;    Pain Onset More than a month ago              Therex:   STS x 10 with UUE support.  STS 2 x 5 without UE support. Pt reports more difficulty with arms crossed. Pt perceived exercise as hard and has some mild right knee discomfort that subsides immediately after.   Standing hip abduction 2 x 15 B with 3 lb ankle weights. VC to stand up straight and minimize trunk lean. Pt rates exercise as hard and has  more trouble on the right side.   Standing hip extension x 15 B with 3 lb ankle weights.   Step ups on 6 in step without UE support 2 x 8, first set with 3 lb ankle weights, second set without added weight. VC on increasing hip flexion and ankle dorsiflexion to improve foot clearance. Pt reported some minor dizziness/ light headedness following second set. BP recorded as 114/62.      Neuromuscular Re-ed: CGA provided throughout unless otherwise indicated   On airex:   NBOS EC 2 x 30 sec, mild sway to the left side but no indications of LOB   Semi-tandem (staggered) stance 2 x 30 sec each side. Pt perceived as easy   Semi-tandem (staggered) stance with horizontal head turns 2 x 30 sec each side. Pt perceived as medium difficulty. Pt presents with mild lateral sway favoring the left side.   On half foam, flat side up WBOS ankle rocker x 15. Pt requires frequent UUE support to regain balance.  Forward ambulation 2 x 10 meters with horizontal head turns. Pt shows mild rightward drift in ambulation with no LOB  Forward ambulation 2 x 10 meters with vertical head turns. Pt shows mild rightward drift in ambulation with no LOB   Pt instructed to keep track of any dizziness or lightheadedness over the next few days and let us no if it persists      PT Education - 07/21/21 1146     Education Details exercise technique, body mechanics    Person(s) Educated Patient    Methods Explanation;Demonstration;Verbal cues    Comprehension Verbalized understanding;Returned demonstration;Verbal cues required;Need further instruction              PT Short Term Goals - 06/16/21 1053       PT SHORT TERM GOAL #1   Title Patient will be adherent to HEP at least 3x a week to improve functional strength and balance for better safety at home.    Time 4    Period Weeks    Status New    Target Date 07/14/21               PT Long Term Goals - 06/16/21 1054       PT LONG TERM GOAL #1    Title Patient (> 81 years old) will complete five times sit to stand test in < 15 seconds indicating an increased LE strength and improved balance.    Time 6    Period Weeks  Status New    Target Date 07/28/21      PT LONG TERM GOAL #2   Title Patient will increase RLE gross strength to 4+/5 as to improve functional strength for independent gait, increased standing tolerance and increased ADL ability.    Time 6    Period Weeks    Status New    Target Date 07/28/21      PT LONG TERM GOAL #3   Title Patient will increase six minute walk test distance to >1300 for progression to community ambulator and improve gait ability closer to age group norms of 1644 feet;    Time 6    Period Weeks    Status New    Target Date 07/28/21      PT LONG TERM GOAL #4   Title Patient will ascend/descend 4 stairs without rail assist independently without loss of balance to improve ability to get in/out of home while carrying something.    Time 6    Period Weeks    Status New    Target Date 07/28/21      PT LONG TERM GOAL #5   Title Patient will improve FOTO score by 5% to indicate improved functional mobility with ADLs.    Time 6    Period Weeks    Status New    Target Date 07/28/21      Additional Long Term Goals   Additional Long Term Goals Yes      PT LONG TERM GOAL #6   Title Patient will increase Functional Gait Assessment score to >22/30 as to reduce fall risk and improve dynamic gait safety with community ambulation.    Time 6    Period Weeks    Status New    Target Date 07/28/21                   Plan - 07/21/21 1148     Clinical Impression Statement Patient exhibits good motivation throughout session today. She continues to show improvements with her balance, but is still challenged by balanced exercises with head movement or eyes closed. She was able to progress her strengthening exercises today to 3# ankle weights and is challenged primarily on her right side still. Pt  exhibited some minor dizziness that resolved with a rest break. Blood pressure was taken to assess any hypotension yielding 114/62 mm Hg. Pt instructed to monitor any dizziness in the upcoming days and let us know if it persists. Pt will benefit from further skilled PT to improve mobility, strength, and balance to increase ease and safety of ADLs.    Personal Factors and Comorbidities Age;Comorbidity 3+    Comorbidities history of chronic right foot pain, knee OA, hypercholesterolemia, abnormal heart rhythm (on blood thinners), osteopenia,    Examination-Activity Limitations Caring for Others;Squat;Stairs;Stand;Transfers    Examination-Participation Restrictions Cleaning;Community Activity;Shop;Volunteer    Stability/Clinical Decision Making Stable/Uncomplicated    Rehab Potential Good    PT Frequency 2x / week    PT Duration 6 weeks    PT Treatment/Interventions Cryotherapy;Moist Heat;Electrical Stimulation;Gait training;Stair training;Functional mobility training;Therapeutic activities;Therapeutic exercise;Balance training;Neuromuscular re-education;Patient/family education;Energy conservation    PT Next Visit Plan review HEP- high level balance training: assess Right foot for pain as appropriate. Floor to chair transfers.    PT Home Exercise Plan 06/28/2021: Access Code: [email or web address]    Consulted and Agree with Plan of Care Patient             Patient will benefit from skilled therapeutic intervention in  order to improve the following deficits and impairments:  Abnormal gait, Decreased balance, Decreased endurance, Decreased mobility, Difficulty walking, Cardiopulmonary status limiting activity, Decreased strength, Decreased safety awareness, Decreased activity tolerance  Visit Diagnosis: Unsteadiness on feet  Muscle weakness (generalized)  Other abnormalities of gait and mobility     Problem List Patient Active Problem List   Diagnosis Date Noted   Chronic ischemic left MCA stroke  06/18/2021   Cholelithiasis without cholecystitis 06/07/2021   Nodule of apex of right lung 06/07/2021   History of CVA (cerebrovascular accident) 05/25/2021   Atrial fibrillation ([place])    Mass of breast, left 11/13/2020   Colon cancer screening 05/14/2019   Vitamin D deficiency 05/08/2018   [place] 06/20/2017   H/O fracture of fibula 01/21/2014   Osteopenia 01/07/2014   Family history of osteoporosis 11/29/2013   Estrogen deficiency 11/29/2013   Hyperlipidemia 11/29/2013   Encounter for Medicare annual wellness exam 10/29/2013   Irregular heartbeat 10/07/2013   Left thyroid nodule 09/26/2013   Enlarged thyroid 09/16/2013   Primary osteoarthritis of both knees 11/05/2012   Other screening mammogram 09/27/2011   Routine general medical examination at a health care facility 09/19/2011   Class 1 obesity due to excess calories with serious comorbidity and body mass index (BMI) of 34.0 to 34.9 in adult 06/06/2008   Prediabetes 06/06/2008   Anxiety state 04/17/2007   ALLERGIC RHINITIS 04/17/2007   Ricard Dillon PT, DPT This entire session was performed under direct supervision and direction of a licensed therapist/therapist assistant . I have personally read, edited and approve of the note as written.   Zollie Pee, PT 07/21/2021, 5:15 PM  [place] MAIN [facility] SERVICES [address] [place], [place], [address] Phone: [phone]   Fax:  [phone]  Name: Deborah Carey MRN: [id number] Date of Birth: May 10, 1951

## 2021-07-23 ENCOUNTER — Encounter: Payer: Medicare PPO | Admitting: Speech Pathology

## 2021-07-23 ENCOUNTER — Ambulatory Visit: Payer: Medicare PPO

## 2021-07-23 ENCOUNTER — Other Ambulatory Visit: Payer: Self-pay

## 2021-07-23 ENCOUNTER — Ambulatory Visit: Payer: Medicare PPO | Admitting: Pulmonary Disease

## 2021-07-23 ENCOUNTER — Encounter: Payer: Self-pay | Admitting: Pulmonary Disease

## 2021-07-23 VITALS — BP 130/66 | HR 65 | Temp 97.1°F | Ht 64.0 in | Wt 199.6 lb

## 2021-07-23 DIAGNOSIS — I4819 Other persistent atrial fibrillation: Secondary | ICD-10-CM

## 2021-07-23 DIAGNOSIS — R911 Solitary pulmonary nodule: Secondary | ICD-10-CM

## 2021-07-23 DIAGNOSIS — Z8673 Personal history of transient ischemic attack (TIA), and cerebral infarction without residual deficits: Secondary | ICD-10-CM

## 2021-07-23 NOTE — Telephone Encounter (Signed)
CALLED PATIENT AND LEFT VM TO CALL BACK

## 2021-07-23 NOTE — Telephone Encounter (Signed)
Please call and schedule pt for a lab appt next month when we are back at [facility]. if pt will allow it

## 2021-07-23 NOTE — Patient Instructions (Addendum)
We are going to get a better picture of the chest with a CT.  You will not need to get any dye for the CT.  This will let us know more about your lung nodule.   She will follow-up in 4 to 6 weeks time.  We will be calling you with the results of your CT prior to that time and also with any determination if we need to do anything further with this nodule.

## 2021-07-23 NOTE — Progress Notes (Signed)
Subjective:    Patient ID: Deborah Carey, female    DOB: July 26, 1951, 70 y.o.   MRN: [id number] Chief Complaint  Patient presents with   Consult    Lung nodule of right lung     HPI Patient is a 70 year old lifelong never smoker who presents for evaluation of a lung nodule noted during a head and neck CT performed during evaluation of the patient during an active stroke.  Patient is kindly referred by Dr. Vinnie Langton.  The CT study in question was performed at [facility].  The patient was noted to have a 1.1 cm x 1.0 cm soft tissue nodule in the medial right apex.  She has not had a dedicated CT chest.  She was admitted to [facility] in September 2022 due to a stroke.  The patient has been asymptomatic with regards to the chest findings.  She has not had any weight loss nor anorexia.  No cough or sputum production.  No hemoptysis.  She has A-fib and is on Eliquis.  She is still undergoing rehab for her stroke.  She does not endorse any other symptomatology.  Past medical history, surgical history, family history and social history has been reviewed as below.  Available imaging has been reviewed independently.  Relevant images were shown to the patient and her husband who accompanies her today.  Review of Systems A 10 point review of systems was performed and it is as noted above otherwise negative.  Past Medical History:  Diagnosis Date   Allergy    allergic rhinitis   Anxiety    Cataract    removed both eyes    Infertility, female    Osteoarthritis of both knees    OA    PONV (postoperative nausea and vomiting)    Post-menopausal bleeding    neg endo bx.   Past Surgical History:  Procedure Laterality Date   cataract surgery  2011   CESAREAN SECTION     times 2   COLONOSCOPY  2010   hems    ENDOMETRIAL BIOPSY     neg for CA cells   FACIAL COSMETIC SURGERY  2006   face lift   FOOT SURGERY  1998   rt heel spur removal   IR CT HEAD LTD  05/25/2021   IR PERCUTANEOUS ART  THROMBECTOMY/INFUSION INTRACRANIAL INC DIAG ANGIO  05/25/2021   IR US GUIDE VASC ACCESS RIGHT  05/25/2021   RADIOLOGY WITH ANESTHESIA N/A 05/25/2021   Procedure: IR WITH ANESTHESIA;  Surgeon: Irish Lack, MD;  Location: [facility] OR;  Service: Radiology;  Laterality: N/A;   REFRACTIVE SURGERY  08/1999   Family History  Problem Relation Age of Onset   COPD Brother    Stroke Mother    Hypertension Mother    Osteoporosis Mother    Alzheimer's disease Father    Hypertension Father    Osteoporosis Father    Depression Sister    Hypertension Sister    Osteoporosis Sister    Thyroid disease Sister    Hypothyroidism Sister    Parkinson's disease Sister    Osteoporosis Sister    Hypothyroidism Sister    Breast cancer Neg Hx    Colon polyps Neg Hx    Colon cancer Neg Hx    Esophageal cancer Neg Hx    Rectal cancer Neg Hx    Stomach cancer Neg Hx    Social History   Tobacco Use   Smoking status: Never   Smokeless tobacco: Never  Substance Use Topics   Alcohol use: No    Alcohol/week: 0.0 standard drinks   No Known Allergies  Current Meds  Medication Sig   acetaminophen (TYLENOL) 325 MG tablet Take 1-2 tablets (325-650 mg total) by mouth every 4 (four) hours as needed for mild pain.   apixaban (ELIQUIS) 5 MG TABS tablet Take 1 tablet (5 mg total) by mouth 2 (two) times daily.   atorvastatin (LIPITOR) 40 MG tablet Take 1 tablet (40 mg total) by mouth daily.   metoprolol succinate (TOPROL-XL) 25 MG 24 hr tablet Take 1 tablet (25 mg total) by mouth daily.   pantoprazole (PROTONIX) 40 MG tablet Take 1 tablet (40 mg total) by mouth daily. For reflux--can use as needed if symptoms controlled.   traZODone (DESYREL) 50 MG tablet Take 0.5-1 tablets (25-50 mg total) by mouth at bedtime as needed for sleep.   Immunization History  Administered Date(s) Administered   Fluad Quad(high Dose 65+) 05/14/2019, 05/29/2020   Influenza Split 09/27/2011   Influenza, High Dose Seasonal PF 07/18/2017    Influenza,inj,Quad PF,6+ Mos 09/09/2015, 04/29/2016, 05/08/2018   PFIZER Comirnaty(Gray Top)Covid-19 Tri-Sucrose Vaccine 10/10/2019, 10/31/2019   PFIZER(Purple Top)SARS-COV-2 Vaccination 07/27/2020   Pneumococcal Conjugate-13 04/28/2015   Pneumococcal Polysaccharide-23 11/29/2013   Td 01/21/2003   Tdap 11/05/2012       Objective:   Physical Exam BP 130/66 (BP Location: Left Arm, Patient Position: Sitting, Cuff Size: Normal)   Pulse 65   Temp (!) 97.1 F (36.2 C) (Oral)   Ht 5\' 4"  (1.626 m)   Wt 199 lb 9.6 oz (90.5 kg)   LMP 09/05/2008   SpO2 98%   BMI 34.26 kg/m   SpO2: 98 % O2 Device: None (Room air)  GENERAL: Overweight woman, no acute distress, HEAD: Normocephalic, atraumatic.  EYES: Pupils equal, round, reactive to light.  No scleral icterus.  MOUTH: Nose/mouth/throat not examined due to institutional masking requirements for COVID-19. NECK: Supple. No thyromegaly. Trachea midline. No JVD.  No adenopathy. PULMONARY: Good air entry bilaterally.  No adventitious sounds. CARDIOVASCULAR: S1 and S2.  Regular rate and rhythm with controlled ventricular response.  ABDOMEN: Obese otherwise benign. MUSCULOSKELETAL: No joint deformity, no clubbing, no edema.  NEUROLOGIC: Right-sided weakness poststroke.  No other focal deficits. SKIN: Intact,warm,dry. PSYCH: Flat affect, normal behavior.      Assessment & Plan:     ICD-10-CM   1. Nodule of apex of right lung  R91.1 CT SUPER D CHEST WO [facility] PILOT   [facility] protocol chest CT May need PET/CT pending chest CT results Lesion may be benign     2. Persistent atrial fibrillation (HCC)  I48.19    On Eliquis Issue adds complexity to her management    3. History of CVA (cerebrovascular accident)  Z11.73      Orders Placed This Encounter  Procedures   CT SUPER D CHEST WO [facility] PILOT    [facility] Protocol  Slice Thickness 0.75 Slice Internal 0.5 Arms Down Inspiratory Image    Standing Status:   Future    Standing  Expiration Date:   07/23/2022    Order Specific Question:   Preferred imaging location?    Answer:   [facility]   Patient will need a dedicated chest CT to place the pulmonary nodule in context.  Currently she is not too enthusiastic about undergoing biopsies and would like further workup.  Will proceed with the same.  We will see her in follow-up in 4 to 6 weeks time she is to  contact us prior to that time should any new difficulties arise.  Gailen Shelter, MD Advanced Bronchoscopy [facility]    *This note was dictated using voice recognition software/Dragon.  Despite best efforts to proofread, errors can occur which can change the meaning. Any transcriptional errors that result from this process are unintentional and may not be fully corrected at the time of dictation.

## 2021-07-26 ENCOUNTER — Ambulatory Visit: Payer: Medicare PPO

## 2021-07-26 ENCOUNTER — Encounter: Payer: Medicare PPO | Admitting: Speech Pathology

## 2021-07-26 ENCOUNTER — Other Ambulatory Visit: Payer: Self-pay

## 2021-07-26 DIAGNOSIS — R2681 Unsteadiness on feet: Secondary | ICD-10-CM

## 2021-07-26 DIAGNOSIS — R262 Difficulty in walking, not elsewhere classified: Secondary | ICD-10-CM | POA: Diagnosis not present

## 2021-07-26 DIAGNOSIS — M6281 Muscle weakness (generalized): Secondary | ICD-10-CM | POA: Diagnosis not present

## 2021-07-26 DIAGNOSIS — R269 Unspecified abnormalities of gait and mobility: Secondary | ICD-10-CM

## 2021-07-26 DIAGNOSIS — R296 Repeated falls: Secondary | ICD-10-CM | POA: Diagnosis not present

## 2021-07-26 DIAGNOSIS — R2689 Other abnormalities of gait and mobility: Secondary | ICD-10-CM | POA: Diagnosis not present

## 2021-07-26 NOTE — Telephone Encounter (Signed)
Called patient to schedule a lab . [id number]

## 2021-07-26 NOTE — Therapy (Addendum)
[place] MAIN [facility] SERVICES [address] [place], [place], [address] Phone: [phone]   Fax:  [phone]  Physical Therapy Treatment/RECERTIFICATION for dates 07/26/2021 - 10/18/2021 and  Physical Therapy Progress Note   Dates of reporting period  06/17/2021   to   07/26/2021  Patient Details  Name: Deborah Carey MRN: [id number] Date of Birth: October 28, 1950 No data recorded  Encounter Date: 07/26/2021   PT End of Session - 07/26/21 1710     Visit Number 10    Number of Visits 35    Date for PT Re-Evaluation 10/18/21    Authorization Type [employer]    Authorization Time Period 06/17/21-07/28/21: 07/26/21-10/18/21    Progress Note Due on Visit 20    PT Start Time 1605    PT Stop Time 1648    PT Time Calculation (min) 43 min    Equipment Utilized During Treatment Gait belt    Activity Tolerance Patient tolerated treatment well    Behavior During Therapy WFL for tasks assessed/performed             Past Medical History:  Diagnosis Date   Allergy    allergic rhinitis   Anxiety    Cataract    removed both eyes    Infertility, female    Osteoarthritis of both knees    OA    PONV (postoperative nausea and vomiting)    Post-menopausal bleeding    neg endo bx.    Past Surgical History:  Procedure Laterality Date   cataract surgery  2011   CESAREAN SECTION     times 2   COLONOSCOPY  2010   hems    ENDOMETRIAL BIOPSY     neg for CA cells   FACIAL COSMETIC SURGERY  2006   face lift   FOOT SURGERY  1998   rt heel spur removal   IR CT HEAD LTD  05/25/2021   IR PERCUTANEOUS ART THROMBECTOMY/INFUSION INTRACRANIAL INC DIAG ANGIO  05/25/2021   IR US GUIDE VASC ACCESS RIGHT  05/25/2021   RADIOLOGY WITH ANESTHESIA N/A 05/25/2021   Procedure: IR WITH ANESTHESIA;  Surgeon: Aletta Edouard, MD;  Location: [facility];  Service: Radiology;  Laterality: N/A;   REFRACTIVE SURGERY  08/1999    There were no vitals filed for this  visit.   Subjective Assessment - 07/26/21 1708     Subjective Pt reports she has been completing her HEP 3x a week. She denies any falls and any pain today.    Patient is accompained by: [name]   Pertinent History 70 yo Female s/p CVA on 05/25/21; She was admitted to hospital for approximately 1 week and then discharged to inpatient rehab for 1 week. She was discharged home in October 2022. She reports since being home she has been doing well. She presents to therapy without assistive device. She denies using any assistive device at home. Denies any recent falls. She denies any stumbles or unsteadiness. She reports intermittent fatigue requiring rest at times. Pt reports she is not cooking at this time. She also reports she is not cleaning much. She reports trying some activities but fatigues quickly. She reports she does sleep well at night.She denies any numbness/tingling;    Limitations Standing;Walking    How long can you sit comfortably? NA    How long can you stand comfortably? 10-15 min;    How long can you walk comfortably? approximately 10 min, >500 feet; Does fatigue with long  distance walking    Diagnostic tests MRI on 05/26/21: Acute infarction of the left putamen and caudate. Minimal petechial  blood products but no frank hematoma. Mild swelling but no midline  shift.     Three punctate acute infarctions within the medial right frontal  lobe, probably micro embolic infarctions in the anterior cerebral  artery territory.     Old infarction in the right parietal cortical and subcortical brain  and seen affecting the cerebral hemispheric deep white matter  extensively.    Patient Stated Goals improve energy; be able to walk better- faster and longer;    Currently in Pain? No/denies    Pain Onset More than a month ago           Goals reassessment:  6MWT: Pt ambulated 1080 ft with CGA. HR 1 minute after: 74 bpm, SPO2: 100%  LE Strength: Gross strength 4+/5 apart from R dorsiflexion  (4/5)  FOTO: 61% (goal of 84)  5xSTS: 18.83 seconds with arms crossed across chest (improvement from 21.62 sec, 10/12)  FGA: 20/30 (improvement from 18/30, 06/16/21)  HEP: pt states she is completing 3x a week  Neuromuscular Re-ed: All interventions with CGA unless otherwise stated  On flat ground at support surface: Tandem stance 2 x 45 sec each leg. Pt displays mild lateral sway and requires UE support to regain balance x 2 times. Pt balance improves with trials.  On airex:  NBOS, eyes closed 2 x 45 sec. Pt displays minimal sway WBOS, reaching for 4 colorful cones, 2 in front, 1 on either side x multiple repetitions.  - Progressed to color combinations reaching for all 4 cones in a sequence - Progressed further to NBOS Pt displays mild sway with no signs of LOB.  Assessment:  Pt presented with good motivation for goal reassessment today. Pt FOTO score dipped a bit showing potential decrease in self perceived ability. However, pt improved on many performance based measures such as the 5xSTS and FGA showing improvements in dynamic balance and lower extremity power. Pt 6MWT distance decreased slightly (20 ft) from baseline, but pt reported far less fatigue. Patient's condition has the potential to improve in response to therapy. Maximum improvement is yet to be obtained. The anticipated improvement is attainable and reasonable in a generally predictable time. The pt will benefit from continued skilled PT to improve LE strength, balance, and muscular endurance to improve quality of life and ease of ADLs.    Patient's condition has the potential to improve in response to therapy. Maximum improvement is yet to be obtained. The anticipated improvement is attainable and reasonable in a generally predictable time.        PT Education - 07/26/21 1710     Education Details Goal reassessment findings    Person(s) Educated Patient    Methods Explanation    Comprehension Verbalized  understanding              PT Short Term Goals - 07/26/21 1714       PT SHORT TERM GOAL #1   Title Patient will be adherent to HEP at least 3x a week to improve functional strength and balance for better safety at home.    Baseline 11/21: Pt reports completing her HEP 3x a week    Time 4    Period Weeks    Status Achieved    Target Date 07/14/21               PT Long Term Goals - 07/26/21  [place] #1   Title Patient (> 11 years old) will complete five times sit to stand test in < 15 seconds indicating an increased LE strength and improved balance.    Baseline 11/21: 18.83 sec with arms crossed across chest    Time 12    Period Weeks    Status On-going    Target Date 10/18/21      PT LONG TERM GOAL #2   Title Patient will increase RLE gross strength to 4+/5 as to improve functional strength for independent gait, increased standing tolerance and increased ADL ability.    Baseline 11/21: 4/5 R dorsiflexion, everything else at least 4+/5    Time 12    Period Weeks    Status On-going    Target Date 10/18/21      PT LONG TERM GOAL #3   Title Patient will increase six minute walk test distance to >1300 for progression to community ambulator and improve gait ability closer to age group norms of 1644 feet;    Baseline 11/21: 1080 ft with CGA and no AD    Time 12    Period Weeks    Status On-going    Target Date 10/18/21      PT LONG TERM GOAL #4   Title Patient will ascend/descend 4 stairs without rail assist independently without loss of balance to improve ability to get in/out of home while carrying something.    Baseline 11/21: Pt requires 1 hand for descending stairs.    Time 12    Period Weeks    Status On-going    Target Date 10/18/21      PT LONG TERM GOAL #5   Title Patient will improve FOTO score by 5% to indicate improved functional mobility with ADLs.    Baseline 11/21: 61%    Time 12    Period Weeks    Status On-going    Target  Date 10/18/21      PT LONG TERM GOAL #6   Title Patient will increase Functional Gait Assessment score to >22/30 as to reduce fall risk and improve dynamic gait safety with community ambulation.    Baseline 11/21: 20/30    Time 12    Period Weeks    Status On-going    Target Date 10/18/21                   Plan - 07/26/21 1723     Clinical Impression Statement Pt presented with good motivation for goal reassessment today. Pt FOTO score dipped a bit showing potential decrease in self perceived ability. However, pt improved on many performance based measures such as the 5xSTS and FGA showing improvements in dynamic balance and lower extremity power. Pt 6MWT distance decreased slightly (20 ft) from baseline, but pt reported far less fatigue. Patient's condition has the potential to improve in response to therapy. Maximum improvement is yet to be obtained. The anticipated improvement is attainable and reasonable in a generally predictable time. The pt will benefit from continued skilled PT to improve LE strength, balance, and muscular endurance to improve quality of life and ease of ADLs.    Personal Factors and Comorbidities Age;Comorbidity 3+    Comorbidities history of chronic right foot pain, knee OA, hypercholesterolemia, abnormal heart rhythm (on blood thinners), osteopenia,    Examination-Activity Limitations Caring for Others;Squat;Stairs;Stand;Transfers    Examination-Participation Restrictions Cleaning;Community Activity;Shop;Volunteer    Stability/Clinical Decision Making Stable/Uncomplicated  Rehab Potential Good    PT Frequency 2x / week    PT Duration 6 weeks    PT Treatment/Interventions Cryotherapy;Moist Heat;Electrical Stimulation;Gait training;Stair training;Functional mobility training;Therapeutic activities;Therapeutic exercise;Balance training;Neuromuscular re-education;Patient/family education;Energy conservation    PT Next Visit Plan review HEP- high level  balance training: assess Right foot for pain as appropriate. Floor to chair transfers.    PT Home Exercise Plan 06/28/2021: Access Code: [email or web address]    Consulted and Agree with Plan of Care Patient             Patient will benefit from skilled therapeutic intervention in order to improve the following deficits and impairments:  Abnormal gait, Decreased balance, Decreased endurance, Decreased mobility, Difficulty walking, Cardiopulmonary status limiting activity, Decreased strength, Decreased safety awareness, Decreased activity tolerance  Visit Diagnosis: No diagnosis found.     Problem List Patient Active Problem List   Diagnosis Date Noted   Chronic ischemic left MCA stroke 06/18/2021   Cholelithiasis without cholecystitis 06/07/2021   Nodule of apex of right lung 06/07/2021   History of CVA (cerebrovascular accident) 05/25/2021   Atrial fibrillation ([place])    Mass of breast, left 11/13/2020   Colon cancer screening 05/14/2019   Vitamin D deficiency 05/08/2018   Flushing 06/20/2017   H/O fracture of fibula 01/21/2014   Osteopenia 01/07/2014   Family history of osteoporosis 11/29/2013   Estrogen deficiency 11/29/2013   Hyperlipidemia 11/29/2013   Encounter for Medicare annual wellness exam 10/29/2013   Irregular heartbeat 10/07/2013   Left thyroid nodule 09/26/2013   Enlarged thyroid 09/16/2013   Primary osteoarthritis of both knees 11/05/2012   Other screening mammogram 09/27/2011   Routine general medical examination at a health care facility 09/19/2011   Class 1 obesity due to excess calories with serious comorbidity and body mass index (BMI) of 34.0 to 34.9 in adult 06/06/2008   Prediabetes 06/06/2008   Anxiety state 04/17/2007   ALLERGIC RHINITIS 04/17/2007    Karn Cassis, SPT This entire session was performed under direct supervision and direction of a licensed therapist/therapist assistant . I have personally read, edited and approve of the note as written.    Ollen Bowl, PT 07/26/2021, 5:39 PM  [place] MAIN [facility] SERVICES [address] [place], [place], [address] Phone: [phone]   Fax:  [phone]  Name: CHANDRIA ROOKSTOOL MRN: [id number] Date of Birth: Oct 06, 1950

## 2021-07-27 ENCOUNTER — Inpatient Hospital Stay: Payer: Medicare Other | Admitting: Adult Health

## 2021-08-03 ENCOUNTER — Encounter: Payer: Medicare PPO | Admitting: Speech Pathology

## 2021-08-03 ENCOUNTER — Other Ambulatory Visit: Payer: Self-pay

## 2021-08-03 ENCOUNTER — Ambulatory Visit: Payer: Medicare PPO

## 2021-08-03 DIAGNOSIS — R296 Repeated falls: Secondary | ICD-10-CM | POA: Diagnosis not present

## 2021-08-03 DIAGNOSIS — R269 Unspecified abnormalities of gait and mobility: Secondary | ICD-10-CM

## 2021-08-03 DIAGNOSIS — M6281 Muscle weakness (generalized): Secondary | ICD-10-CM

## 2021-08-03 DIAGNOSIS — R262 Difficulty in walking, not elsewhere classified: Secondary | ICD-10-CM | POA: Diagnosis not present

## 2021-08-03 DIAGNOSIS — R2689 Other abnormalities of gait and mobility: Secondary | ICD-10-CM | POA: Diagnosis not present

## 2021-08-03 DIAGNOSIS — R2681 Unsteadiness on feet: Secondary | ICD-10-CM | POA: Diagnosis not present

## 2021-08-03 NOTE — Therapy (Addendum)
[place] MAIN [facility] SERVICES [address] [place], [place], [address] Phone: [phone]   Fax:  [phone]  Physical Therapy Treatment  Patient Details  Name: Deborah Carey MRN: [id number] Date of Birth: April 27, 1951 No data recorded  Encounter Date: 08/03/2021   PT End of Session - 08/03/21 1007     Visit Number 11    Number of Visits 35    Date for PT Re-Evaluation 10/18/21    Authorization Type [employer]    Authorization Time Period 06/17/21-07/28/21: 07/26/21-10/18/21    Progress Note Due on Visit 20    PT Start Time 0916    PT Stop Time 0959    PT Time Calculation (min) 43 min    Equipment Utilized During Treatment Gait belt    Activity Tolerance Patient tolerated treatment well;Patient limited by pain    Behavior During Therapy WFL for tasks assessed/performed             Past Medical History:  Diagnosis Date   Allergy    allergic rhinitis   Anxiety    Cataract    removed both eyes    Infertility, female    Osteoarthritis of both knees    OA    PONV (postoperative nausea and vomiting)    Post-menopausal bleeding    neg endo bx.    Past Surgical History:  Procedure Laterality Date   cataract surgery  2011   CESAREAN SECTION     times 2   COLONOSCOPY  2010   hems    ENDOMETRIAL BIOPSY     neg for CA cells   FACIAL COSMETIC SURGERY  2006   face lift   FOOT SURGERY  1998   rt heel spur removal   IR CT HEAD LTD  05/25/2021   IR PERCUTANEOUS ART THROMBECTOMY/INFUSION INTRACRANIAL INC DIAG ANGIO  05/25/2021   IR US GUIDE VASC ACCESS RIGHT  05/25/2021   RADIOLOGY WITH ANESTHESIA N/A 05/25/2021   Procedure: IR WITH ANESTHESIA;  Surgeon: Aletta Edouard, MD;  Location: [place];  Service: Radiology;  Laterality: N/A;   REFRACTIVE SURGERY  08/1999    There were no vitals filed for this visit.   Subjective Assessment - 08/03/21 0915     Subjective Pt denies any pain today but states she has had some pain in the  right medial thigh over the last week when moving into abduction. She denies any new falls or stumbles and reports doing some exercises since her last session.    Patient is accompained by: [name]   Pertinent History 70 yo Female s/p CVA on 05/25/21; She was admitted to hospital for approximately 1 week and then discharged to inpatient rehab for 1 week. She was discharged home in October 2022. She reports since being home she has been doing well. She presents to therapy without assistive device. She denies using any assistive device at home. Denies any recent falls. She denies any stumbles or unsteadiness. She reports intermittent fatigue requiring rest at times. Pt reports she is not cooking at this time. She also reports she is not cleaning much. She reports trying some activities but fatigues quickly. She reports she does sleep well at night.She denies any numbness/tingling;    Limitations Standing;Walking    How long can you sit comfortably? NA    How long can you stand comfortably? 10-15 min;    How long can you walk comfortably? approximately 10 min, >500 feet; Does fatigue with long  distance walking    Diagnostic tests MRI on 05/26/21: Acute infarction of the left putamen and caudate. Minimal petechial  blood products but no frank hematoma. Mild swelling but no midline  shift.     Three punctate acute infarctions within the medial right frontal  lobe, probably micro embolic infarctions in the anterior cerebral  artery territory.     Old infarction in the right parietal cortical and subcortical brain  and seen affecting the cerebral hemispheric deep white matter  extensively.    Patient Stated Goals improve energy; be able to walk better- faster and longer;    Currently in Pain? No/denies    Pain Onset More than a month ago              INTERVENTION  Therex:   Treadmill walking for 6 minutes to improve cardiorespiratory endurance. Pt ambulates up to 1.6 mph for a total distance of  0.54m. Pt rated exercise 6/10 on RPE scale.  STS 2 x 10 with UUE support. Pt reports as medium. VC to stand straight up to avoid leaning toward the L side. She reports mild R knee discomfort that subsides with correct form. Pt requires rest breaks between sets   Standing hip abduction 2 x 10 B with 4 lb ankle weights. VC to stand up straight and minimize trunk lean. Pt rates exercise as medium on the R and easy on the L.    Standing hip extension 2 x 10 B with 4 lb ankle weights. VC to keep the foot off the ground during the concentric phase. Pt rates as medium.  LAQ x 12 with 4 lb ankle weight. Pt displays good control in both LEs   Toe taps on 6 in step with BUE support  x 10 reps each side with 4 lb ankle weights  - progressed to UUE support 2 x 10 reps each side with 4 lb ankle weights. Pt displayed more difficulty on the R side.  Ambulation x 148 ft with 4 lb ankle weights to improve muscular endurance and focus on foot clearance when walking. VC on heel to toe sequencing for improved heel strike and increased hip flexion and ankle dorsiflexion to improve foot clearance.         PT Education - 08/03/21 1006     Education Details exercise technique, body mechanics, gait mechanics    Person(s) Educated Patient    Methods Demonstration;Explanation;Tactile cues;Verbal cues    Comprehension Verbalized understanding;Returned demonstration;Verbal cues required              PT Short Term Goals - 07/26/21 1714       PT SHORT TERM GOAL #1   Title Patient will be adherent to HEP at least 3x a week to improve functional strength and balance for better safety at home.    Baseline 11/21: Pt reports completing her HEP 3x a week    Time 4    Period Weeks    Status Achieved    Target Date 07/14/21               PT Long Term Goals - 07/26/21 1715       PT LONG TERM GOAL #1   Title Patient (> 33 years old) will complete five times sit to stand test in < 15 seconds indicating  an increased LE strength and improved balance.    Baseline 11/21: 18.83 sec with arms crossed across chest    Time 12    Period Weeks  Status On-going    Target Date 10/18/21      PT LONG TERM GOAL #2   Title Patient will increase RLE gross strength to 4+/5 as to improve functional strength for independent gait, increased standing tolerance and increased ADL ability.    Baseline 11/21: 4/5 R dorsiflexion, everything else at least 4+/5    Time 12    Period Weeks    Status On-going    Target Date 10/18/21      PT LONG TERM GOAL #3   Title Patient will increase six minute walk test distance to >1300 for progression to community ambulator and improve gait ability closer to age group norms of 1644 feet;    Baseline 11/21: 1080 ft with CGA and no AD    Time 12    Period Weeks    Status On-going    Target Date 10/18/21      PT LONG TERM GOAL #4   Title Patient will ascend/descend 4 stairs without rail assist independently without loss of balance to improve ability to get in/out of home while carrying something.    Baseline 11/21: Pt requires 1 hand for descending stairs.    Time 12    Period Weeks    Status On-going    Target Date 10/18/21      PT LONG TERM GOAL #5   Title Patient will improve FOTO score by 5% to indicate improved functional mobility with ADLs.    Baseline 11/21: 61%    Time 12    Period Weeks    Status On-going    Target Date 10/18/21      PT LONG TERM GOAL #6   Title Patient will increase Functional Gait Assessment score to >22/30 as to reduce fall risk and improve dynamic gait safety with community ambulation.    Baseline 11/21: 20/30    Time 12    Period Weeks    Status On-going    Target Date 10/18/21                   Plan - 08/03/21 1010     Clinical Impression Statement Pt presented to PT with good motivation for all interventions. Treadmill endurance training was initiated today and pt tolerated intervention well although indicating  mild right foot discomfort. Pt was able to progress standing lower extremity strengthening with no increased pain. Pt was most challenged by ambulation with ankle weights today. The pt will benefit from continued skilled PT to improve LE strength, balance, and muscular endurance to improve quality of life and ease of ADLs.    Personal Factors and Comorbidities Age;Comorbidity 3+    Comorbidities history of chronic right foot pain, knee OA, hypercholesterolemia, abnormal heart rhythm (on blood thinners), osteopenia,    Examination-Activity Limitations Caring for Others;Squat;Stairs;Stand;Transfers    Examination-Participation Restrictions Cleaning;Community Activity;Shop;Volunteer    Stability/Clinical Decision Making Stable/Uncomplicated    Rehab Potential Good    PT Frequency 2x / week    PT Duration 12 weeks    PT Treatment/Interventions Cryotherapy;Moist Heat;Electrical Stimulation;Gait training;Stair training;Functional mobility training;Therapeutic activities;Therapeutic exercise;Balance training;Neuromuscular re-education;Patient/family education;Energy conservation    PT Next Visit Plan review HEP- high level balance training: assess Right foot for pain as appropriate. Floor to chair transfers.    PT Home Exercise Plan 06/28/2021: Access Code: [email or web address]    Consulted and Agree with Plan of Care Patient             Patient will benefit from skilled therapeutic intervention in order to  improve the following deficits and impairments:  Abnormal gait, Decreased balance, Decreased endurance, Decreased mobility, Difficulty walking, Cardiopulmonary status limiting activity, Decreased strength, Decreased safety awareness, Decreased activity tolerance  Visit Diagnosis: Muscle weakness (generalized)  Abnormality of gait and mobility  Difficulty in walking, not elsewhere classified  Unsteadiness on feet     Problem List Patient Active Problem List   Diagnosis Date Noted   Chronic  ischemic left MCA stroke 06/18/2021   Cholelithiasis without cholecystitis 06/07/2021   Nodule of apex of right lung 06/07/2021   History of CVA (cerebrovascular accident) 05/25/2021   Atrial fibrillation ([place])    Mass of breast, left 11/13/2020   Colon cancer screening 05/14/2019   Vitamin D deficiency 05/08/2018   Flushing 06/20/2017   H/O fracture of fibula 01/21/2014   Osteopenia 01/07/2014   Family history of osteoporosis 11/29/2013   Estrogen deficiency 11/29/2013   Hyperlipidemia 11/29/2013   Encounter for Medicare annual wellness exam 10/29/2013   Irregular heartbeat 10/07/2013   Left thyroid nodule 09/26/2013   Enlarged thyroid 09/16/2013   Primary osteoarthritis of both knees 11/05/2012   Other screening mammogram 09/27/2011   Routine general medical examination at a health care facility 09/19/2011   Class 1 obesity due to excess calories with serious comorbidity and body mass index (BMI) of 34.0 to 34.9 in adult 06/06/2008   Prediabetes 06/06/2008   Anxiety state 04/17/2007   ALLERGIC RHINITIS 04/17/2007   Ricard Dillon PT, DPT This entire session was performed under direct supervision and direction of a licensed therapist/therapist assistant . I have personally read, edited and approve of the note as written.   Karn Cassis, SPT 08/03/2021, 10:29 AM  [place] MAIN [facility] SERVICES [address] [place], [place], [address] Phone: [phone]   Fax:  [phone]  Name: Deborah Carey MRN: [id number] Date of Birth: March 10, 1951

## 2021-08-06 ENCOUNTER — Ambulatory Visit: Payer: Medicare PPO | Attending: Physical Medicine and Rehabilitation

## 2021-08-06 ENCOUNTER — Encounter: Payer: Medicare PPO | Admitting: Speech Pathology

## 2021-08-06 ENCOUNTER — Other Ambulatory Visit: Payer: Self-pay

## 2021-08-06 DIAGNOSIS — M6281 Muscle weakness (generalized): Secondary | ICD-10-CM | POA: Diagnosis not present

## 2021-08-06 DIAGNOSIS — R2689 Other abnormalities of gait and mobility: Secondary | ICD-10-CM | POA: Insufficient documentation

## 2021-08-06 DIAGNOSIS — R278 Other lack of coordination: Secondary | ICD-10-CM | POA: Insufficient documentation

## 2021-08-06 DIAGNOSIS — R2681 Unsteadiness on feet: Secondary | ICD-10-CM | POA: Insufficient documentation

## 2021-08-06 DIAGNOSIS — R262 Difficulty in walking, not elsewhere classified: Secondary | ICD-10-CM | POA: Insufficient documentation

## 2021-08-06 NOTE — Therapy (Addendum)
[place] MAIN [facility] SERVICES [address] [place], [place], [address] Phone: [phone]   Fax:  [phone]  Physical Therapy Treatment  Patient Details  Name: Deborah Carey MRN: [id number] Date of Birth: 02-Sep-1951 No data recorded  Encounter Date: 08/06/2021   PT End of Session - 08/06/21 1033     Visit Number 12    Number of Visits 35    Date for PT Re-Evaluation 10/18/21    Authorization Type [employer]    Authorization Time Period 06/17/21-07/28/21: 07/26/21-10/18/21    Progress Note Due on Visit 20    PT Start Time 0931    PT Stop Time 1015    PT Time Calculation (min) 44 min    Equipment Utilized During Treatment Gait belt    Activity Tolerance Patient tolerated treatment well    Behavior During Therapy WFL for tasks assessed/performed             Past Medical History:  Diagnosis Date   Allergy    allergic rhinitis   Anxiety    Cataract    removed both eyes    Infertility, female    Osteoarthritis of both knees    OA    PONV (postoperative nausea and vomiting)    Post-menopausal bleeding    neg endo bx.    Past Surgical History:  Procedure Laterality Date   cataract surgery  2011   CESAREAN SECTION     times 2   COLONOSCOPY  2010   hems    ENDOMETRIAL BIOPSY     neg for CA cells   FACIAL COSMETIC SURGERY  2006   face lift   FOOT SURGERY  1998   rt heel spur removal   IR CT HEAD LTD  05/25/2021   IR PERCUTANEOUS ART THROMBECTOMY/INFUSION INTRACRANIAL INC DIAG ANGIO  05/25/2021   IR US GUIDE VASC ACCESS RIGHT  05/25/2021   RADIOLOGY WITH ANESTHESIA N/A 05/25/2021   Procedure: IR WITH ANESTHESIA;  Surgeon: Aletta Edouard, MD;  Location: [place];  Service: Radiology;  Laterality: N/A;   REFRACTIVE SURGERY  08/1999    There were no vitals filed for this visit.   Subjective Assessment - 08/06/21 1032     Subjective Pt states that she is doing well today. She denies any pain and has not had any new falls  or stumbles.    Patient is accompained by: Deborah Carey   Pertinent History 70 yo Female s/p CVA on 05/25/21; She was admitted to hospital for approximately 1 week and then discharged to inpatient rehab for 1 week. She was discharged home in October 2022. She reports since being home she has been doing well. She presents to therapy without assistive device. She denies using any assistive device at home. Denies any recent falls. She denies any stumbles or unsteadiness. She reports intermittent fatigue requiring rest at times. Pt reports she is not cooking at this time. She also reports she is not cleaning much. She reports trying some activities but fatigues quickly. She reports she does sleep well at night.She denies any numbness/tingling;    Limitations Standing;Walking    How long can you sit comfortably? NA    How long can you stand comfortably? 10-15 min;    How long can you walk comfortably? approximately 10 min, >500 feet; Does fatigue with long distance walking    Diagnostic tests MRI on 05/26/21: Acute infarction of the left putamen and caudate. Minimal petechial  blood products  but no frank hematoma. Mild swelling but no midline  shift.     Three punctate acute infarctions within the medial right frontal  lobe, probably micro embolic infarctions in the anterior cerebral  artery territory.     Old infarction in the right parietal cortical and subcortical brain  and seen affecting the cerebral hemispheric deep white matter  extensively.    Patient Stated Goals improve energy; be able to walk better- faster and longer;    Currently in Pain? No/denies    Pain Onset More than a month ago              INTERVENTION   Therex:   Treadmill walking for 6 minutes to improve cardiorespiratory endurance with CGA. Pt ambulates up to 1.8 mph for a total distance of 0.23m. Pt rated exercise 4-6/10 on RPE scale.   STS x 10 without UE support and CGA. Pt reports as medium. VC to stand straight up to avoid  leaning toward the L side. She reports mild R knee discomfort that subsides quickly    TRX assisted partial squat 3 x 10 reps with CGA. Pt reports she feels it in her knees.  LAQ x 12 with 4 lb ankle weight with 3 second hold. Pt displays good control in both Les  Toe taps on 6 in step with BUE support and CGA x 10 reps each side with 4 lb ankle weights   Step ups BUE support and CGA 2 x 10 reps each side with 4 lb ankle weights. Pt rated exercise as hard and required rest break between sets.  LAQ x 12 on R unweighted. Pt states she is getting more range and the motion is improving.  Neuromuscular Re-ed:  The following were completed in the // bars with CGA:  Airex pad heel rockers without UE support 2 x 10 reps  Lateral stepping over half foam, flat side down 3 x 12 each side with BLE support. VC for taking a big step on the R to leave enough room for the LLE. Pt displays intermittent difficulty with RLE foot clearance.  Ladder drills - reciprocal forward stepping x 4 lengths without UE support. Pt requiring step strategy to regain balance x 3. VC to take big steps.  Ladder drills - side stepping x 4 lengths without UE support. VC to take big step to the side to leave room for the other LE.       PT Education - 08/06/21 1033     Education Details exercise technique, body mechnics    Person(s) Educated Patient    Methods Explanation;Demonstration;Verbal cues;Tactile cues    Comprehension Verbalized understanding;Returned demonstration;Need further instruction              PT Short Term Goals - 07/26/21 1714       PT SHORT TERM GOAL #1   Title Patient will be adherent to HEP at least 3x a week to improve functional strength and balance for better safety at home.    Baseline 11/21: Pt reports completing her HEP 3x a week    Time 4    Period Weeks    Status Achieved    Target Date 07/14/21               PT Long Term Goals - 07/26/21 1715       PT LONG TERM  GOAL #1   Title Patient (> 40 years old) will complete five times sit to stand test in < 15 seconds  indicating an increased LE strength and improved balance.    Baseline 11/21: 18.83 sec with arms crossed across chest    Time 12    Period Weeks    Status On-going    Target Date 10/18/21      PT LONG TERM GOAL #2   Title Patient will increase RLE gross strength to 4+/5 as to improve functional strength for independent gait, increased standing tolerance and increased ADL ability.    Baseline 11/21: 4/5 R dorsiflexion, everything else at least 4+/5    Time 12    Period Weeks    Status On-going    Target Date 10/18/21      PT LONG TERM GOAL #3   Title Patient will increase six minute walk test distance to >1300 for progression to community ambulator and improve gait ability closer to age group norms of 1644 feet;    Baseline 11/21: 1080 ft with CGA and no AD    Time 12    Period Weeks    Status On-going    Target Date 10/18/21      PT LONG TERM GOAL #4   Title Patient will ascend/descend 4 stairs without rail assist independently without loss of balance to improve ability to get in/out of home while carrying something.    Baseline 11/21: Pt requires 1 hand for descending stairs.    Time 12    Period Weeks    Status On-going    Target Date 10/18/21      PT LONG TERM GOAL #5   Title Patient will improve FOTO score by 5% to indicate improved functional mobility with ADLs.    Baseline 11/21: 61%    Time 12    Period Weeks    Status On-going    Target Date 10/18/21      PT LONG TERM GOAL #6   Title Patient will increase Functional Gait Assessment score to >22/30 as to reduce fall risk and improve dynamic gait safety with community ambulation.    Baseline 11/21: 20/30    Time 12    Period Weeks    Status On-going    Target Date 10/18/21                   Plan - 08/06/21 1035     Clinical Impression Statement Pt able to increase treadmill endurance training both in  velocity and distance. Pt able to continue LE strengthening, but displayed some knee discomfort with partial squats. Pt was challenged by step ups and continues to be challenged by foot clearance both forward and to the side. The pt will benefit from continued skille PT to improve LE strength, balance, and muscular endurance, and foot clearance to improve quality of life, safety, and ease of ADLs    Personal Factors and Comorbidities Age;Comorbidity 3+    Comorbidities history of chronic right foot pain, knee OA, hypercholesterolemia, abnormal heart rhythm (on blood thinners), osteopenia,    Examination-Activity Limitations Caring for Others;Squat;Stairs;Stand;Transfers    Examination-Participation Restrictions Cleaning;Community Activity;Shop;Volunteer    Stability/Clinical Decision Making Stable/Uncomplicated    Rehab Potential Good    PT Frequency 2x / week    PT Duration 12 weeks    PT Treatment/Interventions Cryotherapy;Moist Heat;Electrical Stimulation;Gait training;Stair training;Functional mobility training;Therapeutic activities;Therapeutic exercise;Balance training;Neuromuscular re-education;Patient/family education;Energy conservation    PT Next Visit Plan review HEP- high level balance training: assess Right foot for pain as appropriate. Floor to chair transfers.    PT Home Exercise Plan 06/28/2021: Access Code: [email or web address]  Consulted and Agree with Plan of Care Patient             Patient will benefit from skilled therapeutic intervention in order to improve the following deficits and impairments:  Abnormal gait, Decreased balance, Decreased endurance, Decreased mobility, Difficulty walking, Cardiopulmonary status limiting activity, Decreased strength, Decreased safety awareness, Decreased activity tolerance  Visit Diagnosis: Muscle weakness (generalized)  Difficulty in walking, not elsewhere classified  Unsteadiness on feet     Problem List Patient Active Problem List    Diagnosis Date Noted   Chronic ischemic left MCA stroke 06/18/2021   Cholelithiasis without cholecystitis 06/07/2021   Nodule of apex of right lung 06/07/2021   History of CVA (cerebrovascular accident) 05/25/2021   Atrial fibrillation ([place])    Mass of breast, left 11/13/2020   Colon cancer screening 05/14/2019   Vitamin D deficiency 05/08/2018   Flushing 06/20/2017   H/O fracture of fibula 01/21/2014   Osteopenia 01/07/2014   Family history of osteoporosis 11/29/2013   Estrogen deficiency 11/29/2013   Hyperlipidemia 11/29/2013   Encounter for Medicare annual wellness exam 10/29/2013   Irregular heartbeat 10/07/2013   Left thyroid nodule 09/26/2013   Enlarged thyroid 09/16/2013   Primary osteoarthritis of both knees 11/05/2012   Other screening mammogram 09/27/2011   Routine general medical examination at a health care facility 09/19/2011   Class 1 obesity due to excess calories with serious comorbidity and body mass index (BMI) of 34.0 to 34.9 in adult 06/06/2008   Prediabetes 06/06/2008   Anxiety state 04/17/2007   ALLERGIC RHINITIS 04/17/2007  Ricard Dillon PT, DPT This entire session was performed under direct supervision and direction of a licensed therapist/therapist assistant . I have personally read, edited and approve of the note as written.   Karn Cassis, SPT  08/06/2021, 10:40 AM  [place] MAIN [facility] SERVICES [address] [place], [place], [address] Phone: [phone]   Fax:  [phone]  Name: Deborah Carey MRN: [id number] Date of Birth: 1951-06-01

## 2021-08-11 ENCOUNTER — Ambulatory Visit: Payer: Medicare PPO

## 2021-08-11 ENCOUNTER — Encounter: Payer: Medicare PPO | Admitting: Speech Pathology

## 2021-08-11 ENCOUNTER — Other Ambulatory Visit: Payer: Self-pay

## 2021-08-11 DIAGNOSIS — M6281 Muscle weakness (generalized): Secondary | ICD-10-CM

## 2021-08-11 DIAGNOSIS — R262 Difficulty in walking, not elsewhere classified: Secondary | ICD-10-CM

## 2021-08-11 DIAGNOSIS — R2681 Unsteadiness on feet: Secondary | ICD-10-CM

## 2021-08-11 DIAGNOSIS — R278 Other lack of coordination: Secondary | ICD-10-CM | POA: Diagnosis not present

## 2021-08-11 DIAGNOSIS — R2689 Other abnormalities of gait and mobility: Secondary | ICD-10-CM | POA: Diagnosis not present

## 2021-08-11 NOTE — Therapy (Cosign Needed Addendum)
[place] MAIN [facility] SERVICES [address] [place], [place], [address] Phone: [phone]   Fax:  [phone]  Physical Therapy Treatment  Patient Details  Name: Deborah Carey MRN: [id number] Date of Birth: 1951/02/17 No data recorded  Encounter Date: 08/11/2021   PT End of Session - 08/11/21 1051     Visit Number 13    Number of Visits 35    Date for PT Re-Evaluation 10/18/21    Authorization Type [employer]    Authorization Time Period 06/17/21-07/28/21: 07/26/21-10/18/21    Progress Note Due on Visit 20    PT Start Time 1001    PT Stop Time 1044    PT Time Calculation (min) 43 min    Equipment Utilized During Treatment Gait belt    Activity Tolerance Patient tolerated treatment well;Patient limited by fatigue    Behavior During Therapy WFL for tasks assessed/performed             Past Medical History:  Diagnosis Date   Allergy    allergic rhinitis   Anxiety    Cataract    removed both eyes    Infertility, female    Osteoarthritis of both knees    OA    PONV (postoperative nausea and vomiting)    Post-menopausal bleeding    neg endo bx.    Past Surgical History:  Procedure Laterality Date   cataract surgery  2011   CESAREAN SECTION     times 2   COLONOSCOPY  2010   hems    ENDOMETRIAL BIOPSY     neg for CA cells   FACIAL COSMETIC SURGERY  2006   face lift   FOOT SURGERY  1998   rt heel spur removal   IR CT HEAD LTD  05/25/2021   IR PERCUTANEOUS ART THROMBECTOMY/INFUSION INTRACRANIAL INC DIAG ANGIO  05/25/2021   IR US GUIDE VASC ACCESS RIGHT  05/25/2021   RADIOLOGY WITH ANESTHESIA N/A 05/25/2021   Procedure: IR WITH ANESTHESIA;  Surgeon: Aletta Edouard, MD;  Location: [place];  Service: Radiology;  Laterality: N/A;   REFRACTIVE SURGERY  08/1999    There were no vitals filed for this visit.   Subjective Assessment - 08/11/21 1004     Subjective Pt reports she had pain in her R thigh following treatment  Friday that lasted until Monday. She states that she was able to do some side stepping on Monday but wasn't able to sleep very well due to pain and is worried she may have overdone it. She denies any pain today.    Patient is accompained by: [name]   Pertinent History 70 yo Female s/p CVA on 05/25/21; She was admitted to hospital for approximately 1 week and then discharged to inpatient rehab for 1 week. She was discharged home in October 2022. She reports since being home she has been doing well. She presents to therapy without assistive device. She denies using any assistive device at home. Denies any recent falls. She denies any stumbles or unsteadiness. She reports intermittent fatigue requiring rest at times. Pt reports she is not cooking at this time. She also reports she is not cleaning much. She reports trying some activities but fatigues quickly. She reports she does sleep well at night.She denies any numbness/tingling;    Limitations Standing;Walking    How long can you sit comfortably? NA    How long can you stand comfortably? 10-15 min;    How long can you  walk comfortably? approximately 10 min, >500 feet; Does fatigue with long distance walking    Diagnostic tests MRI on 05/26/21: Acute infarction of the left putamen and caudate. Minimal petechial  blood products but no frank hematoma. Mild swelling but no midline  shift.     Three punctate acute infarctions within the medial right frontal  lobe, probably micro embolic infarctions in the anterior cerebral  artery territory.     Old infarction in the right parietal cortical and subcortical brain  and seen affecting the cerebral hemispheric deep white matter  extensively.    Patient Stated Goals improve energy; be able to walk better- faster and longer;    Currently in Pain? No/denies    Pain Onset More than a month ago               INTERVENTION   Therex:   Treadmill walking for 7 minutes to improve cardiorespiratory endurance  with CGA. Pt ambulates up to 1.5 mph for a total distance of 0.41m. Pt rated exercise 7-8/10 on RPE scale. Rest period followed  LAQ x 12 with 5 lb ankle weight. Pt displays good control in both LEs   Toe taps on cones with BUE support and CGA x 12 reps each side    Neuromuscular Re-ed:   The following were completed on airex at support surface with CGA:   NBOS 2 x 30 sec. Pt reports as easy Semitandem x 45 sec each side Semitandem 2 x 45 sec each side horizontal head turns Semitandem 2 x 45 sec each side vertical head turns Airex pad heel rockers without UE support 2 x 10 reps Airex weight shifts L and R with cues to bring the opposite heel off of the ground x 12 reps each side  - Progressed to bringing the opposite foot completely off the ground x 12 reps each side. Pt requiring intermittent UE support   Lateral stepping over half foam, flat side down 2 x 10 each side with BLE support. VC for taking a big step on the R to leave enough room for the LLE. Pt displays intermittent difficulty with RLE foot clearance.   Pt required more rest breaks due to fatigue today. Pt responded well to all interventions today with no increased pain. Pt instructed to keep track of her fatigue and soreness levels between now and next session.      PT Education - 08/11/21 1051     Education Details exercies technique, body mechanics    Person(s) Educated Patient    Methods Explanation;Verbal cues;Demonstration    Comprehension Verbalized understanding;Returned demonstration;Need further instruction              PT Short Term Goals - 07/26/21 1714       PT SHORT TERM GOAL #1   Title Patient will be adherent to HEP at least 3x a week to improve functional strength and balance for better safety at home.    Baseline 11/21: Pt reports completing her HEP 3x a week    Time 4    Period Weeks    Status Achieved    Target Date 07/14/21               PT Long Term Goals - 07/26/21 1715        PT LONG TERM GOAL #1   Title Patient (> 53 years old) will complete five times sit to stand test in < 15 seconds indicating an increased LE strength and improved balance.  Baseline 11/21: 18.83 sec with arms crossed across chest    Time 12    Period Weeks    Status On-going    Target Date 10/18/21      PT LONG TERM GOAL #2   Title Patient will increase RLE gross strength to 4+/5 as to improve functional strength for independent gait, increased standing tolerance and increased ADL ability.    Baseline 11/21: 4/5 R dorsiflexion, everything else at least 4+/5    Time 12    Period Weeks    Status On-going    Target Date 10/18/21      PT LONG TERM GOAL #3   Title Patient will increase six minute walk test distance to >1300 for progression to community ambulator and improve gait ability closer to age group norms of 1644 feet;    Baseline 11/21: 1080 ft with CGA and no AD    Time 12    Period Weeks    Status On-going    Target Date 10/18/21      PT LONG TERM GOAL #4   Title Patient will ascend/descend 4 stairs without rail assist independently without loss of balance to improve ability to get in/out of home while carrying something.    Baseline 11/21: Pt requires 1 hand for descending stairs.    Time 12    Period Weeks    Status On-going    Target Date 10/18/21      PT LONG TERM GOAL #5   Title Patient will improve FOTO score by 5% to indicate improved functional mobility with ADLs.    Baseline 11/21: 61%    Time 12    Period Weeks    Status On-going    Target Date 10/18/21      PT LONG TERM GOAL #6   Title Patient will increase Functional Gait Assessment score to >22/30 as to reduce fall risk and improve dynamic gait safety with community ambulation.    Baseline 11/21: 20/30    Time 12    Period Weeks    Status On-going    Target Date 10/18/21                   Plan - 08/11/21 1052     Clinical Impression Statement Pt presented with good motivation for  interventions today despite feeling under the weather. She displayed more fatigue today than usual likely due to not feeling well and not sleeping well the last few nights. Pt able to progress treadmill endurance training in duration despite being challenged by the exercise. Pt was also challenged by toe taps on cones with the RLE. The pt will benefit from continued skilled PT to improve LE strength, balance, and muscular endurance, and foot clearance to improve quality of life, safety, and ease of ADLs.    Personal Factors and Comorbidities Age;Comorbidity 3+    Comorbidities history of chronic right foot pain, knee OA, hypercholesterolemia, abnormal heart rhythm (on blood thinners), osteopenia,    Examination-Activity Limitations Caring for Others;Squat;Stairs;Stand;Transfers    Examination-Participation Restrictions Cleaning;Community Activity;Shop;Volunteer    Stability/Clinical Decision Making Stable/Uncomplicated    Rehab Potential Good    PT Frequency 2x / week    PT Duration 12 weeks    PT Treatment/Interventions Cryotherapy;Moist Heat;Electrical Stimulation;Gait training;Stair training;Functional mobility training;Therapeutic activities;Therapeutic exercise;Balance training;Neuromuscular re-education;Patient/family education;Energy conservation    PT Next Visit Plan review HEP- high level balance training: assess Right foot for pain as appropriate. Floor to chair transfers.    PT Home Exercise Plan  06/28/2021: Access Code: [email or web address]    Consulted and Agree with Plan of Care Patient             Patient will benefit from skilled therapeutic intervention in order to improve the following deficits and impairments:  Abnormal gait, Decreased balance, Decreased endurance, Decreased mobility, Difficulty walking, Cardiopulmonary status limiting activity, Decreased strength, Decreased safety awareness, Decreased activity tolerance  Visit Diagnosis: Unsteadiness on feet  Difficulty in walking,  not elsewhere classified  Muscle weakness (generalized)     Problem List Patient Active Problem List   Diagnosis Date Noted   Chronic ischemic left MCA stroke 06/18/2021   Cholelithiasis without cholecystitis 06/07/2021   Nodule of apex of right lung 06/07/2021   History of CVA (cerebrovascular accident) 05/25/2021   Atrial fibrillation ([name])    Mass of breast, left 11/13/2020   Colon cancer screening 05/14/2019   Vitamin D deficiency 05/08/2018   [place] 06/20/2017   H/O fracture of fibula 01/21/2014   Osteopenia 01/07/2014   Family history of osteoporosis 11/29/2013   Estrogen deficiency 11/29/2013   Hyperlipidemia 11/29/2013   Encounter for Medicare annual wellness exam 10/29/2013   Irregular heartbeat 10/07/2013   Left thyroid nodule 09/26/2013   Enlarged thyroid 09/16/2013   Primary osteoarthritis of both knees 11/05/2012   Other screening mammogram 09/27/2011   Routine general medical examination at a health care facility 09/19/2011   Class 1 obesity due to excess calories with serious comorbidity and body mass index (BMI) of 34.0 to 34.9 in adult 06/06/2008   Prediabetes 06/06/2008   Anxiety state 04/17/2007   ALLERGIC RHINITIS 04/17/2007  Ricard Dillon PT, DPT This entire session was performed under direct supervision and direction of a licensed therapist/therapist assistant . I have personally read, edited and approve of the note as written.  Karn Cassis, SPT  08/11/2021, 11:08 AM  [place] MAIN [facility] SERVICES [address] [place], [place], [address] Phone: [phone]   Fax:  [phone]  Name: Deborah Carey MRN: [id number] Date of Birth: 1951/09/04

## 2021-08-12 ENCOUNTER — Encounter: Payer: Self-pay | Admitting: Internal Medicine

## 2021-08-12 ENCOUNTER — Ambulatory Visit: Payer: Medicare PPO | Admitting: Internal Medicine

## 2021-08-12 VITALS — BP 138/88 | HR 73 | Temp 98.1°F | Ht 64.0 in | Wt 197.0 lb

## 2021-08-12 DIAGNOSIS — G4719 Other hypersomnia: Secondary | ICD-10-CM | POA: Diagnosis not present

## 2021-08-12 NOTE — Patient Instructions (Addendum)
Obtain home sleep test Recommend weight loss

## 2021-08-12 NOTE — Progress Notes (Signed)
Name: Deborah Carey MRN: [id number] DOB: 1951/06/21     CONSULTATION DATE: 08/12/2021    CHIEF COMPLAINT: Excessive daytime sleepiness  HISTORY OF PRESENT ILLNESS:    Patient is seen today for problems and issues with sleep related to excessive daytime sleepiness Patient  has been having sleep problems for many years Patient has been having excessive daytime sleepiness for a long time Patient has been having extreme fatigue and tiredness, lack of energy +  very Loud snoring every night    Discussed sleep data and reviewed with patient.  Encouraged proper weight management.  Discussed driving precautions and its relationship with hypersomnolence.  Discussed operating dangerous equipment and its relationship with hypersomnolence.  Discussed sleep hygiene, and benefits of a fixed sleep waked time.  The importance of getting eight or more hours of sleep discussed with patient.  Discussed limiting the use of the computer and television before bedtime.  Decrease naps during the day, so night time sleep will become enhanced.  Limit caffeine, and sleep deprivation.  HTN, stroke, and heart failure are potential risk factors.    EPWORTH SLEEP SCORE 3 Patient has a history of A. Fib Currently on oral anticoagulation Referred to [place] by cardiology to assess for sleep apnea   PAST MEDICAL HISTORY :   has a past medical history of Allergy, Anxiety, Cataract, Infertility, female, Osteoarthritis of both knees, PONV (postoperative nausea and vomiting), and Post-menopausal bleeding.  has a past surgical history that includes Foot surgery (1998); Refractive surgery (08/1999); Facial cosmetic surgery (2006); Endometrial biopsy; Cesarean section; cataract surgery (2011); Colonoscopy (2010); IR US Guide Vasc Access Right (05/25/2021); IR PERCUTANEOUS ART THROMBECTOMY/INFUSION INTRACRANIAL INC DIAG ANGIO (05/25/2021); IR [place] (05/25/2021); and Radiology with anesthesia (N/A, 05/25/2021). Prior  to Admission medications   Medication Sig Start Date End Date Taking? Authorizing Provider  acetaminophen (TYLENOL) 325 MG tablet Take 1-2 tablets (325-650 mg total) by mouth every 4 (four) hours as needed for mild pain. 06/02/21  Yes Love, Ivan Anchors, PA-C  apixaban (ELIQUIS) 5 MG TABS tablet Take 1 tablet (5 mg total) by mouth 2 (two) times daily. 06/17/21  Yes Tower, Wynelle Fanny, MD  atorvastatin (LIPITOR) 40 MG tablet Take 1 tablet (40 mg total) by mouth daily. 06/17/21  Yes Tower, Wynelle Fanny, MD  metoprolol succinate (TOPROL-XL) 25 MG 24 hr tablet Take 1 tablet (25 mg total) by mouth daily. 06/17/21  Yes Tower, Wynelle Fanny, MD  pantoprazole (PROTONIX) 40 MG tablet Take 1 tablet (40 mg total) by mouth daily. For reflux--can use as needed if symptoms controlled. 06/17/21  Yes Tower, Wynelle Fanny, MD  traZODone (DESYREL) 50 MG tablet Take 0.5-1 tablets (25-50 mg total) by mouth at bedtime as needed for sleep. 07/02/21  Yes Tower, Wynelle Fanny, MD   No Known Allergies  FAMILY HISTORY:  family history includes Alzheimer's disease in her father; COPD in her brother; Depression in her sister; Hypertension in her father, mother, and sister; Hypothyroidism in her sister and sister; Osteoporosis in her father, mother, sister, and sister; Parkinson's disease in her sister; Stroke in her mother; Thyroid disease in her sister. SOCIAL HISTORY:  reports that she has never smoked. She has never used smokeless tobacco. She reports that she does not drink alcohol and does not use drugs.   Review of Systems:  Gen:  Denies  fever, sweats, chills weight loss  HEENT: Denies blurred vision, double vision, ear pain, eye pain, hearing loss, nose bleeds, sore throat Cardiac:  No dizziness,  chest pain or heaviness, chest tightness,edema, No JVD Resp:   No cough, -sputum production, -shortness of breath,-wheezing, -hemoptysis,  Gi: Denies swallowing difficulty, stomach pain, nausea or vomiting, diarrhea, constipation, bowel  incontinence Other:  All other systems negative   ALL OTHER ROS ARE NEGATIVE  BP 138/88 (BP Location: Left Arm, Cuff Size: Normal)   Pulse 73   Temp 98.1 F (36.7 C) (Temporal)   Ht 5\' 4"  (1.626 m)   Wt 197 lb (89.4 kg)   LMP 09/05/2008   SpO2 98%   BMI 33.81 kg/m     Physical Examination:   General Appearance: No distress  EYES PERRLA, EOM intact.   NECK Supple, No JVD Pulmonary: normal breath sounds, No wheezing.  CardiovascularNormal S1,S2.  No m/r/g.   Abdomen: Benign, Soft, non-tender. Skin:   warm, no rashes, no ecchymosis  ALL OTHER ROS ARE NEGATIVE      ASSESSMENT AND PLAN SYNOPSIS 71 year old pleasant white female seen today for assessment for excessive daytime sleepiness in the setting of sinus symptoms of sleep apnea  Recommend home sleep test for definitive diagnosis for sleep apnea  Obesity -recommend significant weight loss -recommend changing diet  Deconditioned state -Recommend increased daily activity and exercise    MEDICATION ADJUSTMENTS/LABS AND TESTS ORDERED:    CURRENT MEDICATIONS REVIEWED AT LENGTH WITH PATIENT TODAY   Patient  satisfied with Plan of action and management. All questions answered  Follow up 6 MONTHS   Total Time Spent [place], M.D.  [facility]  Medical Director [place] Director [facility] Cardio-Pulmonary Department

## 2021-08-13 ENCOUNTER — Other Ambulatory Visit: Payer: Self-pay

## 2021-08-13 ENCOUNTER — Ambulatory Visit: Payer: Medicare PPO

## 2021-08-13 ENCOUNTER — Encounter: Payer: Medicare PPO | Admitting: Speech Pathology

## 2021-08-13 DIAGNOSIS — M6281 Muscle weakness (generalized): Secondary | ICD-10-CM | POA: Diagnosis not present

## 2021-08-13 DIAGNOSIS — R2681 Unsteadiness on feet: Secondary | ICD-10-CM

## 2021-08-13 DIAGNOSIS — R278 Other lack of coordination: Secondary | ICD-10-CM | POA: Diagnosis not present

## 2021-08-13 DIAGNOSIS — R2689 Other abnormalities of gait and mobility: Secondary | ICD-10-CM

## 2021-08-13 DIAGNOSIS — R262 Difficulty in walking, not elsewhere classified: Secondary | ICD-10-CM | POA: Diagnosis not present

## 2021-08-13 NOTE — Therapy (Addendum)
[place] MAIN [facility] SERVICES [address] [place], [place], [address] Phone: [phone]   Fax:  [phone]  Physical Therapy Treatment  Patient Details  Name: Deborah Carey MRN: [id number] Date of Birth: [birth year] No data recorded  Encounter Date: 08/13/2021   PT End of Session - 08/13/21 1024     Visit Number 14    Number of Visits 35    Date for PT Re-Evaluation 10/18/21    Authorization Type [employer]    Authorization Time Period 06/17/21-07/28/21: 07/26/21-10/18/21    Progress Note Due on Visit 20    PT Start Time 0935    PT Stop Time 1017    PT Time Calculation (min) 42 min    Equipment Utilized During Treatment Gait belt    Activity Tolerance Patient tolerated treatment well    Behavior During Therapy WFL for tasks assessed/performed             Past Medical History:  Diagnosis Date   Allergy    allergic rhinitis   Anxiety    Cataract    removed both eyes    Infertility, female    Osteoarthritis of both knees    OA    PONV (postoperative nausea and vomiting)    Post-menopausal bleeding    neg endo bx.    Past Surgical History:  Procedure Laterality Date   cataract surgery  2011   CESAREAN SECTION     times 2   COLONOSCOPY  2010   hems    ENDOMETRIAL BIOPSY     neg for CA cells   FACIAL COSMETIC SURGERY  2006   face lift   FOOT SURGERY  1998   rt heel spur removal   IR CT HEAD LTD  05/25/2021   IR PERCUTANEOUS ART THROMBECTOMY/INFUSION INTRACRANIAL INC DIAG ANGIO  05/25/2021   IR US GUIDE VASC ACCESS RIGHT  05/25/2021   RADIOLOGY WITH ANESTHESIA N/A 05/25/2021   Procedure: IR WITH ANESTHESIA;  Surgeon: Aletta Edouard, MD;  Location: [facility];  Service: Radiology;  Laterality: N/A;   REFRACTIVE SURGERY  08/1999    There were no vitals filed for this visit.   Subjective Assessment - 08/13/21 0939     Subjective Pt denies any pain today and denies any stumbles or falls. Pt states shes going to a lunch  at church for their Christmas service this sunday.    Patient is accompained by: [name]   Pertinent History 70 yo Female s/p CVA on 05/25/21; She was admitted to hospital for approximately 1 week and then discharged to inpatient rehab for 1 week. She was discharged home in October 2022. She reports since being home she has been doing well. She presents to therapy without assistive device. She denies using any assistive device at home. Denies any recent falls. She denies any stumbles or unsteadiness. She reports intermittent fatigue requiring rest at times. Pt reports she is not cooking at this time. She also reports she is not cleaning much. She reports trying some activities but fatigues quickly. She reports she does sleep well at night.She denies any numbness/tingling;    Limitations Standing;Walking    How long can you sit comfortably? NA    How long can you stand comfortably? 10-15 min;    How long can you walk comfortably? approximately 10 min, >500 feet; Does fatigue with long distance walking    Diagnostic tests MRI on 05/26/21: Acute infarction of the left putamen and caudate.  Minimal petechial  blood products but no frank hematoma. Mild swelling but no midline  shift.     Three punctate acute infarctions within the medial right frontal  lobe, probably micro embolic infarctions in the anterior cerebral  artery territory.     Old infarction in the right parietal cortical and subcortical brain  and seen affecting the cerebral hemispheric deep white matter  extensively.    Patient Stated Goals improve energy; be able to walk better- faster and longer;    Currently in Pain? No/denies    Pain Onset More than a month ago             INTERVENTION:  Therex:  Nustep HIIT training: 8 min total 2 min level 1 30 sec level 3 1 min level 1 30 sec level 3 1 min level 1 (rated 2-3/10 RPE) 30 sec level 3 (rated 4/10 RPE) 1 min level 1 30 sec level 4 1 min level 1  Exercise tolerance  monitored throughout intervention. Total distance travelled: 0.25 miles SPO2: 98% HR: 80 bpm Cuing for SPM in the 50s and 60s   STS 2 x 8 with hands on knees, no increased pain. Pt reports the exercise is hard.  Neuromuscular Re-ed:  The following were completed in the // bars with CGA:  Lateral stepping over half foam, flat side down 2 x 10 each side without UE support (intermittent UE support to regain balance). VC for taking a big step on the R to leave enough room for the LLE. Pt displays intermittent difficulty with RLE foot clearance.  Forward/backward stepping over half foam x 12 steps each side. VC for taking big step with the R foot. Pt had difficulty stepping backward with the RLE  Forward stepping over half foam x 10 reps without UE support. Pt displays improvements in foot clearance and requires less VC to lift the RLE. Pt continues to show decreased foot clearance with fatigue.  Ladder drills - reciprocal forward stepping x 6 lengths without UE support. Pt requiring step strategy to regain balance x 1. VC to take big steps.   Ladder drills - side stepping x 6 lengths without UE support. VC to take big step to the side to leave room for the other LE.   Ambulation x 98 ft  in hallway with 4# ankle weights with emphasis on foot clearance and heel to toe sequencing. Pt experienced LOB x 2 but corrected with stepping strategy. CGA provided. - Followed by ambulation x 98 ft without ankle weights with continued emphasis on foot clearance.      PT Education - 08/13/21 1024     Education Details exercise technique, body mechanics    Person(s) Educated Patient    Methods Explanation;Verbal cues;Demonstration    Comprehension Verbalized understanding;Returned demonstration;Verbal cues required              PT Short Term Goals - 07/26/21 1714       PT SHORT TERM GOAL #1   Title Patient will be adherent to HEP at least 3x a week to improve functional strength and balance  for better safety at home.    Baseline 11/21: Pt reports completing her HEP 3x a week    Time 4    Period Weeks    Status Achieved    Target Date 07/14/21               PT Long Term Goals - 07/26/21 1715       PT LONG  TERM GOAL #1   Title Patient (> 66 years old) will complete five times sit to stand test in < 15 seconds indicating an increased LE strength and improved balance.    Baseline 11/21: 18.83 sec with arms crossed across chest    Time 12    Period Weeks    Status On-going    Target Date 10/18/21      PT LONG TERM GOAL #2   Title Patient will increase RLE gross strength to 4+/5 as to improve functional strength for independent gait, increased standing tolerance and increased ADL ability.    Baseline 11/21: 4/5 R dorsiflexion, everything else at least 4+/5    Time 12    Period Weeks    Status On-going    Target Date 10/18/21      PT LONG TERM GOAL #3   Title Patient will increase six minute walk test distance to >1300 for progression to community ambulator and improve gait ability closer to age group norms of 1644 feet;    Baseline 11/21: 1080 ft with CGA and no AD    Time 12    Period Weeks    Status On-going    Target Date 10/18/21      PT LONG TERM GOAL #4   Title Patient will ascend/descend 4 stairs without rail assist independently without loss of balance to improve ability to get in/out of home while carrying something.    Baseline 11/21: Pt requires 1 hand for descending stairs.    Time 12    Period Weeks    Status On-going    Target Date 10/18/21      PT LONG TERM GOAL #5   Title Patient will improve FOTO score by 5% to indicate improved functional mobility with ADLs.    Baseline 11/21: 61%    Time 12    Period Weeks    Status On-going    Target Date 10/18/21      PT LONG TERM GOAL #6   Title Patient will increase Functional Gait Assessment score to >22/30 as to reduce fall risk and improve dynamic gait safety with community ambulation.     Baseline 11/21: 20/30    Time 12    Period Weeks    Status On-going    Target Date 10/18/21                   Plan - 08/13/21 1025     Clinical Impression Statement Pt presented with great motivation for all interventions today. Pt excelled at [employer] interval training and will benefit from intervention progressions to improve cardiovascular endurance. Pt continues to improve foot clearance, but is more challenged as she becomes more fatigued. The pt will benefit from continued skilled PT to improve LE strength, balance, muscular endurance, and foot clearance to improve quality of life, safety, and ease of ADLs.    Personal Factors and Comorbidities Age;Comorbidity 3+    Comorbidities history of chronic right foot pain, knee OA, hypercholesterolemia, abnormal heart rhythm (on blood thinners), osteopenia,    Examination-Activity Limitations Caring for Others;Squat;Stairs;Stand;Transfers    Examination-Participation Restrictions Cleaning;Community Activity;Shop;Volunteer    Stability/Clinical Decision Making Stable/Uncomplicated    Rehab Potential Good    PT Frequency 2x / week    PT Duration 12 weeks    PT Treatment/Interventions Cryotherapy;Moist Heat;Electrical Stimulation;Gait training;Stair training;Functional mobility training;Therapeutic activities;Therapeutic exercise;Balance training;Neuromuscular re-education;Patient/family education;Energy conservation    PT Next Visit Plan review HEP- high level balance training: assess Right foot for pain as  appropriate. Floor to chair transfers.    PT Home Exercise Plan 06/28/2021: Access Code: [email or web address]    Consulted and Agree with Plan of Care Patient             Patient will benefit from skilled therapeutic intervention in order to improve the following deficits and impairments:  Abnormal gait, Decreased balance, Decreased endurance, Decreased mobility, Difficulty walking, Cardiopulmonary status limiting activity, Decreased  strength, Decreased safety awareness, Decreased activity tolerance  Visit Diagnosis: Unsteadiness on feet  Muscle weakness (generalized)  Other abnormalities of gait and mobility     Problem List Patient Active Problem List   Diagnosis Date Noted   Chronic ischemic left MCA stroke 06/18/2021   Cholelithiasis without cholecystitis 06/07/2021   Nodule of apex of right lung 06/07/2021   History of CVA (cerebrovascular accident) 05/25/2021   Atrial fibrillation ([place])    Mass of breast, left 11/13/2020   Colon cancer screening 05/14/2019   Vitamin D deficiency 05/08/2018   Flushing 06/20/2017   H/O fracture of fibula 01/21/2014   Osteopenia 01/07/2014   Family history of osteoporosis 11/29/2013   Estrogen deficiency 11/29/2013   Hyperlipidemia 11/29/2013   Encounter for Medicare annual wellness exam 10/29/2013   Irregular heartbeat 10/07/2013   Left thyroid nodule 09/26/2013   Enlarged thyroid 09/16/2013   Primary osteoarthritis of both knees 11/05/2012   Other screening mammogram 09/27/2011   Routine general medical examination at a health care facility 09/19/2011   Class 1 obesity due to excess calories with serious comorbidity and body mass index (BMI) of 34.0 to 34.9 in adult 06/06/2008   Prediabetes 06/06/2008   Anxiety state 04/17/2007   ALLERGIC RHINITIS 04/17/2007  Ricard Dillon PT, DPT This entire session was performed under direct supervision and direction of a licensed therapist/therapist assistant . I have personally read, edited and approve of the note as written.   Karn Cassis, SPT  08/13/2021, 10:50 AM  [place] MAIN [facility] SERVICES [address] [place], [place], [address] Phone: [phone]   Fax:  [phone]  Name: LEILANNY FLUITT MRN: [id number] Date of Birth: [birth year]

## 2021-08-16 ENCOUNTER — Encounter: Payer: Medicare PPO | Admitting: Speech Pathology

## 2021-08-16 ENCOUNTER — Ambulatory Visit
Admission: RE | Admit: 2021-08-16 | Discharge: 2021-08-16 | Disposition: A | Discharge status: Home / Self Care | Payer: Medicare PPO | Source: Ambulatory Visit | Attending: Pulmonary Disease | Admitting: Pulmonary Disease

## 2021-08-16 ENCOUNTER — Other Ambulatory Visit: Payer: Self-pay

## 2021-08-16 ENCOUNTER — Ambulatory Visit: Payer: Medicare PPO | Admitting: Physical Therapy

## 2021-08-16 DIAGNOSIS — R918 Other nonspecific abnormal finding of lung field: Secondary | ICD-10-CM | POA: Diagnosis not present

## 2021-08-16 DIAGNOSIS — I700 Atherosclerosis of aorta: Secondary | ICD-10-CM | POA: Diagnosis not present

## 2021-08-16 DIAGNOSIS — R911 Solitary pulmonary nodule: Secondary | ICD-10-CM | POA: Diagnosis not present

## 2021-08-16 IMAGING — CT CT CHEST SUPER D W/O CM
2 of 5 series · 14 of 36 positions shown, 17 images · non-contrast
Comparison: No available priors.

CLINICAL DATA: Lung nodule.

EXAM:
CT CHEST WITHOUT CONTRAST
TECHNIQUE: Multidetector CT imaging of the chest was performed using thin slice
collimation for electromagnetic bronchoscopy planning purposes,
without intravenous contrast.

[Series 4: thins chest 0.60 · axial · 0.78mm/px · z∈[-1168,-925]mm · 11 of 460 slices shown, 14 images]
[im 28/460  mediastinal]
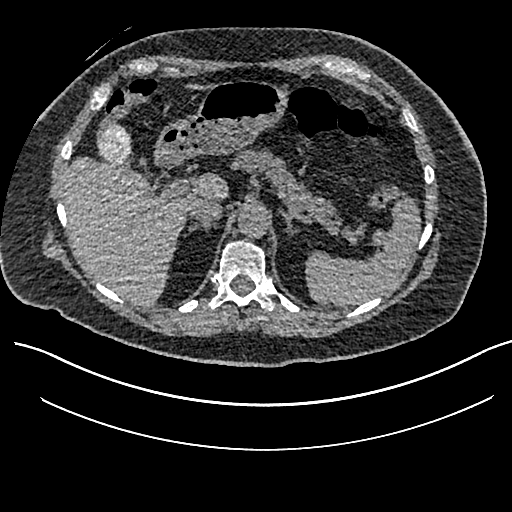
[im 28/460  lung]
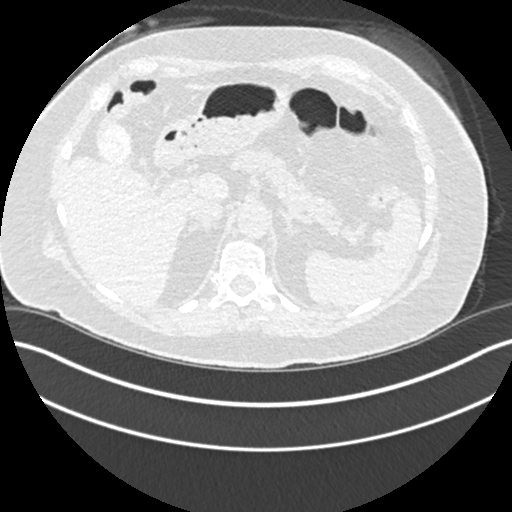
[im 82/460  lung]
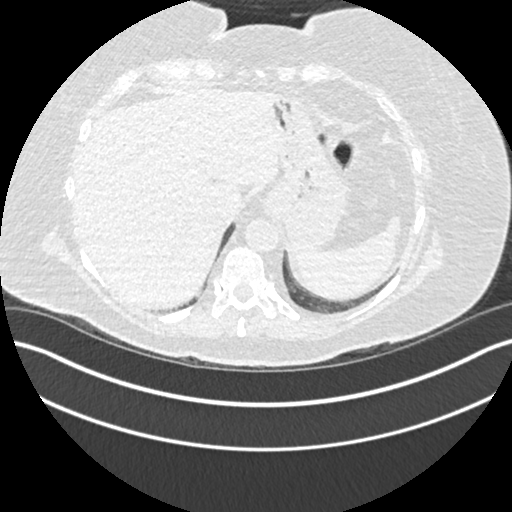
[im 109/460  lung]
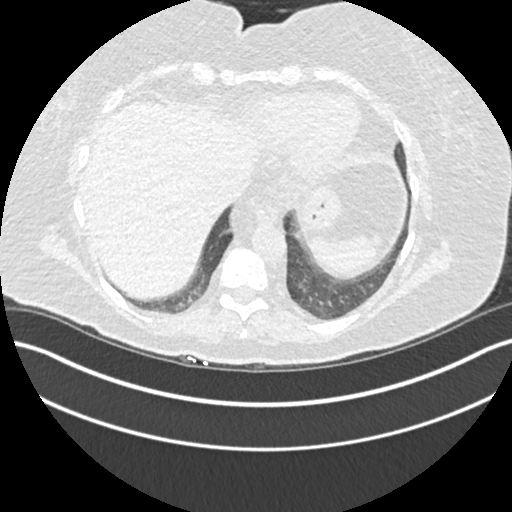
[im 163/460  lung]
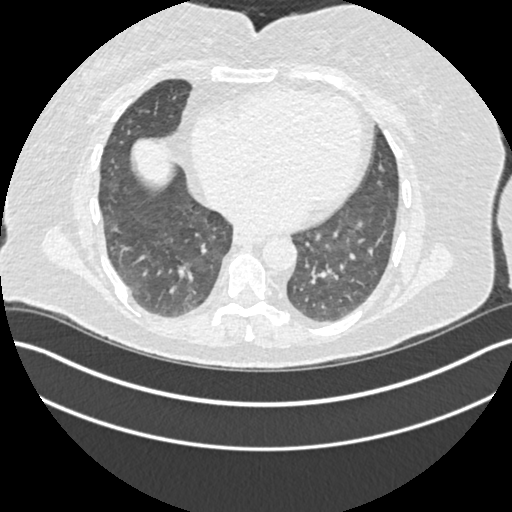
[im 190/460  mediastinal]
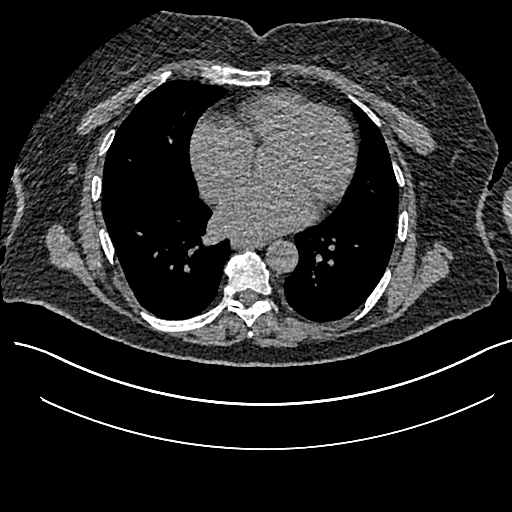
[im 190/460  lung]
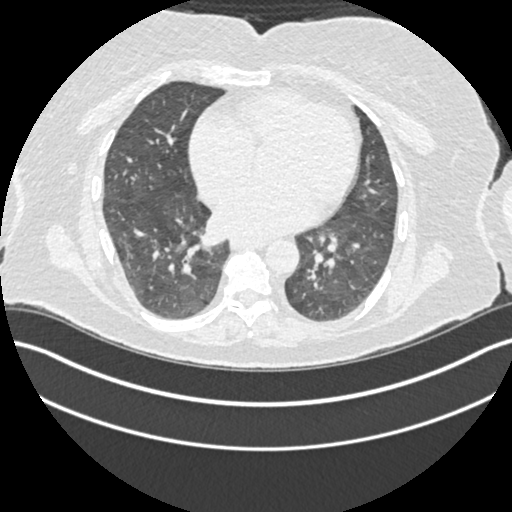
[im 244/460  lung]
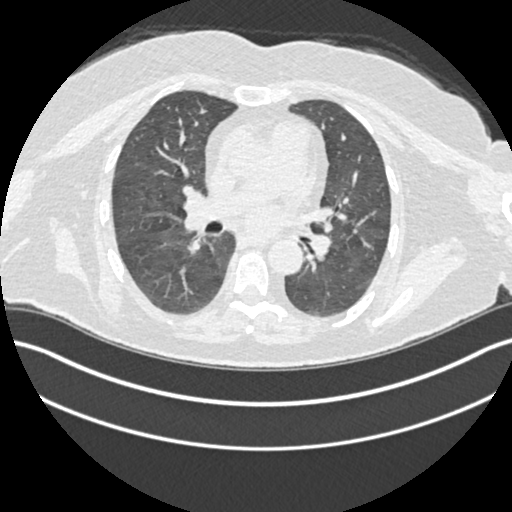
[im 271/460  lung]
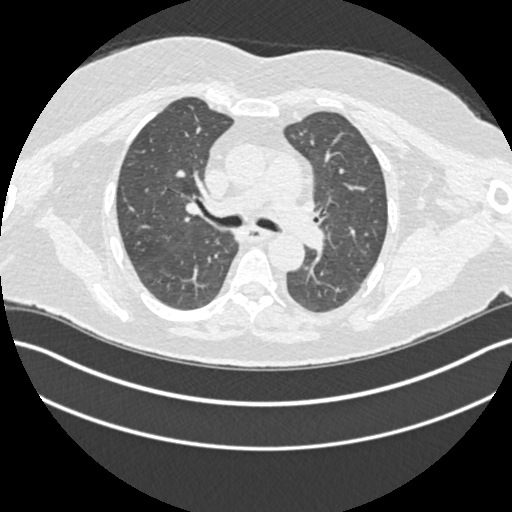
[im 298/460  lung]
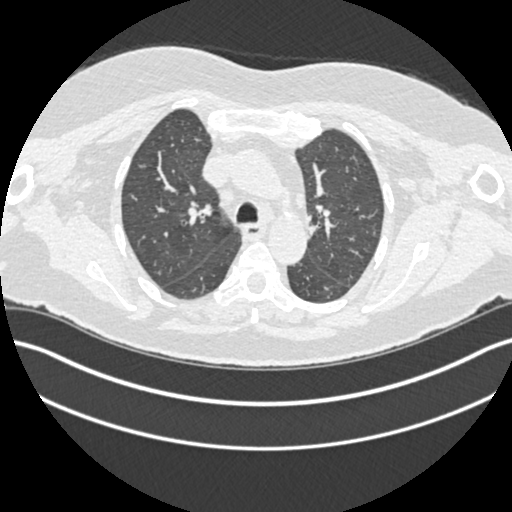
[im 352/460  mediastinal]
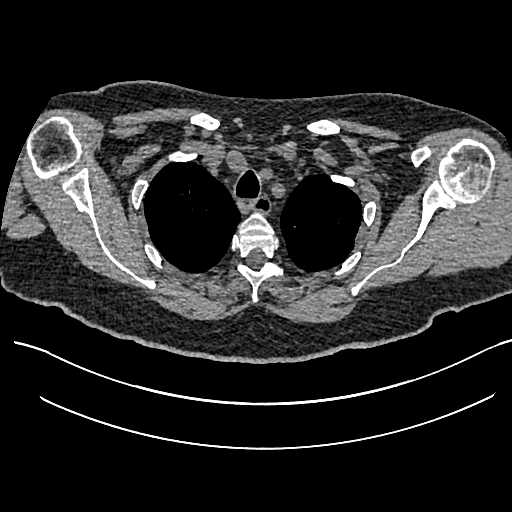
[im 352/460  lung]
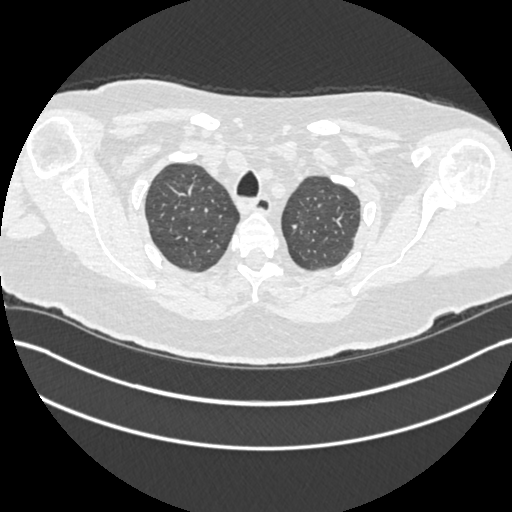
[im 379/460  lung]
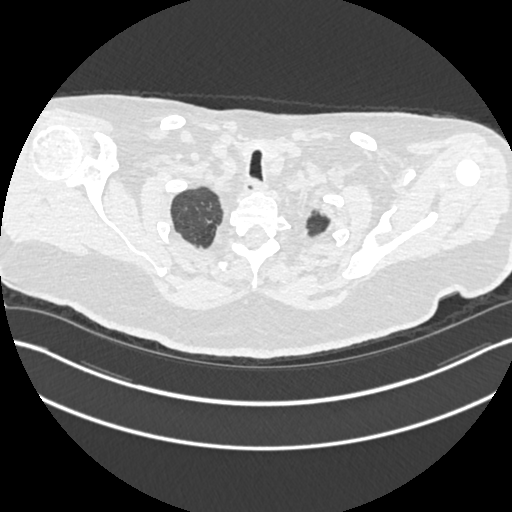
[im 433/460  lung]
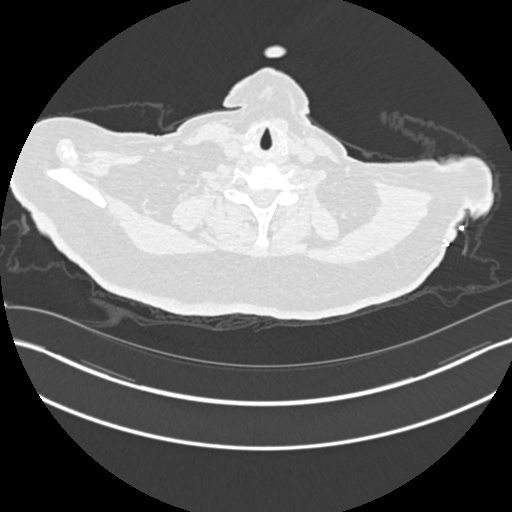

[Series 5: coronals chest 2.00 cor · coronal · 0.54mm/px · 3 of 168 slices shown]
[im 34/168  lung]
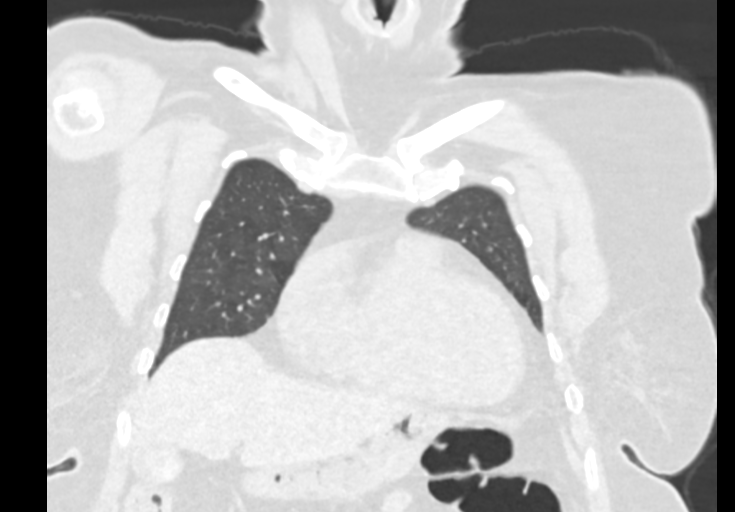
[im 67/168  lung]
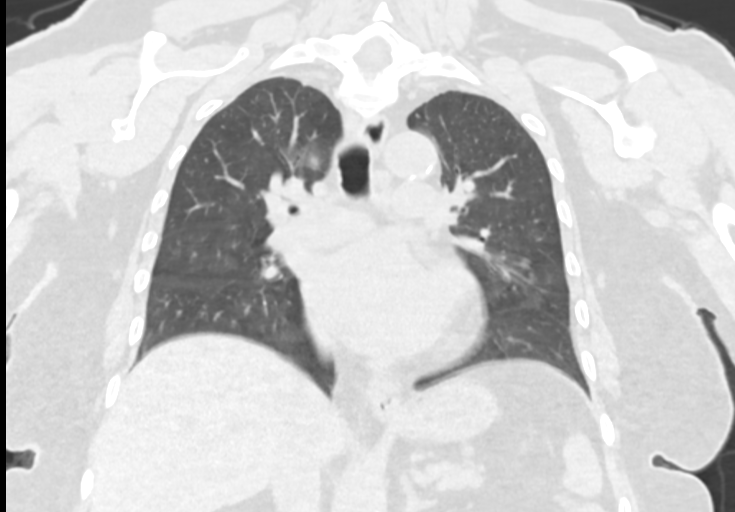
[im 101/168  lung]
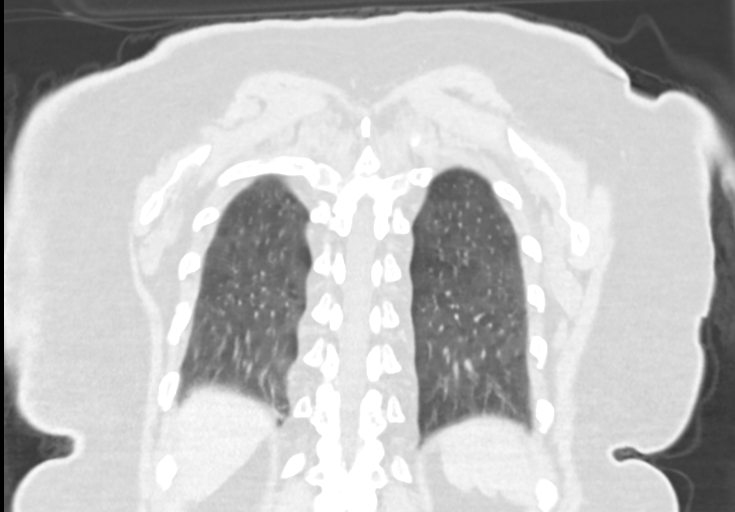

[14 of 36 positions shown; findings below may reference images not displayed]

FINDINGS: Cardiovascular: Atherosclerotic calcification of the aorta. Heart is
enlarged. No pericardial effusion.

Mediastinum/Nodes: Mediastinal lymph nodes are not enlarged by CT
size criteria. Hilar regions are difficult to evaluate without IV
contrast. No axillary adenopathy. Esophagus is unremarkable.

Lungs/Pleura: Multiple bilateral pulmonary nodules, most of which
are 3 mm or less in size. Largest is seen in the medial apical
segment right upper lobe, measuring 10 mm (3/44). Mosaic pulmonary
parenchymal attenuation may be due to expiratory phase imaging.
Image quality is somewhat degraded by respiratory motion. No pleural
fluid. Airway is unremarkable.

Upper Abdomen: Visualized portion of the liver is unremarkable.
Large stone in the gallbladder. Visualized portions of the adrenal
glands, kidneys, spleen, pancreas, stomach and bowel are grossly
unremarkable.

Musculoskeletal: Degenerative changes in the spine. Old right rib
fractures. No worrisome lytic or sclerotic lesions.
IMPRESSION: 1. Multiple bilateral pulmonary nodules measure up to 10 mm in the
right upper lobe. In the absence of prior examinations for
comparison, Non-contrast chest CT at 3-6 months is recommended. If
the nodules are stable at time of repeat CT, then future CT at 18-24
months (from today's scan) is considered optional for low-risk
patients, but is recommended for high-risk patients. This
recommendation follows the consensus statement: Guidelines for
Management of Incidental Pulmonary Nodules Detected on CT Images:
2. Cholelithiasis.
3.  Aortic atherosclerosis ([0K]-[0K]).

## 2021-08-20 ENCOUNTER — Encounter: Payer: Medicare PPO | Admitting: Speech Pathology

## 2021-08-20 ENCOUNTER — Other Ambulatory Visit: Payer: Self-pay

## 2021-08-20 ENCOUNTER — Ambulatory Visit: Payer: Medicare PPO

## 2021-08-20 ENCOUNTER — Encounter: Payer: Self-pay | Admitting: Family Medicine

## 2021-08-20 ENCOUNTER — Telehealth (INDEPENDENT_AMBULATORY_CARE_PROVIDER_SITE_OTHER): Payer: Medicare PPO | Admitting: Family Medicine

## 2021-08-20 DIAGNOSIS — J209 Acute bronchitis, unspecified: Secondary | ICD-10-CM | POA: Diagnosis not present

## 2021-08-20 MED ORDER — PREDNISONE 10 MG PO TABS: ORAL_TABLET | ORAL | 0 refills | Status: DC

## 2021-08-20 NOTE — Progress Notes (Signed)
Virtual Visit via Video Note  I connected with [name] on 08/20/21 at 11:00 AM EST by a video enabled telemedicine application and verified that I am speaking with the correct person using two identifiers.  Location: Patient: home Provider: office    I discussed the limitations of evaluation and management by telemedicine and the availability of in person appointments. The patient expressed understanding and agreed to proceed.  Parties involved in encounter  Patient: Deborah Carey Spouse: [name]  Provider:  Loura Pardon MD   History of Present Illness: Pt presents with cough and nasal congestion   5 days ago- started with coughing and ST  Ears do not hurt  Feels like bronchitis  Some tightness and wheeze Not sob  Some phlegm - no color  Nose is stuffy and runny  A little headache here and there -sinus   No fever or chills or aches   No n/v/d   She was exp to some with sinus infection   Negative covid test   Otc : guaifenesin, delsym  Some tylenol     Observations/Objective:   Assessment and Plan: Problem List Items Addressed This Visit       Respiratory   Acute bronchitis    5 d into uri/no fever and neg covid testing  Suspect viral With chest tightness Teryl Lucy px prednisone 30 mg taper with discussion of side eff Albuterol mdi Q 4 h (she has and knows how to use) Discussed symptom control / delsym and expectorant  Fluids/rest Update if not starting to improve in a week or if worsening  ER precautions discussed        Follow Up Instructions: Drink fluids and rest  Guaifenesin is good for cough and congestion , delsym for cough Nasal saline for congestion as needed  Tylenol for fever or pain or headache  Please alert [place] if symptoms worsen (if severe or short of breath please go to the ER)  Take the prednisone as directed for congestion and wheezing   I discussed the assessment and treatment plan with the patient. The patient was provided an  opportunity to ask questions and all were answered. The patient agreed with the plan and demonstrated an understanding of the instructions.   The patient was advised to call back or seek an in-person evaluation if the symptoms worsen or if the condition fails to improve as anticipated.     Loura Pardon, MD

## 2021-08-20 NOTE — Assessment & Plan Note (Signed)
5 d into uri/no fever and neg covid testing  Suspect viral With chest tightness /wheeze px prednisone 30 mg taper with discussion of side eff Albuterol mdi Q 4 h (she has and knows how to use) Discussed symptom control / delsym and expectorant  Fluids/rest Update if not starting to improve in a week or if worsening  ER precautions discussed

## 2021-08-20 NOTE — Patient Instructions (Signed)
Drink fluids and rest  Guaifenesin is good for cough and congestion , delsym for cough Nasal saline for congestion as needed  Tylenol for fever or pain or headache  Please alert [place] if symptoms worsen (if severe or short of breath please go to the ER)  Take the prednisone as directed for congestion and wheezing

## 2021-08-24 ENCOUNTER — Ambulatory Visit: Payer: Medicare PPO | Admitting: Pulmonary Disease

## 2021-08-25 ENCOUNTER — Ambulatory Visit: Payer: Medicare PPO

## 2021-08-25 ENCOUNTER — Other Ambulatory Visit: Payer: Medicare PPO

## 2021-08-25 ENCOUNTER — Encounter: Payer: Medicare PPO | Admitting: Speech Pathology

## 2021-08-27 ENCOUNTER — Encounter: Payer: Medicare PPO | Admitting: Speech Pathology

## 2021-08-27 ENCOUNTER — Ambulatory Visit: Payer: Medicare PPO

## 2021-09-01 ENCOUNTER — Ambulatory Visit: Payer: Medicare PPO

## 2021-09-01 ENCOUNTER — Encounter: Payer: Medicare PPO | Admitting: Speech Pathology

## 2021-09-01 ENCOUNTER — Other Ambulatory Visit: Payer: Self-pay

## 2021-09-01 DIAGNOSIS — R2689 Other abnormalities of gait and mobility: Secondary | ICD-10-CM

## 2021-09-01 DIAGNOSIS — M6281 Muscle weakness (generalized): Secondary | ICD-10-CM

## 2021-09-01 DIAGNOSIS — R2681 Unsteadiness on feet: Secondary | ICD-10-CM

## 2021-09-01 DIAGNOSIS — R278 Other lack of coordination: Secondary | ICD-10-CM

## 2021-09-01 DIAGNOSIS — R262 Difficulty in walking, not elsewhere classified: Secondary | ICD-10-CM | POA: Diagnosis not present

## 2021-09-01 NOTE — Therapy (Signed)
[place] MAIN [facility] SERVICES [address] [place], [place], [address] Phone: [phone]   Fax:  [phone]  Physical Therapy Treatment  Patient Details  Name: Deborah Carey MRN: [id number] Date of Birth: 1950/11/25 No data recorded  Encounter Date: 09/01/2021   PT End of Session - 09/01/21 1024     Visit Number 15    Number of Visits 35    Date for PT Re-Evaluation 10/18/21    Authorization Type [employer]    Authorization Time Period 06/17/21-07/28/21: 07/26/21-10/18/21    Progress Note Due on Visit 20    PT Start Time 1016    PT Stop Time 1100    PT Time Calculation (min) 44 min    Equipment Utilized During Treatment Gait belt    Activity Tolerance Patient tolerated treatment well;No increased pain    Behavior During Therapy WFL for tasks assessed/performed             Past Medical History:  Diagnosis Date   Allergy    allergic rhinitis   Anxiety    Cataract    removed both eyes    Infertility, female    Osteoarthritis of both knees    OA    PONV (postoperative nausea and vomiting)    Post-menopausal bleeding    neg endo bx.    Past Surgical History:  Procedure Laterality Date   cataract surgery  2011   CESAREAN SECTION     times 2   COLONOSCOPY  2010   hems    ENDOMETRIAL BIOPSY     neg for CA cells   FACIAL COSMETIC SURGERY  2006   face lift   FOOT SURGERY  1998   rt heel spur removal   IR CT HEAD LTD  05/25/2021   IR PERCUTANEOUS ART THROMBECTOMY/INFUSION INTRACRANIAL INC DIAG ANGIO  05/25/2021   IR US GUIDE VASC ACCESS RIGHT  05/25/2021   RADIOLOGY WITH ANESTHESIA N/A 05/25/2021   Procedure: IR WITH ANESTHESIA;  Surgeon: Aletta Edouard, MD;  Location: [facility];  Service: Radiology;  Laterality: N/A;   REFRACTIVE SURGERY  08/1999    There were no vitals filed for this visit.   Subjective Assessment - 09/01/21 1014     Subjective Pt reports 3/10 R foot pain currently she reports this has been hurting  off and on. Pt has been out due to bronchitis. Pt reports no stumbles or falls. Pt states, "everything has gotten easier," since starting PT.    Patient is accompained by: [name]   Pertinent History 70 yo Female s/p CVA on 05/25/21; She was admitted to hospital for approximately 1 week and then discharged to inpatient rehab for 1 week. She was discharged home in October 2022. She reports since being home she has been doing well. She presents to therapy without assistive device. She denies using any assistive device at home. Denies any recent falls. She denies any stumbles or unsteadiness. She reports intermittent fatigue requiring rest at times. Pt reports she is not cooking at this time. She also reports she is not cleaning much. She reports trying some activities but fatigues quickly. She reports she does sleep well at night.She denies any numbness/tingling;    Limitations Standing;Walking    How long can you sit comfortably? NA    How long can you stand comfortably? 10-15 min;    How long can you walk comfortably? approximately 10 min, >500 feet; Does fatigue with long distance walking  Diagnostic tests MRI on 05/26/21: Acute infarction of the left putamen and caudate. Minimal petechial  blood products but no frank hematoma. Mild swelling but no midline  shift.     Three punctate acute infarctions within the medial right frontal  lobe, probably micro embolic infarctions in the anterior cerebral  artery territory.     Old infarction in the right parietal cortical and subcortical brain  and seen affecting the cerebral hemispheric deep white matter  extensively.    Patient Stated Goals improve energy; be able to walk better- faster and longer;    Currently in Pain? No/denies    Pain Onset More than a month ago              INTERVENTION:   Therex:   Nustep HIIT training: 8 min total (seat at 7). SPM maintained in 60s-70s. 2 min level 1 30 sec level 3 1 min level 1 30 sec level 3 1 min  level 1  30 sec level 4 1 min level 1 30 sec level 4 1 min level 1 Comments: Pt rates intervention overall as easy Exercise tolerance monitored throughout intervention. Total distance travelled: 0.30 miles   STS 2 x 8, 1x6 hands-free. RPE 5/10, no increased pain.   Ambulation 2x 160 ft  in hallway with 3# ankle weights with emphasis on foot clearance, LE endurance and heel to toe sequencing. CGA assist provided due to pt unsteadiness.   Neuromuscular Re-ed:   The following were completed in the // bars with CGA:   Forward/backward stepping over half foam x 12 steps each side. VC for RLE DF and foot clearance/ Pt continued to have difficulty stepping backward with the RLE. -progressed to 2# ankle weights donned on each LE with decreasing levels of UE support; pt still difficulty fully clearing half foam.   Seated ankle DF for motor control/to promote improved activation for foot clearance 20x alternating, 2-3 sec hold at end range  Penguin walks to promote DF activation for foot clearance 2x4 length of bars. Pt reports onset of fatigue .  Forward/backward stepping over hurdle - x multiple reps, pt still knocks into hurdle 2x, but shows improved performance overall with foot clearance  Ambulation of 1x 40 ft with horizontal head turns. Close CGA as pt with increased unsteadiness  Pt with no pain with interventions throughout.   Pt educated throughout session about proper posture and technique with exercises. Improved exercise technique, movement at target joints, use of target muscles after min to mod verbal, visual, tactile cues.   PT Short Term Goals - 07/26/21 1714       PT SHORT TERM GOAL #1   Title Patient will be adherent to HEP at least 3x a week to improve functional strength and balance for better safety at home.    Baseline 11/21: Pt reports completing her HEP 3x a week    Time 4    Period Weeks    Status Achieved    Target Date 07/14/21               PT Long  Term Goals - 07/26/21 1715       PT LONG TERM GOAL #1   Title Patient (> 16 years old) will complete five times sit to stand test in < 15 seconds indicating an increased LE strength and improved balance.    Baseline 11/21: 18.83 sec with arms crossed across chest    Time 12    Period Weeks  Status On-going    Target Date 10/18/21      PT LONG TERM GOAL #2   Title Patient will increase RLE gross strength to 4+/5 as to improve functional strength for independent gait, increased standing tolerance and increased ADL ability.    Baseline 11/21: 4/5 R dorsiflexion, everything else at least 4+/5    Time 12    Period Weeks    Status On-going    Target Date 10/18/21      PT LONG TERM GOAL #3   Title Patient will increase six minute walk test distance to >1300 for progression to community ambulator and improve gait ability closer to age group norms of 1644 feet;    Baseline 11/21: 1080 ft with CGA and no AD    Time 12    Period Weeks    Status On-going    Target Date 10/18/21      PT LONG TERM GOAL #4   Title Patient will ascend/descend 4 stairs without rail assist independently without loss of balance to improve ability to get in/out of home while carrying something.    Baseline 11/21: Pt requires 1 hand for descending stairs.    Time 12    Period Weeks    Status On-going    Target Date 10/18/21      PT LONG TERM GOAL #5   Title Patient will improve FOTO score by 5% to indicate improved functional mobility with ADLs.    Baseline 11/21: 61%    Time 12    Period Weeks    Status On-going    Target Date 10/18/21      PT LONG TERM GOAL #6   Title Patient will increase Functional Gait Assessment score to >22/30 as to reduce fall risk and improve dynamic gait safety with community ambulation.    Baseline 11/21: 20/30    Time 12    Period Weeks    Status On-going    Target Date 10/18/21                   Plan - 09/01/21 1425     Clinical Impression Statement Pt  returns after missing PT due to bronchitis. Although pt absent for a few sessions, pt has continued to make progress, as evidenced by ability to perform interventions with increased resistance and/or volume. Pt also able to progress to more complex dynamic balance tasks and perfrom foot clearance interventions with higher obstacles. While pt shows progress, she still is most challenged with RLE foot clearance interventions, showing insufficient DF throughout. Pt did show within-session improvement with foot clearance interventions following targeted DF exercise. The pt will benefit from further skilled PT to improve strength, endurance, gait and balance to increase ease and safety with ADLs.    Personal Factors and Comorbidities Age;Comorbidity 3+    Comorbidities history of chronic right foot pain, knee OA, hypercholesterolemia, abnormal heart rhythm (on blood thinners), osteopenia,    Examination-Activity Limitations Caring for Others;Squat;Stairs;Stand;Transfers    Examination-Participation Restrictions Cleaning;Community Activity;Shop;Volunteer    Stability/Clinical Decision Making Stable/Uncomplicated    Rehab Potential Good    PT Frequency 2x / week    PT Duration 12 weeks    PT Treatment/Interventions Cryotherapy;Moist Heat;Electrical Stimulation;Gait training;Stair training;Functional mobility training;Therapeutic activities;Therapeutic exercise;Balance training;Neuromuscular re-education;Patient/family education;Energy conservation    PT Next Visit Plan review HEP- high level balance training: assess Right foot for pain as appropriate. Floor to chair transfers., foot clearance interventions, endurance interventions    PT Home Exercise Plan 06/28/2021: Access  Code: [email or web address]; no updates    Consulted and Agree with Plan of Care Patient             Patient will benefit from skilled therapeutic intervention in order to improve the following deficits and impairments:  Abnormal gait, Decreased  balance, Decreased endurance, Decreased mobility, Difficulty walking, Cardiopulmonary status limiting activity, Decreased strength, Decreased safety awareness, Decreased activity tolerance  Visit Diagnosis: Muscle weakness (generalized)  Unsteadiness on feet  Other abnormalities of gait and mobility  Other lack of coordination     Problem List Patient Active Problem List   Diagnosis Date Noted   Chronic ischemic left MCA stroke 06/18/2021   Cholelithiasis without cholecystitis 06/07/2021   Nodule of apex of right lung 06/07/2021   History of CVA (cerebrovascular accident) 05/25/2021   Atrial fibrillation ([place])    Mass of breast, left 11/13/2020   Colon cancer screening 05/14/2019   Vitamin D deficiency 05/08/2018   Flushing 06/20/2017   H/O fracture of fibula 01/21/2014   Osteopenia 01/07/2014   Family history of osteoporosis 11/29/2013   Estrogen deficiency 11/29/2013   Hyperlipidemia 11/29/2013   Encounter for Medicare annual wellness exam 10/29/2013   Irregular heartbeat 10/07/2013   Left thyroid nodule 09/26/2013   Enlarged thyroid 09/16/2013   Acute bronchitis 09/07/2013   Primary osteoarthritis of both knees 11/05/2012   Other screening mammogram 09/27/2011   Routine general medical examination at a health care facility 09/19/2011   Class 1 obesity due to excess calories with serious comorbidity and body mass index (BMI) of 34.0 to 34.9 in adult 06/06/2008   Prediabetes 06/06/2008   Anxiety state 04/17/2007   ALLERGIC RHINITIS 04/17/2007    Zollie Pee, PT 09/01/2021, 2:28 PM  [place] MAIN [facility] SERVICES [address] [place], [place], [address] Phone: [phone]   Fax:  [phone]  Name: OVETTA BAZZANO MRN: [id number] Date of Birth: Jul 13, 1951

## 2021-09-03 ENCOUNTER — Other Ambulatory Visit: Payer: Self-pay

## 2021-09-03 ENCOUNTER — Encounter: Payer: Medicare PPO | Admitting: Speech Pathology

## 2021-09-03 ENCOUNTER — Ambulatory Visit: Payer: Medicare PPO

## 2021-09-03 DIAGNOSIS — R2689 Other abnormalities of gait and mobility: Secondary | ICD-10-CM | POA: Diagnosis not present

## 2021-09-03 DIAGNOSIS — R278 Other lack of coordination: Secondary | ICD-10-CM

## 2021-09-03 DIAGNOSIS — R2681 Unsteadiness on feet: Secondary | ICD-10-CM

## 2021-09-03 DIAGNOSIS — R262 Difficulty in walking, not elsewhere classified: Secondary | ICD-10-CM | POA: Diagnosis not present

## 2021-09-03 DIAGNOSIS — M6281 Muscle weakness (generalized): Secondary | ICD-10-CM | POA: Diagnosis not present

## 2021-09-03 NOTE — Therapy (Signed)
[place] MAIN [facility] SERVICES [address] [place], [place], [address] Phone: [phone]   Fax:  [phone]  Physical Therapy Treatment  Patient Details  Name: Deborah Carey MRN: [id number] Date of Birth: 06-05-1951 No data recorded  Encounter Date: 09/03/2021   PT End of Session - 09/03/21 1029     Visit Number 16    Number of Visits 35    Date for PT Re-Evaluation 10/18/21    Authorization Type [employer]    Authorization Time Period 06/17/21-07/28/21: 07/26/21-10/18/21    Progress Note Due on Visit 20    PT Start Time 0933    PT Stop Time 1016    PT Time Calculation (min) 43 min    Equipment Utilized During Treatment Gait belt    Activity Tolerance Patient tolerated treatment well;No increased pain    Behavior During Therapy WFL for tasks assessed/performed             Past Medical History:  Diagnosis Date   Allergy    allergic rhinitis   Anxiety    Cataract    removed both eyes    Infertility, female    Osteoarthritis of both knees    OA    PONV (postoperative nausea and vomiting)    Post-menopausal bleeding    neg endo bx.    Past Surgical History:  Procedure Laterality Date   cataract surgery  2011   CESAREAN SECTION     times 2   COLONOSCOPY  2010   hems    ENDOMETRIAL BIOPSY     neg for CA cells   FACIAL COSMETIC SURGERY  2006   face lift   FOOT SURGERY  1998   rt heel spur removal   IR CT HEAD LTD  05/25/2021   IR PERCUTANEOUS ART THROMBECTOMY/INFUSION INTRACRANIAL INC DIAG ANGIO  05/25/2021   IR US GUIDE VASC ACCESS RIGHT  05/25/2021   RADIOLOGY WITH ANESTHESIA N/A 05/25/2021   Procedure: IR WITH ANESTHESIA;  Surgeon: Aletta Edouard, MD;  Location: [place];  Service: Radiology;  Laterality: N/A;   REFRACTIVE SURGERY  08/1999    There were no vitals filed for this visit.   Subjective Assessment - 09/03/21 0932     Subjective Pt reports she is doing OK. Pt reports she was sore in LEs following  last appointment. Pt reports no stumbles/LOB.    Patient is accompained by: Deborah Carey   Pertinent History 70 yo Female s/p CVA on 05/25/21; She was admitted to hospital for approximately 1 week and then discharged to inpatient rehab for 1 week. She was discharged home in October 2022. She reports since being home she has been doing well. She presents to therapy without assistive device. She denies using any assistive device at home. Denies any recent falls. She denies any stumbles or unsteadiness. She reports intermittent fatigue requiring rest at times. Pt reports she is not cooking at this time. She also reports she is not cleaning much. She reports trying some activities but fatigues quickly. She reports she does sleep well at night.She denies any numbness/tingling;    Limitations Standing;Walking    How long can you sit comfortably? NA    How long can you stand comfortably? 10-15 min;    How long can you walk comfortably? approximately 10 min, >500 feet; Does fatigue with long distance walking    Diagnostic tests MRI on 05/26/21: Acute infarction of the left putamen and caudate. Minimal petechial  blood  products but no frank hematoma. Mild swelling but no midline  shift.     Three punctate acute infarctions within the medial right frontal  lobe, probably micro embolic infarctions in the anterior cerebral  artery territory.     Old infarction in the right parietal cortical and subcortical brain  and seen affecting the cerebral hemispheric deep white matter  extensively.    Patient Stated Goals improve energy; be able to walk better- faster and longer;    Pain Onset More than a month ago             INTERVENTION:   Therex:   Nustep HIIT training: SPM maintained in 60s-90s. 2 min level 1 30 sec level 3 1 min level 1 30 sec level 3 1 min level 1  30 sec level 4 1 min level 1 30 sec level 4 1 min level 1 Comments: Pt rates intervention overall as easy Exercise tolerance monitored  throughout intervention. Up to min a provided for mount/dismount/LE positioning. Pt does report fatigue with intervention.   STS 2 x 8 hands-free. Pt rates as medium. Pt reports some soreness in BLEs.  LAQ 2# weights on each LE 2x16 alternating LEs; pt rates as easy   Standing hip abduction with 2# weights on each LE 2x10 B; cuing for decreasing speed/avoiding using momentum   Neuromuscular Re-ed:   The following were completed in the // bars with CGA and gait belt donned unless otherwise specified:  Seated ankle DF with 2# weights for motor control/to promote improved activation for foot clearance 20x alternating, 2-3 sec hold at end range   Forward/backward stepping over half foam x 20 steps with pt wearing 2# ankle weights. Improved foot clearance today compared to prior session. Pt still knocks into half foam 6x. Greatest difficulty with backward step with R LE.  Foot clearance step ups onto 6" block with 2# weights on each LE 10x alternating lead LE. Pt able to perform without dragging toes over edge.   Cone taps (2 cones placed in front of patient) with decreasing levels of UE support on bar to no UE support -  x multiple reps B. Cuing to tap with heel for DF. Pt knocks over cones but improves with reps. Still requires at least intermittent UE support.  Dynamic gait/balance: Ambulation with horizontal head turns 3x10 meters Ambulation with vertical head turns 3x10 meters Ambulation with diagonal head turns L to Rt 2x10 meters Ambulation with diagonal head turns Rt to L 2x10 meters Ambulation with EC 2x10 meters Comments: Pt reported some dizziness with diagonal head turns. Pt uses mid guard and observed to decrease gait speed throughout. With EC pt tendency to drift toward R.    Pt educated throughout session about proper posture and technique with exercises. Improved exercise technique, movement at target joints, use of target muscles after min to mod verbal, visual, tactile  cues.     PT Short Term Goals - 07/26/21 1714       PT SHORT TERM GOAL #1   Title Patient will be adherent to HEP at least 3x a week to improve functional strength and balance for better safety at home.    Baseline 11/21: Pt reports completing her HEP 3x a week    Time 4    Period Weeks    Status Achieved    Target Date 07/14/21               PT Long Term Goals - 07/26/21 1715  PT LONG TERM GOAL #1   Title Patient (> 38 years old) will complete five times sit to stand test in < 15 seconds indicating an increased LE strength and improved balance.    Baseline 11/21: 18.83 sec with arms crossed across chest    Time 12    Period Weeks    Status On-going    Target Date 10/18/21      PT LONG TERM GOAL #2   Title Patient will increase RLE gross strength to 4+/5 as to improve functional strength for independent gait, increased standing tolerance and increased ADL ability.    Baseline 11/21: 4/5 R dorsiflexion, everything else at least 4+/5    Time 12    Period Weeks    Status On-going    Target Date 10/18/21      PT LONG TERM GOAL #3   Title Patient will increase six minute walk test distance to >1300 for progression to community ambulator and improve gait ability closer to age group norms of 1644 feet;    Baseline 11/21: 1080 ft with CGA and no AD    Time 12    Period Weeks    Status On-going    Target Date 10/18/21      PT LONG TERM GOAL #4   Title Patient will ascend/descend 4 stairs without rail assist independently without loss of balance to improve ability to get in/out of home while carrying something.    Baseline 11/21: Pt requires 1 hand for descending stairs.    Time 12    Period Weeks    Status On-going    Target Date 10/18/21      PT LONG TERM GOAL #5   Title Patient will improve FOTO score by 5% to indicate improved functional mobility with ADLs.    Baseline 11/21: 61%    Time 12    Period Weeks    Status On-going    Target Date 10/18/21       PT LONG TERM GOAL #6   Title Patient will increase Functional Gait Assessment score to >22/30 as to reduce fall risk and improve dynamic gait safety with community ambulation.    Baseline 11/21: 20/30    Time 12    Period Weeks    Status On-going    Target Date 10/18/21                   Plan - 09/03/21 1029     Clinical Impression Statement Pt presents with excellent motivation to participate in session. Pt shows progress with foot clearance by performing intervention with ankle weights and more consistently clearing foot over obstacles. While pt shows progress, she still is challenged with clearing R LE with a backwards step over an obstacle. The pt will continue to benefit from further skilled PT to continue to improve strength, endurance, gait and balance to increase ease and safety wtih ADLs.    Personal Factors and Comorbidities Age;Comorbidity 3+    Comorbidities history of chronic right foot pain, knee OA, hypercholesterolemia, abnormal heart rhythm (on blood thinners), osteopenia,    Examination-Activity Limitations Caring for Others;Squat;Stairs;Stand;Transfers    Examination-Participation Restrictions Cleaning;Community Activity;Shop;Volunteer    Stability/Clinical Decision Making Stable/Uncomplicated    Rehab Potential Good    PT Frequency 2x / week    PT Duration 12 weeks    PT Treatment/Interventions Cryotherapy;Moist Heat;Electrical Stimulation;Gait training;Stair training;Functional mobility training;Therapeutic activities;Therapeutic exercise;Balance training;Neuromuscular re-education;Patient/family education;Energy conservation    PT Next Visit Plan review HEP- high level balance  training: assess Right foot for pain as appropriate. Floor to chair transfers., foot clearance interventions, endurance interventions, continue POC as indicated    PT Home Exercise Plan 06/28/2021: Access Code: [email or web address]; no updates    Consulted and Agree with Plan of Care Patient              Patient will benefit from skilled therapeutic intervention in order to improve the following deficits and impairments:  Abnormal gait, Decreased balance, Decreased endurance, Decreased mobility, Difficulty walking, Cardiopulmonary status limiting activity, Decreased strength, Decreased safety awareness, Decreased activity tolerance  Visit Diagnosis: Muscle weakness (generalized)  Unsteadiness on feet  Other abnormalities of gait and mobility  Other lack of coordination     Problem List Patient Active Problem List   Diagnosis Date Noted   Chronic ischemic left MCA stroke 06/18/2021   Cholelithiasis without cholecystitis 06/07/2021   Nodule of apex of right lung 06/07/2021   History of CVA (cerebrovascular accident) 05/25/2021   Atrial fibrillation ([place])    Mass of breast, left 11/13/2020   Colon cancer screening 05/14/2019   Vitamin D deficiency 05/08/2018   Flushing 06/20/2017   H/O fracture of fibula 01/21/2014   Osteopenia 01/07/2014   Family history of osteoporosis 11/29/2013   Estrogen deficiency 11/29/2013   Hyperlipidemia 11/29/2013   Encounter for Medicare annual wellness exam 10/29/2013   Irregular heartbeat 10/07/2013   Left thyroid nodule 09/26/2013   Enlarged thyroid 09/16/2013   Acute bronchitis 09/07/2013   Primary osteoarthritis of both knees 11/05/2012   Other screening mammogram 09/27/2011   Routine general medical examination at a health care facility 09/19/2011   Class 1 obesity due to excess calories with serious comorbidity and body mass index (BMI) of 34.0 to 34.9 in adult 06/06/2008   Prediabetes 06/06/2008   Anxiety state 04/17/2007   ALLERGIC RHINITIS 04/17/2007    Zollie Pee, PT 09/03/2021, 10:32 AM  [place] MAIN [facility] SERVICES [address] [place], [place], [address] Phone: [phone]   Fax:  [phone]  Name: AMEY HOSSAIN MRN: [id number] Date of Birth: 1951-06-29

## 2021-09-07 ENCOUNTER — Other Ambulatory Visit: Payer: Self-pay

## 2021-09-07 ENCOUNTER — Ambulatory Visit: Payer: Medicare PPO | Attending: Physical Medicine and Rehabilitation

## 2021-09-07 ENCOUNTER — Other Ambulatory Visit (INDEPENDENT_AMBULATORY_CARE_PROVIDER_SITE_OTHER): Payer: Medicare PPO

## 2021-09-07 DIAGNOSIS — R262 Difficulty in walking, not elsewhere classified: Secondary | ICD-10-CM | POA: Diagnosis not present

## 2021-09-07 DIAGNOSIS — R269 Unspecified abnormalities of gait and mobility: Secondary | ICD-10-CM | POA: Diagnosis not present

## 2021-09-07 DIAGNOSIS — R2681 Unsteadiness on feet: Secondary | ICD-10-CM | POA: Insufficient documentation

## 2021-09-07 DIAGNOSIS — E7800 Pure hypercholesterolemia, unspecified: Secondary | ICD-10-CM

## 2021-09-07 DIAGNOSIS — R296 Repeated falls: Secondary | ICD-10-CM | POA: Diagnosis not present

## 2021-09-07 DIAGNOSIS — R2689 Other abnormalities of gait and mobility: Secondary | ICD-10-CM | POA: Insufficient documentation

## 2021-09-07 DIAGNOSIS — R278 Other lack of coordination: Secondary | ICD-10-CM | POA: Insufficient documentation

## 2021-09-07 DIAGNOSIS — M6281 Muscle weakness (generalized): Secondary | ICD-10-CM | POA: Diagnosis not present

## 2021-09-07 LAB — AST: AST: 16 U/L (ref 0–37)

## 2021-09-07 LAB — LIPID PANEL
Cholesterol: 122 mg/dL (ref 0–200)
HDL: 39.8 mg/dL (ref >39.00)
LDL Cholesterol: 62 mg/dL (ref 0–99)
NonHDL: 82.67
Total CHOL/HDL Ratio: 3
Triglycerides: 101 mg/dL (ref 0.0–149.0)
VLDL: 20.2 mg/dL (ref 0.0–40.0)

## 2021-09-07 LAB — ALT: ALT: 16 U/L (ref 0–35)

## 2021-09-07 NOTE — Therapy (Signed)
[place] MAIN [facility] SERVICES [address] [place], [place], [address] Phone: [phone]   Fax:  [phone]  Physical Therapy Treatment  Patient Details  Name: Deborah Carey MRN: [id number] Date of Birth: 1951-08-04 No data recorded  Encounter Date: 09/07/2021   PT End of Session - 09/07/21 1621     Visit Number 17    Number of Visits 35    Date for PT Re-Evaluation 10/18/21    Authorization Type [employer]    Authorization Time Period 06/17/21-07/28/21: 07/26/21-10/18/21    Progress Note Due on Visit 20    PT Start Time 1528    PT Stop Time 1612    PT Time Calculation (min) 44 min    Equipment Utilized During Treatment Gait belt    Activity Tolerance Patient tolerated treatment well    Behavior During Therapy WFL for tasks assessed/performed             Past Medical History:  Diagnosis Date   Allergy    allergic rhinitis   Anxiety    Cataract    removed both eyes    Infertility, female    Osteoarthritis of both knees    OA    PONV (postoperative nausea and vomiting)    Post-menopausal bleeding    neg endo bx.    Past Surgical History:  Procedure Laterality Date   cataract surgery  2011   CESAREAN SECTION     times 2   COLONOSCOPY  2010   hems    ENDOMETRIAL BIOPSY     neg for CA cells   FACIAL COSMETIC SURGERY  2006   face lift   FOOT SURGERY  1998   rt heel spur removal   IR CT HEAD LTD  05/25/2021   IR PERCUTANEOUS ART THROMBECTOMY/INFUSION INTRACRANIAL INC DIAG ANGIO  05/25/2021   IR US GUIDE VASC ACCESS RIGHT  05/25/2021   RADIOLOGY WITH ANESTHESIA N/A 05/25/2021   Procedure: IR WITH ANESTHESIA;  Surgeon: Aletta Edouard, MD;  Location: [facility];  Service: Radiology;  Laterality: N/A;   REFRACTIVE SURGERY  08/1999    There were no vitals filed for this visit.   Subjective Assessment - 09/07/21 1533     Subjective Pt reports no falls/LOB. Pt reports no pain currently as she took Tylenol prior to session.     Patient is accompained by: Deborah Carey   Pertinent History 71 yo Female s/p CVA on 05/25/21; She was admitted to hospital for approximately 1 week and then discharged to inpatient rehab for 1 week. She was discharged home in October 2022. She reports since being home she has been doing well. She presents to therapy without assistive device. She denies using any assistive device at home. Denies any recent falls. She denies any stumbles or unsteadiness. She reports intermittent fatigue requiring rest at times. Pt reports she is not cooking at this time. She also reports she is not cleaning much. She reports trying some activities but fatigues quickly. She reports she does sleep well at night.She denies any numbness/tingling;    Limitations Standing;Walking    How long can you sit comfortably? NA    How long can you stand comfortably? 10-15 min;    How long can you walk comfortably? approximately 10 min, >500 feet; Does fatigue with long distance walking    Diagnostic tests MRI on 05/26/21: Acute infarction of the left putamen and caudate. Minimal petechial  blood products but no frank hematoma. Mild  swelling but no midline  shift.     Three punctate acute infarctions within the medial right frontal  lobe, probably micro embolic infarctions in the anterior cerebral  artery territory.     Old infarction in the right parietal cortical and subcortical brain  and seen affecting the cerebral hemispheric deep white matter  extensively.    Patient Stated Goals improve energy; be able to walk better- faster and longer;    Currently in Pain? No/denies    Pain Onset More than a month ago           INTERVENTION:   Therex:   Nustep HIIT training: SPM maintained in 60s-70s. 2 min level 1 30 sec level 3 1 min level 1 30 sec level 4 1 min level 1  30 sec level 4 1 min level 2 30 sec level 4 1 min level 2 Comments: Pt rates level 2 as easy and level 4 as hard Exercise tolerance monitored throughout  intervention. Up to min a provided for mount/dismount/LE positioning.   STS 1x8. Pt pushing primarily through LLE.  STS with pt holding 1000 gr ball 1x4. Discontinued due to increased knee pain.   LAQ no weight 20x alternating  LAQ 2# weights on each LE 2x16 alternating LEs; pt rates as easy   Stepping up and down 6" stair step with 2# weights on each LE x multiple reps alternating lead LE    Standing hip abduction with 2# weights on each LE 2x10 B; pt reports she feels her ability to perform intervention has improved    Neuromuscular Re-ed:   The following were completed at support surface with CGA and gait belt donned unless otherwise specified:    Cone taps (2 cones placed in front of patient) with decreasing levels of UE support on bar to no UE support -  x multiple reps B. Improvement from previous session. Only knocks over cones 2x, able to perform multiple reps without UE support. Pt rates as hard.   SLB 2x30 sec each LE; intermittent UE support on bar. Pt reports easier time standing on LLE compared to RLE.     Dynamic gait/balance: Ambulation with diagonal head turns L to Rt 2x10 meters Ambulation with diagonal head turns Rt to L 2x10 meters Ambulation with EC 2x10 meters Comments: No dizziness reported. Pt requires use of a step-strategy 1x due to unsteadiness and exhibits increased variability in BOS with diagonal head turns.      Pt educated throughout session about proper posture and technique with exercises. Improved exercise technique, movement at target joints, use of target muscles after min to mod verbal, visual, tactile cues.          PT Education - 09/07/21 1621     Education Details exercise technique, body mechanics    Person(s) Educated Patient    Methods Demonstration;Explanation;Tactile cues;Verbal cues    Comprehension Verbal cues required;Returned demonstration;Verbalized understanding;Need further instruction              PT Short Term  Goals - 07/26/21 1714       PT SHORT TERM GOAL #1   Title Patient will be adherent to HEP at least 3x a week to improve functional strength and balance for better safety at home.    Baseline 11/21: Pt reports completing her HEP 3x a week    Time 4    Period Weeks    Status Achieved    Target Date 07/14/21  PT Long Term Goals - 07/26/21 1715       PT LONG TERM GOAL #1   Title Patient (> 43 years old) will complete five times sit to stand test in < 15 seconds indicating an increased LE strength and improved balance.    Baseline 11/21: 18.83 sec with arms crossed across chest    Time 12    Period Weeks    Status On-going    Target Date 10/18/21      PT LONG TERM GOAL #2   Title Patient will increase RLE gross strength to 4+/5 as to improve functional strength for independent gait, increased standing tolerance and increased ADL ability.    Baseline 11/21: 4/5 R dorsiflexion, everything else at least 4+/5    Time 12    Period Weeks    Status On-going    Target Date 10/18/21      PT LONG TERM GOAL #3   Title Patient will increase six minute walk test distance to >1300 for progression to community ambulator and improve gait ability closer to age group norms of 1644 feet;    Baseline 11/21: 1080 ft with CGA and no AD    Time 12    Period Weeks    Status On-going    Target Date 10/18/21      PT LONG TERM GOAL #4   Title Patient will ascend/descend 4 stairs without rail assist independently without loss of balance to improve ability to get in/out of home while carrying something.    Baseline 11/21: Pt requires 1 hand for descending stairs.    Time 12    Period Weeks    Status On-going    Target Date 10/18/21      PT LONG TERM GOAL #5   Title Patient will improve FOTO score by 5% to indicate improved functional mobility with ADLs.    Baseline 11/21: 61%    Time 12    Period Weeks    Status On-going    Target Date 10/18/21      PT LONG TERM GOAL #6    Title Patient will increase Functional Gait Assessment score to >22/30 as to reduce fall risk and improve dynamic gait safety with community ambulation.    Baseline 11/21: 20/30    Time 12    Period Weeks    Status On-going    Target Date 10/18/21                   Plan - 09/07/21 1622     Clinical Impression Statement Pt able to progress level of resistance used with nustep and STS, indicating improved BLE strength, endurance, and increased activity tolerance. While pt shows progress, STS were discontinued early due to reports of increased knee pain. Pt had no pain with all other interventions. Pt with improved ability to perform cone tap exercise as well so she was advanced to SLB. The pt required toe-touch and intermittent UE support to maintain SLB. The pt will benefit from further skilled PT to improve strength, endurance, gait nad balance to increase ease and safety with ADLs.    Personal Factors and Comorbidities Age;Comorbidity 3+    Comorbidities history of chronic right foot pain, knee OA, hypercholesterolemia, abnormal heart rhythm (on blood thinners), osteopenia,    Examination-Activity Limitations Caring for Others;Squat;Stairs;Stand;Transfers    Examination-Participation Restrictions Cleaning;Community Activity;Shop;Volunteer    Stability/Clinical Decision Making Stable/Uncomplicated    Rehab Potential Good    PT Frequency 2x / week  PT Duration 12 weeks    PT Treatment/Interventions Cryotherapy;Moist Heat;Electrical Stimulation;Gait training;Stair training;Functional mobility training;Therapeutic activities;Therapeutic exercise;Balance training;Neuromuscular re-education;Patient/family education;Energy conservation    PT Next Visit Plan review HEP- high level balance training: assess Right foot for pain as appropriate. Floor to chair transfers., foot clearance interventions, endurance interventions, continue POC as indicated    PT Home Exercise Plan 06/28/2021: Access  Code: [email or web address]; no updates    Consulted and Agree with Plan of Care Patient             Patient will benefit from skilled therapeutic intervention in order to improve the following deficits and impairments:  Abnormal gait, Decreased balance, Decreased endurance, Decreased mobility, Difficulty walking, Cardiopulmonary status limiting activity, Decreased strength, Decreased safety awareness, Decreased activity tolerance  Visit Diagnosis: Muscle weakness (generalized)  Unsteadiness on feet  Other abnormalities of gait and mobility     Problem List Patient Active Problem List   Diagnosis Date Noted   Chronic ischemic left MCA stroke 06/18/2021   Cholelithiasis without cholecystitis 06/07/2021   Nodule of apex of right lung 06/07/2021   History of CVA (cerebrovascular accident) 05/25/2021   Atrial fibrillation ([place])    Mass of breast, left 11/13/2020   Colon cancer screening 05/14/2019   Vitamin D deficiency 05/08/2018   Flushing 06/20/2017   H/O fracture of fibula 01/21/2014   Osteopenia 01/07/2014   Family history of osteoporosis 11/29/2013   Estrogen deficiency 11/29/2013   Hyperlipidemia 11/29/2013   Encounter for Medicare annual wellness exam 10/29/2013   Irregular heartbeat 10/07/2013   Left thyroid nodule 09/26/2013   Enlarged thyroid 09/16/2013   Acute bronchitis 09/07/2013   Primary osteoarthritis of both knees 11/05/2012   Other screening mammogram 09/27/2011   Routine general medical examination at a health care facility 09/19/2011   Class 1 obesity due to excess calories with serious comorbidity and body mass index (BMI) of 34.0 to 34.9 in adult 06/06/2008   Prediabetes 06/06/2008   Anxiety state 04/17/2007   ALLERGIC RHINITIS 04/17/2007    Zollie Pee, PT 09/07/2021, 4:25 PM  [facility] [address] [place], [place], [address] Phone: [phone]   Fax:  [phone]  Name: SHAVELLE RUNKEL MRN:  [id number] Date of Birth: Feb 03, 1951

## 2021-09-08 ENCOUNTER — Encounter: Payer: Self-pay | Admitting: *Deleted

## 2021-09-09 ENCOUNTER — Other Ambulatory Visit: Payer: Self-pay

## 2021-09-09 ENCOUNTER — Encounter: Payer: Self-pay | Admitting: Physical Therapy

## 2021-09-09 ENCOUNTER — Ambulatory Visit: Payer: Medicare PPO | Admitting: Physical Therapy

## 2021-09-09 DIAGNOSIS — R2681 Unsteadiness on feet: Secondary | ICD-10-CM | POA: Diagnosis not present

## 2021-09-09 DIAGNOSIS — R2689 Other abnormalities of gait and mobility: Secondary | ICD-10-CM | POA: Diagnosis not present

## 2021-09-09 DIAGNOSIS — R269 Unspecified abnormalities of gait and mobility: Secondary | ICD-10-CM

## 2021-09-09 DIAGNOSIS — R296 Repeated falls: Secondary | ICD-10-CM | POA: Diagnosis not present

## 2021-09-09 DIAGNOSIS — R278 Other lack of coordination: Secondary | ICD-10-CM

## 2021-09-09 DIAGNOSIS — M6281 Muscle weakness (generalized): Secondary | ICD-10-CM | POA: Diagnosis not present

## 2021-09-09 DIAGNOSIS — R262 Difficulty in walking, not elsewhere classified: Secondary | ICD-10-CM

## 2021-09-09 NOTE — Therapy (Signed)
[place] MAIN [facility] SERVICES [address] [place], [place], [address] Phone: [phone]   Fax:  [phone]  Physical Therapy Treatment  Patient Details  Name: Deborah Carey MRN: [id number] Date of Birth: Jan 22, 1951 No data recorded  Encounter Date: 09/09/2021   PT End of Session - 09/09/21 0808     Visit Number 18    Number of Visits 35    Date for PT Re-Evaluation 10/18/21    Authorization Type [employer]    Authorization Time Period 06/17/21-07/28/21: 07/26/21-10/18/21    Progress Note Due on Visit 20    PT Start Time 0805    PT Stop Time 0845    PT Time Calculation (min) 40 min    Equipment Utilized During Treatment Gait belt    Activity Tolerance Patient tolerated treatment well    Behavior During Therapy WFL for tasks assessed/performed             Past Medical History:  Diagnosis Date   Allergy    allergic rhinitis   Anxiety    Cataract    removed both eyes    Infertility, female    Osteoarthritis of both knees    OA    PONV (postoperative nausea and vomiting)    Post-menopausal bleeding    neg endo bx.    Past Surgical History:  Procedure Laterality Date   cataract surgery  2011   CESAREAN SECTION     times 2   COLONOSCOPY  2010   hems    ENDOMETRIAL BIOPSY     neg for CA cells   FACIAL COSMETIC SURGERY  2006   face lift   FOOT SURGERY  1998   rt heel spur removal   IR CT HEAD LTD  05/25/2021   IR PERCUTANEOUS ART THROMBECTOMY/INFUSION INTRACRANIAL INC DIAG ANGIO  05/25/2021   IR US GUIDE VASC ACCESS RIGHT  05/25/2021   RADIOLOGY WITH ANESTHESIA N/A 05/25/2021   Procedure: IR WITH ANESTHESIA;  Surgeon: Aletta Edouard, MD;  Location: [place];  Service: Radiology;  Laterality: N/A;   REFRACTIVE SURGERY  08/1999    There were no vitals filed for this visit.   Subjective Assessment - 09/09/21 0807     Subjective Pt reports no falls/LOB.Pt reports some right knee pain which is likely after walking at  the mall; She may have overdone it.    Patient is accompained by: [name]   Pertinent History 71 yo Female s/p CVA on 05/25/21; She was admitted to hospital for approximately 1 week and then discharged to inpatient rehab for 1 week. She was discharged home in October 2022. She reports since being home she has been doing well. She presents to therapy without assistive device. She denies using any assistive device at home. Denies any recent falls. She denies any stumbles or unsteadiness. She reports intermittent fatigue requiring rest at times. Pt reports she is not cooking at this time. She also reports she is not cleaning much. She reports trying some activities but fatigues quickly. She reports she does sleep well at night.She denies any numbness/tingling;    Limitations Standing;Walking    How long can you sit comfortably? NA    How long can you stand comfortably? 10-15 min;    How long can you walk comfortably? approximately 10 min, >500 feet; Does fatigue with long distance walking    Diagnostic tests MRI on 05/26/21: Acute infarction of the left putamen and caudate. Minimal petechial  blood  products but no frank hematoma. Mild swelling but no midline  shift.     Three punctate acute infarctions within the medial right frontal  lobe, probably micro embolic infarctions in the anterior cerebral  artery territory.     Old infarction in the right parietal cortical and subcortical brain  and seen affecting the cerebral hemispheric deep white matter  extensively.    Patient Stated Goals improve energy; be able to walk better- faster and longer;    Currently in Pain? Yes    Pain Score 1     Pain Location Knee    Pain Orientation Right    Pain Descriptors / Indicators Aching    Pain Type Acute pain    Pain Onset Today    Pain Frequency Intermittent    Aggravating Factors  worse with prolonged standing/walking    Pain Relieving Factors rest    Effect of Pain on Daily Activities decreased activity  tolerance;    Multiple Pain Sites No                 TREATMENT:   Therex:   Leg press: BLE plate 40# 3I95 reps Attempted RLE only- but patient unable to complete minimum weight of 25#  Standing in parallel bars: Hip flexion against green tband tapping toe to 10 inch step 2x15 reps each LE Reports moderate difficulty, able to progress with LLE with reduced rail assist to 1 UE rail;  Pt able to negotiate steps #4 x2 sets with reciprocal gait pattern forward ascend/descend with intermittent rail assist; patient is slower with step negotiation;       Neuromuscular Re-ed:   The following were completed at support surface with CGA and gait belt donned unless otherwise specified:   Standing on airex pad: -unsupported heel/toe raises 3 sec hold x10 reps each with CGA for safety, patient has increased difficulty with toe lift; -standing on airex:  Alternate toe taps to 6 inch step unsupported x10 reps, CGA for safety  Progressed to toe taps to cone on 6 inch step #10 each LE, min A for safety, reports moderate difficulty;  Picking up cones from step unsupported # 3, no difficulty;   Stepping airex to airex forward/backward unsupported x5 reps, minimal difficulty Progressed to stepping airex to airex over orange hurdle to challenge step with having to increase foot clearance and take larger step x10 reps each unsupported, CGA for safety with moderate difficulty reported  Side stepping airex to airex over orange hurdle x10 reps each;  Standing on airex: Modified tandem stance unsupported head turns side/side x5 reps, close supervision, good safety noted, minimal instability;  Patient tolerated session well. She does report increased fatigue at end of session. She continues to have some weakness in RLE with decreased ROM and decreased motor control;       Pt educated throughout session about proper posture and technique with exercises. Improved exercise technique, movement at  target joints, use of target muscles after min to mod verbal, visual, tactile cues.                          PT Education - 09/09/21 0808     Education Details exercise technique/positioning;    Person(s) Educated Patient    Methods Explanation;Verbal cues    Comprehension Verbalized understanding;Returned demonstration;Verbal cues required;Need further instruction              PT Short Term Goals - 07/26/21 1714  PT SHORT TERM GOAL #1   Title Patient will be adherent to HEP at least 3x a week to improve functional strength and balance for better safety at home.    Baseline 11/21: Pt reports completing her HEP 3x a week    Time 4    Period Weeks    Status Achieved    Target Date 07/14/21               PT Long Term Goals - 07/26/21 1715       PT LONG TERM GOAL #1   Title Patient (> 85 years old) will complete five times sit to stand test in < 15 seconds indicating an increased LE strength and improved balance.    Baseline 11/21: 18.83 sec with arms crossed across chest    Time 12    Period Weeks    Status On-going    Target Date 10/18/21      PT LONG TERM GOAL #2   Title Patient will increase RLE gross strength to 4+/5 as to improve functional strength for independent gait, increased standing tolerance and increased ADL ability.    Baseline 11/21: 4/5 R dorsiflexion, everything else at least 4+/5    Time 12    Period Weeks    Status On-going    Target Date 10/18/21      PT LONG TERM GOAL #3   Title Patient will increase six minute walk test distance to >1300 for progression to community ambulator and improve gait ability closer to age group norms of 1644 feet;    Baseline 11/21: 1080 ft with CGA and no AD    Time 12    Period Weeks    Status On-going    Target Date 10/18/21      PT LONG TERM GOAL #4   Title Patient will ascend/descend 4 stairs without rail assist independently without loss of balance to improve ability to get  in/out of home while carrying something.    Baseline 11/21: Pt requires 1 hand for descending stairs.    Time 12    Period Weeks    Status On-going    Target Date 10/18/21      PT LONG TERM GOAL #5   Title Patient will improve FOTO score by 5% to indicate improved functional mobility with ADLs.    Baseline 11/21: 61%    Time 12    Period Weeks    Status On-going    Target Date 10/18/21      PT LONG TERM GOAL #6   Title Patient will increase Functional Gait Assessment score to >22/30 as to reduce fall risk and improve dynamic gait safety with community ambulation.    Baseline 11/21: 20/30    Time 12    Period Weeks    Status On-going    Target Date 10/18/21                   Plan - 09/09/21 7001     Clinical Impression Statement Patient motivated and participated well within session. She was instructed in advanced LE strengthening exercise. She continues to have weakness on RLE and intermittent right knee pain which limits exercise tolerance. She was unable to complete RLE single leg, leg press due to weakness this session. Patient instructed in advanced balance tasks. Progressed balance utilizing compliant surfaces to challenge stance control. Instructed patient in exercise with reduced rail assist to challenge stance. patient does report increased difficulty with dynamic balance tasks negotiating hurdle to  airex pad. She would benefit from additional skilled PT intervention to improve strength, balance and mobility;    Personal Factors and Comorbidities Age;Comorbidity 3+    Comorbidities history of chronic right foot pain, knee OA, hypercholesterolemia, abnormal heart rhythm (on blood thinners), osteopenia,    Examination-Activity Limitations Caring for Others;Squat;Stairs;Stand;Transfers    Examination-Participation Restrictions Cleaning;Community Activity;Shop;Volunteer    Stability/Clinical Decision Making Stable/Uncomplicated    Rehab Potential Good    PT Frequency 2x  / week    PT Duration 12 weeks    PT Treatment/Interventions Cryotherapy;Moist Heat;Electrical Stimulation;Gait training;Stair training;Functional mobility training;Therapeutic activities;Therapeutic exercise;Balance training;Neuromuscular re-education;Patient/family education;Energy conservation    PT Next Visit Plan review HEP- high level balance training: assess Right foot for pain as appropriate. Floor to chair transfers., foot clearance interventions, endurance interventions, continue POC as indicated    PT Home Exercise Plan 06/28/2021: Access Code: [email or web address]; no updates    Consulted and Agree with Plan of Care Patient             Patient will benefit from skilled therapeutic intervention in order to improve the following deficits and impairments:  Abnormal gait, Decreased balance, Decreased endurance, Decreased mobility, Difficulty walking, Cardiopulmonary status limiting activity, Decreased strength, Decreased safety awareness, Decreased activity tolerance  Visit Diagnosis: Muscle weakness (generalized)  Unsteadiness on feet  Other abnormalities of gait and mobility  Other lack of coordination  Difficulty in walking, not elsewhere classified  Abnormality of gait and mobility  Repeated falls     Problem List Patient Active Problem List   Diagnosis Date Noted   Chronic ischemic left MCA stroke 06/18/2021   Cholelithiasis without cholecystitis 06/07/2021   Nodule of apex of right lung 06/07/2021   History of CVA (cerebrovascular accident) 05/25/2021   Atrial fibrillation ([place])    Mass of breast, left 11/13/2020   Colon cancer screening 05/14/2019   Vitamin D deficiency 05/08/2018   Flushing 06/20/2017   H/O fracture of fibula 01/21/2014   Osteopenia 01/07/2014   Family history of osteoporosis 11/29/2013   Estrogen deficiency 11/29/2013   Hyperlipidemia 11/29/2013   Encounter for Medicare annual wellness exam 10/29/2013   Irregular heartbeat 10/07/2013   Left  thyroid nodule 09/26/2013   Enlarged thyroid 09/16/2013   Acute bronchitis 09/07/2013   Primary osteoarthritis of both knees 11/05/2012   Other screening mammogram 09/27/2011   Routine general medical examination at a health care facility 09/19/2011   Class 1 obesity due to excess calories with serious comorbidity and body mass index (BMI) of 34.0 to 34.9 in adult 06/06/2008   Prediabetes 06/06/2008   Anxiety state 04/17/2007   ALLERGIC RHINITIS 04/17/2007    Jasline Buskirk, PT, DPT 09/09/2021, 9:55 AM  [facility] [address] [place], [place], [address] Phone: [phone]   Fax:  [phone]  Name: Deborah Carey MRN: [id number] Date of Birth: Oct 21, 1950

## 2021-09-14 ENCOUNTER — Other Ambulatory Visit: Payer: Self-pay

## 2021-09-14 ENCOUNTER — Ambulatory Visit: Payer: Medicare PPO | Admitting: Physical Therapy

## 2021-09-14 ENCOUNTER — Encounter: Payer: Self-pay | Admitting: Physical Therapy

## 2021-09-14 DIAGNOSIS — R2689 Other abnormalities of gait and mobility: Secondary | ICD-10-CM | POA: Diagnosis not present

## 2021-09-14 DIAGNOSIS — R262 Difficulty in walking, not elsewhere classified: Secondary | ICD-10-CM | POA: Diagnosis not present

## 2021-09-14 DIAGNOSIS — M6281 Muscle weakness (generalized): Secondary | ICD-10-CM | POA: Diagnosis not present

## 2021-09-14 DIAGNOSIS — R278 Other lack of coordination: Secondary | ICD-10-CM

## 2021-09-14 DIAGNOSIS — R2681 Unsteadiness on feet: Secondary | ICD-10-CM | POA: Diagnosis not present

## 2021-09-14 DIAGNOSIS — R296 Repeated falls: Secondary | ICD-10-CM | POA: Diagnosis not present

## 2021-09-14 DIAGNOSIS — R269 Unspecified abnormalities of gait and mobility: Secondary | ICD-10-CM

## 2021-09-14 NOTE — Therapy (Signed)
[place] MAIN [facility] SERVICES [address] [place], [place], [address] Phone: [phone]   Fax:  [phone]  Physical Therapy Treatment/Discharge Summary  Patient Details  Name: REMINGTYN Carey MRN: [id number] Date of Birth: 17-Oct-1950 No data recorded  Encounter Date: 09/14/2021   PT End of Session - 09/14/21 1345     Visit Number 19    Number of Visits 35    Date for PT Deborah-Evaluation 10/18/21    Authorization Type [employer]    Authorization Time Period 06/17/21-07/28/21: 07/26/21-10/18/21    Progress Note Due on Visit 20    PT Start Time 1346    PT Stop Time 1430    PT Time Calculation (min) 44 min    Equipment Utilized During Treatment Gait belt    Activity Tolerance Patient tolerated treatment well    Behavior During Therapy WFL for tasks assessed/performed             Past Medical History:  Diagnosis Date   Allergy    allergic rhinitis   Anxiety    Cataract    removed both eyes    Infertility, female    Osteoarthritis of both knees    OA    PONV (postoperative nausea and vomiting)    Post-menopausal bleeding    neg endo bx.    Past Surgical History:  Procedure Laterality Date   cataract surgery  2011   CESAREAN SECTION     times 2   COLONOSCOPY  2010   hems    ENDOMETRIAL BIOPSY     neg for CA cells   FACIAL COSMETIC SURGERY  2006   face lift   FOOT SURGERY  1998   rt heel spur removal   IR CT HEAD LTD  05/25/2021   IR PERCUTANEOUS ART THROMBECTOMY/INFUSION INTRACRANIAL INC DIAG ANGIO  05/25/2021   IR US GUIDE VASC ACCESS RIGHT  05/25/2021   RADIOLOGY WITH ANESTHESIA N/A 05/25/2021   Procedure: IR WITH ANESTHESIA;  Surgeon: Aletta Edouard, MD;  Location: [place];  Service: Radiology;  Laterality: N/A;   REFRACTIVE SURGERY  08/1999    There were no vitals filed for this visit.   Subjective Assessment - 09/14/21 1354     Subjective Patient reports no pain currently. She reports her right knee was sore  most of this last week. She believes the hip flexion/resisted exercise seems to have aggravated it. "I don't want to do that exercise anymore."    Patient is accompained by: --   [name]   Pertinent History 71 yo Female s/p CVA on 05/25/21; She was admitted to hospital for approximately 1 week and then discharged to inpatient rehab for 1 week. She was discharged home in October 2022. She reports since being home she has been doing well. She presents to therapy without assistive device. She denies using any assistive device at home. Denies any recent falls. She denies any stumbles or unsteadiness. She reports intermittent fatigue requiring rest at times. Pt reports she is not cooking at this time. She also reports she is not cleaning much. She reports trying some activities but fatigues quickly. She reports she does sleep well at night.She denies any numbness/tingling;    Limitations Standing;Walking    How long can you sit comfortably? NA    How long can you stand comfortably? 10-15 min;    How long can you walk comfortably? approximately 10 min, >500 feet; Does fatigue with long distance walking    Diagnostic tests  MRI on 05/26/21: Acute infarction of the left putamen and caudate. Minimal petechial  blood products but no frank hematoma. Mild swelling but no midline  shift.     Three punctate acute infarctions within the medial right frontal  lobe, probably micro embolic infarctions in the anterior cerebral  artery territory.     Old infarction in the right parietal cortical and subcortical brain  and seen affecting the cerebral hemispheric deep white matter  extensively.    Patient Stated Goals improve energy; be able to walk better- faster and longer;    Currently in Pain? No/denies    Pain Onset Today                [facility] PT Assessment - 09/14/21 0001       6 minute walk test results    Aerobic Endurance Distance Walked 1395    Endurance additional comments without AD, supervision       Standardized Balance Assessment   Five times sit to stand comments  15.17 sec with arms across chest      High Level Balance   High Level Balance Comments able to hold SLS for 3-4 sec each LE consistently, independently;      Functional Gait  Assessment   Gait Level Surface Walks 20 ft in less than 7 sec but greater than 5.5 sec, uses assistive device, slower speed, mild gait deviations, or deviates 6-10 in outside of the 12 in walkway width.    Change in Gait Speed Able to smoothly change walking speed without loss of balance or gait deviation. Deviate no more than 6 in outside of the 12 in walkway width.    Gait with Horizontal Head Turns Performs head turns smoothly with no change in gait. Deviates no more than 6 in outside 12 in walkway width    Gait with Vertical Head Turns Performs head turns with no change in gait. Deviates no more than 6 in outside 12 in walkway width.    Gait and Pivot Turn Pivot turns safely within 3 sec and stops quickly with no loss of balance.    Step Over Obstacle Is able to step over one shoe box (4.5 in total height) without changing gait speed. No evidence of imbalance.    Gait with Narrow Base of Support Ambulates less than 4 steps heel to toe or cannot perform without assistance.    Gait with Eyes Closed Walks 20 ft, slow speed, abnormal gait pattern, evidence for imbalance, deviates 10-15 in outside 12 in walkway width. Requires more than 9 sec to ambulate 20 ft.    Ambulating Backwards Walks 20 ft, slow speed, abnormal gait pattern, evidence for imbalance, deviates 10-15 in outside 12 in walkway width.    Steps Alternating feet, must use rail.    Total Score 20    FGA comment: <22 indicates increased risk for falls, no change from 07/22/21                TREATMENT:  Warm up on Nustep BUE/BLE level 2 x3 min (Unbilled);  Patient instructed in outcome measures to address progress towards goals;  She is able to hold SLS for 3-4 sec each LE which is an  improvement;  Patient expressed desire to transition to HEP: PT adjusted HEP adding side stepping and forward/backward walking, see patient instructions;  Patient has met some goals and made progress towards all goals. Will discharge based on patient request with transition to HEP  PT Education - 09/14/21 1345     Education Details exercise technique/postioning;    Person(s) Educated Patient    Methods Explanation;Verbal cues    Comprehension Verbalized understanding;Returned demonstration;Verbal cues required;Need further instruction              PT Short Term Goals - 07/26/21 1714       PT SHORT TERM GOAL #1   Title Patient will be adherent to HEP at least 3x a week to improve functional strength and balance for better safety at home.    Baseline 11/21: Pt reports completing her HEP 3x a week    Time 4    Period Weeks    Status Achieved    Target Date 07/14/21               PT Long Term Goals - 09/14/21 1355       PT LONG TERM GOAL #1   Title Patient (> 101 years old) will complete five times sit to stand test in < 15 seconds indicating an increased LE strength and improved balance.    Baseline 11/21: 18.83 sec with arms crossed across chest; 1/10: 15.17 sec with arms across chest;    Time 12    Period Weeks    Status Partially Met    Target Date 10/18/21      PT LONG TERM GOAL #2   Title Patient will increase RLE gross strength to 4+/5 as to improve functional strength for independent gait, increased standing tolerance and increased ADL ability.    Baseline 11/21: 4/5 R dorsiflexion, everything else at least 4+/5, 1/10: right foot 4+/5    Time 12    Period Weeks    Status Partially Met    Target Date 10/18/21      PT LONG TERM GOAL #3   Title Patient will increase six minute walk test distance to >1300 for progression to community ambulator and improve gait ability closer to age group norms of 1644 feet;     Baseline 11/21: 1080 ft with CGA and no AD, 1/10: 1395    Time 12    Period Weeks    Status Achieved    Target Date 10/18/21      PT LONG TERM GOAL #4   Title Patient will ascend/descend 4 stairs without rail assist independently without loss of balance to improve ability to get in/out of home while carrying something.    Baseline 11/21: Pt requires 1 hand for descending stairs. 1/10: intermittent rail assist when descending;    Time 12    Period Weeks    Status Partially Met    Target Date 10/18/21      PT LONG TERM GOAL #5   Title Patient will improve FOTO score by 5% to indicate improved functional mobility with ADLs.    Baseline 11/21: 61%, 1/10:75%    Time 12    Period Weeks    Status Achieved    Target Date 10/18/21      PT LONG TERM GOAL #6   Title Patient will increase Functional Gait Assessment score to >22/30 as to reduce fall risk and improve dynamic gait safety with community ambulation.    Baseline 11/21: 20/30, 09/14/21: 20/30    Time 12    Period Weeks    Status On-going    Target Date 10/18/21                   Plan - 09/14/21 1455  Clinical Impression Statement Patient motivated and participated well within session. She expressed desire to be discharged from physical therapy. PT assesed patient's progress towards goals. She does exhibit improvement in sit<>Stand ability and gait speed. Patient reports functioning close to baseline. She is able to negotiate steps reciprocally with intermittent rail assist. She continues to have some instability and did test as an increased risk for falls on functional gait assessment. Her balance has improved in that she is able to hold SLS for 3-4 seconds. Patient expressed desire to stop therapy so that she can focus on her conditioning at home with a home exercise program. She is also thinking about joining a fitness center and going to silver sneakers classes. Patient expressed independence in HEP. Will discharge from  PT at this time;    Personal Factors and Comorbidities Age;Comorbidity 3+    Comorbidities history of chronic right foot pain, knee OA, hypercholesterolemia, abnormal heart rhythm (on blood thinners), osteopenia,    Examination-Activity Limitations Caring for Others;Squat;Stairs;Stand;Transfers    Examination-Participation Restrictions Cleaning;Community Activity;Shop;Volunteer    Stability/Clinical Decision Making Stable/Uncomplicated    Rehab Potential Good    PT Frequency 2x / week    PT Duration 12 weeks    PT Treatment/Interventions Cryotherapy;Moist Heat;Electrical Stimulation;Gait training;Stair training;Functional mobility training;Therapeutic activities;Therapeutic exercise;Balance training;Neuromuscular Deborah-education;Patient/family education;Energy conservation    PT Next Visit Plan review HEP- high level balance training: assess Right foot for pain as appropriate. Floor to chair transfers., foot clearance interventions, endurance interventions, continue POC as indicated    PT Home Exercise Plan 06/28/2021: Access Code: [email or web address]; no updates    Consulted and Agree with Plan of Care Patient             Patient will benefit from skilled therapeutic intervention in order to improve the following deficits and impairments:  Abnormal gait, Decreased balance, Decreased endurance, Decreased mobility, Difficulty walking, Cardiopulmonary status limiting activity, Decreased strength, Decreased safety awareness, Decreased activity tolerance  Visit Diagnosis: Muscle weakness (generalized)  Unsteadiness on feet  Other abnormalities of gait and mobility  Other lack of coordination  Difficulty in walking, not elsewhere classified  Abnormality of gait and mobility  Repeated falls     Problem List Patient Active Problem List   Diagnosis Date Noted   Chronic ischemic left MCA stroke 06/18/2021   Cholelithiasis without cholecystitis 06/07/2021   Nodule of apex of right lung  06/07/2021   History of CVA (cerebrovascular accident) 05/25/2021   Atrial fibrillation ([place])    Mass of breast, left 11/13/2020   Colon cancer screening 05/14/2019   Vitamin D deficiency 05/08/2018   [place] 06/20/2017   H/O fracture of fibula 01/21/2014   Osteopenia 01/07/2014   Family history of osteoporosis 11/29/2013   Estrogen deficiency 11/29/2013   Hyperlipidemia 11/29/2013   Encounter for Medicare annual wellness exam 10/29/2013   Irregular heartbeat 10/07/2013   Left thyroid nodule 09/26/2013   Enlarged thyroid 09/16/2013   Acute bronchitis 09/07/2013   Primary osteoarthritis of both knees 11/05/2012   Other screening mammogram 09/27/2011   Routine general medical examination at a health care facility 09/19/2011   Class 1 obesity due to excess calories with serious comorbidity and body mass index (BMI) of 34.0 to 34.9 in adult 06/06/2008   Prediabetes 06/06/2008   Anxiety state 04/17/2007   ALLERGIC RHINITIS 04/17/2007    Ansen Sayegh, PT, DPT 09/14/2021, 3:04 PM  [place] [facility] MAIN [facility] : [facility] SERVICES [address] [place], [place], [address] Phone: [phone]   Fax:  [phone]  Name: Deborah Carey MRN: [id number] Date of Birth: [birth year]

## 2021-09-14 NOTE — Patient Instructions (Signed)
Access Code: [email or web address] URL: [email or web address] Date: 09/14/2021 Prepared by: Blanche East  Exercises Standing Tandem Balance with Counter Support - 1 x daily - 3 x weekly - 1 sets - 10 reps - 10-20 hold Standing Single Leg Stance with Counter Support - 1 x daily - 3 x weekly - 3 sets - 10-20 hold Heel Raises with Counter Support - 1 x daily - 3 x weekly - 3 sets - 10 reps Seated Long Arc Quad - 1 x daily - 3 x weekly - 2 sets - 15 reps Sit to Stand - 1 x daily - 3 x weekly - 3 sets - 5 reps Side Stepping with Counter Support - 1 x daily - 3 x weekly - 1 sets - 3-5 reps Backward Walking with Counter Support - 1 x daily - 3 x weekly - 1 sets - 3-5 reps

## 2021-09-22 ENCOUNTER — Other Ambulatory Visit: Payer: Self-pay | Admitting: Family Medicine

## 2021-09-22 ENCOUNTER — Ambulatory Visit: Payer: Medicare PPO

## 2021-09-22 DIAGNOSIS — N6320 Unspecified lump in the left breast, unspecified quadrant: Secondary | ICD-10-CM

## 2021-09-22 DIAGNOSIS — N6310 Unspecified lump in the right breast, unspecified quadrant: Secondary | ICD-10-CM

## 2021-09-24 ENCOUNTER — Ambulatory Visit: Payer: Medicare PPO

## 2021-09-24 NOTE — Progress Notes (Signed)
Late entry for scoring of modified Rankin Score per stroke team request. Based on review of outpt PT note on 09/14/21    09/14/21 1600  Modified Rankin (Stroke Patients Only)  Modified Rankin Deborah Carey, PT   Acute Rehabilitation Services  Pager [phone] Office [phone] 09/24/2021

## 2021-09-25 ENCOUNTER — Ambulatory Visit: Payer: Medicare PPO | Attending: Otolaryngology

## 2021-09-25 DIAGNOSIS — G4710 Hypersomnia, unspecified: Secondary | ICD-10-CM | POA: Insufficient documentation

## 2021-09-25 DIAGNOSIS — I259 Chronic ischemic heart disease, unspecified: Secondary | ICD-10-CM | POA: Diagnosis not present

## 2021-09-25 DIAGNOSIS — G4733 Obstructive sleep apnea (adult) (pediatric): Secondary | ICD-10-CM | POA: Diagnosis not present

## 2021-09-25 DIAGNOSIS — I63512 Cerebral infarction due to unspecified occlusion or stenosis of left middle cerebral artery: Secondary | ICD-10-CM | POA: Insufficient documentation

## 2021-09-28 ENCOUNTER — Ambulatory Visit: Payer: Medicare PPO

## 2021-09-30 ENCOUNTER — Other Ambulatory Visit: Payer: Self-pay | Admitting: Family Medicine

## 2021-10-01 ENCOUNTER — Other Ambulatory Visit: Payer: Self-pay

## 2021-10-01 ENCOUNTER — Ambulatory Visit
Admission: RE | Admit: 2021-10-01 | Discharge: 2021-10-01 | Disposition: A | Discharge status: Home / Self Care | Payer: Medicare PPO | Source: Ambulatory Visit | Attending: Family Medicine | Admitting: Family Medicine

## 2021-10-01 ENCOUNTER — Ambulatory Visit: Payer: Medicare PPO

## 2021-10-01 ENCOUNTER — Other Ambulatory Visit: Payer: Self-pay | Admitting: Family Medicine

## 2021-10-01 DIAGNOSIS — N6320 Unspecified lump in the left breast, unspecified quadrant: Secondary | ICD-10-CM

## 2021-10-01 DIAGNOSIS — N6325 Unspecified lump in the left breast, overlapping quadrants: Secondary | ICD-10-CM | POA: Diagnosis not present

## 2021-10-01 DIAGNOSIS — N6310 Unspecified lump in the right breast, unspecified quadrant: Secondary | ICD-10-CM

## 2021-10-01 DIAGNOSIS — R922 Inconclusive mammogram: Secondary | ICD-10-CM | POA: Diagnosis not present

## 2021-10-01 IMAGING — MG DIGITAL DIAGNOSTIC BILAT W/ TOMO W/ CAD
8 series · 8 of 24 positions shown · non-contrast
Comparison: Previous exam(s).

CLINICAL DATA: BI-RADS 3 follow-up of 2 LEFT breast
masses/intramammary lymph nodes. Patient has a history of COVID
vaccination in [DATE]. patient denies any palpable mass in
the RIGHT breast.

EXAM:
DIGITAL DIAGNOSTIC BILATERAL MAMMOGRAM WITH TOMOSYNTHESIS AND CAD;
ULTRASOUND LEFT BREAST LIMITED
TECHNIQUE: Bilateral digital diagnostic mammography and breast tomosynthesis
was performed. The images were evaluated with computer-aided
detection.; Targeted ultrasound examination of the left breast was
performed.

[R MLO synth-2D]
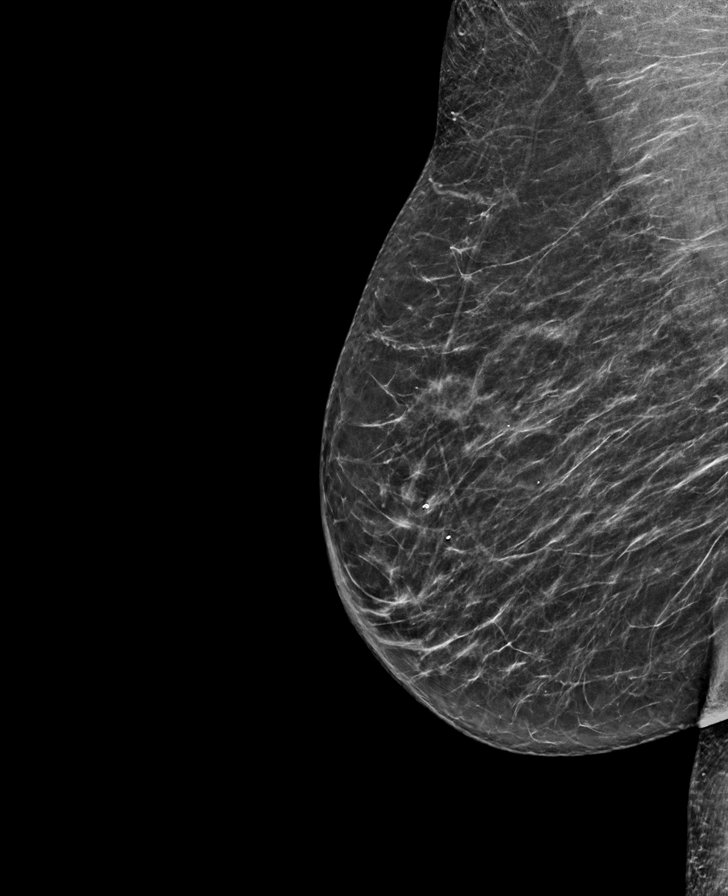

[L CC synth-2D]
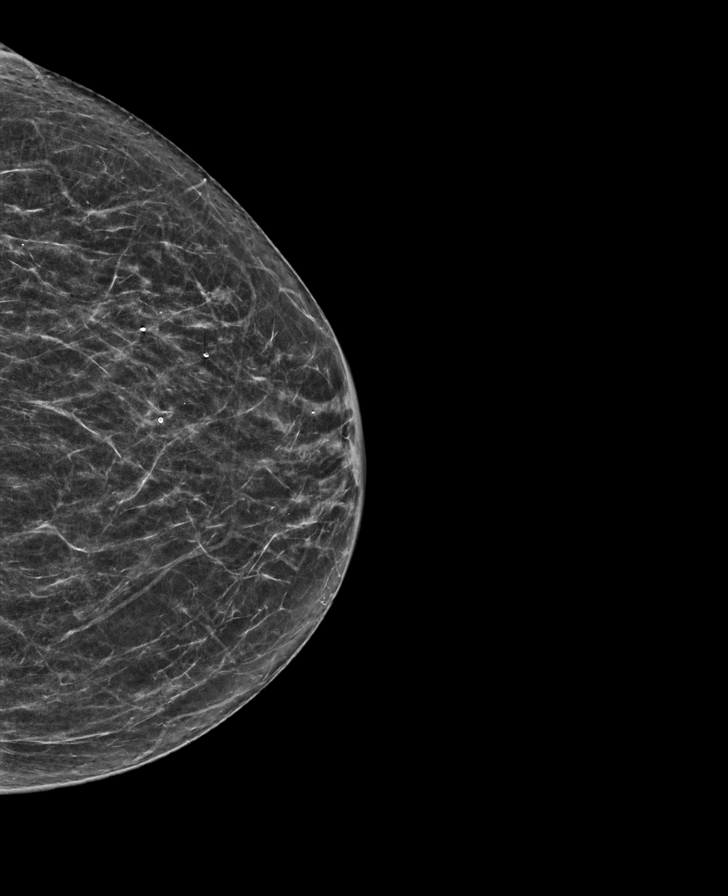

[R CC synth-2D]
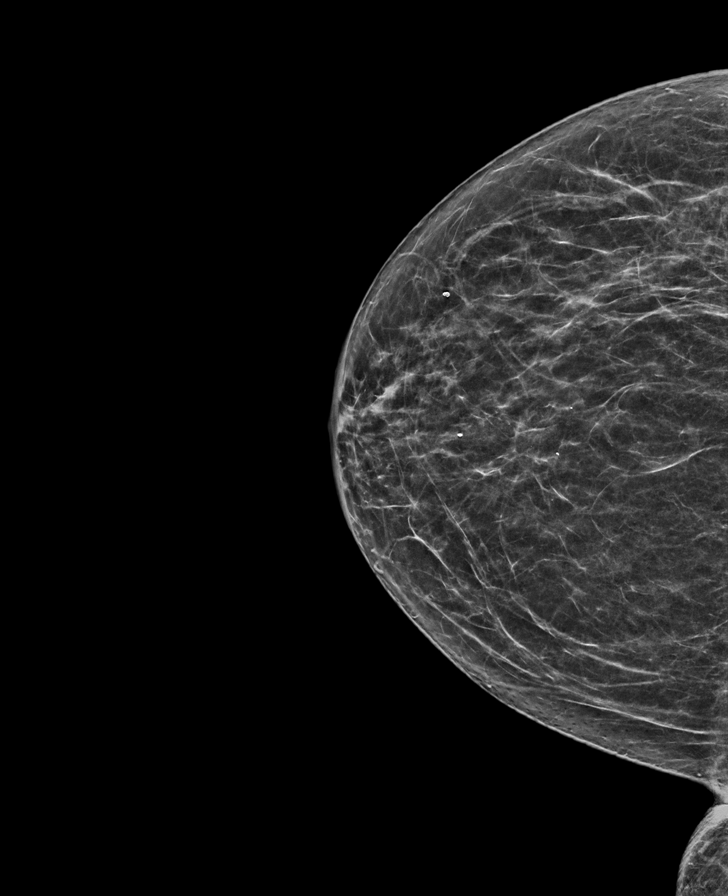

[L MLO synth-2D]
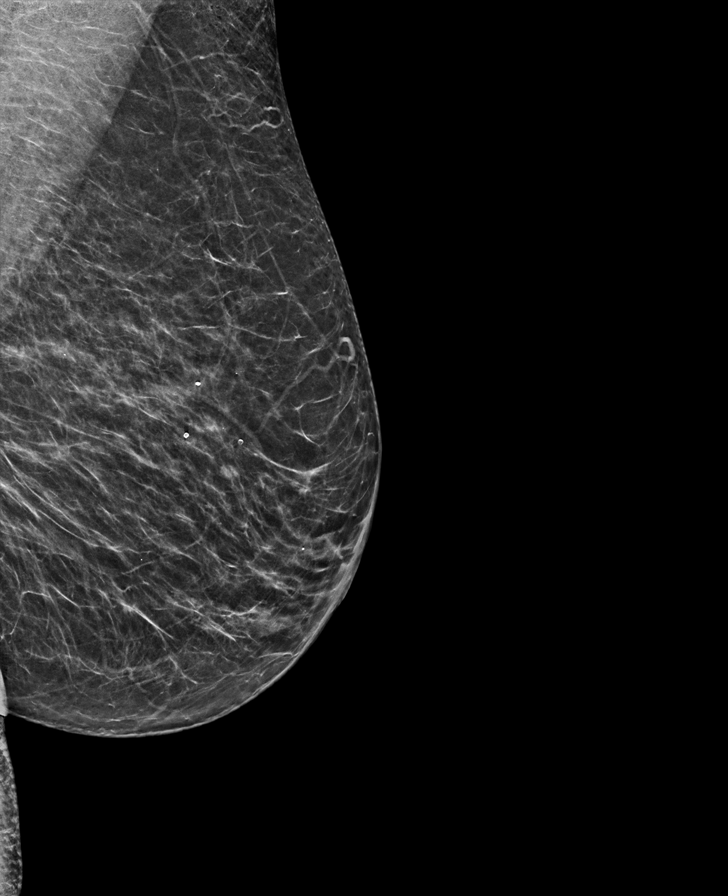

[L MLO tomo · tomo slice 31/62.0]
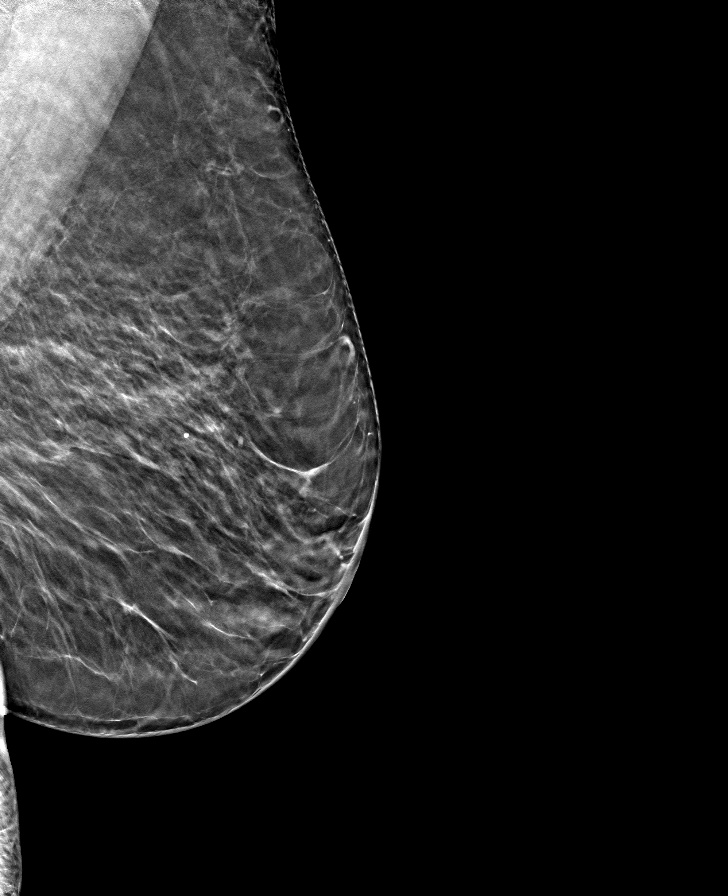

[R CC tomo · tomo slice 30/59.0]
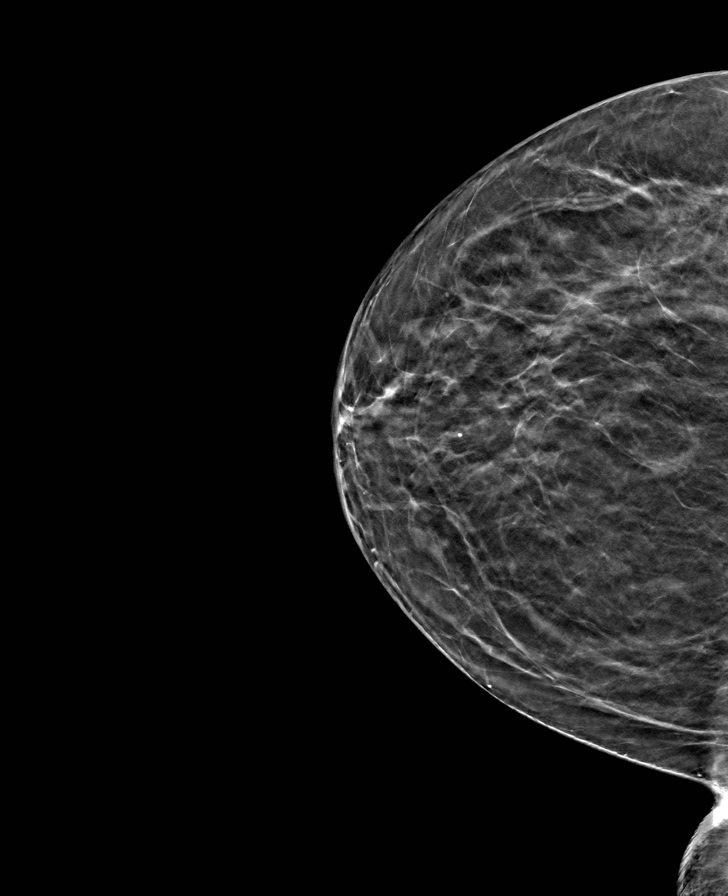

[R MLO tomo · tomo slice 33/64.0]
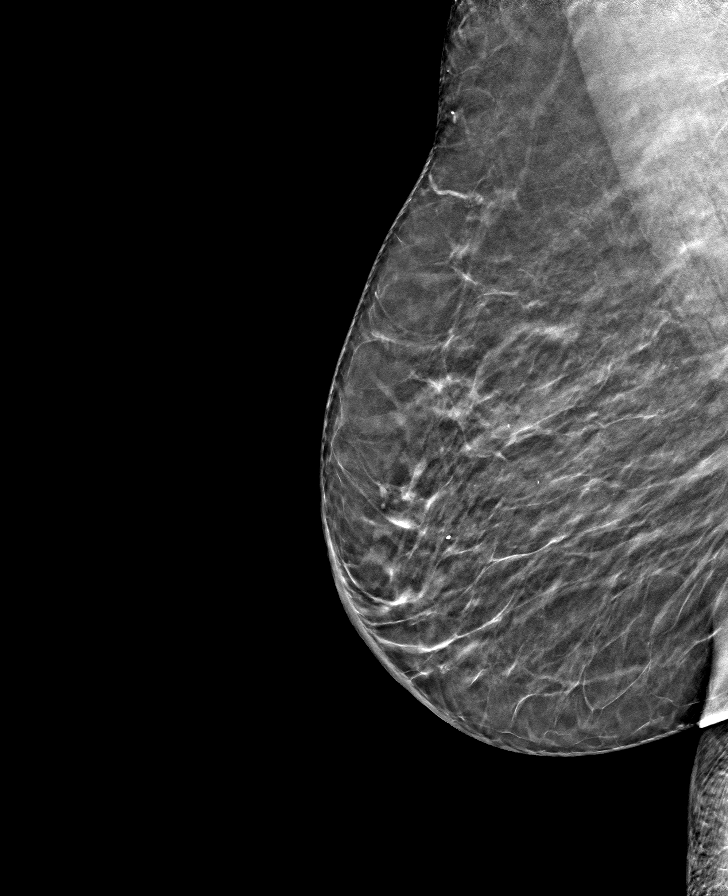

[L CC tomo · tomo slice 30/59.0]
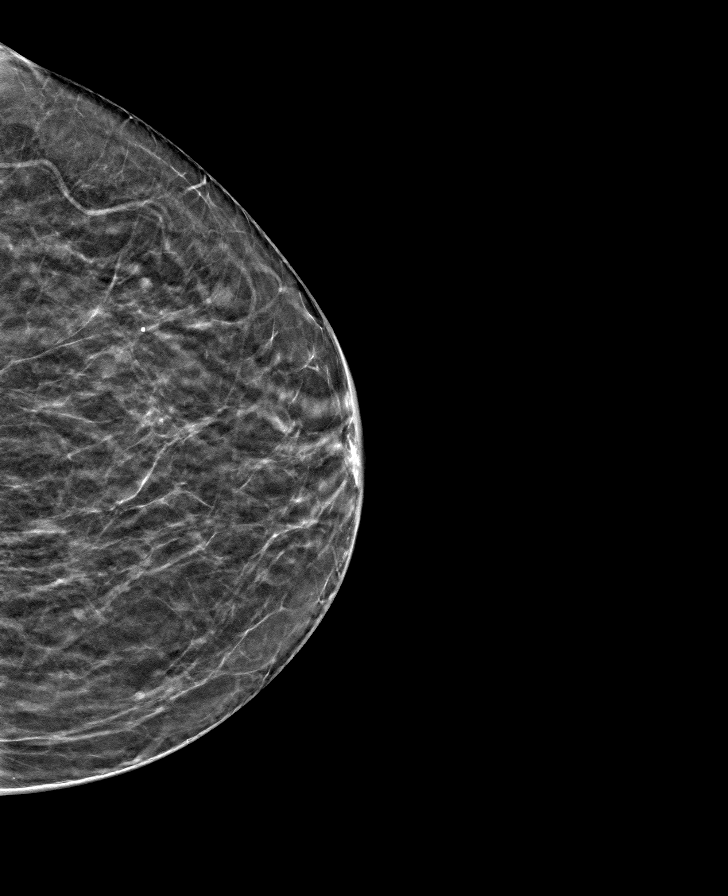

[8 of 24 positions shown; findings below may reference images not displayed]

ACR Breast Density Category b: There are scattered areas of
fibroglandular density.
FINDINGS: Diagnostic images demonstrate decrease in size of 2 adjacent oval
circumscribed masses in the LEFT outer breast. One demonstrates an
internal fatty notch consistent with a benign intramammary lymph
node. These both have resumed configuration stable dating back to at
least [FH], consistent with a reactive etiology. No suspicious mass,
distortion, or microcalcifications are identified to suggest
presence of malignancy.

Targeted ultrasound was performed of the LEFT outer breast. There is
revisualization of a LEFT breast intramammary lymph node at 3
o'clock 4 cm from the nipple. It demonstrates normal echogenic hilum
with a thin smooth cortex which is decreased in thickness since
prior ultrasound dated [DATE]. It spans 7 by 5 x 3 mm,
previously 8 by 4 x 5 mm. Additional oval circumscribed near
anechoic mass at 3 o'clock 6 cm from the nipple measures 3 by 3 x 2
mm, previously 4 x 3 by 4 mm.
IMPRESSION: 1. Interval decrease in size of 2 LEFT breast masses to their
baseline configuration. This is consistent with a reactive etiology.
2. No mammographic evidence of malignancy bilaterally.

RECOMMENDATION:
Screening mammogram in one year.(Code:[FH])

I have discussed the findings and recommendations with the patient.
If applicable, a reminder letter will be sent to the patient
regarding the next appointment.

BI-RADS CATEGORY  2: Benign.

## 2021-10-01 IMAGING — US US BREAST*L* LIMITED INC AXILLA
1 series · 13 of 15 positions shown · non-contrast
Comparison: Previous exam(s).

CLINICAL DATA: BI-RADS 3 follow-up of 2 LEFT breast
masses/intramammary lymph nodes. Patient has a history of COVID
vaccination in [DATE]. patient denies any palpable mass in
the RIGHT breast.

EXAM:
DIGITAL DIAGNOSTIC BILATERAL MAMMOGRAM WITH TOMOSYNTHESIS AND CAD;
ULTRASOUND LEFT BREAST LIMITED
TECHNIQUE: Bilateral digital diagnostic mammography and breast tomosynthesis
was performed. The images were evaluated with computer-aided
detection.; Targeted ultrasound examination of the left breast was
performed.

[Series 1: us breast*left* limited inc axilla · 0.05mm/px · 13 of 15 slices shown]
[im 1/15]
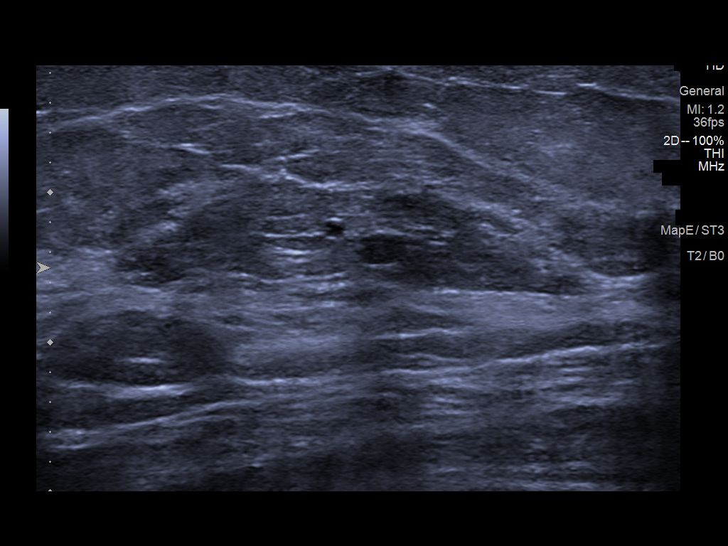
[im 2/15]
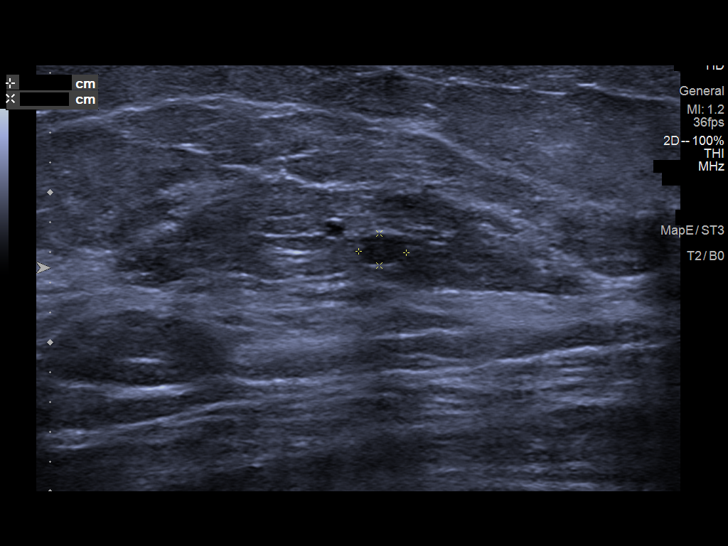
[im 3/15]
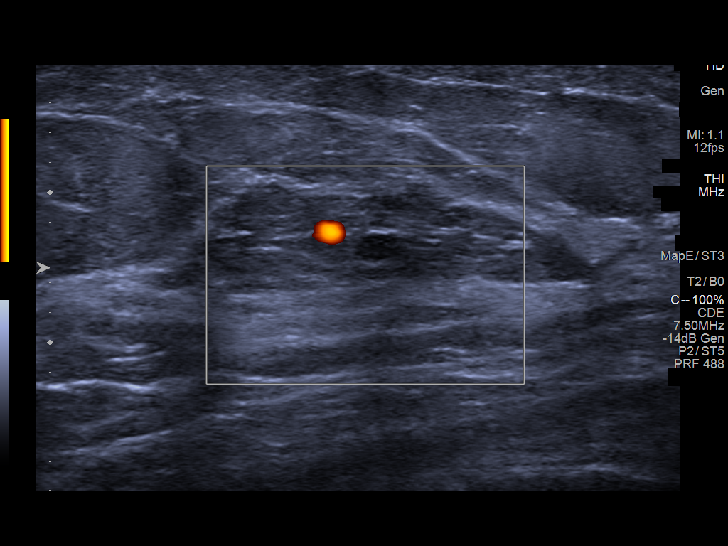
[im 5/15]
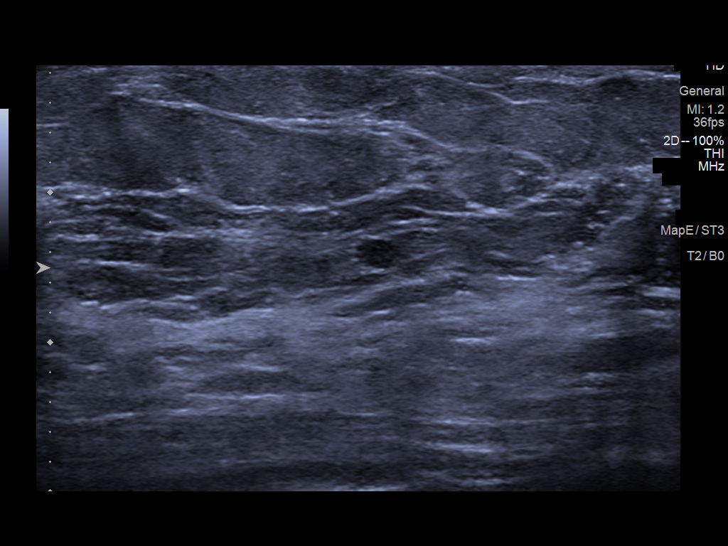
[im 6/15]
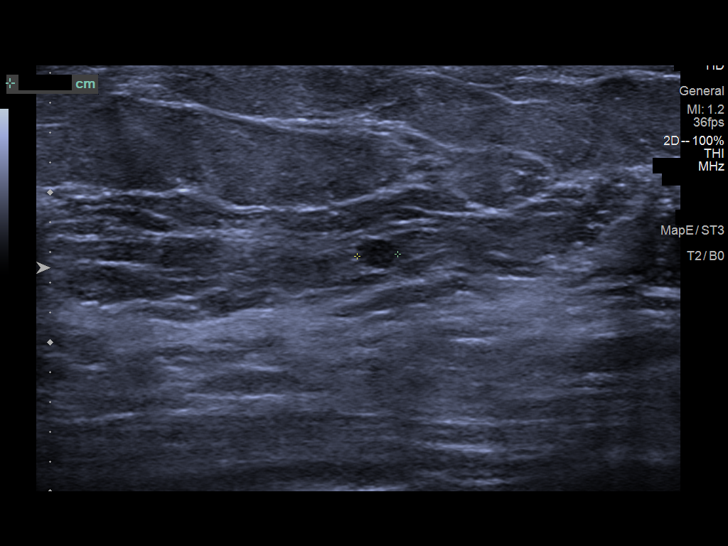
[im 7/15]
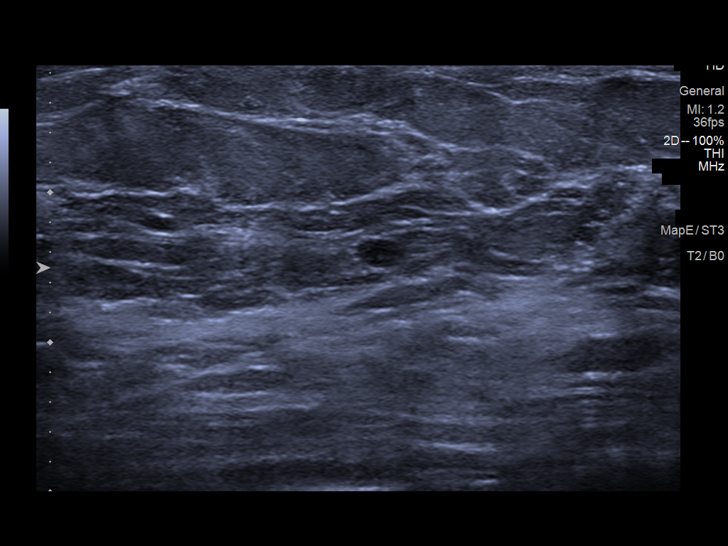
[im 8/15]
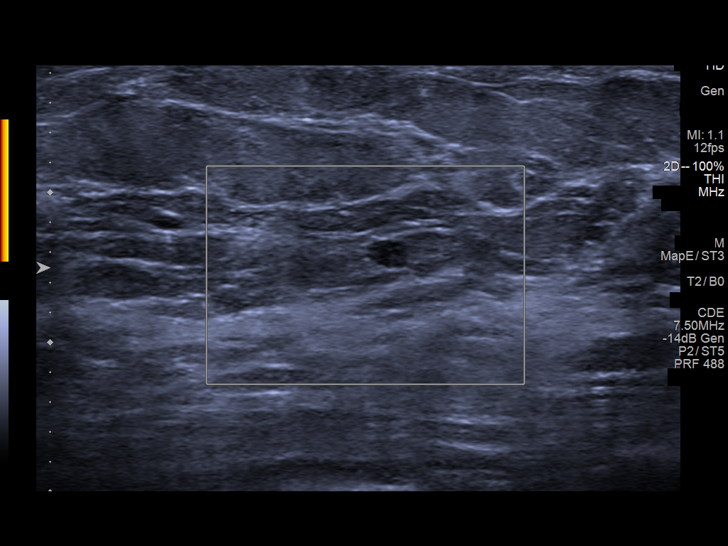
[im 9/15]
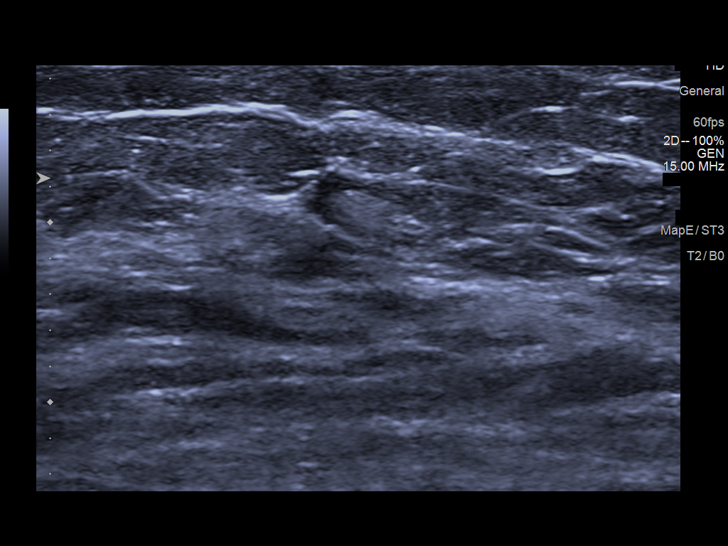
[im 10/15]
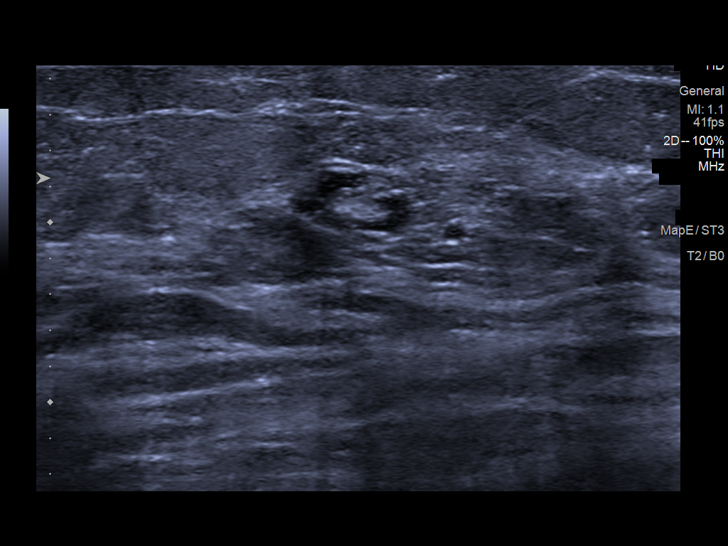
[im 11/15]
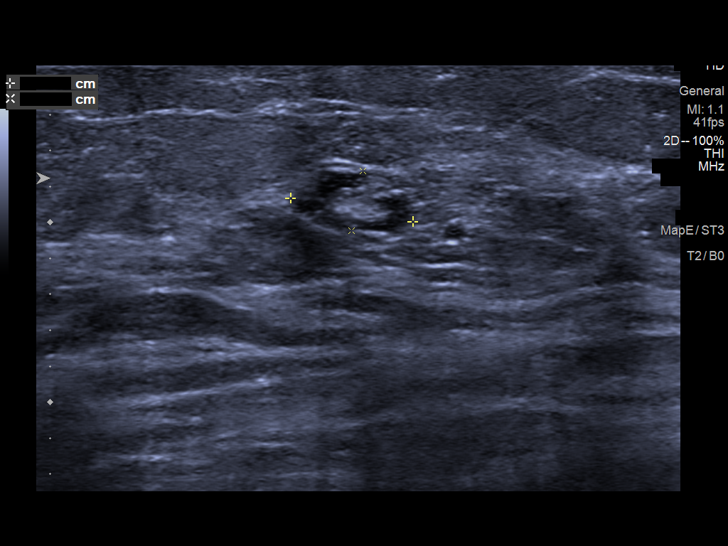
[im 13/15]
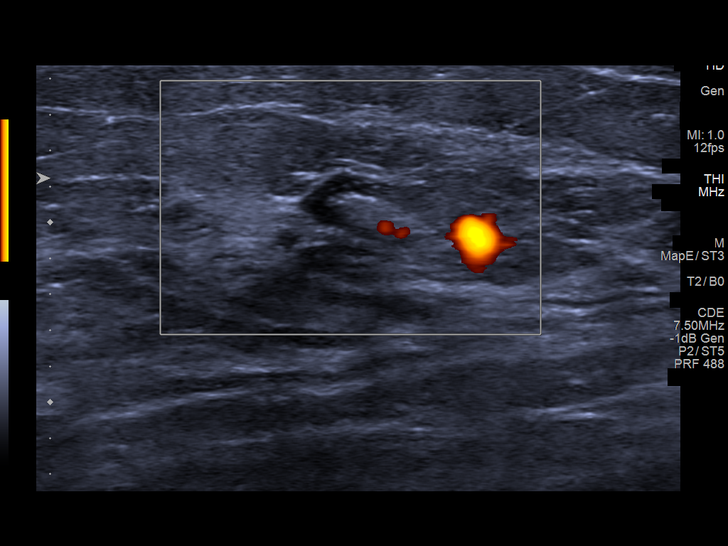
[im 14/15]
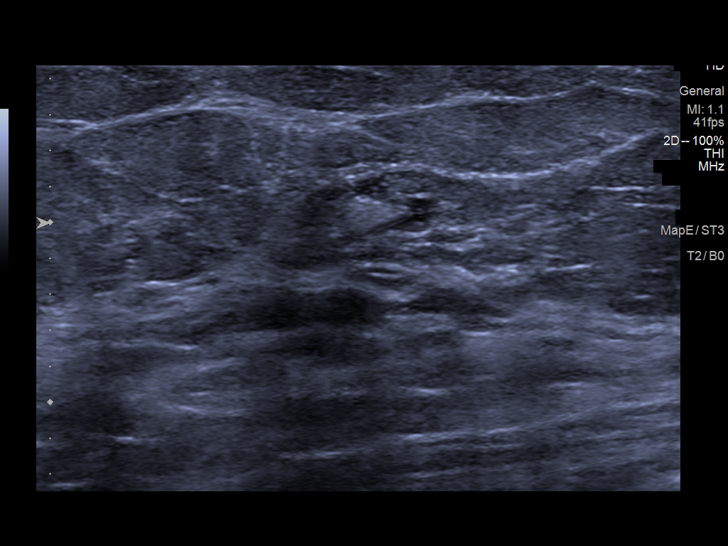
[im 15/15]
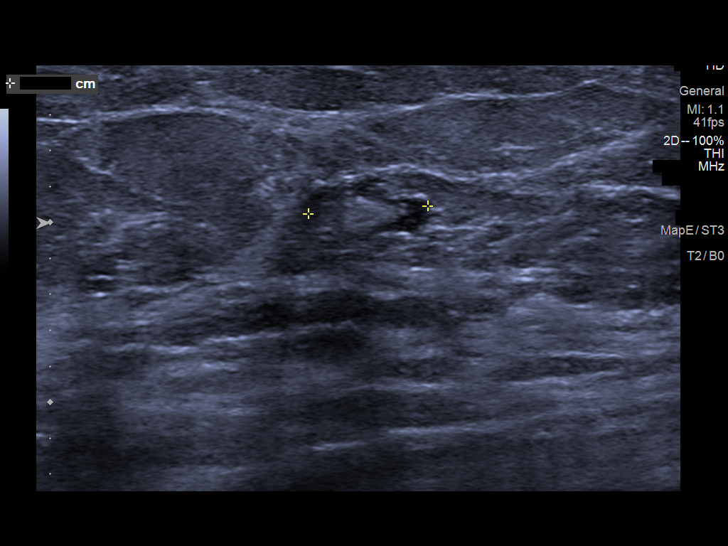

[13 of 15 positions shown; findings below may reference images not displayed]

ACR Breast Density Category b: There are scattered areas of
fibroglandular density.
FINDINGS: Diagnostic images demonstrate decrease in size of 2 adjacent oval
circumscribed masses in the LEFT outer breast. One demonstrates an
internal fatty notch consistent with a benign intramammary lymph
node. These both have resumed configuration stable dating back to at
least [FH], consistent with a reactive etiology. No suspicious mass,
distortion, or microcalcifications are identified to suggest
presence of malignancy.

Targeted ultrasound was performed of the LEFT outer breast. There is
revisualization of a LEFT breast intramammary lymph node at 3
o'clock 4 cm from the nipple. It demonstrates normal echogenic hilum
with a thin smooth cortex which is decreased in thickness since
prior ultrasound dated [DATE]. It spans 7 by 5 x 3 mm,
previously 8 by 4 x 5 mm. Additional oval circumscribed near
anechoic mass at 3 o'clock 6 cm from the nipple measures 3 by 3 x 2
mm, previously 4 x 3 by 4 mm.
IMPRESSION: 1. Interval decrease in size of 2 LEFT breast masses to their
baseline configuration. This is consistent with a reactive etiology.
2. No mammographic evidence of malignancy bilaterally.

RECOMMENDATION:
Screening mammogram in one year.(Code:[FH])

I have discussed the findings and recommendations with the patient.
If applicable, a reminder letter will be sent to the patient
regarding the next appointment.

BI-RADS CATEGORY  2: Benign.

## 2021-10-01 NOTE — Patient Outreach (Signed)
[facility]) Care Management  10/01/2021  Delbert [employer] 1951-02-03 [id number]   No telephone outreach to patient. mRS was successfully completed on 09/14/21 by therapy supervisor. MRS=2   [place] Care Management Assistant

## 2021-10-05 ENCOUNTER — Ambulatory Visit: Payer: Medicare PPO

## 2021-10-07 ENCOUNTER — Other Ambulatory Visit: Payer: Self-pay

## 2021-10-07 ENCOUNTER — Ambulatory Visit: Payer: Medicare PPO

## 2021-10-07 ENCOUNTER — Encounter: Payer: Self-pay | Admitting: Pulmonary Disease

## 2021-10-07 ENCOUNTER — Ambulatory Visit: Payer: Medicare PPO | Admitting: Pulmonary Disease

## 2021-10-07 VITALS — BP 110/80 | HR 76 | Temp 97.1°F | Ht 64.0 in | Wt 190.4 lb

## 2021-10-07 DIAGNOSIS — R911 Solitary pulmonary nodule: Secondary | ICD-10-CM

## 2021-10-07 DIAGNOSIS — Z8673 Personal history of transient ischemic attack (TIA), and cerebral infarction without residual deficits: Secondary | ICD-10-CM

## 2021-10-07 DIAGNOSIS — R918 Other nonspecific abnormal finding of lung field: Secondary | ICD-10-CM

## 2021-10-07 NOTE — Patient Instructions (Addendum)
We will do a CT of the chest without contrast towards the end of February 1 part of March.  We will see him in follow-up after that is done.   The CT will be to follow-up on your lung nodule(s).  We will schedule the test at the outpatient center as you had it before.  Currently the larger one of your lung nodules is the size of a pea which is the 1 on the right side:

## 2021-10-07 NOTE — Progress Notes (Signed)
Subjective:    Patient ID: Deborah Carey, female    DOB: 09-21-1950, 71 y.o.   MRN: [id number] Chief Complaint  Patient presents with   Follow-up    HPI Patient is a 71 year old lifelong never smoker who presents for follow-up of a lung nodule noted during a head and neck CT performed during evaluation of the patient during an active stroke.The CT study in question was performed at [facility].  The patient was noted to have a 1.1 cm x 1.0 cm soft tissue nodule in the medial right apex.  She has not had a dedicated CT chest.  She was admitted to [facility] in September 2022 due to a stroke.  The patient has been asymptomatic with regards to the chest findings.  She has not had any weight loss nor anorexia.  No cough or sputum production.  No hemoptysis.  She has A-fib and is on Eliquis.  She is still undergoing rehab for her stroke.  She does not endorse any other symptomatology.  We initially evaluated her here on 23 July 2021.  For the details of that visit please refer to that note.  At that time we ordered a dedicated chest CT this was performed 16 August 2021 this showed multiple bilateral pulmonary nodules measuring up to 10 mm in the right upper lobe.  A noncontrast chest CT at 3 to 6 months was recommended and follow-up Fleischner Society guidelines after that.  I reviewed that follow-up CT with the patient and her husband who is with her today.   The patient stresses that she is not inclined to have any basic procedures.  I have advised her that at present it does not appear that she will need biopsy of these nodules but will need ongoing surveillance.  She is satisfied with this plan.  Review of Systems A 10 point review of systems was performed and it is as noted above otherwise negative.  Patient Active Problem List   Diagnosis Date Noted   Chronic ischemic left MCA stroke 06/18/2021   Cholelithiasis 06/07/2021   Nodule of apex of right lung 06/07/2021   History of CVA  (cerebrovascular accident) 05/25/2021   Atrial fibrillation (HCC)    Colon cancer screening 05/14/2019   Vitamin D deficiency 05/08/2018   Flushing 06/20/2017   H/O fracture of fibula 01/21/2014   Osteopenia 01/07/2014   Family history of osteoporosis 11/29/2013   Estrogen deficiency 11/29/2013   Hyperlipidemia 11/29/2013   Encounter for Medicare annual wellness exam 10/29/2013   Left thyroid nodule 09/26/2013   Enlarged thyroid 09/16/2013   Primary osteoarthritis of both knees 11/05/2012   Other screening mammogram 09/27/2011   Routine general medical examination at a health care facility 09/19/2011   Obesity (BMI 30-39.9) 06/06/2008   Prediabetes 06/06/2008   Social History   Tobacco Use   Smoking status: Never   Smokeless tobacco: Never  Substance Use Topics   Alcohol use: No    Alcohol/week: 0.0 standard drinks of alcohol   No Known Allergies  Current Meds  Medication Sig   acetaminophen (TYLENOL) 325 MG tablet Take 1-2 tablets (325-650 mg total) by mouth every 4 (four) hours as needed for mild pain.   apixaban (ELIQUIS) 5 MG TABS tablet Take 1 tablet (5 mg total) by mouth 2 (two) times daily.   atorvastatin (LIPITOR) 40 MG tablet Take 1 tablet (40 mg total) by mouth daily.   metoprolol succinate (TOPROL-XL) 25 MG 24 hr tablet Take 1 tablet (25 mg  total) by mouth daily.   pantoprazole (PROTONIX) 40 MG tablet Take 1 tablet (40 mg total) by mouth daily. For reflux--can use as needed if symptoms controlled.   TraZODone (DESYREL) 50 MG tablet Take 0.5-1 tablets (25-50 mg total) by mouth at bedtime as needed for sleep.   Immunization History  Administered Date(s) Administered   Fluad Quad(high Dose 65+) 05/14/2019, 05/29/2020   Influenza Split 09/27/2011   Influenza, High Dose Seasonal PF 07/18/2017   Influenza,inj,Quad PF,6+ Mos 09/09/2015, 04/29/2016, 05/08/2018   PFIZER Comirnaty(Gray Top)Covid-19 Tri-Sucrose Vaccine 10/10/2019, 10/31/2019   PFIZER(Purple  Top)SARS-COV-2 Vaccination 07/27/2020   Pneumococcal Conjugate-13 04/28/2015   Pneumococcal Polysaccharide-23 11/29/2013   Td 01/21/2003   Tdap 11/05/2012       Objective:   Physical Exam BP 110/80 (BP Location: Left Arm, Patient Position: Sitting, Cuff Size: Normal)   Pulse 76   Temp (!) 97.1 F (36.2 C) (Oral)   Ht 5\' 4"  (1.626 m)   Wt 190 lb 6.4 oz (86.4 kg)   LMP 09/05/2008   SpO2 97%   BMI 32.68 kg/m   SpO2: 97 % O2 Device: None (Room air)  GENERAL: Overweight woman, no acute distress, HEAD: Normocephalic, atraumatic.  EYES: Pupils equal, round, reactive to light. No scleral icterus.  MOUTH: Nose/mouth/throat not examined due to institutional masking requirements for COVID-19. NECK: Supple. No thyromegaly. Trachea midline. No JVD. No adenopathy. PULMONARY: Good air entry bilaterally. No adventitious sounds. CARDIOVASCULAR: S1 and S2. Regular rate and rhythm with controlled ventricular response.  ABDOMEN: Obese otherwise benign. MUSCULOSKELETAL: No joint deformity, no clubbing, no edema.  NEUROLOGIC: Right-sided weakness poststroke. No other focal deficits. SKIN: Intact,warm,dry. PSYCH: Flat affect, normal behavior.   Independently reviewed her chest CT performed 16 August 2021.  Images shown to the patient and husband as well.     Assessment & Plan:     ICD-10-CM   1. Multiple lung nodules on CT  R91.8 CT CHEST WO CONTRAST   Continue active surveillance Repeat CT chest 3 months from prior CT Patient asymptomatic    2. History of CVA (cerebrovascular accident)  Z86.73    This issue adds complexity to her management Currently on Eliquis Continues follow-up with neurology     Orders Placed This Encounter  Procedures   CT CHEST WO CONTRAST    Standing Status:   Future    Number of Occurrences:   1    Standing Expiration Date:   10/07/2022    Scheduling Instructions:     Schedule for end of February beginning of march.    Order Specific Question:    Preferred imaging location?    Answer:   [facility]   Will see the patient in follow-up in 4 to 6 weeks time she is to contact us prior to that time should any new difficulties arise.  Gailen Shelter, MD Advanced Bronchoscopy PCCM [facility] [facility]    *This note was dictated using voice recognition software/Dragon.  Despite best efforts to proofread, errors can occur which can change the meaning. Any transcriptional errors that result from this process are unintentional and may not be fully corrected at the time of dictation.

## 2021-10-13 ENCOUNTER — Ambulatory Visit: Payer: Medicare PPO | Admitting: Physical Therapy

## 2021-10-15 ENCOUNTER — Ambulatory Visit: Payer: Medicare PPO

## 2021-10-15 ENCOUNTER — Telehealth: Payer: Self-pay

## 2021-10-15 NOTE — Telephone Encounter (Signed)
HST replaced in Dr. Zoila Shutter look at folder for review.

## 2021-10-20 ENCOUNTER — Ambulatory Visit: Payer: Medicare PPO

## 2021-10-20 NOTE — Telephone Encounter (Signed)
Dr. Kasa, please advise.    

## 2021-10-21 ENCOUNTER — Other Ambulatory Visit: Payer: Self-pay

## 2021-10-22 ENCOUNTER — Ambulatory Visit: Payer: Medicare PPO

## 2021-10-25 NOTE — Telephone Encounter (Signed)
Dr. Mortimer Fries has been out office office.  He will return 10/26/21

## 2021-10-27 ENCOUNTER — Ambulatory Visit: Payer: Medicare PPO | Admitting: Physical Therapy

## 2021-10-27 ENCOUNTER — Other Ambulatory Visit: Payer: Self-pay | Admitting: Family Medicine

## 2021-10-27 NOTE — Telephone Encounter (Signed)
Last OV was a hospital f/u on 06/17/21, last filled on 07/02/21 #30 tabs with 3 refills

## 2021-10-29 NOTE — Telephone Encounter (Signed)
HST reviewed by Dr. Dolphus Jenny OSA. Recommend in lab titration study.   Patient is aware of results and voiced her understanding.  She stated that she would like to hold off on cpap and titration study at this time.  Nothing further needed.   Routing to Dr. Mortimer Fries as an Juluis Rainier.

## 2021-11-02 DIAGNOSIS — Z961 Presence of intraocular lens: Secondary | ICD-10-CM | POA: Diagnosis not present

## 2021-11-11 ENCOUNTER — Ambulatory Visit
Admission: RE | Admit: 2021-11-11 | Discharge: 2021-11-11 | Disposition: A | Discharge status: Home / Self Care | Payer: Medicare PPO | Source: Ambulatory Visit | Attending: Pulmonary Disease | Admitting: Pulmonary Disease

## 2021-11-11 ENCOUNTER — Other Ambulatory Visit: Payer: Self-pay

## 2021-11-11 DIAGNOSIS — R918 Other nonspecific abnormal finding of lung field: Secondary | ICD-10-CM | POA: Diagnosis not present

## 2021-11-11 DIAGNOSIS — R911 Solitary pulmonary nodule: Secondary | ICD-10-CM | POA: Diagnosis not present

## 2021-11-11 DIAGNOSIS — I700 Atherosclerosis of aorta: Secondary | ICD-10-CM | POA: Diagnosis not present

## 2021-11-11 IMAGING — CT CT CHEST W/O CM
2 of 4 series · 15 of 36 positions shown, 18 images · non-contrast
Comparison: [DATE]

CLINICAL DATA: Follow-up lung nodules



[Series 2: chest 2.00 · axial · 0.63mm/px · z∈[-1162,-910]mm · 12 of 150 slices shown, 15 images]
[im 12/150  mediastinal]
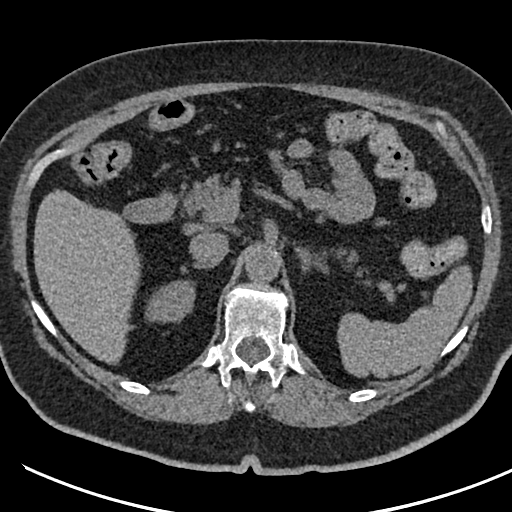
[im 12/150  lung]
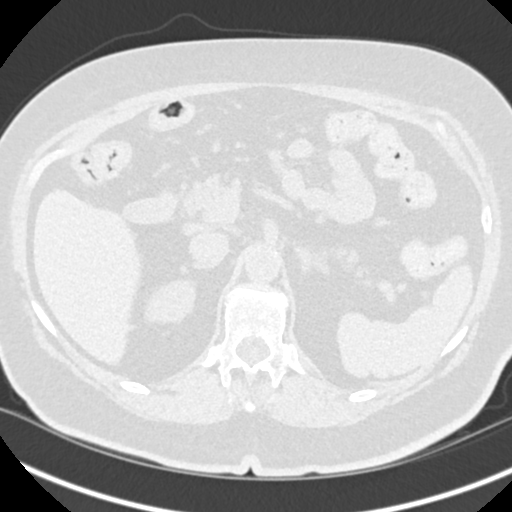
[im 23/150  lung]
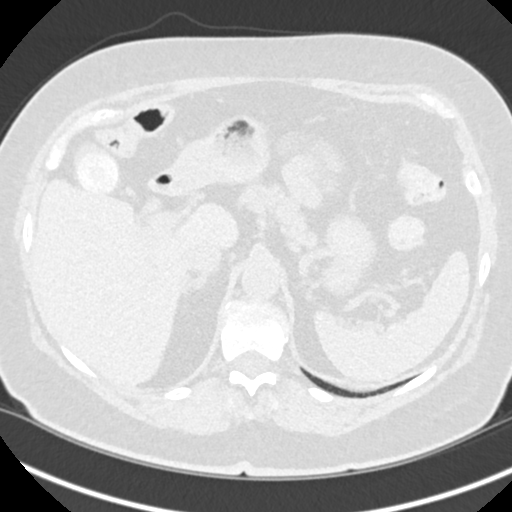
[im 35/150  lung]
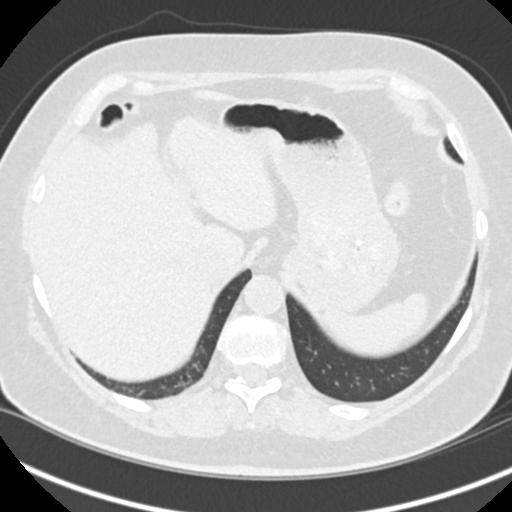
[im 46/150  lung]
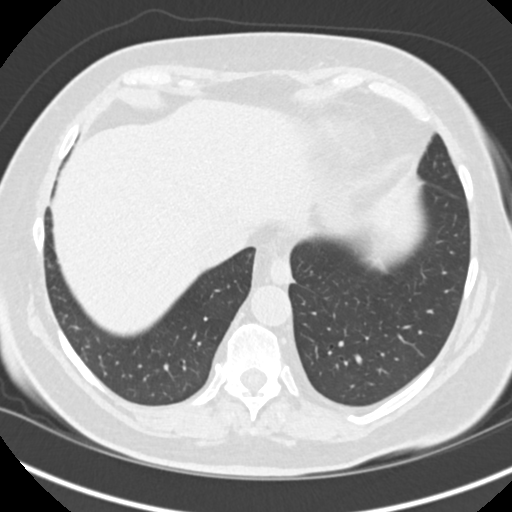
[im 58/150  mediastinal]
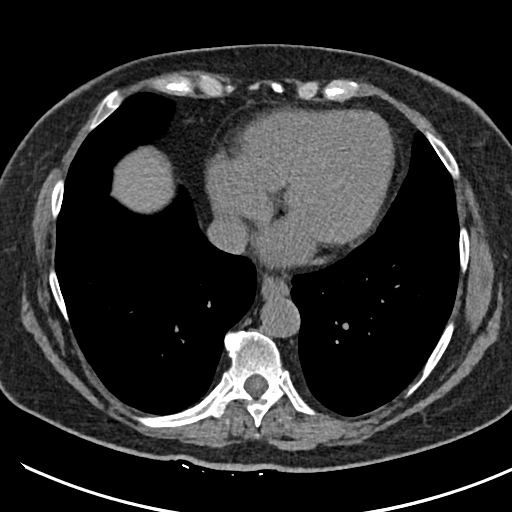
[im 58/150  lung]
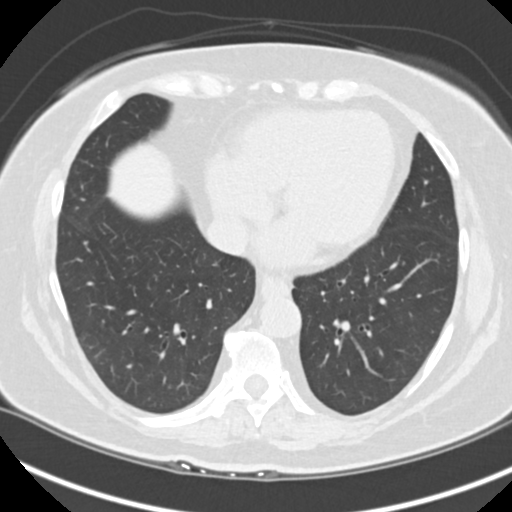
[im 69/150  lung]
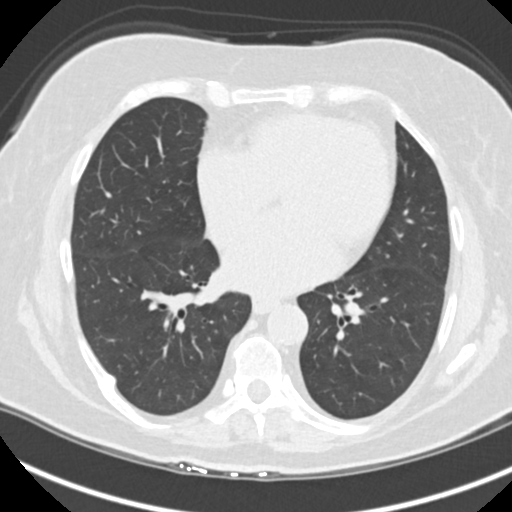
[im 81/150  lung]
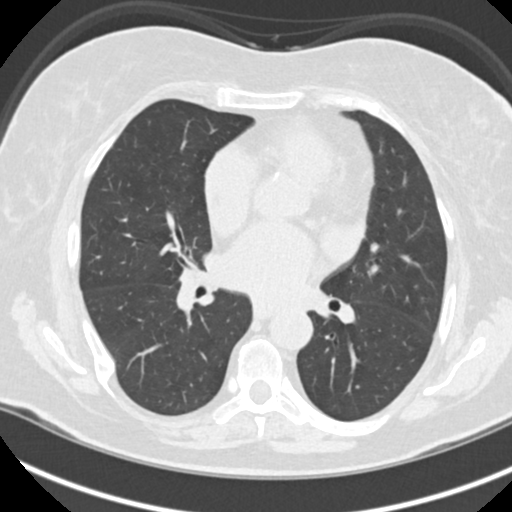
[im 92/150  lung]
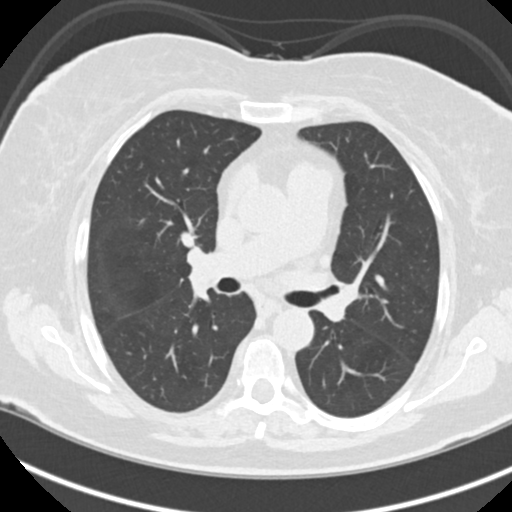
[im 104/150  mediastinal]
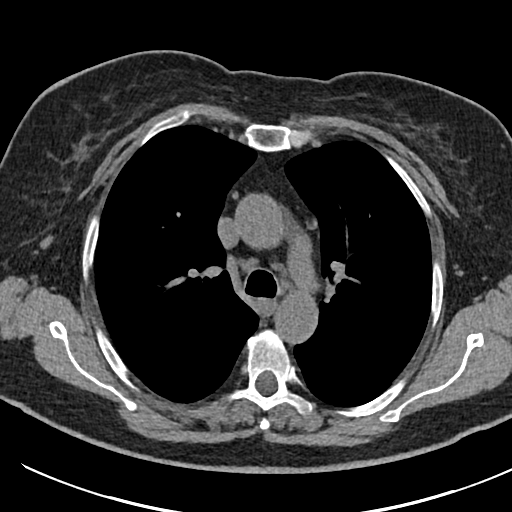
[im 104/150  lung]
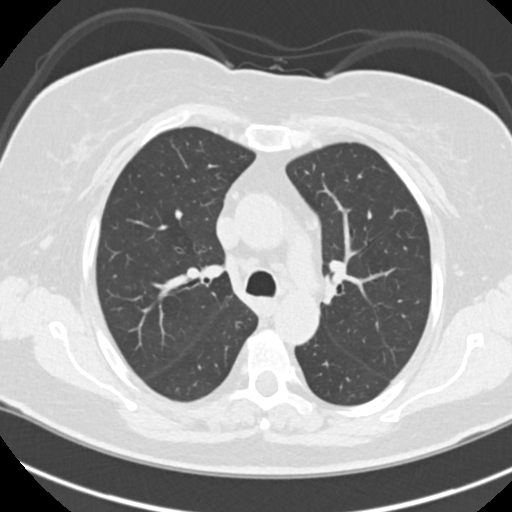
[im 115/150  lung]
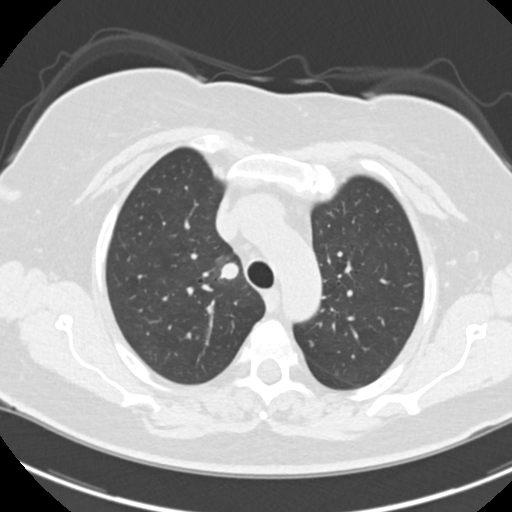
[im 127/150  lung]
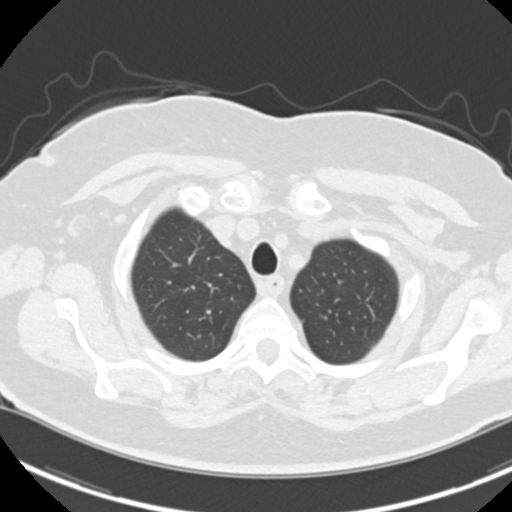
[im 138/150  lung]
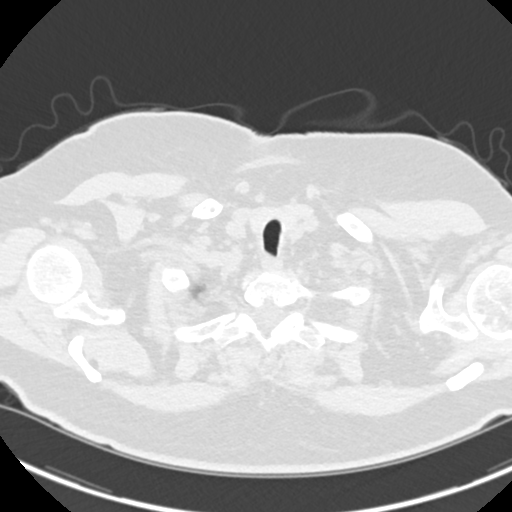

[Series 5: coronals chest 2.00 cor · coronal · 0.59mm/px · 3 of 141 slices shown]
[im 29/141  lung]
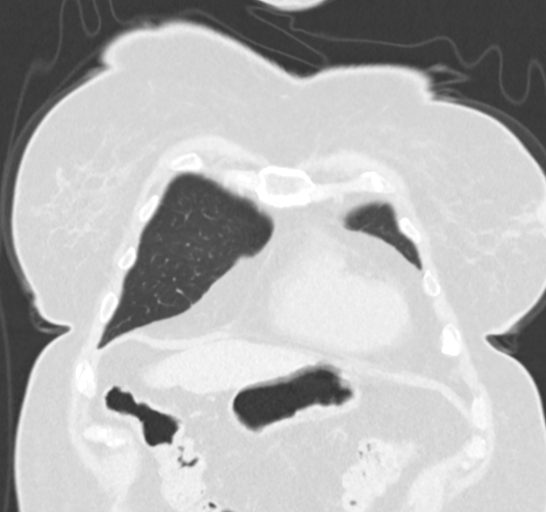
[im 57/141  lung]
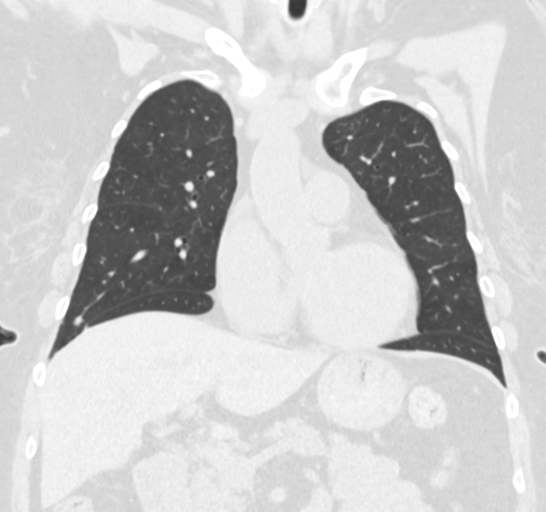
[im 85/141  lung]
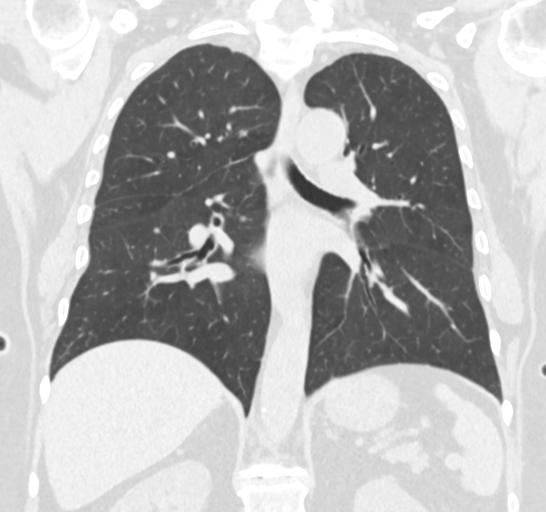

[15 of 36 positions shown; findings below may reference images not displayed]

FINDINGS: Cardiovascular: Aortic atherosclerosis. Normal heart size. No
pericardial effusion.

Mediastinum/Nodes: No enlarged mediastinal, hilar, or axillary lymph
nodes. Thyroid gland, trachea, and esophagus demonstrate no
significant findings.

Lungs/Pleura: Dominant nodule of the medial right upper lobe is
unchanged measuring 1.0 x 0.9 cm (series 3, image 35). Multiple
additional smaller bilateral pulmonary nodules, most concentrated in
the upper lobes are likewise unchanged, for example a 0.3 cm nodule
of the left apex (series 3, image 27). No new nodules appreciated.
No pleural effusion or pneumothorax.

Upper Abdomen: No acute abnormality. Large, calcified gallstone in
the gallbladder fundus.

Musculoskeletal: No chest wall abnormality. No suspicious osseous
lesions identified. Chronic, callused fractures of the posterior
right ribs.
IMPRESSION: 1. Multiple bilateral pulmonary nodules are unchanged, largest in
the medial right upper lobe measuring 1.0 x 0.9 cm. No new nodules.
Future CT at 18-24 months (from initial baseline scan) is considered
optional for low-risk patients, but is recommended for high-risk
patients. This recommendation follows the consensus statement:
Guidelines for Management of Incidental Pulmonary Nodules Detected
[DATE].
2. Cholelithiasis.

Aortic Atherosclerosis ([BO]-[BO]).

## 2021-11-24 ENCOUNTER — Encounter: Payer: Self-pay | Admitting: Pulmonary Disease

## 2021-11-24 ENCOUNTER — Ambulatory Visit: Payer: Medicare PPO | Admitting: Pulmonary Disease

## 2021-11-24 ENCOUNTER — Other Ambulatory Visit: Payer: Self-pay

## 2021-11-24 VITALS — BP 106/80 | HR 55 | Temp 97.3°F | Ht 64.0 in | Wt 184.4 lb

## 2021-11-24 DIAGNOSIS — R918 Other nonspecific abnormal finding of lung field: Secondary | ICD-10-CM | POA: Diagnosis not present

## 2021-11-24 DIAGNOSIS — G4733 Obstructive sleep apnea (adult) (pediatric): Secondary | ICD-10-CM

## 2021-11-24 NOTE — Progress Notes (Signed)
Subjective:    Patient ID: Deborah Carey, female    DOB: 10-27-1950, 71 y.o.   MRN: [id number] Patient Care Team: Glori Bickers, Wynelle Fanny, MD as PCP - General Kate Sable, MD as PCP - Cardiology (Cardiology) Regina Eck, CNM as Referring Physician (Certified Nurse Midwife) Conception Chancy, DDS as Referring Physician (Dentistry)  Chief Complaint Regina Eck, CNM as Referring Physician (Certified Nurse Midwife) Conception Chancy, DDS as Referring Physician (Dentistry)  Chief Complaint  Patient presents with   Follow-up   HPI Patient is a 71 year old lifelong never smoker who follows here on the issue of lung nodules.  Initially evaluated here on 23 July 2021 at that time, only imaging that was available was a head and neck CT performed during evaluation of the patient during an active stroke.  She was admitted to [facility] in September 2022 due to a stroke.  The nodule in question on the right upper lobe was then noted.  She had dedicated follow-up CT chest on 16 August 2021.  This showed that she actually has multiple bilateral pulmonary nodules the largest of which is the right upper lobe nodule measuring 10 mm.'s are well-circumscribed and not spiculated.  A dedicated CT chest was recommended at 3 to 6 months.  She had this performed on 11 November 2021 no stability of these nodules.  As the patient is not high risk a future CT at 18 to 24 months is recommended.  The patient remains asymptomatic with regards to these findings.  Of note she was evaluated by Dr. Mortimer Fries for sleep apnea.  She was actually noted to have severe sleep apnea by home sleep study.  However, the patient has been reluctant to undergo titration study which is necessary follow-up.    Review of Systems A 10 point review of systems was performed and it is as noted above otherwise negative.  Patient Active Problem List   Diagnosis Date Noted   Chronic ischemic left MCA stroke 06/18/2021   Cholelithiasis without cholecystitis 06/07/2021   Nodule of apex of right lung 06/07/2021   History of CVA (cerebrovascular accident) 05/25/2021   Atrial fibrillation  ([place])    Mass of breast, left 11/13/2020   Colon cancer screening 05/14/2019   Vitamin D deficiency 05/08/2018   Flushing 06/20/2017   H/O fracture of fibula 01/21/2014   Osteopenia 01/07/2014   Family history of osteoporosis 11/29/2013   Estrogen deficiency 11/29/2013   Hyperlipidemia 11/29/2013   Encounter for Medicare annual wellness exam 10/29/2013   Irregular heartbeat 10/07/2013   Left thyroid nodule 09/26/2013   Enlarged thyroid 09/16/2013   Acute bronchitis 09/07/2013   Primary osteoarthritis of both knees 11/05/2012   Other screening mammogram 09/27/2011   Routine general medical examination at a health care facility 09/19/2011   Class 1 obesity due to excess calories with serious comorbidity and body mass index (BMI) of 34.0 to 34.9 in adult 06/06/2008   Prediabetes 06/06/2008   Anxiety state 04/17/2007   ALLERGIC RHINITIS 04/17/2007   Social History   Tobacco Use   Smoking status: Never   Smokeless tobacco: Never  Substance Use Topics   Alcohol use: No    Alcohol/week: 0.0 standard drinks   No Known Allergies  Current Meds  Medication Sig   acetaminophen (TYLENOL) 325 MG tablet Take 1-2 tablets (325-650 mg total) by mouth every 4 (four) hours as needed for mild pain.   apixaban (ELIQUIS) 5 MG TABS tablet Take 1 tablet (5 mg total) by mouth 2 (two) times daily.   atorvastatin (LIPITOR) 40 MG tablet Take 1 tablet (40 mg total) by mouth  daily.   metoprolol succinate (TOPROL-XL) 25 MG 24 hr tablet Take 1 tablet (25 mg total) by mouth daily.   pantoprazole (PROTONIX) 40 MG tablet Take 1 tablet (40 mg total) by mouth daily. For reflux--can use as needed if symptoms controlled.   traZODone (DESYREL) 50 MG tablet TAKE 1/2 TO 1 TABLET BY MOUTH AT [facility] NEEDED FOR SLEEP   Immunization History  Administered Date(s) Administered   Fluad Quad(high Dose 65+) 05/14/2019, 05/29/2020   Influenza Split 09/27/2011   Influenza, High Dose Seasonal PF 07/18/2017    Influenza,inj,Quad PF,6+ Mos 09/09/2015, 04/29/2016, 05/08/2018   PFIZER Comirnaty(Gray Top)Covid-19 Tri-Sucrose Vaccine 10/10/2019, 10/31/2019   PFIZER(Purple Top)SARS-COV-2 Vaccination 07/27/2020   Pneumococcal Conjugate-13 04/28/2015   Pneumococcal Polysaccharide-23 11/29/2013   Td 01/21/2003   Tdap 11/05/2012       Objective:   Physical Exam BP 106/80 (BP Location: Left Arm, Patient Position: Sitting, Cuff Size: Normal)   Pulse (!) 55   Temp (!) 97.3 F (36.3 C) (Oral)   Ht '5\' 4"'$  (1.626 m)   Wt 184 lb 6.4 oz (83.6 kg)   LMP 09/05/2008   SpO2 95%   BMI 31.65 kg/m  GENERAL: Overweight woman, no acute distress, HEAD: Normocephalic, atraumatic.  EYES: Pupils equal, round, reactive to light.  No scleral icterus.  MOUTH: Nose/mouth/throat not examined due to institutional masking requirements for COVID-19. NECK: Supple. No thyromegaly. Trachea midline. No JVD.  No adenopathy. PULMONARY: Good air entry bilaterally.  No adventitious sounds. CARDIOVASCULAR: S1 and S2.  Regular rate and rhythm with controlled ventricular response.  ABDOMEN: Obese otherwise benign. MUSCULOSKELETAL: No joint deformity, no clubbing, no edema.  NEUROLOGIC: Right-sided weakness poststroke.  No other focal deficits. SKIN: Intact,warm,dry. PSYCH: Flat affect, normal behavior.  Representative image from CT performed 11 November 2021 showing right upper lobe nodule measuring 1.0 x 0.9 cm:     Assessment & Plan:     ICD-10-CM   1. Lung nodules  R91.8 CT CHEST WO CONTRAST   Patient was noted to have a right upper lobe nodule This was on limited head and neck CT Multiple nodules identified on dedicated chest CT Follow-up CT 1 yr.    2. OSA (obstructive sleep apnea)  G47.33    Home sleep study 09/25/2021 severe OSA AHI 34.5 Titration study was recommended, patient has declined Dr. Mortimer Fries following this issue     Orders Placed This Encounter  Procedures   CT CHEST WO CONTRAST    Standing Status:    Future    Standing Expiration Date:   11/25/2022    Scheduling Instructions:     1y    Order Specific Question:   Preferred imaging location?    Answer:   [facility]   Order a CT chest in a year's time.  Will see the patient in follow-up at that time.  Renold Don, MD Advanced Bronchoscopy PCCM [facility]    *This note was dictated using voice recognition software/Dragon.  Despite best efforts to proofread, errors can occur which can change the meaning. Any transcriptional errors that result from this process are unintentional and may not be fully corrected at the time of dictation.

## 2021-11-24 NOTE — Patient Instructions (Signed)
Your lung scan shows that your nodules are stable. ? ?We reviewed this with you today. ? ?We will get a chest CT in a year and see you in follow-up after that at the nodule clinic. ? ? ?

## 2021-12-23 ENCOUNTER — Other Ambulatory Visit: Payer: Self-pay | Admitting: Family Medicine

## 2021-12-24 ENCOUNTER — Other Ambulatory Visit: Payer: Self-pay | Admitting: Family Medicine

## 2022-01-04 ENCOUNTER — Ambulatory Visit: Payer: Medicare PPO | Admitting: Family Medicine

## 2022-01-04 ENCOUNTER — Encounter: Payer: Self-pay | Admitting: Family Medicine

## 2022-01-04 ENCOUNTER — Telehealth: Payer: Self-pay | Admitting: Family Medicine

## 2022-01-04 ENCOUNTER — Ambulatory Visit (INDEPENDENT_AMBULATORY_CARE_PROVIDER_SITE_OTHER)
Admission: RE | Admit: 2022-01-04 | Discharge: 2022-01-04 | Disposition: A | Discharge status: Home / Self Care | Payer: Medicare PPO | Source: Ambulatory Visit | Attending: Family Medicine | Admitting: Family Medicine

## 2022-01-04 VITALS — BP 132/84 | HR 88 | Temp 97.3°F | Ht 64.0 in | Wt 184.1 lb

## 2022-01-04 DIAGNOSIS — M25511 Pain in right shoulder: Secondary | ICD-10-CM | POA: Insufficient documentation

## 2022-01-04 IMAGING — DX DG SHOULDER 2+V*R*
3 series · 3 of 3 positions shown · non-contrast
Comparison: None Available.

CLINICAL DATA: Pain in the anterior shoulder 3 weeks, no trauma.

EXAM:
RIGHT SHOULDER - 2+ VIEW

[shoulder (grashey view) ap]
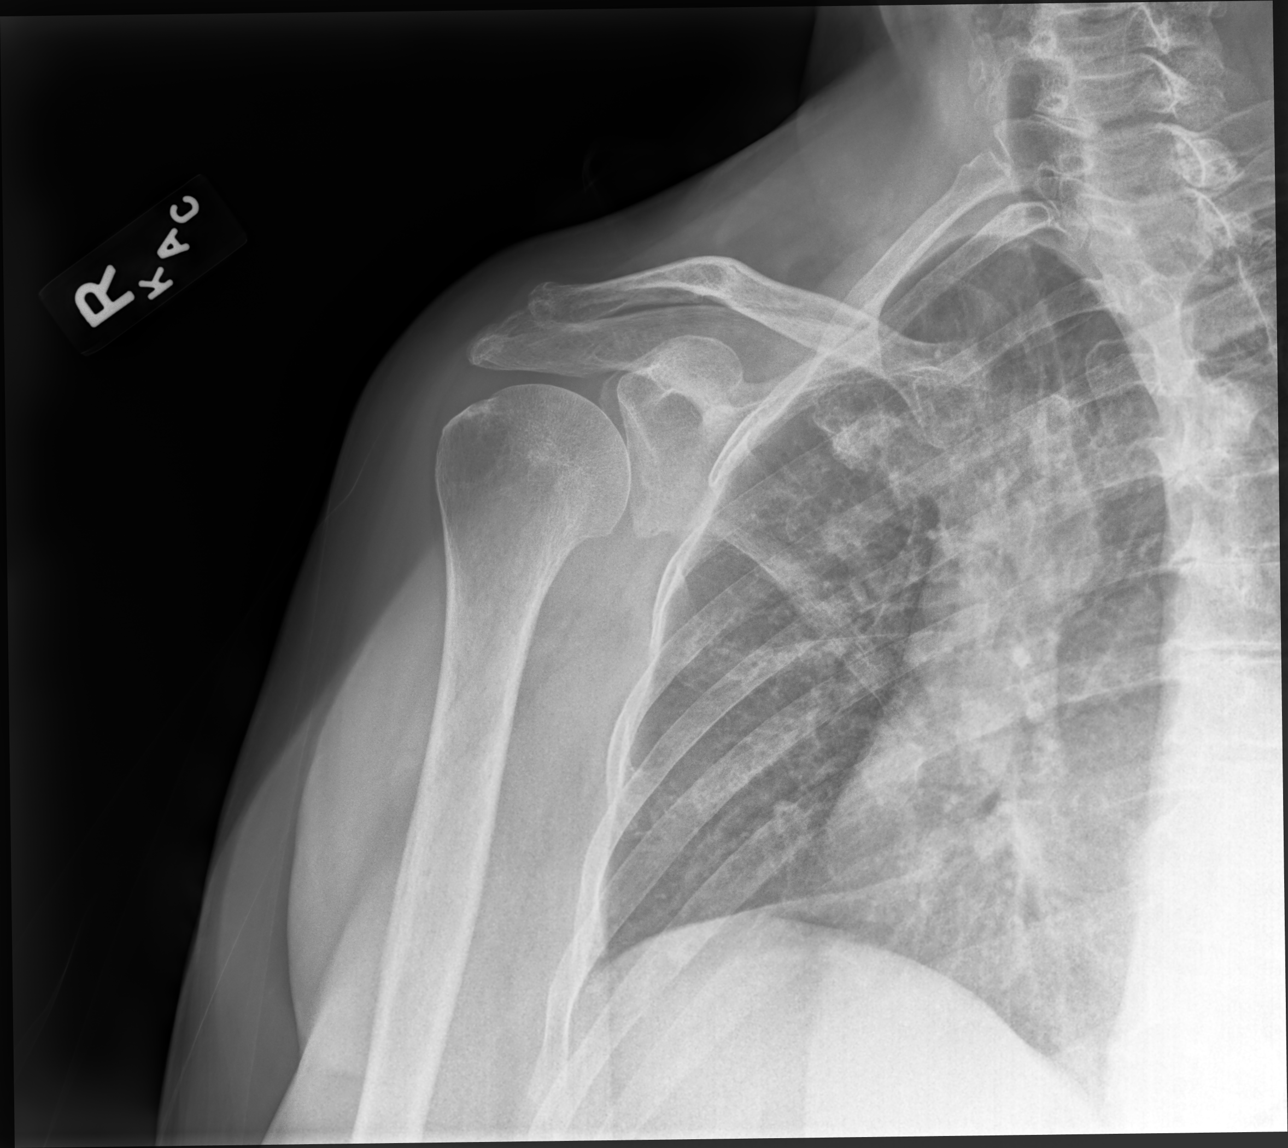

[shoulder (y view) lat]
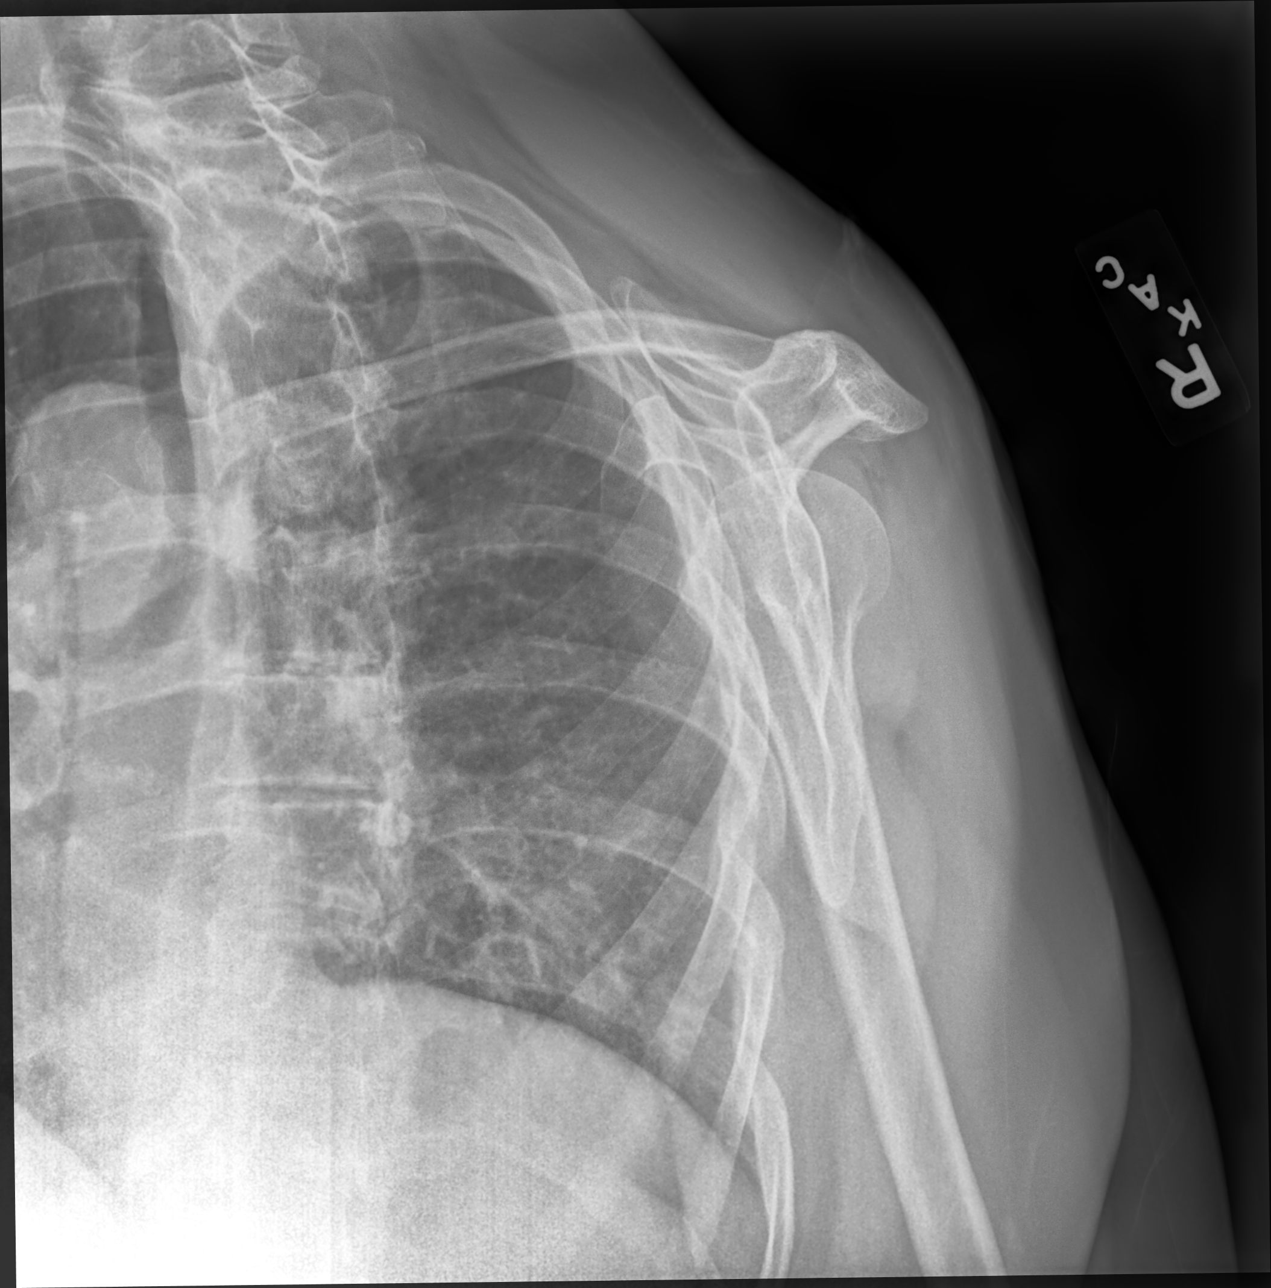

[shoulder axial]
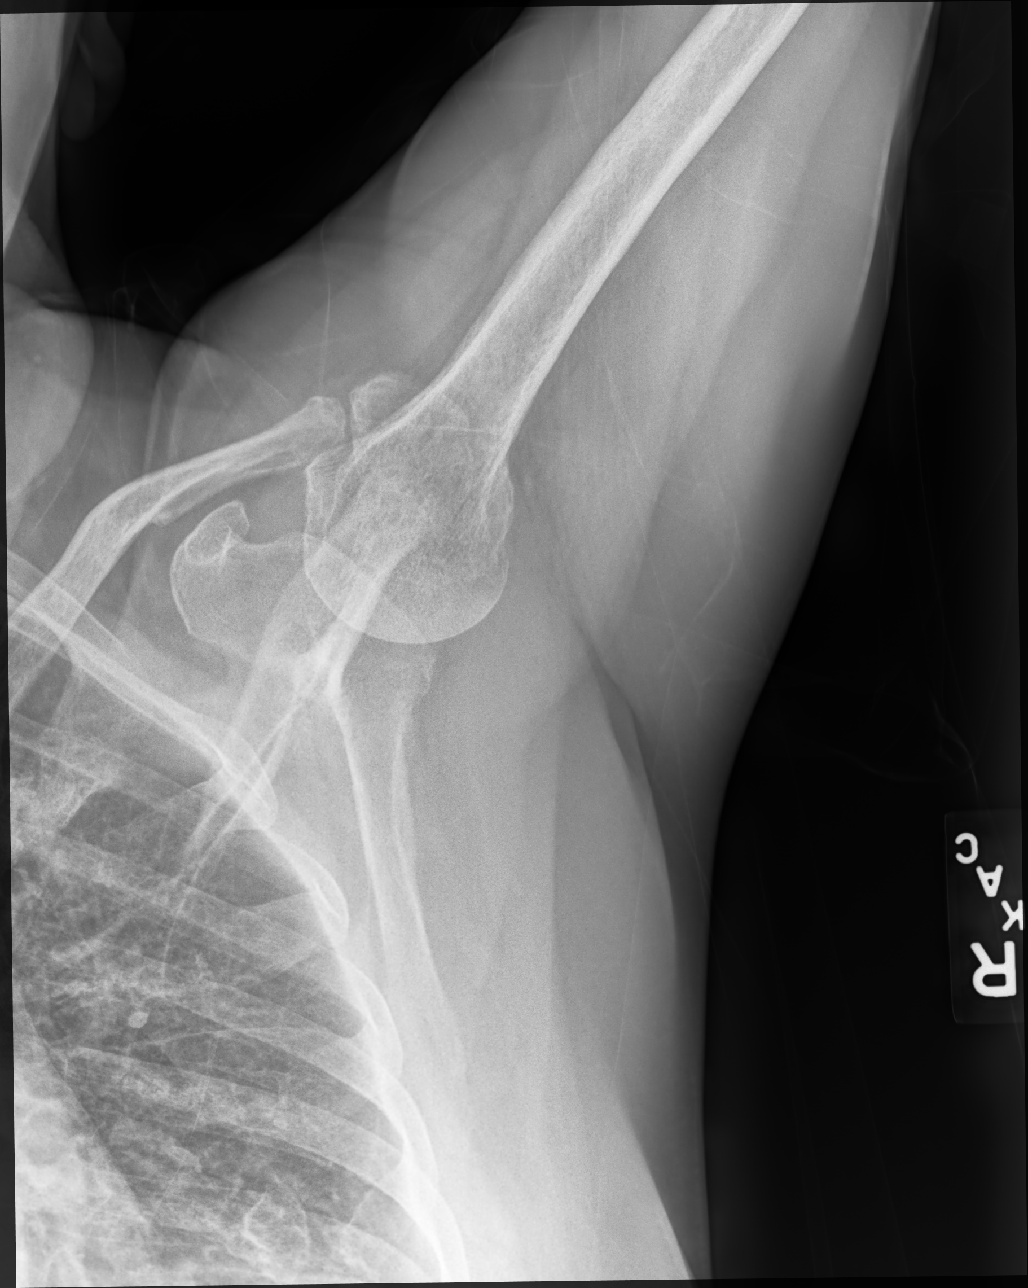

[3 of 3 positions shown; findings below may reference images not displayed]

FINDINGS: There is no evidence of fracture or dislocation. There is no
evidence of arthropathy or other suspicious focal bone abnormality.
Soft tissues are unremarkable.
IMPRESSION: No acute osseous abnormality

## 2022-01-04 NOTE — Assessment & Plan Note (Addendum)
No trauma or injury, for 3 weeks  ?Did have collar bone fracture in distant past  ? ?Some rotator cuff signs today but rom is not severely limited ?xr ordered ?rom exercises given/rev to prevent frozen shoulder  ?Adv use voltaren gel four times daily  ?Trial of ice for 10 minutes at a time  ? ?Pend rad rev  ?Consider PT or specialist  ?Addendum: nl xray report  , will offer sport med referral ?

## 2022-01-04 NOTE — Telephone Encounter (Signed)
Left VM requesting pt to call the office back 

## 2022-01-04 NOTE — Progress Notes (Signed)
? ?Subjective:  ? ? Patient ID: Deborah Carey, female    DOB: 18-Jul-1951, 71 y.o.   MRN: [id number] ? ?HPI ?Pt presents for pain in collar bone for 3 weeks/shoulder  ? ?Wt Readings from Last 3 Encounters:  ?01/04/22 184 lb 2 oz (83.5 kg)  ?11/24/21 184 lb 6.4 oz (83.6 kg)  ?10/07/21 190 lb 6.4 oz (86.4 kg)  ? ?31.60 kg/m? ? ?R shoulder pain for 3 weeks ?Mostly anterior  ?Hurts occ lateral  ?No trauma  ?No new activity  ? ?No redness or swelling  ?No crepitus  ?No pain in hand  ?No pain in neck  ? ? ?Yesterday -worse when she reached down into dryer ?Also if she reaches behind her  ?Comes and goes (has to trigger with something)  ? ? ? ?Otc ?Tried tylenol  ?Did not try ice  ?Used some heat / did help at the time  ?Cannot use nsaids-blood thinner  ? ?Uses voltaren gel  ? ?Remote history of R clavicle injury -- may have fractured it  ? ? ?Did see pulmonary in march for f/u of lung nodules ?CT chest was stable  ?Will plan next check in a year  ? ?CT CHEST WO CONTRAST (Accession [id number]) (Order [id number]) ?Imaging ?Date: 11/11/2021 Department: [facility] Released By: Melodie Bouillon Authorizing: Tyler Pita, MD  ? ?Exam Status ? ?Status  ?Final [99]  ? ?PACS Intelerad Image Link ? ? Show images for CT CHEST WO CONTRAST ? ?Study Result ? ?Narrative & Impression  ?CLINICAL DATA:  Follow-up lung nodules ?  ?EXAM: ?CT CHEST WITHOUT CONTRAST ?  ?TECHNIQUE: ?Multidetector CT imaging of the chest was performed following the ?standard protocol without IV contrast. ?  ?RADIATION DOSE REDUCTION: This exam was performed according to the ?departmental dose-optimization program which includes automated ?exposure control, adjustment of the mA and/or kV according to ?patient size and/or use of iterative reconstruction technique. ?  ?COMPARISON:  08/26/2021 ?  ?FINDINGS: ?Cardiovascular: Aortic atherosclerosis. Normal heart size. No ?pericardial effusion. ?  ?Mediastinum/Nodes: No  enlarged mediastinal, hilar, or axillary lymph ?nodes. Thyroid gland, trachea, and esophagus demonstrate no ?significant findings. ?  ?Lungs/Pleura: Dominant nodule of the medial right upper lobe is ?unchanged measuring 1.0 x 0.9 cm (series 3, image 35). Multiple ?additional smaller bilateral pulmonary nodules, most concentrated in ?the upper lobes are likewise unchanged, for example a 0.3 cm nodule ?of the left apex (series 3, image 27). No new nodules appreciated. ?No pleural effusion or pneumothorax. ?  ?Upper Abdomen: No acute abnormality. Large, calcified gallstone in ?the gallbladder fundus. ?  ?Musculoskeletal: No chest wall abnormality. No suspicious osseous ?lesions identified. Chronic, callused fractures of the posterior ?right ribs. ?  ?IMPRESSION: ?1. Multiple bilateral pulmonary nodules are unchanged, largest in ?the medial right upper lobe measuring 1.0 x 0.9 cm. No new nodules. ?Future CT at 18-24 months (from initial baseline scan) is considered ?optional for low-risk patients, but is recommended for high-risk ?patients. This recommendation follows the consensus statement: ?Guidelines for Management of Incidental Pulmonary Nodules Detected ?on CT Images: From the Fleischner Society 2017; Radiology 2017; ?[id number]. ?2. Cholelithiasis. ?  ?Aortic Atherosclerosis (ICD10-I70.0). ?  ? ?Left breast US in [place] ?IMPRESSION: ?1. Interval decrease in size of 2 LEFT breast masses to their ?baseline configuration. This is consistent with a reactive etiology. ?2. No mammographic evidence of malignancy bilaterally. ?  ?RECOMMENDATION: ?Screening mammogram in one year.(Code:[id number]-B-01Y) ? ? ?Last thyroid US unchanged in 2019 , no  f/u recommended ?Lab Results  ?Component Value Date  ? TSH 4.101 05/26/2021  ? ?Patient Active Problem List  ? Diagnosis Date Noted  ? Right shoulder pain 01/04/2022  ? Chronic ischemic left MCA stroke 06/18/2021  ? Cholelithiasis without cholecystitis 06/07/2021  ? Nodule of apex of right  lung 06/07/2021  ? History of CVA (cerebrovascular accident) 05/25/2021  ? Atrial fibrillation ([place])   ? Mass of breast, left 11/13/2020  ? Colon cancer screening 05/14/2019  ? Vitamin D deficiency 05/08/2018  ? [place] 06/20/2017  ? H/O fracture of fibula 01/21/2014  ? Osteopenia 01/07/2014  ? Family history of osteoporosis 11/29/2013  ? Estrogen deficiency 11/29/2013  ? Hyperlipidemia 11/29/2013  ? Encounter for Medicare annual wellness exam 10/29/2013  ? Irregular heartbeat 10/07/2013  ? Left thyroid nodule 09/26/2013  ? Enlarged thyroid 09/16/2013  ? Primary osteoarthritis of both knees 11/05/2012  ? Other screening mammogram 09/27/2011  ? Routine general medical examination at a health care facility 09/19/2011  ? Class 1 obesity due to excess calories with serious comorbidity and body mass index (BMI) of 34.0 to 34.9 in adult 06/06/2008  ? Prediabetes 06/06/2008  ? Anxiety state 04/17/2007  ? ?Past Medical History:  ?Diagnosis Date  ? Allergy   ? allergic rhinitis  ? Anxiety   ? Cataract   ? removed both eyes   ? Infertility, female   ? Osteoarthritis of both knees   ? OA   ? PONV (postoperative nausea and vomiting)   ? Post-menopausal bleeding   ? neg endo bx.  ? ?Past Surgical History:  ?Procedure Laterality Date  ? cataract surgery  2011  ? CESAREAN SECTION    ? times 2  ? COLONOSCOPY  2010  ? hems   ? ENDOMETRIAL BIOPSY    ? neg for CA cells  ? FACIAL COSMETIC SURGERY  2006  ? face lift  ? FOOT SURGERY  1998  ? rt heel spur removal  ? IR CT HEAD LTD  05/25/2021  ? IR PERCUTANEOUS ART THROMBECTOMY/INFUSION INTRACRANIAL INC DIAG ANGIO  05/25/2021  ? IR US GUIDE VASC ACCESS RIGHT  05/25/2021  ? RADIOLOGY WITH ANESTHESIA N/A 05/25/2021  ? Procedure: IR WITH ANESTHESIA;  Surgeon: Aletta Edouard, MD;  Location: [facility];  Service: Radiology;  Laterality: N/A;  ? REFRACTIVE SURGERY  08/1999  ? ?Social History  ? ?Tobacco Use  ? Smoking status: Never  ? Smokeless tobacco: Never  ?Vaping Use  ? Vaping Use: Never used   ?Substance Use Topics  ? Alcohol use: No  ?  Alcohol/week: 0.0 standard drinks  ? Drug use: No  ? ?Family History  ?Problem Relation Age of Onset  ? COPD Brother   ? Stroke Mother   ? Hypertension Mother   ? Osteoporosis Mother   ? Alzheimer's disease Father   ? Hypertension Father   ? Osteoporosis Father   ? Depression Sister   ? Hypertension Sister   ? Osteoporosis Sister   ? Thyroid disease Sister   ? Hypothyroidism Sister   ? Parkinson's disease Sister   ? Osteoporosis Sister   ? Hypothyroidism Sister   ? Breast cancer Neg Hx   ? Colon polyps Neg Hx   ? Colon cancer Neg Hx   ? Esophageal cancer Neg Hx   ? Rectal cancer Neg Hx   ? Stomach cancer Neg Hx   ? ?No Known Allergies ?Current Outpatient Medications on File Prior to Visit  ?Medication Sig Dispense Refill  ? acetaminophen (TYLENOL)  325 MG tablet Take 1-2 tablets (325-650 mg total) by mouth every 4 (four) hours as needed for mild pain.    ? atorvastatin (LIPITOR) 40 MG tablet TAKE ONE TABLET BY MOUTH DAILY 90 tablet 1  ? ELIQUIS 5 MG TABS tablet TAKE ONE TABLET BY MOUTH TWICE DAILY 180 tablet 1  ? metoprolol succinate (TOPROL-XL) 25 MG 24 hr tablet TAKE ONE TABLET BY MOUTH DAILY 90 tablet 1  ? pantoprazole (PROTONIX) 40 MG tablet TAKE 1 TABLET BY MOUTH DAILY FOR REFLUX-CAN USE AS NEEDED IF SYMPTOMS CONTROLLED 90 tablet 0  ? traZODone (DESYREL) 50 MG tablet TAKE 1/2 TO 1 TABLET BY MOUTH AT BEDTIMEAS NEEDED FOR SLEEP 30 tablet 5  ? ?No current facility-administered medications on file prior to visit.  ?  ? ? ?Review of Systems  ?Constitutional:  Negative for activity change, appetite change, fatigue, fever and unexpected weight change.  ?HENT:  Negative for congestion, ear pain, rhinorrhea, sinus pressure and sore throat.   ?Eyes:  Negative for pain, redness and visual disturbance.  ?Respiratory:  Negative for cough, shortness of breath and wheezing.   ?Cardiovascular:  Negative for chest pain and palpitations.  ?Gastrointestinal:  Negative for abdominal  pain, blood in stool, constipation and diarrhea.  ?Endocrine: Negative for polydipsia and polyuria.  ?Genitourinary:  Negative for dysuria, frequency and urgency.  ?Musculoskeletal:  Negative for arthralgias

## 2022-01-04 NOTE — Assessment & Plan Note (Signed)
Last thyroid US normal 2019  ?

## 2022-01-04 NOTE — Telephone Encounter (Signed)
Pt notified of xray results and Dr. Marliss Coots comments. Pt said she will try the at home recommendations 1st and if sxs don't improve then she will call back and schedule an appt with Dr. Lorelei Pont  ?

## 2022-01-04 NOTE — Patient Instructions (Signed)
Use voltaren gel four times daily  ? ?Try ice for 10 minutes when you can  ?Heat is ok also if it helps  ? ?Do the passive range of motion exercises  ? ?Xray today  ? ?We will make a plan based on xray result  ? ? ? ? ?

## 2022-01-04 NOTE — Telephone Encounter (Signed)
Shoulder xray is normal  ?Please let he know  ? ?I want to schedule an appt with Dr Lorelei Pont ([facility]) for further eval  ? ?Do continue voltaren gel ?Try ice and passive rom exercises in the meantime  ? ? ?

## 2022-01-17 ENCOUNTER — Telehealth: Payer: Self-pay | Admitting: Family Medicine

## 2022-01-17 NOTE — Telephone Encounter (Signed)
Left message for patient to call back and schedule Medicare Annual Wellness Visit (AWV) to be completed by video or phone. ? ? ? ?Last AWV: 05/20/2020 ? ? ? ?Please schedule at anytime with  ?[facility]    ? ? ? ?45 minute appointment ? ? ? ?Any questions, please contact me at [phone]  ?

## 2022-02-16 DIAGNOSIS — M25511 Pain in right shoulder: Secondary | ICD-10-CM | POA: Diagnosis not present

## 2022-03-23 ENCOUNTER — Other Ambulatory Visit: Payer: Self-pay | Admitting: Family Medicine

## 2022-03-26 ENCOUNTER — Other Ambulatory Visit: Payer: Self-pay | Admitting: Family Medicine

## 2022-04-07 ENCOUNTER — Encounter: Payer: Medicare PPO | Admitting: Family Medicine

## 2022-04-08 ENCOUNTER — Encounter: Payer: Medicare PPO | Admitting: Family Medicine

## 2022-05-02 ENCOUNTER — Encounter: Payer: Medicare PPO | Admitting: Family Medicine

## 2022-05-03 ENCOUNTER — Ambulatory Visit (INDEPENDENT_AMBULATORY_CARE_PROVIDER_SITE_OTHER): Payer: Medicare PPO | Admitting: Family Medicine

## 2022-05-03 VITALS — Ht 64.0 in | Wt 182.0 lb

## 2022-05-03 DIAGNOSIS — G479 Sleep disorder, unspecified: Secondary | ICD-10-CM | POA: Diagnosis not present

## 2022-05-03 DIAGNOSIS — Z0000 Encounter for general adult medical examination without abnormal findings: Secondary | ICD-10-CM | POA: Diagnosis not present

## 2022-05-03 DIAGNOSIS — R739 Hyperglycemia, unspecified: Secondary | ICD-10-CM | POA: Diagnosis not present

## 2022-05-03 NOTE — Patient Instructions (Signed)
CHECKLIST FOR A HEALTHY LIFE:  -Eat a healthy whole foods based diet, get regular physical activity, manage stress and engage in social connections - see below for specific suggestions and more information.  -Vaccines due:  -shingles  -flu  -covid booster  -Tests and screenings due:  -labs with your doctor tomorrow  -ask your doctor about follow up on hgba1c, Vit D level and Bone density  -See a dentist at least yearly  -Get your eyes checked and then per your eye specialist's recommendations  -Other issues addressed today:  -Sleep: see below -diet (more info below)- aiming for 5-7 servings of colorful veggies and small amounts of low sugar fruit (such as berries) - avoid any foods with added sweetener/sugar -aim for whole grains (avoid white rice, white bread, oatmeal or pasta that is not whole grain ) - make sure it says "whole" in the first listed ingredient on the packaging -try to get at least 150 minutes of moderate exercise a week  -Follow up: -with your doctor as schedule -yearly for annual wellness visit -please feel free to schedule a telephone or video visit with me any time you want more help with treating the blood sugar with diet - I would love to help! Bring a diet journal if you can to any such visit. Also bring a list of healthy foods that you like to eat.      FOOD - THE FUEL FOR A HAPPY HEALTHY LIFE: Food Prescription for you: -eat real food: lots of colorful vegetables (half the plate), small amounts of fresh fruits, fish, nuts, seeds, healthy oils (such as olive oil, avocado oil or organic grass fed unsalted butter), small portions of meat and small portions of whole grains -drink water -try to avoid fast food and  pre-packaged foods  -try to avoid foods that contain any ingredients with names you do not recognize  -try to avoid sugar/sweets (except for the natural sugar that occurs in fresh fruit) -try to avoid sweet drinks -try to avoid white rice, white bread, pasta (unless whole grain), white or yellow potatoes  MOVE - the key to keeping your body moving and working best: Exercise Prescription for you: -gradually increase intentional physical activity -move and stretch your body, legs, feet and arms when sitting for long periods -try to get at least 20 minutes of sustained activity or two 10 minute episodes of sustained activity every day at minimum  STRESS MANAGEMENT - so important for health and well being -try meditating, or just sitting quietly with deep breathing while intentionally relaxing all parts of your body for 5 minutes daily  SOCIAL CONNECTIONS: -options in [place] if you wish to engage in more social activities: -Check out the [place] 50+ section on the [place] [employer] (hiking clubs, book clubs, cards and games, chess, exercise classes, aquatic classes and much more) - see the website for details: https://www.[place].gov/departments/parks-recreation/active-adults50 -[place] (a variety of indoor and outdoor inperson activities for adults). (531)781-2801. [address]. -Virtual Online Classes (a variety of topics): see seniorplanet.org or call [phone] -consider volunteering at a school, hospice center, church, senior center or elsewhere     [place]:  '[]'$  Schedule sleep counseling(cognitive behavioral therapy).   [place] is a good option.   Call for appointment: [phone]  '[]'$  Exercise 30 minutes daily. Avoid caffeine and alcohol - particularly in the evenings.  '[]'$  Go to bed and wake up at the  same time everyday.  '[]'$  Keep bedroom cool, dark and  quiet  '[]'$  Reserve bed for sleep - do not read, watch TV, look at phone or device, etc., in bed.  '[]'$  If you toss and turn for more then 10-15 minutes, get out of bed and list or journal thoughts or do quiet activity (not screen-time, no tv, phone, computer) then go back to bed. Can read, journal, try mindfulness or meditation, do low key housework, hobby, etc.  Repeat as needed. Try not to worry about when you will eventually fall asleep.  '[]'$  Some people find that a half dose of benadryl, melatonin, tylenol pm or unisom on a few nights per week is helpful initially for a few weeks.  '[]'$  Seek help for any depression or anxiety.  '[]'$ Prescription strength sleep medications should only be used in severe cases of insomnia if other measures fail and should be used sparingly.  I hope you are feeling better soon! Follow up with your doctor in 3-4 weeks or sooner if your symptoms worsen or new concerns arise.

## 2022-05-03 NOTE — Progress Notes (Signed)
  MEDICARE WELLNESS VISIT PATIENT CHECK-IN and HEALTH RISK ASSESSMENT QUESTIONNAIRE:  -completed by phone/video for upcoming Medicare Preventive Visit  Pre-Visit Check-in: 1)Vitals (height, wt, BP, etc) - record in vitals section for visit on day of visit 2)Review and Update Medications, Allergies PMH, Surgeries, Social history in Epic 3)Hospitalizations in the last year with date/reason? 9/20-27/ 2022: for stroke then PT  4)Review and Update Care Team (patient's specialists) in Epic 5) Complete PHQ9 in Epic  6) Complete Fall Screening in Epic 7)Review all Health Maintenance Due and order under PCP if not done.  8)Medicare Wellness Questionnaire: Answer theses question about your habits: Do you drink alcohol? no How many drinks do you have a day?n/a Have you ever smoked?no Have you stopped smoking and date if applicable? N/a  How many packs a day do you smoke? N/a Do you use smokeless tobacco?no Do you use illicit drugs?no Do you exercises? Yes If so, what type and how many days/minutes per week? 20 mins per day. 5 days of the week- walking Are you sexually active? No Number of partners? What did you eat for breakfast today (or yesterday)?poptarts  Typical breakfast: usually skipped  What did you eat for lunch today (or yesterday)? Chicken. Mac n cheese, lime bean  Typical lunch: meat, vegetable  What did you eat for diner today (or yesterday)? Typical dinner: "same thing, meat or vegetable" Typical snacks:cookie, apple, grape, cracker What beverages do you drink besides water: tea  Answer theses question about you: Can you perform most household chores? yes Do you find it hard to follow a conversation in a noisy room?no  Do you find it hard to understand a speaker at church or in a meeting?no Do you often ask people to speak up or repeat themselves?no Do you experience ringing in your ears? no Do you have difficulty understanding a soft or whispered voice? no Do you feel that  you have a problem with memory? Yes can't think of words sometimes  Do you often misplace items? sometimes Do you balance your checkbook and or bank acounts? Yes  Do you feel safe at home?yes Last dentist visit? Last month Do you need assistance with any of the following: NO ASSISTANCE NEEDED TO ALL  Driving?  Feeding yourself?  Getting from bed to chair?  Getting to the toilet?  Bathing or showering?  Dressing yourself?  Managing money?  Climbing a flight of stairs?  Preparing meals?  Do you have Advanced Directives in place (Living Will, Healthcare Power or Attorney)? Yes, Mike(spouse) and children   Last eye Exam and location? Yes every year. Already done this year. Unable to recall the date. At [facility]   Do you currently use prescribed or non-prescribed narcotic or opioid pain medications? NO  Do you have a history or close family history of breast, ovarian, tubal or peritoneal cancer or a family member with BRCA 1/2 (breast cancer susceptibility 1 and 2) gene mutations? No  Nurse/Assistant Credentials/time stamp: Elayah Klooster M./CMA/ 953 am.

## 2022-05-03 NOTE — Progress Notes (Signed)
MEDICARE WELLNESS VISIT PATIENT CHECK-IN and HEALTH RISK ASSESSMENT QUESTIONNAIRE:   -completed by phone/video for upcoming Medicare Preventive Visit   Pre-Visit Check-in: 1)Vitals (height, wt, BP, etc) - record in vitals section for visit on day of visit 2)Review and Update Medications, Allergies PMH, Surgeries, Social history in Epic 3)Hospitalizations in the last year with date/reason? 9/20-27/ 2022: for stroke then PT - is doing well now, has some mild weakness on the L side compared to the R - is able eat, write etc with that hand. 4)Review and Update Care Team (patient's specialists) in Epic 5) Complete PHQ9 in Epic  6) Complete Fall Screening in Epic 7)Review all Health Maintenance Due and order under PCP if not done.   8)Medicare Wellness Questionnaire: Answer theses question about your habits: Do you drink alcohol? no How many drinks do you have a day?n/a Have you ever smoked?no Have you stopped smoking and date if applicable? N/a  How many packs a day do you smoke? N/a Do you use smokeless tobacco?no Do you use illicit drugs?no Do you exercises? Yes If so, what type and how many days/minutes per week? 20 mins per day. 5 days of the week- walking. Are you sexually active? No Number of partners? What did you eat for breakfast today (or yesterday)?poptarts  Typical breakfast: usually skipped  What did you eat for lunch today (or yesterday)? Chicken. Mac n cheese, lime bean  Typical lunch: meat, vegetable  What did you eat for diner today (or yesterday)? Typical dinner: "same thing, meat or vegetable" Typical snacks:cookie, apple, grape, cracker What beverages do you drink besides water: tea   Answer theses question about you: Can you perform most household chores? yes Do you find it hard to follow a conversation in a noisy room?no  Do you find it hard to understand a speaker at church or in a meeting?no Do you often ask people to speak up or repeat themselves?no Do you  experience ringing in your ears? no Do you have difficulty understanding a soft or whispered voice? no Do you feel that you have a problem with memory? Yes can't think of words sometimes  Do you often misplace items? sometimes Do you balance your checkbook and or bank acounts? Yes  Do you feel safe at home?yes Last dentist visit? Last month Do you need assistance with any of the following: NO TO ALL             Driving?             Feeding yourself?             Getting from bed to chair?             Getting to the toilet?             Bathing or showering?             Dressing yourself?             Managing money?             Climbing a flight of stairs?             Preparing meals?   Do you have Advanced Directives in place (Living Will, Healthcare Power or Attorney)? Yes, Mike(spouse) and children     Last eye Exam and location? Yes every year. Already done this year. Unable to recall the date. At [facility]     Do you currently use prescribed or non-prescribed narcotic or opioid  pain medications? NO   Do you have a history or close family history of breast, ovarian, tubal or peritoneal cancer or a family member with BRCA 1/2 (breast cancer susceptibility 1 and 2) gene mutations? No   Nurse/Assistant Credentials/time stamp: Karpuih M./CMA/ 953 am.    ----------------------------------------------------------------------------------------------------------------------------------------------------------------------------------------------------------------------   MEDICARE ANNUAL PREVENTIVE VISIT WITH PROVIDER: (Welcome to [employer], initial annual wellness or annual wellness exam)  Virtual Visit via Video Note  I connected with Kimmi  on 05/03/2022 via telephone.  Location patient: home Location provider:work or home office Persons participating in the virtual visit: patient, provider Patient provided verbal consent to discuss health information via phone today.   Concerns and/or follow up today:  She is interested in making changes to her diet to help control her blood sugars. She is interested in blood sugar levels. She is taking Vit D for her bones and is interested in the levels.  She has inperson visit with her PCP tomorrow and plans to have labs then.  She has poor sleep (reason for positive phq9) and does not feel that trazadone helps much. She watches tv to help fall asleep but tosses and turns and worries about not being able to sleep. Has issues with both falling asleep initially and maintaining sleep. Denies depression.  See HM section in Epic for other details of completed HM.    ROS: negative for report of fevers, unintentional weight loss, vision changes, vision loss, hearing loss or change, chest pain, sob, hemoptysis, melena, hematochezia, hematuria, genital discharge or lesions, falls, bleeding or bruising, loc, thoughts of suicide or self harm, memory loss  Patient-completed extensive health risk assessment - reviewed and discussed with the patient: See Health Risk Assessment completed with patient prior to the visit either above or in recent phone note. This was reviewed in detailed with the patient today and appropriate recommendations, orders and referrals were placed as needed per Summary below and patient instructions.   Review of Medical History: -PMH, PSH, Family History and current specialty and care providers reviewed and updated and listed below   Patient Care Team: Tower, Wynelle Fanny, MD as PCP - General Kate Sable, MD as PCP - Cardiology (Cardiology) Regina Eck, CNM as Referring Physician (Certified Nurse Midwife) Conception Chancy, DDS as Referring Physician (Dentistry)   Past Medical History:  Diagnosis Date   Allergy    allergic rhinitis   Anxiety    Cataract    removed both eyes    Infertility, female    Osteoarthritis of both knees    OA    PONV (postoperative nausea and vomiting)    Post-menopausal  bleeding    neg endo bx.    Past Surgical History:  Procedure Laterality Date   cataract surgery  2011   CESAREAN SECTION     times 2   COLONOSCOPY  2010   hems    ENDOMETRIAL BIOPSY     neg for CA cells   FACIAL COSMETIC SURGERY  2006   face lift   FOOT SURGERY  1998   rt heel spur removal   IR CT HEAD LTD  05/25/2021   IR PERCUTANEOUS ART THROMBECTOMY/INFUSION INTRACRANIAL INC DIAG ANGIO  05/25/2021   IR US GUIDE VASC ACCESS RIGHT  05/25/2021   RADIOLOGY WITH ANESTHESIA N/A 05/25/2021   Procedure: IR WITH ANESTHESIA;  Surgeon: Aletta Edouard, MD;  Location: [facility];  Service: Radiology;  Laterality: N/A;   REFRACTIVE SURGERY  08/1999    Social History   Socioeconomic History  Marital status: Married    Spouse name: Not on file   Number of children: Not on file   Years of education: Not on file   Highest education level: Not on file  Occupational History   Occupation: retired  Tobacco Use   Smoking status: Never   Smokeless tobacco: Never  Vaping Use   Vaping Use: Never used  Substance and Sexual Activity   Alcohol use: No    Alcohol/week: 0.0 standard drinks of alcohol   Drug use: No   Sexual activity: Yes    Partners: Male    Birth control/protection: Post-menopausal  Other Topics Concern   Not on file  Social History Narrative   Not on file   Social Determinants of Health   Financial Resource Strain: Low Risk  (05/20/2020)   Overall Financial Resource Strain (CARDIA)    Difficulty of Paying Living Expenses: Not hard at all  Food Insecurity: No Food Insecurity (05/20/2020)   Hunger Vital Sign    Worried About Running Out of Food in the Last Year: Never true    [place] in the Last Year: Never true  Transportation Needs: No Transportation Needs (05/20/2020)   PRAPARE - Hydrologist (Medical): No    Lack of Transportation (Non-Medical): No  Physical Activity: Inactive (05/20/2020)   Exercise Vital Sign    Days of Exercise  per Week: 0 days    Minutes of Exercise per Session: 0 min  Stress: No Stress Concern Present (05/20/2020)   [place]    Feeling of Stress : Not at all  Social Connections: Not on file  Intimate Partner Violence: Not At Risk (05/20/2020)   Humiliation, Afraid, Rape, and Kick questionnaire    Fear of Current or Ex-Partner: No    Emotionally Abused: No    Physically Abused: No    Sexually Abused: No    Family History  Problem Relation Age of Onset   COPD Brother    Stroke Mother    Hypertension Mother    Osteoporosis Mother    Alzheimer's disease Father    Hypertension Father    Osteoporosis Father    Depression Sister    Hypertension Sister    Osteoporosis Sister    Thyroid disease Sister    Hypothyroidism Sister    Parkinson's disease Sister    Osteoporosis Sister    Hypothyroidism Sister    Breast cancer Neg Hx    Colon polyps Neg Hx    Colon cancer Neg Hx    Esophageal cancer Neg Hx    Rectal cancer Neg Hx    Stomach cancer Neg Hx     Current Outpatient Medications on File Prior to Visit  Medication Sig Dispense Refill   acetaminophen (TYLENOL) 325 MG tablet Take 1-2 tablets (325-650 mg total) by mouth every 4 (four) hours as needed for mild pain.     albuterol (VENTOLIN HFA) 108 (90 Base) MCG/ACT inhaler INHALE 2 PUFFS INTO THE LUNGS EVERY 4 (FOUR) HOURS AS NEEDED.     atorvastatin (LIPITOR) 40 MG tablet TAKE ONE TABLET BY MOUTH DAILY 90 tablet 1   ELIQUIS 5 MG TABS tablet TAKE ONE TABLET BY MOUTH TWICE DAILY 180 tablet 1   metoprolol succinate (TOPROL-XL) 25 MG 24 hr tablet TAKE ONE TABLET BY MOUTH DAILY 90 tablet 1   pantoprazole (PROTONIX) 40 MG tablet TAKE 1 TABLET BY MOUTH DAILY FOR REFLUX-CAN USE AS NEEDED IF  SYMPTOMS CONTROLLED 90 tablet 0   traZODone (DESYREL) 50 MG tablet TAKE 1/2 TO 1 TABLET BY MOUTH AT BEDTIMEAS NEEDED FOR SLEEP 30 tablet 5   No current facility-administered medications on  file prior to visit.    No Known Allergies     Physical Exam There were no vitals filed for this visit. Estimated body mass index is 31.24 kg/m as calculated from the following:   Height as of this encounter: _0  (1.626 m).   Weight as of this encounter: 182 lb (82.6 kg).  EKG (optional): deferred due to virtual visit  GENERAL: sounds alert and oriented on the phone, no audible sounds of distress  LUNGS: no audible sounds of resp distress.  PSYCH/NEURO: pleasant and cooperative, speech and cognition grossly intact  [place] Office Visit from 05/03/2022 in [facility] [place]  PHQ-9 Total Score 6           05/03/2022    9:37 AM 06/17/2021    9:00 AM 05/20/2020    2:47 PM 05/10/2019    3:38 PM 05/03/2018   11:00 AM  Depression screen PHQ 2/9  Decreased Interest 1 0 0 1 0  Down, Depressed, Hopeless 1 0 1 3 0  PHQ - 2 Score 2 0 1 4 0  Altered sleeping _1 0  Tired, decreased energy 1  0 0 0  Change in appetite 0  0 0 0  Feeling bad or failure about yourself  0  0 0 0  Trouble concentrating 0  0 0 0  Moving slowly or fidgety/restless 0  0 0 0  Suicidal thoughts 0  0 0 0  PHQ-9 Score _2 0  Difficult doing work/chores Extremely dIfficult  Not difficult at all Not difficult at all Not difficult at all      06/06/2021    8:49 AM 06/06/2021    8:42 PM 06/07/2021    8:37 AM 06/17/2021    9:00 AM 05/03/2022    9:40 AM  Fall Risk  Falls in the past year?    0 1  Was there an injury with Fall?    0 0  Fall Risk Category Calculator    0 2  Fall Risk Category    Low Moderate  Patient Fall Risk Level High fall risk High fall risk Moderate fall risk Low fall risk Moderate fall risk  Patient at Risk for Falls Due to     No Fall Risks;Impaired balance/gait  Fall risk Follow up     Falls evaluation completed    Does not feel has depression.  After stroke had issues with fall last year - now much better and is much more cautious with balance, and not fall  since october of last year. SUMMARY AND PLAN:  Encounter for Medicare annual wellness exam  Sleep disorder  Elevated blood sugar   The following health maintenance/preventive care measures were recommended/discussed and the patient was referred if needed and if the patient agreeable:   Vaccines - discussed shingles, covid and flu vaccines. Sees PCP tomorrow and can receive some vaccines there and covid booster at pharmacy.    Mammogram - utd  Colorectal cancer screening utd  Osteoporosis screening if applicable - reviewed last report with patient, reviewed vit d and calcium intake - taking bother, she is getting weight bearing exercise - suggested increasing slowly to 150 min moderate exercise per week and adding small weights (1-2 lbs). Advised to discuss vit d  and dex recheck with PCP tomorrow.   Screening for glaucoma - see optho  Diabetes screening tests - she plans to recheck tomorrow with PCP  Education and counseling on the following was provided based on the above review of health and a plan/checklist for the patient, along with additional information discussed, was provided for the patient in the patient instructions :   -Provided counseling and plan for increased risk of falling if applicable per above screening. Currently improved significantly and feels strength is improving.  -Advised and counseled on maintaining healthy weight and healthy lifestyle - including the importance of a health diet, regular physical activity, social connections and stress management. -Advised and counseled on a whole foods, low in added sugar and simple starch, plant predominant  healthy diet and regular exercise at length. She plans to get labs with PCP tomorrow and try to substituet healthier foods in place of processed/sweetened foods. Also advised a diet journal and trying for 5-7 servings of lower sugar whole foods veggies/fruits per day.  A summary of a healthy diet was provided in the  Patient Instructions.Offered further assistance with lifestyle medicine follow up if needed and/or could refer to dietician.  -Advised yearly dental visits at minimum and regular eye exams -counseled on sleep hygiene at length. Discussed option of counseling for further help with sleep and other pharmacological options for sleep. She plans to try adjusting sleep hygiene for now.    Follow up: see patient instructions     There are no Patient Instructions on file for this visit.  Lucretia Kern, DO

## 2022-05-04 ENCOUNTER — Encounter: Payer: Self-pay | Admitting: Family Medicine

## 2022-05-04 ENCOUNTER — Ambulatory Visit (INDEPENDENT_AMBULATORY_CARE_PROVIDER_SITE_OTHER): Payer: Medicare PPO | Admitting: Family Medicine

## 2022-05-04 VITALS — BP 116/70 | HR 64 | Temp 97.4°F | Ht 63.0 in | Wt 180.0 lb

## 2022-05-04 DIAGNOSIS — Z0000 Encounter for general adult medical examination without abnormal findings: Secondary | ICD-10-CM | POA: Diagnosis not present

## 2022-05-04 DIAGNOSIS — R7303 Prediabetes: Secondary | ICD-10-CM | POA: Diagnosis not present

## 2022-05-04 DIAGNOSIS — L814 Other melanin hyperpigmentation: Secondary | ICD-10-CM

## 2022-05-04 DIAGNOSIS — E7800 Pure hypercholesterolemia, unspecified: Secondary | ICD-10-CM

## 2022-05-04 DIAGNOSIS — E559 Vitamin D deficiency, unspecified: Secondary | ICD-10-CM

## 2022-05-04 DIAGNOSIS — E049 Nontoxic goiter, unspecified: Secondary | ICD-10-CM | POA: Diagnosis not present

## 2022-05-04 DIAGNOSIS — M8589 Other specified disorders of bone density and structure, multiple sites: Secondary | ICD-10-CM | POA: Diagnosis not present

## 2022-05-04 DIAGNOSIS — R911 Solitary pulmonary nodule: Secondary | ICD-10-CM

## 2022-05-04 DIAGNOSIS — K8020 Calculus of gallbladder without cholecystitis without obstruction: Secondary | ICD-10-CM

## 2022-05-04 DIAGNOSIS — E041 Nontoxic single thyroid nodule: Secondary | ICD-10-CM

## 2022-05-04 DIAGNOSIS — I6930 Unspecified sequelae of cerebral infarction: Secondary | ICD-10-CM

## 2022-05-04 DIAGNOSIS — I4819 Other persistent atrial fibrillation: Secondary | ICD-10-CM | POA: Diagnosis not present

## 2022-05-04 DIAGNOSIS — Z79899 Other long term (current) drug therapy: Secondary | ICD-10-CM | POA: Diagnosis not present

## 2022-05-04 DIAGNOSIS — K219 Gastro-esophageal reflux disease without esophagitis: Secondary | ICD-10-CM

## 2022-05-04 LAB — CBC WITH DIFFERENTIAL/PLATELET
Basophils Absolute: 0 10*3/uL (ref 0.0–0.1)
Basophils Relative: 0.7 % (ref 0.0–3.0)
Eosinophils Absolute: 0.1 10*3/uL (ref 0.0–0.7)
Eosinophils Relative: 1.9 % (ref 0.0–5.0)
HCT: 40.5 % (ref 36.0–46.0)
Hemoglobin: 14.1 g/dL (ref 12.0–15.0)
Lymphocytes Relative: 24.6 % (ref 12.0–46.0)
Lymphs Abs: 1.6 10*3/uL (ref 0.7–4.0)
MCHC: 34.8 g/dL (ref 30.0–36.0)
MCV: 93.9 fl (ref 78.0–100.0)
Monocytes Absolute: 0.5 10*3/uL (ref 0.1–1.0)
Monocytes Relative: 7.3 % (ref 3.0–12.0)
Neutro Abs: 4.4 10*3/uL (ref 1.4–7.7)
Neutrophils Relative %: 65.5 % (ref 43.0–77.0)
Platelets: 233 10*3/uL (ref 150.0–400.0)
RBC: 4.31 Mil/uL (ref 3.87–5.11)
RDW: 13.2 % (ref 11.5–15.5)
WBC: 6.7 10*3/uL (ref 4.0–10.5)

## 2022-05-04 LAB — LIPID PANEL
Cholesterol: 113 mg/dL (ref 0–200)
HDL: 44.8 mg/dL (ref >39.00)
LDL Cholesterol: 54 mg/dL (ref 0–99)
NonHDL: 67.86
Total CHOL/HDL Ratio: 3
Triglycerides: 67 mg/dL (ref 0.0–149.0)
VLDL: 13.4 mg/dL (ref 0.0–40.0)

## 2022-05-04 LAB — COMPREHENSIVE METABOLIC PANEL
ALT: 12 U/L (ref 0–35)
AST: 14 U/L (ref 0–37)
Albumin: 4.3 g/dL (ref 3.5–5.2)
Alkaline Phosphatase: 88 U/L (ref 39–117)
BUN: 14 mg/dL (ref 6–23)
CO2: 30 mEq/L (ref 19–32)
Calcium: 9.6 mg/dL (ref 8.4–10.5)
Chloride: 103 mEq/L (ref 96–112)
Creatinine, Ser: 0.86 mg/dL (ref 0.40–1.20)
GFR: 66.91 mL/min (ref >60.00)
Glucose, Bld: 95 mg/dL (ref 70–99)
Potassium: 4 mEq/L (ref 3.5–5.1)
Sodium: 141 mEq/L (ref 135–145)
Total Bilirubin: 1 mg/dL (ref 0.2–1.2)
Total Protein: 6.7 g/dL (ref 6.0–8.3)

## 2022-05-04 LAB — HEMOGLOBIN A1C: Hgb A1c MFr Bld: 6.2 % (ref 4.6–6.5)

## 2022-05-04 LAB — TSH: TSH: 1.22 u[IU]/mL (ref 0.35–5.50)

## 2022-05-04 LAB — VITAMIN B12: Vitamin B-12: 305 pg/mL (ref 211–911)

## 2022-05-04 LAB — VITAMIN D 25 HYDROXY (VIT D DEFICIENCY, FRACTURES): VITD: 38 ng/mL (ref 30.00–100.00)

## 2022-05-04 MED ORDER — ALBUTEROL SULFATE HFA 108 (90 BASE) MCG/ACT IN AERS: INHALATION_SPRAY | RESPIRATORY_TRACT | 5 refills | Status: AC

## 2022-05-04 MED ORDER — TRETINOIN 0.025 % EX CREA: TOPICAL_CREAM | Freq: Every day | CUTANEOUS | 1 refills | Status: AC

## 2022-05-04 NOTE — Assessment & Plan Note (Signed)
5 y f/u US due in

## 2022-05-04 NOTE — Assessment & Plan Note (Signed)
No clinical changes TSH added to labs

## 2022-05-04 NOTE — Patient Instructions (Addendum)
Antihistamines like claritin, zyrtec, xyzal and allegra are all fine (but not the D form)   A steroid nasal spray like flonase or nasacort is ok also for allergies  If you tolerate calcium Try to get 1200-1500 mg of calcium per day with at least 2000 iu of vitamin D - for bone health   If calcium constipates you-then just take vitamin D  Think about starting some exercise at the gym- start slow and gradually increase the time   When you can- eat fruit instead of sweets Try to get most of your carbohydrates from produce (with the exception of white potatoes)  Eat less bread/pasta/rice/snack foods/cereals/sweets and other items from the middle of the grocery store (processed carbs)  Biotin is a vitamin for skin and hair  Rogaine is ok for hair loss also - also ask the dermatologist   Labs today  Take care of yourself

## 2022-05-04 NOTE — Assessment & Plan Note (Signed)
No symptoms 

## 2022-05-04 NOTE — Assessment & Plan Note (Signed)
dexa 09/2020 No falls since disch from hospital with cva  No fractures  Now back on vit D Planning more weight bearing exercise

## 2022-05-04 NOTE — Assessment & Plan Note (Signed)
Reviewed health habits including diet and exercise and skin cancer prevention Reviewed appropriate screening tests for age  Also reviewed health mt list, fam hx and immunization status , as well as social and family history   See HPI Labs ordered  Declines shingrix Plans flu shot in the fall  Up to date pulm care for lung nodules Mammogram utd 1/ 2023  dexa utd 09/2020, no falls or fractures, planning exercise  Colonoscopy 2020 with 7 y recall

## 2022-05-04 NOTE — Assessment & Plan Note (Signed)
No new symptoms.

## 2022-05-04 NOTE — Assessment & Plan Note (Addendum)
prtonix 40 mg daily prn  Vit B12 added to labs

## 2022-05-04 NOTE — Assessment & Plan Note (Signed)
Under cardiology care  Doing well with metoprolol xl 25 mg daily and eliquis

## 2022-05-04 NOTE — Assessment & Plan Note (Signed)
Followed by pulmonary  Will plan 1 y f/u in march 2024

## 2022-05-04 NOTE — Assessment & Plan Note (Signed)
a1c ordered  disc imp of low glycemic diet and wt loss to prevent DM2  

## 2022-05-04 NOTE — Progress Notes (Signed)
Subjective:    Patient ID: Deborah Carey, female    DOB: 1951/02/28, 71 y.o.   MRN: [id number]  HPI Here for health maintenance exam and to review chronic medical problems  She had medicare intake yesterday   Wt Readings from Last 3 Encounters:  05/04/22 180 lb (81.6 kg)  05/03/22 182 lb (82.6 kg)  01/04/22 184 lb 2 oz (83.5 kg)   31.89 kg/m  Eating less in general   [place]  Fairly busy overall   Gets tired easily  Ever since the stroke    Immunization History  Administered Date(s) Administered   Fluad Quad(high Dose 65+) 05/14/2019, 05/29/2020   Influenza Split 09/27/2011   Influenza, High Dose Seasonal PF 07/18/2017   Influenza,inj,Quad PF,6+ Mos 09/09/2015, 04/29/2016, 05/08/2018   PFIZER Comirnaty(Gray Top)Covid-19 Tri-Sucrose Vaccine 10/10/2019, 10/31/2019   PFIZER(Purple Top)SARS-COV-2 Vaccination 07/27/2020   Pneumococcal Conjugate-13 04/28/2015   Pneumococcal Polysaccharide-23 11/29/2013   Td 01/21/2003   Tdap 11/05/2012   Health Maintenance Due  Topic Date Due   Zoster Vaccines- Shingrix (1 of 2) Never done   COVID-19 Vaccine (4 - Pfizer series) 09/21/2020   INFLUENZA VACCINE  04/05/2022   Nasal allergies Occ needs inhaler as well   Interested in [place] a cream for brown spots  Has used it before    Needs refill of inhaler  Shingrix : maybe later, not now   Gets flu shot in the fall   Pulmonary is following lung nodules -seen in march and will f/u in a year All was stable     Mammogram 09/2021 Self breast exam: no lumps   Dexa  09/2020 Osteopenia  Falls: had a fall right after her hosp-none since  Fractures: none  Supplements : was off D for a long time and now is back on it  Exercise : on feet a lot in general  No regular program and too hot to go outside  Wants to go to the gym again   Colon cancer screening  Colonoscopy 06/2019 with 7 y recall    Hyperlipidemia Lab Results  Component Value Date   CHOL 122  09/07/2021   HDL 39.80 09/07/2021   LDLCALC 62 09/07/2021   LDLDIRECT 141.8 10/30/2013   TRIG 101.0 09/07/2021   CHOLHDL 3 09/07/2021    Takes atorvastatin 40 mg daily  Some high fat food and beef -tries not too much   GERD- does not take protonix every day   A fib Metoprolol xl 25 mg daily  Eliquis 5 mg bid   H/o thyroid nodule  2019 [place] - was recommended re check in 5 years No clinical changes    Prediabetes Lab Results  Component Value Date   HGBA1C 5.8 (H) 05/26/2021   Due for labs  Really craves sugar-this is her weakness   Lab Results  Component Value Date   VITAMINB12 438 07/07/2010   Patient Active Problem List   Diagnosis Date Noted   GERD (gastroesophageal reflux disease) 05/04/2022   Current use of proton pump inhibitor 05/04/2022   Skin spots-aging 05/04/2022   Right shoulder pain 01/04/2022   Chronic ischemic left MCA stroke 06/18/2021   Cholelithiasis without cholecystitis 06/07/2021   Nodule of apex of right lung 06/07/2021   History of CVA (cerebrovascular accident) 05/25/2021   Atrial fibrillation ([place])    Mass of breast, left 11/13/2020   Colon cancer screening 05/14/2019   Vitamin D deficiency 05/08/2018   Flushing 06/20/2017   H/O fracture  of fibula 01/21/2014   Osteopenia 01/07/2014   Family history of osteoporosis 11/29/2013   Estrogen deficiency 11/29/2013   Hyperlipidemia 11/29/2013   Encounter for Medicare annual wellness exam 10/29/2013   Irregular heartbeat 10/07/2013   Left thyroid nodule 09/26/2013   Enlarged thyroid 09/16/2013   Primary osteoarthritis of both knees 11/05/2012   Other screening mammogram 09/27/2011   Routine general medical examination at a health care facility 09/19/2011   Class 1 obesity due to excess calories with serious comorbidity and body mass index (BMI) of 34.0 to 34.9 in adult 06/06/2008   Prediabetes 06/06/2008   Anxiety state 04/17/2007   Past Medical History:  Diagnosis Date   Allergy     allergic rhinitis   Anxiety    Cataract    removed both eyes    Infertility, female    Osteoarthritis of both knees    OA    PONV (postoperative nausea and vomiting)    Post-menopausal bleeding    neg endo bx.   Past Surgical History:  Procedure Laterality Date   cataract surgery  2011   CESAREAN SECTION     times 2   COLONOSCOPY  2010   hems    ENDOMETRIAL BIOPSY     neg for CA cells   FACIAL COSMETIC SURGERY  2006   face lift   FOOT SURGERY  1998   rt heel spur removal   IR CT HEAD LTD  05/25/2021   IR PERCUTANEOUS ART THROMBECTOMY/INFUSION INTRACRANIAL INC DIAG ANGIO  05/25/2021   IR US GUIDE VASC ACCESS RIGHT  05/25/2021   RADIOLOGY WITH ANESTHESIA N/A 05/25/2021   Procedure: IR WITH ANESTHESIA;  Surgeon: Aletta Edouard, MD;  Location: [facility];  Service: Radiology;  Laterality: N/A;   REFRACTIVE SURGERY  08/1999   Social History   Tobacco Use   Smoking status: Never   Smokeless tobacco: Never  Vaping Use   Vaping Use: Never used  Substance Use Topics   Alcohol use: No    Alcohol/week: 0.0 standard drinks of alcohol   Drug use: No   Family History  Problem Relation Age of Onset   COPD Brother    Stroke Mother    Hypertension Mother    Osteoporosis Mother    Alzheimer's disease Father    Hypertension Father    Osteoporosis Father    Depression Sister    Hypertension Sister    Osteoporosis Sister    Thyroid disease Sister    Hypothyroidism Sister    Parkinson's disease Sister    Osteoporosis Sister    Hypothyroidism Sister    Breast cancer Neg Hx    Colon polyps Neg Hx    Colon cancer Neg Hx    Esophageal cancer Neg Hx    Rectal cancer Neg Hx    Stomach cancer Neg Hx    No Known Allergies Current Outpatient Medications on File Prior to Visit  Medication Sig Dispense Refill   acetaminophen (TYLENOL) 325 MG tablet Take 1-2 tablets (325-650 mg total) by mouth every 4 (four) hours as needed for mild pain.     atorvastatin (LIPITOR) 40 MG tablet TAKE ONE  TABLET BY MOUTH DAILY 90 tablet 1   ELIQUIS 5 MG TABS tablet TAKE ONE TABLET BY MOUTH TWICE DAILY 180 tablet 1   metoprolol succinate (TOPROL-XL) 25 MG 24 hr tablet TAKE ONE TABLET BY MOUTH DAILY 90 tablet 1   pantoprazole (PROTONIX) 40 MG tablet TAKE 1 TABLET BY MOUTH DAILY FOR REFLUX-CAN USE AS NEEDED  IF SYMPTOMS CONTROLLED 90 tablet 0   traZODone (DESYREL) 50 MG tablet TAKE 1/2 TO 1 TABLET BY MOUTH AT BEDTIMEAS NEEDED FOR SLEEP 30 tablet 5   No current facility-administered medications on file prior to visit.    Review of Systems  Constitutional:  Negative for activity change, appetite change, fatigue, fever and unexpected weight change.  HENT:  Negative for congestion, ear pain, rhinorrhea, sinus pressure and sore throat.   Eyes:  Negative for pain, redness and visual disturbance.  Respiratory:  Negative for cough, shortness of breath and wheezing.   Cardiovascular:  Negative for chest pain and palpitations.  Gastrointestinal:  Negative for abdominal pain, blood in stool, constipation and diarrhea.  Endocrine: Negative for polydipsia and polyuria.  Genitourinary:  Negative for dysuria, frequency and urgency.  Musculoskeletal:  Negative for arthralgias, back pain and myalgias.  Skin:  Negative for pallor and rash.  Allergic/Immunologic: Negative for environmental allergies.  Neurological:  Negative for dizziness, syncope and headaches.  Hematological:  Negative for adenopathy. Does not bruise/bleed easily.  Psychiatric/Behavioral:  Negative for decreased concentration and dysphoric mood. The patient is not nervous/anxious.        Objective:   Physical Exam Constitutional:      General: She is not in acute distress.    Appearance: Normal appearance. She is well-developed. She is obese. She is not ill-appearing or diaphoretic.  HENT:     Head: Normocephalic and atraumatic.     Right Ear: Tympanic membrane, ear canal and external ear normal.     Left Ear: Tympanic membrane, ear  canal and external ear normal.     Nose: Nose normal. No congestion.     Mouth/Throat:     Mouth: Mucous membranes are moist.     Pharynx: Oropharynx is clear. No posterior oropharyngeal erythema.  Eyes:     General: No scleral icterus.    Extraocular Movements: Extraocular movements intact.     Conjunctiva/sclera: Conjunctivae normal.     Pupils: Pupils are equal, round, and reactive to light.  Neck:     Thyroid: No thyromegaly.     Vascular: No carotid bruit or JVD.     Comments: No change in thyroid exam Cardiovascular:     Rate and Rhythm: Normal rate. Rhythm irregular.     Pulses: Normal pulses.     Heart sounds: Normal heart sounds.     No gallop.  Pulmonary:     Effort: Pulmonary effort is normal. No respiratory distress.     Breath sounds: Normal breath sounds. No wheezing.     Comments: Good air exch Chest:     Chest wall: No tenderness.  Abdominal:     General: Bowel sounds are normal. There is no distension or abdominal bruit.     Palpations: Abdomen is soft. There is no mass.     Tenderness: There is no abdominal tenderness.     Hernia: No hernia is present.  Genitourinary:    Comments: Breast exam: No mass, nodules, thickening, tenderness, bulging, retraction, inflamation, nipple discharge or skin changes noted.  No axillary or clavicular LA.     Musculoskeletal:        General: No tenderness. Normal range of motion.     Cervical back: Normal range of motion and neck supple. No rigidity. No muscular tenderness.     Right lower leg: No edema.     Left lower leg: No edema.     Comments: No kyphosis   Lymphadenopathy:     Cervical:  No cervical adenopathy.  Skin:    General: Skin is warm and dry.     Coloration: Skin is not pale.     Findings: No erythema or rash.     Comments: Solar lentigines diffusely Scattered sks   Some age spots on face and hands  Neurological:     Mental Status: She is alert. Mental status is at baseline.     Cranial Nerves: No  cranial nerve deficit.     Motor: No abnormal muscle tone.     Coordination: Coordination normal.     Gait: Gait normal.     Deep Tendon Reflexes: Reflexes are normal and symmetric. Reflexes normal.  Psychiatric:        Mood and Affect: Mood normal.        Cognition and Memory: Cognition and memory normal.           Assessment & Plan:   Problem List Items Addressed This Visit       Cardiovascular and Mediastinum   Atrial fibrillation [facility])    Under cardiology care  Doing well with metoprolol xl 25 mg daily and eliquis       Relevant Orders   TSH (Completed)   Comprehensive metabolic panel (Completed)   Chronic ischemic left MCA stroke    No new symptoms         Respiratory   Nodule of apex of right lung    Followed by pulmonary  Will plan 1 y f/u in march 2024         Digestive   Cholelithiasis without cholecystitis    No symptoms       Relevant Orders   Comprehensive metabolic panel (Completed)   GERD (gastroesophageal reflux disease)    prtonix 40 mg daily prn  Vit B12 added to labs         Endocrine   Enlarged thyroid    No clinical changes TSH added to labs        Relevant Orders   TSH (Completed)   Left thyroid nodule    5 y f/u US due in       Relevant Orders   TSH (Completed)     Musculoskeletal and Integument   Osteopenia    dexa 09/2020 No falls since disch from hospital with cva  No fractures  Now back on vit D Planning more weight bearing exercise        Skin spots-aging    Pt wants to try generic retin A for this Aware she will have to pay out of pocket Low dose -0.025 % written Disc imp of sun protectoin         Other   Current use of proton pump inhibitor    protonix daily prn  Added B12 level to labs       Relevant Orders   Vitamin B12 (Completed)   Hyperlipidemia    Disc goals for lipids and reasons to control them Rev last labs with pt Rev low sat fat diet in detail  Lab today  Continues  atorvastatin 40 mg daily  Diet is fair -does eat some high fat food      Relevant Orders   Lipid panel (Completed)   Comprehensive metabolic panel (Completed)   Prediabetes    a1c ordered disc imp of low glycemic diet and wt loss to prevent DM2       Relevant Orders   Comprehensive metabolic panel (Completed)   CBC with Differential/Platelet (Completed)   Hemoglobin  A1c (Completed)   Routine general medical examination at a health care facility - Primary    Reviewed health habits including diet and exercise and skin cancer prevention Reviewed appropriate screening tests for age  Also reviewed health mt list, fam hx and immunization status , as well as social and family history   See HPI Labs ordered  Declines shingrix Plans flu shot in the fall  Up to date pulm care for lung nodules Mammogram utd 1/ 2023  dexa utd 09/2020, no falls or fractures, planning exercise  Colonoscopy 2020 with 7 y recall        Vitamin D deficiency    In setting of osteopenia  D level ordered       Relevant Orders   VITAMIN D 25 Hydroxy (Vit-D Deficiency, Fractures) (Completed)

## 2022-05-04 NOTE — Assessment & Plan Note (Signed)
Disc goals for lipids and reasons to control them Rev last labs with pt Rev low sat fat diet in detail  Lab today  Continues atorvastatin 40 mg daily  Diet is fair -does eat some high fat food

## 2022-05-04 NOTE — Assessment & Plan Note (Signed)
In setting of osteopenia  D level ordered

## 2022-05-04 NOTE — Assessment & Plan Note (Signed)
protonix daily prn  Added B12 level to labs

## 2022-05-04 NOTE — Assessment & Plan Note (Signed)
Pt wants to try generic retin A for this Aware she will have to pay out of pocket Low dose -0.025 % written Disc imp of sun protectoin

## 2022-05-05 ENCOUNTER — Encounter: Payer: Self-pay | Admitting: *Deleted

## 2022-05-18 ENCOUNTER — Other Ambulatory Visit: Payer: Self-pay | Admitting: Family Medicine

## 2022-05-19 NOTE — Telephone Encounter (Signed)
CPE was on 05/04/22, last filled on 10/27/21 #30 tabs with 5 refills

## 2022-06-23 ENCOUNTER — Telehealth: Payer: Self-pay | Admitting: *Deleted

## 2022-06-23 NOTE — Patient Outreach (Signed)
  Care Coordination   06/23/2022 Name: Deborah Carey MRN: [id number] DOB: 1950/09/18   Care Coordination Outreach Attempts:  An unsuccessful telephone outreach was attempted today to offer the patient information about available care coordination services as a benefit of their health plan.   Follow Up Plan:  Additional outreach attempts will be made to offer the patient care coordination information and services.   Encounter Outcome:  No Answer  Care Coordination Interventions Activated:  Yes   Care Coordination Interventions:  No, not indicated    [employer] [phone]

## 2022-06-29 ENCOUNTER — Encounter: Payer: Self-pay | Admitting: Cardiology

## 2022-06-30 ENCOUNTER — Telehealth: Payer: Self-pay | Admitting: Cardiology

## 2022-06-30 NOTE — Telephone Encounter (Signed)
Patient is scheduled for the DOD slot tomorrow with Dr. Garen Lah per [facility] message below.

## 2022-06-30 NOTE — Telephone Encounter (Signed)
Patient c/o Palpitations:  High priority if patient c/o lightheadedness, shortness of breath, or chest pain  How long have you had palpitations/irregular HR/ Afib? Doesn't know how long she has been in A-fib.  Are you having the symptoms now? D  Are you currently experiencing lightheadedness, SOB or CP? NO  Do you have a history of afib (atrial fibrillation) or irregular heart rhythm? Has history of A-Fib   Have you checked your BP or HR? (document readings if available): no  Are you experiencing any other symptoms? Husband called and said patient pass out yesterday morning for a couple of minutes.  Then she rest because she was tired and then she was fine after that.  He wants to know if she may have had a mini stroke.  She had a stroke a couple of years ago.

## 2022-07-01 ENCOUNTER — Encounter: Payer: Self-pay | Admitting: Cardiology

## 2022-07-01 ENCOUNTER — Ambulatory Visit: Payer: Medicare PPO | Attending: Cardiology | Admitting: Cardiology

## 2022-07-01 VITALS — BP 150/80 | HR 71 | Ht 63.0 in | Wt 179.4 lb

## 2022-07-01 DIAGNOSIS — R55 Syncope and collapse: Secondary | ICD-10-CM | POA: Diagnosis not present

## 2022-07-01 DIAGNOSIS — I4821 Permanent atrial fibrillation: Secondary | ICD-10-CM | POA: Diagnosis not present

## 2022-07-01 NOTE — Progress Notes (Signed)
Cardiology Office Note:    Date:  07/01/2022   ID:  Deborah Carey, DOB 1951-01-10, MRN [id number]  PCP:  Abner Greenspan, MD   [facility] Providers Cardiologist:  Kate Sable, MD     Referring MD: Abner Greenspan, MD   Chief Complaint  Patient presents with   Follow-up    Afid, Syncope with colapse, SOB    History of Present Illness:    Deborah Carey is a 71 y.o. female with a hx of atrial fibrillation, CVA s/p mechanical thrombectomy 05/2021 Who presents due to a syncopal episode.  Patient went to the beach last week, was in the bathroom putting on make-up when she suddenly felt dizzy.  She sat on the commode, slumped over.  Husband helped patient to the floor, if not she would have passed out.  She laid down for couple of minutes with resolution of symptoms.  She and her husband returned home from the beach.  Has not had any symptoms since.  Denies palpitations, takes Toprol-XL and Eliquis for A-fib control, has not missed any medication doses.  Prior notes Echocardiogram 05/2021 showed EF 50 to 55%.  Normal LA size.     Past Medical History:  Diagnosis Date   Allergy    allergic rhinitis   Anxiety    Cataract    removed both eyes    Infertility, female    Osteoarthritis of both knees    OA    PONV (postoperative nausea and vomiting)    Post-menopausal bleeding    neg endo bx.    Past Surgical History:  Procedure Laterality Date   cataract surgery  2011   CESAREAN SECTION     times 2   COLONOSCOPY  2010   hems    ENDOMETRIAL BIOPSY     neg for CA cells   FACIAL COSMETIC SURGERY  2006   face lift   FOOT SURGERY  1998   rt heel spur removal   IR CT HEAD LTD  05/25/2021   IR PERCUTANEOUS ART THROMBECTOMY/INFUSION INTRACRANIAL INC DIAG ANGIO  05/25/2021   IR US GUIDE VASC ACCESS RIGHT  05/25/2021   RADIOLOGY WITH ANESTHESIA N/A 05/25/2021   Procedure: IR WITH ANESTHESIA;  Surgeon: Aletta Edouard, MD;  Location: [place];  Service: Radiology;  Laterality: N/A;    REFRACTIVE SURGERY  08/1999    Current Medications: Current Meds  Medication Sig   acetaminophen (TYLENOL) 325 MG tablet Take 1-2 tablets (325-650 mg total) by mouth every 4 (four) hours as needed for mild pain.   albuterol (VENTOLIN HFA) 108 (90 Base) MCG/ACT inhaler INHALE 2 PUFFS INTO THE LUNGS EVERY 4 (FOUR) HOURS AS NEEDED.   atorvastatin (LIPITOR) 40 MG tablet TAKE ONE TABLET BY MOUTH DAILY   ELIQUIS 5 MG TABS tablet TAKE ONE TABLET BY MOUTH TWICE DAILY   metoprolol succinate (TOPROL-XL) 25 MG 24 hr tablet TAKE ONE TABLET BY MOUTH DAILY   pantoprazole (PROTONIX) 40 MG tablet TAKE 1 TABLET BY MOUTH DAILY FOR REFLUX-CAN USE AS NEEDED IF SYMPTOMS CONTROLLED   traZODone (DESYREL) 50 MG tablet TAKE 1/2 TO 1 TABLET BY MOUTH AT BEDTIMEAS NEEDED FOR SLEEP   tretinoin (RETIN-A) 0.025 % cream Apply topically at bedtime.     Allergies:   Patient has no known allergies.   Social History   Socioeconomic History   Marital status: Married    Spouse name: Not on file   Number of children: Not on file   Years of education:  Not on file   Highest education level: Not on file  Occupational History   Occupation: retired  Tobacco Use   Smoking status: Never   Smokeless tobacco: Never  Vaping Use   Vaping Use: Never used  Substance and Sexual Activity   Alcohol use: No    Alcohol/week: 0.0 standard drinks of alcohol   Drug use: No   Sexual activity: Yes    Partners: Male    Birth control/protection: Post-menopausal  Other Topics Concern   Not on file  Social History Narrative   Not on file   Social Determinants of Health   Financial Resource Strain: Low Risk  (05/20/2020)   Overall Financial Resource Strain (CARDIA)    Difficulty of Paying Living Expenses: Not hard at all  Food Insecurity: No Food Insecurity (05/20/2020)   Hunger Vital Sign    Worried About Running Out of Food in the Last Year: Never true    [place] in the Last Year: Never true  Transportation Needs: No  Transportation Needs (05/20/2020)   PRAPARE - Hydrologist (Medical): No    Lack of Transportation (Non-Medical): No  Physical Activity: Inactive (05/20/2020)   Exercise Vital Sign    Days of Exercise per Week: 0 days    Minutes of Exercise per Session: 0 min  Stress: No Stress Concern Present (05/20/2020)   [place]    Feeling of Stress : Not at all  Social Connections: Not on file     Family History: The patient's family history includes Alzheimer's disease in her father; COPD in her brother; Depression in her sister; Hypertension in her father, mother, and sister; Hypothyroidism in her sister and sister; Osteoporosis in her father, mother, sister, and sister; Parkinson's disease in her sister; Stroke in her mother; Thyroid disease in her sister. There is no history of Breast cancer, Colon polyps, Colon cancer, Esophageal cancer, Rectal cancer, or Stomach cancer.  ROS:   Please see the history of present illness.     All other systems reviewed and are negative.  EKGs/Labs/Other Studies Reviewed:    The following studies were reviewed today:   EKG:  EKG is  ordered today.  The ekg ordered today demonstrates atrial fibrillation, heart rate 71  Recent Labs: 05/04/2022: ALT 12; BUN 14; Creatinine, Ser 0.86; Hemoglobin 14.1; Platelets 233.0; Potassium 4.0; Sodium 141; TSH 1.22  Recent Lipid Panel    Component Value Date/Time   CHOL 113 05/04/2022 1043   TRIG 67.0 05/04/2022 1043   HDL 44.80 05/04/2022 1043   CHOLHDL 3 05/04/2022 1043   VLDL 13.4 05/04/2022 1043   LDLCALC 54 05/04/2022 1043   LDLDIRECT 141.8 10/30/2013 0951     Risk Assessment/Calculations:         Physical Exam:    VS:  BP (!) 150/80 (BP Location: Left Arm, Patient Position: Sitting, Cuff Size: Normal)   Pulse 71   Ht '5\' 3"'$  (1.6 m)   Wt 179 lb 6.4 oz (81.4 kg)   LMP 09/05/2008   SpO2 99%   BMI 31.78 kg/m      Wt Readings from Last 3 Encounters:  07/01/22 179 lb 6.4 oz (81.4 kg)  05/04/22 180 lb (81.6 kg)  05/03/22 182 lb (82.6 kg)     GEN:  Well nourished, well developed in no acute distress HEENT: Normal NECK: No JVD; No carotid bruits LYMPHATICS: No lymphadenopathy CARDIAC: Irregularly irregular, no murmurs RESPIRATORY:  Clear to auscultation without rales, wheezing or rhonchi  ABDOMEN: Soft, non-tender, non-distended MUSCULOSKELETAL:  No edema; No deformity  SKIN: Warm and dry NEUROLOGIC:  Alert and oriented x 3, right side appears weaker PSYCHIATRIC:  Normal affect   ASSESSMENT:    1. Syncope and collapse   2. Permanent atrial fibrillation (HCC)     PLAN:    In order of problems listed above:  Syncope, etiology unclear, could be vasovagal.  No further symptoms since.  Orthostatic vitals today with no evidence for orthostasis.  Place cardiac monitor to evaluate any significant arrhythmias, repeat echocardiogram. Persistent atrial fibrillation, CHA2DS2-VASc of 4 (age, gender, cva).  EKG shows A. fib heart rate 71.  DC cardioversion previously discussed, patient did not proceed.  Continue Toprol-XL, Eliquis.  Follow-up after echo and cardiac monitor.     Medication Adjustments/Labs and Tests Ordered: Current medicines are reviewed at length with the patient today.  Concerns regarding medicines are outlined above.  No orders of the defined types were placed in this encounter.   No orders of the defined types were placed in this encounter.    Patient Instructions  Medication Instructions:   Your physician recommends that you continue on your current medications as directed. Please refer to the Current Medication list given to you today.   *If you need a refill on your cardiac medications before your next appointment, please call your pharmacy*   Testing/Procedures:  Your physician has requested that you have an echocardiogram. Echocardiography is a painless test  that uses sound waves to create images of your heart. It provides your doctor with information about the size and shape of your heart and how well your heart's chambers and valves are working. This procedure takes approximately one hour. There are no restrictions for this procedure. Please do NOT wear cologne, perfume, aftershave, or lotions (deodorant is allowed). Please arrive 15 minutes prior to your appointment time.  2.  Your physician has recommended that you wear a Zio XT monitor for 2 weeks. This will be mailed to your home address in 4-5 business days.   Your clinician has requested a Zio heart rhythm monitor by [employer] to be mailed to your home for you to wear for 14 days. You should expect a small box to arrive via USPS (or FedEx in some cases) within this next week. If you do not receive it please call [employer] at [phone].  Closely watching your heart at this time will help your care team understand more and provide information needed to develop your plan of care.  Please apply your Zio patch monitor the day you receive it. Keep this packaging, you will use this to return your Zio monitor.  You will easily be able to apply the monitor with the instructions provided in the Patient Guide.  If you need assistance, [employer] representatives are available 24/7 at [phone].  You can also download the [facility] app on your phone to view detailed application instructions and log symptoms.  After you wear your monitor for 14 days, place it back in the blue box or envelope, along with your Symptom Log.  To send your monitor back: Simply use the pre-addressed and pre-paid box/envelope.  Send it back through [employer] the same day you remove it via your local post office or by placing it in your mailbox.  As soon as we receive the results, they will be reviewed and your clinician will contact you.  For the first 24 hours- it is essential  to not shower or exercise, to allow the patch  to adhere to your skin. Avoid excessive sweating to help maximize wear time. Do not submerge the device, no hot tubs, and no swimming pools. Keep any lotions or oils away from the patch. After 24 hours you may shower with the patch on. Take brief showers with your back facing the shower head.  Do not remove patch once it has been placed because that will interrupt data and decrease adhesive wear time. Push the button when you have any symptoms and write down what you were feeling. Once you have completed wearing your monitor, remove and place into box which has postage paid and place in your outgoing mailbox.  If for some reason you have misplaced your box then call our office and we can provide another box and/or mail it off for you.   Follow-Up: At [facility], you and your health needs are our priority.  As part of our continuing mission to provide you with exceptional heart care, we have created designated Provider Care Teams.  These Care Teams include your primary Cardiologist (physician) and Advanced Practice Providers (APPs -  Physician Assistants and Nurse Practitioners) who all work together to provide you with the care you need, when you need it.  We recommend signing up for the patient portal called "MyChart".  Sign up information is provided on this After Visit Summary.  MyChart is used to connect with patients for Virtual Visits (Telemedicine).  Patients are able to view lab/test results, encounter notes, upcoming appointments, etc.  Non-urgent messages can be sent to your provider as well.   To learn more about what you can do with MyChart, go to [email or web address].    Your next appointment:   6-8 week(s)  The format for your next appointment:   In Person  Provider:   You may see Kate Sable, MD or one of the following Advanced Practice Providers on your designated Care Team:   Murray Hodgkins, NP Christell Faith, PA-C Cadence Kathlen Mody, PA-C Gerrie Nordmann, NP     Other Instructions   Important Information About Sugar         Signed, Kate Sable, MD  07/01/2022 5:03 PM    [place]

## 2022-07-01 NOTE — Patient Instructions (Signed)
Medication Instructions:   Your physician recommends that you continue on your current medications as directed. Please refer to the Current Medication list given to you today.   *If you need a refill on your cardiac medications before your next appointment, please call your pharmacy*   Testing/Procedures:  Your physician has requested that you have an echocardiogram. Echocardiography is a painless test that uses sound waves to create images of your heart. It provides your doctor with information about the size and shape of your heart and how well your heart's chambers and valves are working. This procedure takes approximately one hour. There are no restrictions for this procedure. Please do NOT wear cologne, perfume, aftershave, or lotions (deodorant is allowed). Please arrive 15 minutes prior to your appointment time.  2.  Your physician has recommended that you wear a Zio XT monitor for 2 weeks. This will be mailed to your home address in 4-5 business days.   Your clinician has requested a Zio heart rhythm monitor by [employer] to be mailed to your home for you to wear for 14 days. You should expect a small box to arrive via USPS (or FedEx in some cases) within this next week. If you do not receive it please call [employer] at [phone].  Closely watching your heart at this time will help your care team understand more and provide information needed to develop your plan of care.  Please apply your Zio patch monitor the day you receive it. Keep this packaging, you will use this to return your Zio monitor.  You will easily be able to apply the monitor with the instructions provided in the Patient Guide.  If you need assistance, [employer] representatives are available 24/7 at [phone].  You can also download the [facility] app on your phone to view detailed application instructions and log symptoms.  After you wear your monitor for 14 days, place it back in the blue box or envelope, along  with your Symptom Log.  To send your monitor back: Simply use the pre-addressed and pre-paid box/envelope.  Send it back through [employer] the same day you remove it via your local post office or by placing it in your mailbox.  As soon as we receive the results, they will be reviewed and your clinician will contact you.  For the first 24 hours- it is essential to not shower or exercise, to allow the patch to adhere to your skin. Avoid excessive sweating to help maximize wear time. Do not submerge the device, no hot tubs, and no swimming pools. Keep any lotions or oils away from the patch. After 24 hours you may shower with the patch on. Take brief showers with your back facing the shower head.  Do not remove patch once it has been placed because that will interrupt data and decrease adhesive wear time. Push the button when you have any symptoms and write down what you were feeling. Once you have completed wearing your monitor, remove and place into box which has postage paid and place in your outgoing mailbox.  If for some reason you have misplaced your box then call our office and we can provide another box and/or mail it off for you.   Follow-Up: At [facility], you and your health needs are our priority.  As part of our continuing mission to provide you with exceptional heart care, we have created designated Provider Care Teams.  These Care Teams include your primary Cardiologist (physician) and Advanced Practice Providers (APPs -  Physician Assistants and Nurse Practitioners) who all work together to provide you with the care you need, when you need it.  We recommend signing up for the patient portal called "MyChart".  Sign up information is provided on this After Visit Summary.  MyChart is used to connect with patients for Virtual Visits (Telemedicine).  Patients are able to view lab/test results, encounter notes, upcoming appointments, etc.  Non-urgent messages can be sent to  your provider as well.   To learn more about what you can do with MyChart, go to [email or web address].    Your next appointment:   6-8 week(s)  The format for your next appointment:   In Person  Provider:   You may see Kate Sable, MD or one of the following Advanced Practice Providers on your designated Care Team:   Murray Hodgkins, NP Christell Faith, PA-C Cadence Kathlen Mody, PA-C Gerrie Nordmann, NP    Other Instructions   Important Information About Sugar

## 2022-07-04 ENCOUNTER — Ambulatory Visit: Payer: Medicare PPO | Admitting: Dermatology

## 2022-07-04 DIAGNOSIS — L300 Nummular dermatitis: Secondary | ICD-10-CM

## 2022-07-04 DIAGNOSIS — L578 Other skin changes due to chronic exposure to nonionizing radiation: Secondary | ICD-10-CM | POA: Diagnosis not present

## 2022-07-04 DIAGNOSIS — D1801 Hemangioma of skin and subcutaneous tissue: Secondary | ICD-10-CM

## 2022-07-04 DIAGNOSIS — Z1283 Encounter for screening for malignant neoplasm of skin: Secondary | ICD-10-CM | POA: Diagnosis not present

## 2022-07-04 DIAGNOSIS — L821 Other seborrheic keratosis: Secondary | ICD-10-CM

## 2022-07-04 MED ORDER — MOMETASONE FUROATE 0.1 % EX CREA: 1.0000 | TOPICAL_CREAM | Freq: Every day | CUTANEOUS | 0 refills | Status: AC | PRN

## 2022-07-04 NOTE — Addendum Note (Signed)
Addended by: Nestor Ramp on: 07/04/2022 04:23 PM   Modules accepted: Orders

## 2022-07-04 NOTE — Progress Notes (Signed)
New Patient Visit  Subjective  Deborah Carey is a 71 y.o. female who presents for the following: Total body skin exam (No hx of skin cancer).   The following portions of the chart were reviewed this encounter and updated as appropriate:       Review of Systems:  No other skin or systemic complaints except as noted in HPI or Assessment and Plan.  Objective  Well appearing patient in no apparent distress; mood and affect are within normal limits.  A full examination was performed including scalp, head, eyes, ears, nose, lips, neck, chest, axillae, abdomen, back, buttocks, bilateral upper extremities, bilateral lower extremities, hands, feet, fingers, toes, fingernails, and toenails. All findings within normal limits unless otherwise noted below.  L temple x 2, L forehead x 2, R forehead x 1, R upper eyelid/brow area x 1, R cheek 6, L cheek x 2, R temple x 1 (15) waxy tan macules face including right brow           upper back Pink scaly patches upper back    Assessment & Plan   Seborrheic Keratoses - Stuck-on, waxy, tan-brown papules and/or plaques  - Benign-appearing - Discussed benign etiology and prognosis. - Observe - Call for any changes - face, trunk  Hemangiomas - Red papules - Discussed benign nature - Observe - Call for any changes - trunk  Actinic Damage - Chronic condition, secondary to cumulative UV/sun exposure - diffuse scaly erythematous macules with underlying dyspigmentation - Recommend daily broad spectrum sunscreen SPF 30+ to sun-exposed areas, reapply every 2 hours as needed.  - Staying in the shade or wearing long sleeves, sun glasses (UVA+UVB protection) and wide brim hats (4-inch brim around the entire circumference of the hat) are also recommended for sun protection.  - Call for new or changing lesions.  Skin cancer screening performed today.   Seborrheic keratosis (15) L temple x 2, L forehead x 2, R forehead x 1, R upper  eyelid/brow area x 1, R cheek 6, L cheek x 2, R temple x 1  Discussed cosmetic procedure (cryotherapy), noncovered.  $60 for 1st lesion and $15 for each additional lesion if done on the same day.  Maximum charge $350.  One touch-up treatment included no charge. Discussed risks of treatment including dyspigmentation, small scar, and/or recurrence. Recommend daily broad spectrum sunscreen SPF 30+/photoprotection to treated areas once healed. Total treated today-15 lesions, charge $270  For hands Discussed the treatment option of BBL/laser.  Typically we recommend 1-3 treatment sessions about 5-8 weeks apart for best results.  The patient's condition may require "maintenance treatments" in the future.  The fee for BBL / laser treatments is $350 per treatment session for the whole face.  A fee can be quoted for other parts of the body. Insurance typically does not pay for BBL/laser treatments and therefore the fee is an out-of-pocket cost.   Destruction of lesion - L temple x 2, L forehead x 2, R forehead x 1, R upper eyelid/brow area x 1, R cheek 6, L cheek x 2, R temple x 1  Destruction method: cryotherapy   Informed consent: discussed and consent obtained   Lesion destroyed using liquid nitrogen: Yes   Region frozen until ice ball extended beyond lesion: Yes   Outcome: patient tolerated procedure well with no complications   Post-procedure details: wound care instructions given   Additional details:  Prior to procedure, discussed risks of blister formation, small wound, skin dyspigmentation, or rare scar  following cryotherapy. Recommend Vaseline ointment to treated areas while healing.   Nummular dermatitis upper back  Recommend mild soap and moisturizing cream 1-2 times daily.  Gentle skin care handout provided.    Start Mometasone cr qd/bid up to 5d/wk until clear, then prn flares  Topical steroids (such as triamcinolone, fluocinolone, fluocinonide, mometasone, clobetasol, halobetasol,  betamethasone, hydrocortisone) can cause thinning and lightening of the skin if they are used for too long in the same area. Your physician has selected the right strength medicine for your problem and area affected on the body. Please use your medication only as directed by your physician to prevent side effects.    mometasone (ELOCON) 0.1 % cream - upper back Apply 1 Application topically daily as needed (Rash). Qd to bid up to 5 days a week aa itchy rash on upper back until clear, then as needed for flares   Return in about 2 months (around 09/03/2022) for cosmetic sk f/u.  I, Othelia Pulling, RMA, am acting as scribe for Brendolyn Patty, MD .  Documentation: I have reviewed the above documentation for accuracy and completeness, and I agree with the above.  Brendolyn Patty MD

## 2022-07-04 NOTE — Patient Instructions (Addendum)

## 2022-07-05 ENCOUNTER — Ambulatory Visit: Payer: Medicare PPO | Admitting: Dermatology

## 2022-07-12 ENCOUNTER — Telehealth: Payer: Self-pay | Admitting: Cardiology

## 2022-07-12 DIAGNOSIS — I4821 Permanent atrial fibrillation: Secondary | ICD-10-CM

## 2022-07-12 NOTE — Telephone Encounter (Signed)
Patient calling in regards to the heart monitor. She states she hasn't heard anything. Please advise

## 2022-07-13 ENCOUNTER — Ambulatory Visit: Payer: Medicare PPO | Attending: Cardiology

## 2022-07-13 DIAGNOSIS — I4821 Permanent atrial fibrillation: Secondary | ICD-10-CM

## 2022-07-13 NOTE — Telephone Encounter (Signed)
Called patient back and informed her that I sent her a MyChart message as I did not have permission to leave a detailed VM. Also encouraged her to call back if she had any further questions.

## 2022-07-16 DIAGNOSIS — I4821 Permanent atrial fibrillation: Secondary | ICD-10-CM

## 2022-08-03 ENCOUNTER — Other Ambulatory Visit: Payer: Self-pay

## 2022-08-03 DIAGNOSIS — R55 Syncope and collapse: Secondary | ICD-10-CM

## 2022-08-05 DIAGNOSIS — I4821 Permanent atrial fibrillation: Secondary | ICD-10-CM | POA: Diagnosis not present

## 2022-08-09 ENCOUNTER — Telehealth: Payer: Self-pay

## 2022-08-09 NOTE — Telephone Encounter (Signed)
Janan Ridge, [place] 08/09/2022  9:42 AM EST Back to Top    Called patient. No answer. LMOV.   Kate Sable, MD 08/08/2022  4:30 PM EST     Cardiac monitor shows persistent atrial fibrillation.  Continue medications as prescribed.

## 2022-08-16 ENCOUNTER — Ambulatory Visit: Payer: Medicare PPO | Admitting: Dermatology

## 2022-08-17 ENCOUNTER — Ambulatory Visit: Payer: Medicare PPO | Admitting: Dermatology

## 2022-08-24 ENCOUNTER — Ambulatory Visit: Payer: Medicare PPO | Attending: Cardiology

## 2022-08-24 DIAGNOSIS — R55 Syncope and collapse: Secondary | ICD-10-CM | POA: Diagnosis not present

## 2022-08-24 LAB — ECHOCARDIOGRAM COMPLETE
AR max vel: 1.69 cm2
AV Area VTI: 1.67 cm2
AV Area mean vel: 1.85 cm2
AV Mean grad: 3 mmHg
AV Peak grad: 5.5 mmHg
Ao pk vel: 1.17 m/s
Calc EF: 51.1 %
S' Lateral: 3.6 cm
Single Plane A2C EF: 50.2 %
Single Plane A4C EF: 48.5 %

## 2022-08-26 ENCOUNTER — Ambulatory Visit: Payer: Medicare PPO | Admitting: Cardiology

## 2022-09-05 NOTE — Progress Notes (Signed)
Cardiology Office Note:    Date:  09/06/2022   ID:  Deborah Carey, DOB [birth year], MRN [id number]  PCP:  Abner Greenspan, MD   [facility] Cardiologist:  Kate Sable, MD { Click to update primary MD,subspecialty MD or APP then REFRESH:1}    Referring MD: Tower, Wynelle Fanny, MD   Chief Complaint  Patient presents with   Follow-up    6-8 week follow up, no new cardiac concerns     History of Present Illness:    Deborah Carey is a 72 y.o. female with a hx of AF (previously cardioversion discussed, but patient deferred), left MCA stroke, GERD, and HLD.  Evaluated in office by Dr. Garen Lah on 07/01/22, and had recently had a near syncopal episode. Echo ordered and 14 day zio were ordered and showed EF 50-55%, no RWMA, no LVH, LA was mild to mod dilated, mild MVR, mild TVR. Zio monitor showed atrial fibrillation occurred continuously (100% burden), ranging from 34-132 bpm (avg of 69 bpm).  She presents today for follow up of her recent testing. Discussed results and that her near syncopal episode does not appear to be cardiac in nature. She has not had any further presyncope or dizziness. She denies chest pain, palpitations, dyspnea, pnd, orthopnea, n, v, dizziness, syncope, edema, weight gain, or early satiety.  Denies hematochezia, hematuria, hemoptysis.    Past Medical History:  Diagnosis Date   Allergy    allergic rhinitis   Anxiety    Cataract    removed both eyes    Infertility, female    Osteoarthritis of both knees    OA    PONV (postoperative nausea and vomiting)    Post-menopausal bleeding    neg endo bx.    Past Surgical History:  Procedure Laterality Date   cataract surgery  2011   CESAREAN SECTION     times 2   COLONOSCOPY  2010   hems    ENDOMETRIAL BIOPSY     neg for CA cells   FACIAL COSMETIC SURGERY  2006   face lift   FOOT SURGERY  1998   rt heel spur removal   IR CT HEAD LTD  05/25/2021   IR PERCUTANEOUS ART  THROMBECTOMY/INFUSION INTRACRANIAL INC DIAG ANGIO  05/25/2021   IR US GUIDE VASC ACCESS RIGHT  05/25/2021   RADIOLOGY WITH ANESTHESIA N/A 05/25/2021   Procedure: IR WITH ANESTHESIA;  Surgeon: Aletta Edouard, MD;  Location: [facility];  Service: Radiology;  Laterality: N/A;   REFRACTIVE SURGERY  08/1999    Current Medications: Current Meds  Medication Sig   acetaminophen (TYLENOL) 325 MG tablet Take 1-2 tablets (325-650 mg total) by mouth every 4 (four) hours as needed for mild pain.   albuterol (VENTOLIN HFA) 108 (90 Base) MCG/ACT inhaler INHALE 2 PUFFS INTO THE LUNGS EVERY 4 (FOUR) HOURS AS NEEDED.   atorvastatin (LIPITOR) 40 MG tablet TAKE ONE TABLET BY MOUTH DAILY   ELIQUIS 5 MG TABS tablet TAKE ONE TABLET BY MOUTH TWICE DAILY   metoprolol succinate (TOPROL-XL) 25 MG 24 hr tablet TAKE ONE TABLET BY MOUTH DAILY   mometasone (ELOCON) 0.1 % cream Apply 1 Application topically daily as needed (Rash). Qd to bid up to 5 days a week aa itchy rash on upper back until clear, then as needed for flares   pantoprazole (PROTONIX) 40 MG tablet TAKE 1 TABLET BY MOUTH DAILY FOR REFLUX-CAN USE AS NEEDED IF SYMPTOMS CONTROLLED   traZODone (DESYREL) 50 MG tablet TAKE  1/2 TO 1 TABLET BY MOUTH AT BEDTIMEAS NEEDED FOR SLEEP   tretinoin (RETIN-A) 0.025 % cream Apply topically at bedtime.     Allergies:   Patient has no known allergies.   Social History   Socioeconomic History   Marital status: Married    Spouse name: Not on file   Number of children: Not on file   Years of education: Not on file   Highest education level: Not on file  Occupational History   Occupation: retired  Tobacco Use   Smoking status: Never   Smokeless tobacco: Never  Vaping Use   Vaping Use: Never used  Substance and Sexual Activity   Alcohol use: No    Alcohol/week: 0.0 standard drinks of alcohol   Drug use: No   Sexual activity: Yes    Partners: Male    Birth control/protection: Post-menopausal  Other Topics Concern   Not  on file  Social History Narrative   Not on file   Social Determinants of Health   Financial Resource Strain: Low Risk  (05/20/2020)   Overall Financial Resource Strain (CARDIA)    Difficulty of Paying Living Expenses: Not hard at all  Food Insecurity: No Food Insecurity (05/20/2020)   Hunger Vital Sign    Worried About Running Out of Food in the Last Year: Never true    [place] in the Last Year: Never true  Transportation Needs: No Transportation Needs (05/20/2020)   PRAPARE - Hydrologist (Medical): No    Lack of Transportation (Non-Medical): No  Physical Activity: Inactive (05/20/2020)   Exercise Vital Sign    Days of Exercise per Week: 0 days    Minutes of Exercise per Session: 0 min  Stress: No Stress Concern Present (05/20/2020)   [name]    Feeling of Stress : Not at all  Social Connections: Not on file     Family History: The patient's family history includes Alzheimer's disease in her father; COPD in her brother; Depression in her sister; Hypertension in her father, mother, and sister; Hypothyroidism in her sister and sister; Osteoporosis in her father, mother, sister, and sister; Parkinson's disease in her sister; Stroke in her mother; Thyroid disease in her sister. There is no history of Breast cancer, Colon polyps, Colon cancer, Esophageal cancer, Rectal cancer, or Stomach cancer.  ROS:   Review of Systems  Constitutional: Negative.   HENT: Negative.    Eyes: Negative.   Respiratory: Negative.    Cardiovascular:  Positive for leg swelling (trace, sock line). Negative for chest pain, palpitations, orthopnea, claudication and PND.  Gastrointestinal:  Negative for blood in stool and melena.  Genitourinary: Negative.  Negative for hematuria.  Musculoskeletal: Negative.   Skin: Negative.   Neurological:  Positive for weakness (right arm from CVA). Negative for dizziness  and loss of consciousness.  Endo/Heme/Allergies:  Bruises/bleeds easily.  Psychiatric/Behavioral: Negative.       EKGs/Labs/Other Studies Reviewed:    The following studies were reviewed today:  08/24/22 echo complete - EF 50-55%, no RWMA, no LVH. LA was mild to mod dilated. Mild MVR. Mild TVR.   14 day Zio - Atrial Fibrillation occurred continuously (100% burden), ranging from 34-132 bpm (avg of 69 bpm).  EKG:  EKG is ordered today.  The ekg ordered today demonstrates AF, 71 bpm, consistent with previous tracings.   Recent Labs: 05/04/2022: ALT 12; BUN 14; Creatinine, Ser 0.86; Hemoglobin 14.1; Platelets 233.0;  Potassium 4.0; Sodium 141; TSH 1.22  Recent Lipid Panel    Component Value Date/Time   CHOL 113 05/04/2022 1043   TRIG 67.0 05/04/2022 1043   HDL 44.80 05/04/2022 1043   CHOLHDL 3 05/04/2022 1043   VLDL 13.4 05/04/2022 1043   LDLCALC 54 05/04/2022 1043   LDLDIRECT 141.8 10/30/2013 0951     Risk Assessment/Calculations:    CHA2DS2-VASc Score = 4  {Confirm score is correct.  If not, click here to update score.  REFRESH note.  :1} This indicates a 4.8% annual risk of stroke. The patient's score is based upon: CHF History: 0 HTN History: 0 Diabetes History: 0 Stroke History: 2 Vascular Disease History: 0 Age Score: 1 Gender Score: 1   {This patient has a significant risk of stroke if diagnosed with atrial fibrillation.  Please consider VKA or DOAC agent for anticoagulation if the bleeding risk is acceptable.   You can also use the SmartPhrase .[place] for documentation.   :[id number]}            Physical Exam:    VS:  BP 118/72   Pulse 71   Ht '5\' 4"'$  (1.626 m)   Wt 181 lb 12.8 oz (82.5 kg)   LMP 09/05/2008   SpO2 99%   BMI 31.21 kg/m     Wt Readings from Last 3 Encounters:  09/06/22 181 lb 12.8 oz (82.5 kg)  07/01/22 179 lb 6.4 oz (81.4 kg)  05/04/22 180 lb (81.6 kg)     GEN:  Well nourished, well developed in no acute distress HEENT:  Normal NECK: No JVD; No carotid bruits LYMPHATICS: No lymphadenopathy CARDIAC: irregularly irregular, no murmurs, rubs, gallops RESPIRATORY:  Clear to auscultation without rales, wheezing or rhonchi  ABDOMEN: Soft, non-tender, non-distended MUSCULOSKELETAL:  trace edema at sock line; No deformity  SKIN: Warm and dry NEUROLOGIC:  Alert and oriented x 3 PSYCHIATRIC:  Normal affect   ASSESSMENT:    1. Permanent atrial fibrillation ([place])   2. Hyperlipidemia, unspecified hyperlipidemia type   3. Prediabetes    PLAN:    In order of problems listed above:  Permanent AF -  Diagnosed in 05/2021 as a result of a left MCA stroke. Review of EKGs suggest that she was in NSR in 2015, in 2019 she sent an EKG reading from her apple watch consistent with AF. Cardioversion had been discussed previously, but patient did not want to proceed.  CHA2DS2-VASc Score = 4  continue Eliquis 5 mg BID (no indication for dose reduction), metoprolol succinate 25 mg daily.  HLD - atorvastatin, managed by PCP, LDL 54 05/04/22 Prediabetes - She voices concern and wanted to know the implications of this r/t her AF, recent A1C 6.2%. Discussed how DM can increase her risk for other cardiac and noncardiac related disease processes. Discussed dietary changes that should occur, encouraged her to increase her physical activity as tolerated           Medication Adjustments/Labs and Tests Ordered: Current medicines are reviewed at length with the patient today.  Concerns regarding medicines are outlined above.  Orders Placed This Encounter  Procedures   EKG 12-Lead   No orders of the defined types were placed in this encounter.   Patient Instructions  Medication Instructions:  Your physician recommends that you continue on your current medications as directed. Please refer to the Current Medication list given to you today.  *If you need a refill on your cardiac medications before your next appointment, please call your  pharmacy*   Lab Work: No labs ordered  If you have labs (blood work) drawn today and your tests are completely normal, you will receive your results only by: [place] (if you have MyChart) OR A paper copy in the mail If you have any lab test that is abnormal or we need to change your treatment, we will call you to review the results.   Testing/Procedures: No labs ordered  Follow-Up: At [facility], you and your health needs are our priority.  As part of our continuing mission to provide you with exceptional heart care, we have created designated Provider Care Teams.  These Care Teams include your primary Cardiologist (physician) and Advanced Practice Providers (APPs -  Physician Assistants and Nurse Practitioners) who all work together to provide you with the care you need, when you need it.  We recommend signing up for the patient portal called "MyChart".  Sign up information is provided on this After Visit Summary.  MyChart is used to connect with patients for Virtual Visits (Telemedicine).  Patients are able to view lab/test results, encounter notes, upcoming appointments, etc.  Non-urgent messages can be sent to your provider as well.   To learn more about what you can do with MyChart, go to [email or web address].    Your next appointment:   6 month(s)  The format for your next appointment:   In Person  Provider:   Kate Sable, MD   Important Information About Sugar         Signed, Trudi Ida, NP  09/06/2022 4:36 PM    [place]

## 2022-09-06 ENCOUNTER — Ambulatory Visit: Payer: Medicare PPO | Attending: Cardiology | Admitting: Cardiology

## 2022-09-06 ENCOUNTER — Encounter: Payer: Self-pay | Admitting: Physician Assistant

## 2022-09-06 VITALS — BP 118/72 | HR 71 | Ht 64.0 in | Wt 181.8 lb

## 2022-09-06 DIAGNOSIS — E785 Hyperlipidemia, unspecified: Secondary | ICD-10-CM

## 2022-09-06 DIAGNOSIS — I4821 Permanent atrial fibrillation: Secondary | ICD-10-CM

## 2022-09-06 DIAGNOSIS — R7303 Prediabetes: Secondary | ICD-10-CM | POA: Diagnosis not present

## 2022-09-06 NOTE — Patient Instructions (Signed)
Medication Instructions:  Your physician recommends that you continue on your current medications as directed. Please refer to the Current Medication list given to you today.  *If you need a refill on your cardiac medications before your next appointment, please call your pharmacy*   Lab Work: No labs ordered  If you have labs (blood work) drawn today and your tests are completely normal, you will receive your results only by: [place] (if you have MyChart) OR A paper copy in the mail If you have any lab test that is abnormal or we need to change your treatment, we will call you to review the results.   Testing/Procedures: No labs ordered  Follow-Up: At [facility], you and your health needs are our priority.  As part of our continuing mission to provide you with exceptional heart care, we have created designated Provider Care Teams.  These Care Teams include your primary Cardiologist (physician) and Advanced Practice Providers (APPs -  Physician Assistants and Nurse Practitioners) who all work together to provide you with the care you need, when you need it.  We recommend signing up for the patient portal called "MyChart".  Sign up information is provided on this After Visit Summary.  MyChart is used to connect with patients for Virtual Visits (Telemedicine).  Patients are able to view lab/test results, encounter notes, upcoming appointments, etc.  Non-urgent messages can be sent to your provider as well.   To learn more about what you can do with MyChart, go to [email or web address].    Your next appointment:   6 month(s)  The format for your next appointment:   In Person  Provider:   Kate Sable, MD   Important Information About Sugar

## 2022-09-15 ENCOUNTER — Other Ambulatory Visit: Payer: Self-pay | Admitting: Family Medicine

## 2022-09-15 DIAGNOSIS — Z1231 Encounter for screening mammogram for malignant neoplasm of breast: Secondary | ICD-10-CM

## 2022-09-16 ENCOUNTER — Ambulatory Visit: Payer: Medicare PPO | Admitting: Physician Assistant

## 2022-09-20 ENCOUNTER — Ambulatory Visit: Payer: Medicare PPO | Admitting: Dermatology

## 2022-09-20 DIAGNOSIS — L821 Other seborrheic keratosis: Secondary | ICD-10-CM

## 2022-09-20 NOTE — Progress Notes (Signed)
   Follow-Up Visit   Subjective  Deborah Carey is a 72 y.o. female who presents for the following: Follow-up.  Patient presents for 2 month follow-up cosmetic Sks. She has noticed improvement with cryotherapy treatment, but not clear. She has other brown spots on arms/hands that she would like removed.  The following portions of the chart were reviewed this encounter and updated as appropriate:       Review of Systems:  No other skin or systemic complaints except as noted in HPI or Assessment and Plan.  Objective  Well appearing patient in no apparent distress; mood and affect are within normal limits.  A focused examination was performed including face, scalp. Relevant physical exam findings are noted in the Assessment and Plan.  L hand and wrist x 10, R hand and wrist x 5 (cosmetic tx today) (6), R cheek x 3, R forehead x 1, R eyebrow x 1, L temple x 1, L cheek x 1 (touch-up, no charge) (7) Residual waxy tan macules of the face.    Assessment & Plan  Seborrheic keratosis (13) R cheek x 3, R forehead x 1, R eyebrow x 1, L temple x 1, L cheek x 1 (touch-up, no charge) (7); L hand and wrist x 10, R hand and wrist x 5 (cosmetic tx today) (6)  Face improved s/p cosmetic cryotherapy treatment with some residual. Touch-up today, no charge.   Additional cosmetic areas treated today on the bilateral hands/wrists.   Return in about 2 months (around 11/19/2022) for f/u cosmetic SKs and hairloss.  IJamesetta Orleans, CMA, am acting as scribe for Brendolyn Patty, MD .  Documentation: I have reviewed the above documentation for accuracy and completeness, and I agree with the above.  Brendolyn Patty MD

## 2022-09-20 NOTE — Patient Instructions (Addendum)
Cryotherapy Aftercare  Wash gently with soap and water everyday.   Apply Vaseline and Band-Aid daily until healed.     Due to recent changes in healthcare laws, you may see results of your pathology and/or laboratory studies on MyChart before the doctors have had a chance to review them. We understand that in some cases there may be results that are confusing or concerning to you. Please understand that not all results are received at the same time and often the doctors may need to interpret multiple results in order to provide you with the best plan of care or course of treatment. Therefore, we ask that you please give us 2 business days to thoroughly review all your results before contacting the office for clarification. Should we see a critical lab result, you will be contacted sooner.   If You Need Anything After Your Visit  If you have any questions or concerns for your doctor, please call our main line at [phone] and press option 4 to reach your doctor's medical assistant. If no one answers, please leave a voicemail as directed and we will return your call as soon as possible. Messages left after 4 pm will be answered the following business day.   You may also send us a message via MyChart. We typically respond to MyChart messages within 1-2 business days.  For prescription refills, please ask your pharmacy to contact our office. Our fax number is [phone].  If you have an urgent issue when the clinic is closed that cannot wait until the next business day, you can page your doctor at the number below.    Please note that while we do our best to be available for urgent issues outside of office hours, we are not available 24/7.   If you have an urgent issue and are unable to reach us, you may choose to seek medical care at your doctor's office, retail clinic, urgent care center, or emergency room.  If you have a medical emergency, please immediately call 911 or go to the  emergency department.  Pager Numbers  - Dr. Kowalski: [phone]  - Dr. Moye: [phone]  - Dr. Stewart: [phone]  In the event of inclement weather, please call our main line at [phone] for an update on the status of any delays or closures.  Dermatology Medication Tips: Please keep the boxes that topical medications come in in order to help keep track of the instructions about where and how to use these. Pharmacies typically print the medication instructions only on the boxes and not directly on the medication tubes.   If your medication is too expensive, please contact our office at [phone] option 4 or send us a message through MyChart.   We are unable to tell what your co-pay for medications will be in advance as this is different depending on your insurance coverage. However, we may be able to find a substitute medication at lower cost or fill out paperwork to get insurance to cover a needed medication.   If a prior authorization is required to get your medication covered by your insurance company, please allow us 1-2 business days to complete this process.  Drug prices often vary depending on where the prescription is filled and some pharmacies may offer cheaper prices.  The website www.goodrx.com contains coupons for medications through different pharmacies. The prices here do not account for what the cost may be with help from insurance (it may be cheaper with your insurance), but the website can   give you the price if you did not use any insurance.  - You can print the associated coupon and take it with your prescription to the pharmacy.  - You may also stop by our office during regular business hours and pick up a GoodRx coupon card.  - If you need your prescription sent electronically to a different pharmacy, notify our office through [facility] MyChart or by phone at [phone] option 4.     Si Usted Necesita Algo Despus de Su Visita  Tambin puede  enviarnos un mensaje a travs de MyChart. Por lo general respondemos a los mensajes de MyChart en el transcurso de 1 a 2 das hbiles.  Para renovar recetas, por favor pida a su farmacia que se ponga en contacto con nuestra oficina. Nuestro nmero de fax es el [phone].  Si tiene un asunto urgente cuando la clnica est cerrada y que no puede esperar hasta el siguiente da hbil, puede llamar/localizar a su doctor(a) al nmero que aparece a continuacin.   Por favor, tenga en cuenta que aunque hacemos todo lo posible para estar disponibles para asuntos urgentes fuera del horario de oficina, no estamos disponibles las 24 horas del da, los 7 das de la semana.   Si tiene un problema urgente y no puede comunicarse con nosotros, puede optar por buscar atencin mdica  en el consultorio de su doctor(a), en una clnica privada, en un centro de atencin urgente o en una sala de emergencias.  Si tiene una emergencia mdica, por favor llame inmediatamente al 911 o vaya a la sala de emergencias.  Nmeros de bper  - Dr. Kowalski: [phone]  - Dra. Moye: [phone]  - Dra. Stewart: [phone]  En caso de inclemencias del tiempo, por favor llame a nuestra lnea principal al [phone] para una actualizacin sobre el estado de cualquier retraso o cierre.  Consejos para la medicacin en dermatologa: Por favor, guarde las cajas en las que vienen los medicamentos de uso tpico para ayudarle a seguir las instrucciones sobre dnde y cmo usarlos. Las farmacias generalmente imprimen las instrucciones del medicamento slo en las cajas y no directamente en los tubos del medicamento.   Si su medicamento es muy caro, por favor, pngase en contacto con nuestra oficina llamando al [phone] y presione la opcin 4 o envenos un mensaje a travs de MyChart.   No podemos decirle cul ser su copago por los medicamentos por adelantado ya que esto es diferente dependiendo de la cobertura de su seguro.  Sin embargo, es posible que podamos encontrar un medicamento sustituto a menor costo o llenar un formulario para que el seguro cubra el medicamento que se considera necesario.   Si se requiere una autorizacin previa para que su compaa de seguros cubra su medicamento, por favor permtanos de 1 a 2 das hbiles para completar este proceso.  Los precios de los medicamentos varan con frecuencia dependiendo del lugar de dnde se surte la receta y alguna farmacias pueden ofrecer precios ms baratos.  El sitio web www.goodrx.com tiene cupones para medicamentos de diferentes farmacias. Los precios aqu no tienen en cuenta lo que podra costar con la ayuda del seguro (puede ser ms barato con su seguro), pero el sitio web puede darle el precio si no utiliz ningn seguro.  - Puede imprimir el cupn correspondiente y llevarlo con su receta a la farmacia.  - Tambin puede pasar por nuestra oficina durante el horario de atencin regular y recoger una tarjeta de cupones de GoodRx.  -   Si necesita que su receta se enve electrnicamente a una farmacia diferente, informe a nuestra oficina a travs de MyChart de [place] o por telfono llamando al [phone] y presione la opcin 4.  

## 2022-09-23 ENCOUNTER — Other Ambulatory Visit: Payer: Self-pay | Admitting: Family Medicine

## 2022-09-27 ENCOUNTER — Telehealth: Payer: Self-pay | Admitting: Family Medicine

## 2022-09-27 NOTE — Telephone Encounter (Signed)
Patient dropped off document to be filled out by provider [employer]. Patient requested to send it via Call Patient to pick up within 5-days. Document is located in providers tray at front office.

## 2022-09-27 NOTE — Telephone Encounter (Signed)
Form placed in your inbox

## 2022-09-27 NOTE — Telephone Encounter (Signed)
Done and in IN box 

## 2022-09-28 NOTE — Telephone Encounter (Signed)
Left VM letting pt know form ready for pick up

## 2022-10-06 ENCOUNTER — Ambulatory Visit
Admission: RE | Admit: 2022-10-06 | Discharge: 2022-10-06 | Disposition: A | Discharge status: Home / Self Care | Payer: Medicare PPO | Source: Ambulatory Visit | Attending: Family Medicine | Admitting: Family Medicine

## 2022-10-06 DIAGNOSIS — Z1231 Encounter for screening mammogram for malignant neoplasm of breast: Secondary | ICD-10-CM

## 2022-10-19 ENCOUNTER — Telehealth: Payer: Self-pay

## 2022-10-19 NOTE — Telephone Encounter (Signed)
[facility] Night - Client Nonclinical Telephone Record  [facility] Client [facility] Night - Client Client Site [place] Provider Loura Pardon - MD Contact Type Call Who Is Calling Patient / Member / Family / Caregiver Caller Name Chelene Sferrazza Caller Phone Number [phone] Patient Name Deborah Carey Patient DOB Mar 20, 1951 Call Type Message Only Information Provided Reason for Call Request to Schedule Office Appointment Initial Comment Caller states she was in for a wellness visit last August and needs to get her appt for this year. Patient request to speak to RN No Additional Comment Office hours provided. Caller declined triage. Disp. Time Disposition Final User 10/19/2022 1:15:56 PM General Information Provided Yes Gaspar Skeeters Call Closed By: Gaspar Skeeters Transaction Date/Time: 10/19/2022 1:13:06 PM (ET  Sent to lsc support

## 2022-10-19 NOTE — Telephone Encounter (Signed)
Spoke with patient husband and he will have her call back to schedule.

## 2022-10-20 ENCOUNTER — Other Ambulatory Visit: Payer: Self-pay | Admitting: Family Medicine

## 2022-11-03 DIAGNOSIS — Z961 Presence of intraocular lens: Secondary | ICD-10-CM | POA: Diagnosis not present

## 2022-11-03 DIAGNOSIS — H43813 Vitreous degeneration, bilateral: Secondary | ICD-10-CM | POA: Diagnosis not present

## 2022-11-03 DIAGNOSIS — H04123 Dry eye syndrome of bilateral lacrimal glands: Secondary | ICD-10-CM | POA: Diagnosis not present

## 2022-11-12 ENCOUNTER — Encounter: Payer: Self-pay | Admitting: Pulmonary Disease

## 2022-11-14 ENCOUNTER — Ambulatory Visit
Admission: RE | Admit: 2022-11-14 | Discharge: 2022-11-14 | Disposition: A | Discharge status: Home / Self Care | Payer: Medicare PPO | Source: Ambulatory Visit | Attending: Medical Oncology | Admitting: Medical Oncology

## 2022-11-14 DIAGNOSIS — R918 Other nonspecific abnormal finding of lung field: Secondary | ICD-10-CM

## 2022-11-16 ENCOUNTER — Other Ambulatory Visit: Payer: Self-pay

## 2022-11-16 DIAGNOSIS — R918 Other nonspecific abnormal finding of lung field: Secondary | ICD-10-CM

## 2022-11-21 ENCOUNTER — Other Ambulatory Visit: Payer: Self-pay | Admitting: Family Medicine

## 2022-11-22 ENCOUNTER — Ambulatory Visit: Payer: Medicare PPO | Admitting: Dermatology

## 2022-11-22 NOTE — Telephone Encounter (Signed)
Last filled on 05/19/22 #30 tabs/ 5 refills, CPE scheduled 05/12/23

## 2022-11-28 ENCOUNTER — Ambulatory Visit: Payer: Medicare PPO | Admitting: Pulmonary Disease

## 2023-03-08 ENCOUNTER — Other Ambulatory Visit: Payer: Self-pay | Admitting: Family Medicine

## 2023-04-20 ENCOUNTER — Encounter (INDEPENDENT_AMBULATORY_CARE_PROVIDER_SITE_OTHER): Payer: Self-pay

## 2023-05-01 ENCOUNTER — Telehealth (INDEPENDENT_AMBULATORY_CARE_PROVIDER_SITE_OTHER): Payer: Medicare PPO | Admitting: Family Medicine

## 2023-05-01 ENCOUNTER — Other Ambulatory Visit (INDEPENDENT_AMBULATORY_CARE_PROVIDER_SITE_OTHER): Payer: Medicare PPO

## 2023-05-01 DIAGNOSIS — R7303 Prediabetes: Secondary | ICD-10-CM

## 2023-05-01 DIAGNOSIS — E785 Hyperlipidemia, unspecified: Secondary | ICD-10-CM | POA: Diagnosis not present

## 2023-05-01 DIAGNOSIS — E049 Nontoxic goiter, unspecified: Secondary | ICD-10-CM | POA: Diagnosis not present

## 2023-05-01 DIAGNOSIS — Z79899 Other long term (current) drug therapy: Secondary | ICD-10-CM

## 2023-05-01 DIAGNOSIS — I4821 Permanent atrial fibrillation: Secondary | ICD-10-CM

## 2023-05-01 DIAGNOSIS — E559 Vitamin D deficiency, unspecified: Secondary | ICD-10-CM | POA: Diagnosis not present

## 2023-05-01 DIAGNOSIS — Z8673 Personal history of transient ischemic attack (TIA), and cerebral infarction without residual deficits: Secondary | ICD-10-CM

## 2023-05-01 LAB — LIPID PANEL
Cholesterol: 106 mg/dL (ref 0–200)
HDL: 39.1 mg/dL (ref >39.00)
LDL Cholesterol: 52 mg/dL (ref 0–99)
NonHDL: 66.93
Total CHOL/HDL Ratio: 3
Triglycerides: 73 mg/dL (ref 0.0–149.0)
VLDL: 14.6 mg/dL (ref 0.0–40.0)

## 2023-05-01 LAB — VITAMIN B12: Vitamin B-12: 329 pg/mL (ref 211–911)

## 2023-05-01 LAB — CBC WITH DIFFERENTIAL/PLATELET
Basophils Absolute: 0.1 10*3/uL (ref 0.0–0.1)
Basophils Relative: 0.7 % (ref 0.0–3.0)
Eosinophils Absolute: 0.2 10*3/uL (ref 0.0–0.7)
Eosinophils Relative: 2.6 % (ref 0.0–5.0)
HCT: 41.1 % (ref 36.0–46.0)
Hemoglobin: 13.8 g/dL (ref 12.0–15.0)
Lymphocytes Relative: 28.6 % (ref 12.0–46.0)
Lymphs Abs: 2 10*3/uL (ref 0.7–4.0)
MCHC: 33.5 g/dL (ref 30.0–36.0)
MCV: 95.8 fl (ref 78.0–100.0)
Monocytes Absolute: 0.6 10*3/uL (ref 0.1–1.0)
Monocytes Relative: 8.4 % (ref 3.0–12.0)
Neutro Abs: 4.1 10*3/uL (ref 1.4–7.7)
Neutrophils Relative %: 59.7 % (ref 43.0–77.0)
Platelets: 206 10*3/uL (ref 150.0–400.0)
RBC: 4.3 Mil/uL (ref 3.87–5.11)
RDW: 12.7 % (ref 11.5–15.5)
WBC: 6.8 10*3/uL (ref 4.0–10.5)

## 2023-05-01 LAB — COMPREHENSIVE METABOLIC PANEL
ALT: 13 U/L (ref 0–35)
AST: 14 U/L (ref 0–37)
Albumin: 3.9 g/dL (ref 3.5–5.2)
Alkaline Phosphatase: 81 U/L (ref 39–117)
BUN: 17 mg/dL (ref 6–23)
CO2: 31 mEq/L (ref 19–32)
Calcium: 9.2 mg/dL (ref 8.4–10.5)
Chloride: 104 mEq/L (ref 96–112)
Creatinine, Ser: 0.82 mg/dL (ref 0.40–1.20)
GFR: 70.35 mL/min (ref >60.00)
Glucose, Bld: 106 mg/dL — ABNORMAL HIGH (ref 70–99)
Potassium: 4.3 mEq/L (ref 3.5–5.1)
Sodium: 141 mEq/L (ref 135–145)
Total Bilirubin: 0.7 mg/dL (ref 0.2–1.2)
Total Protein: 6.1 g/dL (ref 6.0–8.3)

## 2023-05-01 LAB — HEMOGLOBIN A1C: Hgb A1c MFr Bld: 5.9 % (ref 4.6–6.5)

## 2023-05-01 LAB — VITAMIN D 25 HYDROXY (VIT D DEFICIENCY, FRACTURES): VITD: 57.93 ng/mL (ref 30.00–100.00)

## 2023-05-01 LAB — TSH: TSH: 2.34 u[IU]/mL (ref 0.35–5.50)

## 2023-05-01 NOTE — Telephone Encounter (Signed)
-----   Message from [place] sent at 05/01/2023  8:44 AM EDT ----- Good Morning,  This patient come by this morning for CPE. Labs can I get orders please.   Thanks

## 2023-05-05 ENCOUNTER — Other Ambulatory Visit: Payer: Medicare PPO

## 2023-05-08 ENCOUNTER — Other Ambulatory Visit: Payer: Self-pay | Admitting: Family Medicine

## 2023-05-10 ENCOUNTER — Ambulatory Visit (INDEPENDENT_AMBULATORY_CARE_PROVIDER_SITE_OTHER): Payer: Medicare PPO

## 2023-05-10 VITALS — Ht 63.5 in | Wt 185.0 lb

## 2023-05-10 DIAGNOSIS — Z0000 Encounter for general adult medical examination without abnormal findings: Secondary | ICD-10-CM | POA: Diagnosis not present

## 2023-05-10 DIAGNOSIS — Z780 Asymptomatic menopausal state: Secondary | ICD-10-CM

## 2023-05-10 NOTE — Patient Instructions (Addendum)
Deborah Carey , Thank you for taking time to come for your Medicare Wellness Visit. I appreciate your ongoing commitment to your health goals. Please review the following plan we discussed and let me know if I can assist you in the future.   Referrals/Orders/Follow-Ups/Clinician Recommendations: Aim for 30 minutes of exercise or brisk walking, 6-8 glasses of water, and 5 servings of fruits and vegetables each day.   You have an order for:  []   2D Mammogram  []   3D Mammogram  [x]   Bone Density     Please call for appointment:  [facility]  [address] [place] [address] [phone]  [facility] [address] [place], [place] [address] [phone]  [facility] [address] [place], [place] [address] [phone]    Make sure to wear two-piece clothing.  No lotions, powders, or deodorants the day of the appointment. Make sure to bring picture ID and insurance card.  Bring list of medications you are currently taking including any supplements.   Schedule your [place] screening mammogram through MyChart!   Log into your MyChart account.  Go to 'Visit' (or 'Appointments' if on mobile App) --> Schedule an Appointment  Under 'Select a Reason for Visit' choose the Mammogram Screening option.  Complete the pre-visit questions and select the time and place that best fits your schedule.    This is a list of the screening recommended for you and due dates:  Health Maintenance  Topic Date Due   Zoster (Shingles) Vaccine (1 of 2) Never done   DTaP/Tdap/Td vaccine (3 - Td or Tdap) 11/06/2022   Flu Shot  04/06/2023   Medicare Annual Wellness Visit  05/04/2023   COVID-19 Vaccine (4 - 2023-24 season) 05/07/2023   Mammogram  10/07/2023   Colon Cancer Screening  06/27/2026   Pneumonia Vaccine  Completed   DEXA scan (bone density  measurement)  Completed   Hepatitis C Screening  Completed   HPV Vaccine  Aged Out    Advanced directives: (In Chart) A copy of your advanced directives are scanned into your chart should your provider ever need it.  Next Medicare Annual Wellness Visit scheduled for next year: Yes

## 2023-05-10 NOTE — Progress Notes (Signed)
Subjective:   Deborah Carey is a 72 y.o. female who presents for Medicare Annual (Subsequent) preventive examination.  Visit Complete: Virtual  I connected with  Deborah Carey on 05/10/23 by a audio enabled telemedicine application and verified that I am speaking with the correct person using two identifiers.  Patient Location: Home  Provider Location: Office/Clinic  I discussed the limitations of evaluation and management by telemedicine. The patient expressed understanding and agreed to proceed.  Vital Signs: Because this visit was a virtual/telehealth visit, some criteria may be missing or patient reported. Any vitals not documented were not able to be obtained and vitals that have been documented are patient reported.    Review of Systems      Cardiac Risk Factors include: advanced age (>61men, >63 women);obesity (BMI >30kg/m2);sedentary lifestyle;dyslipidemia     Objective:    Today's Vitals   05/10/23 1300  Weight: 185 lb (83.9 kg)  Height: 5' 3.5" (1.613 m)   Body mass index is 32.26 kg/m.     05/10/2023    1:06 PM 06/16/2021    9:15 AM 06/14/2021   10:04 AM 06/01/2021    6:33 PM 06/01/2021    3:50 PM 05/20/2020    2:45 PM 05/10/2019    3:37 PM  Advanced Directives  Does Patient Have a Medical Advance Directive? Yes Yes Yes  Yes Yes Yes  Type of Estate agent of [place];Living will Healthcare Power of [place];Living will Healthcare Power of [place];Living will  Healthcare Power of [place];Living will Healthcare Power of [place];Living will Healthcare Power of [place];Living will  Does patient want to make changes to medical advance directive? No - Patient declined No - Patient declined No - Patient declined No - Patient declined     Copy of Healthcare Power of Attorney in Chart? Yes - validated most recent copy scanned in chart (See row information) No - copy requested No - copy requested  No - copy requested No - copy requested No - copy  requested    Current Medications (verified) Outpatient Encounter Medications as of 05/10/2023  Medication Sig   acetaminophen (TYLENOL) 325 MG tablet Take 1-2 tablets (325-650 mg total) by mouth every 4 (four) hours as needed for mild pain.   albuterol (VENTOLIN HFA) 108 (90 Base) MCG/ACT inhaler INHALE 2 PUFFS INTO THE LUNGS EVERY 4 (FOUR) HOURS AS NEEDED.   atorvastatin (LIPITOR) 40 MG tablet TAKE ONE TABLET BY MOUTH DAILY   ELIQUIS 5 MG TABS tablet TAKE ONE TABLET BY MOUTH TWICE DAILY   metoprolol succinate (TOPROL-XL) 25 MG 24 hr tablet TAKE ONE TABLET BY MOUTH DAILY   mometasone (ELOCON) 0.1 % cream Apply 1 Application topically daily as needed (Rash). Qd to bid up to 5 days a week aa itchy rash on upper back until clear, then as needed for flares   pantoprazole (PROTONIX) 40 MG tablet TAKE 1 TABLET BY MOUTH DAILY FOR REFLUX-CAN USE AS NEEDED IF SYMPTOMS CONTROLLED   traZODone (DESYREL) 50 MG tablet TAKE 1/2 TO 1 TABLET BY MOUTH AT BEDTIMEAS NEEDED FOR SLEEP   tretinoin (RETIN-A) 0.025 % cream Apply topically at bedtime.   No facility-administered encounter medications on file as of 05/10/2023.    Allergies (verified) Patient has no known allergies.   History: Past Medical History:  Diagnosis Date   Allergy    allergic rhinitis   Anxiety    Cataract    removed both eyes    Infertility, female    Osteoarthritis of both  knees    OA    PONV (postoperative nausea and vomiting)    Post-menopausal bleeding    neg endo bx.   Past Surgical History:  Procedure Laterality Date   cataract surgery  2011   CESAREAN SECTION     times 2   COLONOSCOPY  2010   hems    ENDOMETRIAL BIOPSY     neg for CA cells   FACIAL COSMETIC SURGERY  2006   face lift   FOOT SURGERY  1998   rt heel spur removal   IR CT HEAD LTD  05/25/2021   IR PERCUTANEOUS ART THROMBECTOMY/INFUSION INTRACRANIAL INC DIAG ANGIO  05/25/2021   IR US GUIDE VASC ACCESS RIGHT  05/25/2021   RADIOLOGY WITH ANESTHESIA N/A  05/25/2021   Procedure: IR WITH ANESTHESIA;  Surgeon: Deborah Lack, MD;  Location: [facility] OR;  Service: Radiology;  Laterality: N/A;   REFRACTIVE SURGERY  08/1999   Family History  Problem Relation Age of Onset   COPD Brother    Stroke Mother    Hypertension Mother    Osteoporosis Mother    Alzheimer's disease Father    Hypertension Father    Osteoporosis Father    Depression Sister    Hypertension Sister    Osteoporosis Sister    Thyroid disease Sister    Hypothyroidism Sister    Parkinson's disease Sister    Osteoporosis Sister    Hypothyroidism Sister    Breast cancer Neg Hx    Colon polyps Neg Hx    Colon cancer Neg Hx    Esophageal cancer Neg Hx    Rectal cancer Neg Hx    Stomach cancer Neg Hx    Social History   Socioeconomic History   Marital status: Married    Spouse name: Not on file   Number of children: Not on file   Years of education: Not on file   Highest education level: Not on file  Occupational History   Occupation: retired  Tobacco Use   Smoking status: Never   Smokeless tobacco: Never  Vaping Use   Vaping status: Never Used  Substance and Sexual Activity   Alcohol use: No    Alcohol/week: 0.0 standard drinks of alcohol   Drug use: No   Sexual activity: Yes    Partners: Male    Birth control/protection: Post-menopausal  Other Topics Concern   Not on file  Social History Narrative   Not on file   Social Determinants of Health   Financial Resource Strain: Low Risk  (05/10/2023)   Overall Financial Resource Strain (CARDIA)    Difficulty of Paying Living Expenses: Not hard at all  Food Insecurity: No Food Insecurity (05/10/2023)   Hunger Vital Sign    Worried About Running Out of Food in the Last Year: Never true    Ran Out of Food in the Last Year: Never true  Transportation Needs: No Transportation Needs (05/10/2023)   PRAPARE - Administrator, Civil Service (Medical): No    [place] of Transportation (Non-Medical): No  Physical  Activity: Insufficiently Active (05/10/2023)   Exercise Vital Sign    Days of Exercise per Week: 3 days    Minutes of Exercise per Session: 20 min  Stress: No Stress Concern Present (05/10/2023)   [employer] of Occupational Health - Occupational Stress Questionnaire    Feeling of Stress : Not at all  Social Connections: Moderately Integrated (05/10/2023)   Social Connection and Isolation Panel [NHANES]    Frequency  of Communication with Friends and Family: More than three times a week    Frequency of Social Gatherings with Friends and Family: More than three times a week    Attends Religious Services: More than 4 times per year    Active Member of [employer] or Organizations: No    Attends Engineer, structural: Never    Marital Status: Married    Tobacco Counseling Counseling given: Not Answered   Clinical Intake:  Pre-visit preparation completed: Yes  Pain : No/denies pain     BMI - recorded: 32.26 Nutritional Status: BMI > 30  Obese Nutritional Risks: None Diabetes: No  How often do you need to have someone help you when you read instructions, pamphlets, or other written materials from your doctor or pharmacy?: 1 - Never  Interpreter Needed?: No  Information entered by :: C.Ileigh Mettler LPN   Activities of Daily Living    05/10/2023    1:07 PM  In your present state of health, do you have any difficulty performing the following activities:  Hearing? 0  Vision? 0  Difficulty concentrating or making decisions? 0  Walking or climbing stairs? 0  Dressing or bathing? 0  Doing errands, shopping? 0  Preparing Food and eating ? N  Using the Toilet? N  In the past six months, have you accidently leaked urine? N  Do you have problems with loss of bowel control? N  Managing your Medications? N  Managing your Finances? N  Housekeeping or managing your Housekeeping? N    Patient Care Team: Tower, Audrie Gallus, MD as PCP - General Debbe Odea, MD as PCP -  Cardiology (Cardiology) Verner Chol, CNM as Referring Physician (Certified Nurse Midwife) Buck Mam, DDS as Referring Physician (Dentistry)  Indicate any recent Medical Services you may have received from other than [facility] providers in the past year (date may be approximate).     Assessment:   This is a routine wellness examination for Deborah Carey.  Hearing/Vision screen Hearing Screening - Comments:: Denies hearing difficulties   Vision Screening - Comments:: No vision issues - UTD on eye exams at [facility]  Dietary issues and exercise activities discussed:     Goals Addressed             This Visit's Progress    Patient Stated       Lose 30 pounds       Depression Screen    05/10/2023    1:03 PM 05/03/2022    9:37 AM 06/17/2021    9:00 AM 05/20/2020    2:47 PM 05/10/2019    3:38 PM 05/03/2018   11:00 AM 05/01/2017    9:57 AM  PHQ 2/9 Scores  PHQ - 2 Score 0 2 0 1 4 0 0  PHQ- 9 Score  6  3 7  0 4    Fall Risk    05/10/2023    1:07 PM 05/03/2022    9:40 AM 06/17/2021    9:00 AM 05/20/2020    2:46 PM 05/10/2019    3:38 PM  Fall Risk   Falls in the past year? 0 1 0 0 0  Number falls in past yr: 0 1 0 0   Injury with Fall? 0 0 0 0   Risk for fall due to : No Fall Risks No Fall Risks;Impaired balance/gait  No Fall Risks   Follow up Falls prevention discussed;Falls evaluation completed Falls evaluation completed  Falls evaluation completed;Falls prevention discussed Falls evaluation completed;Falls prevention  discussed    MEDICARE RISK AT HOME: Medicare Risk at Home Any stairs in or around the home?: Yes If so, are there any without handrails?: No Home free of loose throw rugs in walkways, pet beds, electrical cords, etc?: Yes Adequate lighting in your home to reduce risk of falls?: Yes Life alert?: No Use of a cane, walker or w/c?: No Grab bars in the bathroom?: No Shower chair or bench in shower?: Yes Elevated toilet seat or a handicapped toilet?: Yes  TIMED  UP AND GO:  Was the test performed?  No    Cognitive Function:    05/20/2020    2:51 PM 05/10/2019    3:41 PM 05/03/2018   11:00 AM 05/01/2017    9:58 AM 04/29/2016    8:50 AM  MMSE - Mini Mental State Exam  Orientation to time 5 4 5 5 5   Orientation to Place 5 5 5 5 5   Registration 3 3 3 3 3   Attention/ Calculation 5 5 0 0 0  Recall 3 3 3 3 3   Language- name 2 objects  0 0 0 0  Language- repeat 1 1 1 1 1   Language- follow 3 step command  0 3 3 3   Language- read & follow direction  0 0 0 0  Write a sentence  0 0 0 0  Copy design  0 0 0 0  Total score  21 20 20 20         05/10/2023    1:08 PM  6CIT Screen  What Year? 0 points  What month? 0 points  What time? 0 points  Count back from 20 0 points  Months in reverse 0 points  Repeat phrase 0 points  Total Score 0 points    Immunizations Immunization History  Administered Date(s) Administered   Fluad Quad(high Dose 65+) 05/14/2019, 05/29/2020   Influenza Split 09/27/2011   Influenza, High Dose Seasonal PF 07/18/2017   Influenza,inj,Quad PF,6+ Mos 09/09/2015, 04/29/2016, 05/08/2018   PFIZER Comirnaty(Gray Top)Covid-19 Tri-Sucrose Vaccine 10/10/2019, 10/31/2019   PFIZER(Purple Top)SARS-COV-2 Vaccination 07/27/2020   Pneumococcal Conjugate-13 04/28/2015   Pneumococcal Polysaccharide-23 11/29/2013   Td 01/21/2003   Tdap 11/05/2012    TDAP status: Due, Education has been provided regarding the importance of this vaccine. Advised may receive this vaccine at local pharmacy or Health Dept. Aware to provide a copy of the vaccination record if obtained from local pharmacy or Health Dept. Verbalized acceptance and understanding.  Flu Vaccine status: Due, Education has been provided regarding the importance of this vaccine. Advised may receive this vaccine at local pharmacy or Health Dept. Aware to provide a copy of the vaccination record if obtained from local pharmacy or Health Dept. Verbalized acceptance and  understanding.  Pneumococcal vaccine status: Up to date  Covid-19 vaccine status: Information provided on how to obtain vaccines.   Qualifies for Shingles Vaccine? Yes   Zostavax completed      Shingrix Completed?: No.    Education has been provided regarding the importance of this vaccine. Patient has been advised to call insurance company to determine out of pocket expense if they have not yet received this vaccine. Advised may also receive vaccine at local pharmacy or Health Dept. Verbalized acceptance and understanding.  Screening Tests Health Maintenance  Topic Date Due   Zoster Vaccines- Shingrix (1 of 2) Never done   DTaP/Tdap/Td (3 - Td or Tdap) 11/06/2022   INFLUENZA VACCINE  04/06/2023   COVID-19 Vaccine (4 - 2023-24 season) 05/07/2023  MAMMOGRAM  10/07/2023   Medicare Annual Wellness (AWV)  05/09/2024   Colonoscopy  06/27/2026   Pneumonia Vaccine 46+ Years old  Completed   DEXA SCAN  Completed   Hepatitis C Screening  Completed   HPV VACCINES  Aged Out    Health Maintenance  Health Maintenance Due  Topic Date Due   Zoster Vaccines- Shingrix (1 of 2) Never done   DTaP/Tdap/Td (3 - Td or Tdap) 11/06/2022   INFLUENZA VACCINE  04/06/2023   COVID-19 Vaccine (4 - 2023-24 season) 05/07/2023    Colorectal cancer screening: No longer required.   Mammogram status: Completed 10/06/22. Repeat every year  Bone Density status: Ordered 05/10/23. Pt provided with contact info and advised to call to schedule appt.  Lung Cancer Screening: (Low Dose CT Chest recommended if Age 44-80 years, 20 pack-year currently smoking OR have quit w/in 15years.) does not qualify.   Lung Cancer Screening Referral:    Additional Screening:  Hepatitis C Screening: does qualify; Completed 04/29/16  Vision Screening: Recommended annual ophthalmology exams for early detection of glaucoma and other disorders of the eye. Is the patient up to date with their annual eye exam?  Yes  Who is the  provider or what is the name of the office in which the patient attends annual eye exams? [facility] If pt is not established with a provider, would they like to be referred to a provider to establish care? Yes .   Dental Screening: Recommended annual dental exams for proper oral hygiene  Diabetic Foot Exam:   Community Resource Referral / Chronic Care Management: CRR required this visit?  No   CCM required this visit?  No     Plan:     I have personally reviewed and noted the following in the patient's chart:   Medical and social history Use of alcohol, tobacco or illicit drugs  Current medications and supplements including opioid prescriptions. Patient is not currently taking opioid prescriptions. Functional ability and status Nutritional status Physical activity Advanced directives List of other physicians Hospitalizations, surgeries, and ER visits in previous 12 months Vitals Screenings to include cognitive, depression, and falls Referrals and appointments  In addition, I have reviewed and discussed with patient certain preventive protocols, quality metrics, and best practice recommendations. A written personalized care plan for preventive services as well as general preventive health recommendations were provided to patient.     Maryan Puls, LPN   4/0/9811   After Visit Summary: (MyChart) Due to this being a telephonic visit, the after visit summary with patients personalized plan was offered to patient via MyChart   Nurse Notes: None

## 2023-05-10 NOTE — Telephone Encounter (Signed)
Last filled on 11/22/22 #30 tabs/ 6 refill, ? If to soon

## 2023-05-12 ENCOUNTER — Encounter: Payer: Self-pay | Admitting: Family Medicine

## 2023-05-12 ENCOUNTER — Ambulatory Visit (INDEPENDENT_AMBULATORY_CARE_PROVIDER_SITE_OTHER): Payer: Medicare PPO | Admitting: Family Medicine

## 2023-05-12 VITALS — BP 130/80 | HR 58 | Temp 97.9°F | Ht 63.0 in | Wt 183.4 lb

## 2023-05-12 DIAGNOSIS — Z0000 Encounter for general adult medical examination without abnormal findings: Secondary | ICD-10-CM

## 2023-05-12 DIAGNOSIS — E669 Obesity, unspecified: Secondary | ICD-10-CM

## 2023-05-12 DIAGNOSIS — R7303 Prediabetes: Secondary | ICD-10-CM

## 2023-05-12 DIAGNOSIS — Z8673 Personal history of transient ischemic attack (TIA), and cerebral infarction without residual deficits: Secondary | ICD-10-CM

## 2023-05-12 DIAGNOSIS — M170 Bilateral primary osteoarthritis of knee: Secondary | ICD-10-CM

## 2023-05-12 DIAGNOSIS — K219 Gastro-esophageal reflux disease without esophagitis: Secondary | ICD-10-CM

## 2023-05-12 DIAGNOSIS — I4821 Permanent atrial fibrillation: Secondary | ICD-10-CM

## 2023-05-12 DIAGNOSIS — Z23 Encounter for immunization: Secondary | ICD-10-CM | POA: Diagnosis not present

## 2023-05-12 DIAGNOSIS — E785 Hyperlipidemia, unspecified: Secondary | ICD-10-CM

## 2023-05-12 DIAGNOSIS — E559 Vitamin D deficiency, unspecified: Secondary | ICD-10-CM | POA: Diagnosis not present

## 2023-05-12 DIAGNOSIS — E041 Nontoxic single thyroid nodule: Secondary | ICD-10-CM

## 2023-05-12 DIAGNOSIS — R911 Solitary pulmonary nodule: Secondary | ICD-10-CM

## 2023-05-12 DIAGNOSIS — G479 Sleep disorder, unspecified: Secondary | ICD-10-CM | POA: Diagnosis not present

## 2023-05-12 DIAGNOSIS — Z1211 Encounter for screening for malignant neoplasm of colon: Secondary | ICD-10-CM

## 2023-05-12 DIAGNOSIS — M8589 Other specified disorders of bone density and structure, multiple sites: Secondary | ICD-10-CM

## 2023-05-12 DIAGNOSIS — K8020 Calculus of gallbladder without cholecystitis without obstruction: Secondary | ICD-10-CM

## 2023-05-12 MED ORDER — METOPROLOL SUCCINATE ER 25 MG PO TB24: 25.0000 mg | ORAL_TABLET | Freq: Every day | ORAL | 3 refills | Status: DC

## 2023-05-12 MED ORDER — ATORVASTATIN CALCIUM 40 MG PO TABS: 40.0000 mg | ORAL_TABLET | Freq: Every day | ORAL | 3 refills | Status: DC

## 2023-05-12 NOTE — Progress Notes (Signed)
Subjective:    Patient ID: Deborah Carey, female    DOB: 1950/12/01, 72 y.o.   MRN: [id number]  HPI  Here for health maintenance exam and to review chronic medical problems   Wt Readings from Last 3 Encounters:  05/12/23 183 lb 6 oz (83.2 kg)  05/10/23 185 lb (83.9 kg)  09/06/22 181 lb 12.8 oz (82.5 kg)   32.48 kg/m  Vitals:   05/12/23 1101 05/12/23 1127  BP: (!) 144/90 130/80  Pulse: (!) 58   Temp: 97.9 F (36.6 C)   SpO2: 98%     Immunization History  Administered Date(s) Administered   Fluad Quad(high Dose 65+) 05/14/2019, 05/29/2020   Fluad Trivalent(High Dose 65+) 05/12/2023   Influenza Split 09/27/2011   Influenza, High Dose Seasonal PF 07/18/2017   Influenza,inj,Quad PF,6+ Mos 09/09/2015, 04/29/2016, 05/08/2018   PFIZER Comirnaty(Gray Top)Covid-19 Tri-Sucrose Vaccine 10/10/2019, 10/31/2019   PFIZER(Purple Top)SARS-COV-2 Vaccination 07/27/2020   Pneumococcal Conjugate-13 04/28/2015   Pneumococcal Polysaccharide-23 11/29/2013   Td 01/21/2003   Tdap 11/05/2012    Health Maintenance Due  Topic Date Due   DTaP/Tdap/Td (3 - Td or Tdap) 11/06/2022   Feeling ok  [place]    Flu shot - today   Shingrix- not sure yet   Tetanus shot - will inq at pharm   Mammogram due in February  Self breast exam: no lumps   Gyn health-no problems    Colon cancer screening 06/2019 with 7 year recall if applicable   Bone health  Dexa  05/2023 osteopenia  Falls-none  Fractures-none  Supplements  Last vitamin D Lab Results  Component Value Date   VD25OH 57.93 05/01/2023    Exercise : not much due to a lot of orthopedic issues     Mood    05/10/2023    1:03 PM 05/03/2022    9:37 AM 06/17/2021    9:00 AM 05/20/2020    2:47 PM 05/10/2019    3:38 PM  Depression screen PHQ 2/9  Decreased Interest 0 1 0 0 1  Down, Depressed, Hopeless 0 1 0 1 3  PHQ - 2 Score 0 2 0 1 4  Altered sleeping  3  2 3   Tired, decreased energy  1  0 0   Change in appetite  0  0 0  Feeling bad or failure about yourself   0  0 0  Trouble concentrating  0  0 0  Moving slowly or fidgety/restless  0  0 0  Suicidal thoughts  0  0 0  PHQ-9 Score  6  3 7   Difficult doing work/chores  Extremely dIfficult  Not difficult at all Not difficult at all   Past history of cva and a fib  BP Readings from Last 3 Encounters:  05/12/23 130/80  09/06/22 118/72  07/01/22 (!) 150/80   Pulse Readings from Last 3 Encounters:  05/12/23 (!) 58  09/06/22 71  07/01/22 71   Toprol xl 25 mg daily  Eliquis  Stays in a fib  Does not bother her   History of lung nodules  Had CT in march -stable   GERD Protonix 40 mg prn - she stopped it  Takes tums every night , takes care of it  Lab Results  Component Value Date   VITAMINB12 329 05/01/2023   History of thyroid nodule Lab Results  Component Value Date   TSH 2.34 05/01/2023   Prediabetes Lab Results  Component Value Date  HGBA1C 5.9 05/01/2023   Is working on eating healthy This is improved     Hyperlipidemia Lab Results  Component Value Date   CHOL 106 05/01/2023   CHOL 113 05/04/2022   CHOL 122 09/07/2021   Lab Results  Component Value Date   HDL 39.10 05/01/2023   HDL 44.80 05/04/2022   HDL 39.80 09/07/2021   Lab Results  Component Value Date   LDLCALC 52 05/01/2023   LDLCALC 54 05/04/2022   LDLCALC 62 09/07/2021   Lab Results  Component Value Date   TRIG 73.0 05/01/2023   TRIG 67.0 05/04/2022   TRIG 101.0 09/07/2021   Lab Results  Component Value Date   CHOLHDL 3 05/01/2023   CHOLHDL 3 05/04/2022   CHOLHDL 3 09/07/2021   Lab Results  Component Value Date   LDLDIRECT 141.8 10/30/2013   Atorvastatin 40 mg daily   Lab Results  Component Value Date   NA 141 05/01/2023   K 4.3 05/01/2023   CO2 31 05/01/2023   GLUCOSE 106 (H) 05/01/2023   BUN 17 05/01/2023   CREATININE 0.82 05/01/2023   CALCIUM 9.2 05/01/2023   GFR 70.35 05/01/2023   GFRNONAA >60  06/07/2021   Lab Results  Component Value Date   ALT 13 05/01/2023   AST 14 05/01/2023   ALKPHOS 81 05/01/2023   BILITOT 0.7 05/01/2023   Lab Results  Component Value Date   WBC 6.8 05/01/2023   HGB 13.8 05/01/2023   HCT 41.1 05/01/2023   MCV 95.8 05/01/2023   PLT 206.0 05/01/2023      Patient Active Problem List   Diagnosis Date Noted   Sleep disorder 05/12/2023   GERD (gastroesophageal reflux disease) 05/04/2022   Current use of proton pump inhibitor 05/04/2022   Skin spots-aging 05/04/2022   Right shoulder pain 01/04/2022   Chronic ischemic left MCA stroke 06/18/2021   Cholelithiasis 06/07/2021   Nodule of apex of right lung 06/07/2021   History of CVA (cerebrovascular accident) 05/25/2021   Atrial fibrillation (HCC)    Colon cancer screening 05/14/2019   Vitamin D deficiency 05/08/2018   Flushing 06/20/2017   H/O fracture of fibula 01/21/2014   Osteopenia 01/07/2014   Family history of osteoporosis 11/29/2013   Estrogen deficiency 11/29/2013   Hyperlipidemia 11/29/2013   Encounter for Medicare annual wellness exam 10/29/2013   Left thyroid nodule 09/26/2013   Enlarged thyroid 09/16/2013   Primary osteoarthritis of both knees 11/05/2012   Other screening mammogram 09/27/2011   Routine general medical examination at a health care facility 09/19/2011   Obesity (BMI 30-39.9) 06/06/2008   Prediabetes 06/06/2008   Past Medical History:  Diagnosis Date   Allergy    allergic rhinitis   Anxiety    Cataract    removed both eyes    Infertility, female    Osteoarthritis of both knees    OA    PONV (postoperative nausea and vomiting)    Post-menopausal bleeding    neg endo bx.   Past Surgical History:  Procedure Laterality Date   cataract surgery  2011   CESAREAN SECTION     times 2   COLONOSCOPY  2010   hems    ENDOMETRIAL BIOPSY     neg for CA cells   FACIAL COSMETIC SURGERY  2006   face lift   FOOT SURGERY  1998   rt heel spur removal   IR CT HEAD  LTD  05/25/2021   IR PERCUTANEOUS ART THROMBECTOMY/INFUSION INTRACRANIAL INC DIAG ANGIO  05/25/2021  IR US GUIDE VASC ACCESS RIGHT  05/25/2021   RADIOLOGY WITH ANESTHESIA N/A 05/25/2021   Procedure: IR WITH ANESTHESIA;  Surgeon: Irish Lack, MD;  Location: [facility] OR;  Service: Radiology;  Laterality: N/A;   REFRACTIVE SURGERY  08/1999   Social History   Tobacco Use   Smoking status: Never   Smokeless tobacco: Never  Vaping Use   Vaping status: Never Used  Substance Use Topics   Alcohol use: No    Alcohol/week: 0.0 standard drinks of alcohol   Drug use: No   Family History  Problem Relation Age of Onset   COPD Brother    Stroke Mother    Hypertension Mother    Osteoporosis Mother    Alzheimer's disease Father    Hypertension Father    Osteoporosis Father    Depression Sister    Hypertension Sister    Osteoporosis Sister    Thyroid disease Sister    Hypothyroidism Sister    Parkinson's disease Sister    Osteoporosis Sister    Hypothyroidism Sister    Breast cancer Neg Hx    Colon polyps Neg Hx    Colon cancer Neg Hx    Esophageal cancer Neg Hx    Rectal cancer Neg Hx    Stomach cancer Neg Hx    No Known Allergies Current Outpatient Medications on File Prior to Visit  Medication Sig Dispense Refill   acetaminophen (TYLENOL) 325 MG tablet Take 1-2 tablets (325-650 mg total) by mouth every 4 (four) hours as needed for mild pain.     albuterol (VENTOLIN HFA) 108 (90 Base) MCG/ACT inhaler INHALE 2 PUFFS INTO THE LUNGS EVERY 4 (FOUR) HOURS AS NEEDED. 1 each 5   ELIQUIS 5 MG TABS tablet TAKE ONE TABLET BY MOUTH TWICE DAILY 180 tablet 1   mometasone (ELOCON) 0.1 % cream Apply 1 Application topically daily as needed (Rash). Qd to bid up to 5 days a week aa itchy rash on upper back until clear, then as needed for flares 50 g 0   pantoprazole (PROTONIX) 40 MG tablet TAKE 1 TABLET BY MOUTH DAILY FOR REFLUX-CAN USE AS NEEDED IF SYMPTOMS CONTROLLED 90 tablet 0   traZODone (DESYREL) 50  MG tablet TAKE 1/2 TO 1 TABLET BY MOUTH AT BEDTIMEAS NEEDED FOR SLEEP 30 tablet 5   tretinoin (RETIN-A) 0.025 % cream Apply topically at bedtime. 45 g 1   No current facility-administered medications on file prior to visit.    Review of Systems  Constitutional:  Negative for activity change, appetite change, fatigue, fever and unexpected weight change.  HENT:  Negative for congestion, ear pain, rhinorrhea, sinus pressure and sore throat.   Eyes:  Negative for pain, redness and visual disturbance.  Respiratory:  Negative for cough, shortness of breath and wheezing.   Cardiovascular:  Negative for chest pain and palpitations.  Gastrointestinal:  Negative for abdominal pain, blood in stool, constipation and diarrhea.  Endocrine: Negative for polydipsia and polyuria.  Genitourinary:  Negative for dysuria, frequency and urgency.  Musculoskeletal:  Positive for arthralgias. Negative for back pain and myalgias.  Skin:  Negative for pallor and rash.  Allergic/Immunologic: Negative for environmental allergies.  Neurological:  Negative for dizziness, syncope and headaches.  Hematological:  Negative for adenopathy. Does not bruise/bleed easily.  Psychiatric/Behavioral:  Positive for sleep disturbance. Negative for decreased concentration and dysphoric mood. The patient is not nervous/anxious.        Objective:   Physical Exam Constitutional:      General: She  is not in acute distress.    Appearance: Normal appearance. She is well-developed. She is obese. She is not ill-appearing or diaphoretic.  HENT:     Head: Normocephalic and atraumatic.     Right Ear: Tympanic membrane, ear canal and external ear normal.     Left Ear: Tympanic membrane, ear canal and external ear normal.     Nose: Nose normal. No congestion.     Mouth/Throat:     Mouth: Mucous membranes are moist.     Pharynx: Oropharynx is clear. No posterior oropharyngeal erythema.  Eyes:     General: No scleral icterus.     Extraocular Movements: Extraocular movements intact.     Conjunctiva/sclera: Conjunctivae normal.     Pupils: Pupils are equal, round, and reactive to light.  Neck:     Thyroid: No thyromegaly.     Vascular: No carotid bruit or JVD.  Cardiovascular:     Rate and Rhythm: Normal rate and regular rhythm.     Pulses: Normal pulses.     Heart sounds: Normal heart sounds.     No gallop.  Pulmonary:     Effort: Pulmonary effort is normal. No respiratory distress.     Breath sounds: Normal breath sounds. No wheezing.     Comments: Good air exch Chest:     Chest wall: No tenderness.  Abdominal:     General: Bowel sounds are normal. There is no distension or abdominal bruit.     Palpations: Abdomen is soft. There is no mass.     Tenderness: There is no abdominal tenderness.     Hernia: No hernia is present.  Genitourinary:    Comments: Breast exam: No mass, nodules, thickening, tenderness, bulging, retraction, inflamation, nipple discharge or skin changes noted.  No axillary or clavicular LA.     Musculoskeletal:        General: No tenderness. Normal range of motion.     Cervical back: Normal range of motion and neck supple. No rigidity. No muscular tenderness.     Right lower leg: No edema.     Left lower leg: No edema.     Comments: No kyphosis   Limited rom knees  Lymphadenopathy:     Cervical: No cervical adenopathy.  Skin:    General: Skin is warm and dry.     Coloration: Skin is not pale.     Findings: No erythema or rash.     Comments: Fair  Solar lentigines diffusely Few sks  Neurological:     Mental Status: She is alert. Mental status is at baseline.     Cranial Nerves: No cranial nerve deficit.     Motor: No abnormal muscle tone.     Coordination: Coordination normal.     Gait: Gait normal.     Deep Tendon Reflexes: Reflexes are normal and symmetric. Reflexes normal.  Psychiatric:        Mood and Affect: Mood normal.        Cognition and Memory: Cognition and memory  normal.           Assessment & Plan:   Problem List Items Addressed This Visit       Cardiovascular and Mediastinum   Atrial fibrillation (HCC)    In permanent a fib Sees cardiology  On eliquis  No symptoms  Blood pressure and pulse are stable       Relevant Medications   atorvastatin (LIPITOR) 40 MG tablet   metoprolol succinate (TOPROL-XL) 25 MG 24 hr  tablet     Respiratory   Nodule of apex of right lung    CT 11/2022- stable  Sees pulmonary  Will re check at a year         Digestive   Cholelithiasis    No symptoms       GERD (gastroesophageal reflux disease)    No longer needs protonix - takes tums at night with good control  Can keep ppi on list for prn  B12 level is nl        Endocrine   Left thyroid nodule    No change in exam  Last imaging no follow up recommended       Relevant Medications   metoprolol succinate (TOPROL-XL) 25 MG 24 hr tablet     Musculoskeletal and Integument   Osteopenia    Dexa 05/2023 No falls/fracture  D level is ok  Encouraged strength building exercise as tolerated       Primary osteoarthritis of both knees    Struggling with this  Tylenol  Voltaren gel Keeps her up May consider knee repl in future        Other   Colon cancer screening    Colonoscopy 2020 - 7 y recall if healthy enough at that age      History of CVA (cerebrovascular accident)    On eliquis for a fib  No new events       Hyperlipidemia    Disc goals for lipids and reasons to control them Rev last labs with pt Rev low sat fat diet in detail Well controlled  LDL 52 Continues atorvastatin 40 mg daily       Relevant Medications   atorvastatin (LIPITOR) 40 MG tablet   metoprolol succinate (TOPROL-XL) 25 MG 24 hr tablet   Obesity (BMI 30-39.9)    Discussed how this problem influences overall health and the risks it imposes  Reviewed plan for weight loss with lower calorie diet (via better food choices (lower glycemic and portion  control) along with exercise building up to or more than 30 minutes 5 days per week including some aerobic activity and strength training         Prediabetes    Lab Results  Component Value Date   HGBA1C 5.9 05/01/2023   Stable disc imp of low glycemic diet and wt loss to prevent DM2       Routine general medical examination at a health care facility - Primary    Reviewed health habits including diet and exercise and skin cancer prevention Reviewed appropriate screening tests for age  Also reviewed health mt list, fam hx and immunization status , as well as social and family history   See HPI Labs reviewed and ordered Flu shot given  Declines shingrix  May get Td at pharmacy  Mammogram due in 10/2022 Dexa utd 05/2023 No falls/fracture PHQ 0        Sleep disorder    Encouraged to try increase trazodone to 100 mg and let us know if helpful  Also sleep hygiene  Is struggling  Pain worsens this       Vitamin D deficiency    Last vitamin D Lab Results  Component Value Date   VD25OH 57.93 05/01/2023   Vitamin D level is therapeutic with current supplementation Disc importance of this to bone and overall health       Other Visit Diagnoses     Need for influenza vaccination       Relevant  Orders   Flu Vaccine Trivalent High Dose (Fluad) (Completed)

## 2023-05-12 NOTE — Assessment & Plan Note (Signed)
In permanent a fib Sees cardiology  On eliquis  No symptoms  Blood pressure and pulse are stable

## 2023-05-12 NOTE — Assessment & Plan Note (Signed)
No symptoms 

## 2023-05-12 NOTE — Assessment & Plan Note (Signed)
Last vitamin D Lab Results  Component Value Date   VD25OH 57.93 05/01/2023   Vitamin D level is therapeutic with current supplementation Disc importance of this to bone and overall health

## 2023-05-12 NOTE — Assessment & Plan Note (Signed)
 Discussed how this problem influences overall health and the risks it imposes  Reviewed plan for weight loss with lower calorie diet (via better food choices (lower glycemic and portion control) along with exercise building up to or more than 30 minutes 5 days per week including some aerobic activity and strength training

## 2023-05-12 NOTE — Assessment & Plan Note (Signed)
Reviewed health habits including diet and exercise and skin cancer prevention Reviewed appropriate screening tests for age  Also reviewed health mt list, fam hx and immunization status , as well as social and family history   See HPI Labs reviewed and ordered Flu shot given  Declines shingrix  May get Td at pharmacy  Mammogram due in 10/2022 Dexa utd 05/2023 No falls/fracture PHQ 0

## 2023-05-12 NOTE — Assessment & Plan Note (Signed)
No change in exam  Last imaging no follow up recommended

## 2023-05-12 NOTE — Assessment & Plan Note (Signed)
Colonoscopy 2020 - 7 y recall if healthy enough at that age

## 2023-05-12 NOTE — Assessment & Plan Note (Signed)
Disc goals for lipids and reasons to control them Rev last labs with pt Rev low sat fat diet in detail Well controlled  LDL 52 Continues atorvastatin 40 mg daily

## 2023-05-12 NOTE — Patient Instructions (Addendum)
Flu shot today   Inquire about a tetnus shot at the pharmacy   Add some strength training to your routine, this is important for bone and brain health and can reduce your risk of falls and help your body use insulin properly and regulate weight  Light weights, exercise bands , and internet videos are a good way to start  Yoga (chair or regular), machines , floor exercises or a gym with machines are also good options    For sleep try trazodone 100 mg at bedtime - let me know how that works for sleep   Let me know and I will refill based on what you are taking    Take care of yourself

## 2023-05-12 NOTE — Assessment & Plan Note (Signed)
Struggling with this  Tylenol  Voltaren gel Keeps her up May consider knee repl in future

## 2023-05-12 NOTE — Assessment & Plan Note (Signed)
CT 11/2022- stable  Sees pulmonary  Will re check at a year

## 2023-05-12 NOTE — Assessment & Plan Note (Signed)
Dexa 05/2023 No falls/fracture  D level is ok  Encouraged strength building exercise as tolerated

## 2023-05-12 NOTE — Assessment & Plan Note (Signed)
Encouraged to try increase trazodone to 100 mg and let us know if helpful  Also sleep hygiene  Is struggling  Pain worsens this

## 2023-05-12 NOTE — Assessment & Plan Note (Signed)
No longer needs protonix - takes tums at night with good control  Can keep ppi on list for prn  B12 level is nl

## 2023-05-12 NOTE — Assessment & Plan Note (Signed)
Lab Results  Component Value Date   HGBA1C 5.9 05/01/2023   Stable disc imp of low glycemic diet and wt loss to prevent DM2

## 2023-05-12 NOTE — Assessment & Plan Note (Signed)
On eliquis for a fib  No new events

## 2023-05-15 ENCOUNTER — Encounter: Payer: Self-pay | Admitting: Pulmonary Disease

## 2023-05-30 ENCOUNTER — Other Ambulatory Visit: Payer: Self-pay | Admitting: Family Medicine

## 2023-05-30 MED ORDER — TRAZODONE HCL 100 MG PO TABS: 100.0000 mg | ORAL_TABLET | Freq: Every evening | ORAL | 3 refills | Status: DC | PRN

## 2023-05-30 NOTE — Telephone Encounter (Signed)
See prev comment saying:  What is the name of the medication or equipment? traZODone (DESYREL) 50 MG tablet, patient stated that taking 2 has helped a lot and would like a new prescription sent in for he to take the 2 and a 90 day supply.   Last filled with 1/2-1 tab dosing

## 2023-05-30 NOTE — Telephone Encounter (Signed)
I sent in the 100 mg dose   Glad it is helping

## 2023-05-30 NOTE — Telephone Encounter (Signed)
Prescription Request  05/30/2023  LOV: 05/12/2023  What is the name of the medication or equipment? traZODone (DESYREL) 50 MG tablet, patient stated that taking 2 has helped a lot and would like a new prescription sent in for he to take the 2 and a 90 day supply.   Have you contacted your pharmacy to request a refill? No   Which pharmacy would you like this sent to?  [facility] - [place], [place] - [address] [place] [address] Phone: [phone] Fax: [phone]    Patient notified that their request is being sent to the clinical staff for review and that they should receive a response within 2 business days.   Please advise at [facility] [phone]

## 2023-06-03 ENCOUNTER — Encounter: Payer: Self-pay | Admitting: Pulmonary Disease

## 2023-06-26 ENCOUNTER — Other Ambulatory Visit: Payer: Self-pay | Admitting: Family Medicine

## 2023-06-26 NOTE — Telephone Encounter (Signed)
That comes from cardiology I think she may be late for her 6 mo follow up with them (last seen in Jan?)  She needs to call  If some reason she cannot get this refilled in timely fashion let me know

## 2023-06-26 NOTE — Telephone Encounter (Signed)
Patient followed by cardiology ok to fill or should it come from them?

## 2023-06-27 ENCOUNTER — Telehealth: Payer: Self-pay | Admitting: Cardiology

## 2023-06-27 NOTE — Telephone Encounter (Signed)
*  STAT* If patient is at the pharmacy, call can be transferred to refill team.   1. Which medications need to be refilled? (please list name of each medication and dose if known)  ELIQUIS 5 MG TABS tablet  2. Which pharmacy/location (including street and city if local pharmacy) is medication to be sent to? [facility] - [place], [place] - [address]    3. Do they need a 30 day or 90 day supply?  90 day supply

## 2023-06-27 NOTE — Telephone Encounter (Signed)
Spoke with spouse and he will f/u with cardiology and let us know if there is any issues getting med with cardiology

## 2023-06-28 ENCOUNTER — Other Ambulatory Visit: Payer: Self-pay | Admitting: Family Medicine

## 2023-06-28 NOTE — Telephone Encounter (Signed)
Refill Request.  

## 2023-06-28 NOTE — Telephone Encounter (Signed)
It is refilled.

## 2023-06-28 NOTE — Telephone Encounter (Signed)
Rx written by Dr Milinda Antis

## 2023-06-28 NOTE — Telephone Encounter (Signed)
I refilled it

## 2023-06-28 NOTE — Telephone Encounter (Signed)
Looks like refill was denied a few days ago. Can we refill?

## 2023-06-28 NOTE — Telephone Encounter (Signed)
Patient husband called in and stated that she hasn't seen the cardiologist in a year so they will not refill the medication. He stated that today is her last day with medication and she will need it. He is worried about her having another stroke. He was wanting to know if Dr. Milinda Antis could go ahead and refill this for her. Please advise. Thank you!

## 2023-07-12 ENCOUNTER — Ambulatory Visit
Admission: RE | Admit: 2023-07-12 | Discharge: 2023-07-12 | Disposition: A | Discharge status: Home / Self Care | Payer: Medicare PPO | Source: Ambulatory Visit | Attending: Family Medicine | Admitting: Family Medicine

## 2023-07-12 DIAGNOSIS — M7989 Other specified soft tissue disorders: Secondary | ICD-10-CM | POA: Diagnosis not present

## 2023-07-12 DIAGNOSIS — Z780 Asymptomatic menopausal state: Secondary | ICD-10-CM | POA: Diagnosis not present

## 2023-07-13 ENCOUNTER — Encounter: Payer: Self-pay | Admitting: *Deleted

## 2023-09-12 ENCOUNTER — Encounter (HOSPITAL_COMMUNITY): Payer: Self-pay | Admitting: Emergency Medicine

## 2023-09-12 ENCOUNTER — Ambulatory Visit: Payer: Self-pay | Admitting: Family Medicine

## 2023-09-12 ENCOUNTER — Emergency Department (HOSPITAL_COMMUNITY): Payer: Medicare PPO

## 2023-09-12 ENCOUNTER — Other Ambulatory Visit: Payer: Self-pay

## 2023-09-12 ENCOUNTER — Emergency Department (HOSPITAL_COMMUNITY)
Admission: EM | Admit: 2023-09-12 | Discharge: 2023-09-12 | Disposition: A | Discharge status: Home / Self Care | Payer: Medicare PPO | Attending: Student | Admitting: Student

## 2023-09-12 DIAGNOSIS — Z8673 Personal history of transient ischemic attack (TIA), and cerebral infarction without residual deficits: Secondary | ICD-10-CM | POA: Diagnosis not present

## 2023-09-12 DIAGNOSIS — Z7901 Long term (current) use of anticoagulants: Secondary | ICD-10-CM | POA: Diagnosis not present

## 2023-09-12 DIAGNOSIS — W01198A Fall on same level from slipping, tripping and stumbling with subsequent striking against other object, initial encounter: Secondary | ICD-10-CM | POA: Diagnosis not present

## 2023-09-12 DIAGNOSIS — R0902 Hypoxemia: Secondary | ICD-10-CM | POA: Diagnosis not present

## 2023-09-12 DIAGNOSIS — K529 Noninfective gastroenteritis and colitis, unspecified: Secondary | ICD-10-CM | POA: Diagnosis not present

## 2023-09-12 DIAGNOSIS — S199XXA Unspecified injury of neck, initial encounter: Secondary | ICD-10-CM | POA: Diagnosis not present

## 2023-09-12 DIAGNOSIS — S0990XA Unspecified injury of head, initial encounter: Secondary | ICD-10-CM | POA: Diagnosis not present

## 2023-09-12 DIAGNOSIS — Z1152 Encounter for screening for COVID-19: Secondary | ICD-10-CM | POA: Insufficient documentation

## 2023-09-12 DIAGNOSIS — R918 Other nonspecific abnormal finding of lung field: Secondary | ICD-10-CM | POA: Diagnosis not present

## 2023-09-12 DIAGNOSIS — S0083XA Contusion of other part of head, initial encounter: Secondary | ICD-10-CM | POA: Insufficient documentation

## 2023-09-12 DIAGNOSIS — I10 Essential (primary) hypertension: Secondary | ICD-10-CM | POA: Diagnosis not present

## 2023-09-12 DIAGNOSIS — R9431 Abnormal electrocardiogram [ECG] [EKG]: Secondary | ICD-10-CM | POA: Diagnosis not present

## 2023-09-12 DIAGNOSIS — W19XXXA Unspecified fall, initial encounter: Secondary | ICD-10-CM | POA: Diagnosis not present

## 2023-09-12 DIAGNOSIS — I6381 Other cerebral infarction due to occlusion or stenosis of small artery: Secondary | ICD-10-CM | POA: Diagnosis not present

## 2023-09-12 DIAGNOSIS — R55 Syncope and collapse: Secondary | ICD-10-CM | POA: Diagnosis not present

## 2023-09-12 LAB — COMPREHENSIVE METABOLIC PANEL
ALT: 16 U/L (ref 0–44)
AST: 19 U/L (ref 15–41)
Albumin: 3.8 g/dL (ref 3.5–5.0)
Alkaline Phosphatase: 77 U/L (ref 38–126)
Anion gap: 12 (ref 5–15)
BUN: 15 mg/dL (ref 8–23)
CO2: 23 mmol/L (ref 22–32)
Calcium: 9.2 mg/dL (ref 8.9–10.3)
Chloride: 102 mmol/L (ref 98–111)
Creatinine, Ser: 0.84 mg/dL (ref 0.44–1.00)
GFR, Estimated: >60 mL/min (ref >60)
Glucose, Bld: 121 mg/dL — ABNORMAL HIGH (ref 70–99)
Potassium: 4 mmol/L (ref 3.5–5.1)
Sodium: 137 mmol/L (ref 135–145)
Total Bilirubin: 1.4 mg/dL — ABNORMAL HIGH (ref 0.0–1.2)
Total Protein: 6.5 g/dL (ref 6.5–8.1)

## 2023-09-12 LAB — CBC WITH DIFFERENTIAL/PLATELET
Abs Immature Granulocytes: 0.03 10*3/uL (ref 0.00–0.07)
Basophils Absolute: 0 10*3/uL (ref 0.0–0.1)
Basophils Relative: 0 %
Eosinophils Absolute: 0 10*3/uL (ref 0.0–0.5)
Eosinophils Relative: 0 %
HCT: 42.6 % (ref 36.0–46.0)
Hemoglobin: 14.8 g/dL (ref 12.0–15.0)
Immature Granulocytes: 0 %
Lymphocytes Relative: 4 %
Lymphs Abs: 0.3 10*3/uL — ABNORMAL LOW (ref 0.7–4.0)
MCH: 32.2 pg (ref 26.0–34.0)
MCHC: 34.7 g/dL (ref 30.0–36.0)
MCV: 92.8 fL (ref 80.0–100.0)
Monocytes Absolute: 0.5 10*3/uL (ref 0.1–1.0)
Monocytes Relative: 5 %
Neutro Abs: 8.1 10*3/uL — ABNORMAL HIGH (ref 1.7–7.7)
Neutrophils Relative %: 91 %
Platelets: 204 10*3/uL (ref 150–400)
RBC: 4.59 MIL/uL (ref 3.87–5.11)
RDW: 11.9 % (ref 11.5–15.5)
WBC: 9 10*3/uL (ref 4.0–10.5)
nRBC: 0 % (ref 0.0–0.2)

## 2023-09-12 LAB — RESP PANEL BY RT-PCR (RSV, FLU A&B, COVID)  RVPGX2
Influenza A by PCR: NEGATIVE
Influenza B by PCR: NEGATIVE
Resp Syncytial Virus by PCR: NEGATIVE
SARS Coronavirus 2 by RT PCR: NEGATIVE

## 2023-09-12 MED ORDER — ONDANSETRON HCL 4 MG/2ML IJ SOLN
4.0000 mg | Freq: Once | INTRAMUSCULAR | Status: AC
  Administered 2023-09-12: 4 mg via INTRAVENOUS
  Filled 2023-09-12: qty 2

## 2023-09-12 MED ORDER — LACTATED RINGERS IV BOLUS
1000.0000 mL | Freq: Once | INTRAVENOUS | Status: AC
  Administered 2023-09-12: 1000 mL via INTRAVENOUS

## 2023-09-12 MED ORDER — ONDANSETRON 4 MG PO TBDP: 4.0000 mg | ORAL_TABLET | Freq: Three times a day (TID) | ORAL | 0 refills | Status: DC | PRN

## 2023-09-12 NOTE — ED Triage Notes (Addendum)
 Pt BIB [facility] as a Level 2 Trauma for a fall on thinners.  Pt is on Eliquis . Pt had three syncopal episodes per husband.  The first was on toilet around 2330, 2nd during the night while throwing up, and another this morning. Pt has also had multiple episodes of diarrhea.   PT is still nauseous and has a bruise to forehead from hitting head during first fall.

## 2023-09-12 NOTE — ED Notes (Signed)
Pt tolerated PO fluids and ambulated independently.

## 2023-09-12 NOTE — ED Provider Notes (Signed)
 Patient care transferred to me. She has tolerated oral fluids and ambulated to the bathroom without difficulty. No BMs here. Feels well enough for discharge. CMP/CBC are overall unremarkable. Will d/c as per prior plan with Dr. Kommor.   Freddi Hamilton, MD 09/12/23 [phone]

## 2023-09-12 NOTE — ED Notes (Signed)
 Got patient into a gown on the monitor did EKG shown to er doctor patient is resting with call bell in reach

## 2023-09-12 NOTE — Progress Notes (Signed)
 Orthopedic Tech Progress Note Patient Details:  Deborah Carey [facility] 11-Mar-1951 [id number]  Level 2 trauma,   Patient ID: Deborah Carey, female   DOB: 1951/04/28, 73 y.o.   MRN: [id number]  Donald Pore 09/12/2023, 1:32 PM

## 2023-09-12 NOTE — Discharge Instructions (Addendum)
 We are giving you nausea medicine prescription to help with your nausea.  You may take Imodium and/or Pepto-Bismol to help with your diarrhea.  Otherwise continue your home medications and follow-up closely with your primary care physician.  If you develop recurrent dizziness or passing out, abdominal pain, vomiting, diarrhea, bloody stools or vomit, or any other new/concerning symptoms then return to the ER or call 911.

## 2023-09-12 NOTE — ED Provider Notes (Signed)
 [place] EMERGENCY DEPARTMENT AT [facility] Provider Note  CSN: [id number] Arrival date & time: 09/12/23 1250  Chief Complaint(s) No chief complaint on file.  HPI Deborah Carey is a 73 y.o. female with PMH previous CVA, A-fib on Eliquis  who presents to the emergency department as a level 2 trauma for a fall on blood thinners.  Patient states that over the last few days she has had copious diarrhea and vomiting with inability to tolerate p.o.  She had a syncopal episode today falling and striking her head on the ground after using toilet.  Patient arrives with minimal complaints from a trauma standpoint including no chest pain, shortness of breath, abdominal pain, numbness, tingling, weakness, back pain or pain in the extremities.  Patient states she is primarily concerned about her dehydration and inability tolerate p.o.   Past Medical History Past Medical History:  Diagnosis Date   Allergy    allergic rhinitis   Anxiety    Cataract    removed both eyes    Infertility, female    Osteoarthritis of both knees    OA    PONV (postoperative nausea and vomiting)    Post-menopausal bleeding    neg endo bx.   Patient Active Problem List   Diagnosis Date Noted   Sleep disorder 05/12/2023   GERD (gastroesophageal reflux disease) 05/04/2022   Current use of proton pump inhibitor 05/04/2022   Skin spots-aging 05/04/2022   Right shoulder pain 01/04/2022   Chronic ischemic left MCA stroke 06/18/2021   Cholelithiasis 06/07/2021   Nodule of apex of right lung 06/07/2021   History of CVA (cerebrovascular accident) 05/25/2021   Atrial fibrillation (HCC)    Colon cancer screening 05/14/2019   Vitamin D  deficiency 05/08/2018   Flushing 06/20/2017   H/O fracture of fibula 01/21/2014   Osteopenia 01/07/2014   Family history of osteoporosis 11/29/2013   Estrogen deficiency 11/29/2013   Hyperlipidemia 11/29/2013   Encounter for Medicare annual wellness exam 10/29/2013   Left  thyroid  nodule 09/26/2013   Enlarged thyroid  09/16/2013   Primary osteoarthritis of both knees 11/05/2012   Other screening mammogram 09/27/2011   Routine general medical examination at a health care facility 09/19/2011   Obesity (BMI 30-39.9) 06/06/2008   Prediabetes 06/06/2008   Home Medication(s) Prior to Admission medications   Medication Sig Start Date End Date Taking? Authorizing Provider  ondansetron  (ZOFRAN -ODT) 4 MG disintegrating tablet Take 1 tablet (4 mg total) by mouth every 8 (eight) hours as needed for nausea or vomiting. 09/12/23  Yes Freddi Hamilton, MD  acetaminophen  (TYLENOL ) 325 MG tablet Take 1-2 tablets (325-650 mg total) by mouth every 4 (four) hours as needed for mild pain. 06/02/21   Love, Sharlet RAMAN, PA-C  albuterol  (VENTOLIN  HFA) 108 (90 Base) MCG/ACT inhaler INHALE 2 PUFFS INTO THE LUNGS EVERY 4 (FOUR) HOURS AS NEEDED. 05/04/22   Tower, Laine LABOR, MD  atorvastatin  (LIPITOR) 40 MG tablet Take 1 tablet (40 mg total) by mouth daily. 05/12/23   Tower, Laine LABOR, MD  ELIQUIS  5 MG TABS tablet TAKE ONE TABLET BY MOUTH TWICE DAILY 06/28/23   Tower, Laine LABOR, MD  metoprolol  succinate (TOPROL -XL) 25 MG 24 hr tablet Take 1 tablet (25 mg total) by mouth daily. 05/12/23   Tower, Laine LABOR, MD  mometasone  (ELOCON ) 0.1 % cream Apply 1 Application topically daily as needed (Rash). Qd to bid up to 5 days a week aa itchy rash on upper back until clear, then as needed for flares 07/04/22  Jackquline Sawyer, MD  pantoprazole  (PROTONIX ) 40 MG tablet TAKE 1 TABLET BY MOUTH DAILY FOR REFLUX-CAN USE AS NEEDED IF SYMPTOMS CONTROLLED 03/28/22   Tower, Laine LABOR, MD  traZODone  (DESYREL ) 100 MG tablet Take 1 tablet (100 mg total) by mouth at bedtime as needed for sleep. 05/30/23   Tower, Laine LABOR, MD  tretinoin  (RETIN-A ) 0.025 % cream Apply topically at bedtime. 05/04/22   Tower, Laine LABOR, MD                                                                                                                                     Past Surgical History Past Surgical History:  Procedure Laterality Date   cataract surgery  2011   CESAREAN SECTION     times 2   COLONOSCOPY  2010   hems    ENDOMETRIAL BIOPSY     neg for CA cells   FACIAL COSMETIC SURGERY  2006   face lift   FOOT SURGERY  1998   rt heel spur removal   IR CT HEAD LTD  05/25/2021   IR PERCUTANEOUS ART THROMBECTOMY/INFUSION INTRACRANIAL INC DIAG ANGIO  05/25/2021   IR US  GUIDE VASC ACCESS RIGHT  05/25/2021   RADIOLOGY WITH ANESTHESIA N/A 05/25/2021   Procedure: IR WITH ANESTHESIA;  Surgeon: Luverne Aran, MD;  Location: [facility] OR;  Service: Radiology;  Laterality: N/A;   REFRACTIVE SURGERY  08/1999   Family History Family History  Problem Relation Age of Onset   COPD Brother    Stroke Mother    Hypertension Mother    Osteoporosis Mother    Alzheimer's disease Father    Hypertension Father    Osteoporosis Father    Depression Sister    Hypertension Sister    Osteoporosis Sister    Thyroid  disease Sister    Hypothyroidism Sister    Parkinson's disease Sister    Osteoporosis Sister    Hypothyroidism Sister    Breast cancer Neg Hx    Colon polyps Neg Hx    Colon cancer Neg Hx    Esophageal cancer Neg Hx    Rectal cancer Neg Hx    Stomach cancer Neg Hx     Social History Social History   Tobacco Use   Smoking status: Never   Smokeless tobacco: Never  Vaping Use   Vaping status: Never Used  Substance Use Topics   Alcohol use: No    Alcohol/week: 0.0 standard drinks of alcohol   Drug use: No   Allergies Patient has no known allergies.  Review of Systems Review of Systems  Gastrointestinal:  Positive for diarrhea, nausea and vomiting.  Neurological:  Positive for syncope and light-headedness.    Physical Exam Vital Signs  I have reviewed the triage vital signs BP 123/66   Pulse 65   Temp 98.3 F (36.8 C) (Oral)   Resp 15   Ht 5' 3 (1.6 m)   Wt  83.9 kg   LMP 09/05/2008   SpO2 90%   BMI 32.77 kg/m   Physical  Exam Vitals and nursing note reviewed.  Constitutional:      General: She is not in acute distress.    Appearance: She is well-developed.  HENT:     Head: Normocephalic.     Comments: Forehead hematoma Eyes:     Conjunctiva/sclera: Conjunctivae normal.  Cardiovascular:     Rate and Rhythm: Normal rate and regular rhythm.     Heart sounds: No murmur heard. Pulmonary:     Effort: Pulmonary effort is normal. No respiratory distress.     Breath sounds: Normal breath sounds.  Abdominal:     Palpations: Abdomen is soft.     Tenderness: There is no abdominal tenderness.  Musculoskeletal:        General: No swelling.     Cervical back: Neck supple.  Skin:    General: Skin is warm and dry.     Capillary Refill: Capillary refill takes less than 2 seconds.  Neurological:     Mental Status: She is alert.  Psychiatric:        Mood and Affect: Mood normal.     ED Results and Treatments Labs (all labs ordered are listed, but only abnormal results are displayed) Labs Reviewed  COMPREHENSIVE METABOLIC PANEL - Abnormal; Notable for the following components:      Result Value   Glucose, Bld 121 (*)    Total Bilirubin 1.4 (*)    All other components within normal limits  CBC WITH DIFFERENTIAL/PLATELET - Abnormal; Notable for the following components:   Neutro Abs 8.1 (*)    Lymphs Abs 0.3 (*)    All other components within normal limits  RESP PANEL BY RT-PCR (RSV, FLU A&B, COVID)  RVPGX2  C DIFFICILE QUICK SCREEN W PCR REFLEX    GASTROINTESTINAL PANEL BY PCR, STOOL (REPLACES STOOL CULTURE)                                                                                                                          Radiology CT HEAD WO CONTRAST ( ) Result Date: 09/12/2023 CLINICAL DATA:  Head trauma, minor (Age >= 65y); Neck trauma (Age >= 65y) EXAM: CT HEAD WITHOUT CONTRAST CT CERVICAL SPINE WITHOUT CONTRAST TECHNIQUE: Multidetector CT imaging of the head and cervical spine was performed  following the standard protocol without intravenous contrast. Multiplanar CT image reconstructions of the cervical spine were also generated. RADIATION DOSE REDUCTION: This exam was performed according to the departmental dose-optimization program which includes automated exposure control, adjustment of the mA and/or kV according to patient size and/or use of iterative reconstruction technique. COMPARISON:  None Available. FINDINGS: CT HEAD FINDINGS Brain: No evidence of acute infarction, hemorrhage, hydrocephalus, extra-axial collection or mass lesion/mass effect. Remote left basal ganglia infarct. Vascular: Calcific atherosclerosis. Skull: No acute fracture. Sinuses/Orbits: Clear sinuses.  No acute orbital findings. Other: No mastoid effusions. CT CERVICAL SPINE FINDINGS Alignment: No substantial  sagittal subluxation. Skull base and vertebrae: No evidence of acute fracture or vertebral body height loss. Small chronic limbus vertebra versus chronic/remote fracture of the anterior/superior to C6 vertebral body, similar to CTA head/neck on 05/25/2021. Soft tissues and spinal canal: No prevertebral fluid or swelling. No visible canal hematoma. Disc levels: Mild for age bony degenerative change. Upper chest: Approximately 1.1 cm pulmonary nodule in the right upper lobe and multiple other subcentimeter pulmonary nodules in the left upper lobe, similar in comparison to CT of the chest from November 11, 2021. IMPRESSION: 1. No evidence of acute abnormality intracranially or in the cervical spine. 2. Approximately 1.1 cm pulmonary nodule in the right upper lobe and multiple other subcentimeter pulmonary nodules in the left upper lobe, similar in comparison to CT of the chest from November 11, 2021. Electronically Signed   By: Gilmore GORMAN Molt M.D.   On: 09/12/2023 14:08   CT CERVICAL SPINE WO CONTRAST Result Date: 09/12/2023 CLINICAL DATA:  Head trauma, minor (Age >= 65y); Neck trauma (Age >= 65y) EXAM: CT HEAD WITHOUT CONTRAST  CT CERVICAL SPINE WITHOUT CONTRAST TECHNIQUE: Multidetector CT imaging of the head and cervical spine was performed following the standard protocol without intravenous contrast. Multiplanar CT image reconstructions of the cervical spine were also generated. RADIATION DOSE REDUCTION: This exam was performed according to the departmental dose-optimization program which includes automated exposure control, adjustment of the mA and/or kV according to patient size and/or use of iterative reconstruction technique. COMPARISON:  None Available. FINDINGS: CT HEAD FINDINGS Brain: No evidence of acute infarction, hemorrhage, hydrocephalus, extra-axial collection or mass lesion/mass effect. Remote left basal ganglia infarct. Vascular: Calcific atherosclerosis. Skull: No acute fracture. Sinuses/Orbits: Clear sinuses.  No acute orbital findings. Other: No mastoid effusions. CT CERVICAL SPINE FINDINGS Alignment: No substantial sagittal subluxation. Skull base and vertebrae: No evidence of acute fracture or vertebral body height loss. Small chronic limbus vertebra versus chronic/remote fracture of the anterior/superior to C6 vertebral body, similar to CTA head/neck on 05/25/2021. Soft tissues and spinal canal: No prevertebral fluid or swelling. No visible canal hematoma. Disc levels: Mild for age bony degenerative change. Upper chest: Approximately 1.1 cm pulmonary nodule in the right upper lobe and multiple other subcentimeter pulmonary nodules in the left upper lobe, similar in comparison to CT of the chest from November 11, 2021. IMPRESSION: 1. No evidence of acute abnormality intracranially or in the cervical spine. 2. Approximately 1.1 cm pulmonary nodule in the right upper lobe and multiple other subcentimeter pulmonary nodules in the left upper lobe, similar in comparison to CT of the chest from November 11, 2021. Electronically Signed   By: Gilmore GORMAN Molt M.D.   On: 09/12/2023 14:08    Pertinent labs & imaging results that  were available during my care of the patient were reviewed by me and considered in my medical decision making (see MDM for details).  Medications Ordered in ED Medications  ondansetron  (ZOFRAN ) injection 4 mg (4 mg Intravenous Given 09/12/23 1329)  lactated ringers  bolus 1,000 mL (0 mLs Intravenous Stopped 09/12/23 1723)  Procedures .Critical Care  Performed by: Albertina Dixon, MD Authorized by: Albertina Dixon, MD   Critical care provider statement:    Critical care time (minutes):  30   Critical care was necessary to treat or prevent imminent or life-threatening deterioration of the following conditions:  Trauma   Critical care was time spent personally by me on the following activities:  Development of treatment plan with patient or surrogate, discussions with consultants, evaluation of patient's response to treatment, examination of patient, ordering and review of laboratory studies, ordering and review of radiographic studies, ordering and performing treatments and interventions, pulse oximetry, re-evaluation of patient's condition and review of old charts   (including critical care time)  Medical Decision Making / ED Course   This patient presents to the ED for concern of vomiting, diarrhea, fall on blood thinners, this involves an extensive number of treatment options, and is a complaint that carries with it a high risk of complications and morbidity.  The differential diagnosis includes fracture, contusion, hematoma, ligamentous injury, closed head injury, ICH, laceration, intrathoracic injury, intra-abdominal injury, gastroenteritis, unspecified viral URI  MDM: Patient seen emergency room for evaluation of vomiting, diarrhea and a fall on blood thinners.  Arrives as a level 2 trauma and primary survey is unremarkable.  Secondary survey also largely  unremarkable outside of a right-sided forehead hematoma.  Laboratory evaluation largely unremarkable.  COVID, flu, RSV negative.  Trauma imaging reassuringly negative for acute traumatic injury.  No evidence of trauma over the chest abdomen or pelvis and thus additional imaging deferred.  Patient fluid resuscitated and given Zofran  for symptom control.  At time of signout, patient pending p.o. challenge and ambulatory trial.  If she is able to tolerate p.o. and ambulate without difficulty and without significant orthostasis she would likely be safe for discharge home.  Please see provider signout note for continuation of workup.   Additional history obtained: -Additional history obtained from husband -External records from outside source obtained and reviewed including: Chart review including previous notes, labs, imaging, consultation notes   Lab Tests: -I ordered, reviewed, and interpreted labs.   The pertinent results include:   Labs Reviewed  COMPREHENSIVE METABOLIC PANEL - Abnormal; Notable for the following components:      Result Value   Glucose, Bld 121 (*)    Total Bilirubin 1.4 (*)    All other components within normal limits  CBC WITH DIFFERENTIAL/PLATELET - Abnormal; Notable for the following components:   Neutro Abs 8.1 (*)    Lymphs Abs 0.3 (*)    All other components within normal limits  RESP PANEL BY RT-PCR (RSV, FLU A&B, COVID)  RVPGX2  C DIFFICILE QUICK SCREEN W PCR REFLEX    GASTROINTESTINAL PANEL BY PCR, STOOL (REPLACES STOOL CULTURE)      EKG   EKG Interpretation Date/Time:  Tuesday September 12 2023 13:11:34 EST Ventricular Rate:  88 PR Interval:    QRS Duration:  98 QT Interval:  371 QTC Calculation: 449 R Axis:   28  Text Interpretation: Atrial fibrillation Low voltage, precordial leads Borderline T abnormalities, diffuse leads Confirmed by Freddi Hamilton [phone]) on 09/12/2023 4:44:57 PM         Imaging Studies ordered: I ordered imaging studies  including CT head, C-spine I independently visualized and interpreted imaging. I agree with the radiologist interpretation   Medicines ordered and prescription drug management: Meds ordered this encounter  Medications   ondansetron  (ZOFRAN ) injection 4 mg   lactated ringers  bolus 1,000 mL  ondansetron  (ZOFRAN -ODT) 4 MG disintegrating tablet    Sig: Take 1 tablet (4 mg total) by mouth every 8 (eight) hours as needed for nausea or vomiting.    Dispense:  10 tablet    Refill:  0    -I have reviewed the patients home medicines and have made adjustments as needed  Critical interventions Trauma activation and evaluation   Cardiac Monitoring: The patient was maintained on a cardiac monitor.  I personally viewed and interpreted the cardiac monitored which showed an underlying rhythm of: A-fib, rate controlled  Social Determinants of Health:  Factors impacting patients care include: none   Reevaluation: After the interventions noted above, I reevaluated the patient and found that they have :improved  Co morbidities that complicate the patient evaluation  Past Medical History:  Diagnosis Date   Allergy    allergic rhinitis   Anxiety    Cataract    removed both eyes    Infertility, female    Osteoarthritis of both knees    OA    PONV (postoperative nausea and vomiting)    Post-menopausal bleeding    neg endo bx.      Dispostion: I considered admission for this patient, and disposition pending ambulatory trial and p.o. trial.  Please see provider signout for continuation of workup.     Final Clinical Impression(s) / ED Diagnoses Final diagnoses:  Acute gastroenteritis  Contusion of forehead, initial encounter     @PCDICTATION @    Albertina Dixon, MD 09/12/23 2120

## 2023-09-12 NOTE — Telephone Encounter (Signed)
 In ED now Aware, will watch for correspondence

## 2023-09-12 NOTE — Telephone Encounter (Signed)
  Chief Complaint: syncopal episode last night Symptoms: abdominal pain, weakness, vomiting, diarrhea, syncopal episode, head injury (on Eliquis ) Frequency: last night around 11:30pm Pertinent Negatives: Patient denies blood in stool, chest pain, SOB, headaches, neck pain Disposition: [x] ED /[] Urgent Care (no appt availability in office) / [] Appointment(In office/virtual)/ []  [place] [facility]/ [] Home Care/ [] Refused Recommended Disposition /[] [place] [facility]/ []  Follow-up with PCP Additional Notes: Patient reluctant to go to ED, requested to be seen by PCP today. Advised patient that her symptoms require emergent care. Patient agreeable to have her husband take her to ED.  Copied from [id number]. Topic: Clinical - Pink Word Triage >> Sep 12, 2023  8:00 AM Curlee H wrote: Reason for Triage: Patient experienced the following: diarrhea and vomitting and she passed out last night for a few minutes. Reason for Disposition  Any head or face injury  Answer Assessment - Initial Assessment Questions 1. ONSET: How long were you unconscious? (minutes) When did it happen?     Last night  around 11:30pm while on the toilet vomiting and having diarrhea. Patient had a witnessed syncopal episode, husband states she passed out for less than a minute.  2. CONTENT: What happened during period of unconsciousness? (e.g., seizure activity)      On the toilet vomiting and having diarrhea.  3. MENTAL STATUS: Alert and oriented now? (oriented x 3 = name, month, location)      Patient's husband states she is fully alert and oriented this AM.  4. TRIGGER: What do you think caused the fainting? What were you doing just before you fainted?  (e.g., exercise, sudden standing up, prolonged standing)     Dehydration. Patient had not attempted to stand up, was sitting while syncopal episode happened.  5. RECURRENT SYMPTOM: Have you ever passed out before? If Yes, ask: When was the last time? and  What happened that time?      Patient denies having this happen before.  6. INJURY: Did you sustain any injury during the fall?      Husband states patient hit her head during the syncopal episode. Patient states she hit her forehead and has bruising.  7. CARDIAC SYMPTOMS: Have you had any of the following symptoms: chest pain, difficulty breathing, palpitations?     Denies.  8. NEUROLOGIC SYMPTOMS: Have you had any of the following symptoms: headache, numbness, vertigo, weakness?     Weakness since last night after having diarrhea and vomiting, pt unsure about numbness.  9. GI SYMPTOMS: Have you had any of the following symptoms: abdomen pain, vomiting, diarrhea, blood in stools?     Vomiting, diarrhea, 8/10 abdominal pain last night and patient states the pain has improved.  10. OTHER SYMPTOMS: Do you have any other symptoms?       Denies any additional symptoms.  Protocols used: [employer]

## 2023-09-20 ENCOUNTER — Telehealth: Payer: Self-pay

## 2023-09-20 NOTE — Progress Notes (Signed)
 Transition Care Management Follow-up Telephone Call Date of discharge and from where: 09/12/2023 [facility] How have you been since you were released from the hospital? Patient stated she is feeling much better and is able to eat. Any questions or concerns? No  Items Reviewed: Did the pt receive and understand the discharge instructions provided? Yes  Medications obtained and verified? Yes  Other? No  Any new allergies since your discharge? No  Dietary orders reviewed? Yes Do you have support at home? Yes   Follow up appointments reviewed:  PCP Hospital f/u appt confirmed? No  Scheduled to see  on  @ . Specialist Hospital f/u appt confirmed? No  Scheduled to see  on  @ . Are transportation arrangements needed? No  If their condition worsens, is the pt aware to call PCP or go to the Emergency Dept.? Yes Was the patient provided with contact information for the PCP's office or ED? Yes Was to pt encouraged to call back with questions or concerns? Yes   Sebastin Perlmutter [facility]  [facility], [facility] Guide Direct Dial: [phone]  Website: [email or web address]

## 2023-09-23 ENCOUNTER — Other Ambulatory Visit: Payer: Self-pay | Admitting: Family Medicine

## 2023-09-27 ENCOUNTER — Other Ambulatory Visit: Payer: Self-pay | Admitting: Family Medicine

## 2023-09-27 DIAGNOSIS — Z1231 Encounter for screening mammogram for malignant neoplasm of breast: Secondary | ICD-10-CM

## 2023-10-10 ENCOUNTER — Ambulatory Visit
Admission: RE | Admit: 2023-10-10 | Discharge: 2023-10-10 | Disposition: A | Discharge status: Home / Self Care | Payer: Medicare PPO | Source: Ambulatory Visit | Attending: Family Medicine | Admitting: Family Medicine

## 2023-10-10 DIAGNOSIS — Z1231 Encounter for screening mammogram for malignant neoplasm of breast: Secondary | ICD-10-CM | POA: Diagnosis not present

## 2023-10-12 ENCOUNTER — Encounter: Payer: Self-pay | Admitting: Family Medicine

## 2023-11-23 ENCOUNTER — Telehealth: Payer: Self-pay | Admitting: Pulmonary Disease

## 2023-11-23 NOTE — Telephone Encounter (Signed)
 Patient is aware of results and voiced her understanding. She would like cancel 11/28/22 appt with Dr. Jayme Cloud, as she is not having any new or worsening sx. Appt scheduled. CT ordered.

## 2023-11-23 NOTE — Telephone Encounter (Signed)
 I have not seen her since March 2023 and at this point I would have to see her again to order a new scan.

## 2023-11-23 NOTE — Telephone Encounter (Signed)
 Dr. Jayme Cloud this patient had a CT 11/14/2022 and you placed an order for CT to be done in 1 year but it expired and fell out of my box. She did have a Ct cervical Spine done 09/12/23. Do we still need to have her do your Chest CT

## 2023-12-18 DIAGNOSIS — Z961 Presence of intraocular lens: Secondary | ICD-10-CM | POA: Diagnosis not present

## 2023-12-18 DIAGNOSIS — H43813 Vitreous degeneration, bilateral: Secondary | ICD-10-CM | POA: Diagnosis not present

## 2023-12-19 ENCOUNTER — Ambulatory Visit: Payer: Medicare PPO | Admitting: Dermatology

## 2023-12-19 DIAGNOSIS — L649 Androgenic alopecia, unspecified: Secondary | ICD-10-CM | POA: Diagnosis not present

## 2023-12-19 DIAGNOSIS — L6612 Frontal fibrosing alopecia: Secondary | ICD-10-CM | POA: Diagnosis not present

## 2023-12-19 DIAGNOSIS — L814 Other melanin hyperpigmentation: Secondary | ICD-10-CM

## 2023-12-19 DIAGNOSIS — L821 Other seborrheic keratosis: Secondary | ICD-10-CM

## 2023-12-19 MED ORDER — KETOCONAZOLE 2 % EX SHAM: MEDICATED_SHAMPOO | CUTANEOUS | 3 refills | Status: AC

## 2023-12-19 MED ORDER — MINOXIDIL 2.5 MG PO TABS: 2.5000 mg | ORAL_TABLET | Freq: Every day | ORAL | 1 refills | Status: DC

## 2023-12-19 MED ORDER — BETAMETHASONE DIPROPIONATE 0.05 % EX LOTN: TOPICAL_LOTION | CUTANEOUS | 3 refills | Status: DC

## 2023-12-19 MED ORDER — FINASTERIDE 5 MG PO TABS: 5.0000 mg | ORAL_TABLET | Freq: Every day | ORAL | 1 refills | Status: DC

## 2023-12-19 NOTE — Patient Instructions (Addendum)
 Start Finasteride 5 mg take 1/2 tablet by mouth daily. If no side effects after 1 month, increase to 1 full tablet daily.  Start Minoxidil 2.5 mg - take 1/2 tablet by mouth daily. If no side effects after 1 month, increase to 1 full tablet daily. Doses of oral minoxidil for hair loss are considered 'low dose'. This is because the doses used for hair loss are much lower than the doses which are used for conditions such as high blood pressure (hypertension). The doses used for hypertension are 10-40mg  per day.  Side effects are uncommon at the low doses (up to 2.5 mg/day) used to treat hair loss. Potential side effects, more commonly seen at higher doses, include: Increase in hair growth (hypertrichosis) elsewhere on face and body Temporary hair shedding upon starting medication which may last up to 4 weeks Ankle swelling, fluid retention, rapid weight gain more than 5 pounds Low blood pressure and feeling lightheaded or dizzy when standing up quickly Fast or irregular heartbeat Headaches  Start betamethasone dipropionate lotion - apply to frontal scalp at bedtime, avoiding face.  Topical steroids (such as triamcinolone, fluocinolone, fluocinonide, mometasone, clobetasol, halobetasol, betamethasone, hydrocortisone) can cause thinning and lightening of the skin if they are used for too long in the same area. Your physician has selected the right strength medicine for your problem and area affected on the body. Please use your medication only as directed by your physician to prevent side effects.    Frontal fibrosing alopecia (FFA) is a chronic/progressive and irreversible patterned form of scarring alopecia that presents as a symmetric band-like zone of hair loss along the anterior hairline. The eyebrows are often thinned or absent. It is considered a variant of lichen planopilaris in a patterned distribution and is more common in women. Therapies may stop or slow progression but generally do not lead to  hair regrowth.  Female Androgenic Alopecia is a chronic condition related to genetics and/or hormonal changes.  In women androgenetic alopecia is commonly associated with menopause but may occur any time after puberty.  It causes hair thinning primarily on the crown with widening of the part and temporal hairline recession.  Can use OTC Rogaine (minoxidil) 5% solution/foam as directed.  Oral treatments in female patients who have no contraindication may include : - Low dose oral minoxidil 1.25 - 5mg  daily - Spironolactone 50 - 100mg  bid - Finasteride 2.5 - 5 mg daily Adjunctive therapies include: - Low Level Laser Light Therapy (LLLT) - Platelet-rich plasma injections (PRP) - Hair Transplants or scalp reduction  Cryotherapy Aftercare  Wash gently with soap and water everyday.   Apply Vaseline and Band-Aid daily until healed.   Discussed cosmetic procedure cryotherapy, noncovered.  $60 for 1st lesion and $15 for each additional lesion if done on the same day.  Maximum charge $350.  One touch-up treatment included no charge. Discussed risks of treatment including dyspigmentation, small scar, and/or recurrence. Recommend daily broad spectrum sunscreen SPF 30+/photoprotection to treated areas once healed.   Due to recent changes in healthcare laws, you may see results of your pathology and/or laboratory studies on MyChart before the doctors have had a chance to review them. We understand that in some cases there may be results that are confusing or concerning to you. Please understand that not all results are received at the same time and often the doctors may need to interpret multiple results in order to provide you with the best plan of care or course of treatment. Therefore, we  ask that you please give [place] 2 business days to thoroughly review all your results before contacting the office for clarification. Should we see a critical lab result, you will be contacted sooner.   If You Need Anything  After Your Visit  If you have any questions or concerns for your doctor, please call our main line at [phone] and press option 4 to reach your doctor's medical assistant. If no one answers, please leave a voicemail as directed and we will return your call as soon as possible. Messages left after 4 pm will be answered the following business day.   You may also send [place] a message via MyChart. We typically respond to MyChart messages within 1-2 business days.  For prescription refills, please ask your pharmacy to contact our office. Our fax number is [phone].  If you have an urgent issue when the clinic is closed that cannot wait until the next business day, you can page your doctor at the number below.    Please note that while we do our best to be available for urgent issues outside of office hours, we are not available 24/7.   If you have an urgent issue and are unable to reach [place], you may choose to seek medical care at your doctor's office, retail clinic, urgent care center, or emergency room.  If you have a medical emergency, please immediately call 911 or go to the emergency department.  Pager Numbers  - Dr. Gwen Pounds: [phone]  - Dr. Roseanne Reno: [phone]  - Dr. Katrinka Blazing: [phone]   In the event of inclement weather, please call our main line at [phone] for an update on the status of any delays or closures.  Dermatology Medication Tips: Please keep the boxes that topical medications come in in order to help keep track of the instructions about where and how to use these. Pharmacies typically print the medication instructions only on the boxes and not directly on the medication tubes.   If your medication is too expensive, please contact our office at [phone] option 4 or send [place] a message through MyChart.   We are unable to tell what your co-pay for medications will be in advance as this is different depending on your insurance coverage. However, we may be  able to find a substitute medication at lower cost or fill out paperwork to get insurance to cover a needed medication.   If a prior authorization is required to get your medication covered by your insurance company, please allow [place] 1-2 business days to complete this process.  Drug prices often vary depending on where the prescription is filled and some pharmacies may offer cheaper prices.  The website www.goodrx.com contains coupons for medications through different pharmacies. The prices here do not account for what the cost may be with help from insurance (it may be cheaper with your insurance), but the website can give you the price if you did not use any insurance.  - You can print the associated coupon and take it with your prescription to the pharmacy.  - You may also stop by our office during regular business hours and pick up a GoodRx coupon card.  - If you need your prescription sent electronically to a different pharmacy, notify our office through [facility] or by phone at [phone] option 4.     Si Usted Necesita Algo Despus de Su Visita  Tambin puede enviarnos un mensaje a travs de Clinical cytogeneticist. Por lo general respondemos a los mensajes de [employer]  en el transcurso de 1 a 2 das hbiles.  Para renovar recetas, por favor pida a su farmacia que se ponga en contacto con nuestra oficina. Franz Jacks de fax es [place] [phone].  Si tiene un asunto urgente cuando la clnica est cerrada y que no puede esperar hasta el siguiente da hbil, puede llamar/localizar a su doctor(a) al nmero que aparece a continuacin.   Por favor, tenga en cuenta que aunque hacemos todo lo posible para estar disponibles para asuntos urgentes fuera del horario de [place], no estamos disponibles las 24 horas del da, los 7 [address]  [address] de la [place].   Si tiene un problema urgente y no puede comunicarse con nosotros, puede optar por buscar atencin mdica  en el consultorio de su doctor(a), en una clnica  privada, en un centro de atencin urgente o en una sala de emergencias.  Si tiene Engineer, drilling, por favor llame inmediatamente al 911 o vaya a la sala de emergencias.  Nmeros de bper  - Dr. Bary Likes: [phone]  - Dra. Annette Barters: [phone]  - Dr. Felipe Horton: [phone]   En caso de inclemencias del tiempo, por favor llame a Lajuan Pila principal al [phone] para una actualizacin sobre el [place] de cualquier retraso o cierre.  Consejos para la medicacin en dermatologa: Por favor, guarde las cajas en las que vienen los medicamentos de uso tpico para ayudarle a seguir las instrucciones sobre dnde y cmo usarlos. Las farmacias generalmente imprimen las instrucciones del medicamento slo en las cajas y no directamente en los tubos del [place].   Si su medicamento es muy caro, por favor, pngase en contacto con Bettyjane Brunet llamando al [phone] y presione la opcin 4 o envenos un mensaje a travs de Clinical cytogeneticist.   No podemos decirle cul ser su copago por los medicamentos por adelantado ya que esto es diferente dependiendo de la cobertura de su seguro. Sin embargo, es posible que podamos encontrar un medicamento sustituto a Audiological scientist un formulario para que el seguro cubra el medicamento que se considera necesario.   Si se requiere una autorizacin previa para que su compaa de seguros [place] su medicamento, por favor permtanos de 1 a 2 das hbiles para completar este proceso.  Los precios de los medicamentos varan con frecuencia dependiendo del Environmental consultant de dnde se surte la receta y alguna farmacias pueden ofrecer precios ms baratos.  El sitio web www.goodrx.com tiene cupones para medicamentos de Health and safety inspector. Los precios aqu no tienen en cuenta lo que podra costar con la ayuda del seguro (puede ser ms barato con su seguro), pero el sitio web puede darle el precio si no utiliz Tourist information centre manager.  - Puede imprimir el cupn correspondiente y  llevarlo con su receta a la farmacia.  - Tambin puede pasar por nuestra oficina durante el horario de atencin regular y Education officer, museum una tarjeta de cupones de GoodRx.  - Si necesita que su receta se enve electrnicamente a una farmacia diferente, informe a nuestra oficina a travs de MyChart de [place] o por telfono llamando al [phone] y presione la opcin 4.

## 2023-12-19 NOTE — Progress Notes (Signed)
 Follow-Up Visit   Subjective  Deborah Carey is a 73 y.o. female who presents for the following: Hair thinning for years, but worsened 2 1/2 years after patient had a stroke. Scalp is dry, mild itch. No history of hair thinning in parents.  No recent surgeries, no recent stress, no change in medications, no illness. She also has spots on her neck to have checked.    The following portions of the chart were reviewed this encounter and updated as appropriate: medications, allergies, medical history  Review of Systems:  No other skin or systemic complaints except as noted in HPI or Assessment and Plan.  Objective  Well appearing patient in no apparent distress; mood and affect are within normal limits.  A focused examination was performed of the following areas: Face, scalp, chest  Relevant physical exam findings are noted in the Assessment and Plan.  Scalp Diffuse thinning of the crown and widening of the midline part with recession of the frontal hairline; follicular hyperkeratosis and mild erythema with lonely hairs at frontal hairline. Eyebrows absent, eyelashes thin.   sternum and left upper chest x 6 (6) Stuck-on, waxy, tan-brown macules--Discussed benign etiology and prognosis.    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  Chronic and persistent condition with duration or expected duration over one year. Condition is bothersome/symptomatic for patient. Currently flared.   Frontal fibrosing alopecia (FFA) is a chronic/progressive and irreversible patterned form of scarring alopecia that presents as a symmetric band-like zone of hair loss along the anterior hairline. The eyebrows are often thinned or absent. It is considered a variant of lichen planopilaris in a patterned distribution and is more common in women. Therapies may stop or slow progression but generally do not lead to hair regrowth.  Female Androgenic Alopecia is a  chronic condition related to genetics and/or hormonal changes.  In women androgenetic alopecia is commonly associated with menopause but may occur any time after puberty.  It causes hair thinning primarily on the crown with widening of the part and temporal hairline recession.  Can use OTC Rogaine (minoxidil) 5% solution/foam as directed.  Oral treatments in female patients who have no contraindication may include : - Low dose oral minoxidil 1.25 - 5mg  daily - Spironolactone 50 - 100mg  bid - Finasteride 2.5 - 5 mg daily Adjunctive therapies include: - Low Level Laser Light Therapy (LLLT) - Platelet-rich plasma injections (PRP) - Hair Transplants or scalp reduction  Discussed oral medications, topical solutions, ILK injections.  Start Minoxidil 2.5 MG take 1/2 tablet by mouth daily. If no side effects after 1 month, increase to 1 full tablet. Dsp #90 1Rf. Doses of oral minoxidil for hair loss are considered 'low dose'. This is because the doses used for hair loss are much lower than the doses which are used for conditions such as high blood pressure (hypertension). The doses used for hypertension are 10-40mg  per day.  Side effects are uncommon at the low doses (up to 2.5 mg/day) used to treat hair loss. Potential side effects, more commonly seen at higher doses, include: Increase in hair growth (hypertrichosis) elsewhere on face and body Temporary hair shedding upon starting medication which may last up to 4 weeks Ankle swelling, fluid retention, rapid weight gain more than 5 pounds Low blood pressure and feeling lightheaded or dizzy when standing up quickly Fast or irregular heartbeat Headaches  Start Finasteride 5 MG take 1/2 tablet by mouth daily. If no side effects, increase  to full tablet daily. Dsp #90 1Rf.  Start betamethasone dipropionate lotion Apply to frontal hairline at bedtime dsp 45 mL 3Rf. Avoid face.  Topical steroids (such as triamcinolone, fluocinolone, fluocinonide,  mometasone, clobetasol, halobetasol, betamethasone, hydrocortisone) can cause thinning and lightening of the skin if they are used for too long in the same area. Your physician has selected the right strength medicine for your problem and area affected on the body. Please use your medication only as directed by your physician to prevent side effects.   Start ketoconazole 2% shampoo Apply 2x/week, let sit several minutes before rinsing dsp 120 mL   SEBORRHEIC KERATOSIS (6) sternum and left upper chest x 6 (6) vs Lentigines.  Reassured benign age-related growth. Discussed cosmetic procedure cryotherapy, noncovered.  $60 for 1st lesion and $15 for each additional lesion if done on the same day.  Maximum charge $350.  One touch-up treatment included no charge. Discussed risks of treatment including dyspigmentation, small scar, and/or recurrence. Recommend daily broad spectrum sunscreen SPF 30+/photoprotection to treated areas once healed.  Collect $130, cosmetic cryotherapy x 6.  Destruction of lesion - sternum and left upper chest x 6 (6)  Destruction method: cryotherapy   Informed consent: discussed and consent obtained   Lesion destroyed using liquid nitrogen: Yes   Region frozen until ice ball extended beyond lesion: Yes   Outcome: patient tolerated procedure well with no complications   Post-procedure details: wound care instructions given   Additional details:  Prior to procedure, discussed risks of blister formation, small wound, skin dyspigmentation, or rare scar following cryotherapy. Recommend Vaseline ointment to treated areas while healing.  LENTIGO Exam: central upper lip with 4 mm pink tan macule, no scale or bleeding per pt Due to sun exposure  Treatment Plan: Benign-appearing, observe. Recheck on f/up. Recommend daily broad spectrum sunscreen SPF 30+ to sun-exposed areas, reapply every 2 hours as needed.  Call for any changes.  Return in about 3 months (around 03/19/2024) for  FFA.  IBernardine Bridegroom, CMA, am acting as scribe for Artemio Larry, MD .   Documentation: I have reviewed the above documentation for accuracy and completeness, and I agree with the above.  Artemio Larry, MD

## 2023-12-21 ENCOUNTER — Other Ambulatory Visit: Payer: Self-pay | Admitting: Family Medicine

## 2024-02-17 ENCOUNTER — Other Ambulatory Visit: Payer: Self-pay | Admitting: Family Medicine

## 2024-03-19 ENCOUNTER — Ambulatory Visit: Admitting: Dermatology

## 2024-03-19 DIAGNOSIS — L649 Androgenic alopecia, unspecified: Secondary | ICD-10-CM | POA: Diagnosis not present

## 2024-03-19 DIAGNOSIS — Z79899 Other long term (current) drug therapy: Secondary | ICD-10-CM | POA: Diagnosis not present

## 2024-03-19 DIAGNOSIS — L570 Actinic keratosis: Secondary | ICD-10-CM

## 2024-03-19 DIAGNOSIS — W908XXA Exposure to other nonionizing radiation, initial encounter: Secondary | ICD-10-CM

## 2024-03-19 DIAGNOSIS — L820 Inflamed seborrheic keratosis: Secondary | ICD-10-CM | POA: Diagnosis not present

## 2024-03-19 DIAGNOSIS — L6612 Frontal fibrosing alopecia: Secondary | ICD-10-CM | POA: Diagnosis not present

## 2024-03-19 MED ORDER — CICLOPIROX 1 % EX SHAM: MEDICATED_SHAMPOO | CUTANEOUS | 5 refills | Status: DC

## 2024-03-19 MED ORDER — TACROLIMUS 0.1 % EX OINT: TOPICAL_OINTMENT | CUTANEOUS | 1 refills | Status: DC

## 2024-03-19 NOTE — Progress Notes (Signed)
 Follow-Up Visit   Subjective  Deborah Carey is a 73 y.o. female who presents for the following: FFA of the scalp. She is taking minoxidil  2.5mg , finasteride  5mg , using ketoconazole  shampoo, and betamethasone  lotion at hairline. No side effects from meds other than increased hair growth on face. Recheck lentigo at central upper lip- stays scaly.  She has a couple of spots to check on the back and thigh, picks at.   The following portions of the chart were reviewed this encounter and updated as appropriate: medications, allergies, medical history  Review of Systems:  No other skin or systemic complaints except as noted in HPI or Assessment and Plan.  Objective  Well appearing patient in no apparent distress; mood and affect are within normal limits.  A focused examination was performed of the following areas: Face, scalp, back, right leg  Relevant exam findings are noted in the Assessment and Plan.  spinal mid back x 1, right thigh x 1 (2) Erythematous stuck-on, waxy papule central upper lip Pink brown scaly macule.  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   Exam: Diffuse thinning of the crown and widening of the midline part with recession of the frontal hairline; follicular hyperkeratosis and mild erythema with lonely hairs at frontal hairline. Eyebrows absent, eyelashes thin.   Chronic and persistent condition with duration or expected duration over one year. Condition is symptomatic/ bothersome to patient. Not currently at goal.    Frontal fibrosing alopecia (FFA) is a chronic/progressive and irreversible patterned form of scarring alopecia that presents as a symmetric band-like zone of hair loss along the anterior hairline. The eyebrows are often thinned or absent. It is considered a variant of lichen planopilaris in a patterned distribution and is more common in women. Therapies may stop or slow progression but generally do  not lead to hair regrowth.   Female Androgenic Alopecia is a chronic condition related to genetics and/or hormonal changes.  In women androgenetic alopecia is commonly associated with menopause but may occur any time after puberty.  It causes hair thinning primarily on the crown with widening of the part and temporal hairline recession.  Can use OTC Rogaine  (minoxidil ) 5% solution/foam as directed.  Oral treatments in female patients who have no contraindication may include : - Low dose oral minoxidil  1.25 - 5mg  daily - Spironolactone 50 - 100mg  bid - Finasteride  2.5 - 5 mg daily Adjunctive therapies include: - Low Level Laser Light Therapy (LLLT) - Platelet-rich plasma injections (PRP) - Hair Transplants or scalp reduction  Treatment: BP 122/76 Continue Minoxidil  2.5 MG take 1 tablet by mouth daily.   Doses of oral minoxidil  for hair loss are considered 'low dose'. This is because the doses used for hair loss are much lower than the doses which are used for conditions such as high blood pressure (hypertension). The doses used for hypertension are 10-40mg  per day.  Side effects are uncommon at the low doses (up to 2.5 mg/day) used to treat hair loss. Potential side effects, more commonly seen at higher doses, include: Increase in hair growth (hypertrichosis) elsewhere on face and body Temporary hair shedding upon starting medication which may last up to 4 weeks Ankle swelling, fluid retention, rapid weight gain more than 5 pounds Low blood pressure and feeling lightheaded or dizzy when standing up quickly Fast or irregular heartbeat Headaches   Continue Finasteride  5 MG take 1 tablet by mouth daily. Start tacrolimus  0.1% ointment at frontal  hairline qhs  Continue betamethasone  dipropionate lotion Apply just behind tacrolimus  at scalp/frontal hairline at bedtime. Avoid face.   Topical steroids (such as triamcinolone , fluocinolone, fluocinonide, mometasone , clobetasol, halobetasol,  betamethasone , hydrocortisone) can cause thinning and lightening of the skin if they are used for too long in the same area. Your physician has selected the right strength medicine for your problem and area affected on the body. Please use your medication only as directed by your physician to prevent side effects.    d/c ketoconazole  2% shampoo (pt doesn't like) and start Ciclopirox  shampoo massage into scalp 2x/week, let sit several minutes before rinsing.    Patient states her feet have been swelling more since starting oral medications. Discussed decreasing pills to 1/2 tablet daily, patient defers today. No history of heart racing or palpitations, lightheaded-ness/dizziness. BP normal. Discussed wearing compression stockings/socks to decrease swelling.    INFLAMED SEBORRHEIC KERATOSIS (2) spinal mid back x 1, right thigh x 1 (2) Symptomatic, irritating, patient would like treated. Destruction of lesion - spinal mid back x 1, right thigh x 1 (2)  Destruction method: cryotherapy   Informed consent: discussed and consent obtained   Lesion destroyed using liquid nitrogen: Yes   Region frozen until ice ball extended beyond lesion: Yes   Outcome: patient tolerated procedure well with no complications   Post-procedure details: wound care instructions given   Additional details:  Prior to procedure, discussed risks of blister formation, small wound, skin dyspigmentation, or rare scar following cryotherapy. Recommend Vaseline ointment to treated areas while healing.   AK (ACTINIC KERATOSIS) central upper lip Pigmented.  Actinic keratoses are precancerous spots that appear secondary to cumulative UV radiation exposure/sun exposure over time. They are chronic with expected duration over 1 year. A portion of actinic keratoses will progress to squamous cell carcinoma of the skin. It is not possible to reliably predict which spots will progress to skin cancer and so treatment is recommended to prevent  development of skin cancer.  Recommend daily broad spectrum sunscreen SPF 30+ to sun-exposed areas, reapply every 2 hours as needed.  Recommend staying in the shade or wearing long sleeves, sun glasses (UVA+UVB protection) and wide brim hats (4-inch brim around the entire circumference of the hat). Call for new or changing lesions. Destruction of lesion - central upper lip  Destruction method: cryotherapy   Informed consent: discussed and consent obtained   Lesion destroyed using liquid nitrogen: Yes   Region frozen until ice ball extended beyond lesion: Yes   Outcome: patient tolerated procedure well with no complications   Post-procedure details: wound care instructions given   Additional details:  Prior to procedure, discussed risks of blister formation, small wound, skin dyspigmentation, or rare scar following cryotherapy. Recommend Vaseline ointment to treated areas while healing.    Return in about 3 months (around 06/19/2024) for FFA.  I, Eleanor Blush, CMA, am acting as scribe for Rexene Rattler, MD.   Documentation: I have reviewed the above documentation for accuracy and completeness, and I agree with the above.  Rexene Rattler, MD

## 2024-03-19 NOTE — Patient Instructions (Addendum)
 Cryotherapy Aftercare  Wash gently with soap and water everyday.   Apply Vaseline and Band-Aid daily until healed.    Start tacrolimus  ointment nightly to upper forehead at hair line, use in front of betamethasone  lotion to prevent running on face.   Due to recent changes in healthcare laws, you may see results of your pathology and/or laboratory studies on MyChart before the doctors have had a chance to review them. We understand that in some cases there may be results that are confusing or concerning to you. Please understand that not all results are received at the same time and often the doctors may need to interpret multiple results in order to provide you with the best plan of care or course of treatment. Therefore, we ask that you please give us  2 business days to thoroughly review all your results before contacting the office for clarification. Should we see a critical lab result, you will be contacted sooner.   If You Need Anything After Your Visit  If you have any questions or concerns for your doctor, please call our main line at [phone] and press option 4 to reach your doctor's medical assistant. If no one answers, please leave a voicemail as directed and we will return your call as soon as possible. Messages left after 4 pm will be answered the following business day.   You may also send us  a message via MyChart. We typically respond to MyChart messages within 1-2 business days.  For prescription refills, please ask your pharmacy to contact our office. Our fax number is [phone].  If you have an urgent issue when the clinic is closed that cannot wait until the next business day, you can page your doctor at the number below.    Please note that while we do our best to be available for urgent issues outside of office hours, we are not available 24/7.   If you have an urgent issue and are unable to reach us , you may choose to seek medical care at your doctor's office, retail  clinic, urgent care center, or emergency room.  If you have a medical emergency, please immediately call 911 or go to the emergency department.  Pager Numbers  - Dr. Hester: [phone]  - Dr. Jackquline: [phone]  - Dr. Claudene: [phone]   In the event of inclement weather, please call our main line at [phone] for an update on the status of any delays or closures.  Dermatology Medication Tips: Please keep the boxes that topical medications come in in order to help keep track of the instructions about where and how to use these. Pharmacies typically print the medication instructions only on the boxes and not directly on the medication tubes.   If your medication is too expensive, please contact our office at [phone] option 4 or send us  a message through MyChart.   We are unable to tell what your co-pay for medications will be in advance as this is different depending on your insurance coverage. However, we may be able to find a substitute medication at lower cost or fill out paperwork to get insurance to cover a needed medication.   If a prior authorization is required to get your medication covered by your insurance company, please allow us  1-2 business days to complete this process.  Drug prices often vary depending on where the prescription is filled and some pharmacies may offer cheaper prices.  The website www.goodrx.com contains coupons for medications through different pharmacies. The prices here do  not account for what the cost may be with help from insurance (it may be cheaper with your insurance), but the website can give you the price if you did not use any insurance.  - You can print the associated coupon and take it with your prescription to the pharmacy.  - You may also stop by our office during regular business hours and pick up a GoodRx coupon card.  - If you need your prescription sent electronically to a different pharmacy, notify our office through [facility] or by phone at [phone] option 4.     Si Usted Necesita Algo Despus de Su Visita  Tambin puede enviarnos un mensaje a travs de Clinical cytogeneticist. Por lo general respondemos a los mensajes de MyChart en el transcurso de 1 a 2 das hbiles.  Para renovar recetas, por favor pida a su farmacia que se ponga en contacto con nuestra oficina. Randi lakes de fax es [place] [phone].  Si tiene un asunto urgente cuando la clnica est cerrada y que no puede esperar hasta el siguiente da hbil, puede llamar/localizar a su doctor(a) al nmero que aparece a continuacin.   Por favor, tenga en cuenta que aunque hacemos todo lo posible para estar disponibles para asuntos urgentes fuera del horario de [place], no estamos disponibles las 24 horas del da, los 7 [address]  [address] de la [place].   Si tiene un problema urgente y no puede comunicarse con nosotros, puede optar por buscar atencin mdica  en el consultorio de su doctor(a), en una clnica privada, en un centro de atencin urgente o en una sala de emergencias.  Si tiene Engineer, drilling, por favor llame inmediatamente al 911 o vaya a la sala de emergencias.  Nmeros de bper  - Dr. Hester: [phone]  - Dra. Jackquline: [phone]  - Dr. Claudene: [phone]   En caso de inclemencias del tiempo, por favor llame a landry capes principal al [phone] para una actualizacin sobre el [place] de cualquier retraso o cierre.  Consejos para la medicacin en dermatologa: Por favor, guarde las cajas en las que vienen los medicamentos de uso tpico para ayudarle a seguir las instrucciones sobre dnde y cmo usarlos. Las farmacias generalmente imprimen las instrucciones del medicamento slo en las cajas y no directamente en los tubos del [place].   Si su medicamento es muy caro, por favor, pngase en contacto con landry rieger llamando al [phone] y presione la opcin 4 o envenos un mensaje a travs de Clinical cytogeneticist.   No podemos  decirle cul ser su copago por los medicamentos por adelantado ya que esto es diferente dependiendo de la cobertura de su seguro. Sin embargo, es posible que podamos encontrar un medicamento sustituto a Audiological scientist un formulario para que el seguro cubra el medicamento que se considera necesario.   Si se requiere una autorizacin previa para que su compaa de seguros malta su medicamento, por favor permtanos de 1 a 2 das hbiles para completar este proceso.  Los precios de los medicamentos varan con frecuencia dependiendo del Environmental consultant de dnde se surte la receta y alguna farmacias pueden ofrecer precios ms baratos.  El sitio web www.goodrx.com tiene cupones para medicamentos de Health and safety inspector. Los precios aqu no tienen en cuenta lo que podra costar con la ayuda del seguro (puede ser ms barato con su seguro), pero el sitio web puede darle el precio si no utiliz Tourist information centre manager.  - Puede imprimir el cupn correspondiente y llevarlo con su receta a la farmacia.  -  Tambin puede pasar por nuestra oficina durante el horario de atencin regular y Education officer, museum una tarjeta de cupones de GoodRx.  - Si necesita que su receta se enve electrnicamente a una farmacia diferente, informe a nuestra oficina a travs de MyChart de [place] o por telfono llamando al [phone] y presione la opcin 4.

## 2024-03-25 ENCOUNTER — Other Ambulatory Visit: Payer: Self-pay | Admitting: Dermatology

## 2024-03-25 ENCOUNTER — Other Ambulatory Visit: Payer: Self-pay | Admitting: Family Medicine

## 2024-05-06 ENCOUNTER — Telehealth: Payer: Self-pay | Admitting: Family Medicine

## 2024-05-06 DIAGNOSIS — E559 Vitamin D deficiency, unspecified: Secondary | ICD-10-CM

## 2024-05-06 DIAGNOSIS — R7303 Prediabetes: Secondary | ICD-10-CM

## 2024-05-06 DIAGNOSIS — Z8673 Personal history of transient ischemic attack (TIA), and cerebral infarction without residual deficits: Secondary | ICD-10-CM

## 2024-05-06 DIAGNOSIS — Z79899 Other long term (current) drug therapy: Secondary | ICD-10-CM

## 2024-05-06 DIAGNOSIS — E041 Nontoxic single thyroid nodule: Secondary | ICD-10-CM

## 2024-05-06 DIAGNOSIS — E785 Hyperlipidemia, unspecified: Secondary | ICD-10-CM

## 2024-05-06 NOTE — Telephone Encounter (Signed)
-----   Message from Veva JINNY Ferrari sent at 04/23/2024  3:14 PM EDT ----- Regarding: Lab orders for Tue, 9.2.25 Patient is scheduled for CPX labs, please order future labs, Thanks , Veva

## 2024-05-07 ENCOUNTER — Ambulatory Visit: Payer: Self-pay | Admitting: Family Medicine

## 2024-05-07 ENCOUNTER — Other Ambulatory Visit (INDEPENDENT_AMBULATORY_CARE_PROVIDER_SITE_OTHER): Payer: Medicare PPO

## 2024-05-07 DIAGNOSIS — R7303 Prediabetes: Secondary | ICD-10-CM

## 2024-05-07 DIAGNOSIS — E785 Hyperlipidemia, unspecified: Secondary | ICD-10-CM | POA: Diagnosis not present

## 2024-05-07 DIAGNOSIS — E559 Vitamin D deficiency, unspecified: Secondary | ICD-10-CM | POA: Diagnosis not present

## 2024-05-07 DIAGNOSIS — E041 Nontoxic single thyroid nodule: Secondary | ICD-10-CM

## 2024-05-07 LAB — COMPREHENSIVE METABOLIC PANEL WITH GFR
ALT: 11 U/L (ref 0–35)
AST: 14 U/L (ref 0–37)
Albumin: 4.1 g/dL (ref 3.5–5.2)
Alkaline Phosphatase: 74 U/L (ref 39–117)
BUN: 13 mg/dL (ref 6–23)
CO2: 30 meq/L (ref 19–32)
Calcium: 9 mg/dL (ref 8.4–10.5)
Chloride: 104 meq/L (ref 96–112)
Creatinine, Ser: 0.85 mg/dL (ref 0.40–1.20)
GFR: 66.9 mL/min (ref >60.00)
Glucose, Bld: 99 mg/dL (ref 70–99)
Potassium: 4.1 meq/L (ref 3.5–5.1)
Sodium: 142 meq/L (ref 135–145)
Total Bilirubin: 1 mg/dL (ref 0.2–1.2)
Total Protein: 6.4 g/dL (ref 6.0–8.3)

## 2024-05-07 LAB — TSH: TSH: 2.43 u[IU]/mL (ref 0.35–5.50)

## 2024-05-07 LAB — LIPID PANEL
Cholesterol: 108 mg/dL (ref 0–200)
HDL: 41.7 mg/dL (ref >39.00)
LDL Cholesterol: 54 mg/dL (ref 0–99)
NonHDL: 66.56
Total CHOL/HDL Ratio: 3
Triglycerides: 63 mg/dL (ref 0.0–149.0)
VLDL: 12.6 mg/dL (ref 0.0–40.0)

## 2024-05-07 LAB — VITAMIN D 25 HYDROXY (VIT D DEFICIENCY, FRACTURES): VITD: 79.94 ng/mL (ref 30.00–100.00)

## 2024-05-07 LAB — HEMOGLOBIN A1C: Hgb A1c MFr Bld: 6 % (ref 4.6–6.5)

## 2024-05-14 ENCOUNTER — Ambulatory Visit: Payer: Medicare PPO | Admitting: Family Medicine

## 2024-05-14 ENCOUNTER — Encounter: Payer: Self-pay | Admitting: Family Medicine

## 2024-05-14 VITALS — BP 128/62 | HR 58 | Temp 97.7°F | Ht 63.0 in | Wt 179.5 lb

## 2024-05-14 DIAGNOSIS — R7303 Prediabetes: Secondary | ICD-10-CM

## 2024-05-14 DIAGNOSIS — E559 Vitamin D deficiency, unspecified: Secondary | ICD-10-CM

## 2024-05-14 DIAGNOSIS — K219 Gastro-esophageal reflux disease without esophagitis: Secondary | ICD-10-CM | POA: Diagnosis not present

## 2024-05-14 DIAGNOSIS — G479 Sleep disorder, unspecified: Secondary | ICD-10-CM

## 2024-05-14 DIAGNOSIS — I4821 Permanent atrial fibrillation: Secondary | ICD-10-CM

## 2024-05-14 DIAGNOSIS — E785 Hyperlipidemia, unspecified: Secondary | ICD-10-CM

## 2024-05-14 DIAGNOSIS — Z1211 Encounter for screening for malignant neoplasm of colon: Secondary | ICD-10-CM | POA: Diagnosis not present

## 2024-05-14 DIAGNOSIS — Z0000 Encounter for general adult medical examination without abnormal findings: Secondary | ICD-10-CM | POA: Diagnosis not present

## 2024-05-14 DIAGNOSIS — Z23 Encounter for immunization: Secondary | ICD-10-CM

## 2024-05-14 DIAGNOSIS — M8589 Other specified disorders of bone density and structure, multiple sites: Secondary | ICD-10-CM | POA: Diagnosis not present

## 2024-05-14 DIAGNOSIS — E669 Obesity, unspecified: Secondary | ICD-10-CM

## 2024-05-14 MED ORDER — METOPROLOL SUCCINATE ER 25 MG PO TB24: 25.0000 mg | ORAL_TABLET | Freq: Every day | ORAL | 3 refills | Status: AC

## 2024-05-14 MED ORDER — APIXABAN 5 MG PO TABS: 5.0000 mg | ORAL_TABLET | Freq: Two times a day (BID) | ORAL | 1 refills | Status: AC

## 2024-05-14 MED ORDER — TRAZODONE HCL 100 MG PO TABS: 100.0000 mg | ORAL_TABLET | Freq: Every evening | ORAL | 3 refills | Status: AC | PRN

## 2024-05-14 MED ORDER — ATORVASTATIN CALCIUM 40 MG PO TABS: 40.0000 mg | ORAL_TABLET | Freq: Every day | ORAL | 3 refills | Status: AC

## 2024-05-14 NOTE — Assessment & Plan Note (Signed)
 Disc goals for lipids and reasons to control them Rev last labs with pt Rev low sat fat diet in detail Well controlled  LDL 54 Continues atorvastatin  40 mg daily

## 2024-05-14 NOTE — Assessment & Plan Note (Signed)
 Prediabetes  Lab Results  Component Value Date   HGBA1C 6.0 05/07/2024   HGBA1C 5.9 05/01/2023   HGBA1C 6.2 05/04/2022    disc imp of low glycemic diet and wt loss to prevent DM2   Discussed plan to scale back ice cream Also so stop sweetened tea

## 2024-05-14 NOTE — Assessment & Plan Note (Addendum)
 Reviewed health habits including diet and exercise and skin cancer prevention Reviewed appropriate screening tests for age  Also reviewed health mt list, fam hx and immunization status , as well as social and family history   See HPI Labs reviewed and ordered Health Maintenance  Topic Date Due   Zoster (Shingles) Vaccine (1 of 2) Never done   Flu Shot  04/05/2024   DTaP/Tdap/Td vaccine (3 - Td or Tdap) 05/14/2025*   COVID-19 Vaccine (4 - 2025-26 season) 05/30/2026*   Mammogram  10/09/2024   Medicare Annual Wellness Visit  05/14/2025   Colon Cancer Screening  06/27/2026   Pneumococcal Vaccine for age over 64  Completed   DEXA scan (bone density measurement)  Completed   Hepatitis C Screening  Completed   HPV Vaccine  Aged Out   Meningitis B Vaccine  Aged Out  *Topic was postponed. The date shown is not the original due date.    Flu shot today  Discussed fall prevention, supplements and exercise for bone density  Utd derm care PHQ reviewed

## 2024-05-14 NOTE — Progress Notes (Signed)
 Subjective:    Patient ID: Deborah Carey, female    DOB: [birth year], 73 y.o.   MRN: [id number]  HPI  Here for health maintenance exam and to review chronic medical problems   Wt Readings from Last 3 Encounters:  05/14/24 179 lb 8 oz (81.4 kg)  09/12/23 185 lb (83.9 kg)  05/12/23 183 lb 6 oz (83.2 kg)   31.80 kg/m  Vitals:   05/14/24 1100  BP: 128/62  Pulse: (!) 58  Temp: 97.7 F (36.5 C)  SpO2: 99%    Immunization History  Administered Date(s) Administered   Fluad Quad(high Dose 65+) 05/14/2019, 05/29/2020   Fluad Trivalent(High Dose 65+) 05/12/2023   INFLUENZA, HIGH DOSE SEASONAL PF 07/18/2017   Influenza Split 09/27/2011   Influenza,inj,Quad PF,6+ Mos 09/09/2015, 04/29/2016, 05/08/2018   PFIZER Comirnaty(Gray Top)Covid-19 Tri-Sucrose Vaccine 10/10/2019, 10/31/2019   PFIZER(Purple Top)SARS-COV-2 Vaccination 07/27/2020   Pneumococcal Conjugate-13 04/28/2015   Pneumococcal Polysaccharide-23 11/29/2013   Td 01/21/2003   Tdap 11/05/2012    Health Maintenance Due  Topic Date Due   Zoster Vaccines- Shingrix (1 of 2) Never done   Influenza Vaccine  04/05/2024   Flu shot   Shingrix - afraid of  Read somewhere that it increase stroke risk    Mammogram 10/2023  Self breast exam-no lumps   Gyn health No problems    Colon cancer screening  Colonoscopy 06/2019 with 7 y recall   Bone health  Dexa 07/2023 osteopenia  Falls-fell off toilet when she had noro virus (got medical attention) - madison Bonnet  Supplements  Last vitamin D  Lab Results  Component Value Date   VD25OH 79.94 05/07/2024    Exercise  Not much  Feet have been hurting  New shoes and acupuncture help a bit     Utd derm care  FFA of scalp  Minoxidil ,finasteride   Ketoconazole  shampoo Betamethasone  lotion hair line    Mood    05/10/2023    1:03 PM 05/03/2022    9:37 AM 06/17/2021    9:00 AM 05/20/2020    2:47 PM 05/10/2019    3:38 PM  Depression screen PHQ 2/9  Decreased  Interest 0 1 0 0 1  Down, Depressed, Hopeless 0 1 0 1 3  PHQ - 2 Score 0 2 0 1 4  Altered sleeping  3  2 3   Tired, decreased energy  1  0 0  Change in appetite  0  0 0  Feeling bad or failure about yourself   0  0 0  Trouble concentrating  0  0 0  Moving slowly or fidgety/restless  0  0 0  Suicidal thoughts  0  0 0  PHQ-9 Score  6  3 7   Difficult doing work/chores  Extremely dIfficult  Not difficult at all Not difficult at all  Trazodone  for sleep  Good and bad days  Stress level is not too bad   A fib  Cardiology care Eliquis  5 mg bid Metoprolol  xl 25 mg  daily    Hyperlipidemia Lab Results  Component Value Date   CHOL 108 05/07/2024   CHOL 106 05/01/2023   CHOL 113 05/04/2022   Lab Results  Component Value Date   HDL 41.70 05/07/2024   HDL 39.10 05/01/2023   HDL 44.80 05/04/2022   Lab Results  Component Value Date   LDLCALC 54 05/07/2024   LDLCALC 52 05/01/2023   LDLCALC 54 05/04/2022   Lab Results  Component Value Date   TRIG 63.0 05/07/2024  TRIG 73.0 05/01/2023   TRIG 67.0 05/04/2022   Lab Results  Component Value Date   CHOLHDL 3 05/07/2024   CHOLHDL 3 05/01/2023   CHOLHDL 3 05/04/2022   Lab Results  Component Value Date   LDLDIRECT 141.8 10/30/2013   Atorvastatin  40 mg daily  Also eats very well   Prediabetes Lab Results  Component Value Date   HGBA1C 6.0 05/07/2024   HGBA1C 5.9 05/01/2023   HGBA1C 6.2 05/04/2022  Eating ice cream all summer   Craves sweets    History of thyroid  nodule Lab Results  Component Value Date   TSH 2.43 05/07/2024     Lab Results  Component Value Date   NA 142 05/07/2024   K 4.1 05/07/2024   CO2 30 05/07/2024   GLUCOSE 99 05/07/2024   BUN 13 05/07/2024   CREATININE 0.85 05/07/2024   CALCIUM  9.0 05/07/2024   GFR 66.90 05/07/2024   GFRNONAA >60 09/12/2023   Lab Results  Component Value Date   ALT 11 05/07/2024   AST 14 05/07/2024   ALKPHOS 74 05/07/2024   BILITOT 1.0 05/07/2024        Patient Active Problem List   Diagnosis Date Noted   Sleep disorder 05/12/2023   GERD (gastroesophageal reflux disease) 05/04/2022   Skin spots-aging 05/04/2022   Chronic ischemic left MCA stroke 06/18/2021   Cholelithiasis 06/07/2021   Nodule of apex of right lung 06/07/2021   History of CVA (cerebrovascular accident) 05/25/2021   Atrial fibrillation (HCC)    Colon cancer screening 05/14/2019   Vitamin D  deficiency 05/08/2018   Flushing 06/20/2017   H/O fracture of fibula 01/21/2014   Osteopenia 01/07/2014   Family history of osteoporosis 11/29/2013   Estrogen deficiency 11/29/2013   Hyperlipidemia 11/29/2013   Left thyroid  nodule 09/26/2013   Enlarged thyroid  09/16/2013   Primary osteoarthritis of both knees 11/05/2012   Routine general medical examination at a health care facility 09/19/2011   Obesity (BMI 30-39.9) 06/06/2008   Prediabetes 06/06/2008   Past Medical History:  Diagnosis Date   Allergy    allergic rhinitis   Anxiety    Cataract    removed both eyes    Infertility, female    Osteoarthritis of both knees    OA    PONV (postoperative nausea and vomiting)    Post-menopausal bleeding    neg endo bx.   Past Surgical History:  Procedure Laterality Date   cataract surgery  2011   CESAREAN SECTION     times 2   COLONOSCOPY  2010   hems    ENDOMETRIAL BIOPSY     neg for CA cells   FACIAL COSMETIC SURGERY  2006   face lift   FOOT SURGERY  1998   rt heel spur removal   IR CT HEAD LTD  05/25/2021   IR PERCUTANEOUS ART THROMBECTOMY/INFUSION INTRACRANIAL INC DIAG ANGIO  05/25/2021   IR US  GUIDE VASC ACCESS RIGHT  05/25/2021   RADIOLOGY WITH ANESTHESIA N/A 05/25/2021   Procedure: IR WITH ANESTHESIA;  Surgeon: Luverne Aran, MD;  Location: [facility] OR;  Service: Radiology;  Laterality: N/A;   REFRACTIVE SURGERY  08/1999   Social History   Tobacco Use   Smoking status: Never   Smokeless tobacco: Never  Vaping Use   Vaping status: Never Used  Substance  Use Topics   Alcohol use: No    Alcohol/week: 0.0 standard drinks of alcohol   Drug use: No   Family History  Problem Relation Age of Onset  COPD Brother    Stroke Mother    Hypertension Mother    Osteoporosis Mother    Alzheimer's disease Father    Hypertension Father    Osteoporosis Father    Depression Sister    Hypertension Sister    Osteoporosis Sister    Thyroid  disease Sister    Hypothyroidism Sister    Parkinson's disease Sister    Osteoporosis Sister    Hypothyroidism Sister    Breast cancer Neg Hx    Colon polyps Neg Hx    Colon cancer Neg Hx    Esophageal cancer Neg Hx    Rectal cancer Neg Hx    Stomach cancer Neg Hx    No Known Allergies Current Outpatient Medications on File Prior to Visit  Medication Sig Dispense Refill   acetaminophen  (TYLENOL ) 325 MG tablet Take 1-2 tablets (325-650 mg total) by mouth every 4 (four) hours as needed for mild pain.     albuterol  (VENTOLIN  HFA) 108 (90 Base) MCG/ACT inhaler INHALE 2 PUFFS INTO THE LUNGS EVERY 4 (FOUR) HOURS AS NEEDED. 1 each 5   betamethasone  dipropionate 0.05 % lotion Apply to frontal hairline at bedtime, avoid face. 45 mL 3   Ciclopirox  1 % shampoo Massage into scalp 2 times a week, let sit several minutes then rinse out. 120 mL 5   finasteride  (PROSCAR ) 5 MG tablet Take 1 tablet (5 mg total) by mouth daily. 90 tablet 1   ketoconazole  (NIZORAL ) 2 % shampoo Massage into scalp 2x/wk and let sit several minutes before rinsing. 120 mL 3   minoxidil  (LONITEN ) 2.5 MG tablet TAKE ONE TABLET BY MOUTH DAILY 90 tablet 1   mometasone  (ELOCON ) 0.1 % cream Apply 1 Application topically daily as needed (Rash). Qd to bid up to 5 days a week aa itchy rash on upper back until clear, then as needed for flares 50 g 0   tacrolimus  (PROTOPIC ) 0.1 % ointment Apply to upper forehead at hairline at bedtime. 60 g 1   tretinoin  (RETIN-A ) 0.025 % cream Apply topically at bedtime. 45 g 1   No current facility-administered medications  on file prior to visit.    Review of Systems  Constitutional:  Negative for activity change, appetite change, fatigue, fever and unexpected weight change.  HENT:  Negative for congestion, ear pain, rhinorrhea, sinus pressure and sore throat.   Eyes:  Negative for pain, redness and visual disturbance.  Respiratory:  Negative for cough, shortness of breath and wheezing.   Cardiovascular:  Negative for chest pain and palpitations.  Gastrointestinal:  Negative for abdominal pain, blood in stool, constipation and diarrhea.  Endocrine: Negative for polydipsia and polyuria.  Genitourinary:  Negative for dysuria, frequency and urgency.  Musculoskeletal:  Positive for arthralgias. Negative for back pain and myalgias.  Skin:  Negative for pallor and rash.  Allergic/Immunologic: Negative for environmental allergies.  Neurological:  Negative for dizziness, syncope and headaches.  Hematological:  Negative for adenopathy. Does not bruise/bleed easily.  Psychiatric/Behavioral:  Negative for decreased concentration and dysphoric mood. The patient is not nervous/anxious.        Objective:   Physical Exam Constitutional:      General: She is not in acute distress.    Appearance: Normal appearance. She is well-developed. She is obese. She is not ill-appearing or diaphoretic.  HENT:     Head: Normocephalic and atraumatic.     Right Ear: Tympanic membrane, ear canal and external ear normal.     Left Ear: Tympanic membrane, ear  canal and external ear normal.     Nose: Nose normal. No congestion.     Mouth/Throat:     Mouth: Mucous membranes are moist.     Pharynx: Oropharynx is clear. No posterior oropharyngeal erythema.  Eyes:     General: No scleral icterus.    Extraocular Movements: Extraocular movements intact.     Conjunctiva/sclera: Conjunctivae normal.     Pupils: Pupils are equal, round, and reactive to light.  Neck:     Thyroid : No thyromegaly.     Vascular: No carotid bruit or JVD.   Cardiovascular:     Rate and Rhythm: Normal rate and regular rhythm.     Pulses: Normal pulses.     Heart sounds: Normal heart sounds.     No gallop.  Pulmonary:     Effort: Pulmonary effort is normal. No respiratory distress.     Breath sounds: Normal breath sounds. No wheezing.     Comments: Good air exch Chest:     Chest wall: No tenderness.  Abdominal:     General: Bowel sounds are normal. There is no distension or abdominal bruit.     Palpations: Abdomen is soft. There is no mass.     Tenderness: There is no abdominal tenderness.     Hernia: No hernia is present.  Genitourinary:    Comments: Breast exam: No mass, nodules, thickening, tenderness, bulging, retraction, inflamation, nipple discharge or skin changes noted.  No axillary or clavicular LA.     Musculoskeletal:        General: No tenderness. Normal range of motion.     Cervical back: Normal range of motion and neck supple. No rigidity. No muscular tenderness.     Right lower leg: No edema.     Left lower leg: No edema.     Comments: No kyphosis   Lymphadenopathy:     Cervical: No cervical adenopathy.  Skin:    General: Skin is warm and dry.     Coloration: Skin is not pale.     Findings: No erythema or rash.     Comments: Fair  Solar lentigines diffusely Few sks   Neurological:     Mental Status: She is alert. Mental status is at baseline.     Cranial Nerves: No cranial nerve deficit.     Motor: No abnormal muscle tone.     Coordination: Coordination normal.     Gait: Gait normal.     Deep Tendon Reflexes: Reflexes are normal and symmetric. Reflexes normal.  Psychiatric:        Mood and Affect: Mood normal.        Cognition and Memory: Cognition and memory normal.           Assessment & Plan:   Problem List Items Addressed This Visit       Cardiovascular and Mediastinum   Atrial fibrillation (HCC)   In permanent a fib Sees cardiology  On eliquis   No symptoms  Blood pressure and pulse are  stable   Continues  Metoprolol  xl 25 mg daily  Eliquis  5 mg bid       Relevant Medications   atorvastatin  (LIPITOR) 40 MG tablet   apixaban  (ELIQUIS ) 5 MG TABS tablet   metoprolol  succinate (TOPROL -XL) 25 MG 24 hr tablet     Digestive   GERD (gastroesophageal reflux disease)     Musculoskeletal and Integument   Osteopenia   Dexa 07/2023  Osteopenia  Fall -when dehydrated/otherwise balance is good  No fractures  D level is normal  Discussed fall prevention, supplements and exercise for bone density          Other   Vitamin D  deficiency   Routine general medical examination at a health care facility - Primary   Reviewed health habits including diet and exercise and skin cancer prevention Reviewed appropriate screening tests for age  Also reviewed health mt list, fam hx and immunization status , as well as social and family history   See HPI Labs reviewed and ordered Health Maintenance  Topic Date Due   Zoster (Shingles) Vaccine (1 of 2) Never done   Flu Shot  04/05/2024   DTaP/Tdap/Td vaccine (3 - Td or Tdap) 05/14/2025*   COVID-19 Vaccine (4 - 2025-26 season) 05/30/2026*   Mammogram  10/09/2024   Medicare Annual Wellness Visit  05/14/2025   Colon Cancer Screening  06/27/2026   Pneumococcal Vaccine for age over 67  Completed   DEXA scan (bone density measurement)  Completed   Hepatitis C Screening  Completed   HPV Vaccine  Aged Out   Meningitis B Vaccine  Aged Out  *Topic was postponed. The date shown is not the original due date.    Flu shot today  Discussed fall prevention, supplements and exercise for bone density  Utd derm care       Prediabetes   Prediabetes  Lab Results  Component Value Date   HGBA1C 6.0 05/07/2024   HGBA1C 5.9 05/01/2023   HGBA1C 6.2 05/04/2022    disc imp of low glycemic diet and wt loss to prevent DM2   Discussed plan to scale back ice cream Also so stop sweetened tea       Obesity (BMI 30-39.9)   Discussed how this  problem influences overall health and the risks it imposes  Reviewed plan for weight loss with lower calorie diet (via better food choices (lower glycemic and portion control) along with exercise building up to or more than 30 minutes 5 days per week including some aerobic activity and strength training         Hyperlipidemia   Disc goals for lipids and reasons to control them Rev last labs with pt Rev low sat fat diet in detail Well controlled  LDL 54 Continues atorvastatin  40 mg daily       Relevant Medications   atorvastatin  (LIPITOR) 40 MG tablet   apixaban  (ELIQUIS ) 5 MG TABS tablet   metoprolol  succinate (TOPROL -XL) 25 MG 24 hr tablet   Colon cancer screening   Colonoscopy 2020 - 7 y recall if healthy enough at that age

## 2024-05-14 NOTE — Assessment & Plan Note (Signed)
 Discussed how this problem influences overall health and the risks it imposes  Reviewed plan for weight loss with lower calorie diet (via better food choices (lower glycemic and portion control) along with exercise building up to or more than 30 minutes 5 days per week including some aerobic activity and strength training

## 2024-05-14 NOTE — Assessment & Plan Note (Signed)
 Trazodone  100 mg at bedtime prn

## 2024-05-14 NOTE — Assessment & Plan Note (Signed)
 Last vitamin D  Lab Results  Component Value Date   VD25OH 79.94 05/07/2024   Vitamin D  level is therapeutic with current supplementation Disc importance of this to bone and overall health

## 2024-05-14 NOTE — Assessment & Plan Note (Signed)
 In permanent a fib Sees cardiology  On eliquis   No symptoms  Blood pressure and pulse are stable   Continues  Metoprolol  xl 25 mg daily  Eliquis  5 mg bid

## 2024-05-14 NOTE — Assessment & Plan Note (Signed)
 Dexa 07/2023  Osteopenia  Fall -when dehydrated/otherwise balance is good  No fractures  D level is normal  Discussed fall prevention, supplements and exercise for bone density

## 2024-05-14 NOTE — Assessment & Plan Note (Signed)
Colonoscopy 2020 - 7 y recall if healthy enough at that age

## 2024-05-14 NOTE — Patient Instructions (Addendum)
 If you are interested in the new shingles vaccine (Shingrix) - call your local pharmacy to check on coverage and availability   Ask your pharmacist about what you read   You are also due for tetanus shot (at the pharmacy)   Flu shot here today    Add some strength training to your routine, this is important for bone and brain health and can reduce your risk of falls and help your body use insulin  properly and regulate weight  Light weights, exercise bands , and internet videos are a good way to start  Yoga (chair or regular), machines , floor exercises or a gym with machines are also good options   To prevent diabetes  Avoid added sugars  Try to get most of your carbohydrates from produce (with the exception of white potatoes) and whole grains Eat less bread/pasta/rice/snack foods/cereals/sweets and other items from the middle of the grocery store (processed carbs)

## 2024-05-23 ENCOUNTER — Ambulatory Visit (INDEPENDENT_AMBULATORY_CARE_PROVIDER_SITE_OTHER)

## 2024-05-23 VITALS — BP 128/62 | Ht 63.0 in | Wt 179.0 lb

## 2024-05-23 DIAGNOSIS — Z0000 Encounter for general adult medical examination without abnormal findings: Secondary | ICD-10-CM

## 2024-05-23 NOTE — Patient Instructions (Signed)
 Deborah Carey,  Thank you for taking the time for your Medicare Wellness Visit. I appreciate your continued commitment to your health goals. Please review the care plan we discussed, and feel free to reach out if I can assist you further.  Medicare recommends these wellness visits once per year to help you and your care team stay ahead of potential health issues. These visits are designed to focus on prevention, allowing your provider to concentrate on managing your acute and chronic conditions during your regular appointments.  Please note that Annual Wellness Visits do not include a physical exam. Some assessments may be limited, especially if the visit was conducted virtually. If needed, we may recommend a separate in-person follow-up with your provider.  Ongoing Care Seeing your primary care provider every 3 to 6 months helps us  monitor your health and provide consistent, personalized care.   Referrals If a referral was made during today's visit and you haven't received any updates within two weeks, please contact the referred provider directly to check on the status.  Recommended Screenings:  Health Maintenance  Topic Date Due   Zoster (Shingles) Vaccine (1 of 2) 08/13/2024*   DTaP/Tdap/Td vaccine (3 - Td or Tdap) 05/14/2025*   COVID-19 Vaccine (4 - 2025-26 season) 05/30/2026*   Breast Cancer Screening  10/09/2024   Medicare Annual Wellness Visit  05/14/2025   Colon Cancer Screening  06/27/2026   Pneumococcal Vaccine for age over 46  Completed   Flu Shot  Completed   DEXA scan (bone density measurement)  Completed   Hepatitis C Screening  Completed   HPV Vaccine  Aged Out   Meningitis B Vaccine  Aged Out  *Topic was postponed. The date shown is not the original due date.       05/23/2024    2:01 PM  Advanced Directives  Does Patient Have a Medical Advance Directive? Yes  Type of Estate agent of [place];Living will  Does patient want to make changes to  medical advance directive? No - Patient declined  Copy of Healthcare Power of Attorney in Chart? Yes - validated most recent copy scanned in chart (See row information)   Advance Care Planning is important because it: Ensures you receive medical care that aligns with your values, goals, and preferences. Provides guidance to your family and loved ones, reducing the emotional burden of decision-making during critical moments.  Vision: Annual vision screenings are recommended for early detection of glaucoma, cataracts, and diabetic retinopathy. These exams can also reveal signs of chronic conditions such as diabetes and high blood pressure.  Dental: Annual dental screenings help detect early signs of oral cancer, gum disease, and other conditions linked to overall health, including heart disease and diabetes.  Please see the attached documents for additional preventive care recommendations.

## 2024-05-23 NOTE — Progress Notes (Signed)
 Because this visit was a virtual/telehealth visit,  certain criteria was not obtained, such a blood pressure, CBG if applicable, and timed get up and go. Any medications not marked as taking were not mentioned during the medication reconciliation part of the visit. Any vitals not documented were not able to be obtained due to this being a telehealth visit or patient was unable to self-report a recent blood pressure reading due to a lack of equipment at home via telehealth. Vitals that have been documented are verbally provided by the patient.   This visit was performed by a medical professional under my direct supervision. I was immediately available for consultation/collaboration. I have reviewed and agree with the Annual Wellness Visit documentation.  Subjective:   Deborah Carey is a 73 y.o. who presents for a Medicare Wellness preventive visit.  As a reminder, Annual Wellness Visits don't include a physical exam, and some assessments may be limited, especially if this visit is performed virtually. We may recommend an in-person follow-up visit with your provider if needed.  Visit Complete: Virtual I connected with  Deborah Carey on 05/23/24 by a audio enabled telemedicine application and verified that I am speaking with the correct person using two identifiers.  Patient Location: Home  Provider Location: Home Office  I discussed the limitations of evaluation and management by telemedicine. The patient expressed understanding and agreed to proceed.  Vital Signs: Because this visit was a virtual/telehealth visit, some criteria may be missing or patient reported. Any vitals not documented were not able to be obtained and vitals that have been documented are patient reported.  VideoDeclined- This patient declined Librarian, academic. Therefore the visit was completed with audio only.  Persons Participating in Visit: Patient.  AWV Questionnaire: No: Patient Medicare  AWV questionnaire was not completed prior to this visit.  Cardiac Risk Factors include: advanced age (>20men, >52 women);Other (see comment);dyslipidemia, Risk factor comments: afib     Objective:    Today's Vitals   05/23/24 1402  BP: 128/62  Weight: 179 lb (81.2 kg)  Height: 5' 3 (1.6 m)  PainSc: 5    Body mass index is 31.71 kg/m.     05/23/2024    2:01 PM 09/12/2023    1:39 PM 05/10/2023    1:06 PM 06/16/2021    9:15 AM 06/14/2021   10:04 AM 06/01/2021    6:33 PM 06/01/2021    3:50 PM  Advanced Directives  Does Patient Have a Medical Advance Directive? Yes Yes Yes Yes Yes  Yes  Type of Estate agent of [place];Living will Living will Healthcare Power of [place];Living will Healthcare Power of [place];Living will Healthcare Power of [place];Living will  Healthcare Power of [place];Living will  Does patient want to make changes to medical advance directive? No - Patient declined No - Patient declined No - Patient declined No - Patient declined No - Patient declined No - Patient declined   Copy of Healthcare Power of Attorney in Chart? Yes - validated most recent copy scanned in chart (See row information)  Yes - validated most recent copy scanned in chart (See row information) No - copy requested No - copy requested  No - copy requested  Would patient like information on creating a medical advance directive?  No - Patient declined         Current Medications (verified) Outpatient Encounter Medications as of 05/23/2024  Medication Sig   acetaminophen  (TYLENOL ) 325 MG tablet Take 1-2 tablets (325-650 mg  total) by mouth every 4 (four) hours as needed for mild pain.   albuterol  (VENTOLIN  HFA) 108 (90 Base) MCG/ACT inhaler INHALE 2 PUFFS INTO THE LUNGS EVERY 4 (FOUR) HOURS AS NEEDED.   apixaban  (ELIQUIS ) 5 MG TABS tablet Take 1 tablet (5 mg total) by mouth 2 (two) times daily.   atorvastatin  (LIPITOR) 40 MG tablet Take 1 tablet (40 mg total) by mouth daily.    betamethasone  dipropionate 0.05 % lotion Apply to frontal hairline at bedtime, avoid face.   Ciclopirox  1 % shampoo Massage into scalp 2 times a week, let sit several minutes then rinse out.   finasteride  (PROSCAR ) 5 MG tablet Take 1 tablet (5 mg total) by mouth daily.   ketoconazole  (NIZORAL ) 2 % shampoo Massage into scalp 2x/wk and let sit several minutes before rinsing.   metoprolol  succinate (TOPROL -XL) 25 MG 24 hr tablet Take 1 tablet (25 mg total) by mouth daily.   minoxidil  (LONITEN ) 2.5 MG tablet TAKE ONE TABLET BY MOUTH DAILY   mometasone  (ELOCON ) 0.1 % cream Apply 1 Application topically daily as needed (Rash). Qd to bid up to 5 days a week aa itchy rash on upper back until clear, then as needed for flares   tacrolimus  (PROTOPIC ) 0.1 % ointment Apply to upper forehead at hairline at bedtime.   traZODone  (DESYREL ) 100 MG tablet Take 1 tablet (100 mg total) by mouth at bedtime as needed for sleep.   tretinoin  (RETIN-A ) 0.025 % cream Apply topically at bedtime.   No facility-administered encounter medications on file as of 05/23/2024.    Allergies (verified) Patient has no known allergies.   History: Past Medical History:  Diagnosis Date   Allergy    allergic rhinitis   Anxiety    Cataract    removed both eyes    Infertility, female    Osteoarthritis of both knees    OA    PONV (postoperative nausea and vomiting)    Post-menopausal bleeding    neg endo bx.   Past Surgical History:  Procedure Laterality Date   cataract surgery  2011   CESAREAN SECTION     times 2   COLONOSCOPY  2010   hems    ENDOMETRIAL BIOPSY     neg for CA cells   FACIAL COSMETIC SURGERY  2006   face lift   FOOT SURGERY  1998   rt heel spur removal   IR CT HEAD LTD  05/25/2021   IR PERCUTANEOUS ART THROMBECTOMY/INFUSION INTRACRANIAL INC DIAG ANGIO  05/25/2021   IR US  GUIDE VASC ACCESS RIGHT  05/25/2021   RADIOLOGY WITH ANESTHESIA N/A 05/25/2021   Procedure: IR WITH ANESTHESIA;  Surgeon: Luverne Aran, MD;  Location: [facility] OR;  Service: Radiology;  Laterality: N/A;   REFRACTIVE SURGERY  08/1999   Family History  Problem Relation Age of Onset   COPD Brother    Stroke Mother    Hypertension Mother    Osteoporosis Mother    Alzheimer's disease Father    Hypertension Father    Osteoporosis Father    Depression Sister    Hypertension Sister    Osteoporosis Sister    Thyroid  disease Sister    Hypothyroidism Sister    Parkinson's disease Sister    Osteoporosis Sister    Hypothyroidism Sister    Breast cancer Neg Hx    Colon polyps Neg Hx    Colon cancer Neg Hx    Esophageal cancer Neg Hx    Rectal cancer Neg Hx  Stomach cancer Neg Hx    Social History   Socioeconomic History   Marital status: Married    Spouse name: Not on file   Number of children: Not on file   Years of education: Not on file   Highest education level: Bachelor's degree (e.g., BA, AB, BS)  Occupational History   Occupation: retired  Tobacco Use   Smoking status: Never   Smokeless tobacco: Never  Vaping Use   Vaping status: Never Used  Substance and Sexual Activity   Alcohol use: No    Alcohol/week: 0.0 standard drinks of alcohol   Drug use: No   Sexual activity: Yes    Partners: Male    Birth control/protection: Post-menopausal  Other Topics Concern   Not on file  Social History Narrative   Not on file   Social Drivers of Health   Financial Resource Strain: Low Risk  (05/23/2024)   Overall Financial Resource Strain (CARDIA)    Difficulty of Paying Living Expenses: Not hard at all  Food Insecurity: No Food Insecurity (05/23/2024)   Hunger Vital Sign    Worried About Running Out of Food in the Last Year: Never true    Ran Out of Food in the Last Year: Never true  Transportation Needs: No Transportation Needs (05/23/2024)   PRAPARE - Administrator, Civil Service (Medical): No    Lack of Transportation (Non-Medical): No  Physical Activity: Insufficiently Active (05/23/2024)    Exercise Vital Sign    Days of Exercise per Week: 2 days    Minutes of Exercise per Session: 30 min  Stress: Stress Concern Present (05/23/2024)   [employer] of Occupational Health - Occupational Stress Questionnaire    Feeling of Stress: To some extent  Social Connections: Moderately Integrated (05/23/2024)   Social Connection and Isolation Panel    Frequency of Communication with Friends and Family: Three times a week    Frequency of Social Gatherings with Friends and Family: Twice a week    Attends Religious Services: More than 4 times per year    Active Member of [employer] or Organizations: No    Attends Engineer, structural: Never    Marital Status: Married    Tobacco Counseling Counseling given: Not Answered    Clinical Intake:  Pre-visit preparation completed: Yes  Pain : 0-10 Pain Score: 5  Pain Type: Chronic pain Pain Location: Knee Pain Orientation: Right Pain Onset: Today Pain Frequency: Several days a week     BMI - recorded: 31.71 Nutritional Status: BMI > 30  Obese Nutritional Risks: None Diabetes: No  Lab Results  Component Value Date   HGBA1C 6.0 05/07/2024   HGBA1C 5.9 05/01/2023   HGBA1C 6.2 05/04/2022     How often do you need to have someone help you when you read instructions, pamphlets, or other written materials from your doctor or pharmacy?: 1 - Never  Interpreter Needed?: No  Information entered by :: Troye Hiemstra,CMA   Activities of Daily Living     05/23/2024    2:05 PM  In your present state of health, do you have any difficulty performing the following activities:  Hearing? 0  Vision? 0  Difficulty concentrating or making decisions? 0  Walking or climbing stairs? 0  Dressing or bathing? 0  Doing errands, shopping? 0  Preparing Food and eating ? N  Using the Toilet? N  In the past six months, have you accidently leaked urine? N  Do you have problems with loss  of bowel control? Y  Managing your Medications? N   Managing your Finances? N  Housekeeping or managing your Housekeeping? N    Patient Care Team: Tower, Laine LABOR, MD as PCP - General Darliss Rogue, MD as PCP - Cardiology (Cardiology) Rodgers Barnie RAMAN, CNM as Referring Physician (Certified Nurse Midwife) Josue Oneil ORN, DDS as Referring Physician (Dentistry)  I have updated your Care Teams any recent Medical Services you may have received from other providers in the past year.     Assessment:   This is a routine wellness examination for Anarie.  Hearing/Vision screen Hearing Screening - Comments:: No difficulties  Vision Screening - Comments:: No difficulties   Goals Addressed             This Visit's Progress    Patient Stated       Patient wants to learn about knee replacement        Depression Screen     05/23/2024    2:07 PM 05/10/2023    1:03 PM 05/03/2022    9:37 AM 06/17/2021    9:00 AM 05/20/2020    2:47 PM 05/10/2019    3:38 PM 05/03/2018   11:00 AM  PHQ 2/9 Scores  PHQ - 2 Score 0 0 2 0 1 4 0  PHQ- 9 Score 2  6  3 7  0    Fall Risk     05/23/2024    2:04 PM 05/14/2024   10:59 AM 05/10/2023    1:07 PM 05/03/2022    9:40 AM 06/17/2021    9:00 AM  Fall Risk   Falls in the past year? 1 1 0 1 0  Number falls in past yr: 0 1 0 1 0  Injury with Fall? 1 0 0 0 0  Risk for fall due to : No Fall Risks History of fall(s) No Fall Risks No Fall Risks;Impaired balance/gait   Follow up Falls evaluation completed;Falls prevention discussed Falls evaluation completed Falls prevention discussed;Falls evaluation completed Falls evaluation completed       Data saved with a previous flowsheet row definition    MEDICARE RISK AT HOME:  Medicare Risk at Home Any stairs in or around the home?: Yes If so, are there any without handrails?: No Home free of loose throw rugs in walkways, pet beds, electrical cords, etc?: Yes Adequate lighting in your home to reduce risk of falls?: Yes Life alert?: No Use of a cane, walker or  w/c?: No Grab bars in the bathroom?: No Shower chair or bench in shower?: Yes Elevated toilet seat or a handicapped toilet?: Yes  TIMED UP AND GO:  Was the test performed?  No  Cognitive Function: 6CIT completed    05/20/2020    2:51 PM 05/10/2019    3:41 PM 05/03/2018   11:00 AM 05/01/2017    9:58 AM 04/29/2016    8:50 AM  MMSE - Mini Mental State Exam  Orientation to time 5 4 5 5  5    Orientation to Place 5 5 5 5  5    Registration 3 3 3 3  3    Attention/ Calculation 5 5 0 0  0   Recall 3 3 3 3  3    Language- name 2 objects  0 0 0  0   Language- repeat 1 1 1 1 1   Language- follow 3 step command  0 3 3  3    Language- read & follow direction  0 0 0  0   Write  a sentence  0 0 0  0   Copy design  0 0 0  0   Total score  21 20 20  20       Data saved with a previous flowsheet row definition        05/23/2024    2:08 PM 05/10/2023    1:08 PM  6CIT Screen  What Year? 0 points 0 points  What month? 0 points 0 points  What time? 0 points 0 points  Count back from 20 0 points 0 points  Months in reverse 0 points 0 points  Repeat phrase 0 points 0 points  Total Score 0 points 0 points    Immunizations Immunization History  Administered Date(s) Administered   Fluad Quad(high Dose 65+) 05/14/2019, 05/29/2020   Fluad Trivalent(High Dose 65+) 05/12/2023   INFLUENZA, HIGH DOSE SEASONAL PF 07/18/2017, 05/14/2024   Influenza Split 09/27/2011   Influenza,inj,Quad PF,6+ Mos 09/09/2015, 04/29/2016, 05/08/2018   PFIZER Comirnaty(Gray Top)Covid-19 Tri-Sucrose Vaccine 10/10/2019, 10/31/2019   PFIZER(Purple Top)SARS-COV-2 Vaccination 10/10/2019, 10/31/2019, 07/27/2020   Pneumococcal Conjugate-13 04/28/2015   Pneumococcal Polysaccharide-23 11/29/2013   Td 01/21/2003   Tdap 11/05/2012    Screening Tests Health Maintenance  Topic Date Due   Zoster Vaccines- Shingrix (1 of 2) 08/13/2024 (Originally 09/08/1969)   DTaP/Tdap/Td (3 - Td or Tdap) 05/14/2025 (Originally 11/06/2022)   COVID-19  Vaccine (6 - 2025-26 season) 05/30/2026 (Originally 05/06/2024)   Mammogram  10/09/2024   Medicare Annual Wellness (AWV)  05/23/2025   Colonoscopy  06/27/2026   Pneumococcal Vaccine: 50+ Years  Completed   Influenza Vaccine  Completed   DEXA SCAN  Completed   Hepatitis C Screening  Completed   HPV VACCINES  Aged Out   Meningococcal B Vaccine  Aged Out    Additional Screening:  Vision Screening: Recommended annual ophthalmology exams for early detection of glaucoma and other disorders of the eye. Is the patient up to date with their annual eye exam?  No  Who is the provider or what is the name of the office in which the patient attends annual eye exams?   Dental Screening: Recommended annual dental exams for proper oral hygiene  Community Resource Referral / Chronic Care Management: CRR required this visit?  No   CCM required this visit?  No   Plan:    I have personally reviewed and noted the following in the patient's chart:   Medical and social history Use of alcohol, tobacco or illicit drugs  Current medications and supplements including opioid prescriptions. Patient is not currently taking opioid prescriptions. Functional ability and status Nutritional status Physical activity Advanced directives List of other physicians Hospitalizations, surgeries, and ER visits in previous 12 months Vitals Screenings to include cognitive, depression, and falls Referrals and appointments  In addition, I have reviewed and discussed with patient certain preventive protocols, quality metrics, and best practice recommendations. A written personalized care plan for preventive services as well as general preventive health recommendations were provided to patient.   Lyle MARLA Right, [place]   05/23/2024   After Visit Summary: (MyChart) Due to this being a telephonic visit, the after visit summary with patients personalized plan was offered to patient via MyChart   Notes: Nothing  significant to report at this time.

## 2024-06-11 ENCOUNTER — Ambulatory Visit: Admitting: Dermatology

## 2024-06-15 ENCOUNTER — Other Ambulatory Visit: Payer: Self-pay | Admitting: Dermatology

## 2024-08-14 ENCOUNTER — Ambulatory Visit: Admitting: Dermatology

## 2024-08-14 DIAGNOSIS — L6612 Frontal fibrosing alopecia: Secondary | ICD-10-CM

## 2024-08-14 DIAGNOSIS — L821 Other seborrheic keratosis: Secondary | ICD-10-CM

## 2024-08-14 DIAGNOSIS — L649 Androgenic alopecia, unspecified: Secondary | ICD-10-CM

## 2024-08-14 DIAGNOSIS — L609 Nail disorder, unspecified: Secondary | ICD-10-CM

## 2024-08-14 DIAGNOSIS — Z79899 Other long term (current) drug therapy: Secondary | ICD-10-CM

## 2024-08-14 MED ORDER — CICLOPIROX 1 % EX SHAM: MEDICATED_SHAMPOO | CUTANEOUS | 5 refills | Status: AC

## 2024-08-14 MED ORDER — MINOXIDIL 2.5 MG PO TABS: 2.5000 mg | ORAL_TABLET | Freq: Every day | ORAL | 1 refills | Status: AC

## 2024-08-14 MED ORDER — FINASTERIDE 5 MG PO TABS: 5.0000 mg | ORAL_TABLET | Freq: Every day | ORAL | 1 refills | Status: AC

## 2024-08-14 MED ORDER — TACROLIMUS 0.1 % EX OINT: TOPICAL_OINTMENT | CUTANEOUS | 1 refills | Status: AC

## 2024-08-14 MED ORDER — BETAMETHASONE DIPROPIONATE 0.05 % EX LOTN: TOPICAL_LOTION | CUTANEOUS | 3 refills | Status: AC

## 2024-08-14 NOTE — Patient Instructions (Addendum)
 Continue Finasteride  5 MG take 1 tablet by mouth daily. Continue tacrolimus  0.1% ointment at frontal hairline qhs  Continue betamethasone  dipropionate lotion Apply just behind tacrolimus  at scalp/frontal hairline at bedtime. Avoid face.    Topical steroids (such as triamcinolone , fluocinolone, fluocinonide, mometasone , clobetasol, halobetasol, betamethasone , hydrocortisone) can cause thinning and lightening of the skin if they are used for too long in the same area. Your physician has selected the right strength medicine for your problem and area affected on the body. Please use your medication only as directed by your physician to prevent side effects.    Continue Ciclopirox  shampoo massage into scalp 2x/week, let sit several minutes before rinsing.   Recommend applying OTC DermaNail conditioner to cuticle area of fingernails twice daily to help with chipping/breaking.  May take 3-6 months of regular use to get best result.  Could also add OTC Biotin supplement 2.5 mg daily to help strengthen nails.  Recommend mild soap with handwashing followed by a thick moisturizing cream to fingernails.  May use Cutemol cream,which comes with DermaNail, Neutrogena Norwegian hand cream, or Eucerin cream original.  Due to recent changes in healthcare laws, you may see results of your pathology and/or laboratory studies on MyChart before the doctors have had a chance to review them. We understand that in some cases there may be results that are confusing or concerning to you. Please understand that not all results are received at the same time and often the doctors may need to interpret multiple results in order to provide you with the best plan of care or course of treatment. Therefore, we ask that you please give us  2 business days to thoroughly review all your results before contacting the office for clarification. Should we see a critical lab result, you will be contacted sooner.   If You Need Anything After Your  Visit  If you have any questions or concerns for your doctor, please call our main line at [phone] and press option 4 to reach your doctor's medical assistant. If no one answers, please leave a voicemail as directed and we will return your call as soon as possible. Messages left after 4 pm will be answered the following business day.   You may also send us  a message via MyChart. We typically respond to MyChart messages within 1-2 business days.  For prescription refills, please ask your pharmacy to contact our office. Our fax number is [phone].  If you have an urgent issue when the clinic is closed that cannot wait until the next business day, you can page your doctor at the number below.    Please note that while we do our best to be available for urgent issues outside of office hours, we are not available 24/7.   If you have an urgent issue and are unable to reach us , you may choose to seek medical care at your doctor's office, retail clinic, urgent care center, or emergency room.  If you have a medical emergency, please immediately call 911 or go to the emergency department.  Pager Numbers  - Dr. Hester: [phone]  - Dr. Jackquline: [phone]  - Dr. Claudene: [phone]   - Dr. Raymund: [phone]  In the event of inclement weather, please call our main line at [phone] for an update on the status of any delays or closures.  Dermatology Medication Tips: Please keep the boxes that topical medications come in in order to help keep track of the instructions about where and how to use these. Pharmacies  typically print the medication instructions only on the boxes and not directly on the medication tubes.   If your medication is too expensive, please contact our office at [phone] option 4 or send us  a message through MyChart.   We are unable to tell what your co-pay for medications will be in advance as this is different depending on your insurance coverage.  However, we may be able to find a substitute medication at lower cost or fill out paperwork to get insurance to cover a needed medication.   If a prior authorization is required to get your medication covered by your insurance company, please allow us  1-2 business days to complete this process.  Drug prices often vary depending on where the prescription is filled and some pharmacies may offer cheaper prices.  The website www.goodrx.com contains coupons for medications through different pharmacies. The prices here do not account for what the cost may be with help from insurance (it may be cheaper with your insurance), but the website can give you the price if you did not use any insurance.  - You can print the associated coupon and take it with your prescription to the pharmacy.  - You may also stop by our office during regular business hours and pick up a GoodRx coupon card.  - If you need your prescription sent electronically to a different pharmacy, notify our office through [facility] or by phone at [phone] option 4.     Si Usted Necesita Algo Despus de Su Visita  Tambin puede enviarnos un mensaje a travs de Clinical Cytogeneticist. Por lo general respondemos a los mensajes de MyChart en el transcurso de 1 a 2 das hbiles.  Para renovar recetas, por favor pida a su farmacia que se ponga en contacto con nuestra oficina. Randi lakes de fax es [place] [phone].  Si tiene un asunto urgente cuando la clnica est cerrada y que no puede esperar hasta el siguiente da hbil, puede llamar/localizar a su doctor(a) al nmero que aparece a continuacin.   Por favor, tenga en cuenta que aunque hacemos todo lo posible para estar disponibles para asuntos urgentes fuera del horario de [place], no estamos disponibles las 24 horas del da, los 7 [address]  [phone] de la [place].   Si tiene un problema urgente y no puede comunicarse con nosotros, puede optar por buscar atencin mdica  en el consultorio de su  doctor(a), en una clnica privada, en un centro de atencin urgente o en una sala de emergencias.  Si tiene engineer, drilling, por favor llame inmediatamente al 911 o vaya a la sala de emergencias.  Nmeros de bper  - Dr. Hester: [phone]  - Dra. Jackquline: [phone]  - Dr. Claudene: [phone]  - Dra. Kitts: [phone]  En caso de inclemencias del [place], por favor llame a nuestra lnea principal al [phone] para una actualizacin sobre el estado de cualquier retraso o cierre.  Consejos para la medicacin en dermatologa: Por favor, guarde las cajas en las que vienen los medicamentos de uso tpico para ayudarle a seguir las instrucciones sobre dnde y cmo usarlos. Las farmacias generalmente imprimen las instrucciones del medicamento slo en las cajas y no directamente en los tubos del [place].   Si su medicamento es muy caro, por favor, pngase en contacto con landry rieger llamando al [phone] y presione la opcin 4 o envenos un mensaje a travs de Clinical Cytogeneticist.   No podemos decirle cul ser su copago por los medicamentos por adelantado ya que esto es diferente  dependiendo de la cobertura de su seguro. Sin embargo, es posible que podamos encontrar un medicamento sustituto a audiological scientist un formulario para que el seguro cubra el medicamento que se considera necesario.   Si se requiere una autorizacin previa para que su compaa de seguros cubra su medicamento, por favor permtanos de 1 a 2 das hbiles para completar este proceso.  Los precios de los medicamentos varan con frecuencia dependiendo del environmental consultant de dnde se surte la receta y alguna farmacias pueden ofrecer precios ms baratos.  El sitio web www.goodrx.com tiene cupones para medicamentos de health and safety inspector. Los precios aqu no tienen en cuenta lo que podra costar con la ayuda del seguro (puede ser ms barato con su seguro), pero el sitio web puede darle el precio si no utiliz producer, television/film/video.  - Puede imprimir el cupn correspondiente y llevarlo con su receta a la farmacia.  - Tambin puede pasar por nuestra oficina durante el horario de atencin regular y education officer, museum una tarjeta de cupones de GoodRx.  - Si necesita que su receta se enve electrnicamente a una farmacia diferente, informe a nuestra oficina a travs de MyChart de [place] o por telfono llamando al [phone] y presione la opcin 4.

## 2024-08-14 NOTE — Progress Notes (Signed)
 Follow-Up Visit   Subjective  Deborah Carey is a 73 y.o. female who presents for the following: FFA follow up. Patient currently taking minoxidil  2.5 mg and finasteride  5 mg every day. No side effects, pt tolerating ok. Also using ciclopirox  shampoo, betamethasone  diproprionate lotion and tacrolimus . Patient not sure if there has been any improvement but is not losing as much hair. Some itching at back of scalp but improved.   Patient with a few spots at face and issue with fingernails.  The following portions of the chart were reviewed this encounter and updated as appropriate: medications, allergies, medical history  Review of Systems:  No other skin or systemic complaints except as noted in HPI or Assessment and Plan.  Objective  Well appearing patient in no apparent distress; mood and affect are within normal limits.   A focused examination was performed of the following areas: scalp  Relevant exam findings are noted in the Assessment and Plan.  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   Exam: Diffuse thinning of the crown and widening of the midline part with recession of the frontal hairline; follicular hyperkeratosis and mild erythema with lonely hairs at frontal hairline. Eyebrows absent, eyelashes thin. Photos compared, improved with thickening esp at BL temporal hairline   Chronic and persistent condition with duration or expected duration over one year. Condition is symptomatic/ bothersome to patient. Not currently at goal.    Frontal fibrosing alopecia (FFA) is a chronic/progressive and irreversible patterned form of scarring alopecia that presents as a symmetric band-like zone of hair loss along the anterior hairline. The eyebrows are often thinned or absent. It is considered a variant of lichen planopilaris in a patterned distribution and is more common in women.  Therapies may stop or slow progression but generally do not lead to hair regrowth.   Female Androgenic Alopecia is a chronic condition related to genetics and/or hormonal changes.  In women androgenetic alopecia is commonly associated with menopause but may occur any time after puberty.  It causes hair thinning primarily on the crown with widening of the part and temporal hairline recession.  Can use OTC Rogaine  (minoxidil ) 5% solution/foam as directed.  Oral treatments in female patients who have no contraindication may include : - Low dose oral minoxidil  1.25 - 5mg  daily - Spironolactone 50 - 100mg  bid - Finasteride  2.5 - 5 mg daily Adjunctive therapies include: - Low Level Laser Light Therapy (LLLT) - Platelet-rich plasma injections (PRP) - Hair Transplants or scalp reduction   Treatment: BP 120/59 Continue Minoxidil  2.5 MG take 1 tablet by mouth daily.    Doses of oral minoxidil  for hair loss are considered low dose. This is because the doses used for hair loss are much lower than the doses which are used for conditions such as high blood pressure (hypertension). The doses used for hypertension are 10-40mg  per day.  Side effects are uncommon at the low doses (up to 2.5 mg/day) used to treat hair loss. Potential side effects, more commonly seen at higher doses, include: Increase in hair growth (hypertrichosis) elsewhere on face and body Temporary hair shedding upon starting medication which may last up to 4 weeks Ankle swelling, fluid retention, rapid weight gain more than 5 pounds Low blood pressure and feeling lightheaded or dizzy when standing up quickly Fast or irregular heartbeat Headaches   Continue Finasteride  5 MG take 1 tablet  by mouth daily.  Counseled that finasteride  can decrease libido (sexual drive). Advised it should not be taken by pregnant women or women who could become pregnant. Advised not to donate blood products while taking this medication. Advised if medication  is stopped, they may lose the hair the medication has been helping to grow.   Continue tacrolimus  0.1% ointment at frontal hairline qhs  Continue betamethasone  dipropionate lotion Apply just behind tacrolimus  at scalp/frontal hairline at bedtime. Avoid face.    Topical steroids (such as triamcinolone , fluocinolone, fluocinonide, mometasone , clobetasol, halobetasol, betamethasone , hydrocortisone) can cause thinning and lightening of the skin if they are used for too long in the same area. Your physician has selected the right strength medicine for your problem and area affected on the body. Please use your medication only as directed by your physician to prevent side effects.    Continue Ciclopirox  shampoo massage into scalp 2x/week, let sit several minutes before rinsing.    Discussed ILK injections vs plaquenil. Patient on a lot of other medicines and hx of stroke so prefers not to add another oral medication. She will consider injections if she is worsening.   Long term medication management.  Patient is using long term (months to years) prescription medication  to control their dermatologic condition.  These medications require periodic monitoring to evaluate for efficacy and side effects and may require periodic laboratory monitoring.   NAIL PROBLEM Exam: vertical ridging at nails, likely sue to dryness and possible arthritis at DIP near nail bed  Treatment Plan: Recommend applying OTC DermaNail conditioner to cuticle area of fingernails twice daily to help with chipping/breaking.  May take 3-6 months of regular use to get best result.  Could also add OTC Biotin supplement 2.5 mg daily to help strengthen nails.  Recommend mild soap with handwashing followed by a thick moisturizing cream to fingernails.  May use Cutemol cream,which comes with DermaNail, Neutrogena Norwegian hand cream, or Eucerin cream original.   SEBORRHEIC KERATOSIS right cheek x 3 Patient would like to treat with LN2.  Aware that insurance does not cover and will pay out of pocket.   Discussed cosmetic procedure cryotherapy, noncovered.  $60 for 1st lesion and $15 for each additional lesion if done on the same day.  Maximum charge $350.  One touch-up treatment included no charge. Discussed risks of treatment including dyspigmentation, small scar, and/or recurrence. Recommend daily broad spectrum sunscreen SPF 30+/photoprotection to treated areas once healed.  Destruction of lesion - right cheek x 3  Destruction method: cryotherapy   Informed consent: discussed and consent obtained   Lesion destroyed using liquid nitrogen: Yes   Region frozen until ice ball extended beyond lesion: Yes   Outcome: patient tolerated procedure well with no complications   Post-procedure details: wound care instructions given   Additional details:  Prior to procedure, discussed risks of blister formation, small wound, skin dyspigmentation, or rare scar following cryotherapy. Recommend Vaseline ointment to treated areas while healing.    Return in about 6 months (around 02/12/2025) for Alopecia, with Dr. Jackquline.  LILLETTE Lonell Drones, RMA, am acting as scribe for Rexene Jackquline, MD .   Documentation: I have reviewed the above documentation for accuracy and completeness, and I agree with the above.  Rexene Jackquline, MD

## 2024-09-17 ENCOUNTER — Other Ambulatory Visit: Payer: Self-pay | Admitting: Family Medicine

## 2024-09-17 DIAGNOSIS — I4821 Permanent atrial fibrillation: Secondary | ICD-10-CM

## 2025-02-17 ENCOUNTER — Ambulatory Visit: Admitting: Dermatology

## 2025-05-08 ENCOUNTER — Other Ambulatory Visit

## 2025-05-15 ENCOUNTER — Encounter: Admitting: Family Medicine
